# Patient Record
Sex: Male | Born: 1945
Health system: Southern US, Community
[De-identification: ages and names within clinical notes are randomized; demographics above are authoritative.]

## PROBLEM LIST (undated history)

## (undated) DIAGNOSIS — N529 Male erectile dysfunction, unspecified: Secondary | ICD-10-CM

## (undated) DIAGNOSIS — R001 Bradycardia, unspecified: Secondary | ICD-10-CM

## (undated) DIAGNOSIS — R0600 Dyspnea, unspecified: Secondary | ICD-10-CM

## (undated) DIAGNOSIS — M7500 Adhesive capsulitis of unspecified shoulder: Secondary | ICD-10-CM

## (undated) DIAGNOSIS — Z923 Personal history of irradiation: Secondary | ICD-10-CM

## (undated) DIAGNOSIS — I420 Dilated cardiomyopathy: Secondary | ICD-10-CM

## (undated) DIAGNOSIS — I255 Ischemic cardiomyopathy: Secondary | ICD-10-CM

## (undated) DIAGNOSIS — I4510 Unspecified right bundle-branch block: Secondary | ICD-10-CM

## (undated) DIAGNOSIS — N400 Enlarged prostate without lower urinary tract symptoms: Secondary | ICD-10-CM

## (undated) DIAGNOSIS — I2510 Atherosclerotic heart disease of native coronary artery without angina pectoris: Secondary | ICD-10-CM

## (undated) DIAGNOSIS — C3490 Malignant neoplasm of unspecified part of unspecified bronchus or lung: Secondary | ICD-10-CM

## (undated) DIAGNOSIS — I499 Cardiac arrhythmia, unspecified: Secondary | ICD-10-CM

## (undated) DIAGNOSIS — E785 Hyperlipidemia, unspecified: Secondary | ICD-10-CM

## (undated) DIAGNOSIS — G4733 Obstructive sleep apnea (adult) (pediatric): Secondary | ICD-10-CM

## (undated) DIAGNOSIS — I10 Essential (primary) hypertension: Secondary | ICD-10-CM

## (undated) DIAGNOSIS — F40241 Acrophobia: Secondary | ICD-10-CM

## (undated) HISTORY — PX: COLONOSCOPY: SHX174

## (undated) HISTORY — DX: Hyperlipidemia, unspecified: E78.5

## (undated) HISTORY — DX: Adhesive capsulitis of unspecified shoulder: M75.00

## (undated) HISTORY — DX: Personal history of irradiation: Z92.3

## (undated) HISTORY — DX: Essential (primary) hypertension: I10

## (undated) HISTORY — DX: Atherosclerotic heart disease of native coronary artery without angina pectoris: I25.10

## (undated) HISTORY — DX: Unspecified right bundle-branch block: I45.10

## (undated) HISTORY — DX: Ischemic cardiomyopathy: I25.5

## (undated) HISTORY — DX: Male erectile dysfunction, unspecified: N52.9

## (undated) HISTORY — DX: Acrophobia: F40.241

## (undated) HISTORY — DX: Dilated cardiomyopathy: I42.0

## (undated) HISTORY — DX: Bradycardia, unspecified: R00.1

## (undated) HISTORY — DX: Cardiac arrhythmia, unspecified: I49.9

## (undated) HISTORY — DX: Benign prostatic hyperplasia without lower urinary tract symptoms: N40.0

## (undated) HISTORY — PX: APPENDECTOMY: SHX54

## (undated) HISTORY — PX: CORONARY ARTERY BYPASS GRAFT: SHX141

## (undated) HISTORY — DX: Obstructive sleep apnea (adult) (pediatric): G47.33

---

## 2006-04-19 ENCOUNTER — Inpatient Hospital Stay (HOSPITAL_COMMUNITY): Admission: AD | Admit: 2006-04-19 | Discharge: 2006-04-26 | Payer: Self-pay | Admitting: Cardiology

## 2006-04-19 IMAGING — CR DG CHEST 1V PORT
1 series · 1 of 1 positions shown · non-contrast
Comparison: none

CLINICAL DATA: Unstable angina. Hypertensive crisis.
 PORTABLE CHEST - 1 VIEW ([VF] hours):

[view not recorded]
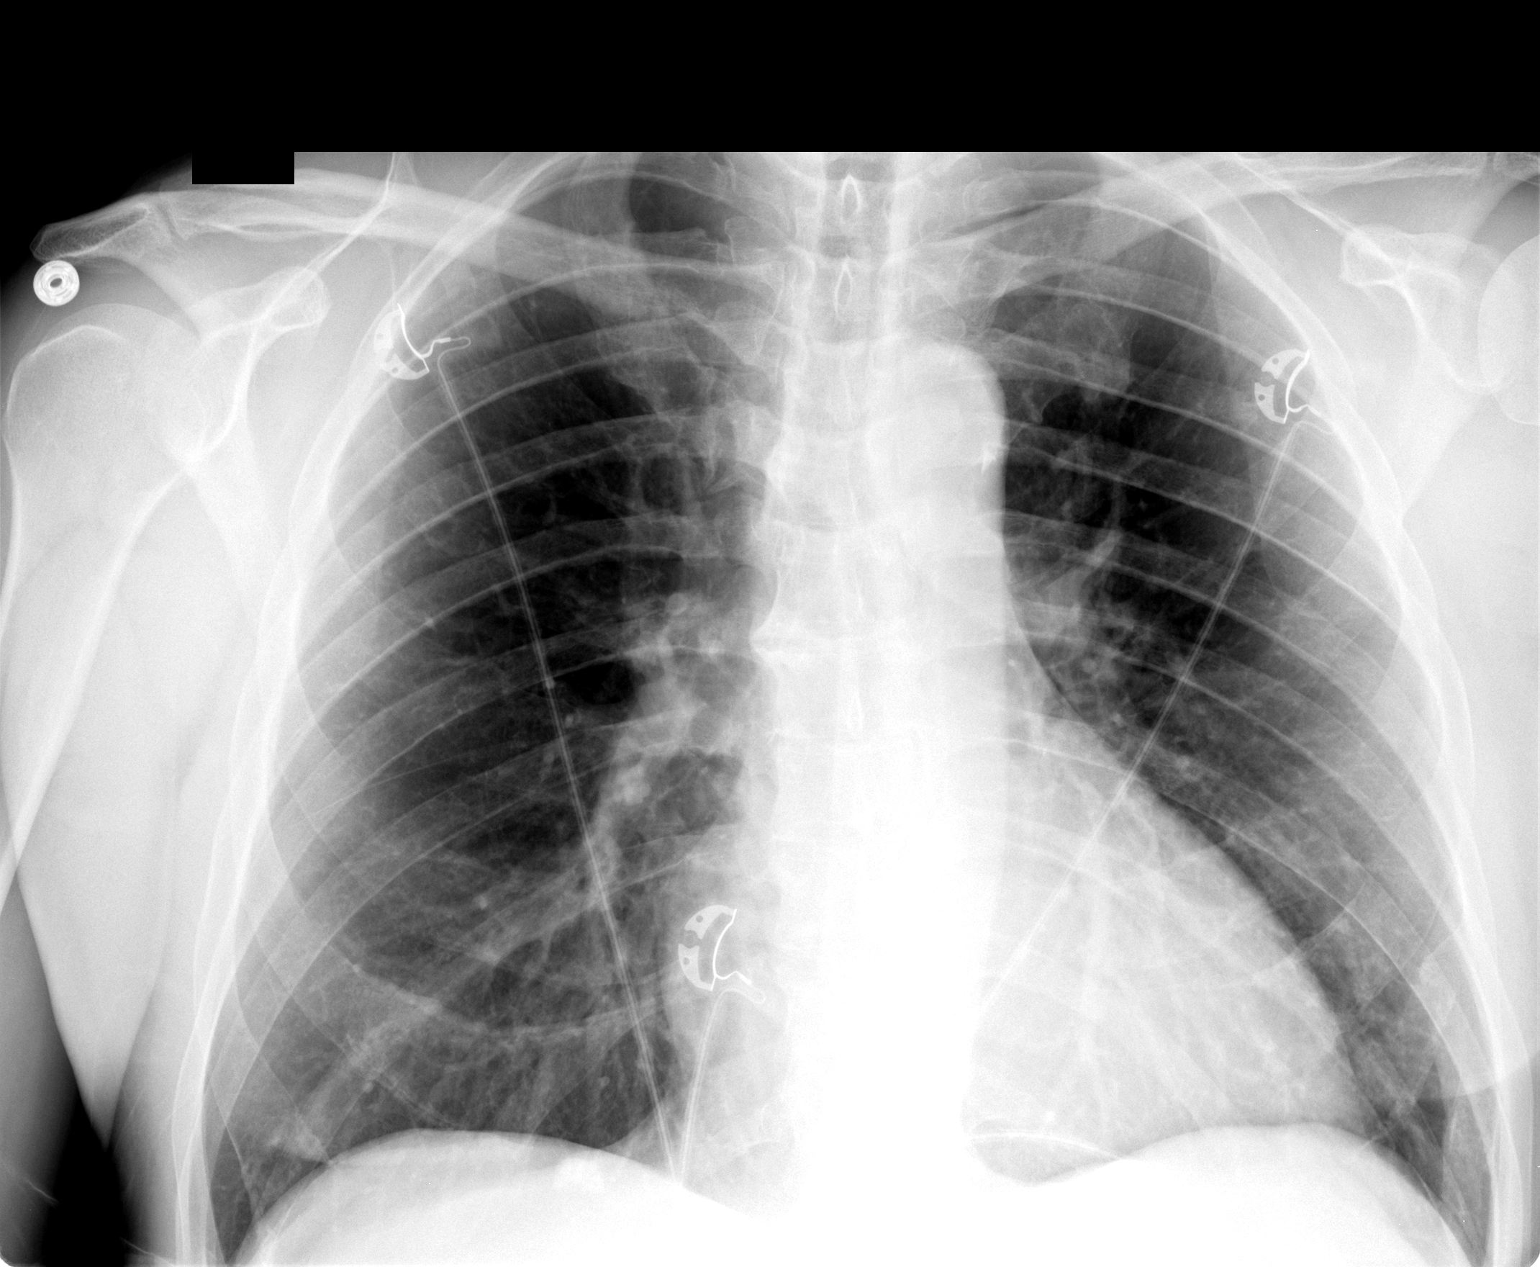

[1 of 1 positions shown; findings below may reference images not displayed]

FINDINGS: The heart is at the upper limits of normal in size.  The mediastinum is unremarkable. The lungs are clear. The vascularity is normal. No effusions. Artifact overlies the chest.
IMPRESSION: No active disease.

## 2006-04-21 IMAGING — CR DG CHEST 1V PORT
1 series · 1 of 1 positions shown · non-contrast
Comparison: [DATE].

CLINICAL DATA: Postop CABG.  
 PORTABLE CHEST - 1 VIEW ? [DATE] ([G1] HOURS):

[view not recorded]
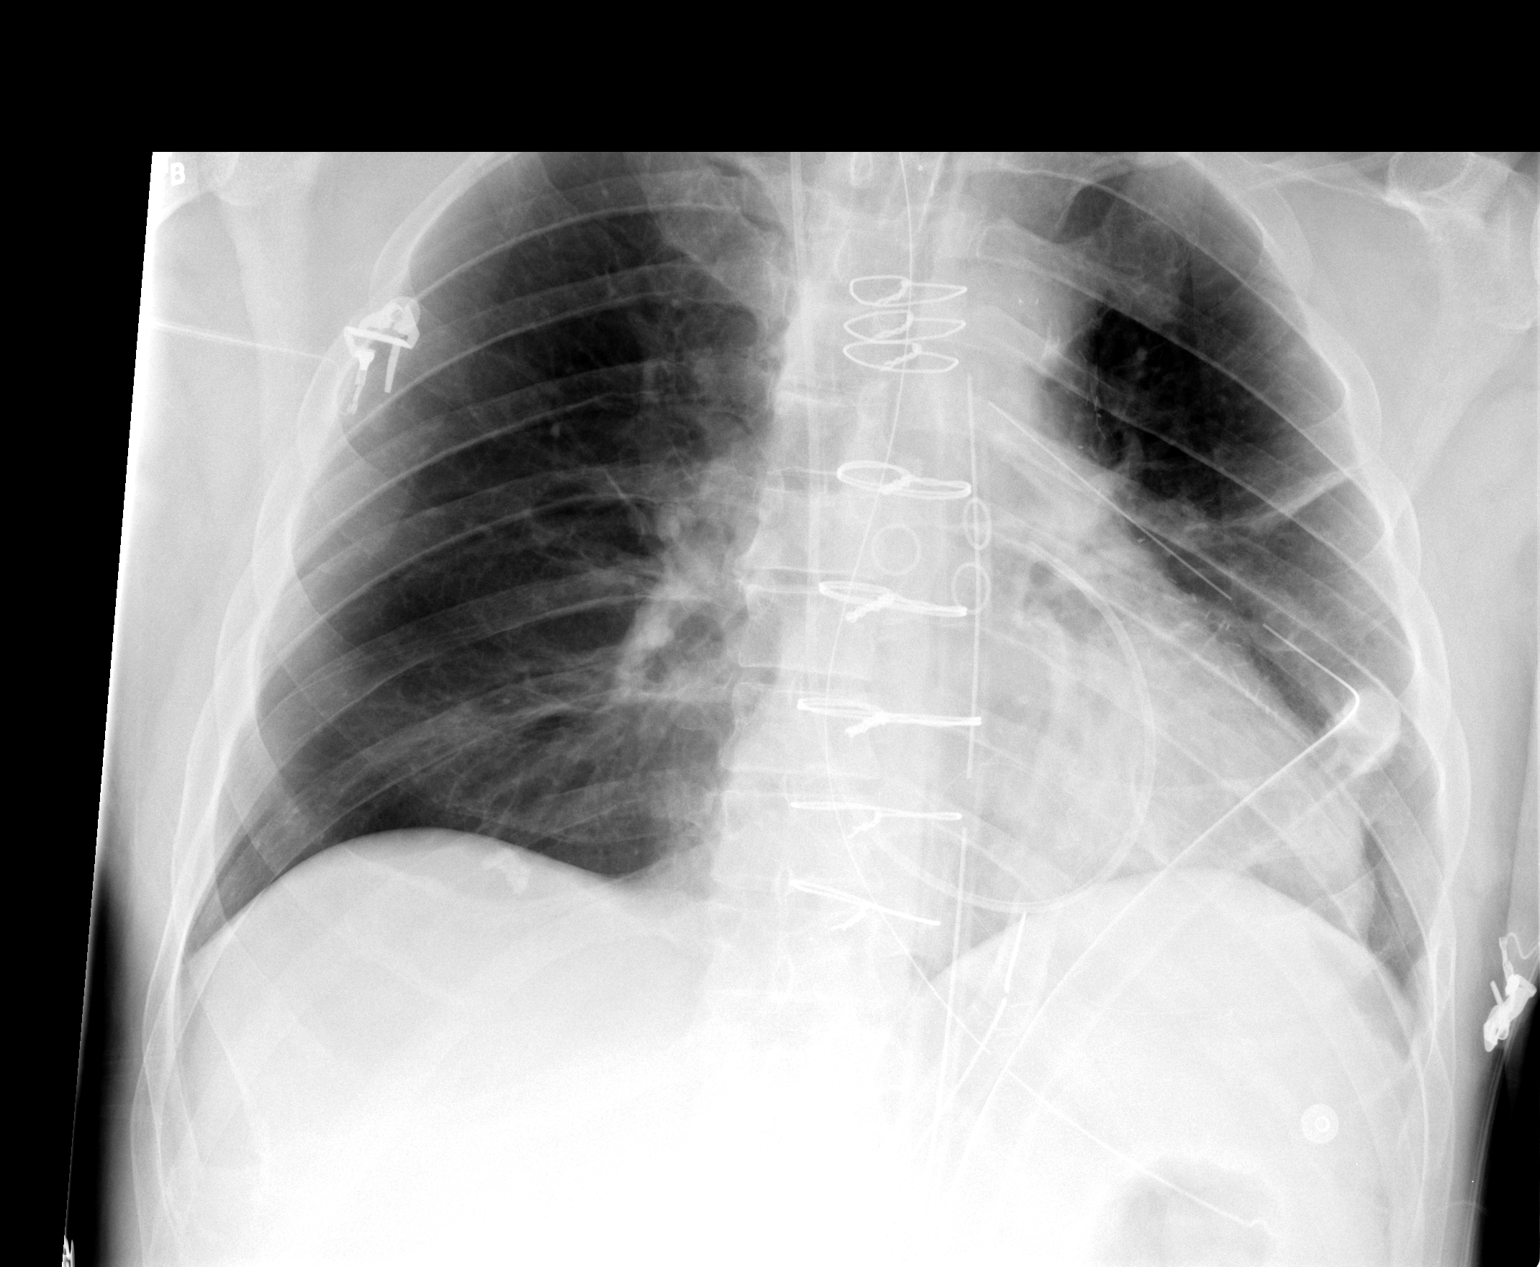

[1 of 1 positions shown; findings below may reference images not displayed]

FINDINGS: The patient has undergone interval median sternotomy and CABG.  Endotracheal tube tip is in the mid trachea.  There is a right IJ Swan-Ganz catheter with its tip in the main pulmonary artery.  Nasogastric tube and chest tubes are in place.  There is no pneumothorax.  Mild linear atelectasis is present in the left perihilar region.
IMPRESSION: No pneumothorax post CABG.  Mild left perihilar atelectasis.  Support system appears adequately positioned.

## 2006-04-23 IMAGING — CR DG CHEST 1V PORT
1 series · 1 of 1 positions shown · non-contrast
Comparison: [DATE], [DATE]. [DATE].

CLINICAL DATA: 60 year-old male, coronary artery disease status post bypass. Follow-up exam.
PORTABLE CHEST - 1 VIEW:

[view not recorded]
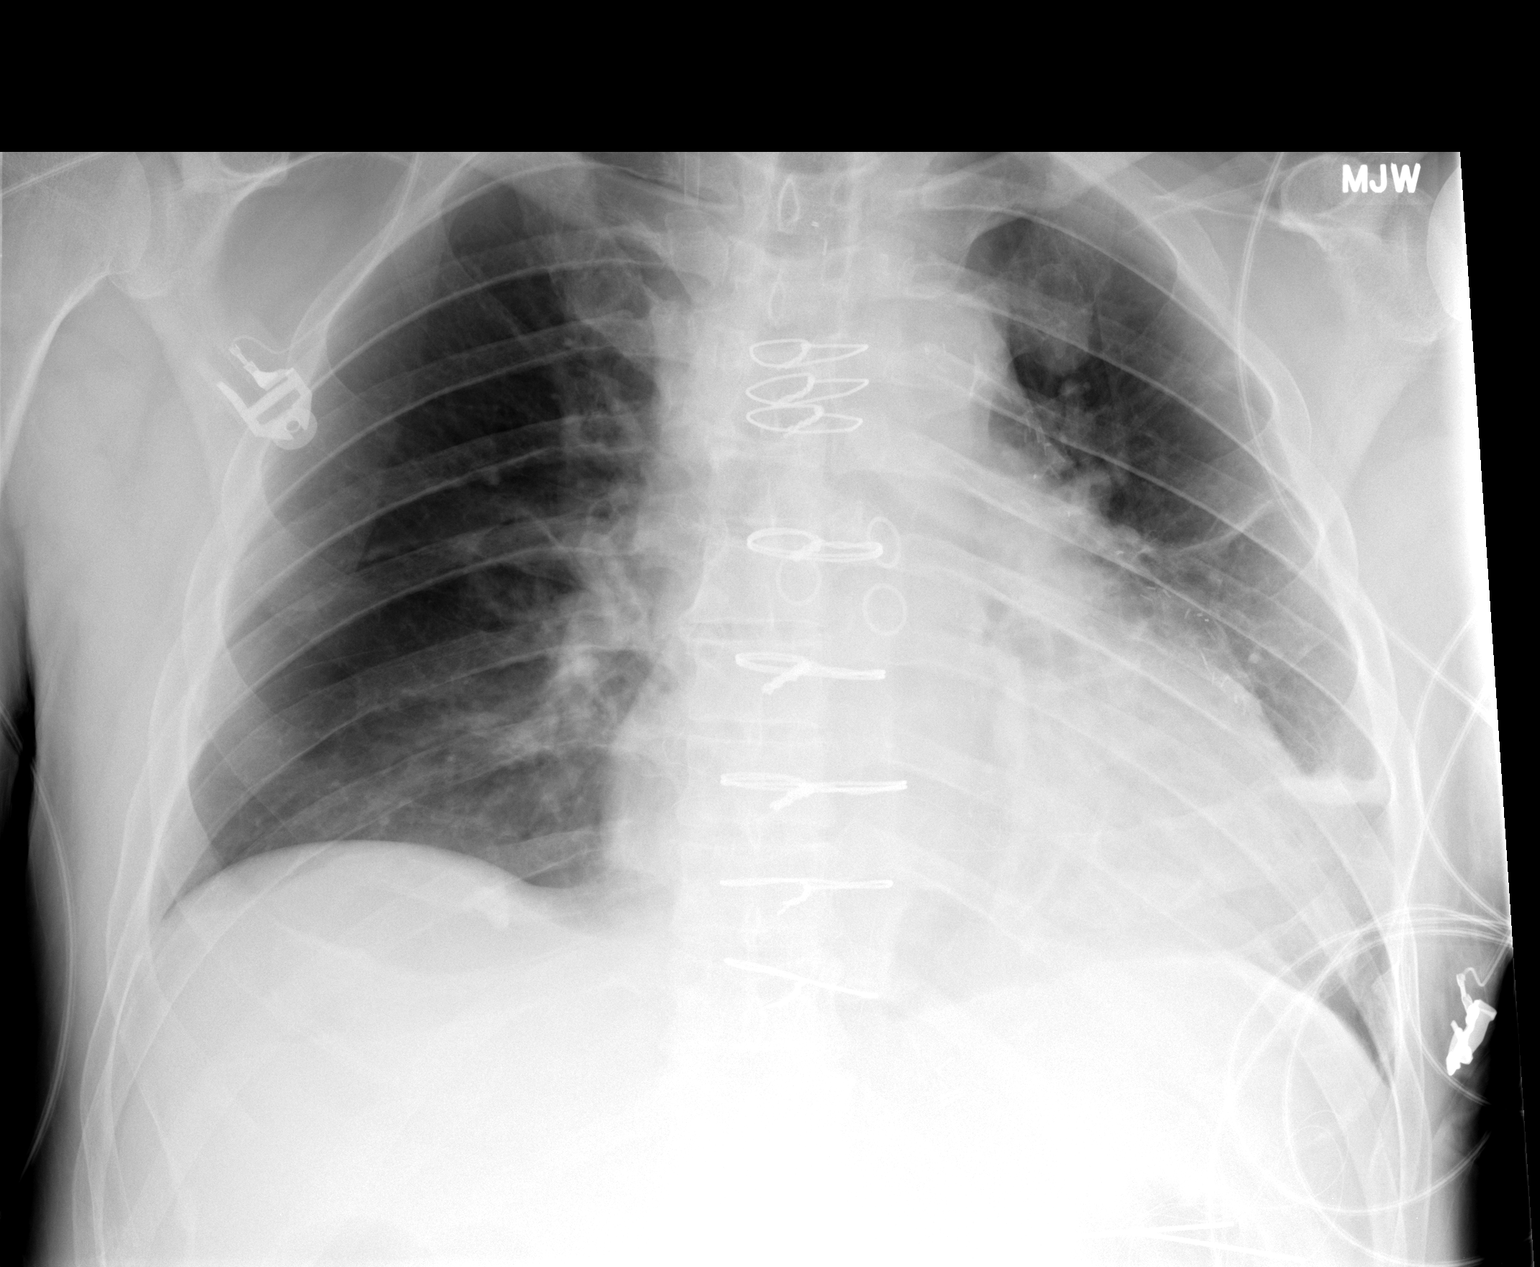

[1 of 1 positions shown; findings below may reference images not displayed]

FINDINGS: The Swan-Ganz catheter, mediastinal drain, and left chest tube have all been removed.  Right IJ vascular sheath persists.  The heart remains enlarged with vascular congestion and bibasilar atelectasis, worse on the left.  No pneumothorax.  Left fissural fluid is noted along the previous left chest tube tract.
IMPRESSION: 1.  Stable cardiomegaly.
2.  Bibasilar atelectasis persists.
3.  No pneumothorax.

## 2006-04-24 IMAGING — CR DG CHEST 1V PORT
1 series · 1 of 1 positions shown · non-contrast
Comparison: [DATE].

CLINICAL DATA: Status post CABG.
 PORTABLE CHEST ? 1 VIEW:

[view not recorded]
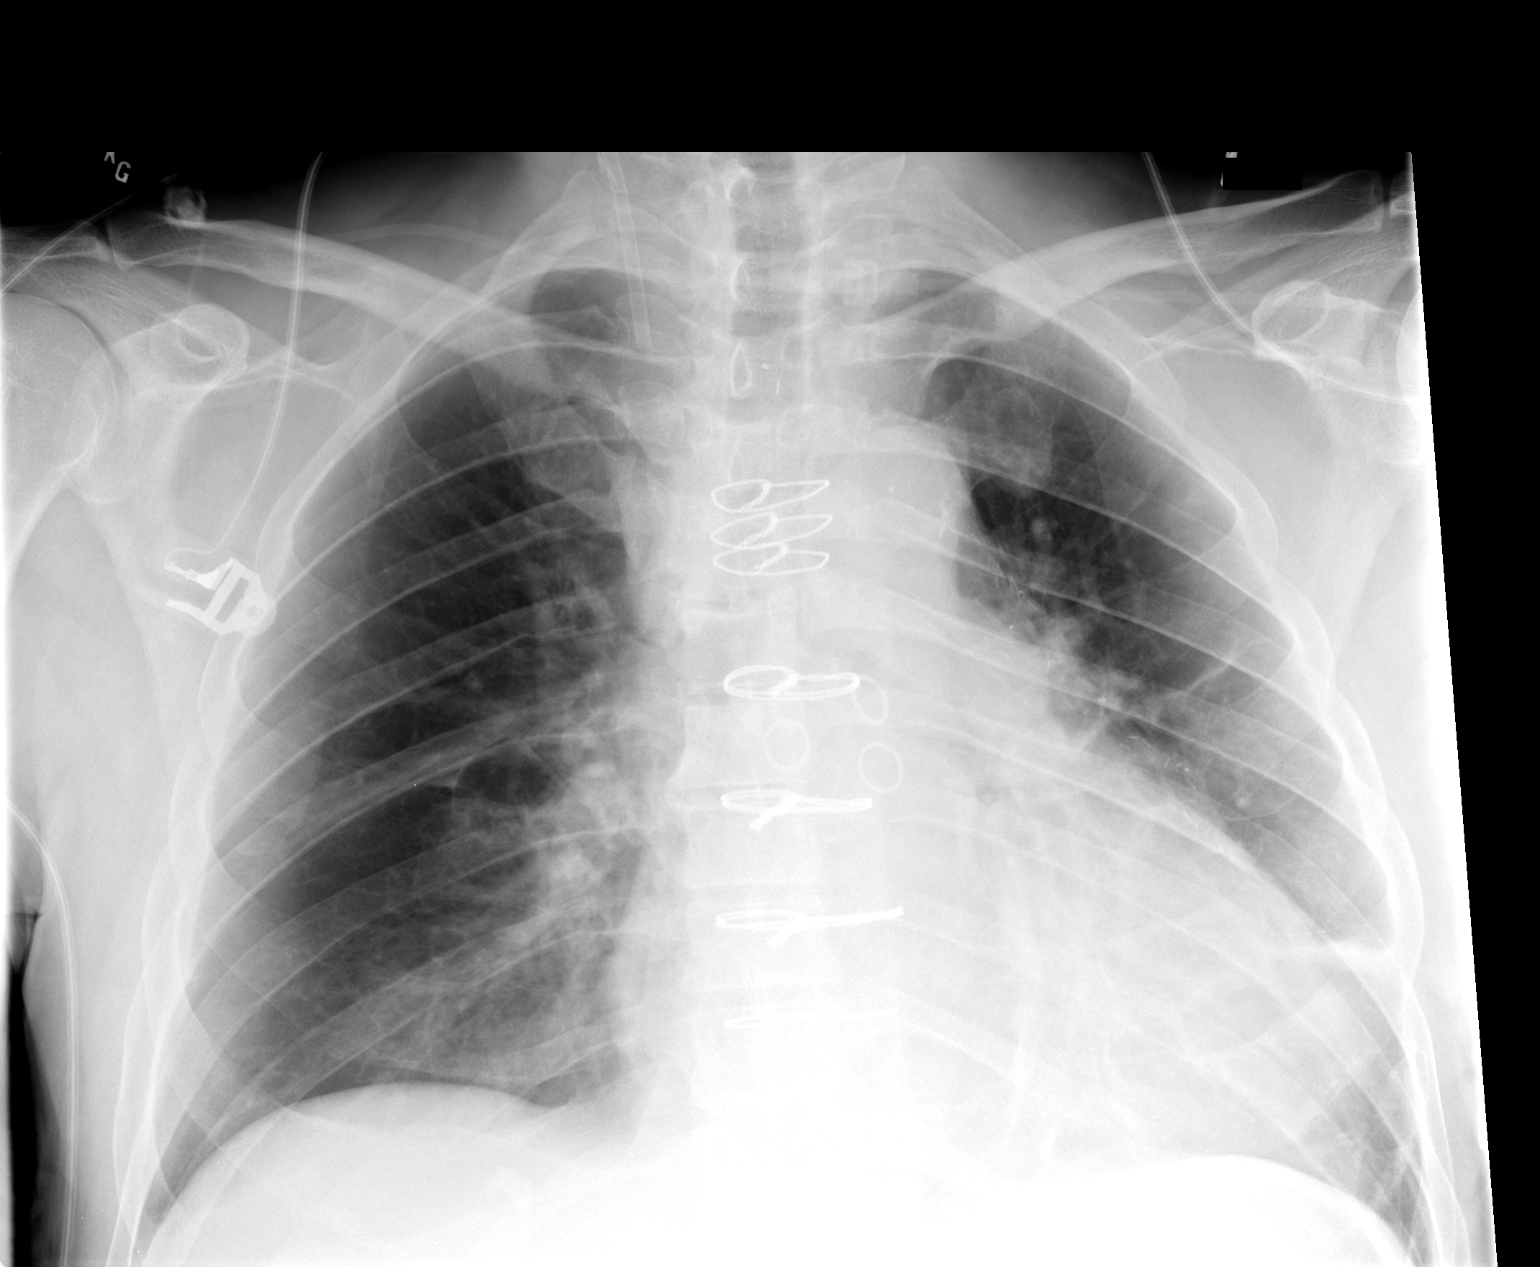

[1 of 1 positions shown; findings below may reference images not displayed]

FINDINGS: Right IJ sheath remains in place.  Cardiomegaly is stable with platelike left basilar atelectasis. Right lung is clear.
IMPRESSION: Left basilar atelectasis.

## 2006-04-25 IMAGING — CR DG CHEST 2V
2 series · 2 of 2 positions shown · non-contrast
Comparison: [DATE].

CLINICAL DATA: CABG, unstable angina.  
 PA AND LATERAL CHEST - 2 VIEW:

[w chest pa]
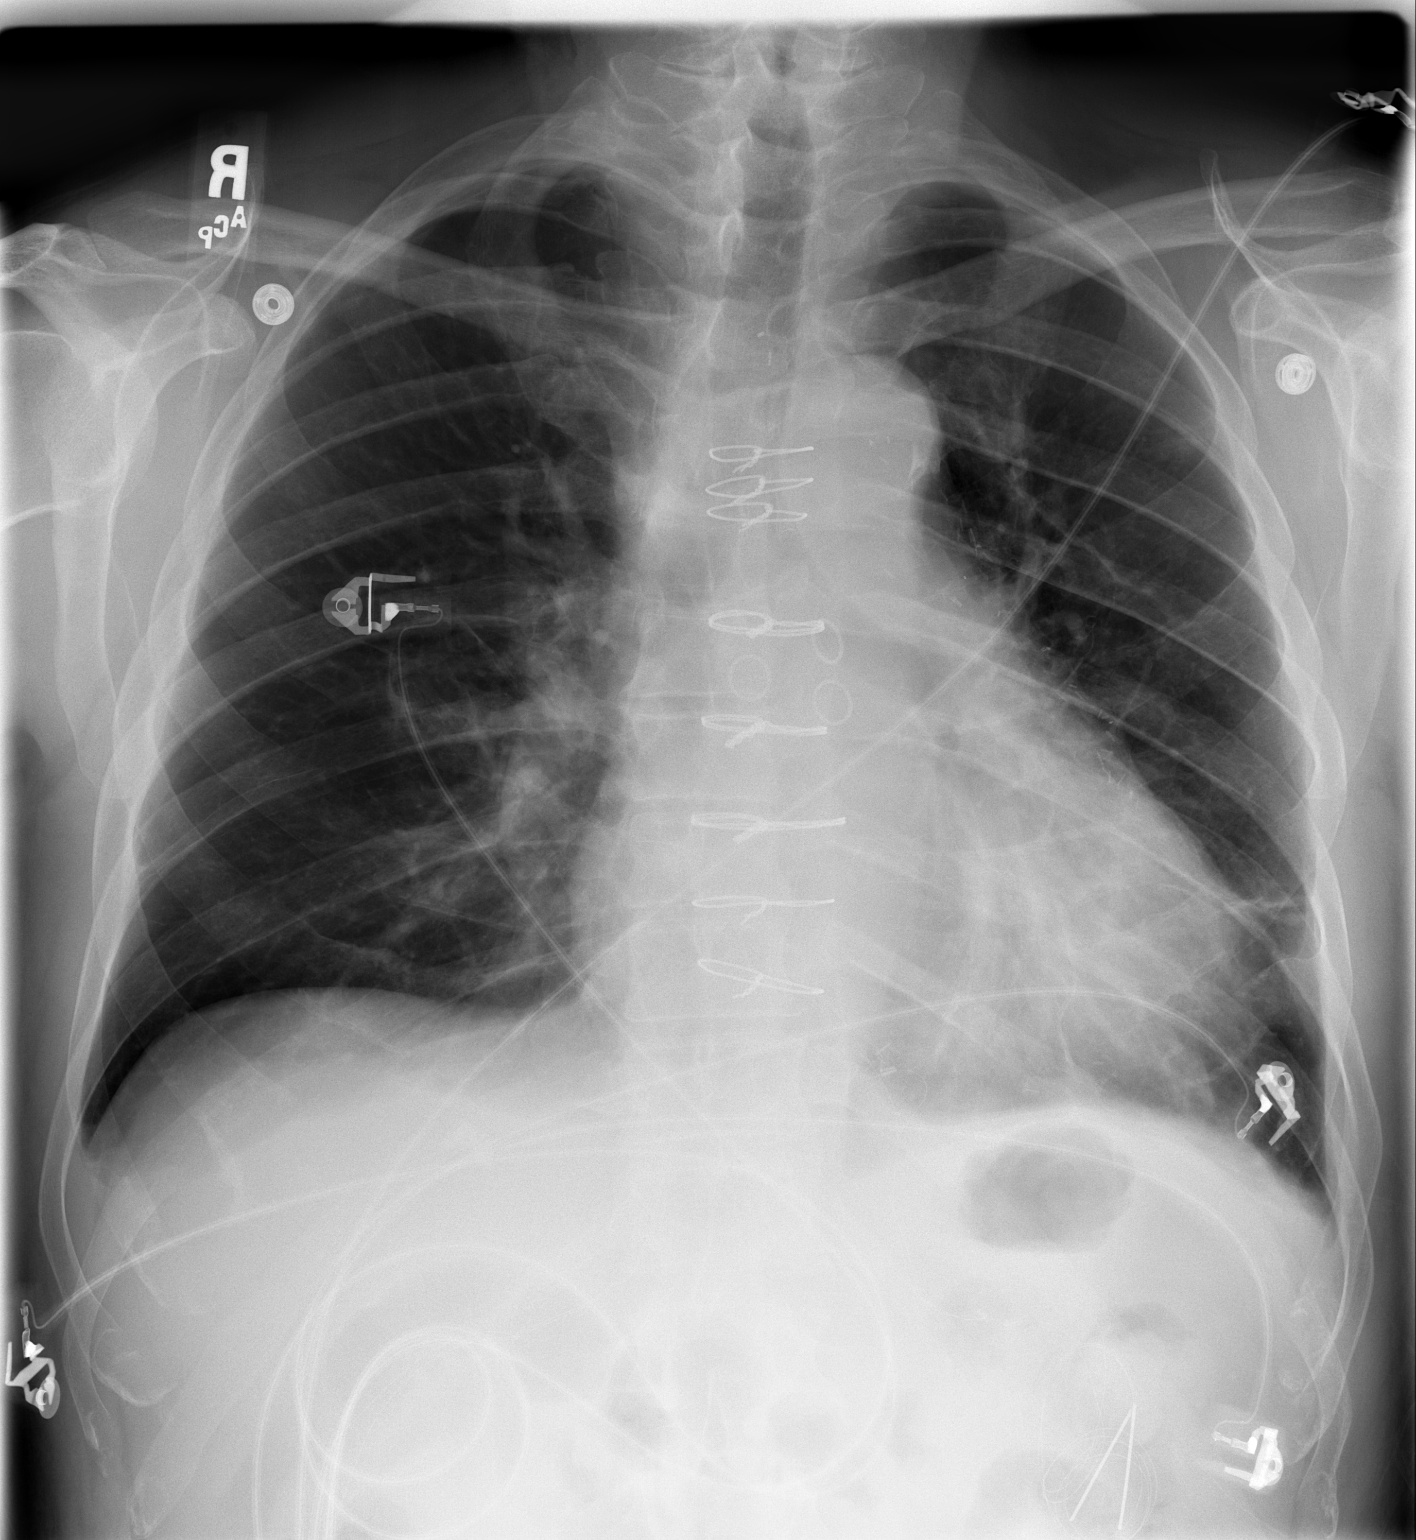

[w chest lat]
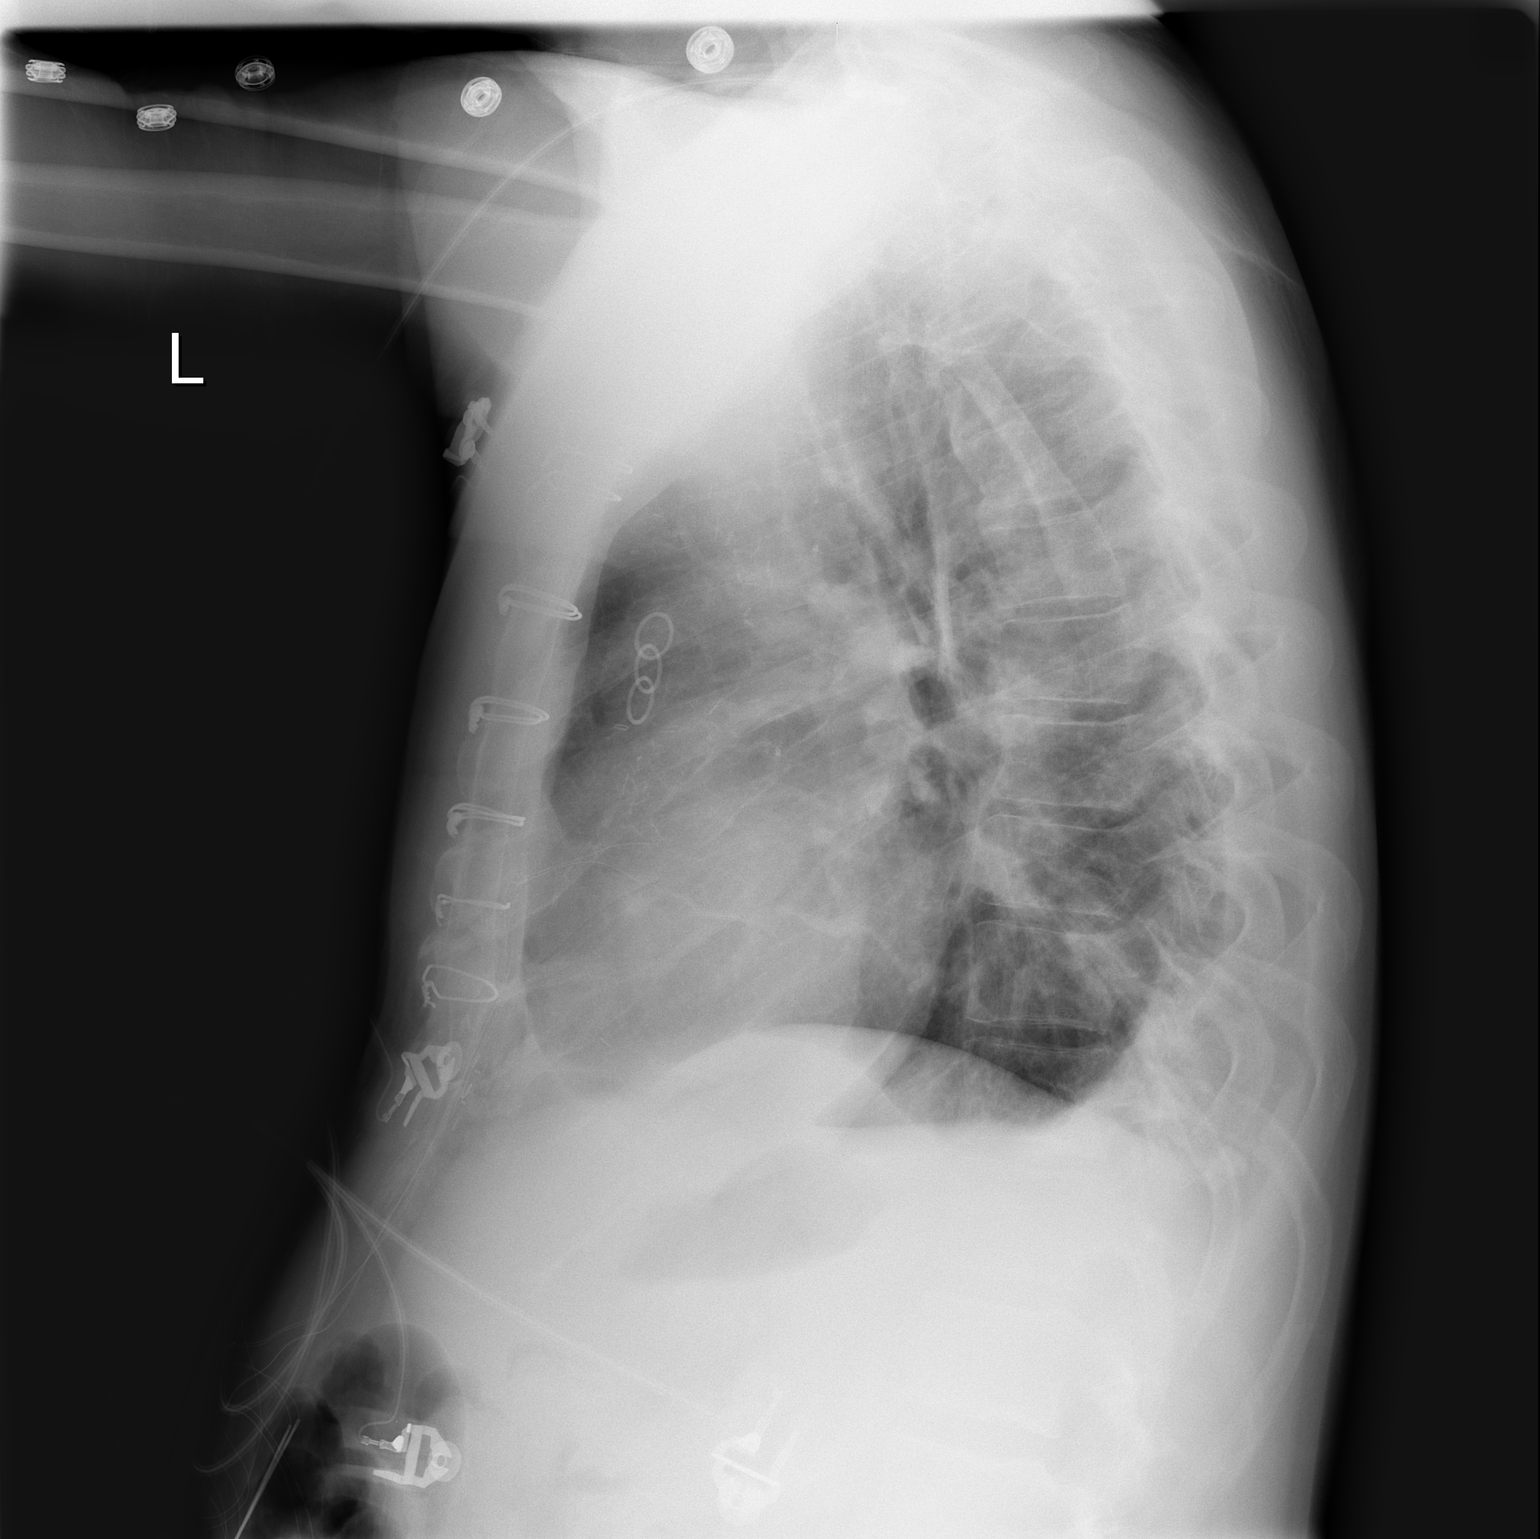

[2 of 2 positions shown; findings below may reference images not displayed]

FINDINGS: Right IJ sheath has been removed.  No pneumothorax.  Left basilar atelectasis has improved.
IMPRESSION: Improved left basilar atelectasis.

## 2006-05-13 ENCOUNTER — Encounter (HOSPITAL_COMMUNITY): Admission: RE | Admit: 2006-05-13 | Discharge: 2006-07-16 | Payer: Self-pay | Admitting: Cardiology

## 2012-11-28 DIAGNOSIS — Z79899 Other long term (current) drug therapy: Secondary | ICD-10-CM | POA: Diagnosis not present

## 2012-11-28 DIAGNOSIS — E7800 Pure hypercholesterolemia, unspecified: Secondary | ICD-10-CM | POA: Diagnosis not present

## 2013-03-22 DIAGNOSIS — H1045 Other chronic allergic conjunctivitis: Secondary | ICD-10-CM | POA: Diagnosis not present

## 2013-03-22 DIAGNOSIS — H01119 Allergic dermatitis of unspecified eye, unspecified eyelid: Secondary | ICD-10-CM | POA: Diagnosis not present

## 2013-04-04 DIAGNOSIS — Z1331 Encounter for screening for depression: Secondary | ICD-10-CM | POA: Diagnosis not present

## 2013-04-04 DIAGNOSIS — Z0000 Encounter for general adult medical examination without abnormal findings: Secondary | ICD-10-CM | POA: Diagnosis not present

## 2013-04-04 DIAGNOSIS — Z23 Encounter for immunization: Secondary | ICD-10-CM | POA: Diagnosis not present

## 2013-04-04 DIAGNOSIS — I10 Essential (primary) hypertension: Secondary | ICD-10-CM | POA: Diagnosis not present

## 2013-04-10 DIAGNOSIS — Z23 Encounter for immunization: Secondary | ICD-10-CM | POA: Diagnosis not present

## 2013-05-08 ENCOUNTER — Other Ambulatory Visit: Payer: Self-pay | Admitting: *Deleted

## 2013-05-08 DIAGNOSIS — Z79899 Other long term (current) drug therapy: Secondary | ICD-10-CM

## 2013-05-08 DIAGNOSIS — E785 Hyperlipidemia, unspecified: Secondary | ICD-10-CM

## 2013-05-19 ENCOUNTER — Other Ambulatory Visit: Payer: Self-pay | Admitting: Cardiology

## 2013-05-25 ENCOUNTER — Encounter: Payer: Self-pay | Admitting: Cardiology

## 2013-05-31 ENCOUNTER — Other Ambulatory Visit (INDEPENDENT_AMBULATORY_CARE_PROVIDER_SITE_OTHER): Payer: Medicare Other

## 2013-05-31 DIAGNOSIS — Z79899 Other long term (current) drug therapy: Secondary | ICD-10-CM | POA: Diagnosis not present

## 2013-05-31 DIAGNOSIS — E785 Hyperlipidemia, unspecified: Secondary | ICD-10-CM

## 2013-05-31 LAB — HEPATIC FUNCTION PANEL
ALT: 24 U/L (ref 0–53)
AST: 27 U/L (ref 0–37)
Alkaline Phosphatase: 51 U/L (ref 39–117)
Total Bilirubin: 0.9 mg/dL (ref 0.3–1.2)

## 2013-05-31 LAB — LIPID PANEL
HDL: 49.6 mg/dL (ref >39.00)
Total CHOL/HDL Ratio: 3
Triglycerides: 69 mg/dL (ref 0.0–149.0)

## 2013-06-01 ENCOUNTER — Encounter: Payer: Self-pay | Admitting: General Surgery

## 2013-06-01 ENCOUNTER — Telehealth: Payer: Self-pay | Admitting: General Surgery

## 2013-06-01 DIAGNOSIS — E785 Hyperlipidemia, unspecified: Secondary | ICD-10-CM

## 2013-06-01 DIAGNOSIS — Z79899 Other long term (current) drug therapy: Secondary | ICD-10-CM

## 2013-06-01 NOTE — Telephone Encounter (Signed)
Labs ordered and put on schedule for 06/01/14. Letter sent to pt to make aware.

## 2013-06-01 NOTE — Telephone Encounter (Signed)
Message copied by Nita Sells on Thu Jun 01, 2013  9:26 AM ------      Message from: Armanda Magic R      Created: Wed May 31, 2013  6:08 PM       Stable labs - continue current meds ------

## 2013-06-29 DIAGNOSIS — L821 Other seborrheic keratosis: Secondary | ICD-10-CM | POA: Diagnosis not present

## 2013-06-29 DIAGNOSIS — L259 Unspecified contact dermatitis, unspecified cause: Secondary | ICD-10-CM | POA: Diagnosis not present

## 2013-06-29 DIAGNOSIS — D235 Other benign neoplasm of skin of trunk: Secondary | ICD-10-CM | POA: Diagnosis not present

## 2013-07-05 DIAGNOSIS — Z1211 Encounter for screening for malignant neoplasm of colon: Secondary | ICD-10-CM | POA: Diagnosis not present

## 2013-08-18 ENCOUNTER — Encounter: Payer: Self-pay | Admitting: General Surgery

## 2013-08-18 DIAGNOSIS — I10 Essential (primary) hypertension: Secondary | ICD-10-CM | POA: Insufficient documentation

## 2013-08-18 DIAGNOSIS — I2510 Atherosclerotic heart disease of native coronary artery without angina pectoris: Secondary | ICD-10-CM

## 2013-08-18 DIAGNOSIS — E785 Hyperlipidemia, unspecified: Secondary | ICD-10-CM | POA: Insufficient documentation

## 2013-08-24 ENCOUNTER — Ambulatory Visit: Payer: Self-pay | Admitting: Cardiology

## 2013-08-29 ENCOUNTER — Encounter: Payer: Self-pay | Admitting: Cardiology

## 2013-08-29 ENCOUNTER — Ambulatory Visit (INDEPENDENT_AMBULATORY_CARE_PROVIDER_SITE_OTHER): Payer: Medicare Other | Admitting: Cardiology

## 2013-08-29 VITALS — BP 170/100 | HR 52 | Ht 71.0 in | Wt 205.0 lb

## 2013-08-29 DIAGNOSIS — I2510 Atherosclerotic heart disease of native coronary artery without angina pectoris: Secondary | ICD-10-CM

## 2013-08-29 DIAGNOSIS — I498 Other specified cardiac arrhythmias: Secondary | ICD-10-CM

## 2013-08-29 DIAGNOSIS — E785 Hyperlipidemia, unspecified: Secondary | ICD-10-CM

## 2013-08-29 DIAGNOSIS — I4510 Unspecified right bundle-branch block: Secondary | ICD-10-CM | POA: Insufficient documentation

## 2013-08-29 DIAGNOSIS — R001 Bradycardia, unspecified: Secondary | ICD-10-CM

## 2013-08-29 DIAGNOSIS — I10 Essential (primary) hypertension: Secondary | ICD-10-CM

## 2013-08-29 MED ORDER — AMLODIPINE BESYLATE 5 MG PO TABS: 5.0000 mg | ORAL_TABLET | Freq: Every day | ORAL | Status: DC

## 2013-08-29 NOTE — Patient Instructions (Signed)
Your physician has recommended you make the following change in your medication: 1. Start Amlodipine 5 MG Daily  Your physician has requested that you regularly monitor and record your blood pressure readings at home. Please use the same machine at the same time of day to check your readings and record them for one week and call in your results.  Your physician wants you to follow-up in: 6 Months with Dr Mallie Snooks will receive a reminder letter in the mail two months in advance. If you don't receive a letter, please call our office to schedule the follow-up appointment.

## 2013-08-29 NOTE — Progress Notes (Signed)
[facility], [address] [place], [place]  [address] Phone: [phone] Fax:  [phone]  Date:  08/29/2013   ID:  John Hinton, DOB July 10, 1946, MRN [id number]  PCP:  Irven Shelling, MD  Cardiologist:  Fransico Him, MD     History of Present Illness: John Hinton is a 68 y.o. male with a history of ASCAD s/p CABG, HTN and dyslipidemia who presents today for followup.  He is doing well.  He denies any chest pain, SOB, DOE, LE edema, dizziness, palpitations or syncope.  He checks his BP at home and runs around 140-160/47mmHg.  He exercises at the gym 3 times a week riding the bike and the elliptical.   Wt Readings from Last 3 Encounters:  08/29/13 205 lb (92.987 kg)     Past Medical History  Diagnosis Date  . Hypertension   . Dyslipidemia   . ED (erectile dysfunction)   . Shoulder, capsulitis, adhesive     Left Shoulder  . BPH (benign prostatic hypertrophy)   . Arrhythmia     Post-op  . Fear of heights   . Coronary artery disease     s/p CABG with LIMA to LAD, SVG seq to OM1 and OM2, SVG to PDA and SVG to diag  . RBBB     Current Outpatient Prescriptions  Medication Sig Dispense Refill  . ALPRAZolam (XANAX) 0.5 MG tablet Take 0.5 mg by mouth 2 (two) times daily as needed for anxiety.      Marland Kitchen aspirin 81 MG tablet Take 81 mg by mouth daily.      . cholecalciferol (VITAMIN D) 1000 UNITS tablet Take 1,000 Units by mouth daily.      Marland Kitchen ezetimibe (ZETIA) 10 MG tablet Take 10 mg by mouth daily.      . folic acid (FOLVITE) 379 MCG tablet Take 400 mcg by mouth daily.      . metoprolol tartrate (LOPRESSOR) 25 MG tablet Take 25 mg by mouth 2 (two) times daily.      . Multiple Vitamin (MULTIVITAMIN) tablet Take 1 tablet by mouth daily.      . nitroGLYCERIN (NITROSTAT) 0.4 MG SL tablet Place 0.4 mg under the tongue every 5 (five) minutes as needed for chest pain.      . ramipril (ALTACE) 10 MG capsule TAKE ONE CAPSULE BY MOUTH DAILY  90 capsule  0  . rosuvastatin  (CRESTOR) 40 MG tablet Take 40 mg by mouth daily.       No current facility-administered medications for this visit.    Allergies:   No Known Allergies  Social History:  The patient  reports that he quit smoking about 30 years ago. His smoking use included Cigarettes. He smoked 0.00 packs per day. He does not have any smokeless tobacco history on file. He reports that he drinks about 1.2 ounces of alcohol per week. He reports that he does not use illicit drugs.   Family History:  The patient's family history includes Cancer in his sister; Heart disease in his father; Heart failure in his father; Lung cancer in his father and mother.   ROS:  Please see the history of present illness.      All other systems reviewed and negative.   PHYSICAL EXAM: VS:  BP 170/100  Pulse 52  Ht 5\' 11"  (1.803 m)  Wt 205 lb (92.987 kg)  BMI 28.60 kg/m2 Well nourished, well developed, in no acute distress HEENT: normal Neck: no JVD Cardiac:  normal S1, S2; RRR;  no murmur Lungs:  clear to auscultation bilaterally, no wheezing, rhonchi or rales Abd: soft, nontender, no hepatomegaly Ext: no edema Skin: warm and dry Neuro:  CNs 2-12 intact, no focal abnormalities noted  EKG:  Sinus bradycardia at 52bpm with RBBB     ASSESSMENT AND PLAN:  1. ASCAD with no angina  - continue ASA 2. HTN - poorly controlled - it runs slightly higher at home but not as high as today  - continue metoprolol/Altace  - add amlodipine 5mg  daily  - I have asked him to check his BP daily for a week and call with the results 3. Dyslipidemia - at goal with LDL 62 on lipids checked 07/2013  - continue Crestor/zetia 4. Chronic RBBB 5. Asymptomatic bradycardia                           Followup with me in 1 year  Signed, Fransico Him, MD 08/29/2013 10:05 AM

## 2013-09-03 DIAGNOSIS — R509 Fever, unspecified: Secondary | ICD-10-CM | POA: Diagnosis not present

## 2013-09-03 DIAGNOSIS — A088 Other specified intestinal infections: Secondary | ICD-10-CM | POA: Diagnosis not present

## 2013-09-07 ENCOUNTER — Telehealth: Payer: Self-pay | Admitting: Cardiology

## 2013-09-07 MED ORDER — AMLODIPINE BESYLATE 5 MG PO TABS: 7.5000 mg | ORAL_TABLET | Freq: Every day | ORAL | Status: DC

## 2013-09-07 NOTE — Telephone Encounter (Signed)
To Dr Turner to advise 

## 2013-09-07 NOTE — Telephone Encounter (Signed)
BP still too high - increase Amlodipine to 7.5mg  daily and recheck BP daily for a week and call with the results

## 2013-09-07 NOTE — Telephone Encounter (Signed)
New Prob   Pt calling to report BP readings:   2/10 9 AM 150/75 7 PM No reading  2/11 9 AM 153/79 131/75  2/12 9 AM 164/83 7 PM 135/70  2/13 9 AM 155/88 7 PM 142/71  2/14 9 AM 169/84 7 PM 159/76  2/15 9 AM 141/86 7 PM 132/76  2/16 9 AM 128/71 7 PM 139/74  2/17 9 AM 143/79 7 PM 145/72  2/18 9 AM 140/77 7 PM 144/69

## 2013-09-07 NOTE — Telephone Encounter (Signed)
Pt is aware and new rx sent in. I also called pharmacy to make aware.

## 2013-09-26 ENCOUNTER — Telehealth: Payer: Self-pay | Admitting: Cardiology

## 2013-09-26 NOTE — Telephone Encounter (Signed)
New message   Patient calling in with blood pressure reading 2/22- 3/10 . In between was a 5 day vacation.

## 2013-09-26 NOTE — Telephone Encounter (Signed)
Lmtcb/ ask for triage.

## 2013-09-26 NOTE — Telephone Encounter (Signed)
Follow up        bp readings: 2/22 am 146/79 pm 132/66 2/23 am 125/70 pm 133/63 2/24 am 145/76 pm 128/66 2/25 am 138/70 pm 130/78 2/26 am 149/73 pm 126/66 2/27 am 148/77 pm 134/79  PT WAS ON VACATION FOR 5 DAYS  3/5 am none    pm 136/74 3/6 am 145/78 pm 152/82 3/7 am 133/74 pm 137/71 3/8 am 125/75 pm 139/78 3/9 am 139/78 pm 140/73 3/10 am 137/81 pm not done yet

## 2013-09-26 NOTE — Telephone Encounter (Signed)
Left message for pt after Dr Theodosia Blender review he is to continue current medications and to call back if further questions.

## 2013-09-26 NOTE — Telephone Encounter (Signed)
BP for the most part is ok - continue current meds

## 2013-12-05 ENCOUNTER — Encounter: Payer: Self-pay | Admitting: Cardiology

## 2013-12-06 ENCOUNTER — Other Ambulatory Visit: Payer: Self-pay | Admitting: General Surgery

## 2013-12-06 ENCOUNTER — Telehealth: Payer: Self-pay | Admitting: General Surgery

## 2013-12-06 DIAGNOSIS — Z79899 Other long term (current) drug therapy: Secondary | ICD-10-CM

## 2013-12-06 MED ORDER — RAMIPRIL 10 MG PO CAPS: ORAL_CAPSULE | ORAL | Status: DC

## 2013-12-06 MED ORDER — RAMIPRIL 5 MG PO CAPS: 5.0000 mg | ORAL_CAPSULE | Freq: Every day | ORAL | Status: DC

## 2013-12-06 NOTE — Telephone Encounter (Signed)
Per pt Email  I have been taking Amlodipine Besylate 7.5 MG. For the past month, my ankles have been swelling. Sometimes as much that I can't see my ankle bone. It "irritates" me in the evening and some nights. Is this normal?  To Dr [name] to advise

## 2013-12-06 NOTE — Telephone Encounter (Signed)
Pt is aware of med change and also lab

## 2013-12-06 NOTE — Telephone Encounter (Signed)
Please have him stop amlodipine and increase Altace to 15mg  daily.  Check BP daily for a week and call with results.  Please have patient call if his LE edema does not resolve off of amlodipine.  Needs BMET check in 1 week

## 2013-12-06 NOTE — Telephone Encounter (Signed)
Pt is aware. Labs ordered.

## 2013-12-06 NOTE — Telephone Encounter (Signed)
LVM for pt to return call

## 2013-12-14 ENCOUNTER — Other Ambulatory Visit (INDEPENDENT_AMBULATORY_CARE_PROVIDER_SITE_OTHER): Payer: Medicare Other

## 2013-12-14 ENCOUNTER — Telehealth: Payer: Self-pay | Admitting: Cardiology

## 2013-12-14 DIAGNOSIS — Z79899 Other long term (current) drug therapy: Secondary | ICD-10-CM

## 2013-12-14 LAB — BASIC METABOLIC PANEL
BUN: 17 mg/dL (ref 6–23)
CHLORIDE: 107 meq/L (ref 96–112)
CO2: 29 mEq/L (ref 19–32)
Calcium: 9.3 mg/dL (ref 8.4–10.5)
Creatinine, Ser: 1.1 mg/dL (ref 0.4–1.5)
GFR: 71.94 mL/min (ref >60.00)
Glucose, Bld: 93 mg/dL (ref 70–99)
POTASSIUM: 3.9 meq/L (ref 3.5–5.1)
Sodium: 142 mEq/L (ref 135–145)

## 2013-12-14 NOTE — Telephone Encounter (Signed)
Left message to call back  

## 2013-12-14 NOTE — Telephone Encounter (Signed)
Patients BP readings:  12/07/13- 145/79      125/69 12/08/13   137/78      130/68 12/09/13 - 135/75     128/66 12/10/13 - 135/70      117/64 12/11/13 - 138/76      130/76 12/12/13 -  152/83      132/77 12/13/13 -  150/83      125/74 Ankles are not swelling anymore Please call and advise

## 2013-12-14 NOTE — Telephone Encounter (Signed)
Patient is returning your call. Please call back.  °

## 2013-12-15 NOTE — Telephone Encounter (Signed)
Left message at patient request

## 2013-12-15 NOTE — Telephone Encounter (Signed)
Left message to call back  

## 2013-12-15 NOTE — Telephone Encounter (Signed)
Will forward to Dr [name] for review

## 2013-12-15 NOTE — Telephone Encounter (Signed)
BP is well controlled - continue current medical regimen

## 2014-01-30 ENCOUNTER — Telehealth: Payer: Self-pay | Admitting: General Surgery

## 2014-01-30 ENCOUNTER — Encounter: Payer: Self-pay | Admitting: Cardiology

## 2014-01-30 NOTE — Telephone Encounter (Signed)
Pt wants to know if they should use Tylenol or Ibuprofen.   To Dr [name] to advise.

## 2014-03-23 ENCOUNTER — Encounter: Payer: Self-pay | Admitting: Cardiology

## 2014-03-23 ENCOUNTER — Ambulatory Visit (INDEPENDENT_AMBULATORY_CARE_PROVIDER_SITE_OTHER): Payer: Medicare Other | Admitting: Cardiology

## 2014-03-23 VITALS — BP 140/92 | HR 60 | Ht 71.0 in | Wt 203.0 lb

## 2014-03-23 DIAGNOSIS — I10 Essential (primary) hypertension: Secondary | ICD-10-CM | POA: Diagnosis not present

## 2014-03-23 DIAGNOSIS — I2510 Atherosclerotic heart disease of native coronary artery without angina pectoris: Secondary | ICD-10-CM | POA: Diagnosis not present

## 2014-03-23 DIAGNOSIS — E785 Hyperlipidemia, unspecified: Secondary | ICD-10-CM | POA: Diagnosis not present

## 2014-03-23 DIAGNOSIS — I4510 Unspecified right bundle-branch block: Secondary | ICD-10-CM | POA: Diagnosis not present

## 2014-03-23 MED ORDER — ROSUVASTATIN CALCIUM 40 MG PO TABS: 40.0000 mg | ORAL_TABLET | Freq: Every day | ORAL | Status: DC

## 2014-03-23 MED ORDER — EZETIMIBE 10 MG PO TABS: 10.0000 mg | ORAL_TABLET | Freq: Every day | ORAL | Status: DC

## 2014-03-23 MED ORDER — RAMIPRIL 10 MG PO CAPS: 20.0000 mg | ORAL_CAPSULE | Freq: Every day | ORAL | Status: DC

## 2014-03-23 NOTE — Patient Instructions (Signed)
Your physician has recommended you make the following change in your medication: 1. Increase Ramipril to 20 MG daily (take two 10 MG tablets daily) Rx has been sent into your pharmacy for you.  Your physician recommends that you return for lab work on 03/30/14 for Fasting Blood Work  Your physician wants you to follow-up in: 1 Year with Dr Mallie Snooks will receive a reminder letter in the mail two months in advance. If you don't receive a letter, please call our office to schedule the follow-up appointment.

## 2014-03-23 NOTE — Progress Notes (Signed)
[facility], [place], [place]  [address] Phone: [phone] Fax:  [phone]  Date:  03/23/2014   ID:  John Hinton, DOB 06/13/1946, MRN [id number]  PCP:  Irven Shelling, MD  Cardiologist:  Fransico Him, MD     History of Present Illness: John Hinton is a 68 y.o. male with a history of ASCAD s/p CABG, HTN and dyslipidemia who presents today for followup. He is doing well. He denies any chest pain, SOB, DOE, LE edema, dizziness, palpitations or syncope.  He occasionally has a discomfort in his chest that is due to reflux and resolves with [place].   He checks his BP at home and runs around 135-145/85-58mmHg. He exercises at the gym 3 times a week riding the bike and the elliptical. He also walks outside.    Wt Readings from Last 3 Encounters:  03/23/14 203 lb (92.08 kg)  08/29/13 205 lb (92.987 kg)     Past Medical History  Diagnosis Date  . Hypertension   . Dyslipidemia   . ED (erectile dysfunction)   . Shoulder, capsulitis, adhesive     Left Shoulder  . BPH (benign prostatic hypertrophy)   . Arrhythmia     Post-op  . Fear of heights   . Coronary artery disease     s/p CABG with LIMA to LAD, SVG seq to OM1 and OM2, SVG to PDA and SVG to diag  . RBBB     Current Outpatient Prescriptions  Medication Sig Dispense Refill  . ALPRAZolam (XANAX) 0.5 MG tablet Take 0.5 mg by mouth 2 (two) times daily as needed for anxiety.      Marland Kitchen aspirin 81 MG tablet Take 81 mg by mouth daily.      . cholecalciferol (VITAMIN D) 1000 UNITS tablet Take 1,000 Units by mouth daily.      Marland Kitchen ezetimibe (ZETIA) 10 MG tablet Take 10 mg by mouth daily.      . folic acid (FOLVITE) 696 MCG tablet Take 400 mcg by mouth daily.      . metoprolol tartrate (LOPRESSOR) 25 MG tablet Take 25 mg by mouth 2 (two) times daily.      . Multiple Vitamin (MULTIVITAMIN) tablet Take 1 tablet by mouth daily.      . nitroGLYCERIN (NITROSTAT) 0.4 MG SL tablet Place 0.4 mg under the tongue every  5 (five) minutes as needed for chest pain.      . ramipril (ALTACE) 10 MG capsule Take one capsule daily with the 5 MG capsule to equal 15 MG daily.  90 capsule  0  . ramipril (ALTACE) 5 MG capsule Take 1 capsule (5 mg total) by mouth daily.  90 capsule  3  . rosuvastatin (CRESTOR) 40 MG tablet Take 40 mg by mouth daily.       No current facility-administered medications for this visit.    Allergies:   No Known Allergies  Social History:  The patient  reports that he quit smoking about 30 years ago. His smoking use included Cigarettes. He smoked 0.00 packs per day. He does not have any smokeless tobacco history on file. He reports that he drinks about 1.2 ounces of alcohol per week. He reports that he does not use illicit drugs.   Family History:  The patient's family history includes Cancer in his sister; Heart disease in his father; Heart failure in his father; Lung cancer in his father and mother.   ROS:  Please see the history of present illness.  All other systems reviewed and negative.   PHYSICAL EXAM: VS:  BP 140/92  Pulse 60  Ht 5\' 11"  (1.803 m)  Wt 203 lb (92.08 kg)  BMI 28.33 kg/m2 Well nourished, well developed, in no acute distress HEENT: normal Neck: no JVD Cardiac:  normal S1, S2; RRR; no murmur Lungs:  clear to auscultation bilaterally, no wheezing, rhonchi or rales Abd: soft, nontender, no hepatomegaly Ext: no edema Skin: warm and dry Neuro:  CNs 2-12 intact, no focal abnormalities noted  ASSESSMENT AND PLAN:  1. ASCAD s/p CABG with no angina - continue ASA        2.  HTN - borderline controlled  - continue metoprolol/Altace - increase Altace to 20mg  daily - check BMET in 1 week 3. Dyslipidemia - at goal with LDL 62  - continue Crestor/zetia  - recheck FLP and ALT 4. Chronic RBBB 5. Asymptomatic bradycardia   Followup with me in 1 year       Signed, Fransico Him, MD 03/23/2014 9:00 AM

## 2014-03-30 ENCOUNTER — Other Ambulatory Visit (INDEPENDENT_AMBULATORY_CARE_PROVIDER_SITE_OTHER): Payer: Medicare Other

## 2014-03-30 DIAGNOSIS — I10 Essential (primary) hypertension: Secondary | ICD-10-CM

## 2014-03-30 LAB — BASIC METABOLIC PANEL
BUN: 19 mg/dL (ref 6–23)
CO2: 30 meq/L (ref 19–32)
Calcium: 9.3 mg/dL (ref 8.4–10.5)
Chloride: 106 mEq/L (ref 96–112)
Creatinine, Ser: 1 mg/dL (ref 0.4–1.5)
GFR: 80.31 mL/min (ref >60.00)
GLUCOSE: 89 mg/dL (ref 70–99)
POTASSIUM: 4 meq/L (ref 3.5–5.1)
SODIUM: 142 meq/L (ref 135–145)

## 2014-03-31 DIAGNOSIS — Z23 Encounter for immunization: Secondary | ICD-10-CM | POA: Diagnosis not present

## 2014-04-05 DIAGNOSIS — I10 Essential (primary) hypertension: Secondary | ICD-10-CM | POA: Diagnosis not present

## 2014-04-05 DIAGNOSIS — E785 Hyperlipidemia, unspecified: Secondary | ICD-10-CM | POA: Diagnosis not present

## 2014-05-02 DIAGNOSIS — Z23 Encounter for immunization: Secondary | ICD-10-CM | POA: Diagnosis not present

## 2014-06-01 ENCOUNTER — Other Ambulatory Visit: Payer: Medicare Other

## 2014-07-02 DIAGNOSIS — L570 Actinic keratosis: Secondary | ICD-10-CM | POA: Diagnosis not present

## 2014-07-02 DIAGNOSIS — L821 Other seborrheic keratosis: Secondary | ICD-10-CM | POA: Diagnosis not present

## 2014-07-02 DIAGNOSIS — D225 Melanocytic nevi of trunk: Secondary | ICD-10-CM | POA: Diagnosis not present

## 2014-07-02 DIAGNOSIS — L814 Other melanin hyperpigmentation: Secondary | ICD-10-CM | POA: Diagnosis not present

## 2014-12-26 ENCOUNTER — Encounter: Payer: Self-pay | Admitting: Cardiology

## 2015-01-14 ENCOUNTER — Other Ambulatory Visit: Payer: Self-pay

## 2015-02-11 ENCOUNTER — Other Ambulatory Visit: Payer: Self-pay

## 2015-02-11 MED ORDER — EZETIMIBE 10 MG PO TABS: 10.0000 mg | ORAL_TABLET | Freq: Every day | ORAL | Status: DC

## 2015-02-11 MED ORDER — ROSUVASTATIN CALCIUM 40 MG PO TABS: 40.0000 mg | ORAL_TABLET | Freq: Every day | ORAL | Status: DC

## 2015-02-11 MED ORDER — EZETIMIBE 10 MG PO TABS
10.0000 mg | ORAL_TABLET | Freq: Every day | ORAL | Status: DC
Start: 2015-02-11 — End: 2015-02-11

## 2015-02-11 MED ORDER — EZETIMIBE 10 MG PO TABS
10.0000 mg | ORAL_TABLET | Freq: Every day | ORAL | Status: DC
Start: 2015-02-11 — End: 2015-03-06

## 2015-03-06 ENCOUNTER — Telehealth: Payer: Self-pay | Admitting: Cardiology

## 2015-03-06 MED ORDER — ROSUVASTATIN CALCIUM 40 MG PO TABS: 40.0000 mg | ORAL_TABLET | Freq: Every day | ORAL | Status: DC

## 2015-03-06 MED ORDER — EZETIMIBE 10 MG PO TABS: 10.0000 mg | ORAL_TABLET | Freq: Every day | ORAL | Status: DC

## 2015-03-06 NOTE — Telephone Encounter (Signed)
Spoke with rep from [facility] and she states that prescription has to be physically signed. Richardson Dopp, PA-C signed prescription. Faxed to pharmacy.

## 2015-03-06 NOTE — Telephone Encounter (Signed)
New Message  Rep for [facility] calling to follow up on Prescription for crestor and zedia, that was sent and needs to be signed by Dr [name]. Please call back and discuss.

## 2015-03-25 NOTE — Progress Notes (Signed)
Cardiology Office Note   Date:  03/26/2015   ID:  [name], DOB [birth year], MRN [id number]  PCP:  Irven Shelling, MD    Chief Complaint  Patient presents with  . CAD      History of Present Illness: John Hinton is a 69 y.o. male with a history of ASCAD s/p CABG, HTN and dyslipidemia who presents today for followup. He is doing well. He says that for about 1 month he has been having chest discomfort in his chest that is very similar to prior angina.  This occurs about 1/4 of a mile into his walk but resolves with rest.  This radiates across his chest and into his neck.  He also gets SOB with the discomfort.  He has noticed increased fatigue as well.  He denies any LE edema, dizziness, palpitations or syncope.He has had a lot of anxiety related to elevators, tunnels or bridges which has limited his travel and then gets palpitations with it.   He checks his BP at home and runs around 140-150/64mHg. He walks outside for exercise but [place].    Past Medical History  Diagnosis Date  . Hypertension   . Dyslipidemia   . ED (erectile dysfunction)   . Shoulder, capsulitis, adhesive     Left Shoulder  . BPH (benign prostatic hypertrophy)   . Arrhythmia     Post-op  . Fear of heights   . Coronary artery disease     s/p CABG with LIMA to LAD, SVG seq to OM1 and OM2, SVG to PDA and SVG to diag  . RBBB     Past Surgical History  Procedure Laterality Date  . Coronary artery bypass graft      w/LIMA to LAD, seq SVG to OM1 and OM2, SVG to PDA and SVG to Diagonal     Current Outpatient Prescriptions  Medication Sig Dispense Refill  . ALPRAZolam (XANAX) 0.5 MG tablet Take 0.5 mg by mouth 2 (two) times daily as needed for anxiety.    .Marland Kitchenaspirin 81 MG tablet Take 81 mg by mouth daily.    . cholecalciferol (VITAMIN D) 1000 UNITS tablet Take 1,000 Units by mouth daily.    .Marland Kitchenezetimibe (ZETIA) 10 MG tablet Take 1 tablet (10 mg  total) by mouth daily. 90 tablet 3  . folic acid (FOLVITE) 4741MCG tablet Take 400 mcg by mouth daily.    . metoprolol tartrate (LOPRESSOR) 25 MG tablet Take 25 mg by mouth 2 (two) times daily.    . Multiple Vitamin (MULTIVITAMIN) tablet Take 1 tablet by mouth daily.    . nitroGLYCERIN (NITROSTAT) 0.4 MG SL tablet Place 0.4 mg under the tongue every 5 (five) minutes as needed for chest pain.    . ramipril (ALTACE) 10 MG capsule Take 2 capsules (20 mg total) by mouth daily. 180 capsule 3  . rosuvastatin (CRESTOR) 40 MG tablet Take 1 tablet (40 mg total) by mouth daily. 90 tablet 3   No current facility-administered medications for this visit.    Allergies:   Review of patient's allergies indicates no known allergies.    Social History:  The patient  reports that he quit smoking about 31 years ago. His smoking use included Cigarettes. He does not have any smokeless tobacco history on file. He reports that he drinks about 1.2 oz of alcohol per week. He reports  that he does not use illicit drugs.   Family History:  The patient's family history includes Cancer in his sister; Heart disease in his father; Heart failure in his father; Lung cancer in his father and mother.    ROS:  Please see the history of present illness.   Otherwise, review of systems are positive for none.   All other systems are reviewed and negative.    PHYSICAL EXAM: VS:  BP 150/96 mmHg  Pulse 52  Ht '5\' 11"'$  (1.803 m)  Wt 206 lb 9.6 oz (93.713 kg)  BMI 28.83 kg/m2 , BMI Body mass index is 28.83 kg/(m^2). GEN: Well nourished, well developed, in no acute distress HEENT: normal Neck: no JVD, carotid bruits, or masses Cardiac: RRR; no murmurs, rubs, or gallops,no edema  Respiratory:  clear to auscultation bilaterally, normal work of breathing GI: soft, nontender, nondistended, + BS MS: no deformity or atrophy Skin: warm and dry, no rash Neuro:  Strength and sensation are intact Psych: euthymic mood, full  affect   EKG:  EKG is ordered today. The ekg ordered today demonstrates Sinus bradycardia with RBBB   Recent Labs: 03/30/2014: BUN 19; Creatinine, Ser 1.0; Potassium 4.0; Sodium 142    Lipid Panel    Component Value Date/Time   CHOL 125 05/31/2013 1008   TRIG 69.0 05/31/2013 1008   HDL 49.60 05/31/2013 1008   CHOLHDL 3 05/31/2013 1008   VLDL 13.8 05/31/2013 1008   LDLCALC 62 05/31/2013 1008      Wt Readings from Last 3 Encounters:  03/26/15 206 lb 9.6 oz (93.713 kg)  03/23/14 203 lb (92.08 kg)  08/29/13 205 lb (92.987 kg)     ASSESSMENT AND PLAN:  1. ASCAD s/p CABG with exertional angina.  I am concerned that he could have graft failure since he is 10 years out from CABG.  I will get a Lexiscan myoview to rule out ischemia. - continue ASA   2. HTN - borderline controlled  - continue metoprolol/Altace - add Cardura '2mg'$  qhs - he had LE edema with amlodipine - check BMET  3. Dyslipidemia - at goal with LDL 62 - continue Crestor/zetia  - recheck FLP and ALT 4. Chronic RBBB 5. Asymptomatic bradycardia   Current medicines are reviewed at length with the patient today.  The patient does not have concerns regarding medicines.  The following changes have been made:  Start Cardura '2mg'$  daily  Labs/ tests ordered today: See above Assessment and Plan No orders of the defined types were placed in this encounter.     Disposition:   FU with me in 1 year  Signed, Sueanne Margarita, MD  03/26/2015 8:46 AM    [facility] [place], [place], [place]  [address] Phone: [phone]; Fax: [phone]

## 2015-03-26 ENCOUNTER — Encounter: Payer: Self-pay | Admitting: Cardiology

## 2015-03-26 ENCOUNTER — Ambulatory Visit (INDEPENDENT_AMBULATORY_CARE_PROVIDER_SITE_OTHER): Payer: Medicare Other | Admitting: Cardiology

## 2015-03-26 ENCOUNTER — Telehealth (HOSPITAL_COMMUNITY): Payer: Self-pay

## 2015-03-26 VITALS — BP 150/96 | HR 52 | Ht 71.0 in | Wt 206.6 lb

## 2015-03-26 DIAGNOSIS — E785 Hyperlipidemia, unspecified: Secondary | ICD-10-CM | POA: Diagnosis not present

## 2015-03-26 DIAGNOSIS — I10 Essential (primary) hypertension: Secondary | ICD-10-CM

## 2015-03-26 DIAGNOSIS — I2510 Atherosclerotic heart disease of native coronary artery without angina pectoris: Secondary | ICD-10-CM | POA: Diagnosis not present

## 2015-03-26 DIAGNOSIS — R001 Bradycardia, unspecified: Secondary | ICD-10-CM

## 2015-03-26 DIAGNOSIS — I4510 Unspecified right bundle-branch block: Secondary | ICD-10-CM | POA: Diagnosis not present

## 2015-03-26 DIAGNOSIS — R079 Chest pain, unspecified: Secondary | ICD-10-CM

## 2015-03-26 DIAGNOSIS — I25110 Atherosclerotic heart disease of native coronary artery with unstable angina pectoris: Secondary | ICD-10-CM | POA: Diagnosis not present

## 2015-03-26 DIAGNOSIS — Z23 Encounter for immunization: Secondary | ICD-10-CM | POA: Diagnosis not present

## 2015-03-26 MED ORDER — DOXAZOSIN MESYLATE 2 MG PO TABS: 2.0000 mg | ORAL_TABLET | Freq: Every day | ORAL | Status: DC

## 2015-03-26 NOTE — Patient Instructions (Addendum)
Medication Instructions:  Your physician has recommended you make the following change in your medication: 1) START CARDURA 2 mg at night   Labwork: Your physician recommends that you return for lab work THE SAME DAY AS YOUR STRESS TEST: BMET, LFTs, Lipids  Testing/Procedures: Your physician has requested that you have a lexiscan myoview. For further information please visit [email or web address]. Please follow instruction sheet, as given.  Follow-Up: Your physician wants you to follow-up in: 1 year with Dr. [name]. You will receive a reminder letter in the mail two months in advance. If you don't receive a letter, please call our office to schedule the follow-up appointment.   Any Other Special Instructions Will Be Listed Below (If Applicable).

## 2015-03-26 NOTE — Telephone Encounter (Signed)
Patient given detailed instructions per Myocardial Perfusion Study Information Sheet for test on 03-28-2015 at 0715. Patient notified to arrive 15 minutes early and that it is imperative to arrive on time for appointment to keep from having the test rescheduled.  If you need to cancel or reschedule your appointment, please call the office within 24 hours of your appointment. Failure to do so may result in a cancellation of your appointment, and a $50 no show fee. Patient verbalized understanding. Oletta Lamas, Raif Chachere A

## 2015-03-28 ENCOUNTER — Other Ambulatory Visit (INDEPENDENT_AMBULATORY_CARE_PROVIDER_SITE_OTHER): Payer: Medicare Other | Admitting: *Deleted

## 2015-03-28 ENCOUNTER — Encounter: Payer: Self-pay | Admitting: Cardiology

## 2015-03-28 ENCOUNTER — Ambulatory Visit (HOSPITAL_COMMUNITY): Payer: Medicare Other | Attending: Cardiovascular Disease

## 2015-03-28 DIAGNOSIS — R002 Palpitations: Secondary | ICD-10-CM | POA: Diagnosis not present

## 2015-03-28 DIAGNOSIS — I25110 Atherosclerotic heart disease of native coronary artery with unstable angina pectoris: Secondary | ICD-10-CM

## 2015-03-28 DIAGNOSIS — R9439 Abnormal result of other cardiovascular function study: Secondary | ICD-10-CM | POA: Insufficient documentation

## 2015-03-28 DIAGNOSIS — I4510 Unspecified right bundle-branch block: Secondary | ICD-10-CM | POA: Insufficient documentation

## 2015-03-28 DIAGNOSIS — R0609 Other forms of dyspnea: Secondary | ICD-10-CM | POA: Diagnosis not present

## 2015-03-28 DIAGNOSIS — R5383 Other fatigue: Secondary | ICD-10-CM | POA: Diagnosis not present

## 2015-03-28 DIAGNOSIS — R079 Chest pain, unspecified: Secondary | ICD-10-CM | POA: Diagnosis not present

## 2015-03-28 DIAGNOSIS — I10 Essential (primary) hypertension: Secondary | ICD-10-CM

## 2015-03-28 LAB — BASIC METABOLIC PANEL
BUN: 24 mg/dL — ABNORMAL HIGH (ref 6–23)
CHLORIDE: 107 meq/L (ref 96–112)
CO2: 29 meq/L (ref 19–32)
CREATININE: 1.38 mg/dL (ref 0.40–1.50)
Calcium: 9.8 mg/dL (ref 8.4–10.5)
GFR: 54.58 mL/min — ABNORMAL LOW (ref >60.00)
Glucose, Bld: 100 mg/dL — ABNORMAL HIGH (ref 70–99)
POTASSIUM: 3.6 meq/L (ref 3.5–5.1)
Sodium: 142 mEq/L (ref 135–145)

## 2015-03-28 LAB — HEPATIC FUNCTION PANEL
ALK PHOS: 54 U/L (ref 39–117)
ALT: 19 U/L (ref 0–53)
AST: 20 U/L (ref 0–37)
Albumin: 4.8 g/dL (ref 3.5–5.2)
BILIRUBIN DIRECT: 0.1 mg/dL (ref 0.0–0.3)
BILIRUBIN TOTAL: 0.6 mg/dL (ref 0.2–1.2)
TOTAL PROTEIN: 7.7 g/dL (ref 6.0–8.3)

## 2015-03-28 LAB — MYOCARDIAL PERFUSION IMAGING
CHL CUP NUCLEAR SDS: 3
CHL CUP RESTING HR STRESS: 50 {beats}/min
CSEPPHR: 79 {beats}/min
LHR: 0.31
LV dias vol: 186 mL
LVSYSVOL: 100 mL
SRS: 7
SSS: 10
TID: 1

## 2015-03-28 LAB — LIPID PANEL
CHOL/HDL RATIO: 3
Cholesterol: 110 mg/dL (ref 0–200)
HDL: 38.6 mg/dL — ABNORMAL LOW (ref >39.00)
LDL CALC: 58 mg/dL (ref 0–99)
NONHDL: 71.68
TRIGLYCERIDES: 66 mg/dL (ref 0.0–149.0)
VLDL: 13.2 mg/dL (ref 0.0–40.0)

## 2015-03-28 IMAGING — NM NM MISC PROCEDURE
9 series · 54 of 54 positions shown · non-contrast
Comparison: none

[Series 1: stress-sum-em · 6.40mm/px · 6 of 64 frames shown]
[frame 6/64]
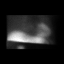
[frame 16/64]
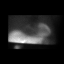
[frame 27/64]
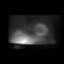
[frame 38/64]
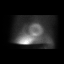
[frame 48/64]
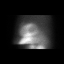
[frame 59/64]
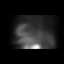

[Series 1: stress-gsp_(id)_sa · 6.4mm · 6.40mm/px · 6 of 512 frames shown]
[frame 43/512]
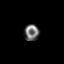
[frame 128/512]
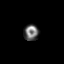
[frame 214/512]
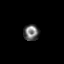
[frame 299/512]
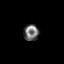
[frame 384/512]
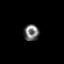
[frame 470/512]
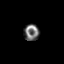

[Series 1: wbr_r-card_st rest · 6.4mm · 6.40mm/px · 6 of 24 frames shown]
[frame 3/24]
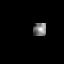
[frame 7/24]
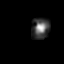
[frame 11/24]
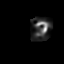
[frame 15/24]
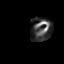
[frame 19/24]
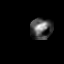
[frame 23/24]
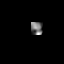

[Series 1: rest_(id)_sa · 6.4mm · 6.40mm/px · 6 of 64 frames shown]
[frame 6/64]
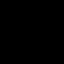
[frame 16/64]
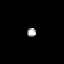
[frame 27/64]
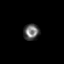
[frame 38/64]
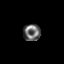
[frame 48/64]
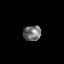
[frame 59/64]
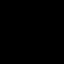

[Series 1: stress-gsp · 6.40mm/px · 6 of 511 frames shown]
[frame 43/511]
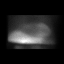
[frame 128/511]
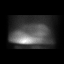
[frame 213/511]
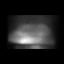
[frame 298/511]
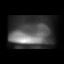
[frame 383/511]
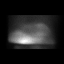
[frame 469/511]
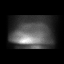

[Series 1: stress-sum-em_(id)_sa · 6.4mm · 6.40mm/px · 6 of 64 frames shown]
[frame 6/64]
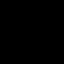
[frame 16/64]
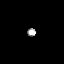
[frame 27/64]
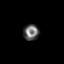
[frame 38/64]
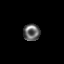
[frame 48/64]
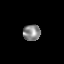
[frame 59/64]
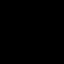

[Series 1: wbr_s-card_st stress-sum-em · 6.4mm · 6.40mm/px · 6 of 22 frames shown]
[frame 2/22]
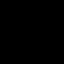
[frame 6/22]
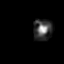
[frame 10/22]
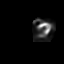
[frame 13/22]
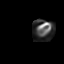
[frame 17/22]
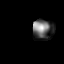
[frame 21/22]
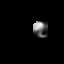

[Series 1: wbr_s-card_st stress-gsp · 6.4mm · 6.40mm/px · 6 of 177 frames shown]
[frame 15/177]
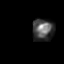
[frame 45/177]
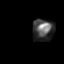
[frame 74/177]
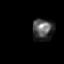
[frame 104/177]
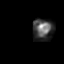
[frame 133/177]
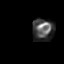
[frame 163/177]
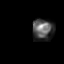

[Series 1: rest · 6.40mm/px · 6 of 64 frames shown]
[frame 6/64]
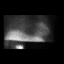
[frame 16/64]
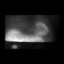
[frame 27/64]
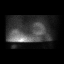
[frame 38/64]
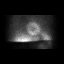
[frame 48/64]
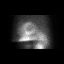
[frame 59/64]
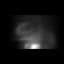

[54 of 54 positions shown; findings below may reference images not displayed]

Canned report from images found in remote index.

Refer to host system for actual result text.

## 2015-03-28 MED ORDER — TECHNETIUM TC 99M SESTAMIBI GENERIC - CARDIOLITE
32.6000 | Freq: Once | INTRAVENOUS | Status: AC | PRN
  Administered 2015-03-28: 33 via INTRAVENOUS

## 2015-03-28 MED ORDER — TECHNETIUM TC 99M SESTAMIBI GENERIC - CARDIOLITE
10.5000 | Freq: Once | INTRAVENOUS | Status: AC | PRN
  Administered 2015-03-28: 11 via INTRAVENOUS

## 2015-03-28 MED ORDER — REGADENOSON 0.4 MG/5ML IV SOLN
0.4000 mg | Freq: Once | INTRAVENOUS | Status: AC
  Administered 2015-03-28: 0.4 mg via INTRAVENOUS

## 2015-03-28 NOTE — Telephone Encounter (Signed)
Patient st the only day he took his doxazosin, he took it in the morning.  He went to play a round of golf later in the afternoon and got so dizzy he could not continue. He thought he was going to pass out. When he got home, he checked his BP. It was 107/68 - much lower than usual.  He did not take his doxazosin today. This AM, his BP was 148/85. To Dr. [name] for medication recommendations.

## 2015-03-29 ENCOUNTER — Other Ambulatory Visit: Payer: Self-pay | Admitting: Cardiology

## 2015-03-29 ENCOUNTER — Telehealth: Payer: Self-pay

## 2015-03-29 DIAGNOSIS — I209 Angina pectoris, unspecified: Secondary | ICD-10-CM

## 2015-03-29 NOTE — Telephone Encounter (Signed)
Informed patient of results and verbal understanding expressed.   Scheduled patient 04/05/15 for L heart cath with Dr. Burt Knack.  Reviewed instructions with patient and he has no further questions.

## 2015-03-29 NOTE — Telephone Encounter (Signed)
-----   Message from Sueanne Margarita, MD sent at 03/29/2015  2:05 PM EDT ----- Please let patient know that stress test is abnormal and I would like him set up for a left heart cath and possible PCI next week

## 2015-04-05 ENCOUNTER — Ambulatory Visit (HOSPITAL_COMMUNITY)
Admission: RE | Admit: 2015-04-05 | Discharge: 2015-04-06 | Disposition: A | Discharge status: Home / Self Care | Payer: Medicare Other | Source: Ambulatory Visit | Attending: Cardiovascular Disease | Admitting: Cardiovascular Disease

## 2015-04-05 ENCOUNTER — Encounter (HOSPITAL_COMMUNITY)
Admission: RE | Disposition: A | Discharge status: Home / Self Care | Payer: Self-pay | Source: Ambulatory Visit | Attending: Cardiovascular Disease

## 2015-04-05 ENCOUNTER — Encounter (HOSPITAL_COMMUNITY): Payer: Self-pay | Admitting: *Deleted

## 2015-04-05 ENCOUNTER — Other Ambulatory Visit: Payer: Self-pay

## 2015-04-05 DIAGNOSIS — I25119 Atherosclerotic heart disease of native coronary artery with unspecified angina pectoris: Secondary | ICD-10-CM | POA: Insufficient documentation

## 2015-04-05 DIAGNOSIS — I4510 Unspecified right bundle-branch block: Secondary | ICD-10-CM | POA: Diagnosis not present

## 2015-04-05 DIAGNOSIS — I25718 Atherosclerosis of autologous vein coronary artery bypass graft(s) with other forms of angina pectoris: Secondary | ICD-10-CM

## 2015-04-05 DIAGNOSIS — E785 Hyperlipidemia, unspecified: Secondary | ICD-10-CM | POA: Insufficient documentation

## 2015-04-05 DIAGNOSIS — Z955 Presence of coronary angioplasty implant and graft: Secondary | ICD-10-CM | POA: Diagnosis not present

## 2015-04-05 DIAGNOSIS — I10 Essential (primary) hypertension: Secondary | ICD-10-CM | POA: Diagnosis not present

## 2015-04-05 DIAGNOSIS — I209 Angina pectoris, unspecified: Secondary | ICD-10-CM | POA: Diagnosis present

## 2015-04-05 DIAGNOSIS — Z87891 Personal history of nicotine dependence: Secondary | ICD-10-CM | POA: Insufficient documentation

## 2015-04-05 DIAGNOSIS — N400 Enlarged prostate without lower urinary tract symptoms: Secondary | ICD-10-CM | POA: Insufficient documentation

## 2015-04-05 DIAGNOSIS — F419 Anxiety disorder, unspecified: Secondary | ICD-10-CM | POA: Diagnosis not present

## 2015-04-05 DIAGNOSIS — I25719 Atherosclerosis of autologous vein coronary artery bypass graft(s) with unspecified angina pectoris: Secondary | ICD-10-CM | POA: Insufficient documentation

## 2015-04-05 DIAGNOSIS — I2582 Chronic total occlusion of coronary artery: Secondary | ICD-10-CM | POA: Insufficient documentation

## 2015-04-05 DIAGNOSIS — Z7982 Long term (current) use of aspirin: Secondary | ICD-10-CM | POA: Diagnosis not present

## 2015-04-05 HISTORY — PX: CARDIAC CATHETERIZATION: SHX172

## 2015-04-05 LAB — CBC
HEMATOCRIT: 41.9 % (ref 39.0–52.0)
Hemoglobin: 14 g/dL (ref 13.0–17.0)
MCH: 32.7 pg (ref 26.0–34.0)
MCHC: 33.4 g/dL (ref 30.0–36.0)
MCV: 97.9 fL (ref 78.0–100.0)
Platelets: 147 10*3/uL — ABNORMAL LOW (ref 150–400)
RBC: 4.28 MIL/uL (ref 4.22–5.81)
RDW: 12.5 % (ref 11.5–15.5)
WBC: 4.7 10*3/uL (ref 4.0–10.5)

## 2015-04-05 LAB — POCT ACTIVATED CLOTTING TIME
Activated Clotting Time: 233 seconds
Activated Clotting Time: 276 seconds

## 2015-04-05 LAB — PROTIME-INR
INR: 1.07 (ref 0.00–1.49)
Prothrombin Time: 14.1 seconds (ref 11.6–15.2)

## 2015-04-05 SURGERY — LEFT HEART CATH AND CORONARY ANGIOGRAPHY
Anesthesia: LOCAL

## 2015-04-05 MED ORDER — DOXAZOSIN MESYLATE 2 MG PO TABS
2.0000 mg | ORAL_TABLET | Freq: Every day | ORAL | Status: DC
  Administered 2015-04-05: 21:00:00 2 mg via ORAL
  Filled 2015-04-05 (×2): qty 1

## 2015-04-05 MED ORDER — ASPIRIN 81 MG PO CHEW
81.0000 mg | CHEWABLE_TABLET | Freq: Every day | ORAL | Status: DC
  Filled 2015-04-05 (×2): qty 1

## 2015-04-05 MED ORDER — SODIUM CHLORIDE 0.9 % IJ SOLN: 3.0000 mL | Freq: Two times a day (BID) | INTRAMUSCULAR | Status: DC

## 2015-04-05 MED ORDER — SODIUM CHLORIDE 0.9 % IV SOLN
INTRAVENOUS | Status: AC
  Administered 2015-04-05: 14:00:00 via INTRAVENOUS

## 2015-04-05 MED ORDER — SODIUM CHLORIDE 0.9 % WEIGHT BASED INFUSION
3.0000 mL/kg/h | INTRAVENOUS | Status: DC
  Administered 2015-04-05: 3 mL/kg/h via INTRAVENOUS

## 2015-04-05 MED ORDER — MIDAZOLAM HCL 2 MG/2ML IJ SOLN
INTRAMUSCULAR | Status: AC
  Filled 2015-04-05: qty 4

## 2015-04-05 MED ORDER — SODIUM CHLORIDE 0.9 % IV SOLN: 250.0000 mL | INTRAVENOUS | Status: DC | PRN

## 2015-04-05 MED ORDER — ROSUVASTATIN CALCIUM 20 MG PO TABS
40.0000 mg | ORAL_TABLET | Freq: Every day | ORAL | Status: DC
Start: 2015-04-05 — End: 2015-04-06
  Administered 2015-04-05: 21:00:00 40 mg via ORAL
  Filled 2015-04-05 (×2): qty 2

## 2015-04-05 MED ORDER — EZETIMIBE 10 MG PO TABS
10.0000 mg | ORAL_TABLET | Freq: Every day | ORAL | Status: DC
  Administered 2015-04-05: 10 mg via ORAL
  Filled 2015-04-05 (×2): qty 1

## 2015-04-05 MED ORDER — HEPARIN SODIUM (PORCINE) 1000 UNIT/ML IJ SOLN
INTRAMUSCULAR | Status: DC | PRN
  Administered 2015-04-05: 5000 [IU] via INTRAVENOUS
  Administered 2015-04-05: 4000 [IU] via INTRAVENOUS

## 2015-04-05 MED ORDER — MORPHINE SULFATE (PF) 2 MG/ML IV SOLN: 2.0000 mg | INTRAVENOUS | Status: DC | PRN

## 2015-04-05 MED ORDER — HEPARIN (PORCINE) IN NACL 2-0.9 UNIT/ML-% IJ SOLN
INTRAMUSCULAR | Status: DC | PRN
  Administered 2015-04-05: 13:00:00

## 2015-04-05 MED ORDER — RAMIPRIL 10 MG PO CAPS
20.0000 mg | ORAL_CAPSULE | Freq: Every day | ORAL | Status: DC
  Administered 2015-04-06: 10:00:00 20 mg via ORAL
  Filled 2015-04-05 (×2): qty 2

## 2015-04-05 MED ORDER — SODIUM CHLORIDE 0.9 % WEIGHT BASED INFUSION: 1.0000 mL/kg/h | INTRAVENOUS | Status: DC

## 2015-04-05 MED ORDER — OXYCODONE-ACETAMINOPHEN 5-325 MG PO TABS: 1.0000 | ORAL_TABLET | ORAL | Status: DC | PRN

## 2015-04-05 MED ORDER — IOHEXOL 350 MG/ML SOLN
INTRAVENOUS | Status: DC | PRN
  Administered 2015-04-05: 105 mL via INTRA_ARTERIAL

## 2015-04-05 MED ORDER — MIDAZOLAM HCL 2 MG/2ML IJ SOLN
INTRAMUSCULAR | Status: DC | PRN
  Administered 2015-04-05 (×2): 2 mg via INTRAVENOUS

## 2015-04-05 MED ORDER — LIDOCAINE HCL (PF) 1 % IJ SOLN
INTRAMUSCULAR | Status: AC
  Filled 2015-04-05: qty 30

## 2015-04-05 MED ORDER — ASPIRIN 81 MG PO CHEW
81.0000 mg | CHEWABLE_TABLET | ORAL | Status: AC
  Administered 2015-04-05: 81 mg via ORAL

## 2015-04-05 MED ORDER — ONDANSETRON HCL 4 MG/2ML IJ SOLN: 4.0000 mg | Freq: Four times a day (QID) | INTRAMUSCULAR | Status: DC | PRN

## 2015-04-05 MED ORDER — FENTANYL CITRATE (PF) 100 MCG/2ML IJ SOLN
INTRAMUSCULAR | Status: DC | PRN
  Administered 2015-04-05 (×2): 25 ug via INTRAVENOUS

## 2015-04-05 MED ORDER — CLOPIDOGREL BISULFATE 75 MG PO TABS
ORAL_TABLET | ORAL | Status: DC | PRN
  Administered 2015-04-05: 600 mg via ORAL

## 2015-04-05 MED ORDER — ASPIRIN 81 MG PO CHEW
CHEWABLE_TABLET | ORAL | Status: AC
  Filled 2015-04-05: qty 1

## 2015-04-05 MED ORDER — HEPARIN (PORCINE) IN NACL 2-0.9 UNIT/ML-% IJ SOLN
INTRAMUSCULAR | Status: AC
  Filled 2015-04-05: qty 1000

## 2015-04-05 MED ORDER — ALPRAZOLAM 0.5 MG PO TABS: 0.5000 mg | ORAL_TABLET | Freq: Two times a day (BID) | ORAL | Status: DC | PRN

## 2015-04-05 MED ORDER — VERAPAMIL HCL 2.5 MG/ML IV SOLN
INTRAVENOUS | Status: AC
  Filled 2015-04-05: qty 2

## 2015-04-05 MED ORDER — SODIUM CHLORIDE 0.9 % IJ SOLN: 3.0000 mL | INTRAMUSCULAR | Status: DC | PRN

## 2015-04-05 MED ORDER — FENTANYL CITRATE (PF) 100 MCG/2ML IJ SOLN
INTRAMUSCULAR | Status: AC
  Filled 2015-04-05: qty 4

## 2015-04-05 MED ORDER — METOPROLOL TARTRATE 25 MG PO TABS
25.0000 mg | ORAL_TABLET | Freq: Two times a day (BID) | ORAL | Status: DC
  Administered 2015-04-05: 25 mg via ORAL
  Filled 2015-04-05 (×2): qty 1

## 2015-04-05 MED ORDER — CLOPIDOGREL BISULFATE 300 MG PO TABS
ORAL_TABLET | ORAL | Status: AC
  Filled 2015-04-05: qty 2

## 2015-04-05 MED ORDER — HEPARIN SODIUM (PORCINE) 1000 UNIT/ML IJ SOLN
INTRAMUSCULAR | Status: AC
  Filled 2015-04-05: qty 1

## 2015-04-05 MED ORDER — LIDOCAINE HCL (PF) 1 % IJ SOLN
INTRAMUSCULAR | Status: DC | PRN
  Administered 2015-04-05: 5 mL

## 2015-04-05 MED ORDER — SODIUM CHLORIDE 0.9 % IV SOLN
INTRAVENOUS | Status: DC | PRN
  Administered 2015-04-05: 92 mL/kg/h via INTRAVENOUS

## 2015-04-05 MED ORDER — VERAPAMIL HCL 2.5 MG/ML IV SOLN
INTRAVENOUS | Status: DC | PRN
  Administered 2015-04-05: 10 mL via INTRA_ARTERIAL

## 2015-04-05 MED ORDER — CLOPIDOGREL BISULFATE 75 MG PO TABS
75.0000 mg | ORAL_TABLET | Freq: Every day | ORAL | Status: DC
  Administered 2015-04-06: 09:00:00 75 mg via ORAL

## 2015-04-05 MED ORDER — NITROGLYCERIN 1 MG/10 ML FOR IR/CATH LAB
INTRA_ARTERIAL | Status: AC
  Filled 2015-04-05: qty 10

## 2015-04-05 MED ORDER — ANGIOPLASTY BOOK
Freq: Once | Status: AC
  Administered 2015-04-05: 21:00:00
  Filled 2015-04-05: qty 1

## 2015-04-05 MED ORDER — ACETAMINOPHEN 325 MG PO TABS: 650.0000 mg | ORAL_TABLET | ORAL | Status: DC | PRN

## 2015-04-05 SURGICAL SUPPLY — 18 items
BALLN EMERGE MR 2.5X15 (BALLOONS) ×2
BALLOON EMERGE MR 2.5X15 (BALLOONS) IMPLANT
CATH EXPO 5F MPA-1 (CATHETERS) ×1 IMPLANT
CATH INFINITI 5 FR AL2 (CATHETERS) ×1 IMPLANT
CATH INFINITI 5FR MULTPACK ANG (CATHETERS) ×1 IMPLANT
CATH VISTA GUIDE 6FR AL2 (CATHETERS) ×1 IMPLANT
DEVICE RAD COMP TR BAND LRG (VASCULAR PRODUCTS) ×2 IMPLANT
DEVICE SPIDERFX EMB PROT 4MM (WIRE) ×1 IMPLANT
GLIDESHEATH SLEND SS 6F .021 (SHEATH) ×2 IMPLANT
KIT ENCORE 26 ADVANTAGE (KITS) ×1 IMPLANT
KIT HEART LEFT (KITS) ×2 IMPLANT
PACK CARDIAC CATHETERIZATION (CUSTOM PROCEDURE TRAY) ×2 IMPLANT
STENT PROMUS PREM MR 3.5X16 (Permanent Stent) ×1 IMPLANT
SYR MEDRAD MARK V 150ML (SYRINGE) ×2 IMPLANT
TRANSDUCER W/STOPCOCK (MISCELLANEOUS) ×2 IMPLANT
TUBING CIL FLEX 10 FLL-RA (TUBING) ×2 IMPLANT
WIRE COUGAR XT STRL 190CM (WIRE) ×1 IMPLANT
WIRE SAFE-T 1.5MM-J .035X260CM (WIRE) ×2 IMPLANT

## 2015-04-05 NOTE — Progress Notes (Signed)
TR BAND REMOVAL  LOCATION:  left radial  DEFLATED PER PROTOCOL:  Yes.    TIME BAND OFF / DRESSING APPLIED:   1830    SITE UPON ARRIVAL:   Level 0  SITE AFTER BAND REMOVAL:  Level 0   CIRCULATION SENSATION AND MOVEMENT:  Within Normal Limits  Yes.    COMMENTS:  oozing

## 2015-04-05 NOTE — H&P (View-Only) (Signed)
Cardiology Office Note   Date:  03/26/2015   ID:  John Hinton, DOB Jun 24, 1946, MRN [id number]  PCP:  Irven Shelling, MD    Chief Complaint  Patient presents with  . CAD      History of Present Illness: John Hinton is a 69 y.o. male with a history of ASCAD s/p CABG, HTN and dyslipidemia who presents today for followup. He is doing well. He says that for about 1 month he has been having chest discomfort in his chest that is very similar to prior angina.  This occurs about 1/4 of a mile into his walk but resolves with rest.  This radiates across his chest and into his neck.  He also gets SOB with the discomfort.  He has noticed increased fatigue as well.  He denies any LE edema, dizziness, palpitations or syncope.He has had a lot of anxiety related to elevators, tunnels or bridges which has limited his travel and then gets palpitations with it.   He checks his BP at home and runs around 140-150/14mHg. He walks outside for exercise but [place]    Past Medical History  Diagnosis Date  . Hypertension   . Dyslipidemia   . ED (erectile dysfunction)   . Shoulder, capsulitis, adhesive     Left Shoulder  . BPH (benign prostatic hypertrophy)   . Arrhythmia     Post-op  . Fear of heights   . Coronary artery disease     s/p CABG with LIMA to LAD, SVG seq to OM1 and OM2, SVG to PDA and SVG to diag  . RBBB     Past Surgical History  Procedure Laterality Date  . Coronary artery bypass graft      w/LIMA to LAD, seq SVG to OM1 and OM2, SVG to PDA and SVG to Diagonal     Current Outpatient Prescriptions  Medication Sig Dispense Refill  . ALPRAZolam (XANAX) 0.5 MG tablet Take 0.5 mg by mouth 2 (two) times daily as needed for anxiety.    .Marland Kitchenaspirin 81 MG tablet Take 81 mg by mouth daily.    . cholecalciferol (VITAMIN D) 1000 UNITS tablet Take 1,000 Units by mouth daily.    .Marland Kitchenezetimibe (ZETIA) 10 MG tablet Take 1 tablet (10 mg  total) by mouth daily. 90 tablet 3  . folic acid (FOLVITE) 4709MCG tablet Take 400 mcg by mouth daily.    . metoprolol tartrate (LOPRESSOR) 25 MG tablet Take 25 mg by mouth 2 (two) times daily.    . Multiple Vitamin (MULTIVITAMIN) tablet Take 1 tablet by mouth daily.    . nitroGLYCERIN (NITROSTAT) 0.4 MG SL tablet Place 0.4 mg under the tongue every 5 (five) minutes as needed for chest pain.    . ramipril (ALTACE) 10 MG capsule Take 2 capsules (20 mg total) by mouth daily. 180 capsule 3  . rosuvastatin (CRESTOR) 40 MG tablet Take 1 tablet (40 mg total) by mouth daily. 90 tablet 3   No current facility-administered medications for this visit.    Allergies:   Review of patient's allergies indicates no known allergies.    Social History:  The patient  reports that he quit smoking about 31 years ago. His smoking use included Cigarettes. He does not have any smokeless tobacco history on file. He reports that he drinks about 1.2 oz of alcohol per week. He reports  that he does not use illicit drugs.   Family History:  The patient's family history includes Cancer in his sister; Heart disease in his father; Heart failure in his father; Lung cancer in his father and mother.    ROS:  Please see the history of present illness.   Otherwise, review of systems are positive for none.   All other systems are reviewed and negative.    PHYSICAL EXAM: VS:  BP 150/96 mmHg  Pulse 52  Ht '5\' 11"'$  (1.803 m)  Wt 206 lb 9.6 oz (93.713 kg)  BMI 28.83 kg/m2 , BMI Body mass index is 28.83 kg/(m^2). GEN: Well nourished, well developed, in no acute distress HEENT: normal Neck: no JVD, carotid bruits, or masses Cardiac: RRR; no murmurs, rubs, or gallops,no edema  Respiratory:  clear to auscultation bilaterally, normal work of breathing GI: soft, nontender, nondistended, + BS MS: no deformity or atrophy Skin: warm and dry, no rash Neuro:  Strength and sensation are intact Psych: euthymic mood, full  affect   EKG:  EKG is ordered today. The ekg ordered today demonstrates Sinus bradycardia with RBBB   Recent Labs: 03/30/2014: BUN 19; Creatinine, Ser 1.0; Potassium 4.0; Sodium 142    Lipid Panel    Component Value Date/Time   CHOL 125 05/31/2013 1008   TRIG 69.0 05/31/2013 1008   HDL 49.60 05/31/2013 1008   CHOLHDL 3 05/31/2013 1008   VLDL 13.8 05/31/2013 1008   LDLCALC 62 05/31/2013 1008      Wt Readings from Last 3 Encounters:  03/26/15 206 lb 9.6 oz (93.713 kg)  03/23/14 203 lb (92.08 kg)  08/29/13 205 lb (92.987 kg)     ASSESSMENT AND PLAN:  1. ASCAD s/p CABG with exertional angina.  I am concerned that he could have graft failure since he is 10 years out from CABG.  I will get a Lexiscan myoview to rule out ischemia. - continue ASA   2. HTN - borderline controlled  - continue metoprolol/Altace - add Cardura '2mg'$  qhs - he had LE edema with amlodipine - check BMET  3. Dyslipidemia - at goal with LDL 62 - continue Crestor/zetia  - recheck FLP and ALT 4. Chronic RBBB 5. Asymptomatic bradycardia   Current medicines are reviewed at length with the patient today.  The patient does not have concerns regarding medicines.  The following changes have been made:  Start Cardura '2mg'$  daily  Labs/ tests ordered today: See above Assessment and Plan No orders of the defined types were placed in this encounter.     Disposition:   FU with me in 1 year  Signed, Sueanne Margarita, MD  03/26/2015 8:46 AM    [facility] [address], [place], [place]  [address] Phone: [phone]; Fax: [phone]

## 2015-04-05 NOTE — Interval H&P Note (Signed)
Cath Lab Visit (complete for each Cath Lab visit)  Clinical Evaluation Leading to the Procedure:   ACS: No.  Non-ACS:    Anginal Classification: CCS II  Anti-ischemic medical therapy: Minimal Therapy (1 class of medications)  Non-Invasive Test Results: Intermediate-risk stress test findings: cardiac mortality 1-3%/year  Prior CABG: Previous CABG      History and Physical Interval Note:  04/05/2015 10:58 AM  John Hinton  has presented today for surgery, with the diagnosis of abnormal myoview  The various methods of treatment have been discussed with the patient and family. After consideration of risks, benefits and other options for treatment, the patient has consented to  Procedure(s): Left Heart Cath and Coronary Angiography (N/A) as a surgical intervention .  The patient's history has been reviewed, patient examined, no change in status, stable for surgery.  I have reviewed the patient's chart and labs.  Questions were answered to the patient's satisfaction.     Sherren Mocha

## 2015-04-06 ENCOUNTER — Encounter (HOSPITAL_COMMUNITY): Payer: Self-pay | Admitting: *Deleted

## 2015-04-06 ENCOUNTER — Other Ambulatory Visit: Payer: Self-pay | Admitting: Physician Assistant

## 2015-04-06 DIAGNOSIS — I25719 Atherosclerosis of autologous vein coronary artery bypass graft(s) with unspecified angina pectoris: Secondary | ICD-10-CM | POA: Diagnosis not present

## 2015-04-06 DIAGNOSIS — I25119 Atherosclerotic heart disease of native coronary artery with unspecified angina pectoris: Secondary | ICD-10-CM | POA: Diagnosis not present

## 2015-04-06 DIAGNOSIS — I209 Angina pectoris, unspecified: Secondary | ICD-10-CM | POA: Diagnosis not present

## 2015-04-06 DIAGNOSIS — Z955 Presence of coronary angioplasty implant and graft: Secondary | ICD-10-CM | POA: Diagnosis not present

## 2015-04-06 DIAGNOSIS — E785 Hyperlipidemia, unspecified: Secondary | ICD-10-CM | POA: Diagnosis not present

## 2015-04-06 DIAGNOSIS — I10 Essential (primary) hypertension: Secondary | ICD-10-CM | POA: Diagnosis not present

## 2015-04-06 DIAGNOSIS — I2582 Chronic total occlusion of coronary artery: Secondary | ICD-10-CM | POA: Diagnosis not present

## 2015-04-06 MED ORDER — AMLODIPINE BESYLATE 5 MG PO TABS: 5.0000 mg | ORAL_TABLET | Freq: Every day | ORAL | Status: DC

## 2015-04-06 MED ORDER — NITROGLYCERIN 0.4 MG SL SUBL: 0.4000 mg | SUBLINGUAL_TABLET | SUBLINGUAL | Status: DC | PRN

## 2015-04-06 MED ORDER — CLOPIDOGREL BISULFATE 75 MG PO TABS: 75.0000 mg | ORAL_TABLET | Freq: Every day | ORAL | Status: DC

## 2015-04-06 MED ORDER — SPIRONOLACTONE 25 MG PO TABS: 25.0000 mg | ORAL_TABLET | Freq: Every day | ORAL | Status: DC

## 2015-04-06 NOTE — Progress Notes (Signed)
CARDIOLOGY DISCHARGE SUMMARY   Patient ID: John Hinton MRN: [id number] DOB/AGE: 1946/04/27 69 y.o.  Admit date: 04/05/2015 Discharge date: 04/06/2015  PCP: Irven Shelling, MD Primary Cardiologist: Dr. [name]  Primary Discharge Diagnosis:  Exertional angina, abnormal stress test Secondary Discharge Diagnosis:    Ischemic chest pain   Sinus bradycardia  Procedures: Cardiac catheterization, coronary arteriogram, left ventriculogram, PTCA and drug-eluting stent, LIMA arteriogram, SVG angiogram  Hospital Course: John Hinton is a 69 y.o. male with a history of CABG, HTN and dyslipidemia. He was seen by Dr. [name] in the office and was having exertional chest pain. A Myoview was performed that was abnormal and he came to the hospital for catheterization on 04/05/2015.  Cardiac catheterization conclusions are below. He has severe native three-vessel disease. All grafts were patent but there was significant stenosis in the SVG-OM 1-OM 2. This was treated with a drug-eluting stent, reducing the stenosis to 0. He tolerated the procedure well.  On 04/06/2015, he was seen by cardiac rehabilitation and by Dr. Caryl Comes. He ambulated well with them and was educated on heart-healthy lifestyle modifications, stent restrictions and exercise guidelines. Dr. Caryl Comes reviewed the cath report and his medications. He was ambulating without chest pain or shortness of breath and no further inpatient workup is indicated. He is considered stable for discharge, to follow up as an outpatient.  BP 167/95 mmHg  Pulse 79  Temp(Src) 98 F (36.7 C) (Oral)  Resp 19  Ht '5\' 11"'$  (1.803 m)  Wt 209 lb 7 oz (95 kg)  BMI 29.22 kg/m2  SpO2 100% General: Well developed, well nourished, male in no acute distress Head: Eyes PERRLA, No xanthomas.   Normocephalic and atraumatic  Lungs: Clear bilaterally to auscultation. Heart: HRRR S1 S2, without MRG.  Pulses are 2+ & equal. No carotid bruit. No JVD. Abdomen:  Bowel sounds are present, abdomen soft and non-tender without masses or  hernias noted. Msk: Normal strength and tone for age. Extremities: No clubbing, cyanosis or edema. Left radial cath site with minimal ecchymosis, no hematoma   Skin:  No rashes or lesions noted. Neuro: Alert and oriented X 3. Psych:  Good affect, responds appropriately  Labs:   Lab Results  Component Value Date   WBC 4.7 04/05/2015   HGB 14.0 04/05/2015   HCT 41.9 04/05/2015   MCV 97.9 04/05/2015   PLT 147* 04/05/2015    Recent Labs  04/05/15 1000  INR 1.07   Cardiac Cath: 04/05/2015 1. Severe 3 vessel CAD with total occlusion of the RCA, LAD, and LCx, and severe left main stenosis 2. S/P CABG with patency of the SVG-diagonal (fills the LAD), SVG-PDA, and LIMA-LAD 3. Severe stenosis of the SVG-OM1 and OM2 treated successfully with PCI using distal embolic protection and a DES platform  Recommend DAPT with ASA and plavix at least 12 months  FOLLOW UP PLANS AND APPOINTMENTS Allergies  Allergen Reactions  . Amlodipine Swelling    Edema     Medication List    STOP taking these medications        metoprolol tartrate 25 MG tablet  Commonly known as:  LOPRESSOR      TAKE these medications        ALPRAZolam 0.5 MG tablet  Commonly known as:  XANAX  Take 0.5 mg by mouth 2 (two) times daily as needed for anxiety.     aspirin 81 MG tablet  Take 81 mg by mouth daily.     cholecalciferol 1000 UNITS tablet  Commonly known as:  VITAMIN D  Take 1,000 Units by mouth daily.     clopidogrel 75 MG tablet  Commonly known as:  PLAVIX  Take 1 tablet (75 mg total) by mouth daily with breakfast.     doxazosin 2 MG tablet  Commonly known as:  CARDURA  Take 1 tablet (2 mg total) by mouth daily.     ezetimibe 10 MG tablet  Commonly known as:  ZETIA  Take 1 tablet (10 mg total) by mouth daily.     folic acid 829 MCG tablet  Commonly known as:  FOLVITE  Take 400 mcg by mouth daily.     multivitamin  tablet  Take 1 tablet by mouth daily.     nitroGLYCERIN 0.4 MG SL tablet  Commonly known as:  NITROSTAT  Place 1 tablet (0.4 mg total) under the tongue every 5 (five) minutes as needed for chest pain.     ramipril 10 MG capsule  Commonly known as:  ALTACE  Take 2 capsules (20 mg total) by mouth daily.     rosuvastatin 40 MG tablet  Commonly known as:  CRESTOR  Take 1 tablet (40 mg total) by mouth daily.     spironolactone 25 MG tablet  Commonly known as:  ALDACTONE  Take 1 tablet (25 mg total) by mouth daily.        Discharge Instructions    Amb Referral to Cardiac Rehabilitation    Complete by:  As directed   Congestive Heart Failure: If diagnosis is Heart Failure, patient MUST meet each of the CMS criteria: 1. Left Ventricular Ejection Fraction </= 35% 2. NYHA class II-IV symptoms despite being on optimal heart failure therapy for at least 6 weeks. 3. Stable = have not had a recent (<6 weeks) or planned (<6 months) major cardiovascular hospitalization or procedure  Program Details: - Physician supervised classes - 1-3 classes per week over a 12-18 week period, generally for a total of 36 sessions  Physician Certification: I certify that the above Cardiac Rehabilitation treatment is medically necessary and is medically approved by me for treatment of this patient. The patient is willing and cooperative, able to ambulate and medically stable to participate in exercise rehabilitation. The participant's progress and Individualized Treatment Plan will be reviewed by the Medical Director, Cardiac Rehab staff and as indicated by the Referring/Ordering Physician.  Diagnosis:  PCI     Diet - low sodium heart healthy    Complete by:  As directed      Increase activity slowly    Complete by:  As directed           Follow-up Information    Follow up with Sueanne Margarita, MD.   Specialty:  Cardiology   Why:  The office will call   Contact information:   [address] [place] [address] [phone]       BRING ALL MEDICATIONS WITH YOU TO FOLLOW UP APPOINTMENTS  Time spent with patient to include physician time: 52 min Signed: Virl Axe, MD 04/06/2015, 10:07 AM Co-Sign MD Patient had asymptomatic bradycardia. Given recent revascularization angina, we could anticipate that the role of beta blockers in the context of chronic stable artery disease minimal as the revascularization itself should address his angina. Alternatively, nocturnal bradycardia isn't an issue anyway and his beta blockers could be continued. We will relay to Dr. [name] our decision to stop BB and begin aldactone, as he has not tolerated amlodipine  She can  then choose to pursue this course or reverse it

## 2015-04-06 NOTE — Progress Notes (Signed)
CARDIAC REHAB PHASE I   PRE:  Rate/Rhythm: 79 SR  BP:  Supine: 167/95  Sitting:   Standing:    SaO2:   MODE:  Ambulation: 475 ft   POST:  Rate/Rhythm: 102 ST  BP:  Supine:   Sitting: 191/104, 163/95   Standing:    SaO2:  4315-4008 Pt walked 475 ft with steady gait. Pt is afraid of heights and even seeing the windows bothered him. Kept him close to wall and away from windows AMAP. BP elevated after walk and pt stated because of this. Back to before walk after education when taken again. Walked 475 ft with steady gait. Education completed and pt voiced understanding. Stressed importance of plavix with stent. Referring to [place] Phase 2 but pt stated would probably not do as he had done program before and exercises at the gym and walks daily. Will consider.    Graylon Good, RN BSN  04/06/2015 8:48 AM

## 2015-04-06 NOTE — Discharge Instructions (Signed)

## 2015-04-08 ENCOUNTER — Telehealth: Payer: Self-pay | Admitting: Physician Assistant

## 2015-04-08 ENCOUNTER — Encounter (HOSPITAL_COMMUNITY): Payer: Self-pay | Admitting: Cardiovascular Disease

## 2015-04-08 NOTE — Telephone Encounter (Signed)
Patient contacted regarding discharge from [facility] on 9/17.  Patient understands to follow up with provider Melina Copa, PA on 10/3 at 8:30 am at [facility]. Patient understands discharge instructions? Yes Patient understands medications and regiment? Yes Patient understands to bring all medications to this visit? Yes  Patient denies complaints and is aware to call with questions or concerns prior to follow-up appointment

## 2015-04-08 NOTE — Telephone Encounter (Signed)
Left message for patient to call the office

## 2015-04-08 NOTE — Telephone Encounter (Signed)
New problem     TCM appt sched w/Danya Dunn 10.3.16 per after hr vm.

## 2015-04-09 DIAGNOSIS — I10 Essential (primary) hypertension: Secondary | ICD-10-CM | POA: Diagnosis not present

## 2015-04-09 DIAGNOSIS — E780 Pure hypercholesterolemia: Secondary | ICD-10-CM | POA: Diagnosis not present

## 2015-04-09 DIAGNOSIS — G250 Essential tremor: Secondary | ICD-10-CM | POA: Diagnosis not present

## 2015-04-09 DIAGNOSIS — Z0000 Encounter for general adult medical examination without abnormal findings: Secondary | ICD-10-CM | POA: Diagnosis not present

## 2015-04-09 DIAGNOSIS — Z125 Encounter for screening for malignant neoplasm of prostate: Secondary | ICD-10-CM | POA: Diagnosis not present

## 2015-04-09 DIAGNOSIS — I25119 Atherosclerotic heart disease of native coronary artery with unspecified angina pectoris: Secondary | ICD-10-CM | POA: Diagnosis not present

## 2015-04-09 DIAGNOSIS — F40241 Acrophobia: Secondary | ICD-10-CM | POA: Diagnosis not present

## 2015-04-09 DIAGNOSIS — R0683 Snoring: Secondary | ICD-10-CM | POA: Diagnosis not present

## 2015-04-09 DIAGNOSIS — Z1389 Encounter for screening for other disorder: Secondary | ICD-10-CM | POA: Diagnosis not present

## 2015-04-09 NOTE — Discharge Summary (Signed)
CARDIOLOGY DISCHARGE SUMMARY   Patient ID: John Hinton MRN: [id number] DOB/AGE: [birth year] 69 y.o.  Admit date: 04/05/2015 Discharge date: 04/06/2015  PCP: Irven Shelling, MD Primary Cardiologist: Dr. [name]  Primary Discharge Diagnosis: Exertional angina, abnormal stress test Secondary Discharge Diagnosis:   Ischemic chest pain  Sinus bradycardia  Procedures: Cardiac catheterization, coronary arteriogram, left ventriculogram, PTCA and drug-eluting stent, LIMA arteriogram, SVG angiogram  Hospital Course: John Hinton is a 69 y.o. male with a history of CABG, HTN and dyslipidemia. He was seen by Dr. [name] in the office and was having exertional chest pain. A Myoview was performed that was abnormal and he came to the hospital for catheterization on 04/05/2015.  Cardiac catheterization conclusions are below. He has severe native three-vessel disease. All grafts were patent but there was significant stenosis in the SVG-OM 1-OM 2. This was treated with a drug-eluting stent, reducing the stenosis to 0. He tolerated the procedure well.  On 04/06/2015, he was seen by cardiac rehabilitation and by Dr. Caryl Comes. He ambulated well with them and was educated on heart-healthy lifestyle modifications, stent restrictions and exercise guidelines. Dr. Caryl Comes reviewed the cath report and his medications. He was ambulating without chest pain or shortness of breath and no further inpatient workup is indicated. He is considered stable for discharge, to follow up as an outpatient.  BP 167/95 mmHg  Pulse 79  Temp(Src) 98 F (36.7 C) (Oral)  Resp 19  Ht '5\' 11"'$  (1.803 m)  Wt 209 lb 7 oz (95 kg)  BMI 29.22 kg/m2  SpO2 100% General: Well developed, well nourished, male in no acute distress Head: Eyes PERRLA, No xanthomas. Normocephalic and atraumatic Lungs: Clear bilaterally to auscultation. Heart: HRRR S1 S2, without MRG. Pulses are 2+ & equal. No carotid bruit. No  JVD. Abdomen: Bowel sounds are present, abdomen soft and non-tender without masses or hernias noted. Msk: Normal strength and tone for age. Extremities: No clubbing, cyanosis or edema. Left radial cath site with minimal ecchymosis, no hematoma  Skin: No rashes or lesions noted. Neuro: Alert and oriented X 3. Psych: Good affect, responds appropriately  Labs:   Recent Labs    Lab Results  Component Value Date   WBC 4.7 04/05/2015   HGB 14.0 04/05/2015   HCT 41.9 04/05/2015   MCV 97.9 04/05/2015   PLT 147* 04/05/2015      Recent Labs (last 2 labs)      Recent Labs  04/05/15 1000  INR 1.07     Cardiac Cath: 04/05/2015 1. Severe 3 vessel CAD with total occlusion of the RCA, LAD, and LCx, and severe left main stenosis 2. S/P CABG with patency of the SVG-diagonal (fills the LAD), SVG-PDA, and LIMA-LAD 3. Severe stenosis of the SVG-OM1 and OM2 treated successfully with PCI using distal embolic protection and a DES platform  Recommend DAPT with ASA and plavix at least 12 months  FOLLOW UP PLANS AND APPOINTMENTS Allergies  Allergen Reactions  . Amlodipine Swelling    Edema     Medication List    STOP taking these medications       metoprolol tartrate 25 MG tablet  Commonly known as: LOPRESSOR      TAKE these medications       ALPRAZolam 0.5 MG tablet  Commonly known as: XANAX  Take 0.5 mg by mouth 2 (two) times daily as needed for anxiety.     aspirin 81 MG tablet  Take 81 mg by mouth daily.  cholecalciferol 1000 UNITS tablet  Commonly known as: VITAMIN D  Take 1,000 Units by mouth daily.     clopidogrel 75 MG tablet  Commonly known as: PLAVIX  Take 1 tablet (75 mg total) by mouth daily with breakfast.     doxazosin 2 MG tablet  Commonly known as: CARDURA  Take 1 tablet (2 mg total) by mouth daily.     ezetimibe 10 MG tablet  Commonly known as: ZETIA  Take 1  tablet (10 mg total) by mouth daily.     folic acid 161 MCG tablet  Commonly known as: FOLVITE  Take 400 mcg by mouth daily.     multivitamin tablet  Take 1 tablet by mouth daily.     nitroGLYCERIN 0.4 MG SL tablet  Commonly known as: NITROSTAT  Place 1 tablet (0.4 mg total) under the tongue every 5 (five) minutes as needed for chest pain.     ramipril 10 MG capsule  Commonly known as: ALTACE  Take 2 capsules (20 mg total) by mouth daily.     rosuvastatin 40 MG tablet  Commonly known as: CRESTOR  Take 1 tablet (40 mg total) by mouth daily.     spironolactone 25 MG tablet  Commonly known as: ALDACTONE  Take 1 tablet (25 mg total) by mouth daily.        Discharge Instructions    Amb Referral to Cardiac Rehabilitation  Complete by: As directed   Congestive Heart Failure: If diagnosis is Heart Failure, patient MUST meet each of the CMS criteria: 1. Left Ventricular Ejection Fraction </= 35% 2. NYHA class II-IV symptoms despite being on optimal heart failure therapy for at least 6 weeks. 3. Stable = have not had a recent (<6 weeks) or planned (<6 months) major cardiovascular hospitalization or procedure  Program Details: - Physician supervised classes - 1-3 classes per week over a 12-18 week period, generally for a total of 36 sessions  Physician Certification: I certify that the above Cardiac Rehabilitation treatment is medically necessary and is medically approved by me for treatment of this patient. The patient is willing and cooperative, able to ambulate and medically stable to participate in exercise rehabilitation. The participant's progress and Individualized Treatment Plan will be reviewed by the Medical Director, Cardiac Rehab staff and as indicated by the Referring/Ordering Physician.  Diagnosis: PCI     Diet - low sodium heart healthy  Complete by: As directed      Increase activity slowly  Complete  by: As directed           Follow-up Information    Follow up with Sueanne Margarita, MD.   Specialty: Cardiology   Why: The office will call   Contact information:   [place] [address] [address] [phone]       BRING ALL MEDICATIONS WITH YOU TO FOLLOW UP APPOINTMENTS  Time spent with patient to include physician time: 52 min Signed: Virl Axe, MD 04/06/2015, 10:07 AM Co-Sign MD Patient had asymptomatic bradycardia. Given recent revascularization angina, we could anticipate that the role of beta blockers in the context of chronic stable artery disease minimal as the revascularization itself should address his angina. Alternatively, nocturnal bradycardia isn't an issue anyway and his beta blockers could be continued. We will relay to Dr. [name] our decision to stop BB and begin aldactone, as he has not tolerated amlodipine She can then choose to pursue this course or reverse it

## 2015-04-17 ENCOUNTER — Other Ambulatory Visit: Payer: Self-pay | Admitting: Cardiology

## 2015-04-21 ENCOUNTER — Encounter: Payer: Self-pay | Admitting: Physician Assistant

## 2015-04-21 NOTE — Progress Notes (Signed)
Cardiology Office Note Date:  04/22/2015  Patient ID:  John Hinton, John Hinton [birth year], MRN [id number] PCP:  Irven Shelling, MD  Cardiologist:  [name]   Chief Complaint: f/u cath  History of Present Illness: John Hinton is a 69 y.o. male with history of CAD (s/p prior CABG ~2007; s/p DES to SVG-OM1-OM2 03/2015), possible ischemic cardiomyopathy with EF 46% by nuc 03/2015, HTN, dyslipidemia, chronic RBBB, asymptomatic bradycardia who presents for post-hospital follow-up. Recently seen in office for chest pain concerning for angina. BP had been running higher lately so Cardura was added. Nuclear stress test was abnormal (inferior ischemia; EF 46%) thus cath was performed demonstrating severe native 3V disease, all grafts patent except significant stenosis in the SVG-OM 1-OM 2 s/p DES. DAPT for at least 12 months was recommended. Recent LDL 58, Cr 1.38.   John Hinton comes in for follow-up today doing great. He is walking 45 minutes per day and says he actually feels like he could increase his activity level even further. He declined phase II cardiac rehab because he feels like he can enact this program in his own time (had been through it before after CABG). No recurrent CP or SOB. He drinks 2-3 drinks per day chronically (beer + wine). No BRBPR, melena, hematemesis. The patient mentions that two people in the hospital told him he should get tested for sleep apnea. I do not see this explicitly mentioned but Dr. Olin Pia addendum to the discharge summary does mention nocturnal bradycardia, cessation of BB, and initiation of aldactone.   Past Medical History  Diagnosis Date  . Hypertension   . Dyslipidemia   . ED (erectile dysfunction)   . Shoulder, capsulitis, adhesive     Left Shoulder  . BPH (benign prostatic hypertrophy)   . Arrhythmia     Post-op  . Fear of heights   . Coronary artery disease     a. s/p CABG ~2007 with LIMA to LAD, SVG seq to OM1 and OM2, SVG to PDA and SVG to  diag. b. Abnormal nuc 03/2015 - s/p DES to SVG-OM1-OM2.  Marland Kitchen RBBB   . Ischemic cardiomyopathy     a. EF 46% by nuc 03/2015.  . Bradycardia     a. H/o asymptomatic bradycardia.    Past Surgical History  Procedure Laterality Date  . Coronary artery bypass graft      w/LIMA to LAD, seq SVG to OM1 and OM2, SVG to PDA and SVG to Diagonal  . Cardiac catheterization N/A 04/05/2015    Procedure: Left Heart Cath and Coronary Angiography;  Surgeon: Sherren Mocha, MD;  Location: [facility];  Service: Cardiovascular;  Laterality: N/A;    Current Outpatient Prescriptions  Medication Sig Dispense Refill  . ALPRAZolam (XANAX) 0.5 MG tablet Take 0.5 mg by mouth 2 (two) times daily as needed for anxiety.    Marland Kitchen aspirin 81 MG tablet Take 81 mg by mouth daily.    . cholecalciferol (VITAMIN D) 1000 UNITS tablet Take 1,000 Units by mouth daily.    . clopidogrel (PLAVIX) 75 MG tablet Take 1 tablet (75 mg total) by mouth daily with breakfast. 30 tablet 11  . doxazosin (CARDURA) 2 MG tablet Take 1 tablet (2 mg total) by mouth daily. 30 tablet 11  . ezetimibe (ZETIA) 10 MG tablet Take 1 tablet (10 mg total) by mouth daily. 90 tablet 3  . folic acid (FOLVITE) 102 MCG tablet Take 400 mcg by mouth daily.    . Multiple Vitamin (MULTIVITAMIN) tablet Take  1 tablet by mouth daily.    . nitroGLYCERIN (NITROSTAT) 0.4 MG SL tablet Place 1 tablet (0.4 mg total) under the tongue every 5 (five) minutes as needed for chest pain. 25 tablet 12  . ramipril (ALTACE) 10 MG capsule TAKE 2 CAPSULES BY MOUTH DAILY 180 capsule 3  . rosuvastatin (CRESTOR) 40 MG tablet Take 1 tablet (40 mg total) by mouth daily. 90 tablet 3  . spironolactone (ALDACTONE) 25 MG tablet Take 1 tablet (25 mg total) by mouth daily. 30 tablet 11   No current facility-administered medications for this visit.    Allergies:   Amlodipine   Social History:  The patient  reports that he quit smoking about 31 years ago. His smoking use included Cigarettes.  He does not have any smokeless tobacco history on file. He reports that he drinks about 1.2 oz of alcohol per week. He reports that he does not use illicit drugs.   Family History:  The patient's family history includes Cancer in his sister; Heart disease in his father; Heart failure in his father; Lung cancer in his father and mother.  ROS:  Please see the history of present illness.  All other systems are reviewed and otherwise negative.   PHYSICAL EXAM:  VS:  BP 124/72 mmHg  Pulse 63  Ht '5\' 11"'  (1.803 m)  Wt 202 lb 12.8 oz (91.989 kg)  BMI 28.30 kg/m2 BMI: Body mass index is 28.3 kg/(m^2). Well nourished, well developed WM in no acute distress HEENT: normocephalic, atraumatic Neck: no JVD, carotid bruits or masses Cardiac:  normal S1, S2; RRR; no murmurs, rubs, or gallops Lungs:  clear to auscultation bilaterally, no wheezing, rhonchi or rales Abd: soft, nontender, no hepatomegaly, + BS MS: no deformity or atrophy Ext: no edema, left radial cath site without hematoma or ecchymosis, good pulse. Skin: warm and dry, no rash Neuro:  moves all extremities spontaneously, no focal abnormalities noted, follows commands Psych: euthymic mood, full affect   EKG:  Done today shows NSR 63bpm, chronic RBBB  Recent Labs: 03/28/2015: ALT 19; BUN 24*; Creatinine, Ser 1.38; Potassium 3.6; Sodium 142 04/05/2015: Hemoglobin 14.0; Platelets 147*  03/28/2015: Cholesterol 110; HDL 38.60*; LDL Cholesterol 58; Total CHOL/HDL Ratio 3; Triglycerides 66.0; VLDL 13.2   CrCl cannot be calculated (Patient has no serum creatinine result on file.).   Wt Readings from Last 3 Encounters:  04/22/15 202 lb 12.8 oz (91.989 kg)  04/06/15 209 lb 7 oz (95 kg)  03/28/15 206 lb (93.441 kg)     Other studies reviewed: Additional studies/records reviewed today include: summarized above  ASSESSMENT AND PLAN:  1. CAD s/p CABG, PCI as above - doing well post-PCI. Continue DAPT. We discussed cutting down on alcohol  because this can increase risk of stomach bleeding while on blood thinners. He is no longer on BB due to asymptomatic bradycardia. Continue statin. 2. Essential HTN - controlled on present regimen. Recheck BMET today given recent initiation of spironolactone and borderline elevated Cr on pre-cath labs. 3. Dyslipidemia - controlled on present regimen. 4. Ischemic cardiomyopathy - EF 46% by recent nuc. No CHF symptoms. Will defer to primary cardiologist about timing of reassessment of EF. 5. H/o asymptomatic bradycardia - reported nocturnal bradycardia in the hospital recently. The patient reports that two separate people in the hospital told him he should be tested for sleep apnea. While not explicitly documented, a sleep study is reasonable given HR dips overnight in the hospital and history of HTN/CAD.  Disposition: F/u with  Dr. [name] in 4 months.  Current medicines are reviewed at length with the patient today.  The patient did not have any concerns regarding medicines. Note although the patient received a TOC phone call I did not charge a TOC visit because I did not feel he met criteria. He was not recently admitted with NSTEMI, STEMI, or CHF and did not have a complicated hospitalization.  Raechel Ache PA-C 04/22/2015 8:45 AM     [facility] [address] [place]  [address] [phone] (office)  [phone] (fax)

## 2015-04-22 ENCOUNTER — Encounter: Payer: Self-pay | Admitting: Physician Assistant

## 2015-04-22 ENCOUNTER — Ambulatory Visit (INDEPENDENT_AMBULATORY_CARE_PROVIDER_SITE_OTHER): Payer: Medicare Other | Admitting: Physician Assistant

## 2015-04-22 VITALS — BP 124/72 | HR 63 | Ht 71.0 in | Wt 202.8 lb

## 2015-04-22 DIAGNOSIS — E785 Hyperlipidemia, unspecified: Secondary | ICD-10-CM

## 2015-04-22 DIAGNOSIS — Z9861 Coronary angioplasty status: Secondary | ICD-10-CM

## 2015-04-22 DIAGNOSIS — R001 Bradycardia, unspecified: Secondary | ICD-10-CM

## 2015-04-22 DIAGNOSIS — I10 Essential (primary) hypertension: Secondary | ICD-10-CM | POA: Diagnosis not present

## 2015-04-22 DIAGNOSIS — I2510 Atherosclerotic heart disease of native coronary artery without angina pectoris: Secondary | ICD-10-CM | POA: Diagnosis not present

## 2015-04-22 DIAGNOSIS — I255 Ischemic cardiomyopathy: Secondary | ICD-10-CM

## 2015-04-22 LAB — BASIC METABOLIC PANEL
BUN: 18 mg/dL (ref 6–23)
CHLORIDE: 101 meq/L (ref 96–112)
CO2: 26 mEq/L (ref 19–32)
Calcium: 9.9 mg/dL (ref 8.4–10.5)
Creatinine, Ser: 1.02 mg/dL (ref 0.40–1.50)
GFR: 77.34 mL/min (ref >60.00)
GLUCOSE: 104 mg/dL — AB (ref 70–99)
POTASSIUM: 4.1 meq/L (ref 3.5–5.1)
SODIUM: 137 meq/L (ref 135–145)

## 2015-04-22 MED ORDER — DOXAZOSIN MESYLATE 2 MG PO TABS: 2.0000 mg | ORAL_TABLET | Freq: Every day | ORAL | Status: DC

## 2015-04-22 MED ORDER — SPIRONOLACTONE 25 MG PO TABS: 25.0000 mg | ORAL_TABLET | Freq: Every day | ORAL | Status: DC

## 2015-04-22 MED ORDER — CLOPIDOGREL BISULFATE 75 MG PO TABS: 75.0000 mg | ORAL_TABLET | Freq: Every day | ORAL | Status: DC

## 2015-04-22 NOTE — Patient Instructions (Addendum)
Medication Instructions:  Your physician recommends that you continue on your current medications as directed. Please refer to the Current Medication list given to you today.   Labwork: TODAY:  BMET  Testing/Procedures: Your physician has recommended that you have a sleep study. This test records several body functions during sleep, including: brain activity, eye movement, oxygen and carbon dioxide blood levels, heart rate and rhythm, breathing rate and rhythm, the flow of air through your mouth and nose, snoring, body muscle movements, and chest and belly movement.    Follow-Up: Your physician wants you to follow-up in: 4 MONTHS WITH DR. Mallie Snooks will receive a reminder letter in the mail two months in advance. If you don't receive a letter, please call our office to schedule the follow-up appointment.

## 2015-05-23 ENCOUNTER — Encounter: Payer: Self-pay | Admitting: Cardiology

## 2015-07-11 ENCOUNTER — Ambulatory Visit (HOSPITAL_BASED_OUTPATIENT_CLINIC_OR_DEPARTMENT_OTHER): Payer: Medicare Other | Attending: Physician Assistant

## 2015-07-11 VITALS — Ht 71.0 in | Wt 204.0 lb

## 2015-07-11 DIAGNOSIS — I10 Essential (primary) hypertension: Secondary | ICD-10-CM | POA: Diagnosis not present

## 2015-07-11 DIAGNOSIS — R5383 Other fatigue: Secondary | ICD-10-CM | POA: Diagnosis not present

## 2015-07-11 DIAGNOSIS — I493 Ventricular premature depolarization: Secondary | ICD-10-CM | POA: Insufficient documentation

## 2015-07-11 DIAGNOSIS — R001 Bradycardia, unspecified: Secondary | ICD-10-CM | POA: Insufficient documentation

## 2015-07-11 DIAGNOSIS — G4733 Obstructive sleep apnea (adult) (pediatric): Secondary | ICD-10-CM | POA: Diagnosis not present

## 2015-07-12 ENCOUNTER — Encounter (HOSPITAL_BASED_OUTPATIENT_CLINIC_OR_DEPARTMENT_OTHER): Payer: Self-pay

## 2015-07-12 ENCOUNTER — Telehealth: Payer: Self-pay | Admitting: Cardiology

## 2015-07-12 DIAGNOSIS — G4733 Obstructive sleep apnea (adult) (pediatric): Secondary | ICD-10-CM

## 2015-07-12 HISTORY — DX: Obstructive sleep apnea (adult) (pediatric): G47.33

## 2015-07-12 NOTE — Telephone Encounter (Signed)
Please let patient know that they have significant sleep apnea and had successful CPAP titration and will be set up with CPAP unit.  Please let DME know that order is in EPIC.  Please set patient up for OV in 10 weeks 

## 2015-07-12 NOTE — Sleep Study (Addendum)
 Patient Name: John Hinton, John Hinton MRN: [id number] Study Date: 07/11/2015 Gender: Male D.O.B: 09/24/1945 Age (years): 69 Referring Provider: Dayna Dunn Interpreting Physician:   MD, ABSM RPSGT: Dubili, Fred  Weight (lbs): 204 Height (inches): 71 BMI: 28 Neck Size: 17.00  CLINICAL INFORMATION Sleep Study Type: Split Night CPAP Indication for sleep study: Fatigue, Hypertension   SLEEP STUDY TECHNIQUE As per the AASM Manual for the Scoring of Sleep and Associated Events v2.3 (April 2016) with a hypopnea requiring 4% desaturations. The channels recorded and monitored were frontal, central and occipital EEG, electrooculogram (EOG), submentalis EMG (chin), nasal and oral airflow, thoracic and abdominal wall motion, anterior tibialis EMG, snore microphone, electrocardiogram, and pulse oximetry. Continuous positive airway pressure (CPAP) was initiated when the patient met split night criteria and was titrated according to treat sleep-disordered breathing.  MEDICATIONS Medications taken by the patient : Xanax, ASA, Vit D, Plavix, Cardura, Zetia, Folvite, Altace, Crestor, Aldactone Medications administered by patient during sleep study : No sleep medicine administered.  RESPIRATORY PARAMETERS Diagnostic Total AHI (/hr): 16.5  RDI (/hr):16.5   OA Index (/hr): 13.3  CA Index (/hr): 0.0 REM AHI (/hr): 67.7  NREM AHI (/hr):10.5  Supine AHI (/hr):17.0  Non-supine AHI (/hr): 0.00 Min O2 Sat (%):87.00  Mean O2 (%): 95.09  Time below 88% (min):0.4    Titration Optimal Pressure (cm):8 AHI at Optimal Pressure (/hr):0.0 Min O2 at Optimal Pressure (%):95.0 Supine % at Optimal (%):13 Sleep % at Optimal (%):97    SLEEP ARCHITECTURE The recording time for the entire night was 412.3 minutes. During a baseline period of 234.7 minutes, the patient slept for 185.3 minutes in REM and nonREM, yielding a reduced sleep efficiency of 78.9%. Sleep onset after lights out was 12.4 minutes with a REM  latency of prolonged at 191.5 minutes. The patient spent 6.75% of the night in stage N1 sleep, 82.73% in stage N2 sleep, 0.00% in stage N3 and 10.53% in REM. During the titration period of 171.8 minutes, the patient slept for 150.5 minutes in REM and nonREM, yielding a sleep efficiency of 87.6%. Sleep onset after CPAP initiation was 7.9 minutes with a REM latency of 89.0 minutes. The patient spent 9.30% of the night in stage N1 sleep, 71.76% in stage N2 sleep, 0.00% in stage N3 and 18.94% in REM.  CARDIAC DATA The 2 lead EKG demonstrated sinus rhythm. The mean heart rate was 53.89 beats per minute. Other EKG findings include: PVCs.  LEG MOVEMENT DATA The total Periodic Limb Movements of Sleep (PLMS) were 7. The PLMS index was 1.25 .  IMPRESSIONS - Moderate obstructive sleep apnea occurred during the diagnostic portion of the study(AHI = 16.5/hour). An optimal PAP pressure was selected for this patient ( 8 cm of water) - No significant central sleep apnea occurred during the diagnostic portion of the study (CAI = 0.0/hour). - The patient had minimal or no oxygen desaturation during the diagnostic portion of the study (Min O2 = 87.00%) - No snoring was audible during the diagnostic portion of the study. - EKG findings include PVCs. - Clinically significant periodic limb movements did not occur during sleep.  DIAGNOSIS - Obstructive Sleep Apnea (327.23 [G47.33 ICD-10])  RECOMMENDATIONS - Trial of CPAP therapy on 8 cm H2O with a Medium size Fisher&Paykel Full Face Mask Simplus mask and heated humidification. - Avoid alcohol, sedatives and other CNS depressants that may worsen sleep apnea and disrupt normal sleep architecture. - Sleep hygiene should be reviewed to assess factors that may improve sleep quality. -   Weight management and regular exercise should be initiated or continued. - Return to Sleep Center for re-evaluation after 10 weeks of therapy   , R Diplomate, American  Board of Sleep Medicine  ELECTRONICALLY SIGNED ON:  07/12/2015, 1:27 PM [facility] PH: [phone]   FX: [phone] ACCREDITED BY THE AMERICAN ACADEMY OF SLEEP MEDICINE  

## 2015-07-12 NOTE — Addendum Note (Signed)
Addended by: Fransico Him R on: 07/12/2015 01:39 PM   Modules accepted: Orders

## 2015-07-16 NOTE — Telephone Encounter (Signed)
Left message for patient to call back  

## 2015-07-16 NOTE — Telephone Encounter (Signed)
Patient has been informed of results and stated verbal understanding.   He requested that I wait until January 9th to let [facility] know to call him because he is out of town through January 5th. I have let him know to expect a call when he gets back into town.    Once he is set up with a Unit we will schedule 10 week follow-up.

## 2015-08-05 DIAGNOSIS — F411 Generalized anxiety disorder: Secondary | ICD-10-CM | POA: Diagnosis not present

## 2015-08-05 DIAGNOSIS — F4323 Adjustment disorder with mixed anxiety and depressed mood: Secondary | ICD-10-CM | POA: Diagnosis not present

## 2015-08-05 DIAGNOSIS — F40228 Other natural environment type phobia: Secondary | ICD-10-CM | POA: Diagnosis not present

## 2015-08-08 ENCOUNTER — Telehealth: Payer: Self-pay | Admitting: Cardiology

## 2015-08-08 NOTE — Telephone Encounter (Signed)
Messages have been sent to [facility].  I have left a message for patient to call me to discuss.

## 2015-08-08 NOTE — Telephone Encounter (Signed)
New Message  Pt calling to speak w/ RN- following up on sleep study- was told that he would be contacted about CPAP. Please call back and discuss.

## 2015-08-09 NOTE — Telephone Encounter (Signed)
Patient returned my call leaving a voicemail stating that he understood someone would be calling him from [facility].  He stated that he was appreciative of my call and that he would call me back if he had any other questions or concerns.

## 2015-08-12 DIAGNOSIS — L821 Other seborrheic keratosis: Secondary | ICD-10-CM | POA: Diagnosis not present

## 2015-08-12 DIAGNOSIS — L570 Actinic keratosis: Secondary | ICD-10-CM | POA: Diagnosis not present

## 2015-08-12 DIAGNOSIS — D1801 Hemangioma of skin and subcutaneous tissue: Secondary | ICD-10-CM | POA: Diagnosis not present

## 2015-08-14 DIAGNOSIS — F411 Generalized anxiety disorder: Secondary | ICD-10-CM | POA: Diagnosis not present

## 2015-08-14 DIAGNOSIS — F40228 Other natural environment type phobia: Secondary | ICD-10-CM | POA: Diagnosis not present

## 2015-08-14 DIAGNOSIS — F4323 Adjustment disorder with mixed anxiety and depressed mood: Secondary | ICD-10-CM | POA: Diagnosis not present

## 2015-08-21 DIAGNOSIS — F411 Generalized anxiety disorder: Secondary | ICD-10-CM | POA: Diagnosis not present

## 2015-08-21 DIAGNOSIS — F4323 Adjustment disorder with mixed anxiety and depressed mood: Secondary | ICD-10-CM | POA: Diagnosis not present

## 2015-08-21 DIAGNOSIS — F40228 Other natural environment type phobia: Secondary | ICD-10-CM | POA: Diagnosis not present

## 2015-08-28 DIAGNOSIS — F411 Generalized anxiety disorder: Secondary | ICD-10-CM | POA: Diagnosis not present

## 2015-08-28 DIAGNOSIS — F4323 Adjustment disorder with mixed anxiety and depressed mood: Secondary | ICD-10-CM | POA: Diagnosis not present

## 2015-08-28 DIAGNOSIS — F40228 Other natural environment type phobia: Secondary | ICD-10-CM | POA: Diagnosis not present

## 2015-09-04 ENCOUNTER — Ambulatory Visit: Payer: Medicare Other | Admitting: Cardiology

## 2015-09-16 NOTE — Progress Notes (Signed)
Cardiology Office Note   Date:  09/17/2015   ID:  Ammar Moffatt, DOB 11/12/1945, MRN [id number]  PCP:  Irven Shelling, MD    Chief Complaint  Patient presents with  . Coronary Artery Disease  . Hypertension  . Sleep Apnea      History of Present Illness: John Hinton is a 70 y.o. male with a history of ASCAD s/p CABG, HTN and dyslipidemia who presents today for followup. When I last saw him he was having CP.  Nuclear stress test showed peri-infarct ischemia in the inferolateral region and repeat cath showed Severe 3 vessel CAD with total occlusion of the RCA, LAD, and LCx, and severe left main stenosis.  There was patency of the SVG-diagonal (fills the LAD), SVG-PDA, and LIMA-LAD with severe stenosis of the SVG-OM1 and OM2 treated successfully with PCI using distal embolic protection and a DES platform.  He underwent a PSG recently for nocturnal bradycardia and was noted to have moderate OSA with an AHI of 16.5/hr and underwent CPAP titration to 8cm H2O.  He is doing well with his device.  He tolerates his full face mask and feels the pressure is adequate.  He has no mouth dryness or nasal congestion.  Since going on CPAP he feels more rested in the am and has no daytime sleepiness.   He presents today doing well.  He denies any chest pain or pressure.  He has chronic mild SOB or DOE with walking which has not changed.  He denies any palpitations, dizziness or syncope.    Past Medical History  Diagnosis Date  . Hypertension   . Dyslipidemia   . ED (erectile dysfunction)   . Shoulder, capsulitis, adhesive     Left Shoulder  . BPH (benign prostatic hypertrophy)   . Arrhythmia     Post-op  . Fear of heights   . Coronary artery disease     a. s/p CABG ~2007 with LIMA to LAD, SVG seq to OM1 and OM2, SVG to PDA and SVG to diag. b. Abnormal nuc 03/2015 - s/p DES to SVG-OM1-OM2.  Marland Kitchen RBBB   . Ischemic cardiomyopathy     a. EF 46% by nuc 03/2015.  . Bradycardia      a. H/o asymptomatic bradycardia.  Marland Kitchen OSA (obstructive sleep apnea) 07/12/2015    Moderate OSA with AHI 16.5/hr now on CPAP at 8cm H2O    Past Surgical History  Procedure Laterality Date  . Coronary artery bypass graft      w/LIMA to LAD, seq SVG to OM1 and OM2, SVG to PDA and SVG to Diagonal  . Cardiac catheterization N/A 04/05/2015    Procedure: Left Heart Cath and Coronary Angiography;  Surgeon: Sherren Mocha, MD;  Location: [facility];  Service: Cardiovascular;  Laterality: N/A;     Current Outpatient Prescriptions  Medication Sig Dispense Refill  . ALPRAZolam (XANAX) 0.5 MG tablet Take 0.5 mg by mouth 2 (two) times daily as needed for anxiety.    Marland Kitchen aspirin 81 MG tablet Take 81 mg by mouth daily.    . cholecalciferol (VITAMIN D) 1000 UNITS tablet Take 1,000 Units by mouth daily.    . clopidogrel (PLAVIX) 75 MG tablet Take 1 tablet (75 mg total) by mouth daily with breakfast. 90 tablet 3  . doxazosin (CARDURA) 2 MG tablet Take 1 tablet (2 mg total) by mouth daily. 90 tablet 3  .  ezetimibe (ZETIA) 10 MG tablet Take 1 tablet (10 mg total) by mouth daily. 90 tablet 3  . folic acid (FOLVITE) 027 MCG tablet Take 400 mcg by mouth daily.    . Multiple Vitamin (MULTIVITAMIN) tablet Take 1 tablet by mouth daily.    . nitroGLYCERIN (NITROSTAT) 0.4 MG SL tablet Place 1 tablet (0.4 mg total) under the tongue every 5 (five) minutes as needed for chest pain. 25 tablet 12  . ramipril (ALTACE) 10 MG capsule TAKE 2 CAPSULES BY MOUTH DAILY 180 capsule 3  . rosuvastatin (CRESTOR) 40 MG tablet Take 1 tablet (40 mg total) by mouth daily. 90 tablet 3  . spironolactone (ALDACTONE) 25 MG tablet Take 1 tablet (25 mg total) by mouth daily. 90 tablet 3   No current facility-administered medications for this visit.    Allergies:   Amlodipine    Social History:  The patient  reports that he quit smoking about 32 years ago. His smoking use included Cigarettes. He does not have any smokeless tobacco  history on file. He reports that he drinks about 1.2 oz of alcohol per week. He reports that he does not use illicit drugs.   Family History:  The patient's family history includes Cancer in his sister; Heart disease in his father; Heart failure in his father; Lung cancer in his father and mother.    ROS:  Please see the history of present illness.   Otherwise, review of systems are positive for none.   All other systems are reviewed and negative.    PHYSICAL EXAM: VS:  BP 122/72 mmHg  Pulse 60  Ht '5\' 11"'$  (1.803 m)  Wt 204 lb 6.4 oz (92.715 kg)  BMI 28.52 kg/m2 , BMI Body mass index is 28.52 kg/(m^2). GEN: Well nourished, well developed, in no acute distress HEENT: normal Neck: no JVD, carotid bruits, or masses Cardiac: RRR; no murmurs, rubs, or gallops,no edema  Respiratory:  clear to auscultation bilaterally, normal work of breathing GI: soft, nontender, nondistended, + BS MS: no deformity or atrophy Skin: warm and dry, no rash Neuro:  Strength and sensation are intact Psych: euthymic mood, full affect   EKG:  EKG is not ordered today.    Recent Labs: 03/28/2015: ALT 19 04/05/2015: Hemoglobin 14.0; Platelets 147* 04/22/2015: BUN 18; Creatinine, Ser 1.02; Potassium 4.1; Sodium 137    Lipid Panel    Component Value Date/Time   CHOL 110 03/28/2015 0752   TRIG 66.0 03/28/2015 0752   HDL 38.60* 03/28/2015 0752   CHOLHDL 3 03/28/2015 0752   VLDL 13.2 03/28/2015 0752   LDLCALC 58 03/28/2015 0752      Wt Readings from Last 3 Encounters:  09/17/15 204 lb 6.4 oz (92.715 kg)  07/11/15 204 lb (92.534 kg)  04/22/15 202 lb 12.8 oz (91.989 kg)    ASSESSMENT AND PLAN:  1. CAD s/p CABG, PCI as above - doing well post-PCI. Continue DAPT. He is no longer on BB due to asymptomatic bradycardia. Continue statin. 2. Essential HTN - controlled on present regimen.  3. Dyslipidemia - controlled on present regimen.  LDL at goal at 58.  Continue statin and Zetia. 4. Ischemic  cardiomyopathy - EF 46% by recent nuc. No CHF symptoms. Check echo to see if EF has improved after percutaneous revascularization.  Continue aldactone/ACE I.  No BB due to bradycardia.   5. H/o asymptomatic bradycardia - reported nocturnal bradycardia in the hospital.  6. Moderate OSA with AHI 16.5/hr now on CPAP at 8cm H2O and  tolerating well.  Patient has been using and benefiting from CPAP use and will continue to benefit from therapy. His d/l today showed an AHI of 2.1/hr on 8cm H2O with 100% compliance in using more than 4 hours nightly.     Current medicines are reviewed at length with the patient today.  The patient does not have concerns regarding medicines.  The following changes have been made:  no change  Labs/ tests ordered today: See above Assessment and Plan No orders of the defined types were placed in this encounter.     Disposition:   FU with me in 6 months  Signed, Sueanne Margarita, MD  09/17/2015 8:49 AM    [facility] [place], [place], [place]  [address] Phone: [phone]; Fax: [phone]

## 2015-09-17 ENCOUNTER — Encounter: Payer: Self-pay | Admitting: Cardiology

## 2015-09-17 ENCOUNTER — Ambulatory Visit (INDEPENDENT_AMBULATORY_CARE_PROVIDER_SITE_OTHER): Payer: Medicare Other | Admitting: Cardiology

## 2015-09-17 VITALS — BP 122/72 | HR 60 | Ht 71.0 in | Wt 204.4 lb

## 2015-09-17 DIAGNOSIS — I420 Dilated cardiomyopathy: Secondary | ICD-10-CM | POA: Insufficient documentation

## 2015-09-17 DIAGNOSIS — G4733 Obstructive sleep apnea (adult) (pediatric): Secondary | ICD-10-CM | POA: Diagnosis not present

## 2015-09-17 DIAGNOSIS — I2510 Atherosclerotic heart disease of native coronary artery without angina pectoris: Secondary | ICD-10-CM | POA: Diagnosis not present

## 2015-09-17 DIAGNOSIS — R001 Bradycardia, unspecified: Secondary | ICD-10-CM | POA: Diagnosis not present

## 2015-09-17 DIAGNOSIS — I10 Essential (primary) hypertension: Secondary | ICD-10-CM

## 2015-09-17 DIAGNOSIS — E785 Hyperlipidemia, unspecified: Secondary | ICD-10-CM

## 2015-09-17 HISTORY — DX: Dilated cardiomyopathy: I42.0

## 2015-09-17 MED ORDER — EZETIMIBE 10 MG PO TABS: 10.0000 mg | ORAL_TABLET | Freq: Every day | ORAL | Status: DC

## 2015-09-17 MED ORDER — ROSUVASTATIN CALCIUM 40 MG PO TABS: 40.0000 mg | ORAL_TABLET | Freq: Every day | ORAL | Status: DC

## 2015-09-17 NOTE — Patient Instructions (Signed)
Medication Instructions:  Your physician recommends that you continue on your current medications as directed. Please refer to the Current Medication list given to you today.   Labwork: None  Testing/Procedures: Your physician has requested that you have an echocardiogram. Echocardiography is a painless test that uses sound waves to create images of your heart. It provides your doctor with information about the size and shape of your heart and how well your heart's chambers and valves are working. This procedure takes approximately one hour. There are no restrictions for this procedure.  Follow-Up: Your physician wants you to follow-up in: 6 months with Dr. Turner. You will receive a reminder letter in the mail two months in advance. If you don't receive a letter, please call our office to schedule the follow-up appointment.   Any Other Special Instructions Will Be Listed Below (If Applicable).     If you need a refill on your cardiac medications before your next appointment, please call your pharmacy.   

## 2015-09-19 ENCOUNTER — Encounter: Payer: Self-pay | Admitting: Cardiology

## 2015-09-23 ENCOUNTER — Encounter: Payer: Self-pay | Admitting: Cardiology

## 2015-09-26 DIAGNOSIS — F4323 Adjustment disorder with mixed anxiety and depressed mood: Secondary | ICD-10-CM | POA: Diagnosis not present

## 2015-09-26 DIAGNOSIS — F411 Generalized anxiety disorder: Secondary | ICD-10-CM | POA: Diagnosis not present

## 2015-09-26 DIAGNOSIS — F40228 Other natural environment type phobia: Secondary | ICD-10-CM | POA: Diagnosis not present

## 2015-10-01 ENCOUNTER — Other Ambulatory Visit: Payer: Self-pay

## 2015-10-01 ENCOUNTER — Ambulatory Visit (HOSPITAL_COMMUNITY): Payer: Medicare Other | Attending: Internal Medicine

## 2015-10-01 DIAGNOSIS — I119 Hypertensive heart disease without heart failure: Secondary | ICD-10-CM | POA: Diagnosis not present

## 2015-10-01 DIAGNOSIS — Z87891 Personal history of nicotine dependence: Secondary | ICD-10-CM | POA: Insufficient documentation

## 2015-10-01 DIAGNOSIS — I420 Dilated cardiomyopathy: Secondary | ICD-10-CM | POA: Diagnosis not present

## 2015-10-01 DIAGNOSIS — I358 Other nonrheumatic aortic valve disorders: Secondary | ICD-10-CM | POA: Insufficient documentation

## 2015-10-01 DIAGNOSIS — E785 Hyperlipidemia, unspecified: Secondary | ICD-10-CM | POA: Diagnosis not present

## 2015-10-09 DIAGNOSIS — F40228 Other natural environment type phobia: Secondary | ICD-10-CM | POA: Diagnosis not present

## 2015-10-09 DIAGNOSIS — F4323 Adjustment disorder with mixed anxiety and depressed mood: Secondary | ICD-10-CM | POA: Diagnosis not present

## 2015-10-09 DIAGNOSIS — F411 Generalized anxiety disorder: Secondary | ICD-10-CM | POA: Diagnosis not present

## 2015-11-11 ENCOUNTER — Encounter: Payer: Self-pay | Admitting: Cardiology

## 2015-11-19 DIAGNOSIS — F40228 Other natural environment type phobia: Secondary | ICD-10-CM | POA: Diagnosis not present

## 2015-11-19 DIAGNOSIS — F411 Generalized anxiety disorder: Secondary | ICD-10-CM | POA: Diagnosis not present

## 2015-11-19 DIAGNOSIS — F4323 Adjustment disorder with mixed anxiety and depressed mood: Secondary | ICD-10-CM | POA: Diagnosis not present

## 2015-11-26 DIAGNOSIS — F411 Generalized anxiety disorder: Secondary | ICD-10-CM | POA: Diagnosis not present

## 2015-11-26 DIAGNOSIS — F40228 Other natural environment type phobia: Secondary | ICD-10-CM | POA: Diagnosis not present

## 2015-11-26 DIAGNOSIS — F4323 Adjustment disorder with mixed anxiety and depressed mood: Secondary | ICD-10-CM | POA: Diagnosis not present

## 2015-12-24 DIAGNOSIS — F411 Generalized anxiety disorder: Secondary | ICD-10-CM | POA: Diagnosis not present

## 2015-12-24 DIAGNOSIS — F4323 Adjustment disorder with mixed anxiety and depressed mood: Secondary | ICD-10-CM | POA: Diagnosis not present

## 2015-12-24 DIAGNOSIS — F40228 Other natural environment type phobia: Secondary | ICD-10-CM | POA: Diagnosis not present

## 2016-02-01 ENCOUNTER — Other Ambulatory Visit: Payer: Self-pay | Admitting: Cardiology

## 2016-02-03 NOTE — Telephone Encounter (Signed)
doxazosin (CARDURA) 2 MG tablet  Medication   Date: 04/22/2015  Department: [facility] [facility]  Ordering/Authorizing: Charlie Pitter, PA-C      Order Providers    Prescribing Provider Encounter Provider   Charlie Pitter, PA-C Charlie Pitter, PA-C    Supervision Information    Supervising Provider Type of Supervision   Jolaine Artist, MD Supervision Required    Medication Detail      Disp Refills Start End     doxazosin (CARDURA) 2 MG tablet 90 tablet 3 04/22/2015     Sig - Route: Take 1 tablet (2 mg total) by mouth daily. - Oral    E-Prescribing Status: Receipt confirmed by pharmacy (04/22/2015 9:02 AM EDT)     Pharmacy    [id number] - [place], [address] AT [address]

## 2016-02-05 ENCOUNTER — Encounter: Payer: Self-pay | Admitting: Cardiology

## 2016-03-19 ENCOUNTER — Ambulatory Visit: Payer: Medicare Other | Admitting: Cardiology

## 2016-03-24 ENCOUNTER — Encounter: Payer: Self-pay | Admitting: Cardiology

## 2016-03-26 ENCOUNTER — Encounter: Payer: Self-pay | Admitting: Cardiology

## 2016-03-26 ENCOUNTER — Ambulatory Visit (INDEPENDENT_AMBULATORY_CARE_PROVIDER_SITE_OTHER): Payer: Medicare Other | Admitting: Cardiology

## 2016-03-26 VITALS — BP 134/82 | HR 64 | Ht 71.0 in | Wt 209.1 lb

## 2016-03-26 DIAGNOSIS — Z23 Encounter for immunization: Secondary | ICD-10-CM | POA: Diagnosis not present

## 2016-03-26 DIAGNOSIS — G4733 Obstructive sleep apnea (adult) (pediatric): Secondary | ICD-10-CM | POA: Diagnosis not present

## 2016-03-26 DIAGNOSIS — I2510 Atherosclerotic heart disease of native coronary artery without angina pectoris: Secondary | ICD-10-CM

## 2016-03-26 DIAGNOSIS — I10 Essential (primary) hypertension: Secondary | ICD-10-CM | POA: Diagnosis not present

## 2016-03-26 DIAGNOSIS — E785 Hyperlipidemia, unspecified: Secondary | ICD-10-CM

## 2016-03-26 LAB — LIPID PANEL
CHOL/HDL RATIO: 2.2 ratio (ref <=5.0)
Cholesterol: 141 mg/dL (ref 125–200)
HDL: 63 mg/dL (ref >=40)
LDL CALC: 64 mg/dL (ref <130)
TRIGLYCERIDES: 70 mg/dL (ref <150)
VLDL: 14 mg/dL (ref <30)

## 2016-03-26 LAB — HEPATIC FUNCTION PANEL
ALK PHOS: 58 U/L (ref 40–115)
ALT: 19 U/L (ref 9–46)
AST: 20 U/L (ref 10–35)
Albumin: 4.9 g/dL (ref 3.6–5.1)
BILIRUBIN DIRECT: 0.1 mg/dL (ref <=0.2)
BILIRUBIN INDIRECT: 0.3 mg/dL (ref 0.2–1.2)
BILIRUBIN TOTAL: 0.4 mg/dL (ref 0.2–1.2)
TOTAL PROTEIN: 7.6 g/dL (ref 6.1–8.1)

## 2016-03-26 LAB — BASIC METABOLIC PANEL
BUN: 20 mg/dL (ref 7–25)
CALCIUM: 9.5 mg/dL (ref 8.6–10.3)
CO2: 24 mmol/L (ref 20–31)
CREATININE: 1.01 mg/dL (ref 0.70–1.25)
Chloride: 104 mmol/L (ref 98–110)
Glucose, Bld: 95 mg/dL (ref 65–99)
Potassium: 4.1 mmol/L (ref 3.5–5.3)
SODIUM: 140 mmol/L (ref 135–146)

## 2016-03-26 MED ORDER — ROSUVASTATIN CALCIUM 40 MG PO TABS: 40.0000 mg | ORAL_TABLET | Freq: Every day | ORAL | 3 refills | Status: DC

## 2016-03-26 MED ORDER — EZETIMIBE 10 MG PO TABS
10.0000 mg | ORAL_TABLET | Freq: Every day | ORAL | 3 refills | Status: DC
Start: 2016-03-26 — End: 2017-02-12

## 2016-03-26 NOTE — Progress Notes (Signed)
Cardiology Office Note    Date:  03/26/2016   ID:  [name], DOB [birth year], MRN [id number]  PCP:  Irven Shelling, MD  Cardiologist:  Fransico Him, MD   Chief Complaint  Patient presents with  . Coronary Artery Disease  . Hypertension  . Sleep Apnea  . Hyperlipidemia    History of Present Illness:  John Hinton is a 70 y.o. male with a history of ASCAD s/p CABG with repeat cath 03/2015 showing Severe 3 vessel CAD with total occlusion of the RCA, LAD, and LCx, and severe left main stenosis.  There was patency of the SVG-diagonal (fills the LAD), SVG-PDA, and LIMA-LAD with severe stenosis of the SVG-OM1 and OM2 treated successfully with PCI using distal embolic protection and a DES platform.  He also has HTN and dyslipidemia.   He also has OSA and underwent a PSG for nocturnal bradycardia and was noted to have moderate OSA with an AHI of 16.5/hr and is on CPAP at 8cm H2O.  He presents today for followup.   He is doing well with his device.  He tolerates his full face mask and feels the pressure is adequate.  He has no mouth dryness or nasal congestion.  Since going on CPAP he feels more rested in the am and has no daytime sleepiness.    He denies any chest pain or pressure.  He has chronic mild SOB or DOE with walking which has not changed.  He denies any palpitations, LE edema, dizziness except for standing too quickly or syncope.  He denies any claudication symptoms.    Past Medical History:  Diagnosis Date  . Arrhythmia    Post-op  . BPH (benign prostatic hypertrophy)   . Bradycardia    a. H/o asymptomatic bradycardia.  . Coronary artery disease    a. s/p CABG ~2007 with LIMA to LAD, SVG seq to OM1 and OM2, SVG to PDA and SVG to diag. b. Abnormal nuc 03/2015 - s/p DES to SVG-OM1-OM2.  Marland Kitchen DCM (dilated cardiomyopathy) ([facility]) 09/17/2015  . Dyslipidemia   . ED (erectile dysfunction)   . Fear of heights   . Hypertension   . Ischemic cardiomyopathy    a. EF 46% by nuc  03/2015.  Marland Kitchen OSA (obstructive sleep apnea) 07/12/2015   Moderate OSA with AHI 16.5/hr now on CPAP at 8cm H2O  . RBBB   . Shoulder, capsulitis, adhesive    Left Shoulder    Past Surgical History:  Procedure Laterality Date  . CARDIAC CATHETERIZATION N/A 04/05/2015   Procedure: Left Heart Cath and Coronary Angiography;  Surgeon: Sherren Mocha, MD;  Location: [facility];  Service: Cardiovascular;  Laterality: N/A;  . CORONARY ARTERY BYPASS GRAFT     w/LIMA to LAD, seq SVG to OM1 and OM2, SVG to PDA and SVG to Diagonal    Current Medications: Outpatient Medications Prior to Visit  Medication Sig Dispense Refill  . ALPRAZolam (XANAX) 0.5 MG tablet Take 0.5 mg by mouth 2 (two) times daily as needed for anxiety.    Marland Kitchen aspirin 81 MG tablet Take 81 mg by mouth daily.    . cholecalciferol (VITAMIN D) 1000 UNITS tablet Take 1,000 Units by mouth daily.    . clopidogrel (PLAVIX) 75 MG tablet Take 1 tablet (75 mg total) by mouth daily with breakfast. 90 tablet 3  . doxazosin (CARDURA) 2 MG tablet Take 1 tablet (2 mg total) by mouth daily. 90 tablet 3  . ezetimibe (ZETIA) 10 MG tablet Take  1 tablet (10 mg total) by mouth daily. 90 tablet 3  . folic acid (FOLVITE) 182 MCG tablet Take 400 mcg by mouth daily.    . Multiple Vitamin (MULTIVITAMIN) tablet Take 1 tablet by mouth daily.    . nitroGLYCERIN (NITROSTAT) 0.4 MG SL tablet Place 1 tablet (0.4 mg total) under the tongue every 5 (five) minutes as needed for chest pain. 25 tablet 12  . ramipril (ALTACE) 10 MG capsule TAKE 2 CAPSULES BY MOUTH DAILY 180 capsule 3  . rosuvastatin (CRESTOR) 40 MG tablet Take 1 tablet (40 mg total) by mouth daily. 90 tablet 3  . spironolactone (ALDACTONE) 25 MG tablet Take 1 tablet (25 mg total) by mouth daily. 90 tablet 3   No facility-administered medications prior to visit.      Allergies:   Amlodipine   Social History   Social History  . Marital status: Married    Spouse name: N/A  . Number of  children: N/A  . Years of education: N/A   Social History Main Topics  . Smoking status: Former Smoker    Types: Cigarettes    Quit date: 07/21/1983  . Smokeless tobacco: None  . Alcohol use 1.2 oz/week    2 Cans of beer per week     Comment: daily  . Drug use: No  . Sexual activity: Not Asked   Other Topics Concern  . None   Social History Narrative  . None     Family History:  The patient's family history includes Cancer in his sister; Heart disease in his father; Heart failure in his father; Lung cancer in his father and mother.   ROS:   Please see the history of present illness.    ROS All other systems reviewed and are negative.   PHYSICAL EXAM:   VS:  BP 134/82   Pulse 64   Ht '5\' 11"'$  (1.803 m)   Wt 209 lb 1.9 oz (94.9 kg)   SpO2 97%   BMI 29.17 kg/m    GEN: Well nourished, well developed, in no acute distress  HEENT: normal  Neck: no JVD, carotid bruits, or masses Cardiac: RRR; no murmurs, rubs, or gallops,no edema.  Intact distal pulses bilaterally.  Respiratory:  clear to auscultation bilaterally, normal work of breathing GI: soft, nontender, nondistended, + BS MS: no deformity or atrophy  Skin: warm and dry, no rash Neuro:  Alert and Oriented x 3, Strength and sensation are intact Psych: euthymic mood, full affect  Wt Readings from Last 3 Encounters:  03/26/16 209 lb 1.9 oz (94.9 kg)  09/17/15 204 lb 6.4 oz (92.7 kg)  07/11/15 204 lb (92.5 kg)      Studies/Labs Reviewed:   EKG:  EKG is not ordered today.    Recent Labs: 03/28/2015: ALT 19 04/05/2015: Hemoglobin 14.0; Platelets 147 04/22/2015: BUN 18; Creatinine, Ser 1.02; Potassium 4.1; Sodium 137   Lipid Panel    Component Value Date/Time   CHOL 110 03/28/2015 0752   TRIG 66.0 03/28/2015 0752   HDL 38.60 (L) 03/28/2015 0752   CHOLHDL 3 03/28/2015 0752   VLDL 13.2 03/28/2015 0752   LDLCALC 58 03/28/2015 0752    Additional studies/ records that were reviewed today include:   none    ASSESSMENT:    1. Coronary artery disease involving native coronary artery of native heart without angina pectoris   2. Essential hypertension, benign   3. OSA (obstructive sleep apnea)   4. Hyperlipemia      PLAN:  In order of problems listed above:  1. ASCAD s/p remote CABG and PCI of SVG to OM1/OM2.  He has not had any further angina. Continue ASA/Plavix/statin. 2. HTN - BP controlled on current meds.  Continue Cardura/ACE I and aldactone.  Check BMET. OSA - the patient is tolerating PAP therapy well without any problems. The PAP download was reviewed today and showed an AHI of 1/hr on 8 cm H2O with 100% compliance in using more than 4 hours nightly.  The patient has been using and benefiting from CPAP use and will continue to benefit from therapy.  Hyperlipidemia with LDL goal < 70.  Continue statin/Zetia. Check FLp and ALT.      Medication Adjustments/Labs and Tests Ordered: Current medicines are reviewed at length with the patient today.  Concerns regarding medicines are outlined above.  Medication changes, Labs and Tests ordered today are listed in the Patient Instructions below.  There are no Patient Instructions on file for this visit.   Signed, Fransico Him, MD  03/26/2016 9:52 AM    [facility] [place], [place], [place]  [address] Phone: [phone]; Fax: [phone]

## 2016-03-26 NOTE — Patient Instructions (Signed)

## 2016-04-16 ENCOUNTER — Other Ambulatory Visit: Payer: Self-pay | Admitting: Physician Assistant

## 2016-04-17 DIAGNOSIS — N401 Enlarged prostate with lower urinary tract symptoms: Secondary | ICD-10-CM | POA: Diagnosis not present

## 2016-04-17 DIAGNOSIS — F40241 Acrophobia: Secondary | ICD-10-CM | POA: Diagnosis not present

## 2016-04-17 DIAGNOSIS — I2510 Atherosclerotic heart disease of native coronary artery without angina pectoris: Secondary | ICD-10-CM | POA: Diagnosis not present

## 2016-04-17 DIAGNOSIS — E7800 Pure hypercholesterolemia, unspecified: Secondary | ICD-10-CM | POA: Diagnosis not present

## 2016-04-17 DIAGNOSIS — I10 Essential (primary) hypertension: Secondary | ICD-10-CM | POA: Diagnosis not present

## 2016-04-17 DIAGNOSIS — Z0000 Encounter for general adult medical examination without abnormal findings: Secondary | ICD-10-CM | POA: Diagnosis not present

## 2016-04-17 DIAGNOSIS — G4733 Obstructive sleep apnea (adult) (pediatric): Secondary | ICD-10-CM | POA: Diagnosis not present

## 2016-04-17 DIAGNOSIS — I255 Ischemic cardiomyopathy: Secondary | ICD-10-CM | POA: Diagnosis not present

## 2016-04-17 DIAGNOSIS — Z1389 Encounter for screening for other disorder: Secondary | ICD-10-CM | POA: Diagnosis not present

## 2016-04-19 ENCOUNTER — Other Ambulatory Visit: Payer: Self-pay | Admitting: Physician Assistant

## 2016-04-20 ENCOUNTER — Other Ambulatory Visit: Payer: Self-pay | Admitting: Cardiology

## 2016-04-20 MED ORDER — RAMIPRIL 10 MG PO CAPS: 20.0000 mg | ORAL_CAPSULE | Freq: Every day | ORAL | 3 refills | Status: DC

## 2016-04-20 NOTE — Telephone Encounter (Signed)
Pt's Rx was sent to pt's pharmacy as requested. Confirmation received.  °

## 2016-04-20 NOTE — Telephone Encounter (Signed)
New message       *STAT* If patient is at the pharmacy, call can be transferred to refill team.   1. Which medications need to be refilled? (please list name of each medication and dose if known) ramipril '10mg'$  2. Which pharmacy/location (including street and city if local pharmacy) is medication to be sent to? [facility] 3. Do they need a 30 day or 90 day supply? 90 day

## 2016-05-06 DIAGNOSIS — F4323 Adjustment disorder with mixed anxiety and depressed mood: Secondary | ICD-10-CM | POA: Diagnosis not present

## 2016-05-06 DIAGNOSIS — F40228 Other natural environment type phobia: Secondary | ICD-10-CM | POA: Diagnosis not present

## 2016-05-06 DIAGNOSIS — F411 Generalized anxiety disorder: Secondary | ICD-10-CM | POA: Diagnosis not present

## 2016-06-03 ENCOUNTER — Other Ambulatory Visit: Payer: Self-pay | Admitting: Physician Assistant

## 2016-07-27 DIAGNOSIS — L821 Other seborrheic keratosis: Secondary | ICD-10-CM | POA: Diagnosis not present

## 2016-07-27 DIAGNOSIS — L814 Other melanin hyperpigmentation: Secondary | ICD-10-CM | POA: Diagnosis not present

## 2016-07-27 DIAGNOSIS — D225 Melanocytic nevi of trunk: Secondary | ICD-10-CM | POA: Diagnosis not present

## 2016-08-30 ENCOUNTER — Other Ambulatory Visit: Payer: Self-pay | Admitting: Cardiology

## 2016-11-12 DIAGNOSIS — F40228 Other natural environment type phobia: Secondary | ICD-10-CM | POA: Diagnosis not present

## 2016-11-12 DIAGNOSIS — F4323 Adjustment disorder with mixed anxiety and depressed mood: Secondary | ICD-10-CM | POA: Diagnosis not present

## 2016-11-12 DIAGNOSIS — F411 Generalized anxiety disorder: Secondary | ICD-10-CM | POA: Diagnosis not present

## 2017-01-23 ENCOUNTER — Encounter: Payer: Self-pay | Admitting: Cardiology

## 2017-01-26 ENCOUNTER — Telehealth: Payer: Self-pay | Admitting: Cardiology

## 2017-01-26 NOTE — Telephone Encounter (Signed)
New message    Pt is returning Katy's call

## 2017-01-26 NOTE — Progress Notes (Signed)
Cardiology Office Note    Date:  01/27/2017   ID:  John Hinton, DOB 02-10-1946, MRN [id number]  PCP:  [name], MD  Cardiologist:  Dr. [name]  Chief Complaint: shortness of breath and chest discomfort  History of Present Illness:   John Hinton is a 71 y.o. male CAD s/p CABG & DES to SVG -OM1/OM2, OSA on CPAP, HTN, HLD presented for SOB and chest pain.   Last cath 03/2015 showing Severe 3 vessel CAD with total occlusion of the RCA, LAD, and LCx, and severe left main stenosis. There was patency of the SVG-diagonal (fills the LAD), SVG-PDA, and LIMA-LAD with severe stenosis of the SVG-OM1 and OM2 treated successfully with PCI using distal embolic protection and a DES platform.   Last echo 09/2015 showed LVEF of 55-60% (improved ), grade 1 DD,  aortic valve sclerosis, mild LAE.  Here was doing well on cardiac stand point when last seen by Dr. [name] 03/26/16.  Added to my schedule for SOB and chest pain x 5 months. Patient has a progressive worsening of exertional chest tightness with shortness of breath. Relieved with rest. This symptoms is similar to prior angina but last intensity (not radiating to neck). Over the course of 5 months his symptoms is getting more intense and taking longer time to resolve with rest. He has not tried any nitroglycerin. He used to move yeard but now he is able to do so. If he tries, he need to stop frequently. Compliant with medication. Blood pressure has been normal at home in 130s over 80s range. Today 160/80. He denies orthopnea, PND, syncope, palpitation, lower extremity edema or melena.   Past Medical History:  Diagnosis Date  . Arrhythmia    Post-op  . BPH (benign prostatic hypertrophy)   . Bradycardia    a. H/o asymptomatic bradycardia.  . Coronary artery disease    a. s/p CABG ~2007 with LIMA to LAD, SVG seq to OM1 and OM2, SVG to PDA and SVG to diag. b. Abnormal nuc 03/2015 - s/p DES to SVG-OM1-OM2.  Marland Kitchen DCM (dilated cardiomyopathy)  ([place]) 09/17/2015  . Dyslipidemia   . ED (erectile dysfunction)   . Fear of heights   . Hypertension   . Ischemic cardiomyopathy    a. EF 46% by nuc 03/2015.  Marland Kitchen OSA (obstructive sleep apnea) 07/12/2015   Moderate OSA with AHI 16.5/hr now on CPAP at 8cm H2O  . RBBB   . Shoulder, capsulitis, adhesive    Left Shoulder    Past Surgical History:  Procedure Laterality Date  . CARDIAC CATHETERIZATION N/A 04/05/2015   Procedure: Left Heart Cath and Coronary Angiography;  Surgeon: Sherren Mocha, MD;  Location: [facility];  Service: Cardiovascular;  Laterality: N/A;  . CORONARY ARTERY BYPASS GRAFT     w/LIMA to LAD, seq SVG to OM1 and OM2, SVG to PDA and SVG to Diagonal    Current Medications: Prior to Admission medications   Medication Sig Start Date End Date Taking? Authorizing Provider  ALPRAZolam Duanne Moron) 0.5 MG tablet Take 0.5 mg by mouth 2 (two) times daily as needed for anxiety.    [provider]  aspirin 81 MG tablet Take 81 mg by mouth daily.    [provider]  cholecalciferol (VITAMIN D) 1000 UNITS tablet Take 1,000 Units by mouth daily.    [provider]  clopidogrel (PLAVIX) 75 MG tablet TAKE 1 TABLET(75 MG) BY MOUTH DAILY WITH BREAKFAST 04/20/16   Charlie Pitter, PA-C  doxazosin (CARDURA) 2 MG tablet TAKE 1 TABLET(2 MG) BY MOUTH DAILY 08/31/16   Sueanne Margarita, MD  ezetimibe (ZETIA) 10 MG tablet Take 1 tablet (10 mg total) by mouth daily. 03/26/16   Sueanne Margarita, MD  folic acid (FOLVITE) 397 MCG tablet Take 400 mcg by mouth daily.    [provider]  Multiple Vitamin (MULTIVITAMIN) tablet Take 1 tablet by mouth daily.    [provider]  nitroGLYCERIN (NITROSTAT) 0.4 MG SL tablet Place 1 tablet (0.4 mg total) under the tongue every 5 (five) minutes as needed for chest pain. 04/06/15   Barrett, Evelene Croon, PA-C  ramipril (ALTACE) 10 MG capsule Take 2 capsules (20 mg total) by mouth daily. 04/20/16   Sueanne Margarita, MD  rosuvastatin  (CRESTOR) 40 MG tablet Take 1 tablet (40 mg total) by mouth daily. 03/26/16   Sueanne Margarita, MD  sertraline (ZOLOFT) 50 MG tablet Take 50 mg by mouth daily.    [provider]  spironolactone (ALDACTONE) 25 MG tablet TAKE 1 TABLET(25 MG) BY MOUTH DAILY 04/16/16   Charlie Pitter, PA-C    Allergies:   Amlodipine   Social History   Social History  . Marital status: Married    Spouse name: N/A  . Number of children: N/A  . Years of education: N/A   Social History Main Topics  . Smoking status: Former Smoker    Types: Cigarettes    Quit date: 07/21/1983  . Smokeless tobacco: Never Used  . Alcohol use 1.2 oz/week    2 Cans of beer per week     Comment: daily  . Drug use: No  . Sexual activity: Not Asked   Other Topics Concern  . None   Social History Narrative  . None     Family History:  The patient's family history includes Cancer in his sister; Heart disease in his father; Heart failure in his father; Lung cancer in his father and mother.   ROS:   Please see the history of present illness.    ROS All other systems reviewed and are negative.   PHYSICAL EXAM:   VS:  BP (!) 160/80   Pulse (!) 55   Ht 5\' 11"  (1.803 m)   Wt 215 lb 1.9 oz (97.6 kg)   SpO2 97%   BMI 30.00 kg/m    GEN: Well nourished, well developed, in no acute distress  HEENT: normal  Neck: no JVD, carotid bruits, or masses Cardiac: RRR; no murmurs, rubs, or gallops,no edema  Respiratory:  clear to auscultation bilaterally, normal work of breathing GI: soft, nontender, nondistended, + BS MS: no deformity or atrophy  Skin: warm and dry, no rash Neuro:  Alert and Oriented x 3, Strength and sensation are intact Psych: euthymic mood, full affect  Wt Readings from Last 3 Encounters:  01/27/17 215 lb 1.9 oz (97.6 kg)  03/26/16 209 lb 1.9 oz (94.9 kg)  09/17/15 204 lb 6.4 oz (92.7 kg)      Studies/Labs Reviewed:   EKG:  EKG is ordered today.  The ekg ordered today demonstrates SR at rate of 62  bpm. No acute changes.   Recent Labs: 03/26/2016: ALT 19; BUN 20; Creat 1.01; Potassium 4.1; Sodium 140   Lipid Panel    Component Value Date/Time   CHOL 141 03/26/2016 1009   TRIG 70 03/26/2016 1009   HDL 63 03/26/2016 1009   CHOLHDL 2.2 03/26/2016 1009   VLDL 14 03/26/2016 1009  [place] 64 03/26/2016 1009    Additional studies/ records that were reviewed today include:   As above    ASSESSMENT & PLAN:    1. Anginal pain with CAD s/p CABG & DES to SVG -OM1/OM2 - His symptoms progressively worsen over the past 5 months. He did not required any nitroglycerin however his intensity and duration to resolve symptoms has worsened. Unable to add Coreg due to bradycardia and amlodipine due to allergy. Will start Imdur 30 mg. Proceed with cardiac catheterization for definite evaluation of coronary anatomy.  The patient understands that risks include but are not limited to stroke (1 in 1000), death (1 in 1), kidney failure [usually temporary] (1 in 500), bleeding (1 in 200), allergic reaction [possibly serious] (1 in 200), and agrees to proceed.   2. OSA on CPAP  3. HTN - Elevated today. Add Imdur as above. Continue Ramipril 20mg  qd.   4. HLD - 03/26/2016: Cholesterol 141; HDL 63; LDL Cholesterol 64; Triglycerides 70; VLDL 14  - Continue statin   Medication Adjustments/Labs and Tests Ordered: Current medicines are reviewed at length with the patient today.  Concerns regarding medicines are outlined above.  Medication changes, Labs and Tests ordered today are listed in the Patient Instructions below. Patient Instructions  Your physician recommends that you continue on your current medications as directed. Please refer to the Current Medication list given to you today.   Your physician recommends that you schedule a follow-up appointment in:     Jarrett Soho, [place]  01/27/2017 8:18 AM    [address], [place], [place]   [id number] Phone: [phone]; Fax: [phone]

## 2017-01-26 NOTE — Telephone Encounter (Signed)
Patient reports shortness of breath on exertion and noticeable "chest constriction" for about 5 months. He states he is usually fine doing his daily activities, but if he exerts himself for a longer period of time or more quickly than usually he becomes short of breath. He reports he can no longer walk more than half a mile without SOB or slight chest pressure. He states the symptoms usually improve with rest.  He states sometimes at night he has to take [place] because he thinks he has heart burn as well. He is currently asymptomatic.  Scheduled patient for evaluation with B. Bhagat, PA tomorrow. Patient understands to call if symptoms worsen prior to appointment.

## 2017-01-26 NOTE — Telephone Encounter (Signed)
MyChart message received:   Hi Dr. [name],  For the past few months I am finding it more difficult to breath. My Normal walks are being cut short as I am becoming tired and my chest is slowly constricting, and I feel pressure. After a brief rest, I am fine for a while. Quick exertion triggers shortness of breath. My question is, should I be concerned or is this the effects of getting old.    John Hinton  Jan 22, 1946    Returned patient's call. Left message to call back.

## 2017-01-26 NOTE — Telephone Encounter (Signed)
Left message to call back to discuss symptoms and to schedule follow-up.

## 2017-01-27 ENCOUNTER — Ambulatory Visit (INDEPENDENT_AMBULATORY_CARE_PROVIDER_SITE_OTHER): Payer: Medicare Other | Admitting: Physician Assistant

## 2017-01-27 ENCOUNTER — Encounter: Payer: Self-pay | Admitting: *Deleted

## 2017-01-27 ENCOUNTER — Other Ambulatory Visit: Payer: Self-pay | Admitting: Physician Assistant

## 2017-01-27 ENCOUNTER — Encounter: Payer: Self-pay | Admitting: Physician Assistant

## 2017-01-27 VITALS — BP 160/80 | HR 55 | Ht 71.0 in | Wt 215.1 lb

## 2017-01-27 DIAGNOSIS — I209 Angina pectoris, unspecified: Secondary | ICD-10-CM

## 2017-01-27 DIAGNOSIS — Z01812 Encounter for preprocedural laboratory examination: Secondary | ICD-10-CM | POA: Diagnosis not present

## 2017-01-27 DIAGNOSIS — I2510 Atherosclerotic heart disease of native coronary artery without angina pectoris: Secondary | ICD-10-CM | POA: Diagnosis not present

## 2017-01-27 DIAGNOSIS — I10 Essential (primary) hypertension: Secondary | ICD-10-CM

## 2017-01-27 DIAGNOSIS — G4733 Obstructive sleep apnea (adult) (pediatric): Secondary | ICD-10-CM | POA: Diagnosis not present

## 2017-01-27 DIAGNOSIS — E785 Hyperlipidemia, unspecified: Secondary | ICD-10-CM

## 2017-01-27 DIAGNOSIS — I25708 Atherosclerosis of coronary artery bypass graft(s), unspecified, with other forms of angina pectoris: Secondary | ICD-10-CM | POA: Diagnosis not present

## 2017-01-27 DIAGNOSIS — Z5181 Encounter for therapeutic drug level monitoring: Secondary | ICD-10-CM | POA: Diagnosis not present

## 2017-01-27 LAB — CBC WITH DIFFERENTIAL/PLATELET
Basophils Absolute: 0 x10E3/uL (ref 0.0–0.2)
Basos: 0 %
EOS (ABSOLUTE): 0.1 x10E3/uL (ref 0.0–0.4)
Eos: 1 %
Hematocrit: 39.2 % (ref 37.5–51.0)
Hemoglobin: 13.7 g/dL (ref 13.0–17.7)
Immature Grans (Abs): 0 x10E3/uL (ref 0.0–0.1)
Immature Granulocytes: 0 %
Lymphocytes Absolute: 1 x10E3/uL (ref 0.7–3.1)
Lymphs: 18 %
MCH: 34.2 pg — ABNORMAL HIGH (ref 26.6–33.0)
MCHC: 34.9 g/dL (ref 31.5–35.7)
MCV: 98 fL — ABNORMAL HIGH (ref 79–97)
Monocytes Absolute: 0.5 x10E3/uL (ref 0.1–0.9)
Monocytes: 10 %
Neutrophils Absolute: 3.8 x10E3/uL (ref 1.4–7.0)
Neutrophils: 71 %
Platelets: 165 x10E3/uL (ref 150–379)
RBC: 4.01 x10E6/uL — ABNORMAL LOW (ref 4.14–5.80)
RDW: 12.9 % (ref 12.3–15.4)
WBC: 5.4 x10E3/uL (ref 3.4–10.8)

## 2017-01-27 LAB — PROTIME-INR
INR: 1 (ref 0.8–1.2)
Prothrombin Time: 10.3 s (ref 9.1–12.0)

## 2017-01-27 LAB — BASIC METABOLIC PANEL WITH GFR
BUN/Creatinine Ratio: 20 (ref 10–24)
BUN: 18 mg/dL (ref 8–27)
CO2: 24 mmol/L (ref 20–29)
Calcium: 9.4 mg/dL (ref 8.6–10.2)
Chloride: 100 mmol/L (ref 96–106)
Creatinine, Ser: 0.92 mg/dL (ref 0.76–1.27)
GFR calc Af Amer: 98 mL/min/1.73
GFR calc non Af Amer: 85 mL/min/1.73
Glucose: 102 mg/dL — ABNORMAL HIGH (ref 65–99)
Potassium: 4.3 mmol/L (ref 3.5–5.2)
Sodium: 140 mmol/L (ref 134–144)

## 2017-01-27 MED ORDER — ISOSORBIDE MONONITRATE ER 30 MG PO TB24: 30.0000 mg | ORAL_TABLET | Freq: Every day | ORAL | 3 refills | Status: DC

## 2017-01-27 MED ORDER — NITROGLYCERIN 0.4 MG SL SUBL: 0.4000 mg | SUBLINGUAL_TABLET | SUBLINGUAL | 3 refills | Status: DC | PRN

## 2017-01-27 NOTE — Patient Instructions (Addendum)
Your physician has recommended you make the following change in your medication:  START ISOSORBIDE  [address] [place]  Your physician recommends that you return for lab work in:  [place] AND INR   Your physician recommends that you schedule a follow-up appointment in: 7- [facility] has requested that you have a cardiac catheterization. Cardiac catheterization is used to diagnose and/or treat various heart conditions. Doctors may recommend this procedure for a number of different reasons. The most common reason is to evaluate chest pain. Chest pain can be a symptom of coronary artery disease (CAD), and cardiac catheterization can show whether plaque is narrowing or blocking your heart's arteries. This procedure is also used to evaluate the valves, as well as measure the blood flow and oxygen levels in different parts of your heart. For further information please visit [email or web address]. Please follow instruction sheet, as given. 01-29-17 WITH DR  Martinique

## 2017-01-28 ENCOUNTER — Telehealth: Payer: Self-pay

## 2017-01-28 NOTE — Telephone Encounter (Signed)
Detailed message left per DPR.  Patient contacted via VM pre-catheterization at [facility] scheduled for:  01/29/2017 @ 1030 Verified arrival time and place:  Left message requesting Pt arrive @ 0800 for cath Confirmed AM meds to be taken pre-cath with sip of water:  Notified Pt to take Plavix and ASA prior to arrival.  Notified Pt to hold spironalactone Confirmed patient has responsible person to drive home post procedure and observe patient for 24 hours:  Notified Addl concerns:  Left this nurse name and # if any further questions.

## 2017-01-29 ENCOUNTER — Ambulatory Visit (HOSPITAL_COMMUNITY)
Admission: RE | Admit: 2017-01-29 | Discharge: 2017-01-29 | Disposition: A | Discharge status: Home / Self Care | Payer: Medicare Other | Source: Ambulatory Visit | Attending: Cardiology | Admitting: Cardiology

## 2017-01-29 ENCOUNTER — Encounter (HOSPITAL_COMMUNITY)
Admission: RE | Disposition: A | Discharge status: Home / Self Care | Payer: Self-pay | Source: Ambulatory Visit | Attending: Cardiology

## 2017-01-29 ENCOUNTER — Encounter (HOSPITAL_COMMUNITY): Payer: Self-pay | Admitting: Cardiology

## 2017-01-29 DIAGNOSIS — T82855A Stenosis of coronary artery stent, initial encounter: Secondary | ICD-10-CM | POA: Insufficient documentation

## 2017-01-29 DIAGNOSIS — I2582 Chronic total occlusion of coronary artery: Secondary | ICD-10-CM | POA: Insufficient documentation

## 2017-01-29 DIAGNOSIS — E785 Hyperlipidemia, unspecified: Secondary | ICD-10-CM | POA: Insufficient documentation

## 2017-01-29 DIAGNOSIS — Y831 Surgical operation with implant of artificial internal device as the cause of abnormal reaction of the patient, or of later complication, without mention of misadventure at the time of the procedure: Secondary | ICD-10-CM | POA: Diagnosis not present

## 2017-01-29 DIAGNOSIS — I209 Angina pectoris, unspecified: Secondary | ICD-10-CM | POA: Diagnosis present

## 2017-01-29 DIAGNOSIS — I25110 Atherosclerotic heart disease of native coronary artery with unstable angina pectoris: Secondary | ICD-10-CM | POA: Insufficient documentation

## 2017-01-29 DIAGNOSIS — I10 Essential (primary) hypertension: Secondary | ICD-10-CM | POA: Insufficient documentation

## 2017-01-29 DIAGNOSIS — I25719 Atherosclerosis of autologous vein coronary artery bypass graft(s) with unspecified angina pectoris: Secondary | ICD-10-CM | POA: Diagnosis not present

## 2017-01-29 DIAGNOSIS — G4733 Obstructive sleep apnea (adult) (pediatric): Secondary | ICD-10-CM | POA: Insufficient documentation

## 2017-01-29 DIAGNOSIS — I4510 Unspecified right bundle-branch block: Secondary | ICD-10-CM | POA: Diagnosis present

## 2017-01-29 DIAGNOSIS — I200 Unstable angina: Secondary | ICD-10-CM | POA: Diagnosis present

## 2017-01-29 DIAGNOSIS — I2584 Coronary atherosclerosis due to calcified coronary lesion: Secondary | ICD-10-CM | POA: Diagnosis not present

## 2017-01-29 DIAGNOSIS — Z9861 Coronary angioplasty status: Secondary | ICD-10-CM

## 2017-01-29 DIAGNOSIS — I2510 Atherosclerotic heart disease of native coronary artery without angina pectoris: Secondary | ICD-10-CM | POA: Diagnosis present

## 2017-01-29 HISTORY — PX: LEFT HEART CATH AND CORS/GRAFTS ANGIOGRAPHY: CATH118250

## 2017-01-29 HISTORY — PX: CORONARY BALLOON ANGIOPLASTY: CATH118233

## 2017-01-29 LAB — POCT ACTIVATED CLOTTING TIME: Activated Clotting Time: 285 seconds

## 2017-01-29 SURGERY — LEFT HEART CATH AND CORS/GRAFTS ANGIOGRAPHY
Anesthesia: LOCAL

## 2017-01-29 MED ORDER — CLOPIDOGREL BISULFATE 75 MG PO TABS: 75.0000 mg | ORAL_TABLET | Freq: Every day | ORAL | Status: DC

## 2017-01-29 MED ORDER — SERTRALINE HCL 50 MG PO TABS: 50.0000 mg | ORAL_TABLET | Freq: Every day | ORAL | Status: DC

## 2017-01-29 MED ORDER — HEPARIN (PORCINE) IN NACL 2-0.9 UNIT/ML-% IJ SOLN
INTRAMUSCULAR | Status: AC
  Filled 2017-01-29: qty 1000

## 2017-01-29 MED ORDER — IOPAMIDOL (ISOVUE-370) INJECTION 76%
INTRAVENOUS | Status: DC | PRN
  Administered 2017-01-29: 140 mL via INTRA_ARTERIAL

## 2017-01-29 MED ORDER — VERAPAMIL HCL 2.5 MG/ML IV SOLN
INTRAVENOUS | Status: DC | PRN
  Administered 2017-01-29: 10 mL via INTRA_ARTERIAL

## 2017-01-29 MED ORDER — RAMIPRIL 10 MG PO CAPS: 20.0000 mg | ORAL_CAPSULE | Freq: Every day | ORAL | Status: DC

## 2017-01-29 MED ORDER — ANGIOPLASTY BOOK
Freq: Once | Status: DC
  Filled 2017-01-29: qty 1

## 2017-01-29 MED ORDER — SPIRONOLACTONE 25 MG PO TABS: 25.0000 mg | ORAL_TABLET | Freq: Every day | ORAL | Status: DC

## 2017-01-29 MED ORDER — ONDANSETRON HCL 4 MG/2ML IJ SOLN: 4.0000 mg | Freq: Four times a day (QID) | INTRAMUSCULAR | Status: DC | PRN

## 2017-01-29 MED ORDER — ISOSORBIDE MONONITRATE ER 30 MG PO TB24: 30.0000 mg | ORAL_TABLET | Freq: Every day | ORAL | Status: DC

## 2017-01-29 MED ORDER — SODIUM CHLORIDE 0.9 % IV SOLN: 250.0000 mL | INTRAVENOUS | Status: DC | PRN

## 2017-01-29 MED ORDER — LIDOCAINE HCL (PF) 1 % IJ SOLN
INTRAMUSCULAR | Status: DC | PRN
  Administered 2017-01-29: 2 mL via SUBCUTANEOUS

## 2017-01-29 MED ORDER — SODIUM CHLORIDE 0.9% FLUSH: 3.0000 mL | Freq: Two times a day (BID) | INTRAVENOUS | Status: DC

## 2017-01-29 MED ORDER — MIDAZOLAM HCL 2 MG/2ML IJ SOLN
INTRAMUSCULAR | Status: AC
  Filled 2017-01-29: qty 2

## 2017-01-29 MED ORDER — MIDAZOLAM HCL 2 MG/2ML IJ SOLN
INTRAMUSCULAR | Status: DC | PRN
  Administered 2017-01-29 (×2): 1 mg via INTRAVENOUS

## 2017-01-29 MED ORDER — FENTANYL CITRATE (PF) 100 MCG/2ML IJ SOLN
INTRAMUSCULAR | Status: DC | PRN
  Administered 2017-01-29 (×2): 25 ug via INTRAVENOUS

## 2017-01-29 MED ORDER — HEPARIN SODIUM (PORCINE) 1000 UNIT/ML IJ SOLN
INTRAMUSCULAR | Status: DC | PRN
  Administered 2017-01-29: 4000 [IU] via INTRAVENOUS
  Administered 2017-01-29: 5000 [IU] via INTRAVENOUS

## 2017-01-29 MED ORDER — SODIUM CHLORIDE 0.9 % WEIGHT BASED INFUSION
3.0000 mL/kg/h | INTRAVENOUS | Status: DC
  Administered 2017-01-29: 3 mL/kg/h via INTRAVENOUS

## 2017-01-29 MED ORDER — ACETAMINOPHEN 325 MG PO TABS: 650.0000 mg | ORAL_TABLET | ORAL | Status: DC | PRN

## 2017-01-29 MED ORDER — EZETIMIBE 10 MG PO TABS: 10.0000 mg | ORAL_TABLET | Freq: Every day | ORAL | Status: DC

## 2017-01-29 MED ORDER — ONE-DAILY MULTI VITAMINS PO TABS: 1.0000 | ORAL_TABLET | Freq: Every day | ORAL | Status: DC

## 2017-01-29 MED ORDER — FENTANYL CITRATE (PF) 100 MCG/2ML IJ SOLN
INTRAMUSCULAR | Status: AC
  Filled 2017-01-29: qty 2

## 2017-01-29 MED ORDER — VITAMIN D3 25 MCG (1000 UNIT) PO TABS: 1000.0000 [IU] | ORAL_TABLET | Freq: Every day | ORAL | Status: DC

## 2017-01-29 MED ORDER — LIDOCAINE HCL 1 % IJ SOLN
INTRAMUSCULAR | Status: AC
  Filled 2017-01-29: qty 20

## 2017-01-29 MED ORDER — SODIUM CHLORIDE 0.9% FLUSH: 3.0000 mL | INTRAVENOUS | Status: DC | PRN

## 2017-01-29 MED ORDER — SODIUM CHLORIDE 0.9 % WEIGHT BASED INFUSION: 1.0000 mL/kg/h | INTRAVENOUS | Status: DC

## 2017-01-29 MED ORDER — ASPIRIN 81 MG PO TABS: 81.0000 mg | ORAL_TABLET | Freq: Every day | ORAL | Status: DC

## 2017-01-29 MED ORDER — NITROGLYCERIN 0.4 MG SL SUBL: 0.4000 mg | SUBLINGUAL_TABLET | SUBLINGUAL | Status: DC | PRN

## 2017-01-29 MED ORDER — IOPAMIDOL (ISOVUE-370) INJECTION 76%
INTRAVENOUS | Status: AC
  Filled 2017-01-29: qty 125

## 2017-01-29 MED ORDER — ASPIRIN 81 MG PO CHEW: 81.0000 mg | CHEWABLE_TABLET | ORAL | Status: DC

## 2017-01-29 MED ORDER — HEPARIN (PORCINE) IN NACL 2-0.9 UNIT/ML-% IJ SOLN
INTRAMUSCULAR | Status: AC | PRN
Start: 2017-01-29 — End: 2017-01-29
  Administered 2017-01-29: 1000 mL

## 2017-01-29 MED ORDER — ROSUVASTATIN CALCIUM 40 MG PO TABS: 40.0000 mg | ORAL_TABLET | Freq: Every day | ORAL | Status: DC

## 2017-01-29 MED ORDER — IOPAMIDOL (ISOVUE-370) INJECTION 76%
INTRAVENOUS | Status: AC
  Filled 2017-01-29: qty 50

## 2017-01-29 MED ORDER — ALPRAZOLAM 0.5 MG PO TABS: 0.5000 mg | ORAL_TABLET | Freq: Two times a day (BID) | ORAL | Status: DC | PRN

## 2017-01-29 MED ORDER — DOXAZOSIN MESYLATE 2 MG PO TABS: 2.0000 mg | ORAL_TABLET | Freq: Every day | ORAL | Status: DC

## 2017-01-29 MED ORDER — HEPARIN SODIUM (PORCINE) 1000 UNIT/ML IJ SOLN
INTRAMUSCULAR | Status: AC
  Filled 2017-01-29: qty 1

## 2017-01-29 MED ORDER — VERAPAMIL HCL 2.5 MG/ML IV SOLN
INTRAVENOUS | Status: AC
  Filled 2017-01-29: qty 2

## 2017-01-29 MED ORDER — FOLIC ACID 400 MCG PO TABS: 400.0000 ug | ORAL_TABLET | Freq: Every day | ORAL | Status: DC

## 2017-01-29 SURGICAL SUPPLY — 24 items
BALLN SAPPHIRE 2.0X12 (BALLOONS) ×2
BALLN WOLVERINE 3.00X10 (BALLOONS)
BALLN WOLVERINE 3.50X10 (BALLOONS) ×2
BALLOON SAPPHIRE 2.0X12 (BALLOONS) IMPLANT
BALLOON WOLVERINE 3.00X10 (BALLOONS) IMPLANT
BALLOON WOLVERINE 3.50X10 (BALLOONS) IMPLANT
CATH INFINITI 5FR AL1 (CATHETERS) ×1 IMPLANT
CATH INFINITI 5FR MPB2 (CATHETERS) ×1 IMPLANT
CATH INFINITI 5FR MULTPACK ANG (CATHETERS) ×1 IMPLANT
CATH LAUNCHER 6FR AL1 (CATHETERS) IMPLANT
CATHETER LAUNCHER 6FR AL1 (CATHETERS) ×2
COVER PRB 48X5XTLSCP FOLD TPE (BAG) IMPLANT
COVER PROBE 5X48 (BAG) ×2
DEVICE RAD COMP TR BAND LRG (VASCULAR PRODUCTS) ×1 IMPLANT
GLIDESHEATH SLEND SS 6F .021 (SHEATH) ×1 IMPLANT
GUIDEWIRE INQWIRE 1.5J.035X260 (WIRE) IMPLANT
INQWIRE 1.5J .035X260CM (WIRE) ×2
KIT ENCORE 26 ADVANTAGE (KITS) ×1 IMPLANT
KIT HEART LEFT (KITS) ×2 IMPLANT
PACK CARDIAC CATHETERIZATION (CUSTOM PROCEDURE TRAY) ×2 IMPLANT
SYR MEDRAD MARK V 150ML (SYRINGE) ×2 IMPLANT
TRANSDUCER W/STOPCOCK (MISCELLANEOUS) ×2 IMPLANT
TUBING CIL FLEX 10 FLL-RA (TUBING) ×2 IMPLANT
WIRE ASAHI PROWATER 180CM (WIRE) ×1 IMPLANT

## 2017-01-29 NOTE — Discharge Summary (Signed)
Discharge Summary    Patient ID: John Hinton,  MRN: [id number], DOB/AGE: Nov 29, 1945 72 y.o.  Admit date: 01/29/2017 Discharge date: 01/29/2017  Primary Care Provider: [name] Primary Cardiologist: [name]  Discharge Diagnoses    Principal Problem:   Angina pectoris [facility]) Active Problems:   Essential hypertension, benign   Hyperlipemia   CAD (coronary artery disease), native coronary artery   RBBB   Allergies Allergies  Allergen Reactions  . Amlodipine Swelling    Edema    Diagnostic Studies/Procedures    LHC: 01/29/17  Conclusion     Prox RCA lesion, 90 %stenosed.  Mid RCA lesion, 100 %stenosed.  LIMA.  LM lesion, 80 %stenosed.  Prox Cx lesion, 100 %stenosed.  Prox LAD lesion, 100 %stenosed.  The left ventricular systolic function is normal.  LV end diastolic pressure is normal.  The left ventricular ejection fraction is 55-65% by visual estimate.  Dist Graft lesion, 90 %stenosed.  Post intervention, there is a 10% residual stenosis.   1. Severe 3 vessel occlusive CAD 2. Patent LIMA to the LAD 3. Patent SVG to the first diagonal 4. Patent SVG to PDA 5. SVG to OM with severe focal instent restenosis  6. Normal LV function 7. Normal LVEDP 8. Successful Cutting balloon angioplasty of the SVG to OM for in stent restenosis.   Plan: Continue DAPT with ASA and Plavix. Patient is a candidate for same day discharge.    _____________   History of Present Illness     71 y.o. male CAD s/p CABG & DES to SVG -OM1/OM2, OSA on CPAP, HTN, HLD presented for SOB and chest pain.   Last cath 03/2015 showingSevere 3 vessel CAD with total occlusion of the RCA, LAD, and LCx, and severe left main stenosis. There was patency of the SVG-diagonal (fills the LAD), SVG-PDA, and LIMA-LAD with severe stenosis of the SVG-OM1 and OM2 treated successfully with PCI using distal embolic protection and a DES platform.   Last echo 09/2015 showed LVEF of 55-60%  (improved ), grade 1 DD, aortic valve sclerosis, mild LAE.  Here was doing well on cardiac stand point when last seen by Dr. [name] 03/26/16.  He was seen in the office by Robbie Lis on 01/27/17 for SOB and chest pain x 5 months. Patient reported progressive worsening of exertional chest tightness with shortness of breath. Relieved with rest. This symptoms were similar to prior angina but last intensity (not radiating to neck). Over the course of 5 months his symptoms were getting more intense and taking longer time to resolve with rest. He had not tried any nitroglycerin. He used to mow the yard but now he is able to do so. If he tries, he need to stop frequently. Compliant with medication. Blood pressure has been normal at home in 130s over 80s range. He denies orthopnea, PND, syncope, palpitation, lower extremity edema or melena. Given his progressive symptoms he was referred for outpatient cath. He was started on Imdur 30mg  daily after this office visit.   Hospital Course     Underwent LHC with Dr. Martinique noted above with cutting balloon angioplasty of the SVG to OM for in stent restenosis. Normal LVEF and LVEDP. Plan to continue DAPT with ASA/Plavix. No post cath complications noted. Seen by cardiac rehab prior to discharge. Radial cath site stable. Post cath restrictions reviewed.   Follow up in the office has been arranged. Medications are listed below.  _____________  Discharge Vitals Blood pressure 132/77, pulse (!) 52,  temperature 97.6 F (36.4 C), resp. rate 16, height 5\' 11"  (1.803 m), weight 215 lb (97.5 kg), SpO2 97 %.  Filed Weights   01/29/17 0823  Weight: 215 lb (97.5 kg)    Labs & Radiologic Studies    CBC  Recent Labs  01/27/17 0834  WBC 5.4  NEUTROABS 3.8  HGB 13.7  HCT 39.2  MCV 98*  PLT 947   Basic Metabolic Panel  Recent Labs  01/27/17 0834  NA 140  K 4.3  CL 100  CO2 24  GLUCOSE 102*  BUN 18  CREATININE 0.92  CALCIUM 9.4   Liver Function  Tests No results for input(s): AST, ALT, ALKPHOS, BILITOT, PROT, ALBUMIN in the last 72 hours. No results for input(s): LIPASE, AMYLASE in the last 72 hours. Cardiac Enzymes No results for input(s): CKTOTAL, CKMB, CKMBINDEX, TROPONINI in the last 72 hours. BNP Invalid input(s): POCBNP D-Dimer No results for input(s): DDIMER in the last 72 hours. Hemoglobin A1C No results for input(s): HGBA1C in the last 72 hours. Fasting Lipid Panel No results for input(s): CHOL, HDL, LDLCALC, TRIG, CHOLHDL, LDLDIRECT in the last 72 hours. Thyroid Function Tests No results for input(s): TSH, T4TOTAL, T3FREE, THYROIDAB in the last 72 hours.  Invalid input(s): FREET3 _____________  No results found. Disposition   Pt is being discharged home today in good condition.  Follow-up Plans & Appointments    Follow-up Information    Imogene Burn, PA-C Follow up on 02/08/2017.   Specialty:  Cardiology Why:  at 11:15am for your follow up appt.  Contact information: [address] [place] [id number] [phone]          Discharge Instructions    Amb Referral to Cardiac Rehabilitation    Complete by:  As directed    Diagnosis:  PTCA Comment - cutting balloon   Diet - low sodium heart healthy    Complete by:  As directed    Increase activity slowly    Complete by:  As directed       Discharge Medications   Current Discharge Medication List    CONTINUE these medications which have NOT CHANGED   Details  aspirin 81 MG tablet Take 81 mg by mouth at bedtime.     cholecalciferol (VITAMIN D) 1000 UNITS tablet Take 1,000 Units by mouth at bedtime.     clopidogrel (PLAVIX) 75 MG tablet TAKE 1 TABLET(75 MG) BY MOUTH DAILY WITH BREAKFAST Qty: 90 tablet, Refills: 3    doxazosin (CARDURA) 2 MG tablet TAKE 1 TABLET(2 MG) BY MOUTH DAILY Qty: 90 tablet, Refills: 1    ezetimibe (ZETIA) 10 MG tablet Take 1 tablet (10 mg total) by mouth daily. Qty: 90 tablet, Refills: 3     folic acid (FOLVITE) 650 MCG tablet Take 400 mcg by mouth daily.    isosorbide mononitrate (IMDUR) 30 MG 24 hr tablet Take 1 tablet (30 mg total) by mouth daily. Qty: 90 tablet, Refills: 3    Multiple Vitamin (MULTIVITAMIN) tablet Take 1 tablet by mouth daily.    nitroGLYCERIN (NITROSTAT) 0.4 MG SL tablet Place 1 tablet (0.4 mg total) under the tongue every 5 (five) minutes as needed for chest pain. Qty: 25 tablet, Refills: 3    ramipril (ALTACE) 10 MG capsule Take 2 capsules (20 mg total) by mouth daily. Qty: 180 capsule, Refills: 3    rosuvastatin (CRESTOR) 40 MG tablet Take 1 tablet (40 mg total) by mouth daily. Qty: 90 tablet, Refills: 3  sertraline (ZOLOFT) 50 MG tablet Take 50 mg by mouth daily.    spironolactone (ALDACTONE) 25 MG tablet TAKE 1 TABLET(25 MG) BY MOUTH DAILY Qty: 90 tablet, Refills: 3    ALPRAZolam (XANAX) 0.5 MG tablet Take 0.5 mg by mouth 2 (two) times daily as needed for anxiety.           Outstanding Labs/Studies   None   Duration of Discharge Encounter   Greater than 30 minutes including physician time.  Signed, Lyda Jester, PA-C 01/29/2017, 5:44 PM

## 2017-01-29 NOTE — Interval H&P Note (Signed)
History and Physical Interval Note:  01/29/2017 10:26 AM  [name]  has presented today for surgery, with the diagnosis of angina  The various methods of treatment have been discussed with the patient and family. After consideration of risks, benefits and other options for treatment, the patient has consented to  Procedure(s): Left Heart Cath and Cors/Grafts Angiography (N/A) as a surgical intervention .  The patient's history has been reviewed, patient examined, no change in status, stable for surgery.  I have reviewed the patient's chart and labs.  Questions were answered to the patient's satisfaction.   Cath Lab Visit (complete for each Cath Lab visit)  Clinical Evaluation Leading to the Procedure:   ACS: No.  Non-ACS:    Anginal Classification: CCS III  Anti-ischemic medical therapy: Maximal Therapy (2 or more classes of medications)  Non-Invasive Test Results: No non-invasive testing performed  Prior CABG: Previous CABG        Taelor Waymire Martinique MD,FACC 01/29/2017 10:26 AM

## 2017-01-29 NOTE — Progress Notes (Signed)
[id number] Discussed NTG use, ex ed, plavix, heart healthy food choices, restrictions and CRP 2. Pt voiced understanding. I saw pt in 2016. Discussed CRP 2 which pt has attended before. Pt agreed to referral to [facility] program and will consider. Graylon Good RN BSN 01/29/2017 2:18 PM

## 2017-01-29 NOTE — Discharge Instructions (Signed)

## 2017-01-29 NOTE — H&P (View-Only) (Signed)
Cardiology Office Note    Date:  01/27/2017   ID:  John Hinton, DOB [birth year], MRN [id number]  PCP:  [name], MD  Cardiologist:  Dr. [name]  Chief Complaint: shortness of breath and chest discomfort  History of Present Illness:   John Hinton is a 71 y.o. male CAD s/p CABG & DES to SVG -OM1/OM2, OSA on CPAP, HTN, HLD presented for SOB and chest pain.   Last cath 03/2015 showing Severe 3 vessel CAD with total occlusion of the RCA, LAD, and LCx, and severe left main stenosis. There was patency of the SVG-diagonal (fills the LAD), SVG-PDA, and LIMA-LAD with severe stenosis of the SVG-OM1 and OM2 treated successfully with PCI using distal embolic protection and a DES platform.   Last echo 09/2015 showed LVEF of 55-60% (improved ), grade 1 DD,  aortic valve sclerosis, mild LAE.  Here was doing well on cardiac stand point when last seen by Dr. [name] 03/26/16.  Added to my schedule for SOB and chest pain x 5 months. Patient has a progressive worsening of exertional chest tightness with shortness of breath. Relieved with rest. This symptoms is similar to prior angina but last intensity (not radiating to neck). Over the course of 5 months his symptoms is getting more intense and taking longer time to resolve with rest. He has not tried any nitroglycerin. He used to move yeard but now he is able to do so. If he tries, he need to stop frequently. Compliant with medication. Blood pressure has been normal at home in 130s over 80s range. Today 160/80. He denies orthopnea, PND, syncope, palpitation, lower extremity edema or melena.   Past Medical History:  Diagnosis Date  . Arrhythmia    Post-op  . BPH (benign prostatic hypertrophy)   . Bradycardia    a. H/o asymptomatic bradycardia.  . Coronary artery disease    a. s/p CABG ~2007 with LIMA to LAD, SVG seq to OM1 and OM2, SVG to PDA and SVG to diag. b. Abnormal nuc 03/2015 - s/p DES to SVG-OM1-OM2.  Marland Kitchen DCM (dilated cardiomyopathy)  ([place]) 09/17/2015  . Dyslipidemia   . ED (erectile dysfunction)   . Fear of heights   . Hypertension   . Ischemic cardiomyopathy    a. EF 46% by nuc 03/2015.  Marland Kitchen OSA (obstructive sleep apnea) 07/12/2015   Moderate OSA with AHI 16.5/hr now on CPAP at 8cm H2O  . RBBB   . Shoulder, capsulitis, adhesive    Left Shoulder    Past Surgical History:  Procedure Laterality Date  . CARDIAC CATHETERIZATION N/A 04/05/2015   Procedure: Left Heart Cath and Coronary Angiography;  Surgeon: Sherren Mocha, MD;  Location: [facility];  Service: Cardiovascular;  Laterality: N/A;  . CORONARY ARTERY BYPASS GRAFT     w/LIMA to LAD, seq SVG to OM1 and OM2, SVG to PDA and SVG to Diagonal    Current Medications: Prior to Admission medications   Medication Sig Start Date End Date Taking? Authorizing Provider  ALPRAZolam Duanne Moron) 0.5 MG tablet Take 0.5 mg by mouth 2 (two) times daily as needed for anxiety.    [provider]  aspirin 81 MG tablet Take 81 mg by mouth daily.    [provider]  cholecalciferol (VITAMIN D) 1000 UNITS tablet Take 1,000 Units by mouth daily.    [provider]  clopidogrel (PLAVIX) 75 MG tablet TAKE 1 TABLET(75 MG) BY MOUTH DAILY WITH BREAKFAST 04/20/16   Charlie Pitter, PA-C  doxazosin (CARDURA) 2 MG tablet TAKE 1 TABLET(2 MG) BY MOUTH DAILY 08/31/16   Sueanne Margarita, MD  ezetimibe (ZETIA) 10 MG tablet Take 1 tablet (10 mg total) by mouth daily. 03/26/16   Sueanne Margarita, MD  folic acid (FOLVITE) 240 MCG tablet Take 400 mcg by mouth daily.    [provider]  Multiple Vitamin (MULTIVITAMIN) tablet Take 1 tablet by mouth daily.    [provider]  nitroGLYCERIN (NITROSTAT) 0.4 MG SL tablet Place 1 tablet (0.4 mg total) under the tongue every 5 (five) minutes as needed for chest pain. 04/06/15   Barrett, Evelene Croon, PA-C  ramipril (ALTACE) 10 MG capsule Take 2 capsules (20 mg total) by mouth daily. 04/20/16   Sueanne Margarita, MD  rosuvastatin  (CRESTOR) 40 MG tablet Take 1 tablet (40 mg total) by mouth daily. 03/26/16   Sueanne Margarita, MD  sertraline (ZOLOFT) 50 MG tablet Take 50 mg by mouth daily.    [provider]  spironolactone (ALDACTONE) 25 MG tablet TAKE 1 TABLET(25 MG) BY MOUTH DAILY 04/16/16   Charlie Pitter, PA-C    Allergies:   Amlodipine   Social History   Social History  . Marital status: Married    Spouse name: N/A  . Number of children: N/A  . Years of education: N/A   Social History Main Topics  . Smoking status: Former Smoker    Types: Cigarettes    Quit date: 07/21/1983  . Smokeless tobacco: Never Used  . Alcohol use 1.2 oz/week    2 Cans of beer per week     Comment: daily  . Drug use: No  . Sexual activity: Not Asked   Other Topics Concern  . None   Social History Narrative  . None     Family History:  The patient's family history includes Cancer in his sister; Heart disease in his father; Heart failure in his father; Lung cancer in his father and mother.   ROS:   Please see the history of present illness.    ROS All other systems reviewed and are negative.   PHYSICAL EXAM:   VS:  BP (!) 160/80   Pulse (!) 55   Ht 5\' 11"  (1.803 m)   Wt 215 lb 1.9 oz (97.6 kg)   SpO2 97%   BMI 30.00 kg/m    GEN: Well nourished, well developed, in no acute distress  HEENT: normal  Neck: no JVD, carotid bruits, or masses Cardiac: RRR; no murmurs, rubs, or gallops,no edema  Respiratory:  clear to auscultation bilaterally, normal work of breathing GI: soft, nontender, nondistended, + BS MS: no deformity or atrophy  Skin: warm and dry, no rash Neuro:  Alert and Oriented x 3, Strength and sensation are intact Psych: euthymic mood, full affect  Wt Readings from Last 3 Encounters:  01/27/17 215 lb 1.9 oz (97.6 kg)  03/26/16 209 lb 1.9 oz (94.9 kg)  09/17/15 204 lb 6.4 oz (92.7 kg)      Studies/Labs Reviewed:   EKG:  EKG is ordered today.  The ekg ordered today demonstrates SR at rate of 62  bpm. No acute changes.   Recent Labs: 03/26/2016: ALT 19; BUN 20; Creat 1.01; Potassium 4.1; Sodium 140   Lipid Panel    Component Value Date/Time   CHOL 141 03/26/2016 1009   TRIG 70 03/26/2016 1009   HDL 63 03/26/2016 1009   CHOLHDL 2.2 03/26/2016 1009   VLDL 14 03/26/2016 1009  [place] 64 03/26/2016 1009    Additional studies/ records that were reviewed today include:   As above    ASSESSMENT & PLAN:    1. Anginal pain with CAD s/p CABG & DES to SVG -OM1/OM2 - His symptoms progressively worsen over the past 5 months. He did not required any nitroglycerin however his intensity and duration to resolve symptoms has worsened. Unable to add Coreg due to bradycardia and amlodipine due to allergy. Will start Imdur 30 mg. Proceed with cardiac catheterization for definite evaluation of coronary anatomy.  The patient understands that risks include but are not limited to stroke (1 in 1000), death (1 in 28), kidney failure [usually temporary] (1 in 500), bleeding (1 in 200), allergic reaction [possibly serious] (1 in 200), and agrees to proceed.   2. OSA on CPAP  3. HTN - Elevated today. Add Imdur as above. Continue Ramipril 20mg  qd.   4. HLD - 03/26/2016: Cholesterol 141; HDL 63; LDL Cholesterol 64; Triglycerides 70; VLDL 14  - Continue statin   Medication Adjustments/Labs and Tests Ordered: Current medicines are reviewed at length with the patient today.  Concerns regarding medicines are outlined above.  Medication changes, Labs and Tests ordered today are listed in the Patient Instructions below. Patient Instructions  Your physician recommends that you continue on your current medications as directed. Please refer to the Current Medication list given to you today.   Your physician recommends that you schedule a follow-up appointment in:     Jarrett Soho, [place]  01/27/2017 8:18 AM    [address], [place], [place]   [address] Phone: [phone]; Fax: [phone]

## 2017-02-01 ENCOUNTER — Encounter (HOSPITAL_COMMUNITY): Payer: Self-pay | Admitting: Cardiology

## 2017-02-01 ENCOUNTER — Telehealth: Payer: Self-pay

## 2017-02-01 NOTE — Telephone Encounter (Signed)
Patient contacted regarding discharge from [facility] on 01/29/2017.  Patient understands to follow up with provider Ermalinda Barrios on 02/08/2017 at 40 at [facility]. Patient understands discharge instructions? yes Patient understands medications and regiment? yes Patient understands to bring all medications to this visit?  No changes made to Pt medications  Outreach made to Pt.  Pt states he is doing well.  Pt very pleased he was able to discharge same day as stent placement.  Pt states wrist feels good, some slight bruising remains.  Pt asked about having one glass of wine with dinner.   Notified Pt that one glass of wine should be fine, but not to drink more than that.  Pt indicates understanding.  Pt denies any further educational needs.

## 2017-02-01 NOTE — Telephone Encounter (Signed)
Call made to Pt.  Call went to VM.  Left message requesting call back to this nurse.  Name and # left for call back.

## 2017-02-01 NOTE — Telephone Encounter (Signed)
-----   Message from Cheryln Manly, NP sent at 01/29/2017  2:24 PM EDT ----- Regarding: TOC f/u  Needs TOC f/u call  Thx Ria Comment

## 2017-02-02 ENCOUNTER — Telehealth (HOSPITAL_COMMUNITY): Payer: Self-pay

## 2017-02-02 NOTE — Telephone Encounter (Signed)
Patient insurances are active and benefits verified. Patient has Medicare A/B and AARP medicare supplement.  °Medicare A/B - no co-payment, deductible $183.00/$183.00 has been met, 20% co-insurance, no out of pocket, no pre-authorization and no limit on visit. Passport/reference #[id number]. °AARP medicare supplement - no co-payment, no deductible, no out of pocket, no co-insurance, no pre-authorization and no limit on visit. Follow medicare guidelines. Passport/reference #[id number]. ° °

## 2017-02-08 ENCOUNTER — Ambulatory Visit: Payer: Medicare Other | Admitting: Physician Assistant

## 2017-02-08 ENCOUNTER — Ambulatory Visit
Admission: RE | Admit: 2017-02-08 | Discharge: 2017-02-08 | Disposition: A | Discharge status: Home / Self Care | Payer: Medicare Other | Source: Ambulatory Visit | Attending: Internal Medicine | Admitting: Internal Medicine

## 2017-02-08 ENCOUNTER — Other Ambulatory Visit: Payer: Self-pay | Admitting: Internal Medicine

## 2017-02-08 DIAGNOSIS — R0789 Other chest pain: Secondary | ICD-10-CM | POA: Diagnosis not present

## 2017-02-08 DIAGNOSIS — R0781 Pleurodynia: Secondary | ICD-10-CM | POA: Diagnosis not present

## 2017-02-08 IMAGING — DX DG RIBS W/ CHEST 3+V*L*
3 series · 3 of 3 positions shown · non-contrast
Comparison: No prior rib imaging. Chest x-rays [DATE] dating
back to [DATE].

CLINICAL DATA: Four-month history of left anterior mid to lower rib
pain. No known injuries. Prior CABG.

EXAM:
LEFT RIBS AND CHEST - 3+ VIEW

[dg ribs unilateral w/chest left (1 of 3)]
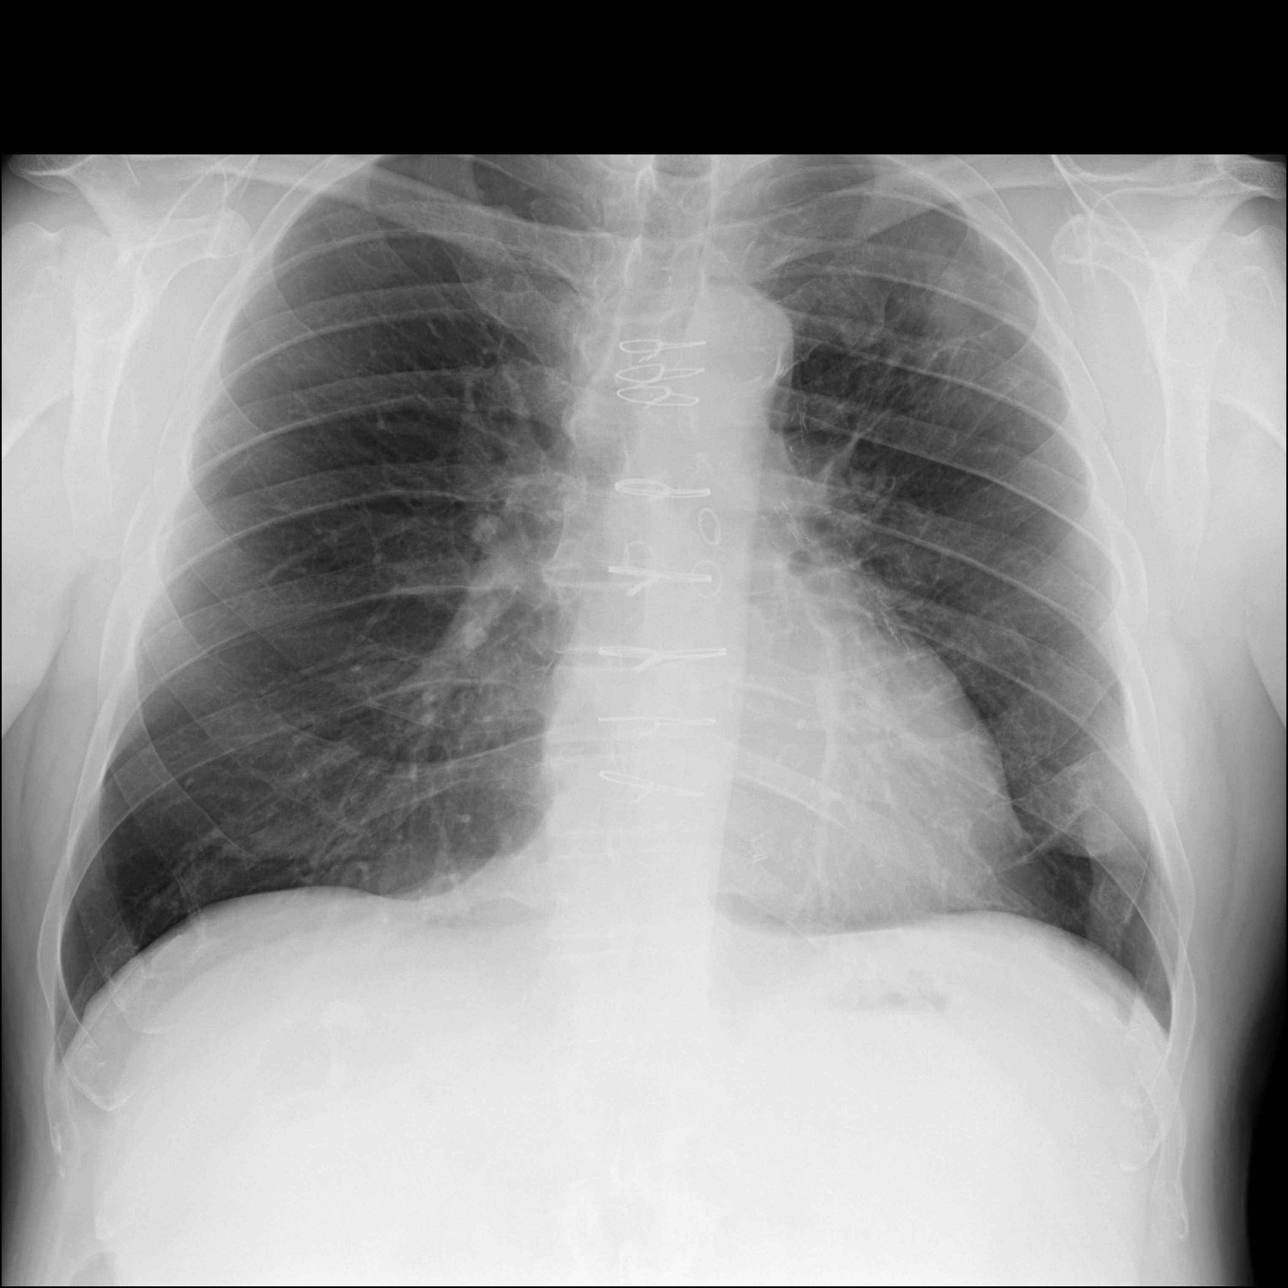

[dg ribs unilateral w/chest left (2 of 3)]
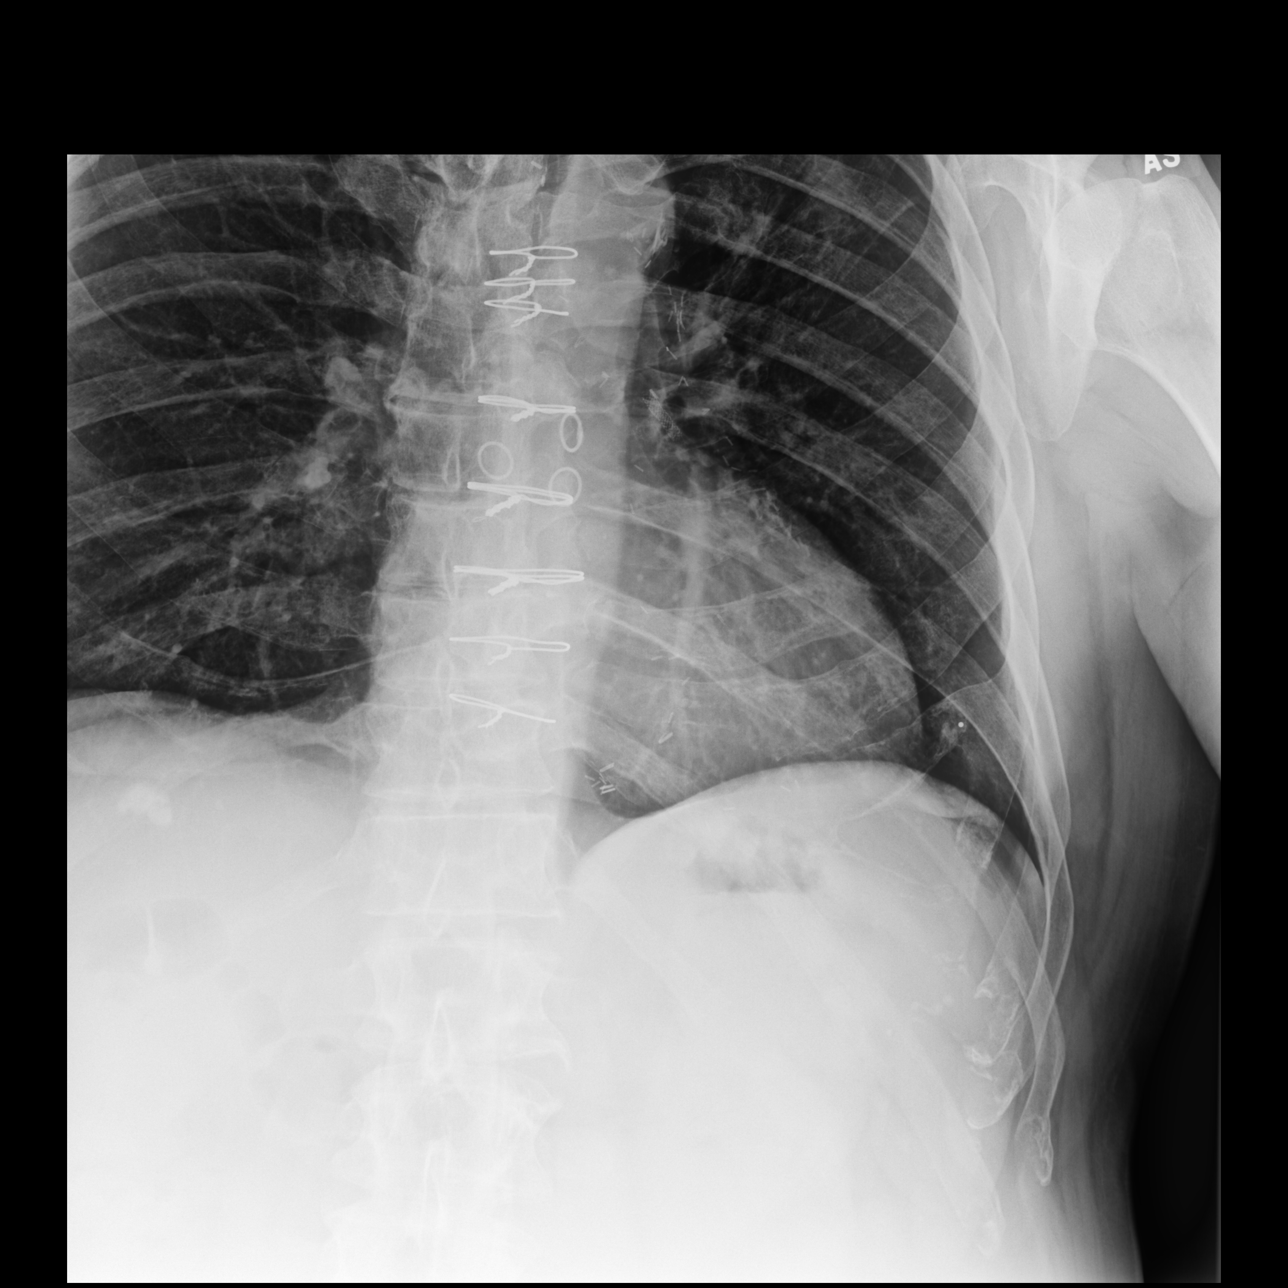

[dg ribs unilateral w/chest left (3 of 3)]
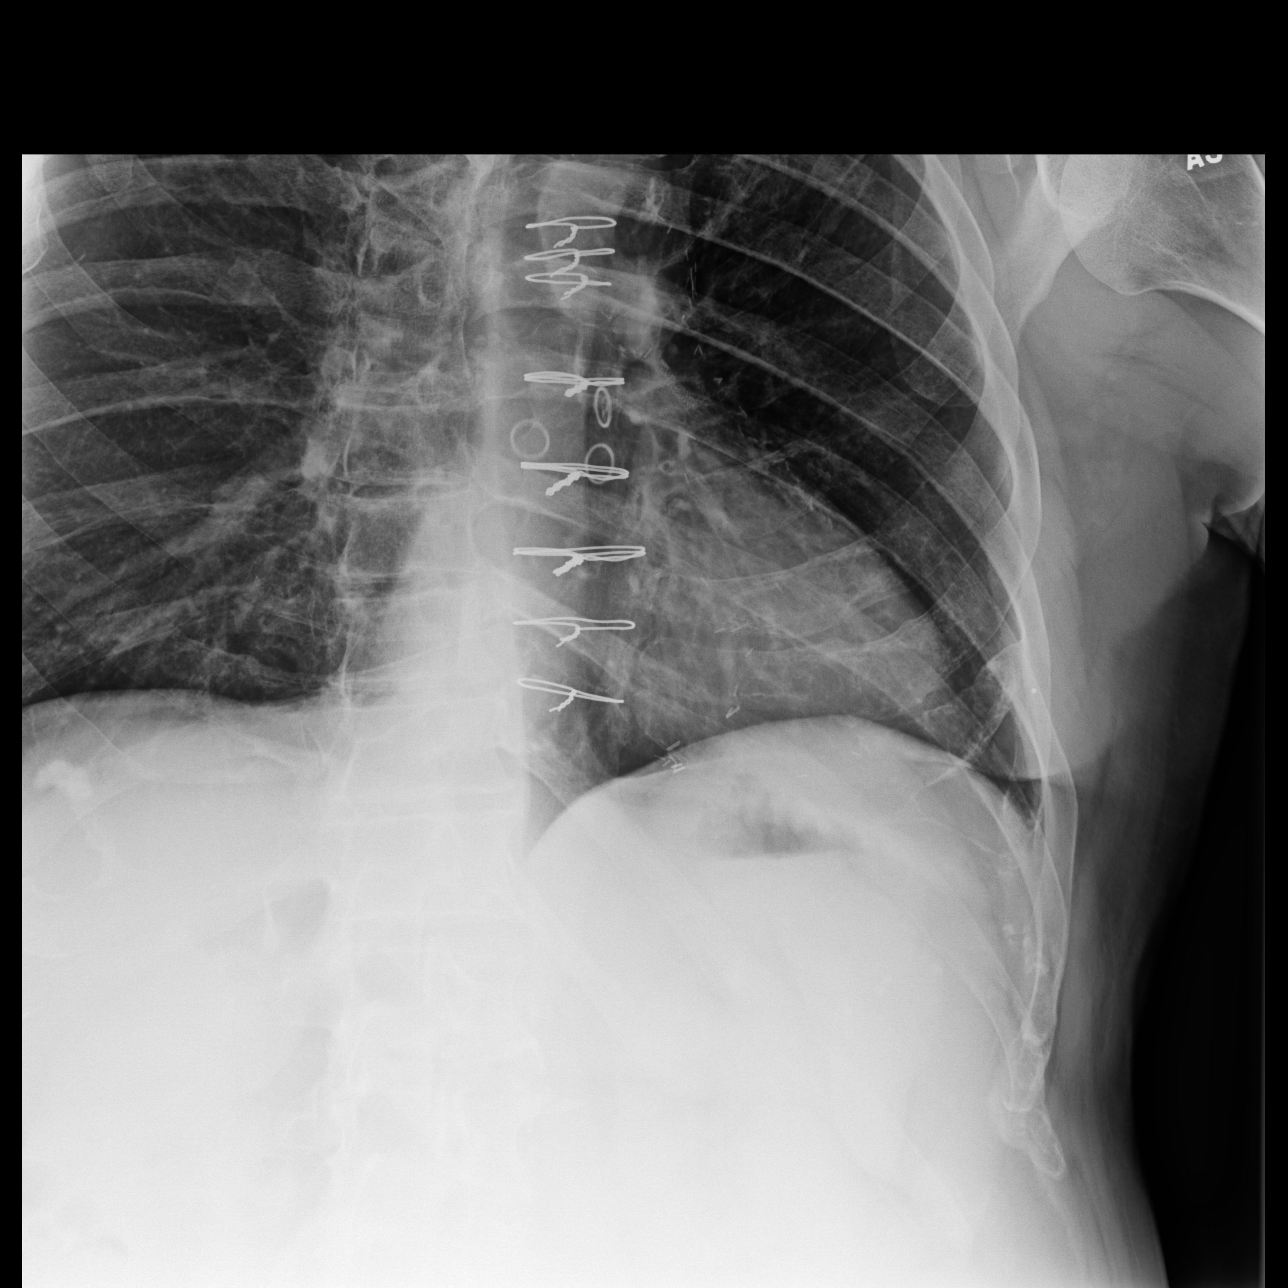

[3 of 3 positions shown; findings below may reference images not displayed]

FINDINGS: The site of maximum pain and tenderness was marked with a metallic
BB. Corresponding to the area of pain is the calcified costal
cartilage associated with a bifid left anterior seventh rib. There
is no evidence of acute, subacute or healed fractures involving the
left ribs. Prominent costal cartilage calcification is present.

Sternotomy for CABG. Cardiac silhouette normal in size. Thoracic
aorta mildly atherosclerotic. Hilar and mediastinal contours
otherwise unremarkable. Densely calcified granuloma deep in the left
lower lobe as noted on the [DATE] lateral image. Lungs otherwise
clear. Bronchovascular markings normal. Pulmonary vascularity
normal. No visible pleural effusions. No pneumothorax.
IMPRESSION: 1. No evidence of acute, subacute or healed left rib fractures.
2. Calcified costal cartilage associated with a bifid left anterior
seventh rib corresponds to the area of pain and tenderness.
Costochondritis is therefore suspected.
3.  No acute cardiopulmonary disease.
4. Thoracic aortic atherosclerosis.
Aortic Atherosclerosis ([5Z]-170.0)

## 2017-02-12 ENCOUNTER — Encounter: Payer: Self-pay | Admitting: Cardiology

## 2017-02-12 ENCOUNTER — Ambulatory Visit (INDEPENDENT_AMBULATORY_CARE_PROVIDER_SITE_OTHER): Payer: Medicare Other | Admitting: Cardiology

## 2017-02-12 VITALS — BP 142/86 | HR 64 | Ht 71.0 in | Wt 209.4 lb

## 2017-02-12 DIAGNOSIS — I2510 Atherosclerotic heart disease of native coronary artery without angina pectoris: Secondary | ICD-10-CM | POA: Diagnosis not present

## 2017-02-12 DIAGNOSIS — Z9861 Coronary angioplasty status: Secondary | ICD-10-CM

## 2017-02-12 MED ORDER — ROSUVASTATIN CALCIUM 40 MG PO TABS: 40.0000 mg | ORAL_TABLET | Freq: Every day | ORAL | 3 refills | Status: DC

## 2017-02-12 MED ORDER — EZETIMIBE 10 MG PO TABS: 10.0000 mg | ORAL_TABLET | Freq: Every day | ORAL | 3 refills | Status: DC

## 2017-02-12 NOTE — Patient Instructions (Addendum)
Medication Instructions:  Your physician recommends that you continue on your current medications as directed. Please refer to the Current Medication list given to you today.   Labwork: None ordered   Testing/Procedures: None ordered    Follow-Up: Your physician wants you to keep your follow-up appointment on 9/24 at 11 am with Dr. [name].

## 2017-02-12 NOTE — Progress Notes (Signed)
02/12/2017 John Hinton   Dec 12, 1945  [id number]  Primary Physician [name], MD Primary Cardiologist: Dr. [name]   Reason for Visit/CC: [facility] f/u for CAD s/p PCI   HPI:  John Hinton is a 70 y.o. male who is being seen today for post hospital f/u after recent admission for unstable angina, requiring PCI.   He is followed by Dr. [name] and has a h/o CABG and underwent stenting of his SVG to OM1 previously. Also a h/o treated HLD and HLD. He is on statin therapy with Crestor + Zetia. Recent lipid panel showed LDL at goal at 64 mg/dL. He is not on a BB 2/2 h/o bradycardia. He is intolerant to Imdur 2/2 HAs.   He recently was seen in the office on 7/11/8 and complained of recurrent exertional CP and dyspnea and was admitted to [facility] for unstable angina. LHC was performed by Dr. Martinique and he was found to have severe focal instent restenosis within the previously placed stent in the SVG-OM vessel. This was successfully treated with balloon angioplasty. His other grafts were patent including LIMA to LAD, SVG to diag and SVG to PDA. EF was normal.   He was continued on DAPT with ASA + Plavix, + Crestor and Zetia. No BB given borderline bradycardia.   He presents to clinic for post hospital f/u. He is here with his wife. He has done well since discharge. No recurrent CP or dyspnea. Fully compliant with meds. No abnormal bleeding. Right radial cath site is stable.     Current Meds  Medication Sig  . ALPRAZolam (XANAX) 0.5 MG tablet Take 0.5 mg by mouth 2 (two) times daily as needed for anxiety.  Marland Kitchen aspirin 81 MG tablet Take 81 mg by mouth at bedtime.   . cholecalciferol (VITAMIN D) 1000 UNITS tablet Take 1,000 Units by mouth at bedtime.   . clopidogrel (PLAVIX) 75 MG tablet TAKE 1 TABLET(75 MG) BY MOUTH DAILY WITH BREAKFAST  . doxazosin (CARDURA) 2 MG tablet TAKE 1 TABLET(2 MG) BY MOUTH DAILY (Patient taking differently: TAKE 1 TABLET(2 MG) BY MOUTH AT BEDTIME)  . ezetimibe  (ZETIA) 10 MG tablet Take 1 tablet (10 mg total) by mouth at bedtime.  . folic acid (FOLVITE) 789 MCG tablet Take 400 mcg by mouth daily.  . Multiple Vitamin (MULTIVITAMIN) tablet Take 1 tablet by mouth daily.  . nitroGLYCERIN (NITROSTAT) 0.4 MG SL tablet Place 1 tablet (0.4 mg total) under the tongue every 5 (five) minutes as needed for chest pain.  . ramipril (ALTACE) 10 MG capsule Take 2 capsules (20 mg total) by mouth daily.  . rosuvastatin (CRESTOR) 40 MG tablet Take 1 tablet (40 mg total) by mouth at bedtime.  . sertraline (ZOLOFT) 50 MG tablet Take 50 mg by mouth daily.  Marland Kitchen spironolactone (ALDACTONE) 25 MG tablet TAKE 1 TABLET(25 MG) BY MOUTH DAILY  . [DISCONTINUED] ezetimibe (ZETIA) 10 MG tablet Take 1 tablet (10 mg total) by mouth daily. (Patient taking differently: Take 10 mg by mouth at bedtime. )  . [DISCONTINUED] ezetimibe (ZETIA) 10 MG tablet Take 1 tablet (10 mg total) by mouth at bedtime.  . [DISCONTINUED] ezetimibe (ZETIA) 10 MG tablet Take 1 tablet (10 mg total) by mouth at bedtime.  . [DISCONTINUED] isosorbide mononitrate (IMDUR) 30 MG 24 hr tablet Take 1 tablet (30 mg total) by mouth daily.  . [DISCONTINUED] rosuvastatin (CRESTOR) 40 MG tablet Take 1 tablet (40 mg total) by mouth daily. (Patient taking differently: Take 40 mg by mouth  at bedtime. )  . [DISCONTINUED] rosuvastatin (CRESTOR) 40 MG tablet Take 1 tablet (40 mg total) by mouth at bedtime.  . [DISCONTINUED] rosuvastatin (CRESTOR) 40 MG tablet Take 1 tablet (40 mg total) by mouth at bedtime.   Allergies  Allergen Reactions  . Amlodipine Swelling    Edema   Past Medical History:  Diagnosis Date  . Arrhythmia    Post-op  . BPH (benign prostatic hypertrophy)   . Bradycardia    a. H/o asymptomatic bradycardia.  . Coronary artery disease    a. s/p CABG ~2007 with LIMA to LAD, SVG seq to OM1 and OM2, SVG to PDA and SVG to diag. b. Abnormal nuc 03/2015 - s/p DES to SVG-OM1-OM2. 01/29/17 POBA with cutting balloon to  SVG-->OM  . DCM (dilated cardiomyopathy) ([place]) 09/17/2015  . Dyslipidemia   . ED (erectile dysfunction)   . Fear of heights   . Hypertension   . Ischemic cardiomyopathy    a. EF 46% by nuc 03/2015.  Marland Kitchen OSA (obstructive sleep apnea) 07/12/2015   Moderate OSA with AHI 16.5/hr now on CPAP at 8cm H2O  . RBBB   . Shoulder, capsulitis, adhesive    Left Shoulder   Family History  Problem Relation Age of Onset  . Lung cancer Mother   . Lung cancer Father   . Heart disease Father   . Heart failure Father   . Cancer Sister    Past Surgical History:  Procedure Laterality Date  . CARDIAC CATHETERIZATION N/A 04/05/2015   Procedure: Left Heart Cath and Coronary Angiography;  Surgeon: Sherren Mocha, MD;  Location: [facility];  Service: Cardiovascular;  Laterality: N/A;  . CORONARY ARTERY BYPASS GRAFT     w/LIMA to LAD, seq SVG to OM1 and OM2, SVG to PDA and SVG to Diagonal  . CORONARY BALLOON ANGIOPLASTY N/A 01/29/2017   Procedure: Coronary Balloon Angioplasty;  Surgeon: Martinique, Peter M, MD;  Location: [facility];  Service: Cardiovascular;  Laterality: N/A;  . LEFT HEART CATH AND CORS/GRAFTS ANGIOGRAPHY N/A 01/29/2017   Procedure: Left Heart Cath and Cors/Grafts Angiography;  Surgeon: Martinique, Peter M, MD;  Location: [facility];  Service: Cardiovascular;  Laterality: N/A;   Social History   Social History  . Marital status: Married    Spouse name: N/A  . Number of children: N/A  . Years of education: N/A   Occupational History  . Not on file.   Social History Main Topics  . Smoking status: Former Smoker    Types: Cigarettes    Quit date: 07/21/1983  . Smokeless tobacco: Never Used  . Alcohol use 1.2 oz/week    2 Cans of beer per week     Comment: daily  . Drug use: No  . Sexual activity: Not on file   Other Topics Concern  . Not on file   Social History Narrative  . No narrative on file     Review of Systems: General: negative for chills, fever, night  sweats or weight changes.  Cardiovascular: negative for chest pain, dyspnea on exertion, edema, orthopnea, palpitations, paroxysmal nocturnal dyspnea or shortness of breath Dermatological: negative for rash Respiratory: negative for cough or wheezing Urologic: negative for hematuria Abdominal: negative for nausea, vomiting, diarrhea, bright red blood per rectum, melena, or hematemesis Neurologic: negative for visual changes, syncope, or dizziness All other systems reviewed and are otherwise negative except as noted above.   Physical Exam:  Blood pressure (!) 142/86, pulse 64, height 5'  11" (1.803 m), weight 209 lb 6.4 oz (95 kg).  General appearance: alert, cooperative and no distress Neck: no carotid bruit and no JVD Lungs: clear to auscultation bilaterally Heart: regular rate and rhythm, S1, S2 normal, no murmur, click, rub or gallop Extremities: extremities normal, atraumatic, no cyanosis or edema Pulses: 2+ and symmetric Skin: Skin color, texture, turgor normal. No rashes or lesions Neurologic: Grossly normal  EKG not performed -- personally reviewed   ASSESSMENT AND PLAN:   1. CAD: h/o CABG with recent LHC 01/29/17 showing patent LIMA-LAD, SVG- PDA and SVG to diag. SVG to OM was occluded by ISR of previously placed stent, successfully treated with balloon angioplasty. LVEF normal. He is stable w/o recurrent CP. No dyspnea. Cath site is stable. Continue DAPT with ASA + Pavix, + zetia. No BB given h/o bradycardia. Resting HR currently low 60s. He has PRN SL NTG.    2. HTN: controlled on current regimen.   3. HLD: last lipid panel at goal at 64 mg/dL. Continue Crestor and zetia. Repeat FLP when he has his yearly f/u with Dr. [name] in Sep.   4. OSA: compliant with CPAP.    Follow-Up: keep f/u with Dr. [name] in September.   John Hinton, MHS [facility] 02/12/2017 11:17 AM

## 2017-02-18 ENCOUNTER — Telehealth (HOSPITAL_COMMUNITY): Payer: Self-pay

## 2017-02-18 NOTE — Telephone Encounter (Signed)
I called and left message on patient voicemail to contact office about scheduling for cardiac rehab. I left office contact information on patient voicemail to return call.

## 2017-02-23 ENCOUNTER — Other Ambulatory Visit: Payer: Self-pay | Admitting: Cardiology

## 2017-02-24 ENCOUNTER — Encounter (HOSPITAL_COMMUNITY): Payer: Self-pay

## 2017-02-24 ENCOUNTER — Telehealth (HOSPITAL_COMMUNITY): Payer: Self-pay

## 2017-02-24 NOTE — Telephone Encounter (Signed)
I called and left message on patient voicemail to contact office about scheduling for cardiac rehab. I left office contact information on patient voicemail to return call.

## 2017-03-22 ENCOUNTER — Other Ambulatory Visit: Payer: Self-pay | Admitting: Cardiology

## 2017-03-23 ENCOUNTER — Encounter: Payer: Self-pay | Admitting: *Deleted

## 2017-03-25 DIAGNOSIS — Z23 Encounter for immunization: Secondary | ICD-10-CM | POA: Diagnosis not present

## 2017-03-28 ENCOUNTER — Other Ambulatory Visit: Payer: Self-pay | Admitting: Cardiology

## 2017-03-31 DIAGNOSIS — H16223 Keratoconjunctivitis sicca, not specified as Sjogren's, bilateral: Secondary | ICD-10-CM | POA: Diagnosis not present

## 2017-04-05 ENCOUNTER — Other Ambulatory Visit: Payer: Self-pay | Admitting: Physician Assistant

## 2017-04-12 ENCOUNTER — Ambulatory Visit: Payer: Medicare Other | Admitting: Cardiology

## 2017-04-13 ENCOUNTER — Ambulatory Visit: Payer: Medicare Other | Admitting: Physician Assistant

## 2017-04-19 ENCOUNTER — Encounter: Payer: Self-pay | Admitting: Cardiology

## 2017-04-20 ENCOUNTER — Encounter: Payer: Self-pay | Admitting: Cardiology

## 2017-04-20 ENCOUNTER — Ambulatory Visit (INDEPENDENT_AMBULATORY_CARE_PROVIDER_SITE_OTHER): Payer: Medicare Other | Admitting: Cardiology

## 2017-04-20 VITALS — BP 134/82 | HR 65 | Ht 71.0 in | Wt 210.4 lb

## 2017-04-20 DIAGNOSIS — Z1389 Encounter for screening for other disorder: Secondary | ICD-10-CM | POA: Diagnosis not present

## 2017-04-20 DIAGNOSIS — E7800 Pure hypercholesterolemia, unspecified: Secondary | ICD-10-CM

## 2017-04-20 DIAGNOSIS — Z9861 Coronary angioplasty status: Secondary | ICD-10-CM

## 2017-04-20 DIAGNOSIS — I255 Ischemic cardiomyopathy: Secondary | ICD-10-CM | POA: Diagnosis not present

## 2017-04-20 DIAGNOSIS — I2510 Atherosclerotic heart disease of native coronary artery without angina pectoris: Secondary | ICD-10-CM

## 2017-04-20 DIAGNOSIS — I420 Dilated cardiomyopathy: Secondary | ICD-10-CM | POA: Diagnosis not present

## 2017-04-20 DIAGNOSIS — F40241 Acrophobia: Secondary | ICD-10-CM | POA: Diagnosis not present

## 2017-04-20 DIAGNOSIS — I10 Essential (primary) hypertension: Secondary | ICD-10-CM

## 2017-04-20 DIAGNOSIS — G4733 Obstructive sleep apnea (adult) (pediatric): Secondary | ICD-10-CM | POA: Diagnosis not present

## 2017-04-20 DIAGNOSIS — Z0000 Encounter for general adult medical examination without abnormal findings: Secondary | ICD-10-CM | POA: Diagnosis not present

## 2017-04-20 DIAGNOSIS — Z125 Encounter for screening for malignant neoplasm of prostate: Secondary | ICD-10-CM | POA: Diagnosis not present

## 2017-04-20 NOTE — Patient Instructions (Signed)

## 2017-04-20 NOTE — Progress Notes (Signed)
Cardiology Office Note:    Date:  04/20/2017   ID:  John Hinton, DOB Nov 29, 1945, MRN [id number]  PCP:  [name], MD  Cardiologist:  Fransico Him, MD   Referring MD: [name], MD   Chief Complaint  Patient presents with  . Coronary Artery Disease  . Hypertension  . Sleep Apnea  . Hyperlipidemia    History of Present Illness:    John Hinton is a 71 y.o. male with a hx of h/o CABG and underwent stenting of his SVG to OM1 previously. Also a h/o treated HTN and HLD. He is on statin therapy with Crestor + Zetia.  He was seen in the office on 7/11/8 and complained of recurrent exertional CP and dyspnea and was admitted to [facility] for unstable angina. LHC was performed by Dr. Martinique and he was found to have severe focal instent restenosis within the previously placed stent in the SVG-OM vessel. This was successfully treated with balloon angioplasty. His other grafts were patent including LIMA to LAD, SVG to diag and SVG to PDA. EF was normal. He also has moderate OSA with an AHI of 16.5/hr and is on CPAP at 8cm H2O.    He is here today for followup and is doing well.  He denies any chest pain or pressure, PND, orthopnea, LE edema, dizziness (except for bending over), palpitations or syncope. He has chronic DOE when going up stairs but this is stable.  He is compliant with his meds and is tolerating meds with no SE.  He is doing well with his CPAP device and thinks that he has gotten used to it.  He tolerates the full face mask and feels the pressure is adequate.  Since going on CPAP he feels rested in the am and has no significant daytime sleepiness.  He denies any significant nasal dryness or nasal congestion but has some mild mouth dryness.  He does not think that he snores.      Past Medical History:  Diagnosis Date  . Arrhythmia    Post-op  . BPH (benign prostatic hypertrophy)   . Bradycardia    a. H/o asymptomatic bradycardia.  . Coronary artery disease    a. s/p CABG  ~2007 with LIMA to LAD, SVG seq to OM1 and OM2, SVG to PDA and SVG to diag. b. Abnormal nuc 03/2015 - s/p DES to SVG-OM1-OM2. 01/29/17 POBA with cutting balloon to SVG-->OM  . DCM (dilated cardiomyopathy) ([place]) 09/17/2015  . Dyslipidemia   . ED (erectile dysfunction)   . Fear of heights   . Hypertension   . Ischemic cardiomyopathy    a. EF 46% by nuc 03/2015.  Marland Kitchen OSA (obstructive sleep apnea) 07/12/2015   Moderate OSA with AHI 16.5/hr now on CPAP at 8cm H2O  . RBBB   . Shoulder, capsulitis, adhesive    Left Shoulder    Past Surgical History:  Procedure Laterality Date  . CARDIAC CATHETERIZATION N/A 04/05/2015   Procedure: Left Heart Cath and Coronary Angiography;  Surgeon: Sherren Mocha, MD;  Location: [place] [facility];  Service: Cardiovascular;  Laterality: N/A;  . CORONARY ARTERY BYPASS GRAFT     w/LIMA to LAD, seq SVG to OM1 and OM2, SVG to PDA and SVG to Diagonal  . CORONARY BALLOON ANGIOPLASTY N/A 01/29/2017   Procedure: Coronary Balloon Angioplasty;  Surgeon: Martinique, Peter M, MD;  Location: [place] [facility];  Service: Cardiovascular;  Laterality: N/A;  . LEFT HEART CATH AND CORS/GRAFTS ANGIOGRAPHY N/A 01/29/2017   Procedure:  Left Heart Cath and Cors/Grafts Angiography;  Surgeon: Martinique, Peter M, MD;  Location: [place] [facility];  Service: Cardiovascular;  Laterality: N/A;    Current Medications: No outpatient prescriptions have been marked as taking for the 04/20/17 encounter (Office Visit) with Sueanne Margarita, MD.     Allergies:   Amlodipine   Social History   Social History  . Marital status: Married    Spouse name: N/A  . Number of children: N/A  . Years of education: N/A   Social History Main Topics  . Smoking status: Former Smoker    Types: Cigarettes    Quit date: 07/21/1983  . Smokeless tobacco: Never Used  . Alcohol use 1.2 oz/week    2 Cans of beer per week     Comment: daily  . Drug use: No  . Sexual activity: Not Asked   Other Topics Concern  . None     Social History Narrative  . None     Family History: The patient's family history includes Cancer in his sister; Heart disease in his father; Heart failure in his father; Lung cancer in his father and mother.  ROS:   Please see the history of present illness.    ROS  All other systems reviewed and negative.   EKGs/Labs/Other Studies Reviewed:    The following studies were reviewed today: none  EKG:  EKG is not  ordered today.   Recent Labs: 01/27/2017: BUN 18; Creatinine, Ser 0.92; Hemoglobin 13.7; Platelets 165; Potassium 4.3; Sodium 140   Recent Lipid Panel    Component Value Date/Time   CHOL 141 03/26/2016 1009   TRIG 70 03/26/2016 1009   HDL 63 03/26/2016 1009   CHOLHDL 2.2 03/26/2016 1009   VLDL 14 03/26/2016 1009   LDLCALC 64 03/26/2016 1009    Physical Exam:    VS:  BP 134/82   Pulse 65   Ht 5\' 11"  (1.803 m)   Wt 210 lb 6.4 oz (95.4 kg)   SpO2 97%   BMI 29.34 kg/m     Wt Readings from Last 3 Encounters:  04/20/17 210 lb 6.4 oz (95.4 kg)  02/12/17 209 lb 6.4 oz (95 kg)  01/29/17 215 lb (97.5 kg)     GEN:  Well nourished, well developed in no acute distress HEENT: Normal NECK: No JVD; No carotid bruits LYMPHATICS: No lymphadenopathy CARDIAC: RRR, no murmurs, rubs, gallops RESPIRATORY:  Clear to auscultation without rales, wheezing or rhonchi  ABDOMEN: Soft, non-tender, non-distended MUSCULOSKELETAL:  No edema; No deformity  SKIN: Warm and dry NEUROLOGIC:  Alert and oriented x 3 PSYCHIATRIC:  Normal affect   ASSESSMENT:    1. Coronary artery disease involving native coronary artery of native heart without angina pectoris   2. Essential hypertension, benign   3. DCM (dilated cardiomyopathy) ([place])   4. Pure hypercholesterolemia   5. OSA (obstructive sleep apnea)    PLAN:    In order of problems listed above:  1.  ASCAD - h/o CABG s/p PCI of the SVG to OM1 and recent cath showing severe focal instent restenosis within the previously placed  stent in the SVG-OM vessel. This was successfully treated with balloon angioplasty. His other grafts were patent including LIMA to LAD, SVG to diag and SVG to PDA. EF was normal. He denies any recent anginal symptoms.  He will continue on DAPT with ASA 81mg  daily, Plavix 75mg  daily and statin.    2.  HTN - BP is well controlled on current  meds.  He will continue on Altace 10mg  daily, doxazosin 2mg  daily, aldactone 25mg  daily.  His last BMET in July showed a normal creatinine at 0.92.  3.  DCM - LVF normalized with EF 55-60% by echo 09/2015.    4.  Hyperlipidemia with LDL goal < 70.  He will continue on crestor 40mg  daily and zetia 10mg  daily.  I will get an FLP and ALT.    5.  OSA - the patient is tolerating PAP therapy well without any problems. The PAP download was reviewed today and showed an AHI of 2.7/hr on 8 cm H2O with 87% compliance in using more than 4 hours nightly.  The patient has been using and benefiting from CPAP use and will continue to benefit from therapy.       Medication Adjustments/Labs and Tests Ordered: Current medicines are reviewed at length with the patient today.  Concerns regarding medicines are outlined above.  No orders of the defined types were placed in this encounter.  No orders of the defined types were placed in this encounter.   Signed, Fransico Him, MD  04/20/2017 1:58 PM    [facility]

## 2017-05-19 DIAGNOSIS — F4323 Adjustment disorder with mixed anxiety and depressed mood: Secondary | ICD-10-CM | POA: Diagnosis not present

## 2017-05-19 DIAGNOSIS — F40228 Other natural environment type phobia: Secondary | ICD-10-CM | POA: Diagnosis not present

## 2017-05-19 DIAGNOSIS — F411 Generalized anxiety disorder: Secondary | ICD-10-CM | POA: Diagnosis not present

## 2017-06-24 ENCOUNTER — Other Ambulatory Visit: Payer: Self-pay | Admitting: Cardiology

## 2017-07-26 DIAGNOSIS — D1801 Hemangioma of skin and subcutaneous tissue: Secondary | ICD-10-CM | POA: Diagnosis not present

## 2017-07-26 DIAGNOSIS — L821 Other seborrheic keratosis: Secondary | ICD-10-CM | POA: Diagnosis not present

## 2017-07-26 DIAGNOSIS — D225 Melanocytic nevi of trunk: Secondary | ICD-10-CM | POA: Diagnosis not present

## 2017-07-26 DIAGNOSIS — L814 Other melanin hyperpigmentation: Secondary | ICD-10-CM | POA: Diagnosis not present

## 2017-08-27 DIAGNOSIS — M7501 Adhesive capsulitis of right shoulder: Secondary | ICD-10-CM | POA: Diagnosis not present

## 2017-08-30 DIAGNOSIS — M7501 Adhesive capsulitis of right shoulder: Secondary | ICD-10-CM | POA: Diagnosis not present

## 2017-08-30 DIAGNOSIS — M25511 Pain in right shoulder: Secondary | ICD-10-CM | POA: Diagnosis not present

## 2017-09-01 DIAGNOSIS — M25511 Pain in right shoulder: Secondary | ICD-10-CM | POA: Diagnosis not present

## 2017-09-01 DIAGNOSIS — M7501 Adhesive capsulitis of right shoulder: Secondary | ICD-10-CM | POA: Diagnosis not present

## 2017-09-06 DIAGNOSIS — M7501 Adhesive capsulitis of right shoulder: Secondary | ICD-10-CM | POA: Diagnosis not present

## 2017-09-06 DIAGNOSIS — M25511 Pain in right shoulder: Secondary | ICD-10-CM | POA: Diagnosis not present

## 2017-09-08 DIAGNOSIS — M7501 Adhesive capsulitis of right shoulder: Secondary | ICD-10-CM | POA: Diagnosis not present

## 2017-09-08 DIAGNOSIS — M25511 Pain in right shoulder: Secondary | ICD-10-CM | POA: Diagnosis not present

## 2017-09-13 DIAGNOSIS — M7501 Adhesive capsulitis of right shoulder: Secondary | ICD-10-CM | POA: Diagnosis not present

## 2017-09-13 DIAGNOSIS — M25511 Pain in right shoulder: Secondary | ICD-10-CM | POA: Diagnosis not present

## 2017-09-15 DIAGNOSIS — M25511 Pain in right shoulder: Secondary | ICD-10-CM | POA: Diagnosis not present

## 2017-09-15 DIAGNOSIS — M7501 Adhesive capsulitis of right shoulder: Secondary | ICD-10-CM | POA: Diagnosis not present

## 2017-09-27 DIAGNOSIS — M25511 Pain in right shoulder: Secondary | ICD-10-CM | POA: Diagnosis not present

## 2017-09-27 DIAGNOSIS — M7501 Adhesive capsulitis of right shoulder: Secondary | ICD-10-CM | POA: Diagnosis not present

## 2017-09-29 DIAGNOSIS — M25511 Pain in right shoulder: Secondary | ICD-10-CM | POA: Diagnosis not present

## 2017-09-29 DIAGNOSIS — M7501 Adhesive capsulitis of right shoulder: Secondary | ICD-10-CM | POA: Diagnosis not present

## 2017-10-04 DIAGNOSIS — M7501 Adhesive capsulitis of right shoulder: Secondary | ICD-10-CM | POA: Diagnosis not present

## 2017-10-04 DIAGNOSIS — M25551 Pain in right hip: Secondary | ICD-10-CM | POA: Diagnosis not present

## 2017-10-06 DIAGNOSIS — M25511 Pain in right shoulder: Secondary | ICD-10-CM | POA: Diagnosis not present

## 2017-10-06 DIAGNOSIS — M7501 Adhesive capsulitis of right shoulder: Secondary | ICD-10-CM | POA: Diagnosis not present

## 2017-10-08 DIAGNOSIS — M7501 Adhesive capsulitis of right shoulder: Secondary | ICD-10-CM | POA: Diagnosis not present

## 2017-10-22 ENCOUNTER — Ambulatory Visit: Payer: Medicare Other | Admitting: Cardiology

## 2017-11-03 DIAGNOSIS — F411 Generalized anxiety disorder: Secondary | ICD-10-CM | POA: Diagnosis not present

## 2017-11-03 DIAGNOSIS — F4323 Adjustment disorder with mixed anxiety and depressed mood: Secondary | ICD-10-CM | POA: Diagnosis not present

## 2017-11-03 DIAGNOSIS — F40228 Other natural environment type phobia: Secondary | ICD-10-CM | POA: Diagnosis not present

## 2017-12-07 ENCOUNTER — Ambulatory Visit (INDEPENDENT_AMBULATORY_CARE_PROVIDER_SITE_OTHER): Payer: Medicare Other | Admitting: Cardiology

## 2017-12-07 ENCOUNTER — Encounter: Payer: Self-pay | Admitting: Cardiology

## 2017-12-07 VITALS — BP 104/58 | HR 54 | Ht 71.0 in | Wt 210.4 lb

## 2017-12-07 DIAGNOSIS — E7800 Pure hypercholesterolemia, unspecified: Secondary | ICD-10-CM | POA: Diagnosis not present

## 2017-12-07 DIAGNOSIS — I10 Essential (primary) hypertension: Secondary | ICD-10-CM | POA: Diagnosis not present

## 2017-12-07 DIAGNOSIS — I2510 Atherosclerotic heart disease of native coronary artery without angina pectoris: Secondary | ICD-10-CM | POA: Diagnosis not present

## 2017-12-07 DIAGNOSIS — G4733 Obstructive sleep apnea (adult) (pediatric): Secondary | ICD-10-CM | POA: Diagnosis not present

## 2017-12-07 LAB — BASIC METABOLIC PANEL
BUN / CREAT RATIO: 19 (ref 10–24)
BUN: 19 mg/dL (ref 8–27)
CO2: 21 mmol/L (ref 20–29)
CREATININE: 1 mg/dL (ref 0.76–1.27)
Calcium: 9.8 mg/dL (ref 8.6–10.2)
Chloride: 103 mmol/L (ref 96–106)
GFR calc Af Amer: 88 mL/min/{1.73_m2} (ref >59)
GFR calc non Af Amer: 76 mL/min/{1.73_m2} (ref >59)
GLUCOSE: 91 mg/dL (ref 65–99)
Potassium: 4.3 mmol/L (ref 3.5–5.2)
SODIUM: 140 mmol/L (ref 134–144)

## 2017-12-07 LAB — HEPATIC FUNCTION PANEL
ALT: 23 IU/L (ref 0–44)
AST: 23 IU/L (ref 0–40)
Albumin: 5 g/dL — ABNORMAL HIGH (ref 3.6–4.8)
Alkaline Phosphatase: 58 IU/L (ref 39–117)
BILIRUBIN, DIRECT: 0.13 mg/dL (ref 0.00–0.40)
Bilirubin Total: 0.4 mg/dL (ref 0.0–1.2)
TOTAL PROTEIN: 7.2 g/dL (ref 6.0–8.5)

## 2017-12-07 LAB — LIPID PANEL
CHOL/HDL RATIO: 3.2 ratio (ref 0.0–5.0)
CHOLESTEROL TOTAL: 121 mg/dL (ref 100–199)
HDL: 38 mg/dL — ABNORMAL LOW (ref >39)
LDL CALC: 63 mg/dL (ref 0–99)
Triglycerides: 98 mg/dL (ref 0–149)
VLDL Cholesterol Cal: 20 mg/dL (ref 5–40)

## 2017-12-07 NOTE — Patient Instructions (Signed)
Medication Instructions:  Your physician recommends that you continue on your current medications as directed. Please refer to the Current Medication list given to you today.  Labwork: Today for kidney function, liver function, and fasting lipids  Testing/Procedures: None ordered   Follow-Up: Your physician wants you to follow-up in: 1 year with Dr. [name]. You will receive a reminder letter in the mail two months in advance. If you don't receive a letter, please call our office to schedule the follow-up appointment.  Any Other Special Instructions Will Be Listed Below (If Applicable).  Thank you for choosing [facility], RN  [phone]   If you need a refill on your cardiac medications before your next appointment, please call your pharmacy.

## 2017-12-07 NOTE — Progress Notes (Signed)
Cardiology Office Note:    Date:  12/07/2017   ID:  John Hinton, DOB 1945/09/10, MRN [id number]  PCP:  [name], MD  Cardiologist:  No primary care provider on file.    Referring MD: [name], MD   Chief Complaint  Patient presents with  . Coronary Artery Disease  . Hypertension  . Hyperlipidemia  . Sleep Apnea    History of Present Illness:    John Hinton is a 72 y.o. male with a hx of ASCAD s/p CABG and then PCI of SVG to OM1.  Repeat heart cath for unstable angina on 01/27/2017 showed severe focal in-stent restenosis in the SVG to OM stent status post balloon angioplasty.  All of the grafts were patent.  He has a history of hyperlipidemia and hypertension as well as moderate OSA with a PSG showing an AHI of 16.5/hr and is on CPAP at 8cm H2O.    He is here today for followup and is doing well.  He denies any chest pain or pressure, SOB, DOE, PND, orthopnea, LE edema, dizziness, palpitations or syncope. He is compliant with his meds and is tolerating meds with no SE.  He is doing well with his CPAP device and thinks that he has gotten used to it.  He tolerates the mask and feels the pressure is adequate.  Since going on CPAP he feels rested in the am and has no significant daytime sleepiness.  He denies any significant mouth or nasal dryness or nasal congestion.  He does not think that he snores.       Past Medical History:  Diagnosis Date  . Arrhythmia    Post-op  . BPH (benign prostatic hypertrophy)   . Bradycardia    a. H/o asymptomatic bradycardia.  . Coronary artery disease    a. s/p CABG ~2007 with LIMA to LAD, SVG seq to OM1 and OM2, SVG to PDA and SVG to diag. b. Abnormal nuc 03/2015 - s/p DES to SVG-OM1-OM2. 01/29/17 POBA with cutting balloon to SVG-->OM  . DCM (dilated cardiomyopathy) ([place]) 09/17/2015  . Dyslipidemia   . ED (erectile dysfunction)   . Fear of heights   . Hypertension   . Ischemic cardiomyopathy    a. EF 46% by nuc 03/2015.  Marland Kitchen OSA  (obstructive sleep apnea) 07/12/2015   Moderate OSA with AHI 16.5/hr now on CPAP at 8cm H2O  . RBBB   . Shoulder, capsulitis, adhesive    Left Shoulder    Past Surgical History:  Procedure Laterality Date  . CARDIAC CATHETERIZATION N/A 04/05/2015   Procedure: Left Heart Cath and Coronary Angiography;  Surgeon: Sherren Mocha, MD;  Location: [place] [facility];  Service: Cardiovascular;  Laterality: N/A;  . CORONARY ARTERY BYPASS GRAFT     w/LIMA to LAD, seq SVG to OM1 and OM2, SVG to PDA and SVG to Diagonal  . CORONARY BALLOON ANGIOPLASTY N/A 01/29/2017   Procedure: Coronary Balloon Angioplasty;  Surgeon: Martinique, Peter M, MD;  Location: [place] [facility];  Service: Cardiovascular;  Laterality: N/A;  . LEFT HEART CATH AND CORS/GRAFTS ANGIOGRAPHY N/A 01/29/2017   Procedure: Left Heart Cath and Cors/Grafts Angiography;  Surgeon: Martinique, Peter M, MD;  Location: [place] [facility];  Service: Cardiovascular;  Laterality: N/A;    Current Medications: Current Meds  Medication Sig  . ALPRAZolam (XANAX) 0.5 MG tablet Take 0.5 mg by mouth 2 (two) times daily as needed for anxiety.  Marland Kitchen aspirin 81 MG tablet Take 81 mg by mouth at  bedtime.   . cholecalciferol (VITAMIN D) 1000 UNITS tablet Take 1,000 Units by mouth at bedtime.   . clopidogrel (PLAVIX) 75 MG tablet TAKE 1 TABLET(75 MG) BY MOUTH DAILY WITH BREAKFAST  . doxazosin (CARDURA) 2 MG tablet TAKE 1 TABLET(2 MG) BY MOUTH DAILY  . ezetimibe (ZETIA) 10 MG tablet Take 1 tablet (10 mg total) by mouth at bedtime.  . folic acid (FOLVITE) 270 MCG tablet Take 400 mcg by mouth daily.  . Multiple Vitamin (MULTIVITAMIN) tablet Take 1 tablet by mouth daily.  . nitroGLYCERIN (NITROSTAT) 0.4 MG SL tablet Place 1 tablet (0.4 mg total) under the tongue every 5 (five) minutes as needed for chest pain.  . ramipril (ALTACE) 10 MG capsule TAKE 2 CAPSULES(20 MG) BY MOUTH DAILY  . rosuvastatin (CRESTOR) 40 MG tablet Take 1 tablet (40 mg total) by mouth at bedtime.  .  sertraline (ZOLOFT) 50 MG tablet Take 50 mg by mouth daily.  Marland Kitchen spironolactone (ALDACTONE) 25 MG tablet TAKE 1 TABLET(25 MG) BY MOUTH DAILY     Allergies:   Amlodipine   Social History   Socioeconomic History  . Marital status: Married    Spouse name: Not on file  . Number of children: Not on file  . Years of education: Not on file  . Highest education level: Not on file  Occupational History  . Not on file  Social Needs  . Financial resource strain: Not on file  . Food insecurity:    Worry: Not on file    Inability: Not on file  . Transportation needs:    Medical: Not on file    Non-medical: Not on file  Tobacco Use  . Smoking status: Former Smoker    Types: Cigarettes    Last attempt to quit: 07/21/1983    Years since quitting: 34.4  . Smokeless tobacco: Never Used  Substance and Sexual Activity  . Alcohol use: Yes    Alcohol/week: 1.2 oz    Types: 2 Cans of beer per week    Comment: daily  . Drug use: No  . Sexual activity: Not on file  Lifestyle  . Physical activity:    Days per week: Not on file    Minutes per session: Not on file  . Stress: Not on file  Relationships  . Social connections:    Talks on phone: Not on file    Gets together: Not on file    Attends religious service: Not on file    Active member of club or organization: Not on file    Attends meetings of clubs or organizations: Not on file    Relationship status: Not on file  Other Topics Concern  . Not on file  Social History Narrative  . Not on file     Family History: The patient's family history includes Cancer in his sister; Heart disease in his father; Heart failure in his father; Lung cancer in his father and mother.  ROS:   Please see the history of present illness.    ROS  All other systems reviewed and negative.   EKGs/Labs/Other Studies Reviewed:    The following studies were reviewed today: PAP download  EKG:  EKG is  ordered today.   Recent Labs: 01/27/2017: BUN 18;  Creatinine, Ser 0.92; Hemoglobin 13.7; Platelets 165; Potassium 4.3; Sodium 140   Recent Lipid Panel    Component Value Date/Time   CHOL 141 03/26/2016 1009   TRIG 70 03/26/2016 1009   HDL 63 03/26/2016 1009  CHOLHDL 2.2 03/26/2016 1009   VLDL 14 03/26/2016 1009   LDLCALC 64 03/26/2016 1009    Physical Exam:    VS:  BP (!) 104/58   Pulse (!) 54   Ht 5\' 11"  (1.803 m)   Wt 210 lb 6.4 oz (95.4 kg)   SpO2 97%   BMI 29.34 kg/m     Wt Readings from Last 3 Encounters:  12/07/17 210 lb 6.4 oz (95.4 kg)  04/20/17 210 lb 6.4 oz (95.4 kg)  02/12/17 209 lb 6.4 oz (95 kg)     GEN:  Well nourished, well developed in no acute distress HEENT: Normal NECK: No JVD; No carotid bruits LYMPHATICS: No lymphadenopathy CARDIAC: RRR, no murmurs, rubs, gallops RESPIRATORY:  Clear to auscultation without rales, wheezing or rhonchi  ABDOMEN: Soft, non-tender, non-distended MUSCULOSKELETAL:  No edema; No deformity  SKIN: Warm and dry NEUROLOGIC:  Alert and oriented x 3 PSYCHIATRIC:  Normal affect   ASSESSMENT:    1. Coronary artery disease involving native coronary artery of native heart without angina pectoris   2. Essential hypertension, benign   3. OSA (obstructive sleep apnea)   4. Pure hypercholesterolemia    PLAN:    In order of problems listed above:  1.  ASCAD -  s/p CABG and then PCI of SVG to OM1.  Repeat heart cath for unstable angina on 01/27/2017 showed severe focal in-stent restenosis in the SVG to OM stent status post balloon angioplasty.  All of the grafts were patent.  He denies any anginal symptoms.  He will continue on aspirin 81 mg daily, Plavix 75 mg daily and statin.  2.  Hypertension - BP is well controlled on exam today.  He will continue on Altace 10 mg daily, doxazosin 2 mg daily and spironolactone 25 mg daily.  I will check a bmet today.  3.  OSA - the patient is tolerating PAP therapy well without any problems. The PAP download was reviewed today and showed an  AHI of 2.7 /hr on 8 cm H2O with 97%% compliance in using more than 4 hours nightly.  The patient has been using and benefiting from PAP use and will continue to benefit from therapy.   4.  Hyperlipidemia with LDL goal less than 70.  His LDL was 62 on 04/20/2017.  He will continue on Crestor 40 mg daily and Zetia 10 mg daily.  Medication Adjustments/Labs and Tests Ordered: Current medicines are reviewed at length with the patient today.  Concerns regarding medicines are outlined above.  No orders of the defined types were placed in this encounter.  No orders of the defined types were placed in this encounter.   Signed, Fransico Him, MD  12/07/2017 9:29 AM    [place]

## 2017-12-26 ENCOUNTER — Other Ambulatory Visit: Payer: Self-pay | Admitting: Physician Assistant

## 2018-01-14 ENCOUNTER — Encounter: Payer: Self-pay | Admitting: Cardiology

## 2018-01-21 ENCOUNTER — Telehealth: Payer: Self-pay | Admitting: *Deleted

## 2018-01-21 DIAGNOSIS — G4733 Obstructive sleep apnea (adult) (pediatric): Secondary | ICD-10-CM

## 2018-01-21 NOTE — Telephone Encounter (Signed)
Travel Cpap ordered with the settings ... travel CPAP at 8cm H2O with heated humidity

## 2018-01-24 NOTE — Telephone Encounter (Signed)
Patient called to request his cpap Rx be mailed to him.Rx mailed to address listed in epic.

## 2018-02-02 ENCOUNTER — Other Ambulatory Visit: Payer: Self-pay | Admitting: Cardiology

## 2018-03-01 ENCOUNTER — Other Ambulatory Visit: Payer: Self-pay

## 2018-03-01 MED ORDER — EZETIMIBE 10 MG PO TABS: 10.0000 mg | ORAL_TABLET | Freq: Every day | ORAL | 3 refills | Status: DC

## 2018-03-01 MED ORDER — ROSUVASTATIN CALCIUM 40 MG PO TABS: 40.0000 mg | ORAL_TABLET | Freq: Every day | ORAL | 3 refills | Status: DC

## 2018-03-01 NOTE — Progress Notes (Signed)
Prescriptions mailed per request.

## 2018-03-27 DIAGNOSIS — Z23 Encounter for immunization: Secondary | ICD-10-CM | POA: Diagnosis not present

## 2018-04-27 DIAGNOSIS — E7800 Pure hypercholesterolemia, unspecified: Secondary | ICD-10-CM | POA: Diagnosis not present

## 2018-04-27 DIAGNOSIS — I10 Essential (primary) hypertension: Secondary | ICD-10-CM | POA: Diagnosis not present

## 2018-04-27 DIAGNOSIS — I2510 Atherosclerotic heart disease of native coronary artery without angina pectoris: Secondary | ICD-10-CM | POA: Diagnosis not present

## 2018-04-27 DIAGNOSIS — Z0000 Encounter for general adult medical examination without abnormal findings: Secondary | ICD-10-CM | POA: Diagnosis not present

## 2018-04-27 DIAGNOSIS — G4733 Obstructive sleep apnea (adult) (pediatric): Secondary | ICD-10-CM | POA: Diagnosis not present

## 2018-04-27 DIAGNOSIS — Z1159 Encounter for screening for other viral diseases: Secondary | ICD-10-CM | POA: Diagnosis not present

## 2018-04-27 DIAGNOSIS — Z1389 Encounter for screening for other disorder: Secondary | ICD-10-CM | POA: Diagnosis not present

## 2018-04-27 DIAGNOSIS — N401 Enlarged prostate with lower urinary tract symptoms: Secondary | ICD-10-CM | POA: Diagnosis not present

## 2018-06-03 DIAGNOSIS — H16223 Keratoconjunctivitis sicca, not specified as Sjogren's, bilateral: Secondary | ICD-10-CM | POA: Diagnosis not present

## 2018-06-09 DIAGNOSIS — F411 Generalized anxiety disorder: Secondary | ICD-10-CM | POA: Diagnosis not present

## 2018-06-09 DIAGNOSIS — F40228 Other natural environment type phobia: Secondary | ICD-10-CM | POA: Diagnosis not present

## 2018-06-09 DIAGNOSIS — F4323 Adjustment disorder with mixed anxiety and depressed mood: Secondary | ICD-10-CM | POA: Diagnosis not present

## 2018-06-20 ENCOUNTER — Other Ambulatory Visit: Payer: Self-pay | Admitting: Cardiology

## 2018-07-26 DIAGNOSIS — D1801 Hemangioma of skin and subcutaneous tissue: Secondary | ICD-10-CM | POA: Diagnosis not present

## 2018-07-26 DIAGNOSIS — L578 Other skin changes due to chronic exposure to nonionizing radiation: Secondary | ICD-10-CM | POA: Diagnosis not present

## 2018-07-26 DIAGNOSIS — L570 Actinic keratosis: Secondary | ICD-10-CM | POA: Diagnosis not present

## 2018-07-26 DIAGNOSIS — D229 Melanocytic nevi, unspecified: Secondary | ICD-10-CM | POA: Diagnosis not present

## 2018-07-26 DIAGNOSIS — L821 Other seborrheic keratosis: Secondary | ICD-10-CM | POA: Diagnosis not present

## 2018-07-26 DIAGNOSIS — L819 Disorder of pigmentation, unspecified: Secondary | ICD-10-CM | POA: Diagnosis not present

## 2018-08-17 DIAGNOSIS — L570 Actinic keratosis: Secondary | ICD-10-CM | POA: Diagnosis not present

## 2018-09-29 ENCOUNTER — Other Ambulatory Visit: Payer: Self-pay | Admitting: *Deleted

## 2018-09-29 MED ORDER — ROSUVASTATIN CALCIUM 40 MG PO TABS: 40.0000 mg | ORAL_TABLET | Freq: Every day | ORAL | 3 refills | Status: DC

## 2018-09-29 MED ORDER — EZETIMIBE 10 MG PO TABS: 10.0000 mg | ORAL_TABLET | Freq: Every day | ORAL | 3 refills | Status: DC

## 2018-10-04 DIAGNOSIS — L578 Other skin changes due to chronic exposure to nonionizing radiation: Secondary | ICD-10-CM | POA: Diagnosis not present

## 2018-11-23 DIAGNOSIS — F411 Generalized anxiety disorder: Secondary | ICD-10-CM | POA: Diagnosis not present

## 2018-11-23 DIAGNOSIS — F40228 Other natural environment type phobia: Secondary | ICD-10-CM | POA: Diagnosis not present

## 2018-11-23 DIAGNOSIS — F4323 Adjustment disorder with mixed anxiety and depressed mood: Secondary | ICD-10-CM | POA: Diagnosis not present

## 2018-12-29 NOTE — Progress Notes (Signed)
Virtual Visit via Video Note   This visit type was conducted due to national recommendations for restrictions regarding the COVID-19 Pandemic (e.g. social distancing) in an effort to limit this patient's exposure and mitigate transmission in our community.  Due to his co-morbid illnesses, this patient is at least at moderate risk for complications without adequate follow up.  This format is felt to be most appropriate for this patient at this time.  All issues noted in this document were discussed and addressed.  A limited physical exam was performed with this format.  Please refer to the patient's chart for his consent to telehealth for [facility].  Evaluation Performed:  Follow-up visit  This visit type was conducted due to national recommendations for restrictions regarding the COVID-19 Pandemic (e.g. social distancing).  This format is felt to be most appropriate for this patient at this time.  All issues noted in this document were discussed and addressed.  No physical exam was performed (except for noted visual exam findings with Video Visits).  Please refer to the patient's chart (MyChart message for video visits and phone note for telephone visits) for the patient's consent to telehealth for [facility].  Date:  12/30/2018   ID:  John Hinton, DOB 05/23/1946, MRN [id number]  Patient Location:  Home  Provider location:   [place]  PCP:  [name], MD  Cardiologist:  Fransico Him, MD Electrophysiologist:  None   Chief Complaint:  CAD, HTN, lipids and OSA  History of Present Illness:    John Hinton is a 73 y.o. male who presents via audio/video conferencing for a telehealth visit today.    John Hinton is a 73 y.o. male with a hx of ASCAD s/p CABG and then PCI of SVG to OM1.  Repeat heart cath for unstable angina on 01/27/2017 showed severe focal in-stent restenosis in the SVG to OM stent status post balloon angioplasty.  All of the grafts were patent.  He  has a history of hyperlipidemia and hypertension as well as moderate OSA with a PSG showing an AHI of 16.5/hr and is on CPAP at 8cm H2O.  He is here today for followup and is doing well.  He denies any chest pain or pressure, SOB, DOE, PND, orthopnea, LE edema, dizziness (except when playing golf in the heat and stands up too fast), palpitations or syncope. He is compliant with his meds and is tolerating meds with no SE.  He is doing well with his CPAP device and thinks that he has gotten used to it.  He tolerates the mask and feels the pressure is adequate.  Since going on CPAP he feels rested in the am and has no significant daytime sleepiness.  He denies any significant mouth or nasal dryness or nasal congestion.  He does not think that he snores.    The patient does not have symptoms concerning for COVID-19 infection (fever, chills, cough, or new shortness of breath).    Prior CV studies:   The following studies were reviewed today:  PAP compliance download  Past Medical History:  Diagnosis Date  . Arrhythmia    Post-op  . BPH (benign prostatic hypertrophy)   . Bradycardia    a. H/o asymptomatic bradycardia.  . Coronary artery disease    a. s/p CABG ~2007 with LIMA to LAD, SVG seq to OM1 and OM2, SVG to PDA and SVG to diag. b. Abnormal nuc 03/2015 - s/p DES to SVG-OM1-OM2. 01/29/17 POBA with cutting balloon to SVG-->OM  .  DCM (dilated cardiomyopathy) ([place]) 09/17/2015  . Dyslipidemia   . ED (erectile dysfunction)   . Fear of heights   . Hypertension   . Ischemic cardiomyopathy    a. EF 46% by nuc 03/2015.  Marland Kitchen OSA (obstructive sleep apnea) 07/12/2015   Moderate OSA with AHI 16.5/hr now on CPAP at 8cm H2O  . RBBB   . Shoulder, capsulitis, adhesive    Left Shoulder   Past Surgical History:  Procedure Laterality Date  . CARDIAC CATHETERIZATION N/A 04/05/2015   Procedure: Left Heart Cath and Coronary Angiography;  Surgeon: Sherren Mocha, MD;  Location: [place] [facility];  Service:  Cardiovascular;  Laterality: N/A;  . CORONARY ARTERY BYPASS GRAFT     w/LIMA to LAD, seq SVG to OM1 and OM2, SVG to PDA and SVG to Diagonal  . CORONARY BALLOON ANGIOPLASTY N/A 01/29/2017   Procedure: Coronary Balloon Angioplasty;  Surgeon: Martinique, Peter M, MD;  Location: [place] [facility];  Service: Cardiovascular;  Laterality: N/A;  . LEFT HEART CATH AND CORS/GRAFTS ANGIOGRAPHY N/A 01/29/2017   Procedure: Left Heart Cath and Cors/Grafts Angiography;  Surgeon: Martinique, Peter M, MD;  Location: [place] [facility];  Service: Cardiovascular;  Laterality: N/A;     Current Meds  Medication Sig  . ALPRAZolam (XANAX) 0.5 MG tablet Take 0.5 mg by mouth 2 (two) times daily as needed for anxiety.  Marland Kitchen aspirin 81 MG tablet Take 81 mg by mouth at bedtime.   . cholecalciferol (VITAMIN D) 1000 UNITS tablet Take 1,000 Units by mouth at bedtime.   . clopidogrel (PLAVIX) 75 MG tablet TAKE 1 TABLET(75 MG) BY MOUTH DAILY WITH BREAKFAST  . doxazosin (CARDURA) 2 MG tablet TAKE 1 TABLET(2 MG) BY MOUTH DAILY  . ezetimibe (ZETIA) 10 MG tablet Take 1 tablet (10 mg total) by mouth at bedtime.  . folic acid (FOLVITE) 782 MCG tablet Take 400 mcg by mouth daily.  . Multiple Vitamin (MULTIVITAMIN) tablet Take 1 tablet by mouth daily.  . nitroGLYCERIN (NITROSTAT) 0.4 MG SL tablet Place 1 tablet (0.4 mg total) under the tongue every 5 (five) minutes as needed for chest pain.  . ramipril (ALTACE) 10 MG capsule TAKE 2 CAPSULES(20 MG) BY MOUTH DAILY  . rosuvastatin (CRESTOR) 40 MG tablet Take 1 tablet (40 mg total) by mouth at bedtime.  . sertraline (ZOLOFT) 50 MG tablet Take 50 mg by mouth daily.  Marland Kitchen spironolactone (ALDACTONE) 25 MG tablet TAKE 1 TABLET(25 MG) BY MOUTH DAILY     Allergies:   Amlodipine   Social History   Tobacco Use  . Smoking status: Former Smoker    Types: Cigarettes    Quit date: 07/21/1983    Years since quitting: 35.4  . Smokeless tobacco: Never Used  Substance Use Topics  . Alcohol use: Yes     Alcohol/week: 2.0 standard drinks    Types: 2 Cans of beer per week    Comment: daily  . Drug use: No     Family Hx: The patient's family history includes Cancer in his sister; Heart disease in his father; Heart failure in his father; Lung cancer in his father and mother.  ROS:   Please see the history of present illness.     All other systems reviewed and are negative.   Labs/Other Tests and Data Reviewed:    Recent Labs: No results found for requested labs within last 8760 hours.   Recent Lipid Panel Lab Results  Component Value Date/Time   CHOL 121 12/07/2017 09:46  AM   TRIG 98 12/07/2017 09:46 AM   HDL 38 (L) 12/07/2017 09:46 AM   CHOLHDL 3.2 12/07/2017 09:46 AM   CHOLHDL 2.2 03/26/2016 10:09 AM   LDLCALC 63 12/07/2017 09:46 AM    Wt Readings from Last 3 Encounters:  12/30/18 211 lb (95.7 kg)  12/07/17 210 lb 6.4 oz (95.4 kg)  04/20/17 210 lb 6.4 oz (95.4 kg)     Objective:    Vital Signs:  BP (!) 145/82   Pulse 75   Ht 5\' 11"  (1.803 m)   Wt 211 lb (95.7 kg)   BMI 29.43 kg/m    CONSTITUTIONAL:  Well nourished, well developed male in no acute distress.  EYES: anicteric MOUTH: oral mucosa is pink RESPIRATORY: Normal respiratory effort, symmetric expansion CARDIOVASCULAR: No peripheral edema SKIN: No rash, lesions or ulcers MUSCULOSKELETAL: no digital cyanosis NEURO: Cranial Nerves II-XII grossly intact, moves all extremities PSYCH: Intact judgement and insight.  A&O x 3, Mood/affect appropriate   ASSESSMENT & PLAN:    1.  OSA - the patient is tolerating PAP therapy well without any problems. The PAP download was reviewed today and showed an AHI of 1.2/hr on 8 cm H2O with 90% compliance in using more than 4 hours nightly.  The patient has been using and benefiting from PAP use and will continue to benefit from therapy.   2.  Hypertension - BP is borderline controlled on exam today.  I have asked him to check daily for a week and call with the results. He  will continue on Doxazosin 2mg  daily, ramipril 10mg  daily, spiro 25mg  daily.  I will check a BMET.     3.  ASCAD - s/p CABG and then PCI of SVG to OM1.  Repeat heart cath for unstable angina on 01/27/2017 showed severe focal in-stent restenosis in the SVG to OM stent status post balloon angioplasty.  All other grafts were patent.  He has not had any anginal sx since I saw him last.  He will continue on ASA 81mg  daily, Plavix 75mg  daily and statin therapy.   4.  Hyperlipidemia - his LDL goal is < 70.  He will continue on Crestor 40mg  daily and Zetia 10mg  daily.  His last LDL was 63 a year ago.  I will repeat an FLP and ALT.   COVID-19 Education: The signs and symptoms of COVID-19 were discussed with the patient and how to seek care for testing (follow up with PCP or arrange E-visit).  The importance of social distancing was discussed today.  Patient Risk:   After full review of this patient's clinical status, I feel that they are at least moderate risk at this time.  Time:   Today, I have spent 20 minutes on telehealth medicine discussing medical problems including CAD, HTN, OSA, lipids.  We also reviewed the symptoms of COVID 19 and the ways to protect against contracting the virus with telehealth technology.  I spent an additional 5 minutes reviewing patient's chart including PAP compliance data and labs.  Medication Adjustments/Labs and Tests Ordered: Current medicines are reviewed at length with the patient today.  Concerns regarding medicines are outlined above.  Tests Ordered: No orders of the defined types were placed in this encounter.  Medication Changes: No orders of the defined types were placed in this encounter.   Disposition:  Follow up in 1 year(s)  Signed, Fransico Him, MD  12/30/2018 10:05 AM    [facility]

## 2018-12-30 ENCOUNTER — Encounter: Payer: Self-pay | Admitting: Cardiology

## 2018-12-30 ENCOUNTER — Other Ambulatory Visit: Payer: Self-pay

## 2018-12-30 ENCOUNTER — Telehealth (INDEPENDENT_AMBULATORY_CARE_PROVIDER_SITE_OTHER): Payer: Medicare Other | Admitting: Cardiology

## 2018-12-30 VITALS — BP 145/82 | HR 75 | Ht 71.0 in | Wt 211.0 lb

## 2018-12-30 DIAGNOSIS — G4733 Obstructive sleep apnea (adult) (pediatric): Secondary | ICD-10-CM

## 2018-12-30 DIAGNOSIS — E7800 Pure hypercholesterolemia, unspecified: Secondary | ICD-10-CM

## 2018-12-30 DIAGNOSIS — I10 Essential (primary) hypertension: Secondary | ICD-10-CM | POA: Diagnosis not present

## 2018-12-30 DIAGNOSIS — I2510 Atherosclerotic heart disease of native coronary artery without angina pectoris: Secondary | ICD-10-CM | POA: Diagnosis not present

## 2018-12-30 NOTE — Patient Instructions (Signed)
Medication Instructions:  Your physician recommends that you continue on your current medications as directed. Please refer to the Current Medication list given to you today.  If you need a refill on your cardiac medications before your next appointment, please call your pharmacy.   Lab work: Fasting Labs: 01/19/19  If you have labs (blood work) drawn today and your tests are completely normal, you will receive your results only by: Marland Kitchen MyChart Message (if you have MyChart) OR . A paper copy in the mail If you have any lab test that is abnormal or we need to change your treatment, we will call you to review the results.  Testing/Procedures: None  Follow-Up: At [facility], you and your health needs are our priority.  As part of our continuing mission to provide you with exceptional heart care, we have created designated Provider Care Teams.  These Care Teams include your primary Cardiologist (physician) and Advanced Practice Providers (APPs -  Physician Assistants and Nurse Practitioners) who all work together to provide you with the care you need, when you need it. You will need a follow up appointment in 1 years.  Please call our office 2 months in advance to schedule this appointment.  You may see Dr. [name] or one of the following Advanced Practice Providers on your designated Care Team:   [place], PA-C Melina Copa, PA-C . Ermalinda Barrios, PA-C

## 2019-01-16 ENCOUNTER — Other Ambulatory Visit: Payer: Self-pay | Admitting: Physician Assistant

## 2019-01-18 ENCOUNTER — Other Ambulatory Visit: Payer: Self-pay | Admitting: Physician Assistant

## 2019-01-19 ENCOUNTER — Other Ambulatory Visit: Payer: Medicare Other | Admitting: *Deleted

## 2019-01-19 ENCOUNTER — Other Ambulatory Visit: Payer: Self-pay

## 2019-01-19 DIAGNOSIS — E7800 Pure hypercholesterolemia, unspecified: Secondary | ICD-10-CM

## 2019-01-19 DIAGNOSIS — I2510 Atherosclerotic heart disease of native coronary artery without angina pectoris: Secondary | ICD-10-CM | POA: Diagnosis not present

## 2019-01-19 LAB — LIPID PANEL
Chol/HDL Ratio: 3.2 ratio (ref 0.0–5.0)
Cholesterol, Total: 120 mg/dL (ref 100–199)
HDL: 38 mg/dL — ABNORMAL LOW (ref >39)
LDL Calculated: 54 mg/dL (ref 0–99)
Triglycerides: 139 mg/dL (ref 0–149)
VLDL Cholesterol Cal: 28 mg/dL (ref 5–40)

## 2019-01-19 LAB — HEPATIC FUNCTION PANEL
ALT: 24 IU/L (ref 0–44)
AST: 27 IU/L (ref 0–40)
Albumin: 4.9 g/dL — ABNORMAL HIGH (ref 3.7–4.7)
Alkaline Phosphatase: 57 IU/L (ref 39–117)
Bilirubin Total: 0.4 mg/dL (ref 0.0–1.2)
Bilirubin, Direct: 0.16 mg/dL (ref 0.00–0.40)
Total Protein: 6.9 g/dL (ref 6.0–8.5)

## 2019-01-19 LAB — BASIC METABOLIC PANEL
BUN/Creatinine Ratio: 19 (ref 10–24)
BUN: 22 mg/dL (ref 8–27)
CO2: 22 mmol/L (ref 20–29)
Calcium: 8.9 mg/dL (ref 8.6–10.2)
Chloride: 103 mmol/L (ref 96–106)
Creatinine, Ser: 1.16 mg/dL (ref 0.76–1.27)
GFR calc Af Amer: 73 mL/min/{1.73_m2} (ref >59)
GFR calc non Af Amer: 63 mL/min/{1.73_m2} (ref >59)
Glucose: 110 mg/dL — ABNORMAL HIGH (ref 65–99)
Potassium: 4.4 mmol/L (ref 3.5–5.2)
Sodium: 140 mmol/L (ref 134–144)

## 2019-03-14 ENCOUNTER — Other Ambulatory Visit: Payer: Self-pay | Admitting: Cardiology

## 2019-03-27 DIAGNOSIS — N401 Enlarged prostate with lower urinary tract symptoms: Secondary | ICD-10-CM | POA: Diagnosis not present

## 2019-03-27 DIAGNOSIS — I2510 Atherosclerotic heart disease of native coronary artery without angina pectoris: Secondary | ICD-10-CM | POA: Diagnosis not present

## 2019-03-27 DIAGNOSIS — E7800 Pure hypercholesterolemia, unspecified: Secondary | ICD-10-CM | POA: Diagnosis not present

## 2019-03-27 DIAGNOSIS — I10 Essential (primary) hypertension: Secondary | ICD-10-CM | POA: Diagnosis not present

## 2019-04-06 DIAGNOSIS — D1801 Hemangioma of skin and subcutaneous tissue: Secondary | ICD-10-CM | POA: Diagnosis not present

## 2019-04-06 DIAGNOSIS — L570 Actinic keratosis: Secondary | ICD-10-CM | POA: Diagnosis not present

## 2019-04-06 DIAGNOSIS — L821 Other seborrheic keratosis: Secondary | ICD-10-CM | POA: Diagnosis not present

## 2019-04-06 DIAGNOSIS — I8393 Asymptomatic varicose veins of bilateral lower extremities: Secondary | ICD-10-CM | POA: Diagnosis not present

## 2019-04-06 DIAGNOSIS — D229 Melanocytic nevi, unspecified: Secondary | ICD-10-CM | POA: Diagnosis not present

## 2019-04-06 DIAGNOSIS — L814 Other melanin hyperpigmentation: Secondary | ICD-10-CM | POA: Diagnosis not present

## 2019-04-06 DIAGNOSIS — L819 Disorder of pigmentation, unspecified: Secondary | ICD-10-CM | POA: Diagnosis not present

## 2019-04-14 DIAGNOSIS — Z23 Encounter for immunization: Secondary | ICD-10-CM | POA: Diagnosis not present

## 2019-05-08 DIAGNOSIS — I2510 Atherosclerotic heart disease of native coronary artery without angina pectoris: Secondary | ICD-10-CM | POA: Diagnosis not present

## 2019-05-08 DIAGNOSIS — G4733 Obstructive sleep apnea (adult) (pediatric): Secondary | ICD-10-CM | POA: Diagnosis not present

## 2019-05-08 DIAGNOSIS — R739 Hyperglycemia, unspecified: Secondary | ICD-10-CM | POA: Diagnosis not present

## 2019-05-08 DIAGNOSIS — Z1389 Encounter for screening for other disorder: Secondary | ICD-10-CM | POA: Diagnosis not present

## 2019-05-08 DIAGNOSIS — N401 Enlarged prostate with lower urinary tract symptoms: Secondary | ICD-10-CM | POA: Diagnosis not present

## 2019-05-08 DIAGNOSIS — R413 Other amnesia: Secondary | ICD-10-CM | POA: Diagnosis not present

## 2019-05-08 DIAGNOSIS — I10 Essential (primary) hypertension: Secondary | ICD-10-CM | POA: Diagnosis not present

## 2019-05-08 DIAGNOSIS — E7800 Pure hypercholesterolemia, unspecified: Secondary | ICD-10-CM | POA: Diagnosis not present

## 2019-05-08 DIAGNOSIS — Z0000 Encounter for general adult medical examination without abnormal findings: Secondary | ICD-10-CM | POA: Diagnosis not present

## 2019-05-08 DIAGNOSIS — Z1159 Encounter for screening for other viral diseases: Secondary | ICD-10-CM | POA: Diagnosis not present

## 2019-05-25 DIAGNOSIS — F40228 Other natural environment type phobia: Secondary | ICD-10-CM | POA: Diagnosis not present

## 2019-05-25 DIAGNOSIS — F411 Generalized anxiety disorder: Secondary | ICD-10-CM | POA: Diagnosis not present

## 2019-05-25 DIAGNOSIS — F4323 Adjustment disorder with mixed anxiety and depressed mood: Secondary | ICD-10-CM | POA: Diagnosis not present

## 2019-06-28 ENCOUNTER — Other Ambulatory Visit: Payer: Self-pay | Admitting: Cardiology

## 2019-09-08 DIAGNOSIS — N401 Enlarged prostate with lower urinary tract symptoms: Secondary | ICD-10-CM | POA: Diagnosis not present

## 2019-09-08 DIAGNOSIS — I10 Essential (primary) hypertension: Secondary | ICD-10-CM | POA: Diagnosis not present

## 2019-09-08 DIAGNOSIS — I2510 Atherosclerotic heart disease of native coronary artery without angina pectoris: Secondary | ICD-10-CM | POA: Diagnosis not present

## 2019-09-08 DIAGNOSIS — E7800 Pure hypercholesterolemia, unspecified: Secondary | ICD-10-CM | POA: Diagnosis not present

## 2019-09-30 ENCOUNTER — Ambulatory Visit: Payer: Medicare Other | Attending: Internal Medicine

## 2019-09-30 DIAGNOSIS — Z23 Encounter for immunization: Secondary | ICD-10-CM

## 2019-09-30 NOTE — Progress Notes (Signed)
   Covid-19 Vaccination Clinic  Name:  John Hinton    MRN: [id number] DOB: [birth year]  09/30/2019  [name] was observed post Covid-19 immunization for 15 minutes without incident. He was provided with Vaccine Information Sheet and instruction to access the V-Safe system.   [name] was instructed to call 911 with any severe reactions post vaccine: Marland Kitchen Difficulty breathing  . Swelling of face and throat  . A fast heartbeat  . A bad rash all over body  . Dizziness and weakness   Immunizations Administered    Name Date Dose VIS Date Route   Pfizer COVID-19 Vaccine 09/30/2019  1:16 PM 0.3 mL 06/30/2019 Intramuscular   Manufacturer: [place]   Lot: [id number]   [place]: [id number]

## 2019-10-04 DIAGNOSIS — L570 Actinic keratosis: Secondary | ICD-10-CM | POA: Diagnosis not present

## 2019-10-04 DIAGNOSIS — L821 Other seborrheic keratosis: Secondary | ICD-10-CM | POA: Diagnosis not present

## 2019-10-04 DIAGNOSIS — L814 Other melanin hyperpigmentation: Secondary | ICD-10-CM | POA: Diagnosis not present

## 2019-10-22 ENCOUNTER — Other Ambulatory Visit: Payer: Self-pay | Admitting: Cardiology

## 2019-10-24 ENCOUNTER — Ambulatory Visit: Payer: Medicare Other | Attending: Internal Medicine

## 2019-10-24 DIAGNOSIS — Z23 Encounter for immunization: Secondary | ICD-10-CM

## 2019-10-24 NOTE — Progress Notes (Signed)
   Covid-19 Vaccination Clinic  Name:  Sanjay Broadfoot    MRN: [id number] DOB: 1945/12/13  10/24/2019  Mr. Napierala was observed post Covid-19 immunization for 15 minutes without incident. He was provided with Vaccine Information Sheet and instruction to access the V-Safe system.   Mr. Downs was instructed to call 911 with any severe reactions post vaccine: Marland Kitchen Difficulty breathing  . Swelling of face and throat  . A fast heartbeat  . A bad rash all over body  . Dizziness and weakness   Immunizations Administered    Name Date Dose VIS Date Route   Pfizer COVID-19 Vaccine 10/24/2019 12:40 PM 0.3 mL 06/30/2019 Intramuscular   Manufacturer: [place]   Lot: [id number]   [place]: [id number]

## 2019-11-22 DIAGNOSIS — F411 Generalized anxiety disorder: Secondary | ICD-10-CM | POA: Diagnosis not present

## 2019-11-22 DIAGNOSIS — F4323 Adjustment disorder with mixed anxiety and depressed mood: Secondary | ICD-10-CM | POA: Diagnosis not present

## 2019-11-22 DIAGNOSIS — F40228 Other natural environment type phobia: Secondary | ICD-10-CM | POA: Diagnosis not present

## 2019-11-28 ENCOUNTER — Other Ambulatory Visit: Payer: Self-pay | Admitting: Cardiology

## 2019-12-26 ENCOUNTER — Other Ambulatory Visit: Payer: Self-pay | Admitting: Cardiology

## 2020-01-31 NOTE — Progress Notes (Addendum)
Office Visit  Note   Date:  02/05/2020   ID:  John Hinton, DOB November 01, 1945, MRN [id number]   PCP:  [name], MD  Cardiologist:  Fransico Him, MD Electrophysiologist:  None   Chief Complaint:  CAD, HTN, lipids and OSA  History of Present Illness:    John Hinton is a 74 y.o. male with a hx of ASCAD s/p CABG and then PCI of SVG to OM1.  Repeat heart cath for unstable angina on 01/27/2017 showed severe focal in-stent restenosis in the SVG to OM stent status post balloon angioplasty.  All of the grafts were patent.  He has a history of hyperlipidemia and hypertension as well as moderate OSA with a PSG showing an AHI of 16.5/hr and is on CPAP at 8cm H2O.  He is here today for followup and is doing well.  He tells me that over the past year he has had some DOE/SOB that has progressed over the year.  He notices it when he is playing golf out in the heat as well as when doing other activities.  He smoked about 30-38yrs ago. He denies any chest pain or pressure, PND, orthopnea, LE edema, dizziness (except when bending over and standing up too fast), palpitations or syncope. He is compliant with his meds and is tolerating meds with no SE.    He is doing well with his CPAP device and thinks that he has gotten used to it.  He tolerates the mask and feels the pressure is adequate.  Since going on CPAP he feels rested in the am and has no significant daytime sleepiness.  He denies any significant mouth or nasal dryness or nasal congestion.  He does not think that he snores.    Prior CV studies:   The following studies were reviewed today:  PAP compliance download  Past Medical History:  Diagnosis Date   Arrhythmia    Post-op   BPH (benign prostatic hypertrophy)    Bradycardia    a. H/o asymptomatic bradycardia.   Coronary artery disease    a. s/p CABG ~2007 with LIMA to LAD, SVG seq to OM1 and OM2, SVG to PDA and SVG to diag. b. Abnormal nuc 03/2015 - s/p DES to SVG-OM1-OM2.  01/29/17 POBA with cutting balloon to SVG-->OM   DCM (dilated cardiomyopathy) ([place]) 09/17/2015   Dyslipidemia    ED (erectile dysfunction)    Fear of heights    Hypertension    Ischemic cardiomyopathy    a. EF 46% by nuc 03/2015.   OSA (obstructive sleep apnea) 07/12/2015   Moderate OSA with AHI 16.5/hr now on CPAP at 8cm H2O   RBBB    Shoulder, capsulitis, adhesive    Left Shoulder   Past Surgical History:  Procedure Laterality Date   CARDIAC CATHETERIZATION N/A 04/05/2015   Procedure: Left Heart Cath and Coronary Angiography;  Surgeon: Sherren Mocha, MD;  Location: [place] [facility];  Service: Cardiovascular;  Laterality: N/A;   CORONARY ARTERY BYPASS GRAFT     w/LIMA to LAD, seq SVG to OM1 and OM2, SVG to PDA and SVG to Diagonal   CORONARY BALLOON ANGIOPLASTY N/A 01/29/2017   Procedure: Coronary Balloon Angioplasty;  Surgeon: Martinique, Peter M, MD;  Location: [place] [facility];  Service: Cardiovascular;  Laterality: N/A;   LEFT HEART CATH AND CORS/GRAFTS ANGIOGRAPHY N/A 01/29/2017   Procedure: Left Heart Cath and Cors/Grafts Angiography;  Surgeon: Martinique, Peter M, MD;  Location: [place] [facility];  Service: Cardiovascular;  Laterality:  N/A;     Current Meds  Medication Sig   ALPRAZolam (XANAX) 0.5 MG tablet Take 0.5 mg by mouth 2 (two) times daily as needed for anxiety.   aspirin 81 MG tablet Take 81 mg by mouth at bedtime.    cholecalciferol (VITAMIN D) 1000 UNITS tablet Take 1,000 Units by mouth at bedtime.    clopidogrel (PLAVIX) 75 MG tablet TAKE ONE TABLET BY MOUTH EVERY MORNING WITH BREAKFAST   Cyanocobalamin (B-12 PO) Take 1 tablet by mouth daily.   doxazosin (CARDURA) 2 MG tablet Take 1 tablet (2 mg total) by mouth daily. Please make yearly appt with Dr. [name] for June for future refills. 1st attempt   ezetimibe (ZETIA) 10 MG tablet TAKE ONE TABLET BY MOUTH EVERY NIGHT AT BEDTIME   folic acid (FOLVITE) 350 MCG tablet Take 400 mcg by mouth daily.    Multiple Vitamin (MULTIVITAMIN) tablet Take 1 tablet by mouth daily.   nitroGLYCERIN (NITROSTAT) 0.4 MG SL tablet Place 1 tablet (0.4 mg total) under the tongue every 5 (five) minutes as needed for chest pain.   ramipril (ALTACE) 10 MG capsule TAKE TWO CAPSULES BY MOUTH DAILY   rosuvastatin (CRESTOR) 40 MG tablet TAKE ONE TABLET BY MOUTH EVERY NIGHT AT BEDTIME   sertraline (ZOLOFT) 50 MG tablet Take 50 mg by mouth daily.   spironolactone (ALDACTONE) 25 MG tablet TAKE ONE TABLET BY MOUTH DAILY     Allergies:   Amlodipine   Social History   Tobacco Use   Smoking status: Former Smoker    Types: Cigarettes    Quit date: 07/21/1983    Years since quitting: 36.5   Smokeless tobacco: Never Used  Substance Use Topics   Alcohol use: Yes    Alcohol/week: 2.0 standard drinks    Types: 2 Cans of beer per week    Comment: daily   Drug use: No     Family Hx: The patient's family history includes Cancer in his sister; Heart disease in his father; Heart failure in his father; Lung cancer in his father and mother.  ROS:   Please see the history of present illness.     All other systems reviewed and are negative.   Labs/Other Tests and Data Reviewed:    Recent Labs: No results found for requested labs within last 8760 hours.   Recent Lipid Panel Lab Results  Component Value Date/Time   CHOL 120 01/19/2019 09:57 AM   TRIG 139 01/19/2019 09:57 AM   HDL 38 (L) 01/19/2019 09:57 AM   CHOLHDL 3.2 01/19/2019 09:57 AM   CHOLHDL 2.2 03/26/2016 10:09 AM   LDLCALC 54 01/19/2019 09:57 AM    Wt Readings from Last 3 Encounters:  02/05/20 205 lb (93 kg)  12/30/18 211 lb (95.7 kg)  12/07/17 210 lb 6.4 oz (95.4 kg)     EKG today showed sinus brady with PACs and RBBB  Objective:    Vital Signs:  BP 120/82    Pulse (!) 59    Ht 5\' 11"  (1.803 m)    Wt 205 lb (93 kg)    BMI 28.59 kg/m    GEN: Well nourished, well developed in no acute distress HEENT: Normal NECK: No JVD; No carotid  bruits LYMPHATICS: No lymphadenopathy CARDIAC:RRR, no murmurs, rubs, gallops RESPIRATORY:  Clear to auscultation without rales, wheezing or rhonchi  ABDOMEN: Soft, non-tender, non-distended MUSCULOSKELETAL:  No edema; No deformity  SKIN: Warm and dry NEUROLOGIC:  Alert and oriented x 3 PSYCHIATRIC:  Normal affect  ASSESSMENT & PLAN:    1. OSA - The patient is tolerating PAP therapy well without any problems. The PAP download was reviewed today and showed an AHI of 2.5/hr on 8 cm H2O with 90% compliance in using more than 4 hours nightly.  The patient has been using and benefiting from PAP use and will continue to benefit from therapy.   2.  Hypertension -BP controlled on exam -continue on Doxazosin 2mg  daily, ramipril 20mg  daily, spiro 25mg  daily.   -check BMET  3.  ASCAD  - s/p CABG and then PCI of SVG to OM1.   -Repeat heart cath for unstable angina on 01/27/2017 showed severe focal in-stent restenosis in the SVG to OM stent status post balloon angioplasty.  All other grafts were patent.   -he denies any anginal sx -continue ASA 81mg  daily, Plavix 75mg  daily and statin  4.  Hyperlipidemia  - his LDL goal is < 70.   -LDL 54 a year ago -repeat FLP and ALT -continue Crestor 40mg  daily and Zetia 10mg  daily  5.  DOE -seems to have occurred over the past year and has progressed recently.  He notices this mainly when playing golf in the heat and other exertional activities.  He went skiing this winter and would have to stop for breaks to breathe. -he has a remote hx of tobacco use -I will get a 2D echo to assess LVF -check Lexiscan myoview to rule out ischemia -check PFTs with DLCO   Medication Adjustments/Labs and Tests Ordered: Current medicines are reviewed at length with the patient today.  Concerns regarding medicines are outlined above.  Tests Ordered: Orders Placed This Encounter  Procedures   EKG 12-Lead   Medication Changes: No orders of the defined types were  placed in this encounter.   Disposition:  Follow up in 1 year(s)  Signed, Fransico Him, MD  02/05/2020 1:54 PM    [facility]

## 2020-02-05 ENCOUNTER — Encounter: Payer: Self-pay | Admitting: Cardiology

## 2020-02-05 ENCOUNTER — Other Ambulatory Visit: Payer: Self-pay

## 2020-02-05 ENCOUNTER — Ambulatory Visit (INDEPENDENT_AMBULATORY_CARE_PROVIDER_SITE_OTHER): Payer: Medicare Other | Admitting: Cardiology

## 2020-02-05 VITALS — BP 120/82 | HR 59 | Ht 71.0 in | Wt 205.0 lb

## 2020-02-05 DIAGNOSIS — I2510 Atherosclerotic heart disease of native coronary artery without angina pectoris: Secondary | ICD-10-CM | POA: Diagnosis not present

## 2020-02-05 DIAGNOSIS — E7800 Pure hypercholesterolemia, unspecified: Secondary | ICD-10-CM | POA: Diagnosis not present

## 2020-02-05 DIAGNOSIS — R0609 Other forms of dyspnea: Secondary | ICD-10-CM

## 2020-02-05 DIAGNOSIS — I10 Essential (primary) hypertension: Secondary | ICD-10-CM

## 2020-02-05 DIAGNOSIS — R0600 Dyspnea, unspecified: Secondary | ICD-10-CM

## 2020-02-05 DIAGNOSIS — G4733 Obstructive sleep apnea (adult) (pediatric): Secondary | ICD-10-CM

## 2020-02-05 NOTE — Patient Instructions (Signed)
Medication Instructions:  Your physician recommends that you continue on your current medications as directed. Please refer to the Current Medication list given to you today.  *If you need a refill on your cardiac medications before your next appointment, please call your pharmacy*  Labs: TODAY: CMET, FLP  Testing/Procedures: Your physician has requested that you have an echocardiogram. Echocardiography is a painless test that uses sound waves to create images of your heart. It provides your doctor with information about the size and shape of your heart and how well your heart's chambers and valves are working. This procedure takes approximately one hour. There are no restrictions for this procedure.  Your physician has requested that you have a lexiscan myoview. For further information please visit [email or web address]. Please follow instruction sheet, as given.  Your physician has recommended that you have a pulmonary function test. Pulmonary Function Tests are a group of tests that measure how well air moves in and out of your lungs.  Follow-Up: At [facility], you and your health needs are our priority.  As part of our continuing mission to provide you with exceptional heart care, we have created designated Provider Care Teams.  These Care Teams include your primary Cardiologist (physician) and Advanced Practice Providers (APPs -  Physician Assistants and Nurse Practitioners) who all work together to provide you with the care you need, when you need it.  We recommend signing up for the patient portal called "MyChart".  Sign up information is provided on this After Visit Summary.  MyChart is used to connect with   Your next appointment:   1 year(s)  The format for your next appointment:   In Person  Provider:   You may see Fransico Him, MD or one of the following Advanced Practice Providers on your designated Care Team:    Melina Copa, PA-C  Ermalinda Barrios, PA-C

## 2020-02-05 NOTE — Addendum Note (Signed)
Addended by: Antonieta Iba on: 02/05/2020 02:04 PM   Modules accepted: Orders

## 2020-02-06 LAB — COMPREHENSIVE METABOLIC PANEL
ALT: 22 IU/L (ref 0–44)
AST: 24 IU/L (ref 0–40)
Albumin/Globulin Ratio: 2.3 — ABNORMAL HIGH (ref 1.2–2.2)
Albumin: 5.1 g/dL — ABNORMAL HIGH (ref 3.7–4.7)
Alkaline Phosphatase: 57 IU/L (ref 48–121)
BUN/Creatinine Ratio: 21 (ref 10–24)
BUN: 21 mg/dL (ref 8–27)
Bilirubin Total: 0.4 mg/dL (ref 0.0–1.2)
CO2: 25 mmol/L (ref 20–29)
Calcium: 9.5 mg/dL (ref 8.6–10.2)
Chloride: 103 mmol/L (ref 96–106)
Creatinine, Ser: 1 mg/dL (ref 0.76–1.27)
GFR calc Af Amer: 87 mL/min/{1.73_m2} (ref >59)
GFR calc non Af Amer: 75 mL/min/{1.73_m2} (ref >59)
Globulin, Total: 2.2 g/dL (ref 1.5–4.5)
Glucose: 95 mg/dL (ref 65–99)
Potassium: 4.1 mmol/L (ref 3.5–5.2)
Sodium: 141 mmol/L (ref 134–144)
Total Protein: 7.3 g/dL (ref 6.0–8.5)

## 2020-02-06 LAB — LIPID PANEL
Chol/HDL Ratio: 3.1 ratio (ref 0.0–5.0)
Cholesterol, Total: 122 mg/dL (ref 100–199)
HDL: 40 mg/dL (ref >39)
LDL Chol Calc (NIH): 67 mg/dL (ref 0–99)
Triglycerides: 75 mg/dL (ref 0–149)
VLDL Cholesterol Cal: 15 mg/dL (ref 5–40)

## 2020-02-23 ENCOUNTER — Telehealth (HOSPITAL_COMMUNITY): Payer: Self-pay | Admitting: *Deleted

## 2020-02-23 ENCOUNTER — Telehealth (HOSPITAL_COMMUNITY): Payer: Self-pay

## 2020-02-23 NOTE — Telephone Encounter (Signed)
Patient given detailed instructions per Myocardial Perfusion Study Information Sheet for the test on 02/28/20 at 1015. Patient notified to arrive 15 minutes early and that it is imperative to arrive on time for appointment to keep from having the test rescheduled.  If you need to cancel or reschedule your appointment, please call the office within 24 hours of your appointment. . Patient verbalized understanding. TMY

## 2020-02-23 NOTE — Telephone Encounter (Signed)
Left message on voicemail in reference to upcoming appointment scheduled for 02/28/20. Phone number given for a call back so details instructions can be given. John Hinton

## 2020-02-28 ENCOUNTER — Ambulatory Visit (HOSPITAL_COMMUNITY): Payer: Medicare Other | Attending: Cardiovascular Disease

## 2020-02-28 ENCOUNTER — Other Ambulatory Visit: Payer: Self-pay

## 2020-02-28 ENCOUNTER — Ambulatory Visit (HOSPITAL_BASED_OUTPATIENT_CLINIC_OR_DEPARTMENT_OTHER): Payer: Medicare Other

## 2020-02-28 DIAGNOSIS — R0609 Other forms of dyspnea: Secondary | ICD-10-CM

## 2020-02-28 DIAGNOSIS — R0600 Dyspnea, unspecified: Secondary | ICD-10-CM | POA: Insufficient documentation

## 2020-02-28 LAB — MYOCARDIAL PERFUSION IMAGING
LV dias vol: 154 mL (ref 62–150)
LV sys vol: 84 mL
Peak HR: 75 {beats}/min
Rest HR: 56 {beats}/min
SDS: 3
SRS: 2
SSS: 5
TID: 1.06

## 2020-02-28 LAB — ECHOCARDIOGRAM COMPLETE
Area-P 1/2: 1.92 cm2
Height: 71 in
S' Lateral: 4.2 cm
Weight: 3280 oz

## 2020-02-28 MED ORDER — TECHNETIUM TC 99M TETROFOSMIN IV KIT
32.1000 | PACK | Freq: Once | INTRAVENOUS | Status: AC | PRN
  Administered 2020-02-28: 32.1 via INTRAVENOUS
  Filled 2020-02-28: qty 33

## 2020-02-28 MED ORDER — REGADENOSON 0.4 MG/5ML IV SOLN
0.4000 mg | Freq: Once | INTRAVENOUS | Status: AC
  Administered 2020-02-28: 0.4 mg via INTRAVENOUS

## 2020-02-28 MED ORDER — TECHNETIUM TC 99M TETROFOSMIN IV KIT
9.5000 | PACK | Freq: Once | INTRAVENOUS | Status: AC | PRN
  Administered 2020-02-28: 9.5 via INTRAVENOUS
  Filled 2020-02-28: qty 10

## 2020-02-29 ENCOUNTER — Encounter (HOSPITAL_COMMUNITY): Payer: Medicare Other

## 2020-02-29 ENCOUNTER — Telehealth: Payer: Self-pay | Admitting: Cardiology

## 2020-02-29 ENCOUNTER — Other Ambulatory Visit (HOSPITAL_COMMUNITY): Payer: Medicare Other

## 2020-02-29 ENCOUNTER — Ambulatory Visit (INDEPENDENT_AMBULATORY_CARE_PROVIDER_SITE_OTHER): Payer: Medicare Other | Admitting: Internal Medicine

## 2020-02-29 ENCOUNTER — Telehealth: Payer: Self-pay

## 2020-02-29 DIAGNOSIS — R0609 Other forms of dyspnea: Secondary | ICD-10-CM

## 2020-02-29 DIAGNOSIS — I420 Dilated cardiomyopathy: Secondary | ICD-10-CM

## 2020-02-29 DIAGNOSIS — R0600 Dyspnea, unspecified: Secondary | ICD-10-CM | POA: Diagnosis not present

## 2020-02-29 DIAGNOSIS — I77810 Thoracic aortic ectasia: Secondary | ICD-10-CM

## 2020-02-29 LAB — PULMONARY FUNCTION TEST
DL/VA % pred: 113 %
DL/VA: 4.5 ml/min/mmHg/L
DLCO cor % pred: 106 %
DLCO cor: 29 ml/min/mmHg
DLCO unc % pred: 106 %
DLCO unc: 29 ml/min/mmHg
FEF 25-75 Post: 2.64 L/sec
FEF 25-75 Pre: 2.37 L/sec
FEF2575-%Change-Post: 11 %
FEF2575-%Pred-Post: 103 %
FEF2575-%Pred-Pre: 92 %
FEV1-%Change-Post: 3 %
FEV1-%Pred-Post: 98 %
FEV1-%Pred-Pre: 94 %
FEV1-Post: 3.39 L
FEV1-Pre: 3.26 L
FEV1FVC-%Change-Post: 3 %
FEV1FVC-%Pred-Pre: 99 %
FEV6-%Change-Post: 1 %
FEV6-%Pred-Post: 101 %
FEV6-%Pred-Pre: 100 %
FEV6-Post: 4.52 L
FEV6-Pre: 4.46 L
FEV6FVC-%Change-Post: 0 %
FEV6FVC-%Pred-Post: 105 %
FEV6FVC-%Pred-Pre: 104 %
FVC-%Change-Post: 0 %
FVC-%Pred-Post: 96 %
FVC-%Pred-Pre: 95 %
FVC-Post: 4.54 L
FVC-Pre: 4.5 L
Post FEV1/FVC ratio: 75 %
Post FEV6/FVC ratio: 100 %
Pre FEV1/FVC ratio: 73 %
Pre FEV6/FVC Ratio: 99 %
RV % pred: 102 %
RV: 2.65 L
TLC % pred: 95 %
TLC: 7.1 L

## 2020-02-29 NOTE — Telephone Encounter (Signed)
-----   Message from Sueanne Margarita, MD sent at 02/28/2020  6:11 PM EDT ----- No ischemia - LVF mildly reduced on stress test but is normal on echo.  Please refer to Pulmonary for DOE

## 2020-02-29 NOTE — Telephone Encounter (Signed)
    Pt is returning call from [place] to get echo result

## 2020-02-29 NOTE — Progress Notes (Signed)
Full PFT performed today. °

## 2020-02-29 NOTE — Telephone Encounter (Signed)
The patient has been notified of the result and verbalized understanding.  All questions (if any) were answered. Antonieta Iba, RN 02/29/2020 4:08 PM  Referral placed for pulmonary

## 2020-02-29 NOTE — Telephone Encounter (Signed)
Sueanne Margarita, MD  [name], MD; Antonieta Iba, RN "Echo showed normal LVF with mildly thickened heart muscle and increased stiffness of heart muscle, mildly leaky MV, mildly thickened AV and mildly dilated ascending aorta at 14mm - repeat limited echo in 1 year for dilated aorta"    The patient has been notified of the result and verbalized understanding.  All questions (if any) were answered. Antonieta Iba, RN 02/29/2020 4:06 PM

## 2020-03-06 DIAGNOSIS — H16223 Keratoconjunctivitis sicca, not specified as Sjogren's, bilateral: Secondary | ICD-10-CM | POA: Diagnosis not present

## 2020-03-13 ENCOUNTER — Other Ambulatory Visit: Payer: Self-pay | Admitting: Cardiology

## 2020-03-25 ENCOUNTER — Other Ambulatory Visit: Payer: Self-pay | Admitting: Cardiology

## 2020-04-08 DIAGNOSIS — L819 Disorder of pigmentation, unspecified: Secondary | ICD-10-CM | POA: Diagnosis not present

## 2020-04-08 DIAGNOSIS — D229 Melanocytic nevi, unspecified: Secondary | ICD-10-CM | POA: Diagnosis not present

## 2020-04-08 DIAGNOSIS — L570 Actinic keratosis: Secondary | ICD-10-CM | POA: Diagnosis not present

## 2020-04-08 DIAGNOSIS — L814 Other melanin hyperpigmentation: Secondary | ICD-10-CM | POA: Diagnosis not present

## 2020-04-08 DIAGNOSIS — L821 Other seborrheic keratosis: Secondary | ICD-10-CM | POA: Diagnosis not present

## 2020-04-08 DIAGNOSIS — D1801 Hemangioma of skin and subcutaneous tissue: Secondary | ICD-10-CM | POA: Diagnosis not present

## 2020-04-16 ENCOUNTER — Encounter: Payer: Self-pay | Admitting: Pulmonary Disease

## 2020-04-16 ENCOUNTER — Ambulatory Visit (INDEPENDENT_AMBULATORY_CARE_PROVIDER_SITE_OTHER): Payer: Medicare Other | Admitting: Pulmonary Disease

## 2020-04-16 ENCOUNTER — Other Ambulatory Visit: Payer: Self-pay

## 2020-04-16 ENCOUNTER — Ambulatory Visit (INDEPENDENT_AMBULATORY_CARE_PROVIDER_SITE_OTHER): Payer: Medicare Other

## 2020-04-16 VITALS — BP 112/70 | HR 60 | Temp 98.3°F | Ht 71.0 in | Wt 210.0 lb

## 2020-04-16 DIAGNOSIS — R0600 Dyspnea, unspecified: Secondary | ICD-10-CM

## 2020-04-16 DIAGNOSIS — J90 Pleural effusion, not elsewhere classified: Secondary | ICD-10-CM | POA: Diagnosis not present

## 2020-04-16 DIAGNOSIS — R0609 Other forms of dyspnea: Secondary | ICD-10-CM

## 2020-04-16 DIAGNOSIS — R0602 Shortness of breath: Secondary | ICD-10-CM | POA: Diagnosis not present

## 2020-04-16 IMAGING — DX DG CHEST 2V
2 series · 2 of 2 positions shown · non-contrast
Comparison: [DATE]

CLINICAL DATA: Shortness of breath

EXAM:
CHEST - 2 VIEW

[chest pa]
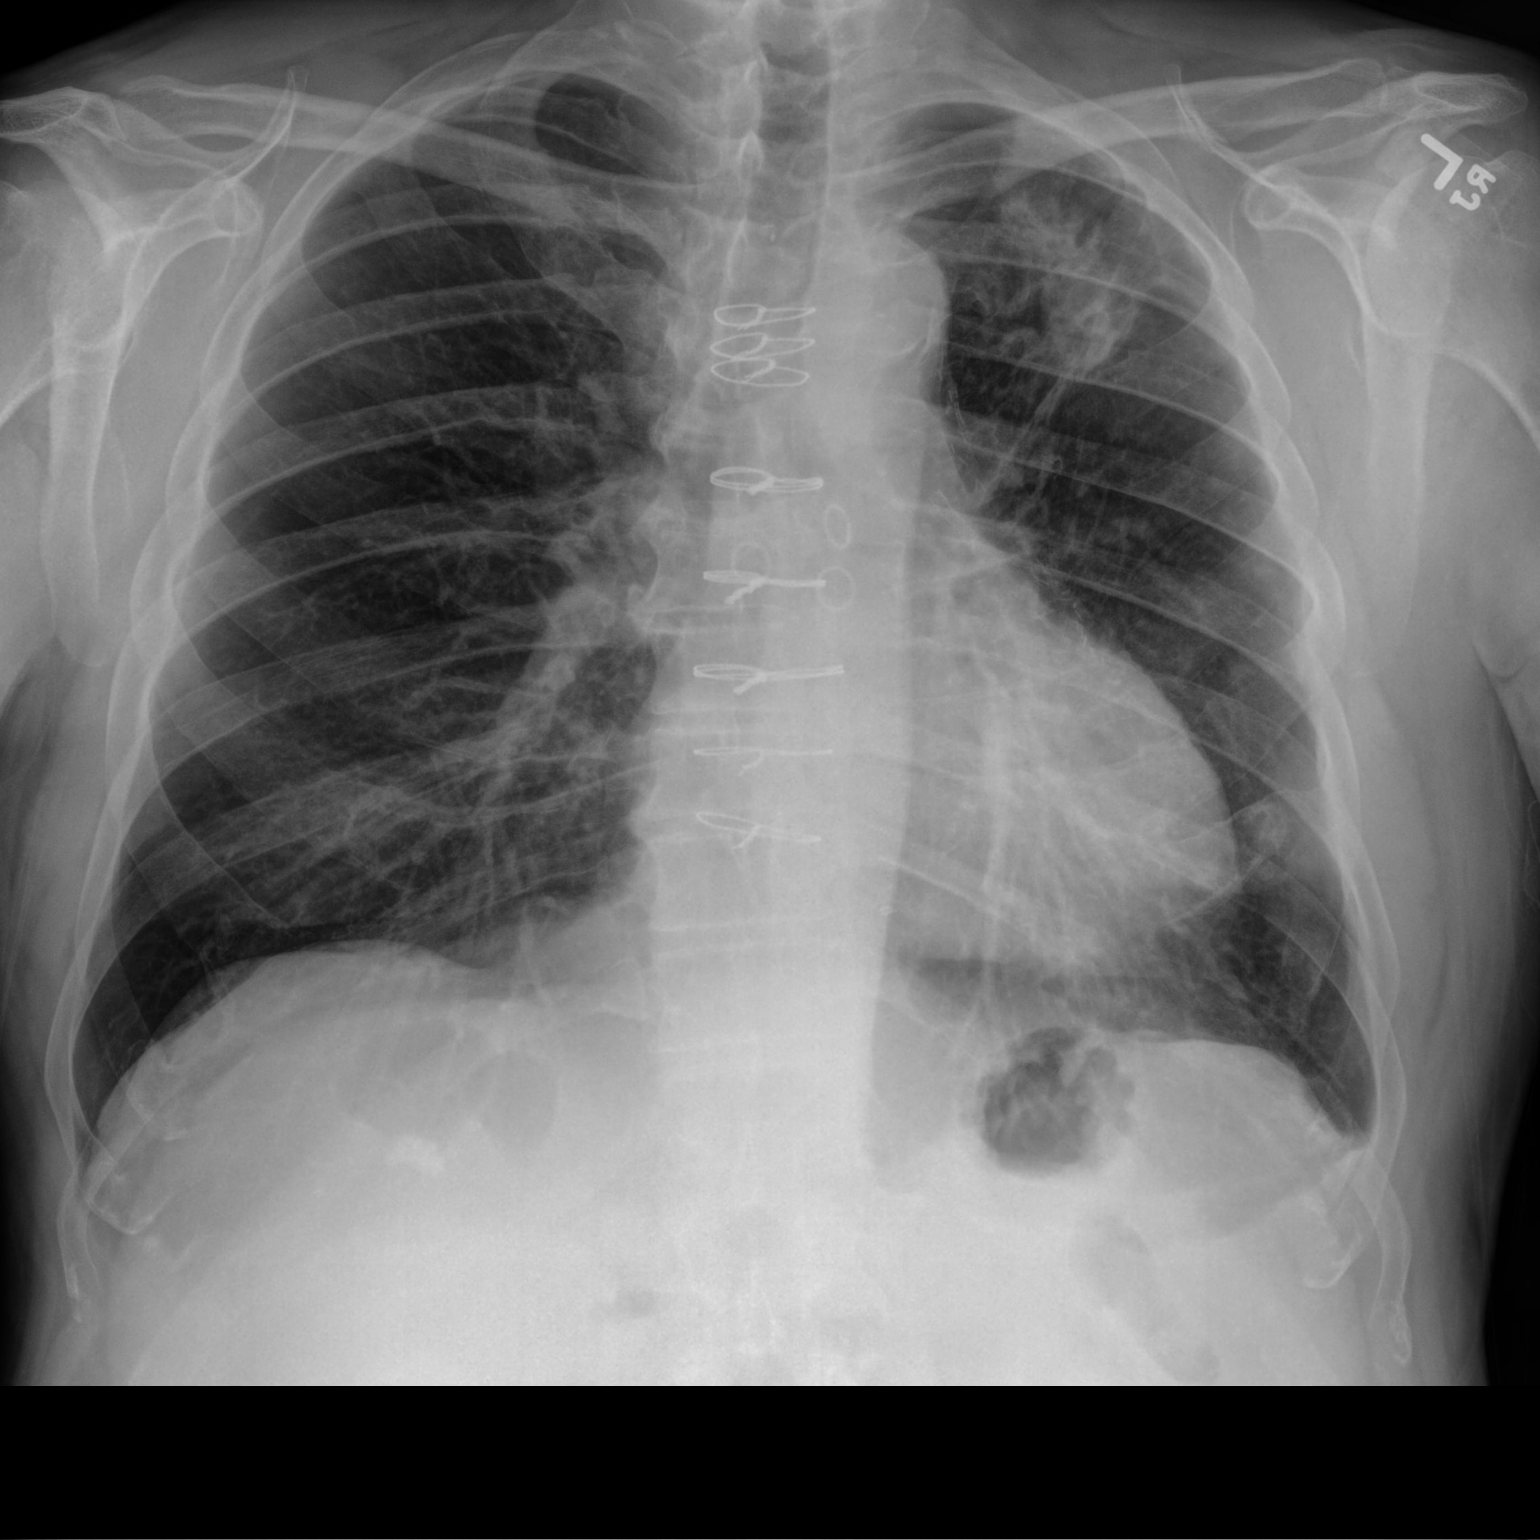

[chest lat]
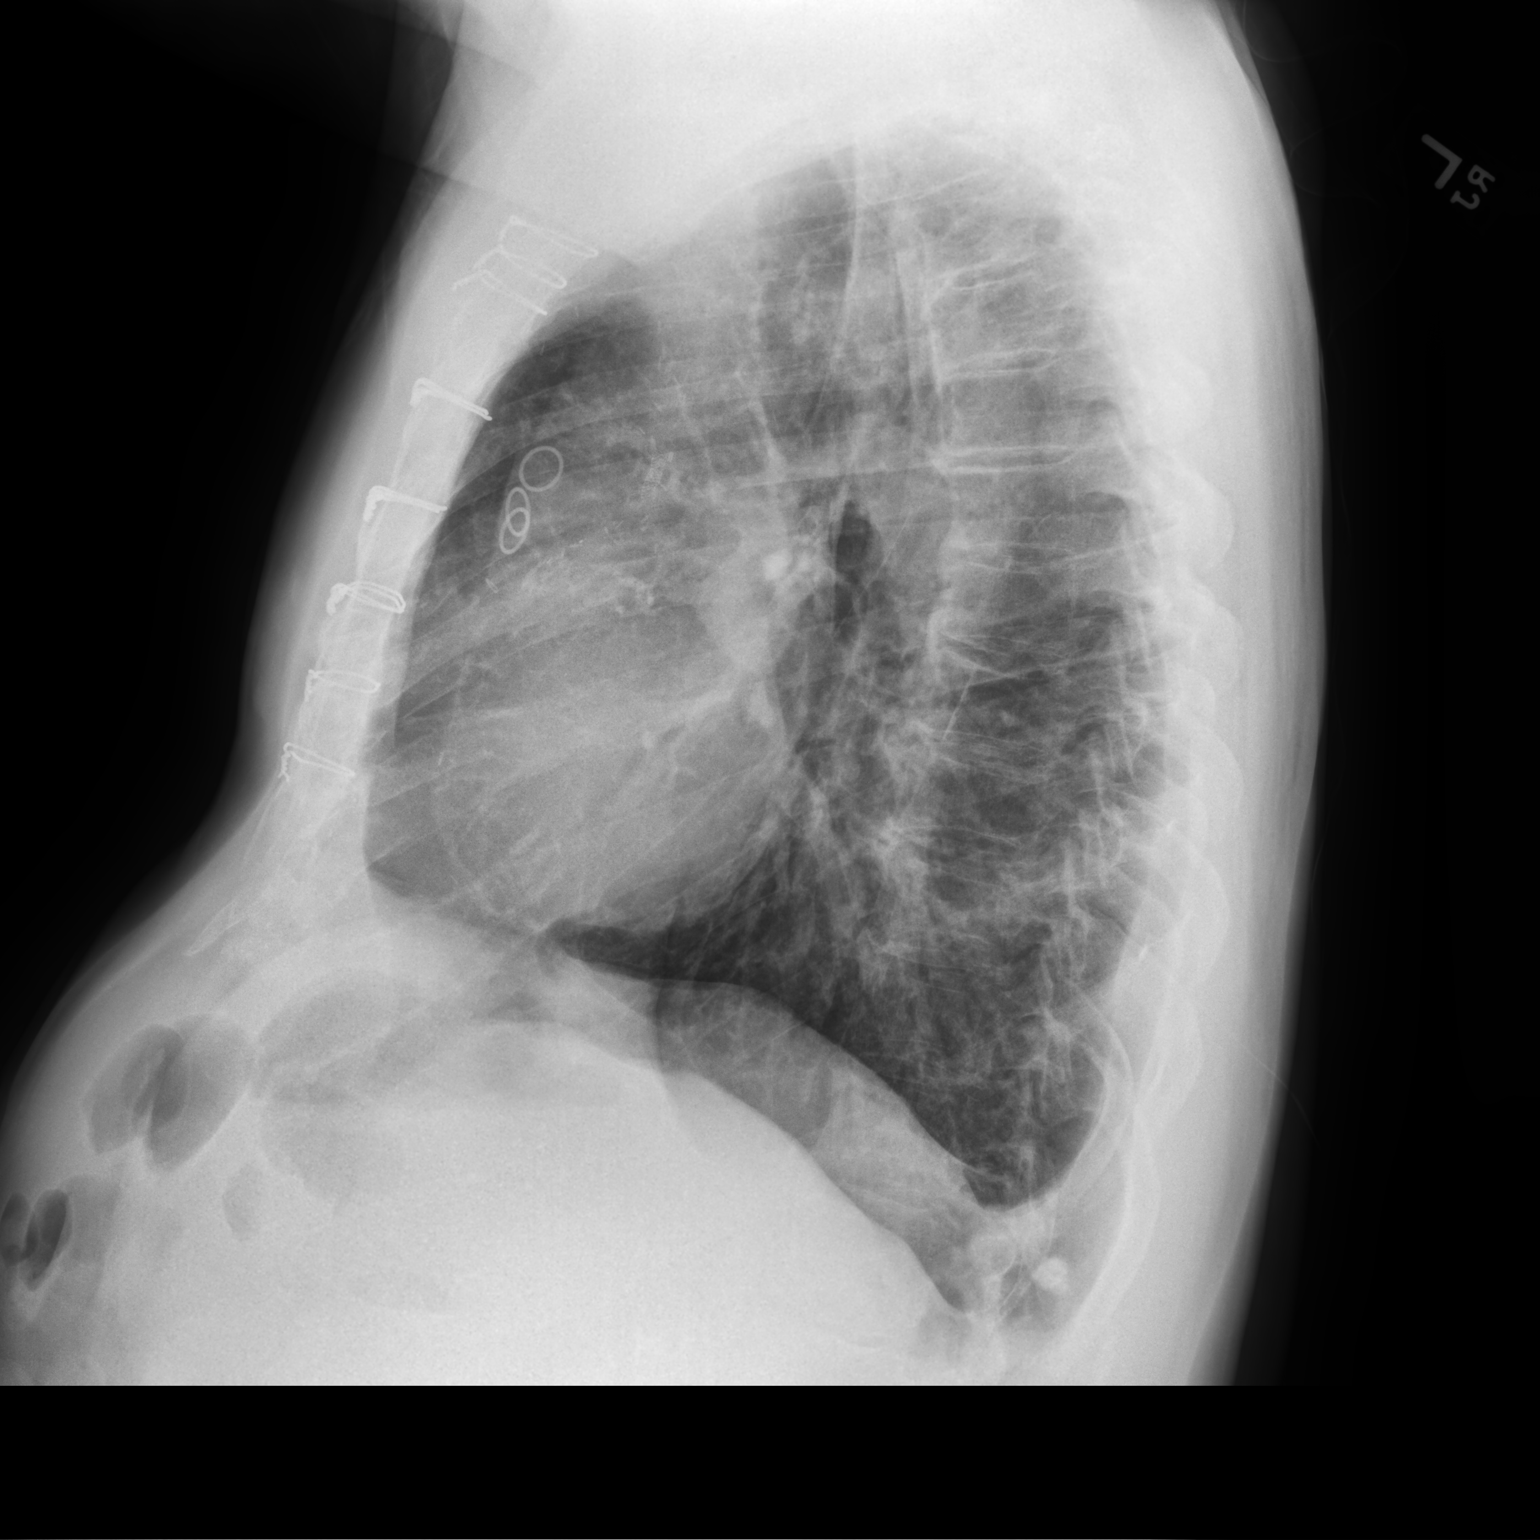

[2 of 2 positions shown; findings below may reference images not displayed]

FINDINGS: Post sternotomy changes. Small bilateral pleural effusions. 4.8 cm
irregular left upper lobe opacities suspicious for lung mass. Normal
heart size. Aortic atherosclerosis. No pneumothorax. Left anterior
rib anomaly again noted. Possible calcified lung nodule at the
posterior costophrenic sulcus.
IMPRESSION: 1. Small bilateral effusions.
2. Suspected 4.8 cm left upper lobe lung mass. Recommend chest CT
for further evaluation.

These results will be called to the ordering clinician or
representative by the Radiologist Assistant, and communication
documented in the PACS or [REDACTED].

## 2020-04-16 NOTE — Patient Instructions (Signed)
We will schedule you for a Cardiopulmonary Exercise test to evaluate your exertional dyspnea We will give you a call with the results Follow up as needed

## 2020-04-16 NOTE — Progress Notes (Signed)
Synopsis: Referred by Fransico Him, MD for exertional shortness of breath  Subjective:   PATIENT ID: John Hinton GENDER: male DOB: 08-23-1945, MRN: [id number]   HPI  Chief Complaint  Patient presents with  . Consult    SOB with activity   John Hinton is a 74 year old male, former smoker with coronary artery disease and obstructive sleep apnea on CPAP who is referred to pulmonary clinic for exertional shortness of breath.  He reports noticing increasing dyspnea with activities over the past 1 year. He notices more dyspnea when playing golf or when he was on a ski trip this past winter. He does have occasional chest tightness with the exertional shortness of breath. He denies cough or wheezing. He denies sinus issues or reflux issues. He has visited with his cardiology team for the shortness of breath and ECHO showed EF 55-60%, mild LVH, and grade I diastolic dysfunction. RV systolic function and size is normal with normal estimated PA pressure. The NM cardiac stress test indicated mild reduction in EF at 46% and no new areas of ischemia. He was then ordered for pulmonary function tests which are unremarkable with normal spirometry, lung volumes and diffusion capacity.   He is compliant with his CPAP per the cardiology visit, his AHI is 2.5/hr on 8cmH2O with 90% compliance.   He is a former smoker, quit in the 1980s. 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 He does not have exposure to birds or exotic birds.     Past Medical History:  Diagnosis Date  . Arrhythmia    Post-op  . BPH (benign prostatic hypertrophy)   . Bradycardia    a. H/o asymptomatic bradycardia.  . Coronary artery disease    a. s/p CABG ~2007 with LIMA to LAD, SVG seq to OM1 and OM2, SVG to PDA and SVG to diag. b. Abnormal nuc 03/2015 - s/p DES to SVG-OM1-OM2. 01/29/17 POBA with cutting balloon to SVG-->OM  . DCM (dilated cardiomyopathy) ([place])  09/17/2015  . Dyslipidemia   . ED (erectile dysfunction)   . Fear of heights   . Hypertension   . Ischemic cardiomyopathy    a. EF 46% by nuc 03/2015.  Marland Kitchen OSA (obstructive sleep apnea) 07/12/2015   Moderate OSA with AHI 16.5/hr now on CPAP at 8cm H2O  . RBBB   . Shoulder, capsulitis, adhesive    Left Shoulder     Family History  Problem Relation Age of Onset  . Lung cancer Mother   . Lung cancer Father   . Heart disease Father   . Heart failure Father   . Cancer Sister      Social History   Socioeconomic History  . Marital status: Married    Spouse name: Not on file  . Number of children: Not on file  . Years of education: Not on file  . Highest education level: Not on file  Occupational History  . Not on file  Tobacco Use  . Smoking status: Former Smoker    Types: Cigarettes    Quit date: 07/21/1983    Years since quitting: 36.7  . Smokeless tobacco: Never Used  Substance and Sexual Activity  . Alcohol use: Yes    Alcohol/week: 2.0 standard drinks    Types: 2 Cans of beer per week    Comment: daily  . Drug use: No  . Sexual activity: Not on file  Other Topics Concern  .  Not on file  Social History Narrative  . Not on file   Social Determinants of Health   Financial Resource Strain:   . Difficulty of Paying Living Expenses: Not on file  Food Insecurity:   . Worried About Charity fundraiser in the Last Year: Not on file  . Ran Out of Food in the Last Year: Not on file  Transportation Needs:   . Lack of Transportation (Medical): Not on file  . Lack of Transportation (Non-Medical): Not on file  Physical Activity:   . Days of Exercise per Week: Not on file  . Minutes of Exercise per Session: Not on file  Stress:   . Feeling of Stress : Not on file  Social Connections:   . Frequency of Communication with Friends and Family: Not on file  . Frequency of Social Gatherings with Friends and Family: Not on file  . Attends Religious Services: Not on file  .  Active Member of Clubs or Organizations: Not on file  . Attends Archivist Meetings: Not on file  . Marital Status: Not on file  Intimate Partner Violence:   . Fear of Current or Ex-Partner: Not on file  . Emotionally Abused: Not on file  . Physically Abused: Not on file  . Sexually Abused: Not on file     Allergies  Allergen Reactions  . Amlodipine Swelling    Edema     Outpatient Medications Prior to Visit  Medication Sig Dispense Refill  . ALPRAZolam (XANAX) 0.5 MG tablet Take 0.5 mg by mouth 2 (two) times daily as needed for anxiety.    Marland Kitchen aspirin 81 MG tablet Take 81 mg by mouth at bedtime.     . cholecalciferol (VITAMIN D) 1000 UNITS tablet Take 1,000 Units by mouth at bedtime.     . clopidogrel (PLAVIX) 75 MG tablet TAKE ONE TABLET BY MOUTH EVERY MORNING WITH BREAKFAST 90 tablet 0  . Cyanocobalamin (B-12 PO) Take 1 tablet by mouth daily.    Marland Kitchen doxazosin (CARDURA) 2 MG tablet Take 1 tablet (2 mg total) by mouth daily. 90 tablet 2  . ezetimibe (ZETIA) 10 MG tablet TAKE ONE TABLET BY MOUTH EVERY NIGHT AT BEDTIME 90 tablet 0  . folic acid (FOLVITE) 361 MCG tablet Take 400 mcg by mouth daily.    . Multiple Vitamin (MULTIVITAMIN) tablet Take 1 tablet by mouth daily.    . nitroGLYCERIN (NITROSTAT) 0.4 MG SL tablet Place 1 tablet (0.4 mg total) under the tongue every 5 (five) minutes as needed for chest pain. 25 tablet 3  . ramipril (ALTACE) 10 MG capsule TAKE TWO CAPSULES BY MOUTH DAILY 180 capsule 0  . rosuvastatin (CRESTOR) 40 MG tablet TAKE ONE TABLET BY MOUTH EVERY NIGHT AT BEDTIME 90 tablet 0  . sertraline (ZOLOFT) 50 MG tablet Take 50 mg by mouth daily.    Marland Kitchen spironolactone (ALDACTONE) 25 MG tablet TAKE ONE TABLET BY MOUTH DAILY 90 tablet 0   No facility-administered medications prior to visit.    Review of Systems  Constitutional: Negative for chills, diaphoresis, fever, malaise/fatigue and weight loss.  HENT: Negative for congestion, ear pain, nosebleeds, sinus  pain and sore throat.   Eyes: Negative for blurred vision.  Respiratory: Positive for shortness of breath. Negative for cough, hemoptysis, sputum production and wheezing.   Cardiovascular: Negative for chest pain, palpitations, orthopnea, claudication, leg swelling and PND.  Gastrointestinal: Negative for abdominal pain, blood in stool, constipation, diarrhea, heartburn, melena, nausea and vomiting.  Genitourinary:  Negative for dysuria and hematuria.  Musculoskeletal: Negative for joint pain and myalgias.  Skin: Negative for itching and rash.  Neurological: Negative for dizziness, weakness and headaches.  Endo/Heme/Allergies: Does not bruise/bleed easily.  Psychiatric/Behavioral: Negative.    Objective:   Vitals:   04/16/20 1411  BP: 112/70  Pulse: 60  Temp: 98.3 F (36.8 C)  TempSrc: Oral  SpO2: 99%  Weight: 210 lb (95.3 kg)  Height: 5\' 11"  (1.803 m)   Physical Exam Constitutional:      General: He is not in acute distress.    Appearance: Normal appearance. He is normal weight. He is not ill-appearing.  HENT:     Head: Normocephalic and atraumatic.     Nose: Nose normal. No congestion.     Mouth/Throat:     Mouth: Mucous membranes are moist.     Pharynx: Oropharynx is clear. No oropharyngeal exudate or posterior oropharyngeal erythema.  Eyes:     General: No scleral icterus.    Extraocular Movements: Extraocular movements intact.     Conjunctiva/sclera: Conjunctivae normal.     Pupils: Pupils are equal, round, and reactive to light.  Cardiovascular:     Rate and Rhythm: Normal rate.     Pulses: Normal pulses.     Heart sounds: Normal heart sounds. No murmur heard.   Pulmonary:     Effort: Pulmonary effort is normal. No respiratory distress.     Breath sounds: Normal breath sounds. No wheezing, rhonchi or rales.  Abdominal:     General: Bowel sounds are normal.     Palpations: Abdomen is soft.  Musculoskeletal:     Cervical back: Neck supple.     Right lower leg:  Edema (trace) present.     Left lower leg: Edema (trace) present.  Lymphadenopathy:     Cervical: No cervical adenopathy.  Skin:    General: Skin is warm and dry.     Capillary Refill: Capillary refill takes less than 2 seconds.     Findings: No lesion or rash.  Neurological:     General: No focal deficit present.     Mental Status: He is alert and oriented to person, place, and time. Mental status is at baseline.     Gait: Gait normal.  Psychiatric:        Mood and Affect: Mood normal.        Behavior: Behavior normal.        Thought Content: Thought content normal.        Judgment: Judgment normal.    Ambulatory Oximetry: Walked 250 on room air, Oxygen saturations were 98%, 96% and 97% throughout the walk test.  CBC    Component Value Date/Time   WBC 5.4 01/27/2017 0834   WBC 4.7 04/05/2015 1000   RBC 4.01 (L) 01/27/2017 0834   RBC 4.28 04/05/2015 1000   HGB 13.7 01/27/2017 0834   HCT 39.2 01/27/2017 0834   PLT 165 01/27/2017 0834   MCV 98 (H) 01/27/2017 0834   MCH 34.2 (H) 01/27/2017 0834   MCH 32.7 04/05/2015 1000   MCHC 34.9 01/27/2017 0834   MCHC 33.4 04/05/2015 1000   RDW 12.9 01/27/2017 0834   LYMPHSABS 1.0 01/27/2017 0834   EOSABS 0.1 01/27/2017 0834   BASOSABS 0.0 01/27/2017 0834   BMP Latest Ref Rng & Units 02/05/2020 01/19/2019 12/07/2017  Glucose 65 - 99 mg/dL 95 110(H) 91  BUN 8 - 27 mg/dL 21 22 19   Creatinine 0.76 - 1.27 mg/dL 1.00 1.16 1.00  BUN/Creat  Ratio 10 - 24 21 19 19   Sodium 134 - 144 mmol/L 141 140 140  Potassium 3.5 - 5.2 mmol/L 4.1 4.4 4.3  Chloride 96 - 106 mmol/L 103 103 103  CO2 20 - 29 mmol/L 25 22 21   Calcium 8.6 - 10.2 mg/dL 9.5 8.9 9.8   PFT: FEV1/FVC 75, Post FEV1 98%, FVC 96%, No significant bronchodilator response. TLC 95%, DLCO 106%  Labs: Reviewed as above  Echo: 02/28/20 1. Left ventricular ejection fraction, by estimation, is 55 to 60%. The  left ventricle has normal function. The left ventricle has no regional  wall  motion abnormalities. There is mild left ventricular hypertrophy.  Left ventricular diastolic parameters  are consistent with Grade I diastolic dysfunction (impaired relaxation).  The average left ventricular global longitudinal strain is -17.3 %. The  global longitudinal strain is normal.  2. Right ventricular systolic function is normal. The right ventricular  size is normal. There is normal pulmonary artery systolic pressure.  3. The mitral valve is normal in structure. Mild mitral valve  regurgitation. No evidence of mitral stenosis.  4. The aortic valve is tricuspid. Aortic valve regurgitation is not  visualized. Mild aortic valve sclerosis is present, with no evidence of  aortic valve stenosis.  5. Aortic dilatation noted. There is mild dilatation of the ascending  aorta measuring 38 mm.  6. The inferior vena cava is normal in size with greater than 50%  respiratory variability, suggesting right atrial pressure of 3 mmHg.  Heart Catheterization: 01/29/2017 1. Severe 3 vessel occlusive CAD 2. Patent LIMA to the LAD 3. Patent SVG to the first diagonal 4. Patent SVG to PDA 5. SVG to OM with severe focal instent restenosis  6. Normal LV function 7. Normal LVEDP 8. Successful Cutting balloon angioplasty of the SVG to OM for in stent restenosis.   Stress Test 02/28/2020  Nuclear stress EF: 46%.  There was no ST segment deviation noted during stress.  Defect 1: There is a small defect of severe severity present in the basal inferolateral and basal anterolateral location.  Findings consistent with prior myocardial infarction in the left circumfex territory.  This is an intermediate risk study due to reduced systolic function and prior infarct. There is no ischemia.  The left ventricular ejection fraction is mildly decreased (45-54%).  Assessment & Plan:   Exertional dyspnea - Plan: Cardiopulmonary exercise test, DG Chest 2 View  Discussion: John Hinton is a 74 year  old male, former smoker with coronary artery disease, Heart Failure reduced EF and obstructive sleep apnea on CPAP who is referred to pulmonary clinic for exertional shortness of breath.  The etiology of his shortness of breath is unknown at this time. He does not have abnormalities on pulmonary function testing. He does have a mild reduction in EF on NM stress test as well as trace lower extremity edema on exam today. There is concern for underlying congestive heart failure contributing to his exertional shortness of breath. There could be a component of deconditioning. Given he has had echo, NM stress test and PFTs, we discussed pursuing a Cardiopulmonary exercise test to fully evaluate his dyspnea. He agreed to moving forward with this study. We will also update his chest imaging as he has not had a chest radiograph in years.    We will call the patient with the results of the testing and schedule follow up if needed.  Freda Jackson, MD [facility] Office: [phone]    Current Outpatient  Medications:  .  ALPRAZolam (XANAX) 0.5 MG tablet, Take 0.5 mg by mouth 2 (two) times daily as needed for anxiety., Disp: , Rfl:  .  aspirin 81 MG tablet, Take 81 mg by mouth at bedtime. , Disp: , Rfl:  .  cholecalciferol (VITAMIN D) 1000 UNITS tablet, Take 1,000 Units by mouth at bedtime. , Disp: , Rfl:  .  clopidogrel (PLAVIX) 75 MG tablet, TAKE ONE TABLET BY MOUTH EVERY MORNING WITH BREAKFAST, Disp: 90 tablet, Rfl: 0 .  Cyanocobalamin (B-12 PO), Take 1 tablet by mouth daily., Disp: , Rfl:  .  doxazosin (CARDURA) 2 MG tablet, Take 1 tablet (2 mg total) by mouth daily., Disp: 90 tablet, Rfl: 2 .  ezetimibe (ZETIA) 10 MG tablet, TAKE ONE TABLET BY MOUTH EVERY NIGHT AT BEDTIME, Disp: 90 tablet, Rfl: 0 .  folic acid (FOLVITE) 473 MCG tablet, Take 400 mcg by mouth daily., Disp: , Rfl:  .  Multiple Vitamin (MULTIVITAMIN) tablet, Take 1 tablet by mouth daily., Disp: , Rfl:  .   nitroGLYCERIN (NITROSTAT) 0.4 MG SL tablet, Place 1 tablet (0.4 mg total) under the tongue every 5 (five) minutes as needed for chest pain., Disp: 25 tablet, Rfl: 3 .  ramipril (ALTACE) 10 MG capsule, TAKE TWO CAPSULES BY MOUTH DAILY, Disp: 180 capsule, Rfl: 0 .  rosuvastatin (CRESTOR) 40 MG tablet, TAKE ONE TABLET BY MOUTH EVERY NIGHT AT BEDTIME, Disp: 90 tablet, Rfl: 0 .  sertraline (ZOLOFT) 50 MG tablet, Take 50 mg by mouth daily., Disp: , Rfl:  .  spironolactone (ALDACTONE) 25 MG tablet, TAKE ONE TABLET BY MOUTH DAILY, Disp: 90 tablet, Rfl: 0

## 2020-04-16 NOTE — H&P (View-Only) (Signed)
Synopsis: Referred by Fransico Him, MD for exertional shortness of breath  Subjective:   PATIENT ID: John Hinton GENDER: male DOB: 1945/10/16, MRN: [id number]   HPI  Chief Complaint  Patient presents with  . Consult    SOB with activity   John Hinton is a 74 year old male, former smoker with coronary artery disease and obstructive sleep apnea on CPAP who is referred to pulmonary clinic for exertional shortness of breath.  He reports noticing increasing dyspnea with activities over the past 1 year. He notices more dyspnea when playing golf or when he was on a ski trip this past winter. He does have occasional chest tightness with the exertional shortness of breath. He denies cough or wheezing. He denies sinus issues or reflux issues. He has visited with his cardiology team for the shortness of breath and ECHO showed EF 55-60%, mild LVH, and grade I diastolic dysfunction. RV systolic function and size is normal with normal estimated PA pressure. The NM cardiac stress test indicated mild reduction in EF at 46% and no new areas of ischemia. He was then ordered for pulmonary function tests which are unremarkable with normal spirometry, lung volumes and diffusion capacity.   He is compliant with his CPAP per the cardiology visit, his AHI is 2.5/hr on 8cmH2O with 90% compliance.   He is a former smoker, quit in the 1980s. 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 He does not have exposure to birds or exotic birds.     Past Medical History:  Diagnosis Date  . Arrhythmia    Post-op  . BPH (benign prostatic hypertrophy)   . Bradycardia    a. H/o asymptomatic bradycardia.  . Coronary artery disease    a. s/p CABG ~2007 with LIMA to LAD, SVG seq to OM1 and OM2, SVG to PDA and SVG to diag. b. Abnormal nuc 03/2015 - s/p DES to SVG-OM1-OM2. 01/29/17 POBA with cutting balloon to SVG-->OM  . DCM (dilated cardiomyopathy) ([place])  09/17/2015  . Dyslipidemia   . ED (erectile dysfunction)   . Fear of heights   . Hypertension   . Ischemic cardiomyopathy    a. EF 46% by nuc 03/2015.  Marland Kitchen OSA (obstructive sleep apnea) 07/12/2015   Moderate OSA with AHI 16.5/hr now on CPAP at 8cm H2O  . RBBB   . Shoulder, capsulitis, adhesive    Left Shoulder     Family History  Problem Relation Age of Onset  . Lung cancer Mother   . Lung cancer Father   . Heart disease Father   . Heart failure Father   . Cancer Sister      Social History   Socioeconomic History  . Marital status: Married    Spouse name: Not on file  . Number of children: Not on file  . Years of education: Not on file  . Highest education level: Not on file  Occupational History  . Not on file  Tobacco Use  . Smoking status: Former Smoker    Types: Cigarettes    Quit date: 07/21/1983    Years since quitting: 36.7  . Smokeless tobacco: Never Used  Substance and Sexual Activity  . Alcohol use: Yes    Alcohol/week: 2.0 standard drinks    Types: 2 Cans of beer per week    Comment: daily  . Drug use: No  . Sexual activity: Not on file  Other Topics Concern  .  Not on file  Social History Narrative  . Not on file   Social Determinants of Health   Financial Resource Strain:   . Difficulty of Paying Living Expenses: Not on file  Food Insecurity:   . Worried About Charity fundraiser in the Last Year: Not on file  . Ran Out of Food in the Last Year: Not on file  Transportation Needs:   . Lack of Transportation (Medical): Not on file  . Lack of Transportation (Non-Medical): Not on file  Physical Activity:   . Days of Exercise per Week: Not on file  . Minutes of Exercise per Session: Not on file  Stress:   . Feeling of Stress : Not on file  Social Connections:   . Frequency of Communication with Friends and Family: Not on file  . Frequency of Social Gatherings with Friends and Family: Not on file  . Attends Religious Services: Not on file  .  Active Member of Clubs or Organizations: Not on file  . Attends Archivist Meetings: Not on file  . Marital Status: Not on file  Intimate Partner Violence:   . Fear of Current or Ex-Partner: Not on file  . Emotionally Abused: Not on file  . Physically Abused: Not on file  . Sexually Abused: Not on file     Allergies  Allergen Reactions  . Amlodipine Swelling    Edema     Outpatient Medications Prior to Visit  Medication Sig Dispense Refill  . ALPRAZolam (XANAX) 0.5 MG tablet Take 0.5 mg by mouth 2 (two) times daily as needed for anxiety.    Marland Kitchen aspirin 81 MG tablet Take 81 mg by mouth at bedtime.     . cholecalciferol (VITAMIN D) 1000 UNITS tablet Take 1,000 Units by mouth at bedtime.     . clopidogrel (PLAVIX) 75 MG tablet TAKE ONE TABLET BY MOUTH EVERY MORNING WITH BREAKFAST 90 tablet 0  . Cyanocobalamin (B-12 PO) Take 1 tablet by mouth daily.    Marland Kitchen doxazosin (CARDURA) 2 MG tablet Take 1 tablet (2 mg total) by mouth daily. 90 tablet 2  . ezetimibe (ZETIA) 10 MG tablet TAKE ONE TABLET BY MOUTH EVERY NIGHT AT BEDTIME 90 tablet 0  . folic acid (FOLVITE) 416 MCG tablet Take 400 mcg by mouth daily.    . Multiple Vitamin (MULTIVITAMIN) tablet Take 1 tablet by mouth daily.    . nitroGLYCERIN (NITROSTAT) 0.4 MG SL tablet Place 1 tablet (0.4 mg total) under the tongue every 5 (five) minutes as needed for chest pain. 25 tablet 3  . ramipril (ALTACE) 10 MG capsule TAKE TWO CAPSULES BY MOUTH DAILY 180 capsule 0  . rosuvastatin (CRESTOR) 40 MG tablet TAKE ONE TABLET BY MOUTH EVERY NIGHT AT BEDTIME 90 tablet 0  . sertraline (ZOLOFT) 50 MG tablet Take 50 mg by mouth daily.    Marland Kitchen spironolactone (ALDACTONE) 25 MG tablet TAKE ONE TABLET BY MOUTH DAILY 90 tablet 0   No facility-administered medications prior to visit.    Review of Systems  Constitutional: Negative for chills, diaphoresis, fever, malaise/fatigue and weight loss.  HENT: Negative for congestion, ear pain, nosebleeds, sinus  pain and sore throat.   Eyes: Negative for blurred vision.  Respiratory: Positive for shortness of breath. Negative for cough, hemoptysis, sputum production and wheezing.   Cardiovascular: Negative for chest pain, palpitations, orthopnea, claudication, leg swelling and PND.  Gastrointestinal: Negative for abdominal pain, blood in stool, constipation, diarrhea, heartburn, melena, nausea and vomiting.  Genitourinary:  Negative for dysuria and hematuria.  Musculoskeletal: Negative for joint pain and myalgias.  Skin: Negative for itching and rash.  Neurological: Negative for dizziness, weakness and headaches.  Endo/Heme/Allergies: Does not bruise/bleed easily.  Psychiatric/Behavioral: Negative.    Objective:   Vitals:   04/16/20 1411  BP: 112/70  Pulse: 60  Temp: 98.3 F (36.8 C)  TempSrc: Oral  SpO2: 99%  Weight: 210 lb (95.3 kg)  Height: 5\' 11"  (1.803 m)   Physical Exam Constitutional:      General: He is not in acute distress.    Appearance: Normal appearance. He is normal weight. He is not ill-appearing.  HENT:     Head: Normocephalic and atraumatic.     Nose: Nose normal. No congestion.     Mouth/Throat:     Mouth: Mucous membranes are moist.     Pharynx: Oropharynx is clear. No oropharyngeal exudate or posterior oropharyngeal erythema.  Eyes:     General: No scleral icterus.    Extraocular Movements: Extraocular movements intact.     Conjunctiva/sclera: Conjunctivae normal.     Pupils: Pupils are equal, round, and reactive to light.  Cardiovascular:     Rate and Rhythm: Normal rate.     Pulses: Normal pulses.     Heart sounds: Normal heart sounds. No murmur heard.   Pulmonary:     Effort: Pulmonary effort is normal. No respiratory distress.     Breath sounds: Normal breath sounds. No wheezing, rhonchi or rales.  Abdominal:     General: Bowel sounds are normal.     Palpations: Abdomen is soft.  Musculoskeletal:     Cervical back: Neck supple.     Right lower leg:  Edema (trace) present.     Left lower leg: Edema (trace) present.  Lymphadenopathy:     Cervical: No cervical adenopathy.  Skin:    General: Skin is warm and dry.     Capillary Refill: Capillary refill takes less than 2 seconds.     Findings: No lesion or rash.  Neurological:     General: No focal deficit present.     Mental Status: He is alert and oriented to person, place, and time. Mental status is at baseline.     Gait: Gait normal.  Psychiatric:        Mood and Affect: Mood normal.        Behavior: Behavior normal.        Thought Content: Thought content normal.        Judgment: Judgment normal.    Ambulatory Oximetry: Walked 250 on room air, Oxygen saturations were 98%, 96% and 97% throughout the walk test.  CBC    Component Value Date/Time   WBC 5.4 01/27/2017 0834   WBC 4.7 04/05/2015 1000   RBC 4.01 (L) 01/27/2017 0834   RBC 4.28 04/05/2015 1000   HGB 13.7 01/27/2017 0834   HCT 39.2 01/27/2017 0834   PLT 165 01/27/2017 0834   MCV 98 (H) 01/27/2017 0834   MCH 34.2 (H) 01/27/2017 0834   MCH 32.7 04/05/2015 1000   MCHC 34.9 01/27/2017 0834   MCHC 33.4 04/05/2015 1000   RDW 12.9 01/27/2017 0834   LYMPHSABS 1.0 01/27/2017 0834   EOSABS 0.1 01/27/2017 0834   BASOSABS 0.0 01/27/2017 0834   BMP Latest Ref Rng & Units 02/05/2020 01/19/2019 12/07/2017  Glucose 65 - 99 mg/dL 95 110(H) 91  BUN 8 - 27 mg/dL 21 22 19   Creatinine 0.76 - 1.27 mg/dL 1.00 1.16 1.00  BUN/Creat  Ratio 10 - 24 21 19 19   Sodium 134 - 144 mmol/L 141 140 140  Potassium 3.5 - 5.2 mmol/L 4.1 4.4 4.3  Chloride 96 - 106 mmol/L 103 103 103  CO2 20 - 29 mmol/L 25 22 21   Calcium 8.6 - 10.2 mg/dL 9.5 8.9 9.8   PFT: FEV1/FVC 75, Post FEV1 98%, FVC 96%, No significant bronchodilator response. TLC 95%, DLCO 106%  Labs: Reviewed as above  Echo: 02/28/20 1. Left ventricular ejection fraction, by estimation, is 55 to 60%. The  left ventricle has normal function. The left ventricle has no regional  wall  motion abnormalities. There is mild left ventricular hypertrophy.  Left ventricular diastolic parameters  are consistent with Grade I diastolic dysfunction (impaired relaxation).  The average left ventricular global longitudinal strain is -17.3 %. The  global longitudinal strain is normal.  2. Right ventricular systolic function is normal. The right ventricular  size is normal. There is normal pulmonary artery systolic pressure.  3. The mitral valve is normal in structure. Mild mitral valve  regurgitation. No evidence of mitral stenosis.  4. The aortic valve is tricuspid. Aortic valve regurgitation is not  visualized. Mild aortic valve sclerosis is present, with no evidence of  aortic valve stenosis.  5. Aortic dilatation noted. There is mild dilatation of the ascending  aorta measuring 38 mm.  6. The inferior vena cava is normal in size with greater than 50%  respiratory variability, suggesting right atrial pressure of 3 mmHg.  Heart Catheterization: 01/29/2017 1. Severe 3 vessel occlusive CAD 2. Patent LIMA to the LAD 3. Patent SVG to the first diagonal 4. Patent SVG to PDA 5. SVG to OM with severe focal instent restenosis  6. Normal LV function 7. Normal LVEDP 8. Successful Cutting balloon angioplasty of the SVG to OM for in stent restenosis.   Stress Test 02/28/2020  Nuclear stress EF: 46%.  There was no ST segment deviation noted during stress.  Defect 1: There is a small defect of severe severity present in the basal inferolateral and basal anterolateral location.  Findings consistent with prior myocardial infarction in the left circumfex territory.  This is an intermediate risk study due to reduced systolic function and prior infarct. There is no ischemia.  The left ventricular ejection fraction is mildly decreased (45-54%).  Assessment & Plan:   Exertional dyspnea - Plan: Cardiopulmonary exercise test, DG Chest 2 View  Discussion: John Hinton is a 74 year  old male, former smoker with coronary artery disease, Heart Failure reduced EF and obstructive sleep apnea on CPAP who is referred to pulmonary clinic for exertional shortness of breath.  The etiology of his shortness of breath is unknown at this time. He does not have abnormalities on pulmonary function testing. He does have a mild reduction in EF on NM stress test as well as trace lower extremity edema on exam today. There is concern for underlying congestive heart failure contributing to his exertional shortness of breath. There could be a component of deconditioning. Given he has had echo, NM stress test and PFTs, we discussed pursuing a Cardiopulmonary exercise test to fully evaluate his dyspnea. He agreed to moving forward with this study. We will also update his chest imaging as he has not had a chest radiograph in years.    We will call the patient with the results of the testing and schedule follow up if needed.  Freda Jackson, MD [facility] Office: [phone]    Current Outpatient  Medications:  .  ALPRAZolam (XANAX) 0.5 MG tablet, Take 0.5 mg by mouth 2 (two) times daily as needed for anxiety., Disp: , Rfl:  .  aspirin 81 MG tablet, Take 81 mg by mouth at bedtime. , Disp: , Rfl:  .  cholecalciferol (VITAMIN D) 1000 UNITS tablet, Take 1,000 Units by mouth at bedtime. , Disp: , Rfl:  .  clopidogrel (PLAVIX) 75 MG tablet, TAKE ONE TABLET BY MOUTH EVERY MORNING WITH BREAKFAST, Disp: 90 tablet, Rfl: 0 .  Cyanocobalamin (B-12 PO), Take 1 tablet by mouth daily., Disp: , Rfl:  .  doxazosin (CARDURA) 2 MG tablet, Take 1 tablet (2 mg total) by mouth daily., Disp: 90 tablet, Rfl: 2 .  ezetimibe (ZETIA) 10 MG tablet, TAKE ONE TABLET BY MOUTH EVERY NIGHT AT BEDTIME, Disp: 90 tablet, Rfl: 0 .  folic acid (FOLVITE) 010 MCG tablet, Take 400 mcg by mouth daily., Disp: , Rfl:  .  Multiple Vitamin (MULTIVITAMIN) tablet, Take 1 tablet by mouth daily., Disp: , Rfl:  .   nitroGLYCERIN (NITROSTAT) 0.4 MG SL tablet, Place 1 tablet (0.4 mg total) under the tongue every 5 (five) minutes as needed for chest pain., Disp: 25 tablet, Rfl: 3 .  ramipril (ALTACE) 10 MG capsule, TAKE TWO CAPSULES BY MOUTH DAILY, Disp: 180 capsule, Rfl: 0 .  rosuvastatin (CRESTOR) 40 MG tablet, TAKE ONE TABLET BY MOUTH EVERY NIGHT AT BEDTIME, Disp: 90 tablet, Rfl: 0 .  sertraline (ZOLOFT) 50 MG tablet, Take 50 mg by mouth daily., Disp: , Rfl:  .  spironolactone (ALDACTONE) 25 MG tablet, TAKE ONE TABLET BY MOUTH DAILY, Disp: 90 tablet, Rfl: 0

## 2020-04-18 ENCOUNTER — Telehealth: Payer: Self-pay

## 2020-04-18 DIAGNOSIS — R9389 Abnormal findings on diagnostic imaging of other specified body structures: Secondary | ICD-10-CM

## 2020-04-18 NOTE — Telephone Encounter (Signed)
DG Chest 2 View (Accession [id number]) (Order [id number]) Imaging Date: 04/16/2020 Department: [facility] Released By: Varney Biles, RT Authorizing: Freddi Starr, MD  Exam Status  Status  Final [99]  PACS Intelerad Image Link  Show images for DG Chest 2 View Study Result  Narrative & Impression  CLINICAL DATA:  Shortness of breath  EXAM: CHEST - 2 VIEW  COMPARISON:  02/08/2017  FINDINGS: Post sternotomy changes. Small bilateral pleural effusions. 4.8 cm irregular left upper lobe opacities suspicious for lung mass. Normal heart size. Aortic atherosclerosis. No pneumothorax. Left anterior rib anomaly again noted. Possible calcified lung nodule at the posterior costophrenic sulcus.  IMPRESSION: 1. Small bilateral effusions. 2. Suspected 4.8 cm left upper lobe lung mass. Recommend chest CT for further evaluation.  These results will be called to the ordering clinician or representative by the Radiologist Assistant, and communication documented in the PACS or [employer].   Electronically Signed   By: Donavan Foil M.D.   On: 04/17/2020 22:12

## 2020-04-18 NOTE — Telephone Encounter (Signed)
Patient was seen for pulmonary consult on September 28 with Dr. Erin Fulling,  Chest x-ray, report shows a 4.8 cm left upper lobe mass. Tried to call patient to discuss x-ray results as Dr. Erin Fulling is not available. There was no answer. I am glad to discuss these results when he calls back.  He will need a CT chest to further evaluate this area He will need a CT chest with contrast.  Will need a bmet lab to check kidney function.

## 2020-04-18 NOTE — Telephone Encounter (Signed)
I called patient's cell phone/home number with no answer. I called his work phone number and message left for him to give [place] a call. 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

## 2020-04-20 DIAGNOSIS — Z23 Encounter for immunization: Secondary | ICD-10-CM | POA: Diagnosis not present

## 2020-04-22 NOTE — Telephone Encounter (Signed)
Looks like each of you have tried to call this patient. Would you like for [place] to call him and see if we have any luck or send a letter to please call office? How would you like to proceed?

## 2020-04-22 NOTE — Telephone Encounter (Signed)
I spoke with the patient this morning and Lauren will be ordering the CT chest with contrast for him. We are all set. Thank you

## 2020-04-22 NOTE — Telephone Encounter (Signed)
Please order a CT chest scan with contrast to be done ASAP. Patient will need BMP to check renal function.   Thanks, Wille Glaser

## 2020-04-22 NOTE — Telephone Encounter (Signed)
Patient sent email asking about the Chest xray results. The results were released to patient and in the impression states recommended CT. Patient would like to know if there is anything he needs to be worried about or needs to do.  Dr. Erin Fulling please advise on Chest xray results on 04/11/20

## 2020-04-23 ENCOUNTER — Other Ambulatory Visit (INDEPENDENT_AMBULATORY_CARE_PROVIDER_SITE_OTHER): Payer: Medicare Other

## 2020-04-23 DIAGNOSIS — R9389 Abnormal findings on diagnostic imaging of other specified body structures: Secondary | ICD-10-CM | POA: Diagnosis not present

## 2020-04-23 LAB — BASIC METABOLIC PANEL
BUN: 21 mg/dL (ref 6–23)
CO2: 26 mEq/L (ref 19–32)
Calcium: 9.6 mg/dL (ref 8.4–10.5)
Chloride: 104 mEq/L (ref 96–112)
Creatinine, Ser: 1.07 mg/dL (ref 0.40–1.50)
GFR: 68.8 mL/min (ref >60.00)
Glucose, Bld: 100 mg/dL — ABNORMAL HIGH (ref 70–99)
Potassium: 3.9 mEq/L (ref 3.5–5.1)
Sodium: 137 mEq/L (ref 135–145)

## 2020-04-26 ENCOUNTER — Ambulatory Visit (HOSPITAL_COMMUNITY)
Admission: RE | Admit: 2020-04-26 | Discharge: 2020-04-26 | Disposition: A | Discharge status: Home / Self Care | Payer: Medicare Other | Source: Ambulatory Visit | Attending: Pulmonary Disease | Admitting: Pulmonary Disease

## 2020-04-26 ENCOUNTER — Other Ambulatory Visit: Payer: Self-pay

## 2020-04-26 ENCOUNTER — Encounter (HOSPITAL_COMMUNITY): Payer: Self-pay

## 2020-04-26 DIAGNOSIS — J8410 Pulmonary fibrosis, unspecified: Secondary | ICD-10-CM | POA: Diagnosis not present

## 2020-04-26 DIAGNOSIS — R9389 Abnormal findings on diagnostic imaging of other specified body structures: Secondary | ICD-10-CM | POA: Diagnosis not present

## 2020-04-26 DIAGNOSIS — I2510 Atherosclerotic heart disease of native coronary artery without angina pectoris: Secondary | ICD-10-CM | POA: Diagnosis not present

## 2020-04-26 DIAGNOSIS — I712 Thoracic aortic aneurysm, without rupture: Secondary | ICD-10-CM | POA: Diagnosis not present

## 2020-04-26 DIAGNOSIS — J479 Bronchiectasis, uncomplicated: Secondary | ICD-10-CM | POA: Diagnosis not present

## 2020-04-26 IMAGING — CT CT CHEST W/ CM
2 of 5 series · 15 of 36 positions shown, 18 images · IV contrast (omnipaque)
Comparison: None.

CLINICAL DATA: Lung nodule

EXAM:
CT CHEST WITH CONTRAST
TECHNIQUE: Multidetector CT imaging of the chest was performed during
intravenous contrast administration.
CONTRAST:  75mL OMNIPAQUE IOHEXOL 300 MG/ML  SOLN

[Series 4: super d · axial · 0.84mm/px · z∈[-372,-41]mm · 12 of 393 slices shown, 15 images]
[im 31/393  mediastinal]
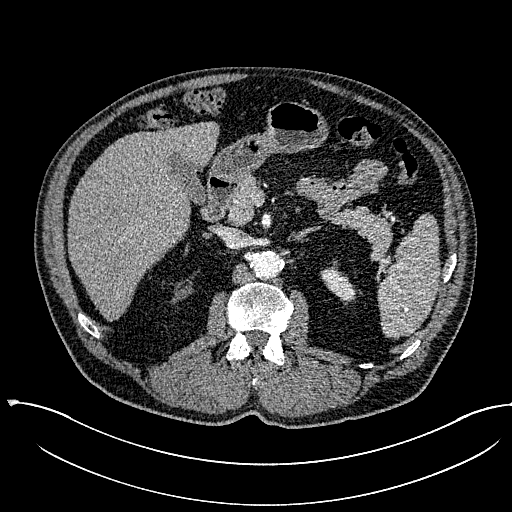
[im 31/393  lung]
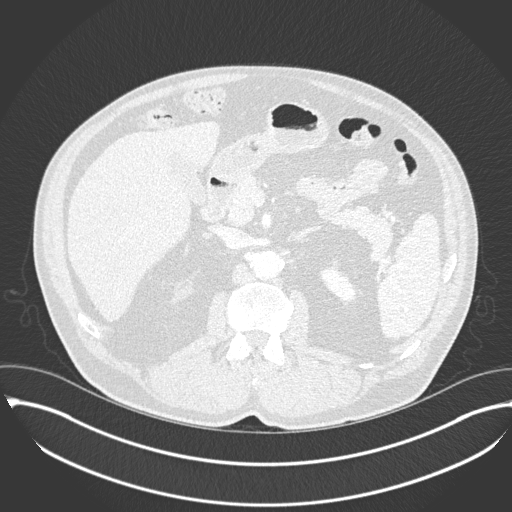
[im 61/393  lung]
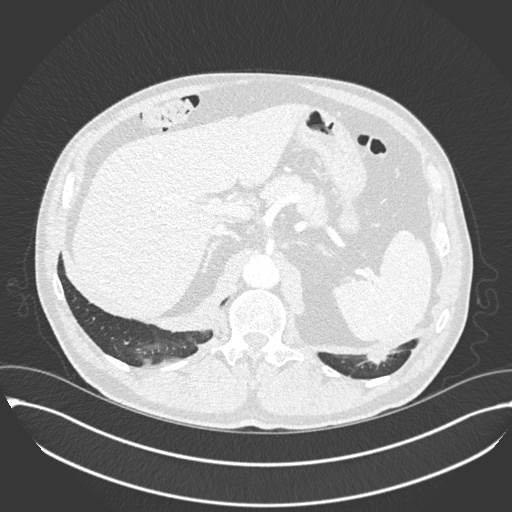
[im 91/393  lung]
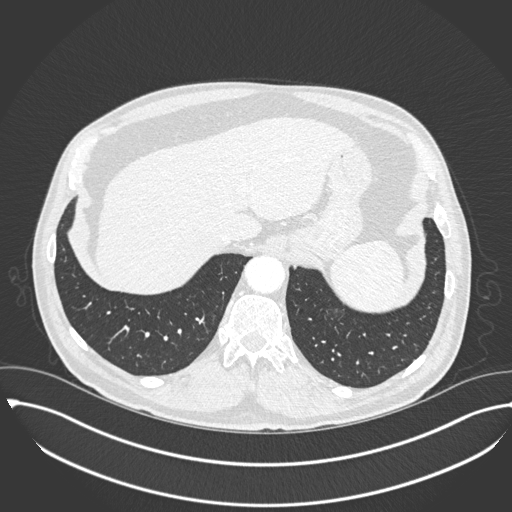
[im 121/393  lung]
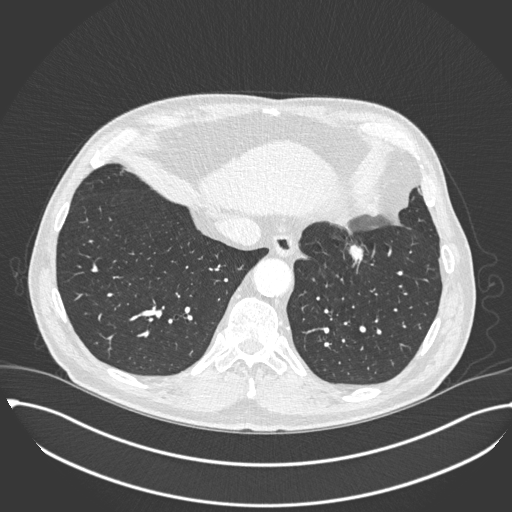
[im 151/393  mediastinal]
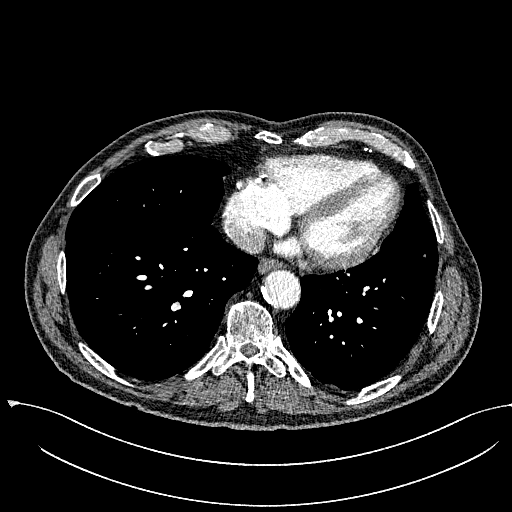
[im 151/393  lung]
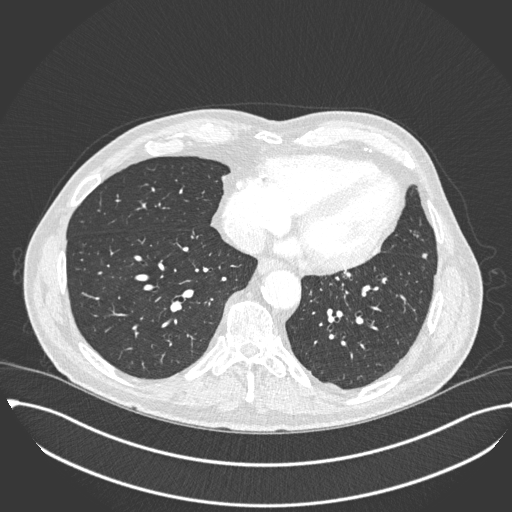
[im 181/393  lung]
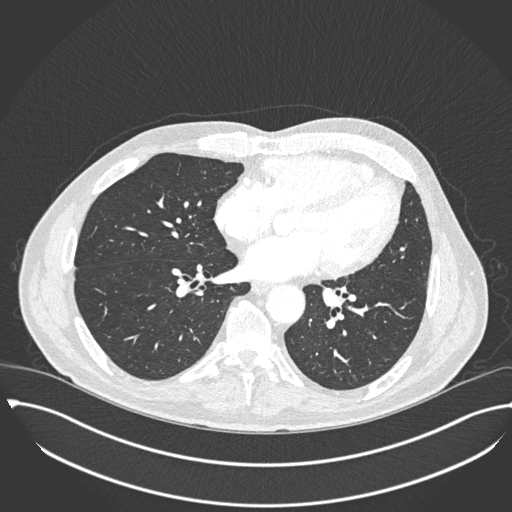
[im 212/393  lung]
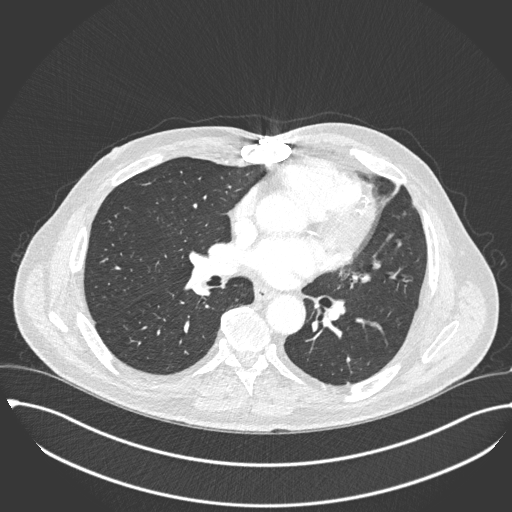
[im 242/393  lung]
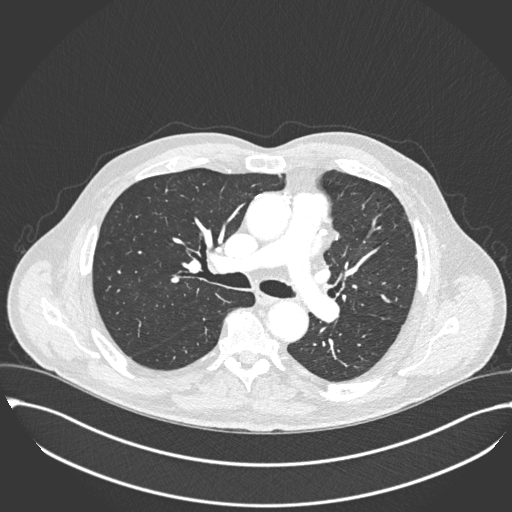
[im 272/393  mediastinal]
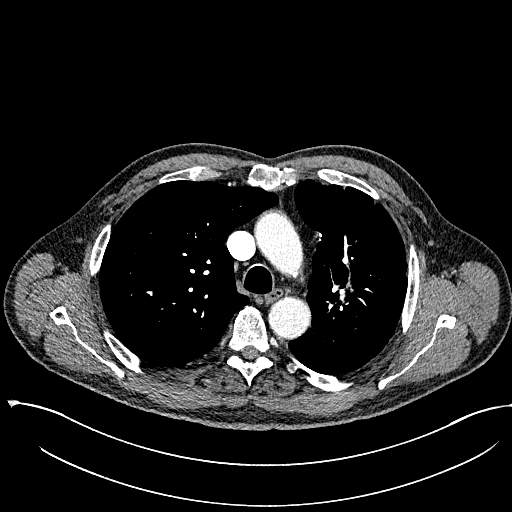
[im 272/393  lung]
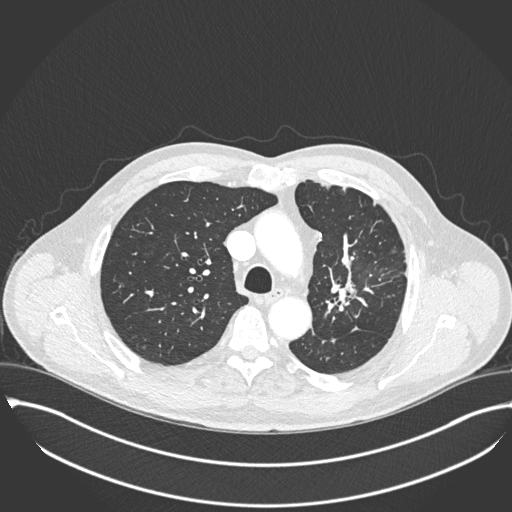
[im 302/393  lung]
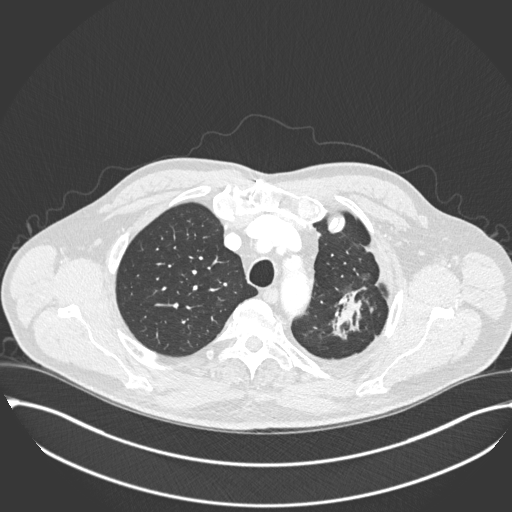
[im 332/393  lung]
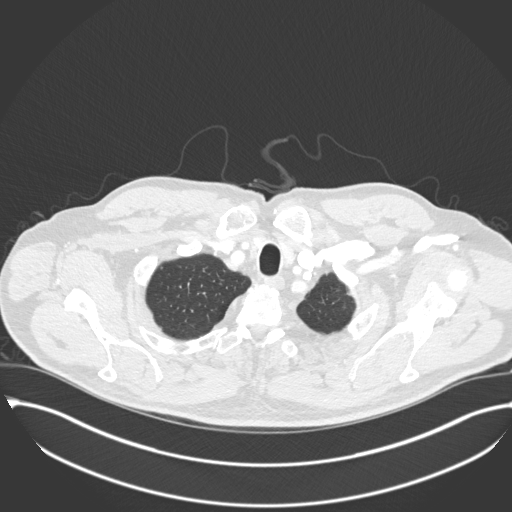
[im 362/393  lung]
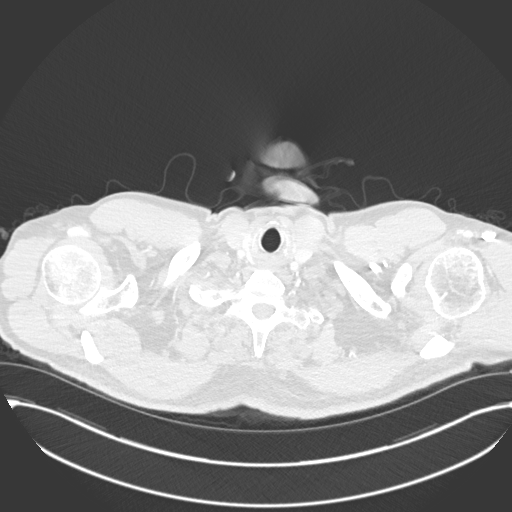

[Series 5: coronal · coronal · 0.79mm/px · 3 of 161 slices shown]
[im 33/161  lung]
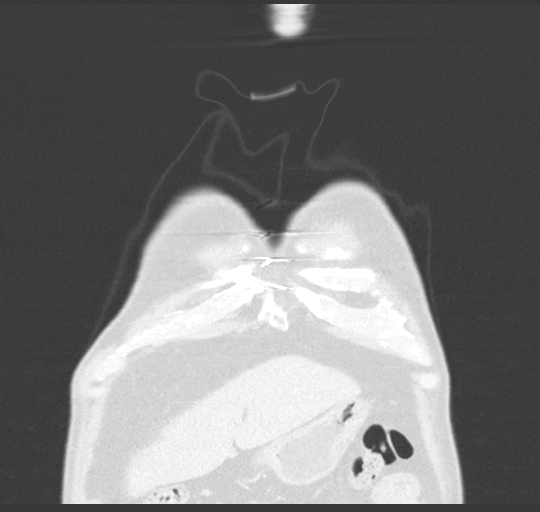
[im 65/161  lung]
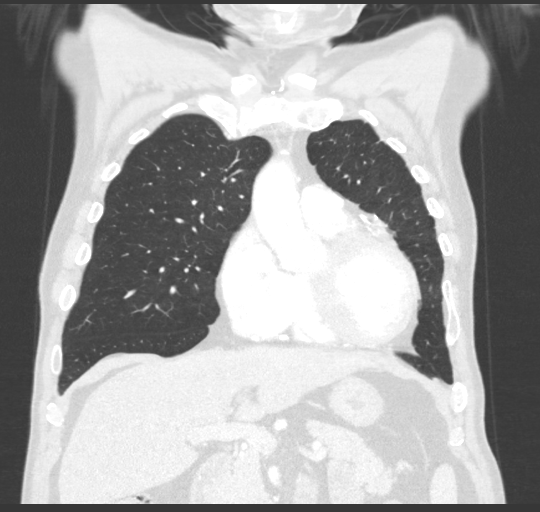
[im 97/161  lung]
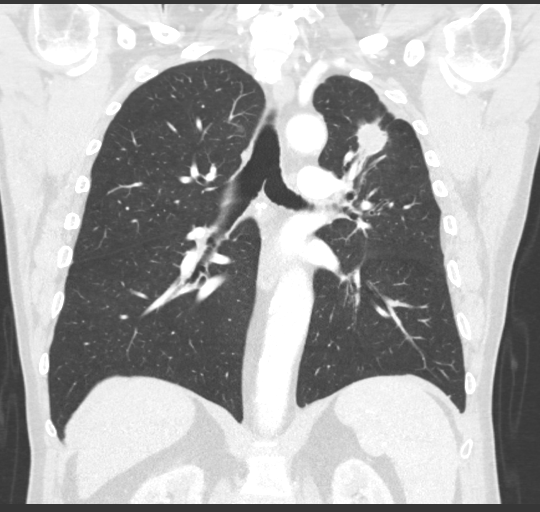

[15 of 36 positions shown; findings below may reference images not displayed]

FINDINGS: Cardiovascular: Coronary artery bypass grafting has been performed.
Extensive calcification is seen within the native coronary arteries.
Global cardiac size is within normal limits. No pericardial
effusion. The central pulmonary arteries are of normal caliber. The
thoracic aorta is dilated in its descending segment measuring 3.5 cm
at the level of the carina. Moderate atherosclerotic calcification
is seen within this segment. Arch vasculature demonstrates normal
anatomic configuration and is widely patent proximally.

Mediastinum/Nodes: No enlarged mediastinal, hilar, or axillary lymph
nodes. Thyroid gland, trachea, and esophagus demonstrate no
significant findings.

Lungs/Pleura: The opacity seen on recent chest radiograph
corresponds to a a 2.4 x 2.5 x 3.1 cm rounded pulmonary mass seen
within the left upper lobe in an area of architectural distortion
with areas of focal traction bronchiolectasis and cystic change.
This may represent tumefactive scarring related to prior infection,
however, an underlying neoplasm, such as a scar carcinoma, could
appear similarly. Several perifissural nodules are seen within the
left lung base. A densely calcified granuloma is seen at the right
lung base. A 1.1 x 1.2 x 2.1 cm nodule is seen at the left lung base
on coronal image # 82/5. No pneumothorax or pleural effusion.
Central airways are widely patent.

Upper Abdomen: Unremarkable

Musculoskeletal: No acute bone abnormality.
IMPRESSION: Opacity noted on prior chest radiograph corresponds to a a 3.1 cm
pulmonary mass within the left upper lobe. An underlying neoplasm is
not excluded and PET CT examination is recommended for further
characterization.

Indeterminate 2.1 cm nodule within the left lung base. This can be
simultaneously assessed with PET CT examination.

3.5 cm descending thoracic aortic aneurysm. Recommend annual imaging
followup by CTA or MRA. This recommendation follows [IT]
ACCF/AHA/AATS/ACR/ASA/SCA/TEJI/TEJI/TEJI/TEJI Guidelines for the
Diagnosis and Management of Patients with Thoracic Aortic Disease.
Circulation.[IT]; 121: [id number]. Aortic aneurysm NOS ([IT]-[IT])

Aortic aneurysm NOS ([IT]-[IT]).

## 2020-04-26 MED ORDER — IOHEXOL 300 MG/ML  SOLN
75.0000 mL | Freq: Once | INTRAMUSCULAR | Status: AC | PRN
  Administered 2020-04-26: 75 mL via INTRAVENOUS

## 2020-04-29 ENCOUNTER — Other Ambulatory Visit (HOSPITAL_COMMUNITY): Payer: Self-pay | Admitting: *Deleted

## 2020-04-30 ENCOUNTER — Telehealth: Payer: Self-pay | Admitting: Pulmonary Disease

## 2020-04-30 DIAGNOSIS — R918 Other nonspecific abnormal finding of lung field: Secondary | ICD-10-CM

## 2020-04-30 NOTE — Telephone Encounter (Signed)
I have left a voicemail for patient to call back for his CT Chest results which shows a 2.4 x 2.5 x 3.1 cm rounded pulmonary mass of the left upper lobe. I also left a message at his work place.   Please schedule him for a PET scan within the next week.   Once I am able to talk with him about the results of the CT scan, we will schedule him for a navigational bronchoscopy with Dr. Valeta Harms.  Thanks, Wille Glaser

## 2020-05-01 NOTE — Telephone Encounter (Signed)
Called pt back and there was no answer- LMTCB

## 2020-05-02 ENCOUNTER — Telehealth: Payer: Self-pay | Admitting: Pulmonary Disease

## 2020-05-02 DIAGNOSIS — R918 Other nonspecific abnormal finding of lung field: Secondary | ICD-10-CM

## 2020-05-02 DIAGNOSIS — Z5181 Encounter for therapeutic drug level monitoring: Secondary | ICD-10-CM

## 2020-05-02 NOTE — Telephone Encounter (Signed)
Spoke with the pt and notified of CT results and need for PET scan  Pt verbalized understanding and agreed to proceed with PET  I have placed order  Pt states does not need a call back from Dr Erin Fulling at this time  Will forward to him to make him aware I was able to make contact with the pt

## 2020-05-02 NOTE — Telephone Encounter (Signed)
PCCM:  Referral from Dr. Erin Fulling for bronchoscopy.   Patient is on plavix and ASA with h/o CAD. No recent stents.   I have asked him to hold his plavix.  I also said I would reach to Dr. [name] from cardiology to let her know.   Planned bronchoscopy next Thursday 10/21  [name], DO [facility] 05/02/2020 6:59 PM

## 2020-05-02 NOTE — Telephone Encounter (Signed)
Pt returning a phone call. Pt can be reached at [phone].

## 2020-05-03 NOTE — Telephone Encounter (Signed)
Labs ordered.

## 2020-05-03 NOTE — Telephone Encounter (Signed)
Pt has been scheduled for ENB/EBUS on 10/21 at 12:30 at [facility], covid test 10/18 at 12:30.  Spoke to [place] at [place] she is going to have disk made.  Gave appt info to pt.  Lauren - labs will need to be ordered.

## 2020-05-03 NOTE — Addendum Note (Signed)
Addended by: Amado Coe on: 05/03/2020 01:56 PM   Modules accepted: Orders

## 2020-05-04 NOTE — Telephone Encounter (Signed)
OK to hold Plavix and ASA for procedure

## 2020-05-06 ENCOUNTER — Other Ambulatory Visit (HOSPITAL_COMMUNITY)
Admission: RE | Admit: 2020-05-06 | Discharge: 2020-05-06 | Disposition: A | Discharge status: Home / Self Care | Payer: Medicare Other | Source: Ambulatory Visit | Attending: Pulmonary Disease | Admitting: Pulmonary Disease

## 2020-05-06 DIAGNOSIS — Z01812 Encounter for preprocedural laboratory examination: Secondary | ICD-10-CM | POA: Diagnosis not present

## 2020-05-06 DIAGNOSIS — Z20822 Contact with and (suspected) exposure to covid-19: Secondary | ICD-10-CM | POA: Diagnosis not present

## 2020-05-06 LAB — SARS CORONAVIRUS 2 (TAT 6-24 HRS): SARS Coronavirus 2: NEGATIVE

## 2020-05-06 NOTE — Telephone Encounter (Signed)
Thank you. Also, please cancel the CPET study that was originally scheduled for the patient. He does not need to undergo this test until we complete the workup for the lung mass.

## 2020-05-07 ENCOUNTER — Other Ambulatory Visit: Payer: Self-pay

## 2020-05-07 ENCOUNTER — Encounter (HOSPITAL_COMMUNITY): Payer: Self-pay | Admitting: Pulmonary Disease

## 2020-05-07 NOTE — Progress Notes (Signed)
Patient denies shortness of breath, fever, cough or chest pain.   PCP - Dr  [name] Cardiologist - Dr Fransico Him Pulmonology - Dr Freda Jackson  Chest x-ray -04/16/20 (2V) EKG - 02/05/20 Stress Test - 02/28/20 ECHO - 02/28/20 Cardiac Cath - 01/29/17  Sleep Study -  Yes CPAP -  Uses cpap nightly  Anesthesia review: Yes  STOP now taking any Aspirin (unless otherwise instructed by your surgeon), Aleve, Naproxen, Ibuprofen, Motrin, Advil, Goody's, BC's, all herbal medications, fish oil, and all vitamins.   Coronavirus Screening Covid test on 05/06/20 was negative.  Patient verbalized understanding of instructions that were given via phone.

## 2020-05-07 NOTE — Telephone Encounter (Signed)
Thanks

## 2020-05-08 NOTE — Progress Notes (Signed)
Anesthesia Chart Review: SAME DAY WORK-UP   Case: [id number] Date/Time: 05/09/20 0900   Procedures:      VIDEO BRONCHOSCOPY WITH ENDOBRONCHIAL NAVIGATION (N/A )     VIDEO BRONCHOSCOPY WITH ENDOBRONCHIAL ULTRASOUND (N/A )   Anesthesia type: General   Pre-op diagnosis: LUNG MASS   Location: [facility] / [place] [facility]   Surgeons: [name], DO      DISCUSSION: Patient is a 74 year old male scheduled for the above procedure. He was referred to pulmonology for exertional dyspnea after cardiac testing did not reveal a definitive cause. CXR showed small bilateral effusion and 4.8 cm LUL lung mass--3.1 cm on CT with indeterminate 2.1 cm LLL nodule and incidental 3.5 cm descending thoracic aortic aneurysm. The above procedure recommended in hopes to obtain tissue diagnosis.   History includes former smoker (quit 07/21/83), HTN, dyslipidemia, CAD (s/p CABG ~ 2007: LIMA-LAD, SVG-OM1-OM2, SVG-PDA, SVG-DIAG; DES to sequential OM graft 04/05/15; s/p cutting balloon angioplasty SVG-OM 01/29/17), ischemic cardiomyopathy, right BBB, bradycardia, dyspnea, OSA (CPAP).  Dr. Valeta Harms reached out to cardiology to confirm okay to hold Plavix and ASA for procedure. Cardiologist Dr. [name] wrote, "OK to hold Plavix and ASA for procedure." He had a non-ischemic stress test 02/2020.   Preoperative COVID-19 test negative on 05/06/2020.  He is a same-day work-up, so labs and anesthesia team evaluation on the day of surgery.   VS: Ht 5\' 11"  (1.803 m)   Wt 95.3 kg   BMI 29.29 kg/m  BP Readings from Last 3 Encounters:  04/16/20 112/70  02/05/20 120/82  12/30/18 (!) 145/82   Pulse Readings from Last 3 Encounters:  04/16/20 60  02/05/20 (!) 59  12/30/18 75    PROVIDERS: [name], MD is PCP Fransico Him, MD is cardiologist Freda Jackson, MD is pulmonologist   LABS: For day of surgery. Currently, last results include: Lab Results  Component Value Date   GLUCOSE 100 (H) 04/23/2020   CHOL 122  02/05/2020   TRIG 75 02/05/2020   HDL 40 02/05/2020   LDLCALC 67 02/05/2020   ALT 22 02/05/2020   AST 24 02/05/2020   NA 137 04/23/2020   K 3.9 04/23/2020   CL 104 04/23/2020   CREATININE 1.07 04/23/2020   BUN 21 04/23/2020   CO2 26 04/23/2020     PFTs 02/29/20: FEV1/FVC 75, Post FEV1 98%, FVC 96%, No significant bronchodilator response. TLC 95%, DLCO 106%   IMAGES: CT Chest 04/26/20: IMPRESSION: - Opacity noted on prior chest radiograph corresponds to a a 3.1 cm pulmonary mass within the left upper lobe. An underlying neoplasm is not excluded and PET CT examination is recommended for further characterization. - Indeterminate 2.1 cm nodule within the left lung base. This can be simultaneously assessed with PET CT examination. - 3.5 cm descending thoracic aortic aneurysm. Recommend annual imaging followup by CTA or MRA. This recommendation follows 2010 ACCF/AHA/AATS/ACR/ASA/SCA/SCAI/SIR/STS/SVM Guidelines for the Diagnosis and Management of Patients with Thoracic Aortic Disease. Circulation.2010; 121: A213-Y865. Aortic aneurysm NOS (ICD10-I71.9) Aortic aneurysm NOS (ICD10-I71.9).   EKG: 02/05/20 ([facility]): Sinus bradycardia 59 bpm.  Occasional PACs.  Right bundle branch block.   CV: Echo 02/28/20: IMPRESSIONS  1. Left ventricular ejection fraction, by estimation, is 55 to 60%. The  left ventricle has normal function. The left ventricle has no regional  wall motion abnormalities. There is mild left ventricular hypertrophy.  Left ventricular diastolic parameters  are consistent with Grade I diastolic dysfunction (impaired relaxation).  The average left ventricular global longitudinal strain  is -17.3 %. The  global longitudinal strain is normal.  2. Right ventricular systolic function is normal. The right ventricular  size is normal. There is normal pulmonary artery systolic pressure.  3. The mitral valve is normal in structure. Mild mitral valve  regurgitation.  No evidence of mitral stenosis.  4. The aortic valve is tricuspid. Aortic valve regurgitation is not  visualized. Mild aortic valve sclerosis is present, with no evidence of  aortic valve stenosis.  5. Aortic dilatation noted. There is mild dilatation of the ascending  aorta measuring 38 mm.  6. The inferior vena cava is normal in size with greater than 50%  respiratory variability, suggesting right atrial pressure of 3 mmHg.   Nuclear stress test 02/28/20:  Nuclear stress EF: 46%.  There was no ST segment deviation noted during stress.  Defect 1: There is a small defect of severe severity present in the basal inferolateral and basal anterolateral location.  Findings consistent with prior myocardial infarction in the left circumfex territory.  This is an intermediate risk study due to reduced systolic function and prior infarct. There is no ischemia.  The left ventricular ejection fraction is mildly decreased (45-54%). [EF 55-60% by 02/28/20 echocardiogram]   Cardiac cath 01/29/17:  Prox RCA lesion, 90 %stenosed.  Mid RCA lesion, 100 %stenosed.  LIMA.  LM lesion, 80 %stenosed.  Prox Cx lesion, 100 %stenosed.  Prox LAD lesion, 100 %stenosed.  The left ventricular systolic function is normal.  LV end diastolic pressure is normal.  The left ventricular ejection fraction is 55-65% by visual estimate.  Dist Graft lesion, 90 %stenosed.  Post intervention, there is a 10% residual stenosis. 1. Severe 3 vessel occlusive CAD 2. Patent LIMA to the LAD 3. Patent SVG to the first diagonal 4. Patent SVG to PDA 5. SVG to OM with severe focal instent restenosis  6. Normal LV function 7. Normal LVEDP 8. Successful Cutting balloon angioplasty of the SVG to OM for in stent restenosis.     Past Medical History:  Diagnosis Date  . Arrhythmia    Post-op  . BPH (benign prostatic hypertrophy)   . Bradycardia    a. H/o asymptomatic bradycardia.  . Coronary artery disease     a. s/p CABG ~2007 with LIMA to LAD, SVG seq to OM1 and OM2, SVG to PDA and SVG to diag. b. Abnormal nuc 03/2015 - s/p DES to SVG-OM1-OM2. 01/29/17 POBA with cutting balloon to SVG-->OM  . DCM (dilated cardiomyopathy) ([place]) 09/17/2015  . Dyslipidemia   . Dyspnea    r/t lung mass  . ED (erectile dysfunction)   . Fear of heights   . Hypertension   . Ischemic cardiomyopathy    a. EF 46% by nuc 03/2015.  Marland Kitchen OSA (obstructive sleep apnea) 07/12/2015   Moderate OSA with AHI 16.5/hr now on CPAP at 8cm H2O, uses cpap nightly     . RBBB   . Shoulder, capsulitis, adhesive    Left Shoulder    Past Surgical History:  Procedure Laterality Date  . CARDIAC CATHETERIZATION N/A 04/05/2015   Procedure: Left Heart Cath and Coronary Angiography;  Surgeon: Sherren Mocha, MD;  Location: [place] [facility];  Service: Cardiovascular;  Laterality: N/A;  . COLONOSCOPY    . CORONARY ARTERY BYPASS GRAFT     w/LIMA to LAD, seq SVG to OM1 and OM2, SVG to PDA and SVG to Diagonal  . CORONARY BALLOON ANGIOPLASTY N/A 01/29/2017   Procedure: Coronary Balloon Angioplasty;  Surgeon: Martinique, Peter M,  MD;  Location: [place] [facility];  Service: Cardiovascular;  Laterality: N/A;  . LEFT HEART CATH AND CORS/GRAFTS ANGIOGRAPHY N/A 01/29/2017   Procedure: Left Heart Cath and Cors/Grafts Angiography;  Surgeon: Martinique, Peter M, MD;  Location: [place] [facility];  Service: Cardiovascular;  Laterality: N/A;    MEDICATIONS: No current facility-administered medications for this encounter.   Marland Kitchen aspirin 81 MG tablet  . cholecalciferol (VITAMIN D) 1000 UNITS tablet  . clopidogrel (PLAVIX) 75 MG tablet  . Cyanocobalamin (B-12 PO)  . doxazosin (CARDURA) 2 MG tablet  . ezetimibe (ZETIA) 10 MG tablet  . folic acid (FOLVITE) 168 MCG tablet  . Multiple Vitamin (MULTIVITAMIN) tablet  . nitroGLYCERIN (NITROSTAT) 0.4 MG SL tablet  . ramipril (ALTACE) 10 MG capsule  . rosuvastatin (CRESTOR) 40 MG tablet  . sertraline (ZOLOFT) 50 MG tablet  .  spironolactone (ALDACTONE) 25 MG tablet    Myra Gianotti, PA-C Surgical Short Stay/Anesthesiology [facility] Phone [phone] [facility] Phone [phone] 05/08/2020 12:57 PM

## 2020-05-08 NOTE — Anesthesia Preprocedure Evaluation (Addendum)
Anesthesia Evaluation  Patient identified by MRN, date of birth, ID band Patient awake    Reviewed: Allergy & Precautions, NPO status , Patient's Chart, lab work & pertinent test results  Airway Mallampati: II  TM Distance: >3 FB Neck ROM: Full    Dental  (+) Teeth Intact, Dental Advisory Given   Pulmonary former smoker,    breath sounds clear to auscultation       Cardiovascular hypertension,  Rhythm:Regular Rate:Normal     Neuro/Psych    GI/Hepatic   Endo/Other    Renal/GU      Musculoskeletal   Abdominal   Peds  Hematology   Anesthesia Other Findings   Reproductive/Obstetrics                            Anesthesia Physical Anesthesia Plan  ASA: III  Anesthesia Plan: General   Post-op Pain Management:    Induction: Intravenous  PONV Risk Score and Plan: Ondansetron and Dexamethasone  Airway Management Planned: Oral ETT  Additional Equipment:   Intra-op Plan:   Post-operative Plan: Extubation in OR  Informed Consent: I have reviewed the patients History and Physical, chart, labs and discussed the procedure including the risks, benefits and alternatives for the proposed anesthesia with the patient or authorized representative who has indicated his/her understanding and acceptance.     Dental advisory given  Plan Discussed with: Anesthesiologist and CRNA  Anesthesia Plan Comments: (PAT note written 05/08/2020 by Myra Gianotti, PA-C. )       Anesthesia Quick Evaluation

## 2020-05-09 ENCOUNTER — Ambulatory Visit (HOSPITAL_COMMUNITY): Payer: Medicare Other

## 2020-05-09 ENCOUNTER — Encounter (HOSPITAL_COMMUNITY)
Admission: RE | Disposition: A | Discharge status: Home / Self Care | Payer: Self-pay | Source: Home / Self Care | Attending: Pulmonary Disease

## 2020-05-09 ENCOUNTER — Ambulatory Visit (HOSPITAL_COMMUNITY)
Admission: RE | Admit: 2020-05-09 | Discharge: 2020-05-09 | Disposition: A | Discharge status: Home / Self Care | Payer: Medicare Other | Attending: Pulmonary Disease | Admitting: Pulmonary Disease

## 2020-05-09 ENCOUNTER — Other Ambulatory Visit: Payer: Self-pay

## 2020-05-09 ENCOUNTER — Ambulatory Visit (HOSPITAL_COMMUNITY): Payer: Medicare Other | Admitting: Vascular Surgery

## 2020-05-09 ENCOUNTER — Encounter (HOSPITAL_COMMUNITY): Payer: Self-pay | Admitting: Pulmonary Disease

## 2020-05-09 DIAGNOSIS — I712 Thoracic aortic aneurysm, without rupture: Secondary | ICD-10-CM | POA: Insufficient documentation

## 2020-05-09 DIAGNOSIS — E785 Hyperlipidemia, unspecified: Secondary | ICD-10-CM | POA: Diagnosis not present

## 2020-05-09 DIAGNOSIS — G4733 Obstructive sleep apnea (adult) (pediatric): Secondary | ICD-10-CM | POA: Diagnosis not present

## 2020-05-09 DIAGNOSIS — I2510 Atherosclerotic heart disease of native coronary artery without angina pectoris: Secondary | ICD-10-CM | POA: Diagnosis not present

## 2020-05-09 DIAGNOSIS — Z87891 Personal history of nicotine dependence: Secondary | ICD-10-CM | POA: Diagnosis not present

## 2020-05-09 DIAGNOSIS — I10 Essential (primary) hypertension: Secondary | ICD-10-CM | POA: Insufficient documentation

## 2020-05-09 DIAGNOSIS — R918 Other nonspecific abnormal finding of lung field: Secondary | ICD-10-CM

## 2020-05-09 DIAGNOSIS — C3412 Malignant neoplasm of upper lobe, left bronchus or lung: Secondary | ICD-10-CM | POA: Insufficient documentation

## 2020-05-09 DIAGNOSIS — Z951 Presence of aortocoronary bypass graft: Secondary | ICD-10-CM | POA: Insufficient documentation

## 2020-05-09 DIAGNOSIS — Z9889 Other specified postprocedural states: Secondary | ICD-10-CM

## 2020-05-09 DIAGNOSIS — R911 Solitary pulmonary nodule: Secondary | ICD-10-CM | POA: Diagnosis present

## 2020-05-09 DIAGNOSIS — I517 Cardiomegaly: Secondary | ICD-10-CM | POA: Diagnosis not present

## 2020-05-09 HISTORY — PX: VIDEO BRONCHOSCOPY WITH ENDOBRONCHIAL NAVIGATION: SHX6175

## 2020-05-09 HISTORY — PX: BRONCHIAL WASHINGS: SHX5105

## 2020-05-09 HISTORY — DX: Dyspnea, unspecified: R06.00

## 2020-05-09 HISTORY — PX: BRONCHIAL NEEDLE ASPIRATION BIOPSY: SHX5106

## 2020-05-09 HISTORY — PX: VIDEO BRONCHOSCOPY WITH ENDOBRONCHIAL ULTRASOUND: SHX6177

## 2020-05-09 HISTORY — PX: FIDUCIAL MARKER PLACEMENT: SHX6858

## 2020-05-09 HISTORY — PX: BRONCHIAL BIOPSY: SHX5109

## 2020-05-09 HISTORY — PX: BRONCHIAL BRUSHINGS: SHX5108

## 2020-05-09 LAB — COMPREHENSIVE METABOLIC PANEL
ALT: 24 U/L (ref 0–44)
AST: 26 U/L (ref 15–41)
Albumin: 4.4 g/dL (ref 3.5–5.0)
Alkaline Phosphatase: 58 U/L (ref 38–126)
Anion gap: 11 (ref 5–15)
BUN: 21 mg/dL (ref 8–23)
CO2: 24 mmol/L (ref 22–32)
Calcium: 9.3 mg/dL (ref 8.9–10.3)
Chloride: 105 mmol/L (ref 98–111)
Creatinine, Ser: 0.96 mg/dL (ref 0.61–1.24)
GFR, Estimated: >60 mL/min (ref >60)
Glucose, Bld: 108 mg/dL — ABNORMAL HIGH (ref 70–99)
Potassium: 3.9 mmol/L (ref 3.5–5.1)
Sodium: 140 mmol/L (ref 135–145)
Total Bilirubin: 0.7 mg/dL (ref 0.3–1.2)
Total Protein: 7.3 g/dL (ref 6.5–8.1)

## 2020-05-09 LAB — CBC
HCT: 41.3 % (ref 39.0–52.0)
Hemoglobin: 13.7 g/dL (ref 13.0–17.0)
MCH: 33.3 pg (ref 26.0–34.0)
MCHC: 33.2 g/dL (ref 30.0–36.0)
MCV: 100.2 fL — ABNORMAL HIGH (ref 80.0–100.0)
Platelets: 152 10*3/uL (ref 150–400)
RBC: 4.12 MIL/uL — ABNORMAL LOW (ref 4.22–5.81)
RDW: 12.8 % (ref 11.5–15.5)
WBC: 5.4 10*3/uL (ref 4.0–10.5)
nRBC: 0 % (ref 0.0–0.2)

## 2020-05-09 LAB — PROTIME-INR
INR: 1 (ref 0.8–1.2)
Prothrombin Time: 12.7 seconds (ref 11.4–15.2)

## 2020-05-09 LAB — APTT: aPTT: 30 seconds (ref 24–36)

## 2020-05-09 IMAGING — DX DG CHEST 1V PORT
1 series · 1 of 1 positions shown · non-contrast
Comparison: [DATE] chest radiograph; chest CT [DATE]

CLINICAL DATA: Status post bronchoscopy and biopsy

EXAM:
PORTABLE CHEST 1 VIEW

[chest ap]
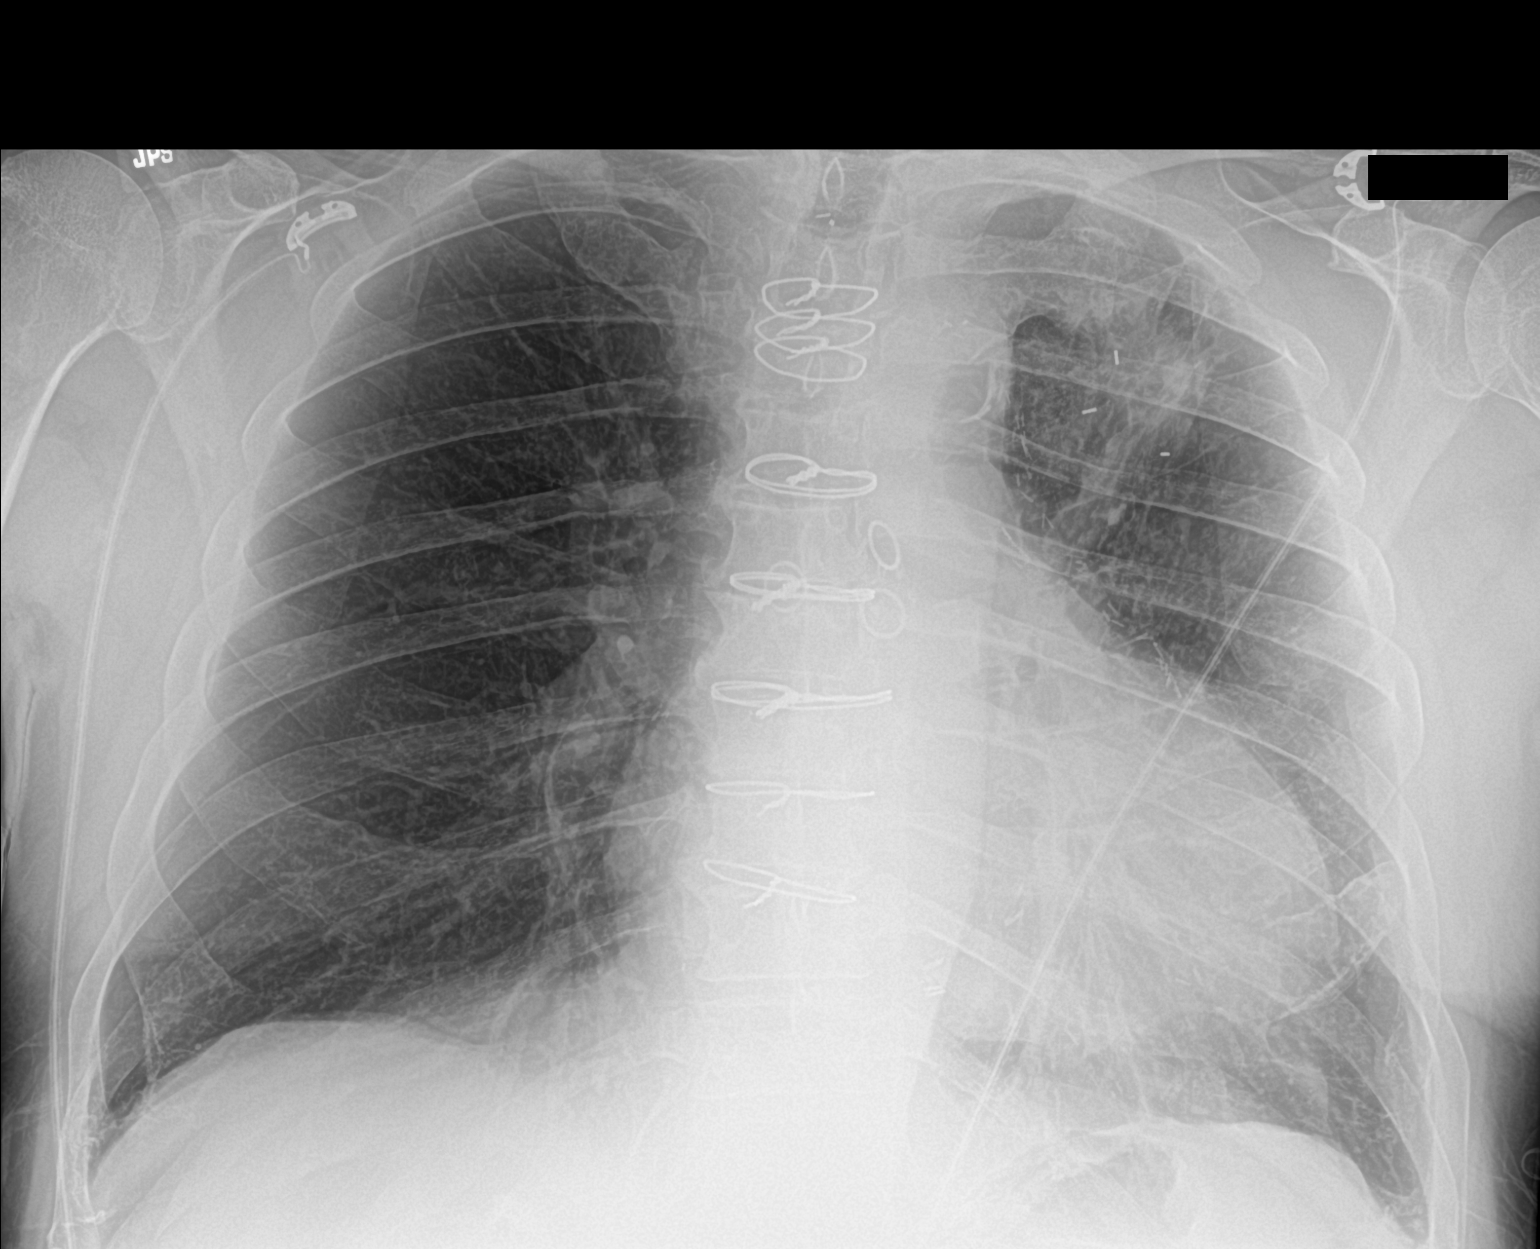

[1 of 1 positions shown; findings below may reference images not displayed]

FINDINGS: No pneumothorax. Ill-defined airspace opacity in the left upper lobe
at the site of known mass is similar in appearance. There are clips
in this area currently. Elsewhere the lungs are clear. The heart is
mildly enlarged with pulmonary vascularity normal. No adenopathy.
There is aortic atherosclerosis. Patient is status post coronary
artery bypass grafting.
IMPRESSION: No pneumothorax. Airspace opacity in the left upper lobe at the site
of mass with surrounding pneumonitis appears essentially stable.
Surgical clips now present in this area.

No new opacity evident. Note that a nodular lesions seen in the left
base on recent CT is not appreciable on this portable radiographic
examination. Stable cardiomegaly. Status post coronary artery bypass
grafting.

Aortic Atherosclerosis ([EE]-[EE]).

## 2020-05-09 SURGERY — VIDEO BRONCHOSCOPY WITH ENDOBRONCHIAL NAVIGATION
Anesthesia: General

## 2020-05-09 MED ORDER — ROCURONIUM BROMIDE 10 MG/ML (PF) SYRINGE
PREFILLED_SYRINGE | INTRAVENOUS | Status: DC | PRN
  Administered 2020-05-09: 60 mg via INTRAVENOUS

## 2020-05-09 MED ORDER — SUGAMMADEX SODIUM 200 MG/2ML IV SOLN
INTRAVENOUS | Status: DC | PRN
  Administered 2020-05-09: 400 mg via INTRAVENOUS

## 2020-05-09 MED ORDER — CHLORHEXIDINE GLUCONATE 0.12 % MT SOLN
15.0000 mL | Freq: Once | OROMUCOSAL | Status: AC
  Filled 2020-05-09: qty 15

## 2020-05-09 MED ORDER — CHLORHEXIDINE GLUCONATE 0.12 % MT SOLN
OROMUCOSAL | Status: AC
  Administered 2020-05-09: 15 mL via OROMUCOSAL
  Filled 2020-05-09: qty 15

## 2020-05-09 MED ORDER — LIDOCAINE 2% (20 MG/ML) 5 ML SYRINGE
INTRAMUSCULAR | Status: DC | PRN
  Administered 2020-05-09: 50 mg via INTRAVENOUS

## 2020-05-09 MED ORDER — ONDANSETRON HCL 4 MG/2ML IJ SOLN
INTRAMUSCULAR | Status: DC | PRN
  Administered 2020-05-09: 4 mg via INTRAVENOUS

## 2020-05-09 MED ORDER — DEXMEDETOMIDINE (PRECEDEX) IN NS 20 MCG/5ML (4 MCG/ML) IV SYRINGE
PREFILLED_SYRINGE | INTRAVENOUS | Status: DC | PRN
  Administered 2020-05-09: 12 ug via INTRAVENOUS

## 2020-05-09 MED ORDER — PROPOFOL 10 MG/ML IV BOLUS
INTRAVENOUS | Status: DC | PRN
  Administered 2020-05-09: 170 mg via INTRAVENOUS

## 2020-05-09 MED ORDER — EPHEDRINE SULFATE-NACL 50-0.9 MG/10ML-% IV SOSY
PREFILLED_SYRINGE | INTRAVENOUS | Status: DC | PRN
  Administered 2020-05-09 (×2): 10 mg via INTRAVENOUS
  Administered 2020-05-09 (×2): 5 mg via INTRAVENOUS

## 2020-05-09 MED ORDER — FENTANYL CITRATE (PF) 250 MCG/5ML IJ SOLN
INTRAMUSCULAR | Status: DC | PRN
  Administered 2020-05-09: 100 ug via INTRAVENOUS

## 2020-05-09 MED ORDER — LACTATED RINGERS IV SOLN: INTRAVENOUS | Status: DC

## 2020-05-09 MED ORDER — GLYCOPYRROLATE PF 0.2 MG/ML IJ SOSY
PREFILLED_SYRINGE | INTRAMUSCULAR | Status: DC | PRN
  Administered 2020-05-09: .1 mg via INTRAVENOUS
  Administered 2020-05-09: .2 mg via INTRAVENOUS

## 2020-05-09 SURGICAL SUPPLY — 55 items
ADAPTER BRONCH F/PENTAX (ADAPTER) ×4 IMPLANT
ADAPTER VALVE BIOPSY EBUS (MISCELLANEOUS) IMPLANT
ADPR BSCP EDG PNTX (ADAPTER) ×2
ADPTR VALVE BIOPSY EBUS (MISCELLANEOUS)
BRUSH CYTOL CELLEBRITY 1.5X140 (MISCELLANEOUS) ×4 IMPLANT
BRUSH SUPERTRAX BIOPSY (INSTRUMENTS) IMPLANT
BRUSH SUPERTRAX NDL-TIP CYTO (INSTRUMENTS) ×4 IMPLANT
CANISTER SUCT 3000ML PPV (MISCELLANEOUS) ×4 IMPLANT
CHANNEL WORK EXTEND EDGE 180 (KITS) IMPLANT
CHANNEL WORK EXTEND EDGE 45 (KITS) IMPLANT
CHANNEL WORK EXTEND EDGE 90 (KITS) IMPLANT
CONT SPEC 4OZ CLIKSEAL STRL BL (MISCELLANEOUS) ×4 IMPLANT
COVER BACK TABLE 60X90IN (DRAPES) ×4 IMPLANT
COVER DOME SNAP 22 D (MISCELLANEOUS) ×4 IMPLANT
FILTER STRAW FLUID ASPIR (MISCELLANEOUS) IMPLANT
FORCEPS BIOP RJ4 1.8 (CUTTING FORCEPS) IMPLANT
FORCEPS BIOP SUPERTRX PREMAR (INSTRUMENTS) ×4 IMPLANT
GAUZE SPONGE 4X4 12PLY STRL (GAUZE/BANDAGES/DRESSINGS) ×4 IMPLANT
GLOVE BIO SURGEON STRL SZ7.5 (GLOVE) ×4 IMPLANT
GLOVE SURG SS PI 7.5 STRL IVOR (GLOVE) ×8 IMPLANT
GOWN STRL REUS W/ TWL LRG LVL3 (GOWN DISPOSABLE) ×4 IMPLANT
GOWN STRL REUS W/TWL LRG LVL3 (GOWN DISPOSABLE) ×8
KIT CLEAN ENDO COMPLIANCE (KITS) ×8 IMPLANT
KIT LOCATABLE GUIDE (CANNULA) IMPLANT
KIT MARKER FIDUCIAL DELIVERY (KITS) IMPLANT
KIT PROCEDURE EDGE 180 (KITS) IMPLANT
KIT PROCEDURE EDGE 45 (KITS) IMPLANT
KIT PROCEDURE EDGE 90 (KITS) IMPLANT
KIT TURNOVER KIT B (KITS) ×4 IMPLANT
MARKER SKIN DUAL TIP RULER LAB (MISCELLANEOUS) ×4 IMPLANT
NDL EBUS SONO TIP PENTAX (NEEDLE) ×2 IMPLANT
NDL SUPERTRX PREMARK BIOPSY (NEEDLE) ×2 IMPLANT
NEEDLE EBUS SONO TIP PENTAX (NEEDLE) ×4 IMPLANT
NEEDLE SUPERTRX PREMARK BIOPSY (NEEDLE) ×4 IMPLANT
NS IRRIG 1000ML POUR BTL (IV SOLUTION) ×4 IMPLANT
OIL SILICONE PENTAX (PARTS (SERVICE/REPAIRS)) ×4 IMPLANT
PAD ARMBOARD 7.5X6 YLW CONV (MISCELLANEOUS) ×8 IMPLANT
PATCHES PATIENT (LABEL) ×12 IMPLANT
SOL ANTI FOG 6CC (MISCELLANEOUS) ×2 IMPLANT
SOLUTION ANTI FOG 6CC (MISCELLANEOUS) ×2
SYR 20CC LL (SYRINGE) ×8 IMPLANT
SYR 20ML ECCENTRIC (SYRINGE) ×8 IMPLANT
SYR 50ML SLIP (SYRINGE) ×4 IMPLANT
SYR 5ML LUER SLIP (SYRINGE) ×4 IMPLANT
SuperLock Fiducial Marker ×6 IMPLANT
TOWEL OR 17X24 6PK STRL BLUE (TOWEL DISPOSABLE) ×4 IMPLANT
TRAP SPECIMEN MUCOUS 40CC (MISCELLANEOUS) IMPLANT
TUBE CONNECTING 20'X1/4 (TUBING) ×2
TUBE CONNECTING 20X1/4 (TUBING) ×6 IMPLANT
UNDERPAD 30X30 (UNDERPADS AND DIAPERS) ×4 IMPLANT
VALVE BIOPSY  SINGLE USE (MISCELLANEOUS) ×4
VALVE BIOPSY SINGLE USE (MISCELLANEOUS) ×2 IMPLANT
VALVE DISPOSABLE (MISCELLANEOUS) ×4 IMPLANT
VALVE SUCTION BRONCHIO DISP (MISCELLANEOUS) ×4 IMPLANT
WATER STERILE IRR 1000ML POUR (IV SOLUTION) ×4 IMPLANT

## 2020-05-09 NOTE — Anesthesia Procedure Notes (Signed)
Procedure Name: Intubation Date/Time: 05/09/2020 9:55 AM Performed by: Valda Favia, CRNA Pre-anesthesia Checklist: Patient identified, Emergency Drugs available, Suction available and Patient being monitored Patient Re-evaluated:Patient Re-evaluated prior to induction Oxygen Delivery Method: Circle System Utilized Preoxygenation: Pre-oxygenation with 100% oxygen Induction Type: IV induction Ventilation: Mask ventilation without difficulty Laryngoscope Size: Mac and 4 Grade View: Grade I Tube type: Oral Tube size: 8.5 mm Number of attempts: 1 Airway Equipment and Method: Stylet and Oral airway Placement Confirmation: ETT inserted through vocal cords under direct vision,  positive ETCO2 and breath sounds checked- equal and bilateral Secured at: 22 cm Tube secured with: Tape Dental Injury: Teeth and Oropharynx as per pre-operative assessment

## 2020-05-09 NOTE — Transfer of Care (Signed)
Immediate Anesthesia Transfer of Care Note  Patient: John Hinton  Procedure(s) Performed: VIDEO BRONCHOSCOPY WITH ENDOBRONCHIAL NAVIGATION (N/A ) VIDEO BRONCHOSCOPY WITH ENDOBRONCHIAL ULTRASOUND (N/A ) BRONCHIAL BRUSHINGS BRONCHIAL BIOPSIES BRONCHIAL NEEDLE ASPIRATION BIOPSIES FIDUCIAL MARKER PLACEMENT BRONCHIAL WASHINGS  Patient Location: PACU  Anesthesia Type:General  Level of Consciousness: awake, alert  and oriented  Airway & Oxygen Therapy: Patient Spontanous Breathing and Patient connected to nasal cannula oxygen  Post-op Assessment: Report given to RN and Post -op Vital signs reviewed and stable  Post vital signs: Reviewed and stable  Last Vitals:  Vitals Value Taken Time  BP 138/81 05/09/20 1110  Temp    Pulse 70 05/09/20 1114  Resp 14 05/09/20 1114  SpO2 95 % 05/09/20 1114  Vitals shown include unvalidated device data.  Last Pain:  Vitals:   05/09/20 0714  TempSrc:   PainSc: 0-No pain      Patients Stated Pain Goal: 2 (72/09/47 0962)  Complications: No complications documented.

## 2020-05-09 NOTE — Interval H&P Note (Signed)
History and Physical Interval Note:  05/09/2020 9:37 AM  John Hinton  has presented today for surgery, with the diagnosis of LUNG MASS.  The various methods of treatment have been discussed with the patient and family. After consideration of risks, benefits and other options for treatment, the patient has consented to  Procedure(s): VIDEO BRONCHOSCOPY WITH ENDOBRONCHIAL NAVIGATION (N/A) VIDEO BRONCHOSCOPY WITH ENDOBRONCHIAL ULTRASOUND (N/A) as a surgical intervention.  The patient's history has been reviewed, patient examined, no change in status, stable for surgery.  I have reviewed the patient's chart and labs.  Questions were answered to the patient's satisfaction.    Discussed risk, benefits, alternatives. Patient is agreeable to proceed. We discussed risks of bleeding. Patient has been off plavix for 7 days.    [place]

## 2020-05-09 NOTE — Anesthesia Postprocedure Evaluation (Signed)
Anesthesia Post Note  Patient: John Hinton  Procedure(s) Performed: VIDEO BRONCHOSCOPY WITH ENDOBRONCHIAL NAVIGATION (N/A ) VIDEO BRONCHOSCOPY WITH ENDOBRONCHIAL ULTRASOUND (N/A ) BRONCHIAL BRUSHINGS BRONCHIAL BIOPSIES BRONCHIAL NEEDLE ASPIRATION BIOPSIES FIDUCIAL MARKER PLACEMENT BRONCHIAL WASHINGS     Patient location during evaluation: Endoscopy Anesthesia Type: General Level of consciousness: awake and alert Pain management: pain level controlled Vital Signs Assessment: post-procedure vital signs reviewed and stable Respiratory status: spontaneous breathing, nonlabored ventilation, respiratory function stable and patient connected to nasal cannula oxygen Cardiovascular status: blood pressure returned to baseline and stable Postop Assessment: no apparent nausea or vomiting Anesthetic complications: no   No complications documented.  Last Vitals:  Vitals:   05/09/20 1115 05/09/20 1130  BP: 117/77 122/78  Pulse: 69 65  Resp: 15 12  Temp:  36.7 C  SpO2: 96% 95%    Last Pain:  Vitals:   05/09/20 1130  TempSrc:   PainSc: 0-No pain                 Erico Stan COKER

## 2020-05-09 NOTE — Op Note (Signed)
Video Bronchoscopy with Electromagnetic Navigation Procedure Note Video Bronchoscopy with Endobronchial Ultrasound Procedure Note  Date of Operation: 05/09/2020  Pre-op Diagnosis: Left upper lobe lung nodule  Post-op Diagnosis: Left upper lobe lung nodule  Surgeon: [name], DO  Assistants: none   Anesthesia: General endotracheal anesthesia  Operation: Flexible video fiberoptic bronchoscopy with electromagnetic navigation and biopsies.  Estimated Blood Loss: Minimal, <5TD  Complications: none   Indications and History: John Hinton is a 74 y.o. male with left upper lobe lung nodule.  The risks, benefits, complications, treatment options and expected outcomes were discussed with the patient.  The possibilities of pneumothorax, pneumonia, reaction to medication, pulmonary aspiration, perforation of a viscus, bleeding, failure to diagnose a condition and creating a complication requiring transfusion or operation were discussed with the patient who freely signed the consent.    Description of Procedure: The patient was seen in the Preoperative Area, was examined and was deemed appropriate to proceed.  The patient was taken to [place] [facility], identified as John Hinton and the procedure verified as Flexible Video Fiberoptic Bronchoscopy.  A Time Out was held and the above information confirmed.   Prior to the date of the procedure a high-resolution CT scan of the chest was performed. Utilizing Fuig a virtual tracheobronchial tree was generated to allow the creation of distinct navigation pathways to the patient's parenchymal abnormalities. After being taken to the operating room general anesthesia was initiated and the patient  was orally intubated. The video fiberoptic bronchoscope was introduced via the endotracheal tube and a general inspection was performed which showed normal right and left lung anatomy, no evidence of endobronchial lesion, scattered  distal bronchial pitting.   Target #1 left upper lobe lung nodule: The extendable working channel and locator guide were introduced into the bronchoscope. The distinct navigation pathways prepared prior to this procedure were then utilized to navigate to within 0.8cm of patient's lesion(s) identified on CT scan.  A full fluoroscopic sweep was obtained for local registration, inspiratory breath-hold APL 20, from RAO 25 degrees to LAO 25 degrees. The extendable working channel was secured into place and the locator guide was withdrawn. Under fluoroscopic guidance transbronchial needle brushings, transbronchial Wang needle biopsies, and transbronchial forceps biopsies were performed to be sent for cytology and pathology.  Following tissue sampling the fiducial delivery catheter was advanced into the airway, 3 triangulated positions around the lesion in separate axial planes at approximately 2 cm away from lesion center.  3 separate fiducial markers were placed under fluoroscopic guidance with advancement of introductory wire through a fiducial delivery catheter.  A bronchioalveolar lavage was performed in the left upper lobe posterior segment and sent for cytology.  Endobronchial ultrasound evaluation: The standard scope was then withdrawn and the endobronchial ultrasound was used to identify and characterize the peritracheal, hilar and bronchial lymph nodes. Inspection showed no evidence of left hilar adenopathy, station 7 was evaluated which appeared to be more of connective tissue with no clear nodal structure, nothing identified in the right hilum. The patient tolerated the procedure well without apparent complications. The bronchoscope was withdrawn.   At the end of the procedure a general airway inspection was performed and there was no evidence of active bleeding.  The therapeutic bronchoscope was inserted into the patient's airway and therapeutic aspiration of the bilateral mainstem's and left upper  lobe segment was completed with removal of any remaining debris and blood clots.  The bilateral mainstem's were clear at the termination of the  procedure.  There was no evidence of active bleeding and the scope was brought to just above the main carina.  The bronchoscope was removed.  The patient tolerated the procedure well. There was no significant blood loss and there were no obvious complications. A post-procedural chest x-ray is pending.  Samples: 1. Transbronchial needle brushings from left upper lobe 2. Transbronchial Wang needle biopsies from left upper lobe 3. Transbronchial forceps biopsies from left upper lobe  4. Bronchoalveolar lavage from left upper lobe  Plans:  The patient will be discharged from the PACU to home when recovered from anesthesia and after chest x-ray is reviewed. We will review the cytology, pathology results with the patient when they become available. Outpatient followup will be with [name], DO or Madie Reno, MD.   [name], DO [facility] 05/09/2020 11:05 AM

## 2020-05-10 LAB — CYTOLOGY - NON PAP

## 2020-05-10 NOTE — Telephone Encounter (Signed)
I called Central Scheduling & canceled PET.  Nothing further needed.

## 2020-05-10 NOTE — Telephone Encounter (Signed)
Called and spoke with patient and advised him that we were instructed to cancel the CPET scan until the work up for the lung mass was complete.  Patient verbalized understanding.  PCCs: Can you please cancel the PET scan that has been scheduled for this patient?  He states it has already been scheduled.  Thank you.

## 2020-05-13 ENCOUNTER — Telehealth: Payer: Self-pay | Admitting: Pulmonary Disease

## 2020-05-13 DIAGNOSIS — Z23 Encounter for immunization: Secondary | ICD-10-CM | POA: Diagnosis not present

## 2020-05-13 DIAGNOSIS — R918 Other nonspecific abnormal finding of lung field: Secondary | ICD-10-CM

## 2020-05-13 NOTE — Telephone Encounter (Signed)
PCCM:   PI called and discussed pathology results with patient. Please re-schedule NM PET scan (ASAP) for the same day that he had if possible it was inadvertently cancelled. He is seeing surgery this Friday for evaluation.   Thanks  [name], DO [facility] 05/13/2020 11:53 AM

## 2020-05-13 NOTE — Telephone Encounter (Signed)
Yes, he is ok to get his booster vaccine.

## 2020-05-13 NOTE — Telephone Encounter (Signed)
I have re-ordered the PET scan and asked that it be done 05/14/20 when it was originally set up for.

## 2020-05-13 NOTE — Addendum Note (Signed)
Addended by: Rosana Berger on: 05/13/2020 12:22 PM   Modules accepted: Orders

## 2020-05-13 NOTE — Telephone Encounter (Signed)
Dr Erin Fulling, please advise on pt email:  Hi, is it ok to get my COVID booster now ?  Had Biopsy yesterday 10/21.

## 2020-05-14 ENCOUNTER — Ambulatory Visit (HOSPITAL_COMMUNITY)
Admission: RE | Admit: 2020-05-14 | Discharge: 2020-05-14 | Disposition: A | Discharge status: Home / Self Care | Payer: Medicare Other | Source: Ambulatory Visit | Attending: Pulmonary Disease | Admitting: Pulmonary Disease

## 2020-05-14 ENCOUNTER — Other Ambulatory Visit: Payer: Self-pay

## 2020-05-14 ENCOUNTER — Encounter (HOSPITAL_COMMUNITY): Payer: Medicare Other

## 2020-05-14 DIAGNOSIS — R918 Other nonspecific abnormal finding of lung field: Secondary | ICD-10-CM | POA: Diagnosis not present

## 2020-05-14 DIAGNOSIS — I2510 Atherosclerotic heart disease of native coronary artery without angina pectoris: Secondary | ICD-10-CM | POA: Insufficient documentation

## 2020-05-14 DIAGNOSIS — I708 Atherosclerosis of other arteries: Secondary | ICD-10-CM | POA: Diagnosis not present

## 2020-05-14 DIAGNOSIS — I6523 Occlusion and stenosis of bilateral carotid arteries: Secondary | ICD-10-CM | POA: Diagnosis not present

## 2020-05-14 DIAGNOSIS — D7389 Other diseases of spleen: Secondary | ICD-10-CM | POA: Diagnosis not present

## 2020-05-14 LAB — GLUCOSE, CAPILLARY: Glucose-Capillary: 105 mg/dL — ABNORMAL HIGH (ref 70–99)

## 2020-05-14 IMAGING — PT NM PET TUM IMG INITIAL (PI) SKULL BASE T - THIGH
8 series · 21 of 25 positions shown · non-contrast
Comparison: Chest CT [DATE]

CLINICAL DATA: Initial treatment strategy for left upper lobe lung
mass.

EXAM:
NUCLEAR MEDICINE PET SKULL BASE TO THIGH
TECHNIQUE: 10.5 mCi F-18 FDG was injected intravenously. Full-ring PET imaging
was performed from the skull base to thigh after the radiotracer. CT
data was obtained and used for attenuation correction and anatomic
localization.
Fasting blood glucose: 105 mg/dl

[Series 3: pet sk_thigh ac · axial · 5.0mm · 4.07mm/px · z∈[-1159,-215]mm · 4 of 237 slices shown]
[im 1/237]
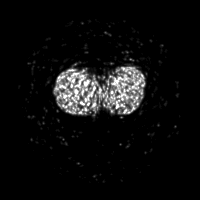
[im 60/237]
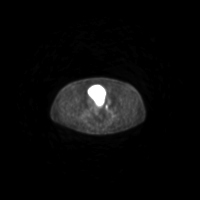
[im 119/237]
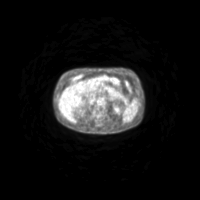
[im 237/237]
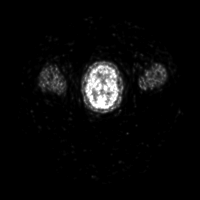

[Series 4: ct sk_thigh 5.0 bf37 · axial · 5.0mm · 0.98mm/px · z∈[-1159,-451]mm · 4 of 237 slices shown]
[im 1/237]
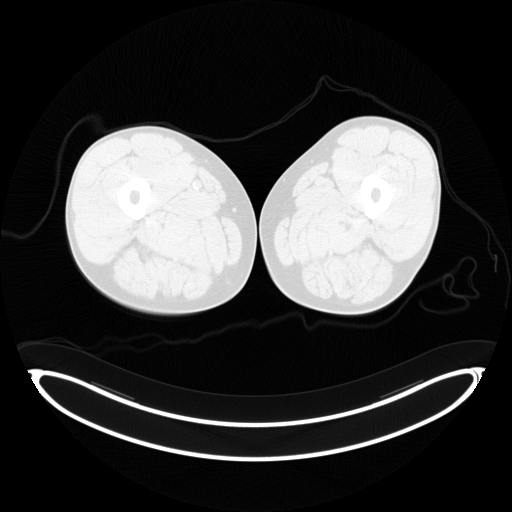
[im 60/237]
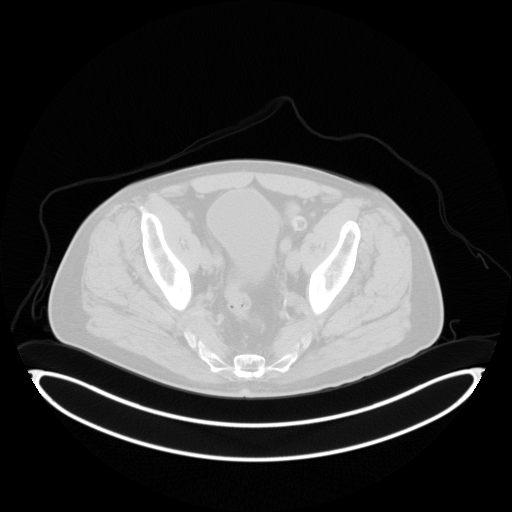
[im 119/237]
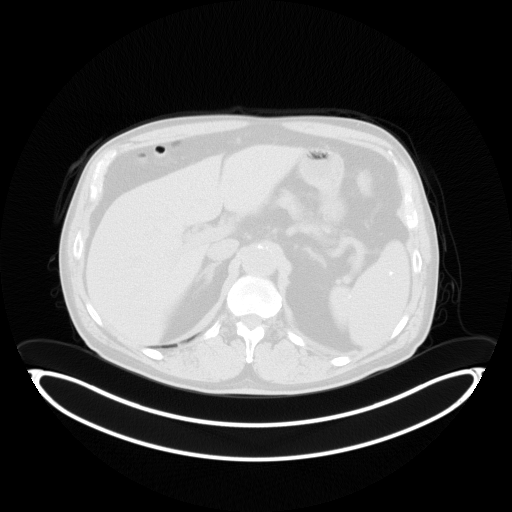
[im 178/237]
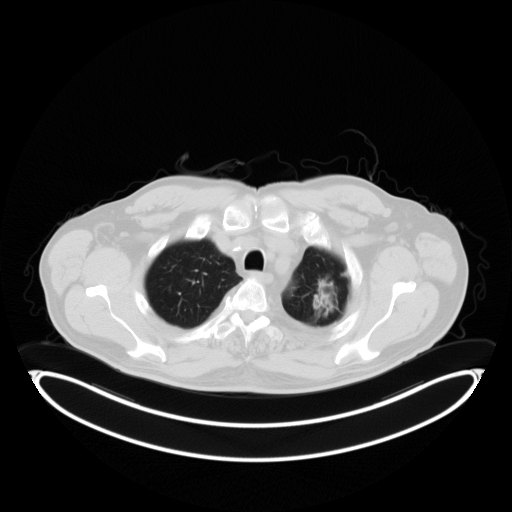

[Series 5: pet sk_thigh nac · axial · 5.0mm · 4.07mm/px · z∈[-1159,-215]mm · 5 of 237 slices shown]
[im 1/237]
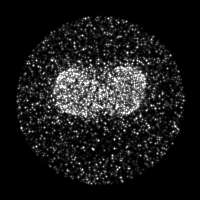
[im 60/237]
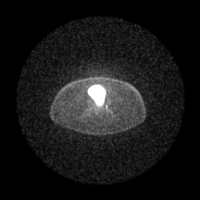
[im 119/237]
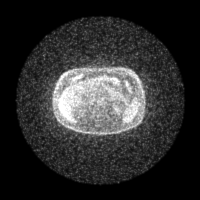
[im 178/237]
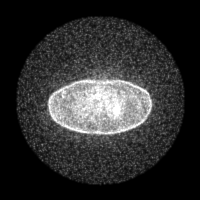
[im 237/237]
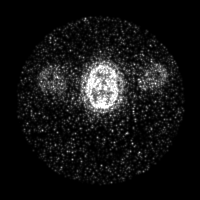

[Series 8: ct sk_thigh 5.0 br59 lung_bone · axial · 5.0mm · 0.73mm/px · 1 of 75 slices shown]
[im 75/75]
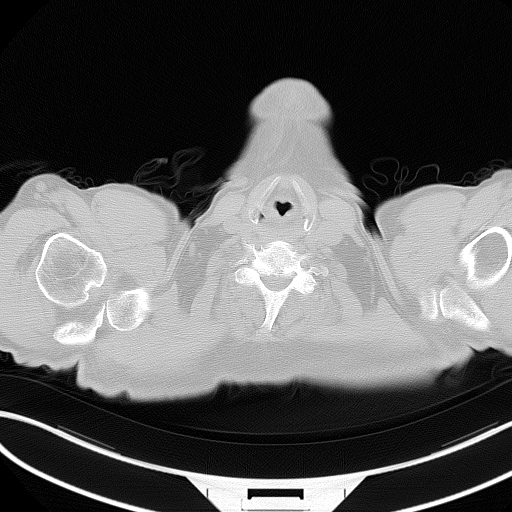

[Series 603: fused cor · 1 of 38 slices shown]
[im 1/38]
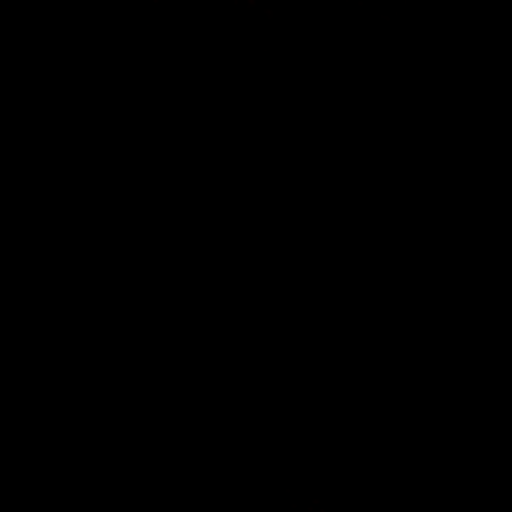

[Series 604: <mip collection> · coronal · 1.96mm/px · 1 of 32 slices shown]
[im 1/32]
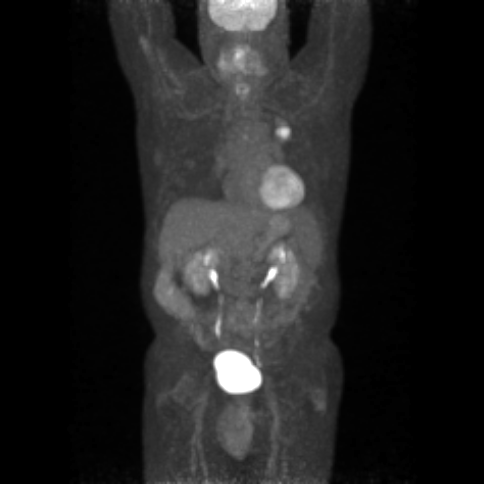

[Series 605: range-ct sk_thigh 5.0 bf37-tra-<alpha range> · 4 of 226 slices shown]
[im 1/226]
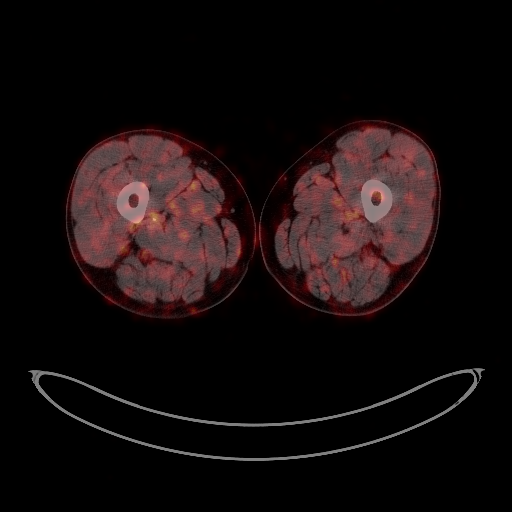
[im 57/226]
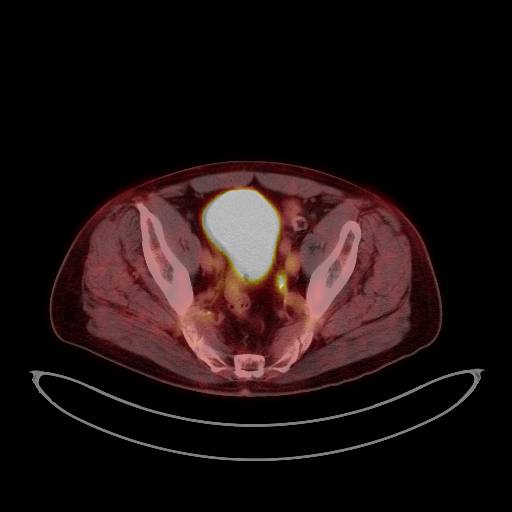
[im 169/226]
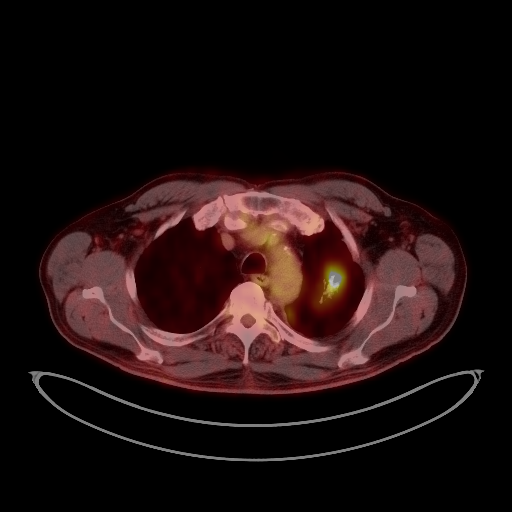
[im 226/226]
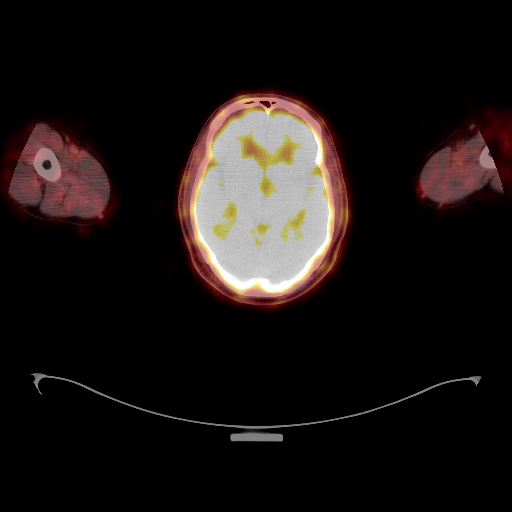

[Series 1095: results mm oncology reading · 1.0mm · 0.50mm/px · 1 of 2 slices shown]
[im 1/2]
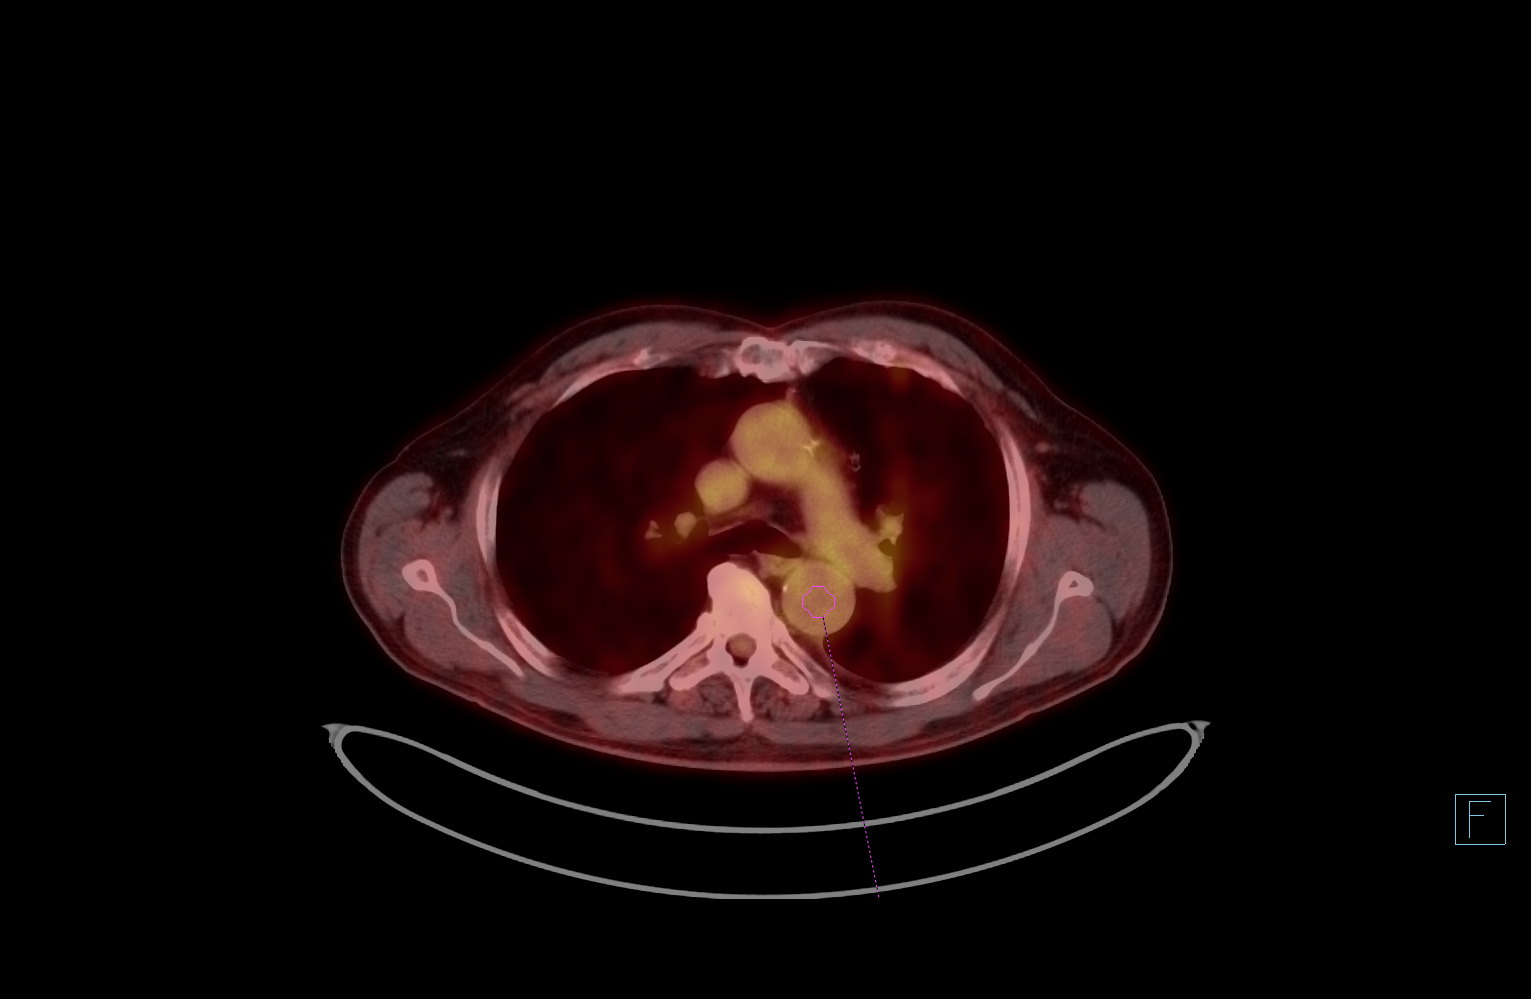

[21 of 25 positions shown; findings below may reference images not displayed]

FINDINGS: Mediastinal blood pool activity: SUV max

Liver activity: SUV max NA

NECK: No hypermetabolic lymph nodes in the neck.

Incidental CT findings: Bilateral carotid artery calcifications
noted.

CHEST: 2.8 cm left upper lobe lung mass is hypermetabolic and
consistent with primary lung neoplasm. SUV max is 15.40.

No enlarged or hypermetabolic mediastinal or hilar lymph nodes to
suggest metastatic disease. Scattered calcified mediastinal and
hilar lymph nodes are noted and likely due to remote granulomatous
disease.

No chest wall mass, supraclavicular or axillary adenopathy.

No other worrisome pulmonary lesions are identified. Calcified
granulomas are noted at the right lung base.

Incidental CT findings: Fiducials are noted around the left upper
lobe lung mass.

Advanced aortic and coronary artery calcifications and evidence of
prior coronary artery bypass surgery.

ABDOMEN/PELVIS: No abnormal hypermetabolic activity within the
liver, pancreas, adrenal glands, or spleen. No hypermetabolic lymph
nodes in the abdomen or pelvis.

Incidental CT findings: Advanced atherosclerotic calcifications
involving abdominal aorta iliac arteries. No aneurysm.

Scattered calcified granulomas in the liver and spleen.

Hazy/misty appearance of the small bowel mesentery likely due to
benign mesenteritis or panniculitis.

Small periumbilical abdominal wall hernia containing fat.

SKELETON: No focal hypermetabolic activity to suggest skeletal
metastasis.

Incidental CT findings: No significant findings.
IMPRESSION: 1. Hypermetabolic 2.8 cm left upper lobe lung mass consistent with
primary lung neoplasm.
2. No mediastinal or hilar lymphadenopathy or evidence of metastatic
disease elsewhere.
3. Remote granulomatous disease.
4. Advanced atherosclerotic calcifications involving the thoracic
and abdominal aorta and branch vessels including three-vessel
coronary artery calcifications.

## 2020-05-14 MED ORDER — FLUDEOXYGLUCOSE F - 18 (FDG) INJECTION
10.5000 | Freq: Once | INTRAVENOUS | Status: AC | PRN
  Administered 2020-05-14: 10.5 via INTRAVENOUS

## 2020-05-16 DIAGNOSIS — Z0000 Encounter for general adult medical examination without abnormal findings: Secondary | ICD-10-CM | POA: Diagnosis not present

## 2020-05-16 DIAGNOSIS — I700 Atherosclerosis of aorta: Secondary | ICD-10-CM | POA: Diagnosis not present

## 2020-05-16 DIAGNOSIS — I2510 Atherosclerotic heart disease of native coronary artery without angina pectoris: Secondary | ICD-10-CM | POA: Diagnosis not present

## 2020-05-16 DIAGNOSIS — C3412 Malignant neoplasm of upper lobe, left bronchus or lung: Secondary | ICD-10-CM | POA: Diagnosis not present

## 2020-05-16 DIAGNOSIS — N401 Enlarged prostate with lower urinary tract symptoms: Secondary | ICD-10-CM | POA: Diagnosis not present

## 2020-05-16 DIAGNOSIS — G4733 Obstructive sleep apnea (adult) (pediatric): Secondary | ICD-10-CM | POA: Diagnosis not present

## 2020-05-16 DIAGNOSIS — E7800 Pure hypercholesterolemia, unspecified: Secondary | ICD-10-CM | POA: Diagnosis not present

## 2020-05-16 DIAGNOSIS — I10 Essential (primary) hypertension: Secondary | ICD-10-CM | POA: Diagnosis not present

## 2020-05-17 ENCOUNTER — Encounter: Payer: Self-pay | Admitting: Thoracic Surgery (Cardiothoracic Vascular Surgery)

## 2020-05-17 ENCOUNTER — Other Ambulatory Visit: Payer: Self-pay

## 2020-05-17 ENCOUNTER — Other Ambulatory Visit: Payer: Self-pay | Admitting: *Deleted

## 2020-05-17 ENCOUNTER — Institutional Professional Consult (permissible substitution) (INDEPENDENT_AMBULATORY_CARE_PROVIDER_SITE_OTHER): Payer: Medicare Other | Admitting: Thoracic Surgery (Cardiothoracic Vascular Surgery)

## 2020-05-17 ENCOUNTER — Encounter: Payer: Self-pay | Admitting: Radiation Oncology

## 2020-05-17 VITALS — BP 136/83 | HR 65 | Resp 18 | Ht 71.0 in | Wt 207.0 lb

## 2020-05-17 DIAGNOSIS — R918 Other nonspecific abnormal finding of lung field: Secondary | ICD-10-CM

## 2020-05-17 DIAGNOSIS — C3492 Malignant neoplasm of unspecified part of left bronchus or lung: Secondary | ICD-10-CM

## 2020-05-17 NOTE — Telephone Encounter (Signed)
Dr. Erin Fulling, please see pt's mychart messages and advise. Pt wants to know if he still needs to have the stress test which is scheduled for 11/1.

## 2020-05-17 NOTE — Progress Notes (Signed)
[address]       [place] [address]             [phone]                    Odis Blinder [facility] Medical Record #[id number] Date of Birth: 1946-01-18  Referring: [name], DO Primary Care: [name], MD Primary Cardiologist: Fransico Him, MD  Chief Complaint:    Chief Complaint  Patient presents with  . Lung Lesion    LUL lung nodule, CT chest 10/8, Bronch 10/21, PET 10/26    History of Present Illness:    Keller Bounds 74 y.o. male referred for surgical evaluation of a left upper lobe nodule that was PET avid.  He also underwent a navigational bronchoscopy with Dr. Valeta Harms and the biopsy did show non-small cell lung cancer.  He denies any respiratory symptoms.  His weight has been stable, and he denies any neurologic symptoms.  He does have a history of coronary artery disease and underwent CABG by Dr. Prescott Gum 14 years ago.  The nodule was found incidentally on cross-sectional imaging when he had some mild respiratory symptoms.       Zubrod Score: At the time of surgery this patient's most appropriate activity status/level should be described as: [x]     0    Normal activity, no symptoms []     1    Restricted in physical strenuous activity but ambulatory, able to do out light work []     2    Ambulatory and capable of self care, unable to do work activities, up and about               >50 % of waking hours                              []     3    Only limited self care, in bed greater than 50% of waking hours []     4    Completely disabled, no self care, confined to bed or chair []     5    Moribund   Past Medical History:  Diagnosis Date  . Arrhythmia    Post-op  . BPH (benign prostatic hypertrophy)   . Bradycardia    a. H/o asymptomatic bradycardia.  . Coronary artery disease    a. s/p CABG ~2007 with LIMA to LAD, SVG seq to OM1 and OM2, SVG to PDA and SVG to diag. b. Abnormal nuc 03/2015 - s/p DES to SVG-OM1-OM2. 01/29/17  POBA with cutting balloon to SVG-->OM  . DCM (dilated cardiomyopathy) ([facility]) 09/17/2015  . Dyslipidemia   . Dyspnea    r/t lung mass  . ED (erectile dysfunction)   . Fear of heights   . Hypertension   . Ischemic cardiomyopathy    a. EF 46% by nuc 03/2015.  Marland Kitchen OSA (obstructive sleep apnea) 07/12/2015   Moderate OSA with AHI 16.5/hr now on CPAP at 8cm H2O, uses cpap nightly     . RBBB   . Shoulder, capsulitis, adhesive    Left Shoulder    Past Surgical History:  Procedure Laterality Date  . BRONCHIAL BIOPSY  05/09/2020   Procedure: BRONCHIAL BIOPSIES;  Surgeon: [name], DO;  Location: [facility];  Service: Pulmonary;;  . BRONCHIAL BRUSHINGS  05/09/2020   Procedure: BRONCHIAL BRUSHINGS;  Surgeon: [name], DO;  Location:  [facility];  Service: Pulmonary;;  . BRONCHIAL NEEDLE ASPIRATION BIOPSY  05/09/2020   Procedure: BRONCHIAL NEEDLE ASPIRATION BIOPSIES;  Surgeon: [name], DO;  Location: [facility];  Service: Pulmonary;;  . BRONCHIAL WASHINGS  05/09/2020   Procedure: BRONCHIAL WASHINGS;  Surgeon: [name], DO;  Location: [facility];  Service: Pulmonary;;  . CARDIAC CATHETERIZATION N/A 04/05/2015   Procedure: Left Heart Cath and Coronary Angiography;  Surgeon: Sherren Mocha, MD;  Location: [facility];  Service: Cardiovascular;  Laterality: N/A;  . COLONOSCOPY    . CORONARY ARTERY BYPASS GRAFT     w/LIMA to LAD, seq SVG to OM1 and OM2, SVG to PDA and SVG to Diagonal  . CORONARY BALLOON ANGIOPLASTY N/A 01/29/2017   Procedure: Coronary Balloon Angioplasty;  Surgeon: Martinique, Peter M, MD;  Location: [facility];  Service: Cardiovascular;  Laterality: N/A;  . FIDUCIAL MARKER PLACEMENT  05/09/2020   Procedure: FIDUCIAL MARKER PLACEMENT;  Surgeon: [name], DO;  Location: [facility];  Service: Pulmonary;;  . LEFT HEART CATH AND CORS/GRAFTS ANGIOGRAPHY N/A 01/29/2017   Procedure: Left Heart Cath and Cors/Grafts Angiography;  Surgeon:  Martinique, Peter M, MD;  Location: [facility];  Service: Cardiovascular;  Laterality: N/A;  . VIDEO BRONCHOSCOPY WITH ENDOBRONCHIAL NAVIGATION N/A 05/09/2020   Procedure: VIDEO BRONCHOSCOPY WITH ENDOBRONCHIAL NAVIGATION;  Surgeon: [name], DO;  Location: [facility];  Service: Pulmonary;  Laterality: N/A;  . VIDEO BRONCHOSCOPY WITH ENDOBRONCHIAL ULTRASOUND N/A 05/09/2020   Procedure: VIDEO BRONCHOSCOPY WITH ENDOBRONCHIAL ULTRASOUND;  Surgeon: [name], DO;  Location: [facility];  Service: Pulmonary;  Laterality: N/A;    Family History  Problem Relation Age of Onset  . Lung cancer Mother   . Lung cancer Father   . Heart disease Father   . Heart failure Father   . Cancer Sister      Social History   Tobacco Use  Smoking Status Former Smoker  . Types: Cigarettes  . Quit date: 07/21/1983  . Years since quitting: 36.8  Smokeless Tobacco Never Used    Social History   Substance and Sexual Activity  Alcohol Use Yes  . Alcohol/week: 20.0 - 28.0 standard drinks  . Types: 20 - 28 Standard drinks or equivalent per week   Comment: daily - 20-28 /week     Allergies  Allergen Reactions  . Amlodipine Swelling    Edema    Current Outpatient Medications  Medication Sig Dispense Refill  . aspirin 81 MG tablet Take 81 mg by mouth at bedtime.     . cholecalciferol (VITAMIN D) 1000 UNITS tablet Take 1,000 Units by mouth at bedtime.     . clopidogrel (PLAVIX) 75 MG tablet TAKE ONE TABLET BY MOUTH EVERY MORNING WITH BREAKFAST (Patient taking differently: Take 75 mg by mouth daily. ) 90 tablet 0  . Cyanocobalamin (B-12 PO) Take 1,000 mcg by mouth daily.     Marland Kitchen doxazosin (CARDURA) 2 MG tablet Take 1 tablet (2 mg total) by mouth daily. (Patient taking differently: Take 2 mg by mouth at bedtime. ) 90 tablet 2  . ezetimibe (ZETIA) 10 MG tablet TAKE ONE TABLET BY MOUTH EVERY NIGHT AT BEDTIME (Patient taking differently: Take 10 mg by mouth daily. ) 90 tablet 0  . folic acid  (FOLVITE) 638 MCG tablet Take 400 mcg by mouth daily.    . Multiple Vitamin (MULTIVITAMIN) tablet Take 1 tablet by mouth daily.    . nitroGLYCERIN (NITROSTAT) 0.4 MG SL tablet  Place 1 tablet (0.4 mg total) under the tongue every 5 (five) minutes as needed for chest pain. 25 tablet 3  . ramipril (ALTACE) 10 MG capsule TAKE TWO CAPSULES BY MOUTH DAILY (Patient taking differently: Take 20 mg by mouth daily. ) 180 capsule 0  . rosuvastatin (CRESTOR) 40 MG tablet TAKE ONE TABLET BY MOUTH EVERY NIGHT AT BEDTIME (Patient taking differently: 40 mg daily. ) 90 tablet 0  . sertraline (ZOLOFT) 50 MG tablet Take 50 mg by mouth in the morning and at bedtime.     Marland Kitchen spironolactone (ALDACTONE) 25 MG tablet TAKE ONE TABLET BY MOUTH DAILY (Patient taking differently: Take 25 mg by mouth daily. ) 90 tablet 0   No current facility-administered medications for this visit.    Review of Systems  Constitutional: Negative for fever, malaise/fatigue and weight loss.  Musculoskeletal: Positive for back pain and myalgias.  Neurological: Negative.      PHYSICAL EXAMINATION: BP 136/83 (BP Location: Left Arm, Patient Position: Sitting)   Pulse 65   Resp 18   Ht 5\' 11"  (1.803 m)   Wt 207 lb (93.9 kg)   SpO2 99% Comment: RA with  mask on  BMI 28.87 kg/m  Physical Exam Constitutional:      General: He is not in acute distress.    Appearance: Normal appearance. He is normal weight. He is not ill-appearing.  Eyes:     Extraocular Movements: Extraocular movements intact.     Conjunctiva/sclera: Conjunctivae normal.  Cardiovascular:     Rate and Rhythm: Normal rate.  Pulmonary:     Effort: No respiratory distress.  Abdominal:     Palpations: Abdomen is soft.  Musculoskeletal:        General: Normal range of motion.     Cervical back: Normal range of motion.  Skin:    General: Skin is warm and dry.  Neurological:     General: No focal deficit present.     Mental Status: He is alert.     Diagnostic  Studies & Laboratory data:     Recent Radiology Findings:   CT Chest W Contrast  Result Date: 04/26/2020 CLINICAL DATA:  Lung nodule EXAM: CT CHEST WITH CONTRAST TECHNIQUE: Multidetector CT imaging of the chest was performed during intravenous contrast administration. CONTRAST:  2mL OMNIPAQUE IOHEXOL 300 MG/ML  SOLN COMPARISON:  None. FINDINGS: Cardiovascular: Coronary artery bypass grafting has been performed. Extensive calcification is seen within the native coronary arteries. Global cardiac size is within normal limits. No pericardial effusion. The central pulmonary arteries are of normal caliber. The thoracic aorta is dilated in its descending segment measuring 3.5 cm at the level of the carina. Moderate atherosclerotic calcification is seen within this segment. Arch vasculature demonstrates normal anatomic configuration and is widely patent proximally. Mediastinum/Nodes: No enlarged mediastinal, hilar, or axillary lymph nodes. Thyroid gland, trachea, and esophagus demonstrate no significant findings. Lungs/Pleura: The opacity seen on recent chest radiograph corresponds to a a 2.4 x 2.5 x 3.1 cm rounded pulmonary mass seen within the left upper lobe in an area of architectural distortion with areas of focal traction bronchiolectasis and cystic change. This may represent tumefactive scarring related to prior infection, however, an underlying neoplasm, such as a scar carcinoma, could appear similarly. Several perifissural nodules are seen within the left lung base. A densely calcified granuloma is seen at the right lung base. A 1.1 x 1.2 x 2.1 cm nodule is seen at the left lung base on coronal image # 82/5. No pneumothorax  or pleural effusion. Central airways are widely patent. Upper Abdomen: Unremarkable Musculoskeletal: No acute bone abnormality. IMPRESSION: Opacity noted on prior chest radiograph corresponds to a a 3.1 cm pulmonary mass within the left upper lobe. An underlying neoplasm is not excluded  and PET CT examination is recommended for further characterization. Indeterminate 2.1 cm nodule within the left lung base. This can be simultaneously assessed with PET CT examination. 3.5 cm descending thoracic aortic aneurysm. Recommend annual imaging followup by CTA or MRA. This recommendation follows 2010 ACCF/AHA/AATS/ACR/ASA/SCA/SCAI/SIR/STS/SVM Guidelines for the Diagnosis and Management of Patients with Thoracic Aortic Disease. Circulation.2010; 121: M086-P619. Aortic aneurysm NOS (ICD10-I71.9) Aortic aneurysm NOS (ICD10-I71.9). Electronically Signed   By: Fidela Salisbury MD   On: 04/26/2020 23:59   NM PET Image Initial (PI) Skull Base To Thigh  Result Date: 05/14/2020 CLINICAL DATA:  Initial treatment strategy for left upper lobe lung mass. EXAM: NUCLEAR MEDICINE PET SKULL BASE TO THIGH TECHNIQUE: 10.5 mCi F-18 FDG was injected intravenously. Full-ring PET imaging was performed from the skull base to thigh after the radiotracer. CT data was obtained and used for attenuation correction and anatomic localization. Fasting blood glucose: 105 mg/dl COMPARISON:  Chest CT 04/26/2020 FINDINGS: Mediastinal blood pool activity: SUV max 2.32 Liver activity: SUV max NA NECK: No hypermetabolic lymph nodes in the neck. Incidental CT findings: Bilateral carotid artery calcifications noted. CHEST: 2.8 cm left upper lobe lung mass is hypermetabolic and consistent with primary lung neoplasm. SUV max is 15.40. No enlarged or hypermetabolic mediastinal or hilar lymph nodes to suggest metastatic disease. Scattered calcified mediastinal and hilar lymph nodes are noted and likely due to remote granulomatous disease. No chest wall mass, supraclavicular or axillary adenopathy. No other worrisome pulmonary lesions are identified. Calcified granulomas are noted at the right lung base. Incidental CT findings: Fiducials are noted around the left upper lobe lung mass. Advanced aortic and coronary artery calcifications and evidence of  prior coronary artery bypass surgery. ABDOMEN/PELVIS: No abnormal hypermetabolic activity within the liver, pancreas, adrenal glands, or spleen. No hypermetabolic lymph nodes in the abdomen or pelvis. Incidental CT findings: Advanced atherosclerotic calcifications involving abdominal aorta iliac arteries. No aneurysm. Scattered calcified granulomas in the liver and spleen. Hazy/misty appearance of the small bowel mesentery likely due to benign mesenteritis or panniculitis. Small periumbilical abdominal wall hernia containing fat. SKELETON: No focal hypermetabolic activity to suggest skeletal metastasis. Incidental CT findings: No significant findings. IMPRESSION: 1. Hypermetabolic 2.8 cm left upper lobe lung mass consistent with primary lung neoplasm. 2. No mediastinal or hilar lymphadenopathy or evidence of metastatic disease elsewhere. 3. Remote granulomatous disease. 4. Advanced atherosclerotic calcifications involving the thoracic and abdominal aorta and branch vessels including three-vessel coronary artery calcifications. Electronically Signed   By: Marijo Sanes M.D.   On: 05/14/2020 10:41   DG CHEST PORT 1 VIEW  Result Date: 05/09/2020 CLINICAL DATA:  Status post bronchoscopy and biopsy EXAM: PORTABLE CHEST 1 VIEW COMPARISON:  April 16, 2020 chest radiograph; chest CT April 26, 2020 FINDINGS: No pneumothorax. Ill-defined airspace opacity in the left upper lobe at the site of known mass is similar in appearance. There are clips in this area currently. Elsewhere the lungs are clear. The heart is mildly enlarged with pulmonary vascularity normal. No adenopathy. There is aortic atherosclerosis. Patient is status post coronary artery bypass grafting. IMPRESSION: No pneumothorax. Airspace opacity in the left upper lobe at the site of mass with surrounding pneumonitis appears essentially stable. Surgical clips now present in this area. No new opacity  evident. Note that a nodular lesions seen in the left  base on recent CT is not appreciable on this portable radiographic examination. Stable cardiomegaly. Status post coronary artery bypass grafting. Aortic Atherosclerosis (ICD10-I70.0). Electronically Signed   By: Lowella Grip III M.D.   On: 05/09/2020 11:27   DG C-ARM BRONCHOSCOPY  Result Date: 05/09/2020 C-ARM BRONCHOSCOPY: Fluoroscopy was utilized by the requesting physician.  No radiographic interpretation.       I have independently reviewed the above radiology studies  and reviewed the findings with the patient.   Recent Lab Findings: Lab Results  Component Value Date   WBC 5.4 05/09/2020   HGB 13.7 05/09/2020   HCT 41.3 05/09/2020   PLT 152 05/09/2020   GLUCOSE 108 (H) 05/09/2020   CHOL 122 02/05/2020   TRIG 75 02/05/2020   HDL 40 02/05/2020   LDLCALC 67 02/05/2020   ALT 24 05/09/2020   AST 26 05/09/2020   NA 140 05/09/2020   K 3.9 05/09/2020   CL 105 05/09/2020   CREATININE 0.96 05/09/2020   BUN 21 05/09/2020   CO2 24 05/09/2020   INR 1.0 05/09/2020     PFTs: - FVC: 95% - FEV1: 94% -DLCO: 106%  Problem List: 2.8 cm left upper lobe biopsy-proven non-small cell cancer with an SUV of 15 History of coronary artery bypass grafting with the use of the left internal thoracic artery.  Assessment / Plan:   74 year old male with a biopsy-proven 2.8 cm left upper lobe non-small cell cancer.  On review of his imaging he does not appear to have any avid lymph nodes in the hilum and mediastinum.  We discussed several options including SBRT and surgical resection.  The patient would like to proceed with the surgical resection via a left robotic assisted thoracoscopy and left upper lobectomy.  I explained to have that given the fact that he is already had a left internal thoracic artery harvesting there may be some scar tissue and adhesions in the left chest.  I reassured him that the priority will be to safely mobilize the lung off of the chest wall without injuring this  vessel.  He is also on Plavix and this will need to be held.  He is agreeable to proceed with surgery and is tentatively scheduled for early November.     I  spent 40 minutes with  the patient face to face and greater then 50% of the time was spent in counseling and coordination of care.    Lajuana Matte 05/17/2020 5:24 PM

## 2020-05-20 ENCOUNTER — Other Ambulatory Visit (HOSPITAL_COMMUNITY)
Admission: RE | Admit: 2020-05-20 | Discharge: 2020-05-20 | Disposition: A | Discharge status: Home / Self Care | Payer: Medicare Other | Source: Ambulatory Visit | Attending: Thoracic Surgery (Cardiothoracic Vascular Surgery) | Admitting: Thoracic Surgery (Cardiothoracic Vascular Surgery)

## 2020-05-20 ENCOUNTER — Telehealth: Payer: Self-pay | Admitting: *Deleted

## 2020-05-20 ENCOUNTER — Encounter (HOSPITAL_COMMUNITY): Payer: Medicare Other

## 2020-05-20 DIAGNOSIS — Z20822 Contact with and (suspected) exposure to covid-19: Secondary | ICD-10-CM | POA: Insufficient documentation

## 2020-05-20 DIAGNOSIS — Z01818 Encounter for other preprocedural examination: Secondary | ICD-10-CM | POA: Insufficient documentation

## 2020-05-20 LAB — SARS CORONAVIRUS 2 (TAT 6-24 HRS): SARS Coronavirus 2: NEGATIVE

## 2020-05-20 NOTE — Progress Notes (Signed)
Your procedure is scheduled on Wednesday, May 22, 2020.  Report to [facility] Main Entrance "A" at 6:30 A.M., and check in at the Admitting office.  Call this number if you have problems the morning of surgery:  [phone]  Call [phone] if you have any questions prior to your surgery date Monday-Friday 8am-4pm    Remember:  Do not eat or drink after midnight the night before your surgery     Take these medicines the morning of surgery with A SIP OF WATER:  sertraline (ZOLOFT)  nitroGLYCERIN (NITROSTAT) - if needed  Follow your surgeon's instructions on when to stop Aspirin and Plavix.  If no instructions were given by your surgeon then you will need to call the office to get those instructions.   As of today, STOP taking any Aleve, Naproxen, Ibuprofen, Motrin, Advil, Goody's, BC's, all herbal medications, fish oil, and all vitamins.                      Do not wear jewelry.            Do not wear lotions, powders, colognes, or deodorant.            Do not shave 48 hours prior to surgery.  Men may shave face and neck.            Do not bring valuables to the hospital.            [facility] is not responsible for any belongings or valuables.  Do NOT Smoke (Tobacco/Vaping) or drink Alcohol 24 hours prior to your procedure If you use a CPAP at night, you may bring all equipment for your overnight stay.   Contacts, glasses, dentures or bridgework may not be worn into surgery.      For patients admitted to the hospital, discharge time will be determined by your treatment team.   Patients discharged the day of surgery will not be allowed to drive home, and someone needs to stay with them for 24 hours.    Special instructions:   [facility]- Preparing For Surgery  Before surgery, you can play an important role. Because skin is not sterile, your skin needs to be as free of germs as possible. You can reduce the number of germs on your skin by washing with CHG  (chlorahexidine gluconate) Soap before surgery.  CHG is an antiseptic cleaner which kills germs and bonds with the skin to continue killing germs even after washing.    Oral Hygiene is also important to reduce your risk of infection.  Remember - BRUSH YOUR TEETH THE MORNING OF SURGERY WITH YOUR REGULAR TOOTHPASTE  Please do not use if you have an allergy to CHG or antibacterial soaps. If your skin becomes reddened/irritated stop using the CHG.  Do not shave (including legs and underarms) for at least 48 hours prior to first CHG shower. It is OK to shave your face.  Please follow these instructions carefully.   1. Shower the NIGHT BEFORE SURGERY and the MORNING OF SURGERY with CHG Soap.   2. If you chose to wash your hair, wash your hair first as usual with your normal shampoo.  3. After you shampoo, rinse your hair and body thoroughly to remove the shampoo.  4. Use CHG as you would any other liquid soap. You can apply CHG directly to the skin and wash gently with a scrungie or a clean washcloth.   5. Apply the CHG Soap to  your body ONLY FROM THE NECK DOWN.  Do not use on open wounds or open sores. Avoid contact with your eyes, ears, mouth and genitals (private parts). Wash Face and genitals (private parts)  with your normal soap.   6. Wash thoroughly, paying special attention to the area where your surgery will be performed.  7. Thoroughly rinse your body with warm water from the neck down.  8. DO NOT shower/wash with your normal soap after using and rinsing off the CHG Soap.  9. Pat yourself dry with a CLEAN TOWEL.  10. Wear CLEAN PAJAMAS to bed the night before surgery  11. Place CLEAN SHEETS on your bed the night of your first shower and DO NOT SLEEP WITH PETS.   Day of Surgery: Wear Clean/Comfortable clothing the morning of surgery Do not apply any deodorants/lotions.   Remember to brush your teeth WITH YOUR REGULAR TOOTHPASTE.   Please read over the following fact sheets  that you were given.

## 2020-05-20 NOTE — Telephone Encounter (Signed)
Patient has decided to have surgery and has requested consult with  Dr. Sondra Come to be canceled.

## 2020-05-21 ENCOUNTER — Other Ambulatory Visit: Payer: Self-pay

## 2020-05-21 ENCOUNTER — Ambulatory Visit (HOSPITAL_COMMUNITY)
Admission: RE | Admit: 2020-05-21 | Discharge: 2020-05-21 | Disposition: A | Discharge status: Home / Self Care | Payer: Medicare Other | Source: Ambulatory Visit | Attending: Thoracic Surgery (Cardiothoracic Vascular Surgery) | Admitting: Thoracic Surgery (Cardiothoracic Vascular Surgery)

## 2020-05-21 ENCOUNTER — Encounter (HOSPITAL_COMMUNITY)
Admission: RE | Admit: 2020-05-21 | Discharge: 2020-05-21 | Disposition: A | Discharge status: Home / Self Care | Payer: Medicare Other | Source: Ambulatory Visit | Attending: Thoracic Surgery (Cardiothoracic Vascular Surgery) | Admitting: Thoracic Surgery (Cardiothoracic Vascular Surgery)

## 2020-05-21 ENCOUNTER — Encounter (HOSPITAL_COMMUNITY): Payer: Self-pay

## 2020-05-21 DIAGNOSIS — C3492 Malignant neoplasm of unspecified part of left bronchus or lung: Secondary | ICD-10-CM

## 2020-05-21 DIAGNOSIS — Z01818 Encounter for other preprocedural examination: Secondary | ICD-10-CM | POA: Diagnosis not present

## 2020-05-21 HISTORY — DX: Malignant neoplasm of unspecified part of unspecified bronchus or lung: C34.90

## 2020-05-21 LAB — TYPE AND SCREEN
ABO/RH(D): O POS
Antibody Screen: NEGATIVE

## 2020-05-21 LAB — URINALYSIS, ROUTINE W REFLEX MICROSCOPIC
Bilirubin Urine: NEGATIVE
Glucose, UA: NEGATIVE mg/dL
Hgb urine dipstick: NEGATIVE
Ketones, ur: NEGATIVE mg/dL
Leukocytes,Ua: NEGATIVE
Nitrite: NEGATIVE
Protein, ur: NEGATIVE mg/dL
Specific Gravity, Urine: 1.012 (ref 1.005–1.030)
pH: 6 (ref 5.0–8.0)

## 2020-05-21 LAB — CBC
HCT: 41.3 % (ref 39.0–52.0)
Hemoglobin: 13.6 g/dL (ref 13.0–17.0)
MCH: 33.6 pg (ref 26.0–34.0)
MCHC: 32.9 g/dL (ref 30.0–36.0)
MCV: 102 fL — ABNORMAL HIGH (ref 80.0–100.0)
Platelets: 148 10*3/uL — ABNORMAL LOW (ref 150–400)
RBC: 4.05 MIL/uL — ABNORMAL LOW (ref 4.22–5.81)
RDW: 12.9 % (ref 11.5–15.5)
WBC: 5.8 10*3/uL (ref 4.0–10.5)
nRBC: 0 % (ref 0.0–0.2)

## 2020-05-21 LAB — BLOOD GAS, ARTERIAL
Acid-Base Excess: 0.1 mmol/L (ref 0.0–2.0)
Bicarbonate: 23.6 mmol/L (ref 20.0–28.0)
Drawn by: 58793
FIO2: 0.21
O2 Saturation: 97.1 %
Patient temperature: 37
pCO2 arterial: 34.3 mmHg (ref 32.0–48.0)
pH, Arterial: 7.452 — ABNORMAL HIGH (ref 7.350–7.450)
pO2, Arterial: 86.5 mmHg (ref 83.0–108.0)

## 2020-05-21 LAB — COMPREHENSIVE METABOLIC PANEL
ALT: 31 U/L (ref 0–44)
AST: 26 U/L (ref 15–41)
Albumin: 4.6 g/dL (ref 3.5–5.0)
Alkaline Phosphatase: 55 U/L (ref 38–126)
Anion gap: 10 (ref 5–15)
BUN: 21 mg/dL (ref 8–23)
CO2: 23 mmol/L (ref 22–32)
Calcium: 9.4 mg/dL (ref 8.9–10.3)
Chloride: 106 mmol/L (ref 98–111)
Creatinine, Ser: 0.86 mg/dL (ref 0.61–1.24)
GFR, Estimated: >60 mL/min (ref >60)
Glucose, Bld: 104 mg/dL — ABNORMAL HIGH (ref 70–99)
Potassium: 4 mmol/L (ref 3.5–5.1)
Sodium: 139 mmol/L (ref 135–145)
Total Bilirubin: 0.5 mg/dL (ref 0.3–1.2)
Total Protein: 7.3 g/dL (ref 6.5–8.1)

## 2020-05-21 LAB — SURGICAL PCR SCREEN
MRSA, PCR: NEGATIVE
Staphylococcus aureus: NEGATIVE

## 2020-05-21 LAB — PROTIME-INR
INR: 1 (ref 0.8–1.2)
Prothrombin Time: 13 seconds (ref 11.4–15.2)

## 2020-05-21 LAB — APTT: aPTT: 29 seconds (ref 24–36)

## 2020-05-21 IMAGING — CR DG CHEST 2V
2 series · 2 of 2 positions shown · non-contrast
Comparison: Subsequent intraoperative portable chest dictated
separately today. Chest radiographs [DATE] and earlier.

CLINICAL DATA: 74-year-old male preoperative study for left lung
surgery.

EXAM:
CHEST - 2 VIEW

[w chest pa]
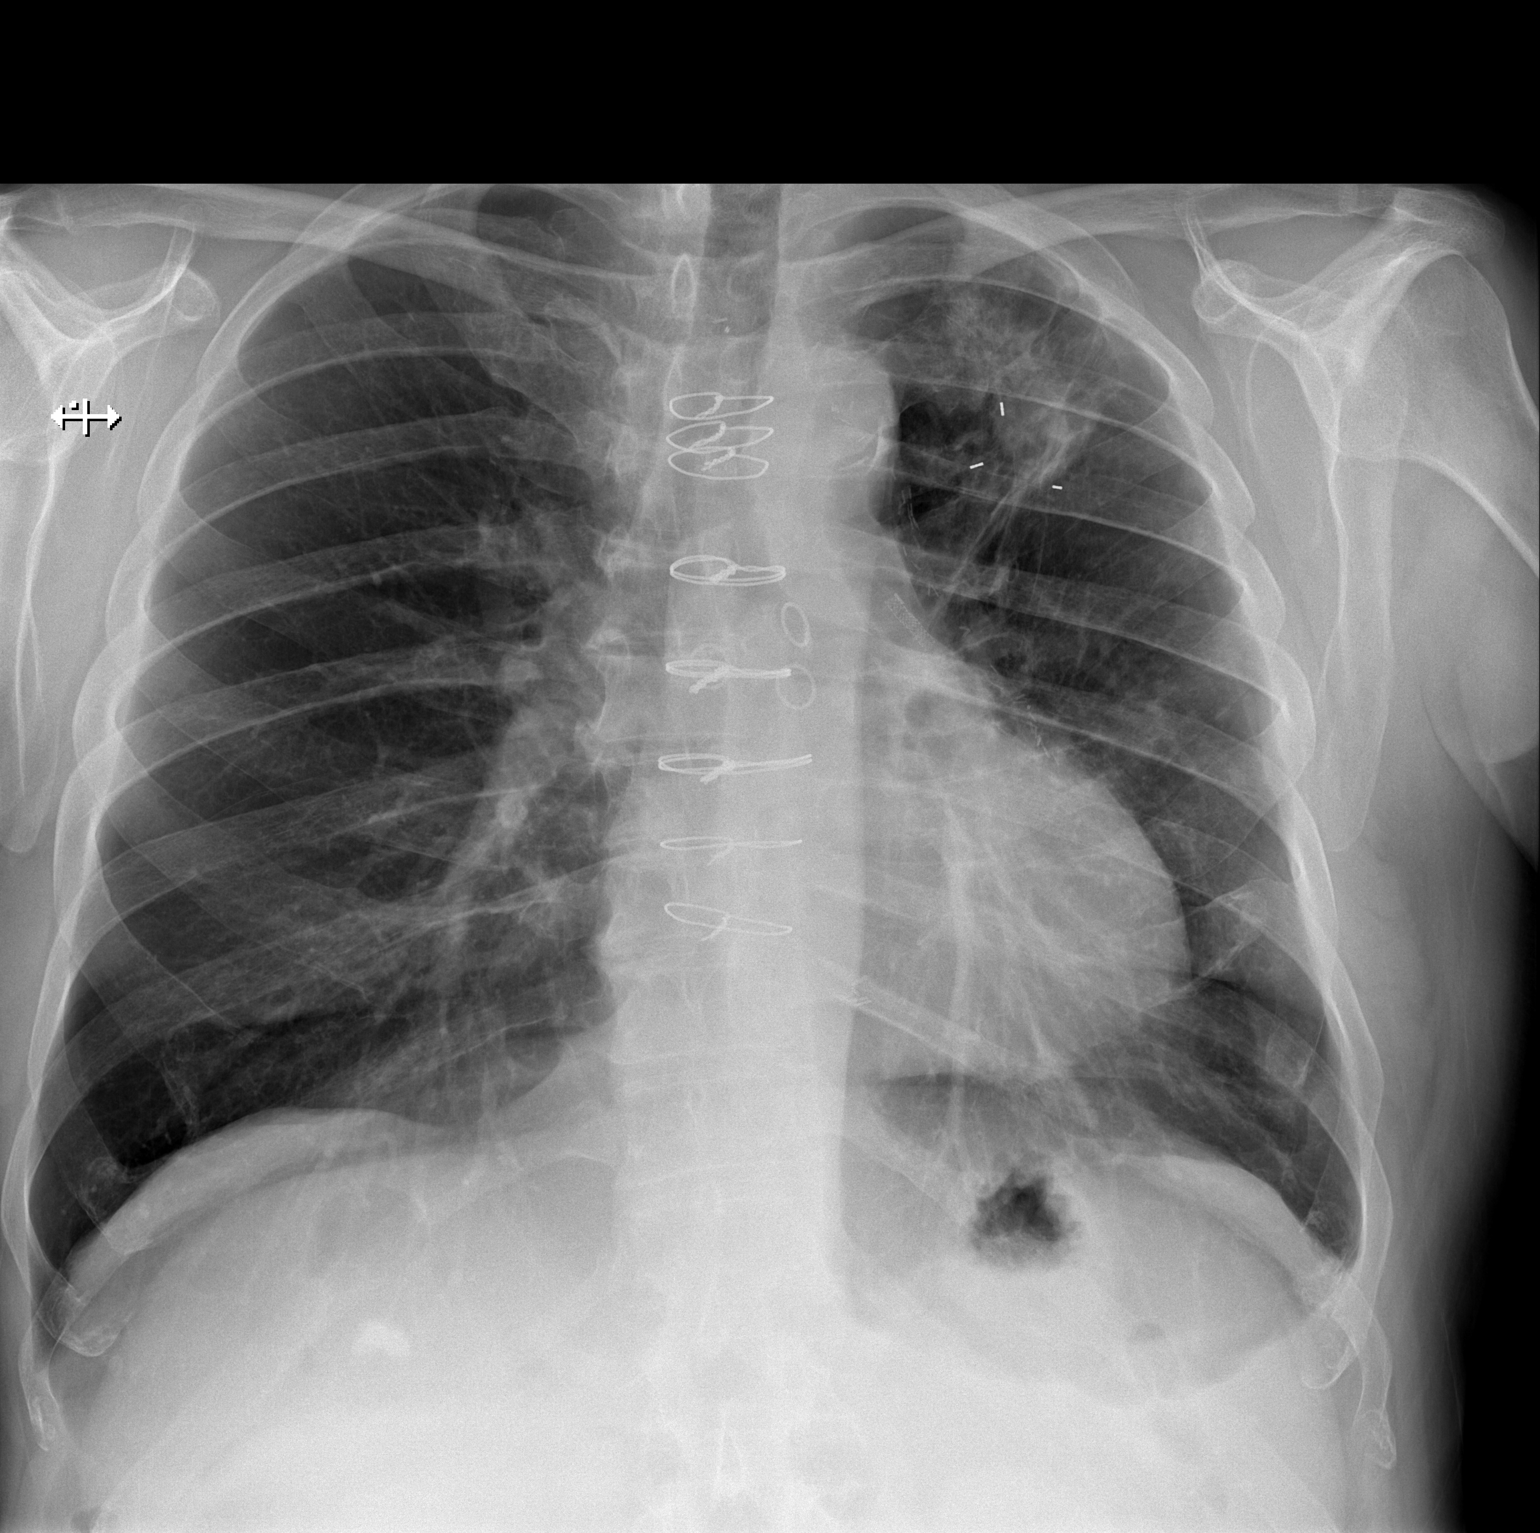

[w chest lat]
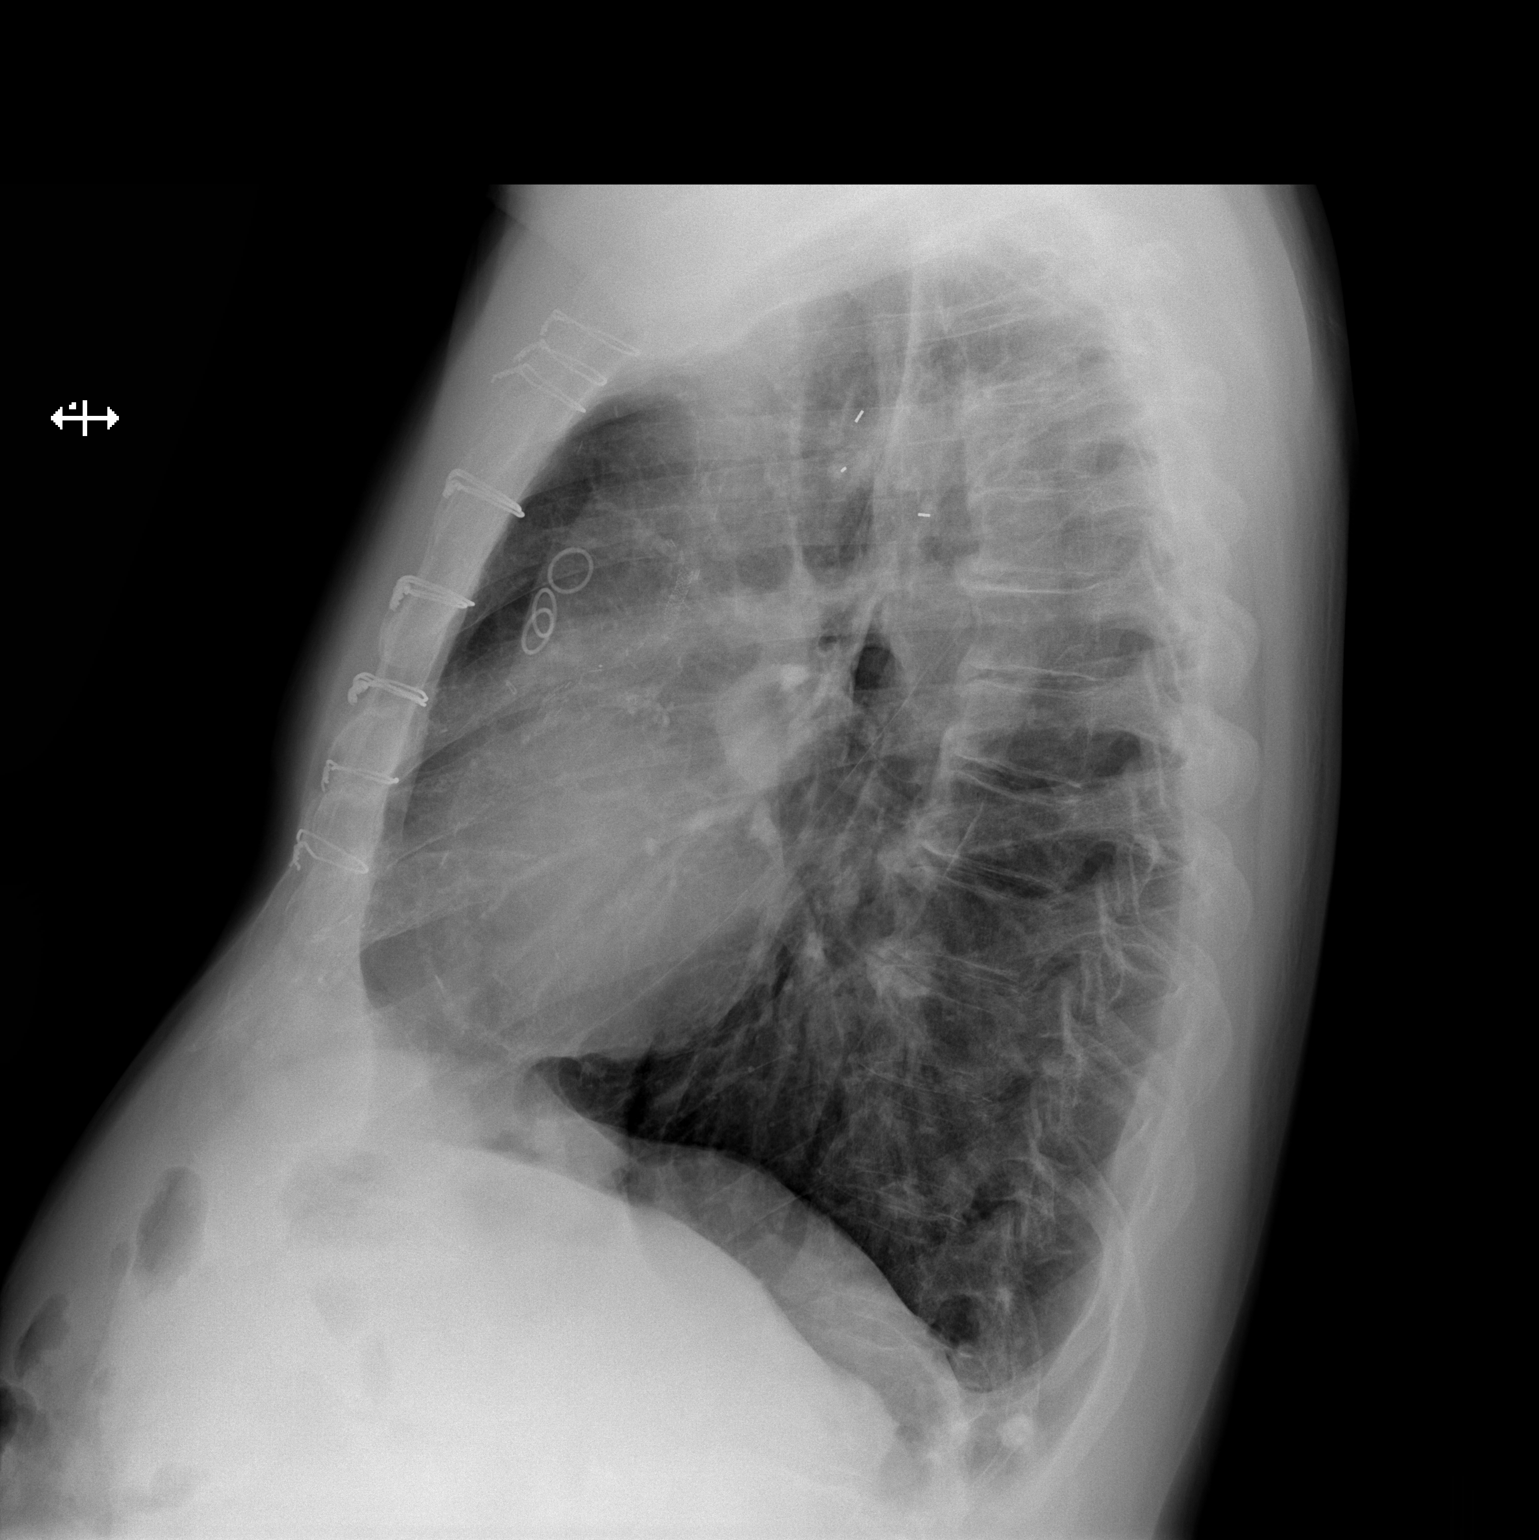

[2 of 2 positions shown; findings below may reference images not displayed]

FINDINGS: Stable irregular masslike opacity in the left upper lung surrounded
by several surgical clips. Mediastinal contours remain within normal
limits. Prior CABG. Larger lung volumes. No pneumothorax, pulmonary
edema, pleural effusion or new pulmonary opacity.

No acute osseous abnormality identified. Negative visible bowel gas
pattern.
IMPRESSION: 1. Known left upper lung mass, with several adjacent small surgical
clips.
2. No new cardiopulmonary abnormality.  Prior CABG.

## 2020-05-21 NOTE — Anesthesia Preprocedure Evaluation (Addendum)
Anesthesia Evaluation  Patient identified by MRN, date of birth, ID band Patient awake    Reviewed: Allergy & Precautions, NPO status , Patient's Chart, lab work & pertinent test results  History of Anesthesia Complications Negative for: history of anesthetic complications  Airway Mallampati: II  TM Distance: >3 FB Neck ROM: Full    Dental  (+) Dental Advisory Given   Pulmonary shortness of breath, sleep apnea and Continuous Positive Airway Pressure Ventilation , former smoker,  R lung mass 05/20/2020 SARS coronavirus neg   breath sounds clear to auscultation       Cardiovascular hypertension, Pt. on medications (-) angina+ CAD, + Cardiac Stents, + CABG and +CHF (dilated cardiomyopathy)   Rhythm:Regular Rate:Normal  02/2020 Stress: EF 46%, fixed defect basal inferolat, basal anterolat myocardium (Cx),  no reversible ischemia  02/2020 ECHO: EF 55-60%, Grade 1 DD, mild MR, mild TR   Neuro/Psych negative neurological ROS     GI/Hepatic negative GI ROS, (+)     substance abuse  alcohol use,   Endo/Other  negative endocrine ROS  Renal/GU negative Renal ROS     Musculoskeletal   Abdominal   Peds  Hematology plavix   Anesthesia Other Findings   Reproductive/Obstetrics                            Anesthesia Physical Anesthesia Plan  ASA: III  Anesthesia Plan: General   Post-op Pain Management:    Induction: Intravenous  PONV Risk Score and Plan: 2 and Ondansetron and Dexamethasone  Airway Management Planned: Double Lumen EBT  Additional Equipment: Arterial line  Intra-op Plan:   Post-operative Plan: Extubation in OR  Informed Consent: I have reviewed the patients History and Physical, chart, labs and discussed the procedure including the risks, benefits and alternatives for the proposed anesthesia with the patient or authorized representative who has indicated his/her understanding  and acceptance.     Dental advisory given  Plan Discussed with: CRNA and Surgeon  Anesthesia Plan Comments:        Anesthesia Quick Evaluation

## 2020-05-21 NOTE — Progress Notes (Signed)
PCP - Dr. [name] Cardiologist - Dr. Fransico Him  PPM/ICD - denies  Chest x-ray - 05/21/2020 EKG - 05/21/2020 Stress Test - 02/28/2020 ECHO - 02/28/2020 Cardiac Cath - 2016  Sleep Study - YES, per patient, wears CPAP every night  DM: denies  Blood Thinner Instructions: per patient last dose 05/16/2020 Aspirin Instructions: per patient, to continue taking as normal   ERAS Protcol - No  COVID TEST- Tested on 05/20/2020, negative. Patient verbalized understanding of self-quarantine instructions.  Anesthesia review: YES, cardiac history  Patient denies shortness of breath, fever, cough and chest pain at PAT appointment  All instructions explained to the patient, with a verbal understanding of the material. Patient agrees to go over the instructions while at home for a better understanding. Patient also instructed to self quarantine after being tested for COVID-19. The opportunity to ask questions was provided.

## 2020-05-22 ENCOUNTER — Inpatient Hospital Stay (HOSPITAL_COMMUNITY): Payer: Medicare Other | Admitting: Physician Assistant

## 2020-05-22 ENCOUNTER — Inpatient Hospital Stay (HOSPITAL_COMMUNITY): Payer: Medicare Other

## 2020-05-22 ENCOUNTER — Ambulatory Visit: Payer: Medicare Other | Admitting: Radiation Oncology

## 2020-05-22 ENCOUNTER — Inpatient Hospital Stay (HOSPITAL_COMMUNITY)
Admission: RE | Admit: 2020-05-22 | Discharge: 2020-05-24 | DRG: 164 | Disposition: A | Discharge status: Home / Self Care | Payer: Medicare Other | Attending: Thoracic Surgery (Cardiothoracic Vascular Surgery) | Admitting: Thoracic Surgery (Cardiothoracic Vascular Surgery)

## 2020-05-22 ENCOUNTER — Encounter (HOSPITAL_COMMUNITY)
Admission: RE | Disposition: A | Discharge status: Home / Self Care | Payer: Self-pay | Source: Home / Self Care | Attending: Thoracic Surgery (Cardiothoracic Vascular Surgery)

## 2020-05-22 ENCOUNTER — Ambulatory Visit: Payer: Medicare Other

## 2020-05-22 ENCOUNTER — Encounter (HOSPITAL_COMMUNITY): Payer: Self-pay | Admitting: Thoracic Surgery (Cardiothoracic Vascular Surgery)

## 2020-05-22 DIAGNOSIS — Y658 Other specified misadventures during surgical and medical care: Secondary | ICD-10-CM

## 2020-05-22 DIAGNOSIS — Z79899 Other long term (current) drug therapy: Secondary | ICD-10-CM

## 2020-05-22 DIAGNOSIS — Z955 Presence of coronary angioplasty implant and graft: Secondary | ICD-10-CM | POA: Diagnosis not present

## 2020-05-22 DIAGNOSIS — J939 Pneumothorax, unspecified: Secondary | ICD-10-CM

## 2020-05-22 DIAGNOSIS — G4733 Obstructive sleep apnea (adult) (pediatric): Secondary | ICD-10-CM | POA: Diagnosis present

## 2020-05-22 DIAGNOSIS — Z951 Presence of aortocoronary bypass graft: Secondary | ICD-10-CM

## 2020-05-22 DIAGNOSIS — I10 Essential (primary) hypertension: Secondary | ICD-10-CM | POA: Diagnosis present

## 2020-05-22 DIAGNOSIS — I420 Dilated cardiomyopathy: Secondary | ICD-10-CM | POA: Diagnosis present

## 2020-05-22 DIAGNOSIS — Z87891 Personal history of nicotine dependence: Secondary | ICD-10-CM

## 2020-05-22 DIAGNOSIS — N400 Enlarged prostate without lower urinary tract symptoms: Secondary | ICD-10-CM | POA: Diagnosis not present

## 2020-05-22 DIAGNOSIS — C771 Secondary and unspecified malignant neoplasm of intrathoracic lymph nodes: Secondary | ICD-10-CM | POA: Diagnosis not present

## 2020-05-22 DIAGNOSIS — Z20822 Contact with and (suspected) exposure to covid-19: Secondary | ICD-10-CM | POA: Diagnosis not present

## 2020-05-22 DIAGNOSIS — I4510 Unspecified right bundle-branch block: Secondary | ICD-10-CM | POA: Diagnosis present

## 2020-05-22 DIAGNOSIS — Z7982 Long term (current) use of aspirin: Secondary | ICD-10-CM

## 2020-05-22 DIAGNOSIS — D62 Acute posthemorrhagic anemia: Secondary | ICD-10-CM | POA: Diagnosis not present

## 2020-05-22 DIAGNOSIS — C3492 Malignant neoplasm of unspecified part of left bronchus or lung: Secondary | ICD-10-CM

## 2020-05-22 DIAGNOSIS — C3412 Malignant neoplasm of upper lobe, left bronchus or lung: Secondary | ICD-10-CM | POA: Diagnosis not present

## 2020-05-22 DIAGNOSIS — Z902 Acquired absence of lung [part of]: Secondary | ICD-10-CM

## 2020-05-22 DIAGNOSIS — Z7902 Long term (current) use of antithrombotics/antiplatelets: Secondary | ICD-10-CM | POA: Diagnosis not present

## 2020-05-22 DIAGNOSIS — Z8249 Family history of ischemic heart disease and other diseases of the circulatory system: Secondary | ICD-10-CM

## 2020-05-22 DIAGNOSIS — I517 Cardiomegaly: Secondary | ICD-10-CM | POA: Diagnosis not present

## 2020-05-22 DIAGNOSIS — I2510 Atherosclerotic heart disease of native coronary artery without angina pectoris: Secondary | ICD-10-CM | POA: Diagnosis not present

## 2020-05-22 DIAGNOSIS — J95812 Postprocedural air leak: Secondary | ICD-10-CM | POA: Diagnosis not present

## 2020-05-22 DIAGNOSIS — E785 Hyperlipidemia, unspecified: Secondary | ICD-10-CM | POA: Diagnosis present

## 2020-05-22 DIAGNOSIS — C3490 Malignant neoplasm of unspecified part of unspecified bronchus or lung: Secondary | ICD-10-CM | POA: Diagnosis not present

## 2020-05-22 DIAGNOSIS — Z801 Family history of malignant neoplasm of trachea, bronchus and lung: Secondary | ICD-10-CM

## 2020-05-22 DIAGNOSIS — J9811 Atelectasis: Secondary | ICD-10-CM | POA: Diagnosis not present

## 2020-05-22 DIAGNOSIS — J90 Pleural effusion, not elsewhere classified: Secondary | ICD-10-CM | POA: Diagnosis not present

## 2020-05-22 DIAGNOSIS — J439 Emphysema, unspecified: Secondary | ICD-10-CM | POA: Diagnosis not present

## 2020-05-22 DIAGNOSIS — Z4682 Encounter for fitting and adjustment of non-vascular catheter: Secondary | ICD-10-CM | POA: Diagnosis not present

## 2020-05-22 HISTORY — PX: NODE DISSECTION: SHX5269

## 2020-05-22 HISTORY — PX: INTERCOSTAL NERVE BLOCK: SHX5021

## 2020-05-22 LAB — POCT I-STAT 7, (LYTES, BLD GAS, ICA,H+H)
Acid-base deficit: 5 mmol/L — ABNORMAL HIGH (ref 0.0–2.0)
Bicarbonate: 22.9 mmol/L (ref 20.0–28.0)
Calcium, Ion: 1.12 mmol/L — ABNORMAL LOW (ref 1.15–1.40)
HCT: 33 % — ABNORMAL LOW (ref 39.0–52.0)
Hemoglobin: 11.2 g/dL — ABNORMAL LOW (ref 13.0–17.0)
O2 Saturation: 98 %
Patient temperature: 35.6
Potassium: 4.1 mmol/L (ref 3.5–5.1)
Sodium: 141 mmol/L (ref 135–145)
TCO2: 24 mmol/L (ref 22–32)
pCO2 arterial: 49.7 mmHg — ABNORMAL HIGH (ref 32.0–48.0)
pH, Arterial: 7.264 — ABNORMAL LOW (ref 7.350–7.450)
pO2, Arterial: 121 mmHg — ABNORMAL HIGH (ref 83.0–108.0)

## 2020-05-22 IMAGING — CR DG CHEST 1V PORT
1 series · 1 of 1 positions shown · non-contrast
Comparison: Chest radiographs [DATE] and earlier.

CLINICAL DATA: 74-year-old male undergoing robotic assisted chest
surgery. Query retained foreign body from broken equipment.

EXAM:
PORTABLE CHEST 1 VIEW

[AP]
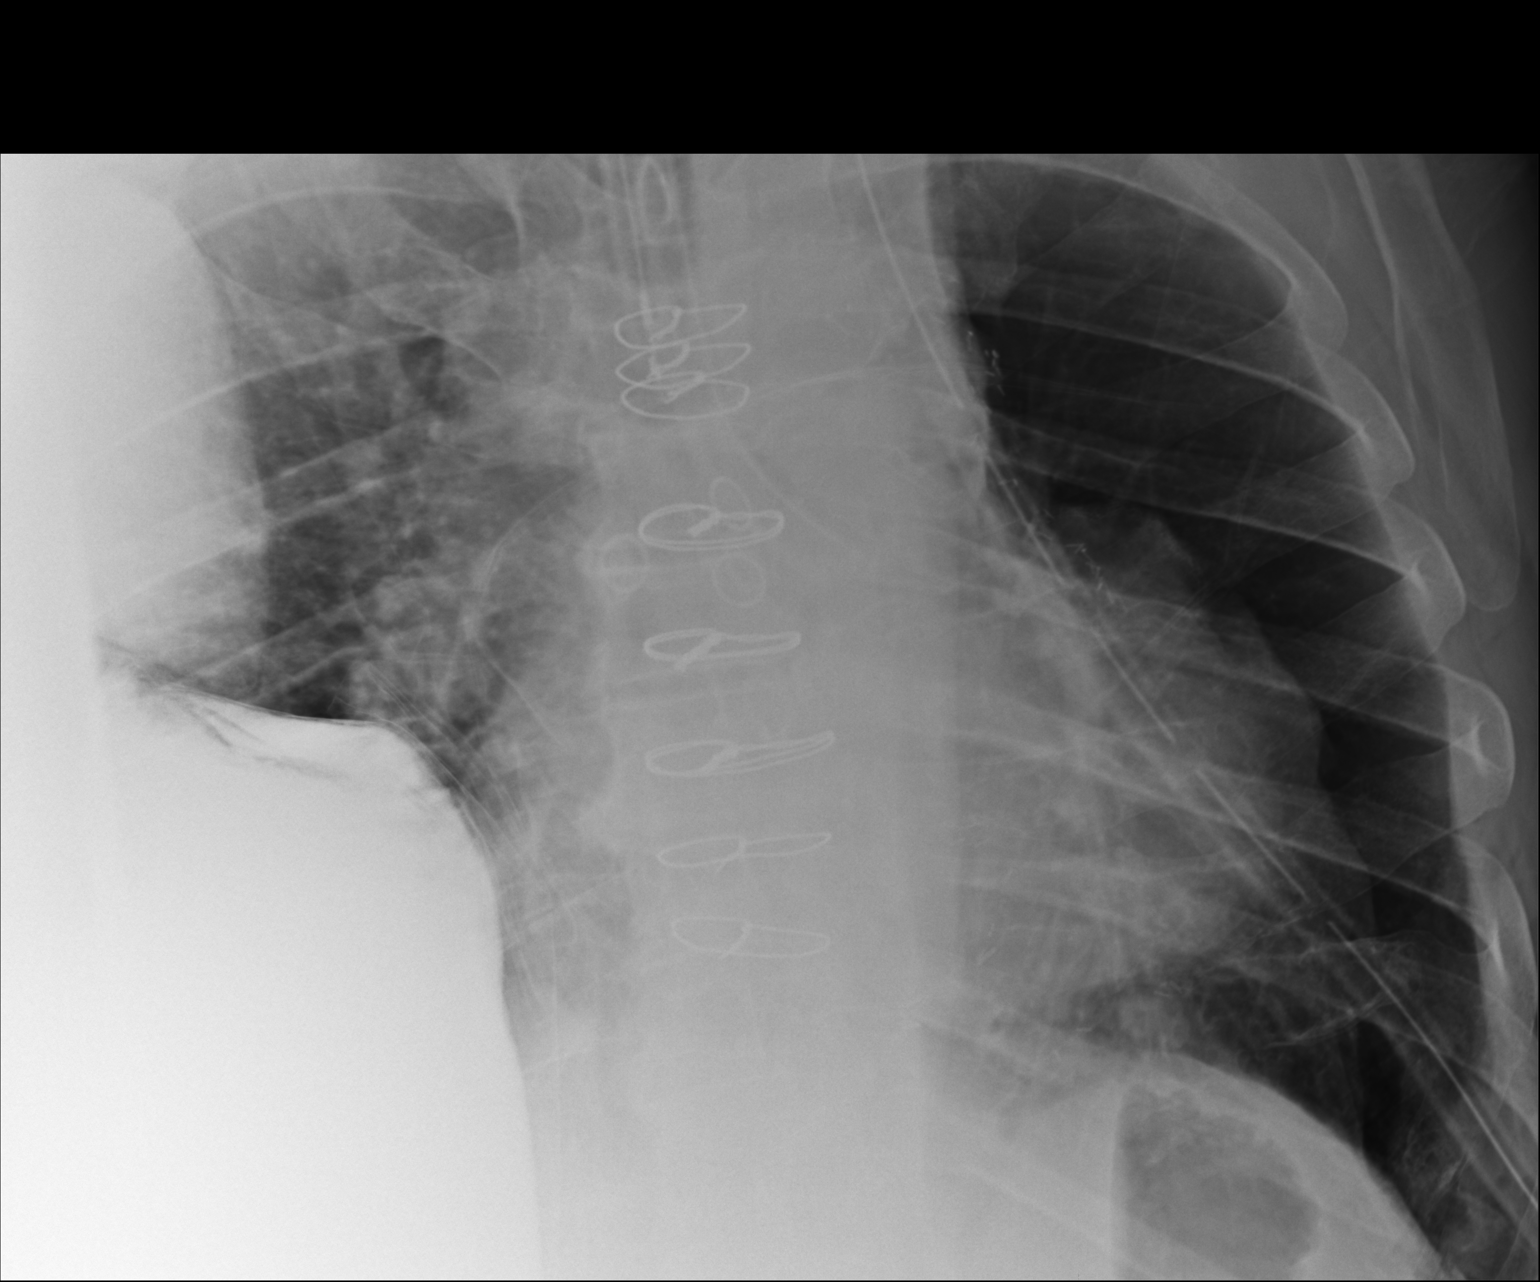

[1 of 1 positions shown; findings below may reference images not displayed]

FINDINGS: Portable cross-table lateral view of the chest at [EA] hours. A left
chest tube is in place. Mediastinal contours appear grossly stable.
No pneumothorax is evident. Small surgical clips are noted along the
left hilum. No retained metallic fragment or other foreign body
identified. Pre-existing CABG.

Left mainstem bronchus intubation.
IMPRESSION: Left chest tube in place and surgical clips along the left hilum. No
pneumothorax or retained metallic foreign body identified.

Study discussed by telephone with OR personnel LOCKLEAR on [DATE] at
[DATE], who advises the patient has now been extubated.

## 2020-05-22 SURGERY — LOBECTOMY, LUNG, ROBOT-ASSISTED, USING VATS
Anesthesia: General | Site: Chest

## 2020-05-22 MED ORDER — ROCURONIUM BROMIDE 10 MG/ML (PF) SYRINGE
PREFILLED_SYRINGE | INTRAVENOUS | Status: AC
  Filled 2020-05-22: qty 10

## 2020-05-22 MED ORDER — KETOROLAC TROMETHAMINE 30 MG/ML IJ SOLN
15.0000 mg | Freq: Four times a day (QID) | INTRAMUSCULAR | Status: DC
  Administered 2020-05-22 – 2020-05-23 (×3): 15 mg via INTRAVENOUS
  Filled 2020-05-22 (×3): qty 1

## 2020-05-22 MED ORDER — SPIRONOLACTONE 25 MG PO TABS
25.0000 mg | ORAL_TABLET | Freq: Every day | ORAL | Status: DC
  Administered 2020-05-22 – 2020-05-24 (×3): 25 mg via ORAL
  Filled 2020-05-22 (×3): qty 1

## 2020-05-22 MED ORDER — SODIUM CHLORIDE 0.9 % IV SOLN: INTRAVENOUS | Status: DC | PRN

## 2020-05-22 MED ORDER — LUNG SURGERY BOOK
Freq: Once | Status: AC
  Filled 2020-05-22: qty 1

## 2020-05-22 MED ORDER — EPHEDRINE SULFATE 50 MG/ML IJ SOLN
INTRAMUSCULAR | Status: DC | PRN
  Administered 2020-05-22: 10 mg via INTRAVENOUS
  Administered 2020-05-22: 5 mg via INTRAVENOUS

## 2020-05-22 MED ORDER — ONDANSETRON HCL 4 MG/2ML IJ SOLN
INTRAMUSCULAR | Status: DC | PRN
  Administered 2020-05-22: 4 mg via INTRAVENOUS

## 2020-05-22 MED ORDER — ACETAMINOPHEN 500 MG PO TABS
1000.0000 mg | ORAL_TABLET | Freq: Four times a day (QID) | ORAL | Status: DC
  Administered 2020-05-22 – 2020-05-24 (×7): 1000 mg via ORAL
  Filled 2020-05-22 (×7): qty 2

## 2020-05-22 MED ORDER — TRAMADOL HCL 50 MG PO TABS
50.0000 mg | ORAL_TABLET | Freq: Four times a day (QID) | ORAL | Status: DC | PRN
  Administered 2020-05-23: 50 mg via ORAL
  Administered 2020-05-23: 100 mg via ORAL
  Filled 2020-05-22: qty 1
  Filled 2020-05-22: qty 2

## 2020-05-22 MED ORDER — BUPIVACAINE LIPOSOME 1.3 % IJ SUSP
20.0000 mL | INTRAMUSCULAR | Status: DC
  Filled 2020-05-22: qty 20

## 2020-05-22 MED ORDER — SODIUM CHLORIDE FLUSH 0.9 % IV SOLN
INTRAVENOUS | Status: DC | PRN
  Administered 2020-05-22: 100 mL

## 2020-05-22 MED ORDER — HEMOSTATIC AGENTS (NO CHARGE) OPTIME
TOPICAL | Status: DC | PRN
  Administered 2020-05-22: 1 via TOPICAL

## 2020-05-22 MED ORDER — FENTANYL CITRATE (PF) 250 MCG/5ML IJ SOLN
INTRAMUSCULAR | Status: AC
  Filled 2020-05-22: qty 5

## 2020-05-22 MED ORDER — OXYCODONE HCL 5 MG PO TABS: 5.0000 mg | ORAL_TABLET | Freq: Once | ORAL | Status: DC | PRN

## 2020-05-22 MED ORDER — PROPOFOL 10 MG/ML IV BOLUS
INTRAVENOUS | Status: DC | PRN
  Administered 2020-05-22: 150 mg via INTRAVENOUS
  Administered 2020-05-22: 50 mg via INTRAVENOUS

## 2020-05-22 MED ORDER — CHLORHEXIDINE GLUCONATE 0.12 % MT SOLN: 15.0000 mL | Freq: Once | OROMUCOSAL | Status: AC

## 2020-05-22 MED ORDER — ACETAMINOPHEN 500 MG PO TABS
1000.0000 mg | ORAL_TABLET | Freq: Once | ORAL | Status: AC
  Administered 2020-05-22: 1000 mg via ORAL
  Filled 2020-05-22: qty 2

## 2020-05-22 MED ORDER — MIDAZOLAM HCL 5 MG/5ML IJ SOLN
INTRAMUSCULAR | Status: DC | PRN
  Administered 2020-05-22: 2 mg via INTRAVENOUS

## 2020-05-22 MED ORDER — PROMETHAZINE HCL 25 MG/ML IJ SOLN: 6.2500 mg | INTRAMUSCULAR | Status: DC | PRN

## 2020-05-22 MED ORDER — RAMIPRIL 10 MG PO CAPS
10.0000 mg | ORAL_CAPSULE | Freq: Every day | ORAL | Status: DC
  Administered 2020-05-22 – 2020-05-24 (×3): 10 mg via ORAL
  Filled 2020-05-22 (×3): qty 1

## 2020-05-22 MED ORDER — LIDOCAINE 2% (20 MG/ML) 5 ML SYRINGE
INTRAMUSCULAR | Status: DC | PRN
  Administered 2020-05-22: 40 mg via INTRAVENOUS

## 2020-05-22 MED ORDER — DOXAZOSIN MESYLATE 4 MG PO TABS
2.0000 mg | ORAL_TABLET | Freq: Every day | ORAL | Status: DC
  Administered 2020-05-22 – 2020-05-24 (×3): 2 mg via ORAL
  Filled 2020-05-22 (×3): qty 1

## 2020-05-22 MED ORDER — PHENYLEPHRINE HCL (PRESSORS) 10 MG/ML IV SOLN
INTRAVENOUS | Status: DC | PRN
  Administered 2020-05-22 (×2): 100 ug via INTRAVENOUS

## 2020-05-22 MED ORDER — KETOROLAC TROMETHAMINE 0.5 % OP SOLN
1.0000 [drp] | Freq: Four times a day (QID) | OPHTHALMIC | Status: DC
  Administered 2020-05-22: 1 [drp] via OPHTHALMIC
  Filled 2020-05-22: qty 5

## 2020-05-22 MED ORDER — CLOPIDOGREL BISULFATE 75 MG PO TABS
75.0000 mg | ORAL_TABLET | Freq: Every day | ORAL | Status: DC
  Administered 2020-05-23 – 2020-05-24 (×2): 75 mg via ORAL
  Filled 2020-05-22 (×2): qty 1

## 2020-05-22 MED ORDER — HYDROMORPHONE HCL 1 MG/ML IJ SOLN
INTRAMUSCULAR | Status: AC
  Administered 2020-05-22: 0.3 mg via INTRAVENOUS
  Filled 2020-05-22: qty 1

## 2020-05-22 MED ORDER — ONDANSETRON HCL 4 MG/2ML IJ SOLN
INTRAMUSCULAR | Status: AC
  Filled 2020-05-22: qty 2

## 2020-05-22 MED ORDER — SENNOSIDES-DOCUSATE SODIUM 8.6-50 MG PO TABS
1.0000 | ORAL_TABLET | Freq: Every day | ORAL | Status: DC
  Administered 2020-05-22: 1 via ORAL
  Filled 2020-05-22 (×2): qty 1

## 2020-05-22 MED ORDER — BUPIVACAINE HCL (PF) 0.5 % IJ SOLN
INTRAMUSCULAR | Status: AC
  Filled 2020-05-22: qty 30

## 2020-05-22 MED ORDER — ONDANSETRON HCL 4 MG/2ML IJ SOLN: 4.0000 mg | Freq: Four times a day (QID) | INTRAMUSCULAR | Status: DC | PRN

## 2020-05-22 MED ORDER — DEXAMETHASONE SODIUM PHOSPHATE 4 MG/ML IJ SOLN
INTRAMUSCULAR | Status: DC | PRN
  Administered 2020-05-22: 4 mg via INTRAVENOUS

## 2020-05-22 MED ORDER — CEFAZOLIN SODIUM-DEXTROSE 2-4 GM/100ML-% IV SOLN
2.0000 g | INTRAVENOUS | Status: AC
  Administered 2020-05-22: 2 g via INTRAVENOUS
  Filled 2020-05-22: qty 100

## 2020-05-22 MED ORDER — BISACODYL 5 MG PO TBEC
10.0000 mg | DELAYED_RELEASE_TABLET | Freq: Every day | ORAL | Status: DC
  Administered 2020-05-23: 10 mg via ORAL
  Filled 2020-05-22 (×2): qty 2

## 2020-05-22 MED ORDER — HYDROMORPHONE HCL 1 MG/ML IJ SOLN
0.2500 mg | INTRAMUSCULAR | Status: DC | PRN
  Administered 2020-05-22: 0.3 mg via INTRAVENOUS
  Administered 2020-05-22: 0.4 mg via INTRAVENOUS

## 2020-05-22 MED ORDER — MEPERIDINE HCL 25 MG/ML IJ SOLN: 6.2500 mg | INTRAMUSCULAR | Status: DC | PRN

## 2020-05-22 MED ORDER — LACTATED RINGERS IV SOLN: INTRAVENOUS | Status: DC

## 2020-05-22 MED ORDER — PHENYLEPHRINE HCL-NACL 10-0.9 MG/250ML-% IV SOLN: INTRAVENOUS | Status: DC | PRN

## 2020-05-22 MED ORDER — ACETAMINOPHEN 160 MG/5ML PO SOLN
1000.0000 mg | Freq: Four times a day (QID) | ORAL | Status: DC
  Administered 2020-05-24: 1000 mg via ORAL
  Filled 2020-05-22: qty 40.6

## 2020-05-22 MED ORDER — CHLORHEXIDINE GLUCONATE 0.12 % MT SOLN
OROMUCOSAL | Status: AC
  Administered 2020-05-22: 15 mL via OROMUCOSAL
  Filled 2020-05-22: qty 15

## 2020-05-22 MED ORDER — PROPOFOL 10 MG/ML IV BOLUS
INTRAVENOUS | Status: AC
  Filled 2020-05-22: qty 20

## 2020-05-22 MED ORDER — FENTANYL CITRATE (PF) 100 MCG/2ML IJ SOLN
INTRAMUSCULAR | Status: DC | PRN
Start: 2020-05-22 — End: 2020-05-22
  Administered 2020-05-22 (×2): 50 ug via INTRAVENOUS
  Administered 2020-05-22: 250 ug via INTRAVENOUS

## 2020-05-22 MED ORDER — ORAL CARE MOUTH RINSE: 15.0000 mL | Freq: Once | OROMUCOSAL | Status: AC

## 2020-05-22 MED ORDER — SERTRALINE HCL 25 MG PO TABS
50.0000 mg | ORAL_TABLET | Freq: Every day | ORAL | Status: DC
  Administered 2020-05-23 – 2020-05-24 (×2): 50 mg via ORAL
  Filled 2020-05-22 (×2): qty 2

## 2020-05-22 MED ORDER — ROSUVASTATIN CALCIUM 20 MG PO TABS
40.0000 mg | ORAL_TABLET | Freq: Every day | ORAL | Status: DC
  Administered 2020-05-23: 40 mg via ORAL
  Filled 2020-05-22: qty 2

## 2020-05-22 MED ORDER — ALBUMIN HUMAN 5 % IV SOLN: INTRAVENOUS | Status: DC | PRN

## 2020-05-22 MED ORDER — DEXAMETHASONE SODIUM PHOSPHATE 10 MG/ML IJ SOLN
INTRAMUSCULAR | Status: AC
  Filled 2020-05-22: qty 1

## 2020-05-22 MED ORDER — KETOROLAC TROMETHAMINE 30 MG/ML IJ SOLN
30.0000 mg | Freq: Four times a day (QID) | INTRAMUSCULAR | Status: DC
Start: 2020-05-22 — End: 2020-05-22

## 2020-05-22 MED ORDER — MIDAZOLAM HCL 2 MG/2ML IJ SOLN
INTRAMUSCULAR | Status: AC
  Filled 2020-05-22: qty 2

## 2020-05-22 MED ORDER — ROCURONIUM BROMIDE 100 MG/10ML IV SOLN
INTRAVENOUS | Status: DC | PRN
  Administered 2020-05-22: 20 mg via INTRAVENOUS
  Administered 2020-05-22: 80 mg via INTRAVENOUS
  Administered 2020-05-22: 30 mg via INTRAVENOUS
  Administered 2020-05-22: 20 mg via INTRAVENOUS
  Administered 2020-05-22: 10 mg via INTRAVENOUS
  Administered 2020-05-22: 30 mg via INTRAVENOUS
  Administered 2020-05-22: 20 mg via INTRAVENOUS

## 2020-05-22 MED ORDER — 0.9 % SODIUM CHLORIDE (POUR BTL) OPTIME
TOPICAL | Status: DC | PRN
  Administered 2020-05-22 (×2): 1000 mL

## 2020-05-22 MED ORDER — LIDOCAINE 2% (20 MG/ML) 5 ML SYRINGE
INTRAMUSCULAR | Status: AC
  Filled 2020-05-22: qty 5

## 2020-05-22 MED ORDER — MIDAZOLAM HCL 2 MG/2ML IJ SOLN: 0.5000 mg | Freq: Once | INTRAMUSCULAR | Status: DC | PRN

## 2020-05-22 MED ORDER — PHENYLEPHRINE HCL-NACL 10-0.9 MG/250ML-% IV SOLN
INTRAVENOUS | Status: DC | PRN
  Administered 2020-05-22: 50 ug/min via INTRAVENOUS

## 2020-05-22 MED ORDER — SUGAMMADEX SODIUM 200 MG/2ML IV SOLN
INTRAVENOUS | Status: DC | PRN
  Administered 2020-05-22: 200 mg via INTRAVENOUS

## 2020-05-22 MED ORDER — OXYCODONE HCL 5 MG/5ML PO SOLN: 5.0000 mg | Freq: Once | ORAL | Status: DC | PRN

## 2020-05-22 SURGICAL SUPPLY — 136 items
ADH SKN CLS APL DERMABOND .7 (GAUZE/BANDAGES/DRESSINGS) ×2
APL PRP STRL LF DISP 70% ISPRP (MISCELLANEOUS) ×2
BAG SPEC RTRVL C1550 15 (MISCELLANEOUS) ×2
BAG SPEC RTRVL LRG 6X4 10 (ENDOMECHANICALS)
BLADE CLIPPER SURG (BLADE) ×4 IMPLANT
BLADE SURG 11 STRL SS (BLADE) ×4 IMPLANT
BNDG COHESIVE 6X5 TAN STRL LF (GAUZE/BANDAGES/DRESSINGS) ×2 IMPLANT
CANISTER SUCT 3000ML PPV (MISCELLANEOUS) ×8 IMPLANT
CANNULA REDUC XI 12-8 STAPL (CANNULA) ×6
CANNULA REDUC XI 12-8MM STAPL (CANNULA) ×2
CANNULA REDUCER 12-8 DVNC XI (CANNULA) ×4 IMPLANT
CATH THORACIC 28FR (CATHETERS) ×2 IMPLANT
CATH THORACIC 28FR RT ANG (CATHETERS) IMPLANT
CATH THORACIC 36FR (CATHETERS) IMPLANT
CATH THORACIC 36FR RT ANG (CATHETERS) IMPLANT
CATH TROCAR 20FR (CATHETERS) IMPLANT
CHLORAPREP W/TINT 26 (MISCELLANEOUS) ×4 IMPLANT
CLIP VESOCCLUDE MED 6/CT (CLIP) IMPLANT
CNTNR URN SCR LID CUP LEK RST (MISCELLANEOUS) ×10 IMPLANT
CONN ST 1/4X3/8  BEN (MISCELLANEOUS)
CONN ST 1/4X3/8 BEN (MISCELLANEOUS) IMPLANT
CONN Y 3/8X3/8X3/8  BEN (MISCELLANEOUS)
CONN Y 3/8X3/8X3/8 BEN (MISCELLANEOUS) IMPLANT
CONT SPEC 4OZ STRL OR WHT (MISCELLANEOUS) ×44
COVER SURGICAL LIGHT HANDLE (MISCELLANEOUS) IMPLANT
DEFOGGER SCOPE WARMER CLEARIFY (MISCELLANEOUS) ×4 IMPLANT
DERMABOND ADVANCED (GAUZE/BANDAGES/DRESSINGS) ×2
DERMABOND ADVANCED .7 DNX12 (GAUZE/BANDAGES/DRESSINGS) ×2 IMPLANT
DISSECTOR BLUNT TIP ENDO 5MM (MISCELLANEOUS) IMPLANT
DRAIN CHANNEL 28F RND 3/8 FF (WOUND CARE) IMPLANT
DRAIN CHANNEL 32F RND 10.7 FF (WOUND CARE) IMPLANT
DRAPE ARM DVNC X/XI (DISPOSABLE) ×8 IMPLANT
DRAPE COLUMN DVNC XI (DISPOSABLE) ×2 IMPLANT
DRAPE CV SPLIT W-CLR ANES SCRN (DRAPES) ×4 IMPLANT
DRAPE DA VINCI XI ARM (DISPOSABLE) ×16
DRAPE DA VINCI XI COLUMN (DISPOSABLE) ×4
DRAPE ORTHO SPLIT 77X108 STRL (DRAPES) ×4
DRAPE SURG ORHT 6 SPLT 77X108 (DRAPES) ×2 IMPLANT
DRAPE WARM FLUID 44X44 (DRAPES) ×4 IMPLANT
ELECT BLADE 6.5 EXT (BLADE) IMPLANT
ELECT REM PT RETURN 9FT ADLT (ELECTROSURGICAL) ×4
ELECTRODE REM PT RTRN 9FT ADLT (ELECTROSURGICAL) ×2 IMPLANT
GAUZE KITTNER 4X10 (MISCELLANEOUS) ×2 IMPLANT
GAUZE KITTNER 4X5 RF (MISCELLANEOUS) ×6 IMPLANT
GAUZE KITTNER 4X8 (MISCELLANEOUS) ×2 IMPLANT
GAUZE SPONGE 4X4 12PLY STRL (GAUZE/BANDAGES/DRESSINGS) ×4 IMPLANT
GLOVE BIO SURGEON STRL SZ 6.5 (GLOVE) ×4 IMPLANT
GLOVE BIO SURGEON STRL SZ7.5 (GLOVE) ×8 IMPLANT
GLOVE BIO SURGEONS STRL SZ 6.5 (GLOVE) ×4
GLOVE BIOGEL PI IND STRL 6.5 (GLOVE) IMPLANT
GLOVE BIOGEL PI INDICATOR 6.5 (GLOVE) ×2
GLOVE SURG SS PI 7.5 STRL IVOR (GLOVE) ×4 IMPLANT
GLOVE SURG SS PI 8.0 STRL IVOR (GLOVE) ×2 IMPLANT
GOWN STRL REUS W/ TWL LRG LVL3 (GOWN DISPOSABLE) ×4 IMPLANT
GOWN STRL REUS W/ TWL XL LVL3 (GOWN DISPOSABLE) ×6 IMPLANT
GOWN STRL REUS W/TWL 2XL LVL3 (GOWN DISPOSABLE) ×4 IMPLANT
GOWN STRL REUS W/TWL LRG LVL3 (GOWN DISPOSABLE) ×28
GOWN STRL REUS W/TWL XL LVL3 (GOWN DISPOSABLE) ×12
HEMOSTAT SURGICEL 2X14 (HEMOSTASIS) ×8 IMPLANT
IRRIGATION STRYKERFLOW (MISCELLANEOUS) IMPLANT
IRRIGATOR STRYKERFLOW (MISCELLANEOUS)
IRRIGATOR SUCT 8 DISP DVNC XI (IRRIGATION / IRRIGATOR) IMPLANT
IRRIGATOR SUCTION 8MM XI DISP (IRRIGATION / IRRIGATOR) ×4
KIT BASIN OR (CUSTOM PROCEDURE TRAY) ×4 IMPLANT
KIT SUCTION CATH 14FR (SUCTIONS) IMPLANT
KIT TURNOVER KIT B (KITS) ×4 IMPLANT
LOOP VESSEL SUPERMAXI WHITE (MISCELLANEOUS) IMPLANT
NEEDLE 22X1 1/2 (OR ONLY) (NEEDLE) ×4 IMPLANT
NS IRRIG 1000ML POUR BTL (IV SOLUTION) ×10 IMPLANT
PACK CHEST (CUSTOM PROCEDURE TRAY) ×4 IMPLANT
PAD ARMBOARD 7.5X6 YLW CONV (MISCELLANEOUS) ×20 IMPLANT
PORT ACCESS TROCAR AIRSEAL 12 (TROCAR) ×2 IMPLANT
PORT ACCESS TROCAR AIRSEAL 5M (TROCAR) ×2
POUCH ENDO CATCH II 15MM (MISCELLANEOUS) IMPLANT
POUCH SPECIMEN RETRIEVAL 10MM (ENDOMECHANICALS) IMPLANT
RELOAD STAPLE 45 2.5 WHT DVNC (STAPLE) IMPLANT
RELOAD STAPLE 45 3.5 BLU DVNC (STAPLE) IMPLANT
RELOAD STAPLE 45 4.3 GRN DVNC (STAPLE) IMPLANT
RELOAD STAPLER 2.5X45 WHT DVNC (STAPLE) ×10 IMPLANT
RELOAD STAPLER 3.5X45 BLU DVNC (STAPLE) ×12 IMPLANT
RELOAD STAPLER 4.3X45 GRN DVNC (STAPLE) ×2 IMPLANT
RETRACTOR WOUND ALXS 19CM XSML (INSTRUMENTS) IMPLANT
RTRCTR WOUND ALEXIS 19CM XSML (INSTRUMENTS)
SCISSORS LAP 5X35 DISP (ENDOMECHANICALS) IMPLANT
SEAL CANN UNIV 5-8 DVNC XI (MISCELLANEOUS) ×4 IMPLANT
SEAL XI 5MM-8MM UNIVERSAL (MISCELLANEOUS) ×8
SEALANT PROGEL (MISCELLANEOUS) IMPLANT
SEALANT SURG COSEAL 4ML (VASCULAR PRODUCTS) IMPLANT
SEALANT SURG COSEAL 8ML (VASCULAR PRODUCTS) IMPLANT
SEALER LIGASURE MARYLAND 30 (ELECTROSURGICAL) IMPLANT
SET TRI-LUMEN FLTR TB AIRSEAL (TUBING) ×4 IMPLANT
SOLUTION ELECTROLUBE (MISCELLANEOUS) ×2 IMPLANT
SPONGE INTESTINAL PEANUT (DISPOSABLE) IMPLANT
SPONGE TONSIL TAPE 1 RFD (DISPOSABLE) IMPLANT
STAPLER 45 SUREFORM CVD (STAPLE) ×4
STAPLER 45 SUREFORM CVD DVNC (STAPLE) IMPLANT
STAPLER CANNULA SEAL DVNC XI (STAPLE) ×4 IMPLANT
STAPLER CANNULA SEAL XI (STAPLE) ×8
STAPLER RELOAD 2.5X45 WHITE (STAPLE) ×20
STAPLER RELOAD 2.5X45 WHT DVNC (STAPLE) ×10
STAPLER RELOAD 3.5X45 BLU DVNC (STAPLE) ×12
STAPLER RELOAD 3.5X45 BLUE (STAPLE) ×24
STAPLER RELOAD 4.3X45 GREEN (STAPLE) ×4
STAPLER RELOAD 4.3X45 GRN DVNC (STAPLE) ×2
STOPCOCK 4 WAY LG BORE MALE ST (IV SETS) ×4 IMPLANT
SUT MNCRL AB 3-0 PS2 18 (SUTURE) IMPLANT
SUT MON AB 2-0 CT1 36 (SUTURE) IMPLANT
SUT PDS AB 1 CTX 36 (SUTURE) IMPLANT
SUT PROLENE 4 0 RB 1 (SUTURE)
SUT PROLENE 4-0 RB1 .5 CRCL 36 (SUTURE) IMPLANT
SUT SILK  1 MH (SUTURE) ×4
SUT SILK 1 MH (SUTURE) ×2 IMPLANT
SUT SILK 1 TIES 10X30 (SUTURE) IMPLANT
SUT SILK 2 0 SH (SUTURE) ×2 IMPLANT
SUT SILK 2 0SH CR/8 30 (SUTURE) IMPLANT
SUT VIC AB 1 CTX 36 (SUTURE)
SUT VIC AB 1 CTX36XBRD ANBCTR (SUTURE) IMPLANT
SUT VIC AB 2-0 CT1 27 (SUTURE) ×4
SUT VIC AB 2-0 CT1 TAPERPNT 27 (SUTURE) ×2 IMPLANT
SUT VIC AB 3-0 SH 27 (SUTURE) ×8
SUT VIC AB 3-0 SH 27X BRD (SUTURE) ×4 IMPLANT
SUT VICRYL 0 TIES 12 18 (SUTURE) ×4 IMPLANT
SUT VICRYL 0 UR6 27IN ABS (SUTURE) ×8 IMPLANT
SUT VICRYL 2 TP 1 (SUTURE) IMPLANT
SYR 10ML LL (SYRINGE) ×4 IMPLANT
SYR 20ML LL LF (SYRINGE) ×4 IMPLANT
SYR 50ML LL SCALE MARK (SYRINGE) ×4 IMPLANT
SYR BULB IRRIG 60ML STRL (SYRINGE) ×2 IMPLANT
SYSTEM RETRIEVAL ANCHOR 15 (MISCELLANEOUS) ×2 IMPLANT
SYSTEM SAHARA CHEST DRAIN ATS (WOUND CARE) ×4 IMPLANT
TAPE CLOTH 4X10 WHT NS (GAUZE/BANDAGES/DRESSINGS) ×4 IMPLANT
TAPE CLOTH SURG 4X10 WHT LF (GAUZE/BANDAGES/DRESSINGS) ×2 IMPLANT
TIP APPLICATOR SPRAY EXTEND 16 (VASCULAR PRODUCTS) IMPLANT
TOWEL GREEN STERILE (TOWEL DISPOSABLE) ×4 IMPLANT
TRAY FOLEY MTR SLVR 16FR STAT (SET/KITS/TRAYS/PACK) ×4 IMPLANT
TUBING EXTENTION W/L.L. (IV SETS) ×4 IMPLANT

## 2020-05-22 NOTE — Anesthesia Procedure Notes (Addendum)
Arterial Line Insertion Start/End11/09/2019 7:30 AM, 05/22/2020 7:45 AM Performed by: Lavell Luster, CRNA, CRNA  Patient location: Pre-op. Preanesthetic checklist: patient identified, IV checked, risks and benefits discussed, surgical consent, monitors and equipment checked, pre-op evaluation and timeout performed Lidocaine 1% used for infiltration Right, radial was placed Catheter size: 20 G Hand hygiene performed , maximum sterile barriers used  and Seldinger technique used Allen's test indicative of satisfactory collateral circulation Attempts: 1 Procedure performed without using ultrasound guided technique. Following insertion, dressing applied and Biopatch. Post procedure assessment: normal  Patient tolerated the procedure well with no immediate complications.

## 2020-05-22 NOTE — Plan of Care (Signed)
Initiation of Care Plan Problem: Education: Goal: Knowledge of General Education information will improve Description: Including pain rating scale, medication(s)/side effects and non-pharmacologic comfort measures Outcome: Progressing   Problem: Health Behavior/Discharge Planning: Goal: Ability to manage health-related needs will improve Outcome: Progressing   Problem: Clinical Measurements: Goal: Ability to maintain clinical measurements within normal limits will improve Outcome: Progressing Goal: Will remain free from infection Outcome: Progressing Goal: Diagnostic test results will improve Outcome: Progressing Goal: Respiratory complications will improve Outcome: Progressing Goal: Cardiovascular complication will be avoided Outcome: Progressing   Problem: Activity: Goal: Risk for activity intolerance will decrease Outcome: Progressing   Problem: Nutrition: Goal: Adequate nutrition will be maintained Outcome: Progressing   Problem: Coping: Goal: Level of anxiety will decrease Outcome: Progressing   Problem: Elimination: Goal: Will not experience complications related to bowel motility Outcome: Progressing Goal: Will not experience complications related to urinary retention Outcome: Progressing   Problem: Pain Managment: Goal: General experience of comfort will improve Outcome: Progressing   Problem: Safety: Goal: Ability to remain free from injury will improve Outcome: Progressing   Problem: Skin Integrity: Goal: Risk for impaired skin integrity will decrease Outcome: Progressing   Problem: Education: Goal: Knowledge of disease or condition will improve Outcome: Progressing Goal: Knowledge of the prescribed therapeutic regimen will improve Outcome: Progressing   Problem: Activity: Goal: Risk for activity intolerance will decrease Outcome: Progressing   Problem: Cardiac: Goal: Will achieve and/or maintain hemodynamic stability Outcome: Progressing    Problem: Clinical Measurements: Goal: Postoperative complications will be avoided or minimized Outcome: Progressing   Problem: Respiratory: Goal: Respiratory status will improve Outcome: Progressing   Problem: Pain Management: Goal: Pain level will decrease Outcome: Progressing   Problem: Skin Integrity: Goal: Wound healing without signs and symptoms infection will improve Outcome: Progressing

## 2020-05-22 NOTE — Op Note (Signed)
[address]       [place] [address]             [phone]        05/22/2020  Patient:  John Hinton Pre-Op Dx: Left upper lobe NSCLC   Post-op Dx:  same Procedure:  - Robotic assisted Left video thoracoscopy - Left upper lobectomy - Mediastinal lymph node sampling - Intercostal nerve block  Surgeon and Role:      * Cherly Erno, Lucile Crater, MD - Primary    * M. Roddenberry, PA-C - assisting  Anesthesia  general EBL:  200 ml Blood Administration: none Specimen:  Left upper lobe.  Hilar lymph nodes.  Level 9 nodes  Drains: 31 F argyle chest tube in left chest Counts: correct   Indications:  74 year old male with a biopsy-proven 2.8 cm left upper lobe non-small cell cancer. On review of his imaging he does not appear to have any avid lymph nodes in the hilum and mediastinum. We discussed several options including SBRT and surgical resection. The patient would like to proceed with the surgical resection via a left robotic assisted thoracoscopy and left upper lobectomy. I explained to have that given the fact that he is already had a left internal thoracic artery harvesting there may be some scar tissue and adhesions in the left chest. I reassured him that the priority will be to safely mobilize the lung off of the chest wall without injuring this vessel. He is also on Plavix and this will need to be held.   Findings: The left upper lobe was densely adherent to the anterior chest wall, and mammary vessel from his previous CABG.  We worked in a posterior to anterior fashion first isolating the pulmonary artery branches.  The bronchus was then identified, and divided.  The lung was then mobilized off of the chest wall.  A wedge resection of the lung was required to separate it from the entry of the LIMA into the pericardium, and at the site of the phrenic nerve slightly more posteriorly.  Thus 2 small portions of the left upper lobe were left in the  cavity as they were densely adherent to the LIMA and phrenic nerve.  Both were far from the tumor.  I was then able to identify then identify the upper lobe vein.    Operative Technique: After the risks, benefits and alternatives were thoroughly discussed, the patient was brought to the operative theatre.  Anesthesia was induced, and the patient was then placed in a right lateral decubitus position and was prepped and draped in normal sterile fashion.  An appropriate surgical pause was performed, and pre-operative antibiotics were dosed accordingly.  We began by placing our 4 robotic ports in the the 7th intercostal space targeting the hilum of the lung.  A 50mm assistant port was placed in the 9th intercostal space in the anterior axillary line.  The robot was then docked and all instruments were passed under direct visualization.    The lung was then retracted superiorly, and the inferior pulmonary ligament was divided.  The hilum was mobilized posteriorly.  We identified the the lingular branch of the pulmonary artery within the fissure vein, and after careful isolation, it was divided with a vascular stapler.  We continued with our posterior exposure identified the segmental branches to the upper lobe and sequentially divided them.  Next we moved to the bronchus to the upper lobe.  After a test clamp and identified  good aeration to the lower lobe we divided it with the robotic stapler.  We then to the anterior chest wall where the lung was densely adherent.  Great care was taken to mobilize it off of left internal thoracic artery.  At its insertion through the pericardium a portion of the lung was wedged off.  We continued to mobilize the anterior hilum, and finally identified the pulmonary vein to the upper lobe.  After isolating it was divided with the robotic stapler as well.  The specimen was passed into an endocatch bag.  It was removed from the superior access site.    Lymph nodes were then sampled  at levels 9 in the hilum..  The chest was irrigated, and an air leak test was performed.  An intercostal nerve block was performed under direct visualization.  A 28 F chest with then placed, and we watch the remaining lobes re-expand.  The skin and soft tissue were closed with absorbable suture    The patient tolerated the procedure without any immediate complications, and was transferred to the PACU in stable condition.  John Hinton

## 2020-05-22 NOTE — Plan of Care (Signed)

## 2020-05-22 NOTE — H&P (Signed)
[address]       [place] [address]             [phone]       No events since his clinic appointment Vitals:   05/22/20 0648  BP: (!) 167/91  Pulse: 60  Resp: 18  Temp: (!) 97.3 F (36.3 C)  SpO2: 96%   Alert NAD Sinus EWOB  74 yo male with LUL NSCLC OR today for L RATS, left upper lobectomy.  [facility] Record #[id number] Date of Birth: [birth year]  Referring: [name], DO Primary Care: [name], MD Primary Cardiologist: Fransico Him, MD  Chief Complaint:        Chief Complaint  Patient presents with  . Lung Lesion    LUL lung nodule, CT chest 10/8, Bronch 10/21, PET 10/26    History of Present Illness:    John Hinton 74 y.o. male referred for surgical evaluation of a left upper lobe nodule that was PET avid.  He also underwent a navigational bronchoscopy with Dr. Valeta Harms and the biopsy did show non-small cell lung cancer.  He denies any respiratory symptoms.  His weight has been stable, and he denies any neurologic symptoms.  He does have a history of coronary artery disease and underwent CABG by Dr. Prescott Gum 14 years ago.  The nodule was found incidentally on cross-sectional imaging when he had some mild respiratory symptoms.       Zubrod Score: At the time of surgery this patient's most appropriate activity status/level should be described as: [x] ?    0    Normal activity, no symptoms [] ?    1    Restricted in physical strenuous activity but ambulatory, able to do out light work [] ?    2    Ambulatory and capable of self care, unable to do work activities, up and about               >50 % of waking hours                              [] ?    3    Only limited self care, in bed greater than 50% of waking hours [] ?    4    Completely disabled, no self care, confined to bed or chair [] ?    5    Moribund       Past Medical History:  Diagnosis Date  . Arrhythmia    Post-op  .  BPH (benign prostatic hypertrophy)   . Bradycardia    a. H/o asymptomatic bradycardia.  . Coronary artery disease    a. s/p CABG ~2007 with LIMA to LAD, SVG seq to OM1 and OM2, SVG to PDA and SVG to diag. b. Abnormal nuc 03/2015 - s/p DES to SVG-OM1-OM2. 01/29/17 POBA with cutting balloon to SVG-->OM  . DCM (dilated cardiomyopathy) ([facility]) 09/17/2015  . Dyslipidemia   . Dyspnea    r/t lung mass  . ED (erectile dysfunction)   . Fear of heights   . Hypertension   . Ischemic cardiomyopathy    a. EF 46% by nuc 03/2015.  Marland Kitchen OSA (obstructive sleep apnea) 07/12/2015   Moderate OSA with AHI 16.5/hr now on CPAP at 8cm H2O, uses cpap nightly     . RBBB   . Shoulder, capsulitis, adhesive    Left Shoulder  Past Surgical History:  Procedure Laterality Date  . BRONCHIAL BIOPSY  05/09/2020   Procedure: BRONCHIAL BIOPSIES;  Surgeon: [name], DO;  Location: [facility];  Service: Pulmonary;;  . BRONCHIAL BRUSHINGS  05/09/2020   Procedure: BRONCHIAL BRUSHINGS;  Surgeon: [name], DO;  Location: [facility];  Service: Pulmonary;;  . BRONCHIAL NEEDLE ASPIRATION BIOPSY  05/09/2020   Procedure: BRONCHIAL NEEDLE ASPIRATION BIOPSIES;  Surgeon: [name], DO;  Location: [facility];  Service: Pulmonary;;  . BRONCHIAL WASHINGS  05/09/2020   Procedure: BRONCHIAL WASHINGS;  Surgeon: [name], DO;  Location: [facility];  Service: Pulmonary;;  . CARDIAC CATHETERIZATION N/A 04/05/2015   Procedure: Left Heart Cath and Coronary Angiography;  Surgeon: Sherren Mocha, MD;  Location: [facility];  Service: Cardiovascular;  Laterality: N/A;  . COLONOSCOPY    . CORONARY ARTERY BYPASS GRAFT     w/LIMA to LAD, seq SVG to OM1 and OM2, SVG to PDA and SVG to Diagonal  . CORONARY BALLOON ANGIOPLASTY N/A 01/29/2017   Procedure: Coronary Balloon Angioplasty;  Surgeon: Martinique, Peter M, MD;  Location: [facility];  Service: Cardiovascular;   Laterality: N/A;  . FIDUCIAL MARKER PLACEMENT  05/09/2020   Procedure: FIDUCIAL MARKER PLACEMENT;  Surgeon: [name], DO;  Location: [facility];  Service: Pulmonary;;  . LEFT HEART CATH AND CORS/GRAFTS ANGIOGRAPHY N/A 01/29/2017   Procedure: Left Heart Cath and Cors/Grafts Angiography;  Surgeon: Martinique, Peter M, MD;  Location: [facility];  Service: Cardiovascular;  Laterality: N/A;  . VIDEO BRONCHOSCOPY WITH ENDOBRONCHIAL NAVIGATION N/A 05/09/2020   Procedure: VIDEO BRONCHOSCOPY WITH ENDOBRONCHIAL NAVIGATION;  Surgeon: [name], DO;  Location: [facility];  Service: Pulmonary;  Laterality: N/A;  . VIDEO BRONCHOSCOPY WITH ENDOBRONCHIAL ULTRASOUND N/A 05/09/2020   Procedure: VIDEO BRONCHOSCOPY WITH ENDOBRONCHIAL ULTRASOUND;  Surgeon: [name], DO;  Location: [facility];  Service: Pulmonary;  Laterality: N/A;         Family History  Problem Relation Age of Onset  . Lung cancer Mother   . Lung cancer Father   . Heart disease Father   . Heart failure Father   . Cancer Sister      Social History       Tobacco Use  Smoking Status Former Smoker  . Types: Cigarettes  . Quit date: 07/21/1983  . Years since quitting: 36.8  Smokeless Tobacco Never Used    Social History       Substance and Sexual Activity  Alcohol Use Yes  . Alcohol/week: 20.0 - 28.0 standard drinks  . Types: 20 - 28 Standard drinks or equivalent per week   Comment: daily - 20-28 /week          Allergies  Allergen Reactions  . Amlodipine Swelling    Edema    Current Outpatient Medications  Medication Sig Dispense Refill  . aspirin 81 MG tablet Take 81 mg by mouth at bedtime.     . cholecalciferol (VITAMIN D) 1000 UNITS tablet Take 1,000 Units by mouth at bedtime.     . clopidogrel (PLAVIX) 75 MG tablet TAKE ONE TABLET BY MOUTH EVERY MORNING WITH BREAKFAST (Patient taking differently: Take 75 mg by mouth daily. ) 90 tablet 0  . Cyanocobalamin  (B-12 PO) Take 1,000 mcg by mouth daily.     Marland Kitchen doxazosin (CARDURA) 2 MG tablet Take 1 tablet (2 mg total) by mouth daily. (Patient taking differently: Take 2 mg by mouth at bedtime. ) 90 tablet  2  . ezetimibe (ZETIA) 10 MG tablet TAKE ONE TABLET BY MOUTH EVERY NIGHT AT BEDTIME (Patient taking differently: Take 10 mg by mouth daily. ) 90 tablet 0  . folic acid (FOLVITE) 875 MCG tablet Take 400 mcg by mouth daily.    . Multiple Vitamin (MULTIVITAMIN) tablet Take 1 tablet by mouth daily.    . nitroGLYCERIN (NITROSTAT) 0.4 MG SL tablet Place 1 tablet (0.4 mg total) under the tongue every 5 (five) minutes as needed for chest pain. 25 tablet 3  . ramipril (ALTACE) 10 MG capsule TAKE TWO CAPSULES BY MOUTH DAILY (Patient taking differently: Take 20 mg by mouth daily. ) 180 capsule 0  . rosuvastatin (CRESTOR) 40 MG tablet TAKE ONE TABLET BY MOUTH EVERY NIGHT AT BEDTIME (Patient taking differently: 40 mg daily. ) 90 tablet 0  . sertraline (ZOLOFT) 50 MG tablet Take 50 mg by mouth in the morning and at bedtime.     Marland Kitchen spironolactone (ALDACTONE) 25 MG tablet TAKE ONE TABLET BY MOUTH DAILY (Patient taking differently: Take 25 mg by mouth daily. ) 90 tablet 0   No current facility-administered medications for this visit.    Review of Systems  Constitutional: Negative for fever, malaise/fatigue and weight loss.  Musculoskeletal: Positive for back pain and myalgias.  Neurological: Negative.      PHYSICAL EXAMINATION: BP 136/83 (BP Location: Left Arm, Patient Position: Sitting)   Pulse 65   Resp 18   Ht 5\' 11"  (1.803 m)   Wt 207 lb (93.9 kg)   SpO2 99% Comment: RA with  mask on  BMI 28.87 kg/m  Physical Exam Constitutional:      General: He is not in acute distress.    Appearance: Normal appearance. He is normal weight. He is not ill-appearing.  Eyes:     Extraocular Movements: Extraocular movements intact.     Conjunctiva/sclera: Conjunctivae normal.  Cardiovascular:     Rate and  Rhythm: Normal rate.  Pulmonary:     Effort: No respiratory distress.  Abdominal:     Palpations: Abdomen is soft.  Musculoskeletal:        General: Normal range of motion.     Cervical back: Normal range of motion.  Skin:    General: Skin is warm and dry.  Neurological:     General: No focal deficit present.     Mental Status: He is alert.     Diagnostic Studies & Laboratory data:     Recent Radiology Findings:    Imaging Results  CT Chest W Contrast  Result Date: 04/26/2020 CLINICAL DATA:  Lung nodule EXAM: CT CHEST WITH CONTRAST TECHNIQUE: Multidetector CT imaging of the chest was performed during intravenous contrast administration. CONTRAST:  34mL OMNIPAQUE IOHEXOL 300 MG/ML  SOLN COMPARISON:  None. FINDINGS: Cardiovascular: Coronary artery bypass grafting has been performed. Extensive calcification is seen within the native coronary arteries. Global cardiac size is within normal limits. No pericardial effusion. The central pulmonary arteries are of normal caliber. The thoracic aorta is dilated in its descending segment measuring 3.5 cm at the level of the carina. Moderate atherosclerotic calcification is seen within this segment. Arch vasculature demonstrates normal anatomic configuration and is widely patent proximally. Mediastinum/Nodes: No enlarged mediastinal, hilar, or axillary lymph nodes. Thyroid gland, trachea, and esophagus demonstrate no significant findings. Lungs/Pleura: The opacity seen on recent chest radiograph corresponds to a a 2.4 x 2.5 x 3.1 cm rounded pulmonary mass seen within the left upper lobe in an area of architectural distortion with  areas of focal traction bronchiolectasis and cystic change. This may represent tumefactive scarring related to prior infection, however, an underlying neoplasm, such as a scar carcinoma, could appear similarly. Several perifissural nodules are seen within the left lung base. A densely calcified granuloma is seen at the right lung  base. A 1.1 x 1.2 x 2.1 cm nodule is seen at the left lung base on coronal image # 82/5. No pneumothorax or pleural effusion. Central airways are widely patent. Upper Abdomen: Unremarkable Musculoskeletal: No acute bone abnormality. IMPRESSION: Opacity noted on prior chest radiograph corresponds to a a 3.1 cm pulmonary mass within the left upper lobe. An underlying neoplasm is not excluded and PET CT examination is recommended for further characterization. Indeterminate 2.1 cm nodule within the left lung base. This can be simultaneously assessed with PET CT examination. 3.5 cm descending thoracic aortic aneurysm. Recommend annual imaging followup by CTA or MRA. This recommendation follows 2010 ACCF/AHA/AATS/ACR/ASA/SCA/SCAI/SIR/STS/SVM Guidelines for the Diagnosis and Management of Patients with Thoracic Aortic Disease. Circulation.2010; 121: V400-Q676. Aortic aneurysm NOS (ICD10-I71.9) Aortic aneurysm NOS (ICD10-I71.9). Electronically Signed   By: Fidela Salisbury MD   On: 04/26/2020 23:59   NM PET Image Initial (PI) Skull Base To Thigh  Result Date: 05/14/2020 CLINICAL DATA:  Initial treatment strategy for left upper lobe lung mass. EXAM: NUCLEAR MEDICINE PET SKULL BASE TO THIGH TECHNIQUE: 10.5 mCi F-18 FDG was injected intravenously. Full-ring PET imaging was performed from the skull base to thigh after the radiotracer. CT data was obtained and used for attenuation correction and anatomic localization. Fasting blood glucose: 105 mg/dl COMPARISON:  Chest CT 04/26/2020 FINDINGS: Mediastinal blood pool activity: SUV max 2.32 Liver activity: SUV max NA NECK: No hypermetabolic lymph nodes in the neck. Incidental CT findings: Bilateral carotid artery calcifications noted. CHEST: 2.8 cm left upper lobe lung mass is hypermetabolic and consistent with primary lung neoplasm. SUV max is 15.40. No enlarged or hypermetabolic mediastinal or hilar lymph nodes to suggest metastatic disease. Scattered calcified mediastinal  and hilar lymph nodes are noted and likely due to remote granulomatous disease. No chest wall mass, supraclavicular or axillary adenopathy. No other worrisome pulmonary lesions are identified. Calcified granulomas are noted at the right lung base. Incidental CT findings: Fiducials are noted around the left upper lobe lung mass. Advanced aortic and coronary artery calcifications and evidence of prior coronary artery bypass surgery. ABDOMEN/PELVIS: No abnormal hypermetabolic activity within the liver, pancreas, adrenal glands, or spleen. No hypermetabolic lymph nodes in the abdomen or pelvis. Incidental CT findings: Advanced atherosclerotic calcifications involving abdominal aorta iliac arteries. No aneurysm. Scattered calcified granulomas in the liver and spleen. Hazy/misty appearance of the small bowel mesentery likely due to benign mesenteritis or panniculitis. Small periumbilical abdominal wall hernia containing fat. SKELETON: No focal hypermetabolic activity to suggest skeletal metastasis. Incidental CT findings: No significant findings. IMPRESSION: 1. Hypermetabolic 2.8 cm left upper lobe lung mass consistent with primary lung neoplasm. 2. No mediastinal or hilar lymphadenopathy or evidence of metastatic disease elsewhere. 3. Remote granulomatous disease. 4. Advanced atherosclerotic calcifications involving the thoracic and abdominal aorta and branch vessels including three-vessel coronary artery calcifications. Electronically Signed   By: Marijo Sanes M.D.   On: 05/14/2020 10:41   DG CHEST PORT 1 VIEW  Result Date: 05/09/2020 CLINICAL DATA:  Status post bronchoscopy and biopsy EXAM: PORTABLE CHEST 1 VIEW COMPARISON:  April 16, 2020 chest radiograph; chest CT April 26, 2020 FINDINGS: No pneumothorax. Ill-defined airspace opacity in the left upper lobe at the site of  known mass is similar in appearance. There are clips in this area currently. Elsewhere the lungs are clear. The heart is mildly  enlarged with pulmonary vascularity normal. No adenopathy. There is aortic atherosclerosis. Patient is status post coronary artery bypass grafting. IMPRESSION: No pneumothorax. Airspace opacity in the left upper lobe at the site of mass with surrounding pneumonitis appears essentially stable. Surgical clips now present in this area. No new opacity evident. Note that a nodular lesions seen in the left base on recent CT is not appreciable on this portable radiographic examination. Stable cardiomegaly. Status post coronary artery bypass grafting. Aortic Atherosclerosis (ICD10-I70.0). Electronically Signed   By: Lowella Grip III M.D.   On: 05/09/2020 11:27   DG C-ARM BRONCHOSCOPY  Result Date: 05/09/2020 C-ARM BRONCHOSCOPY: Fluoroscopy was utilized by the requesting physician.  No radiographic interpretation.        I have independently reviewed the above radiology studies  and reviewed the findings with the patient.   Recent Lab Findings: Recent Labs       Lab Results  Component Value Date   WBC 5.4 05/09/2020   HGB 13.7 05/09/2020   HCT 41.3 05/09/2020   PLT 152 05/09/2020   GLUCOSE 108 (H) 05/09/2020   CHOL 122 02/05/2020   TRIG 75 02/05/2020   HDL 40 02/05/2020   LDLCALC 67 02/05/2020   ALT 24 05/09/2020   AST 26 05/09/2020   NA 140 05/09/2020   K 3.9 05/09/2020   CL 105 05/09/2020   CREATININE 0.96 05/09/2020   BUN 21 05/09/2020   CO2 24 05/09/2020   INR 1.0 05/09/2020       PFTs: - FVC: 95% - FEV1: 94% -DLCO: 106%  Problem List: 2.8 cm left upper lobe biopsy-proven non-small cell cancer with an SUV of 15 History of coronary artery bypass grafting with the use of the left internal thoracic artery.  Assessment / Plan:   74 year old male with a biopsy-proven 2.8 cm left upper lobe non-small cell cancer.  On review of his imaging he does not appear to have any avid lymph nodes in the hilum and mediastinum.  We discussed several options  including SBRT and surgical resection.  The patient would like to proceed with the surgical resection via a left robotic assisted thoracoscopy and left upper lobectomy.  I explained to have that given the fact that he is already had a left internal thoracic artery harvesting there may be some scar tissue and adhesions in the left chest.  I reassured him that the priority will be to safely mobilize the lung off of the chest wall without injuring this vessel.  He is also on Plavix and this will need to be held.

## 2020-05-22 NOTE — Anesthesia Postprocedure Evaluation (Signed)
Anesthesia Post Note  Patient: John Hinton  Procedure(s) Performed: XI ROBOTIC ASSISTED THORASCOPY-LEFT UPPER LOBECTOMY (Left Chest) INTERCOSTAL NERVE BLOCK (Left Chest) NODE DISSECTION (N/A Chest)     Patient location during evaluation: PACU Anesthesia Type: General Level of consciousness: awake and alert, patient cooperative and oriented Pain management: pain level controlled Vital Signs Assessment: post-procedure vital signs reviewed and stable Respiratory status: spontaneous breathing, nonlabored ventilation, respiratory function stable and patient connected to nasal cannula oxygen Cardiovascular status: blood pressure returned to baseline and stable Postop Assessment: no apparent nausea or vomiting Anesthetic complications: no   No complications documented.  Last Vitals:  Vitals:   05/22/20 1430 05/22/20 1445  BP: 122/79 109/76  Pulse: 89 90  Resp: 18 16  Temp:    SpO2: 94% 94%    Last Pain:  Vitals:   05/22/20 1445  TempSrc:   PainSc: 0-No pain                 Kemontae Dunklee,E. Deionna Marcantonio

## 2020-05-22 NOTE — Anesthesia Postprocedure Evaluation (Signed)
Anesthesia Post Note  Patient: John Hinton  Procedure(s) Performed: XI ROBOTIC ASSISTED THORASCOPY-LEFT UPPER LOBECTOMY (Left Chest) INTERCOSTAL NERVE BLOCK (Left Chest) NODE DISSECTION (N/A Chest)     Patient location during evaluation: PACU Anesthesia Type: General Level of consciousness: awake and alert, patient cooperative and oriented Pain management: pain level controlled Vital Signs Assessment: post-procedure vital signs reviewed and stable Respiratory status: spontaneous breathing, nonlabored ventilation, respiratory function stable and patient connected to nasal cannula oxygen Cardiovascular status: blood pressure returned to baseline and stable Postop Assessment: no apparent nausea or vomiting Anesthetic complications: no   No complications documented.  Last Vitals:  Vitals:   05/22/20 1800 05/22/20 1856  BP: (!) 142/86 (!) 142/86  Pulse: 95   Resp: 18   Temp:    SpO2: 94%     Last Pain:  Vitals:   05/22/20 1732  TempSrc: Oral  PainSc: 7                  Swathi Dauphin,E. Weronika Birch

## 2020-05-22 NOTE — Brief Op Note (Signed)
05/22/2020  1:36 PM  PATIENT:  [name]  74 y.o. male  PRE-OPERATIVE DIAGNOSIS:  LUNG CANCER  POST-OPERATIVE DIAGNOSIS:  LUNG CANCER  PROCEDURE:  Procedure(s): XI ROBOTIC ASSISTED THORASCOPY-LEFT UPPER LOBECTOMY (Left) INTERCOSTAL NERVE BLOCK (Left) NODE DISSECTION (N/A)  SURGEON:  Surgeon(s) and Role:    * Lightfoot, Lucile Crater, MD - Primary  PHYSICIAN ASSISTANT: Kirkland Hun  ANESTHESIA:   general  EBL:  200 mL   BLOOD ADMINISTERED:none  DRAINS: 108fr left pleural tube   LOCAL MEDICATIONS USED:  Left intercostal Exparel  SPECIMEN:  Source of Specimen:  Left upper lung lobe, multi-level lymph nodes.  DISPOSITION OF SPECIMEN:  PATHOLOGY  COUNTS:  YES  DICTATION: .Dragon Dictation  PLAN OF CARE: Admit to inpatient   PATIENT DISPOSITION:  PACU - hemodynamically stable.   Delay start of Pharmacological VTE agent (>24hrs) due to surgical blood loss or risk of bleeding: no

## 2020-05-22 NOTE — Anesthesia Procedure Notes (Signed)
Procedure Name: Intubation Date/Time: 05/22/2020 8:43 AM Performed by: Terrence Dupont, RN Pre-anesthesia Checklist: Patient identified, Emergency Drugs available, Suction available and Patient being monitored Patient Re-evaluated:Patient Re-evaluated prior to induction Oxygen Delivery Method: Circle system utilized Preoxygenation: Pre-oxygenation with 100% oxygen Induction Type: IV induction Ventilation: Mask ventilation without difficulty Laryngoscope Size: Mac and 4 Grade View: Grade I Tube type: Oral Endobronchial tube: Left, EBT position confirmed by auscultation, Double lumen EBT and EBT position confirmed by fiberoptic bronchoscope and 39 Fr Number of attempts: 1 Airway Equipment and Method: Stylet Placement Confirmation: ETT inserted through vocal cords under direct vision,  positive ETCO2 and breath sounds checked- equal and bilateral Tube secured with: Tape Dental Injury: Teeth and Oropharynx as per pre-operative assessment

## 2020-05-22 NOTE — Transfer of Care (Signed)
Immediate Anesthesia Transfer of Care Note  Patient: John Hinton  Procedure(s) Performed: XI ROBOTIC ASSISTED THORASCOPY-LEFT UPPER LOBECTOMY (Left Chest) INTERCOSTAL NERVE BLOCK (Left Chest) NODE DISSECTION (N/A Chest)  Patient Location: PACU  Anesthesia Type:General  Level of Consciousness: awake, alert  and oriented  Airway & Oxygen Therapy: Patient connected to face mask oxygen  Post-op Assessment: Post -op Vital signs reviewed and stable  Post vital signs: stable  Last Vitals:  Vitals Value Taken Time  BP    Temp    Pulse    Resp    SpO2      Last Pain:  Vitals:   05/22/20 0648  TempSrc: Temporal         Complications: No complications documented.

## 2020-05-22 NOTE — OR Nursing (Signed)
Pt is awake,alert and oriented.Pt and/or family verbalized understanding of poc and discharge instructions. Reviewed admission and on going care with receiving RN. Pt is in NAD at this time and is ready to be transferred to floor. Will con't to monitor until pt is transferred. Belongings on bed with patient To floor with Tech and RN on monitor

## 2020-05-23 ENCOUNTER — Other Ambulatory Visit: Payer: Self-pay | Admitting: *Deleted

## 2020-05-23 ENCOUNTER — Inpatient Hospital Stay (HOSPITAL_COMMUNITY): Payer: Medicare Other

## 2020-05-23 ENCOUNTER — Encounter (HOSPITAL_COMMUNITY): Payer: Self-pay | Admitting: Thoracic Surgery (Cardiothoracic Vascular Surgery)

## 2020-05-23 LAB — BASIC METABOLIC PANEL
Anion gap: 10 (ref 5–15)
BUN: 16 mg/dL (ref 8–23)
CO2: 26 mmol/L (ref 22–32)
Calcium: 8.7 mg/dL — ABNORMAL LOW (ref 8.9–10.3)
Chloride: 101 mmol/L (ref 98–111)
Creatinine, Ser: 1.26 mg/dL — ABNORMAL HIGH (ref 0.61–1.24)
GFR, Estimated: >60 mL/min (ref >60)
Glucose, Bld: 163 mg/dL — ABNORMAL HIGH (ref 70–99)
Potassium: 4.2 mmol/L (ref 3.5–5.1)
Sodium: 137 mmol/L (ref 135–145)

## 2020-05-23 LAB — CBC
HCT: 34.3 % — ABNORMAL LOW (ref 39.0–52.0)
Hemoglobin: 11.4 g/dL — ABNORMAL LOW (ref 13.0–17.0)
MCH: 33.7 pg (ref 26.0–34.0)
MCHC: 33.2 g/dL (ref 30.0–36.0)
MCV: 101.5 fL — ABNORMAL HIGH (ref 80.0–100.0)
Platelets: 144 10*3/uL — ABNORMAL LOW (ref 150–400)
RBC: 3.38 MIL/uL — ABNORMAL LOW (ref 4.22–5.81)
RDW: 13 % (ref 11.5–15.5)
WBC: 8.6 10*3/uL (ref 4.0–10.5)
nRBC: 0 % (ref 0.0–0.2)

## 2020-05-23 IMAGING — DX DG CHEST 1V PORT
1 series · 1 of 1 positions shown · non-contrast
Comparison: Chest radiograph from one day prior.

CLINICAL DATA: Lobectomy, chest 2

EXAM:
PORTABLE CHEST 1 VIEW

[chest ap]
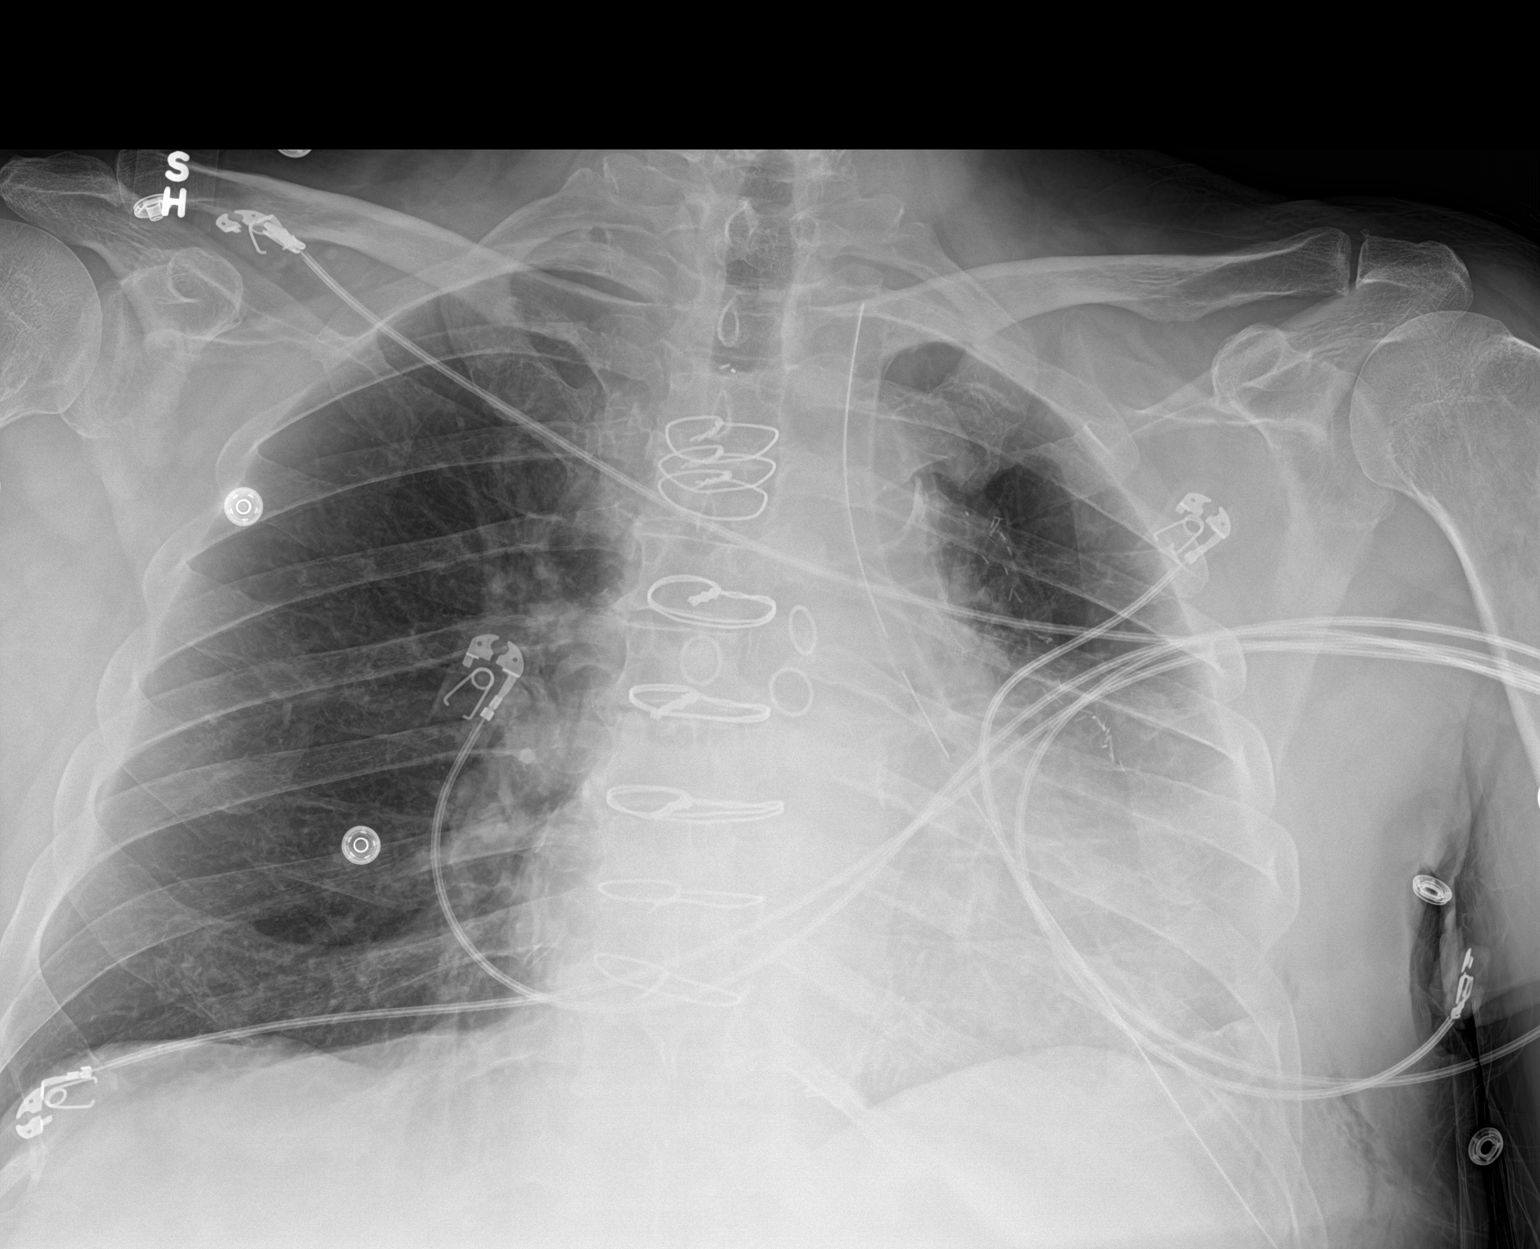

[1 of 1 positions shown; findings below may reference images not displayed]

FINDINGS: Left apical chest tube in place. Interval extubation. Intact
sternotomy wires. Stable cardiomediastinal silhouette with
top-normal heart size. No pneumothorax. No pleural effusion. Mild
subcutaneous emphysema in the lower left chest wall. Mild left
hemithorax volume loss. Mild streaky left lung base atelectasis.
Clear right lung.
IMPRESSION: 1. No pneumothorax. Left apical chest tube in place.
2. Mild left hemithorax volume loss with mild streaky left lung base
atelectasis.

## 2020-05-23 NOTE — Progress Notes (Addendum)
      [address]       [place] [address]             [phone]      1 Day Post-Op Procedure(s) (LRB): XI ROBOTIC ASSISTED THORASCOPY-LEFT UPPER LOBECTOMY (Left) INTERCOSTAL NERVE BLOCK (Left) NODE DISSECTION (N/A)   Subjective:  Patient sitting up in bed.  He states overall he is doing pretty good.  His pain is well controlled.  He denies N/V.  Objective: Vital signs in last 24 hours: Temp:  [96.6 F (35.9 C)-99.3 F (37.4 C)] 98.2 F (36.8 C) (11/04 0336) Pulse Rate:  [65-98] 65 (11/04 0336) Cardiac Rhythm: Normal sinus rhythm (11/04 0417) Resp:  [14-23] 15 (11/04 0336) BP: (109-148)/(65-89) 112/65 (11/04 0336) SpO2:  [92 %-100 %] 94 % (11/04 0336)  Intake/Output from previous day: 11/03 0701 - 11/04 0700 In: 2750 [I.V.:2000; IV Piggyback:750] Out: 1300 [Urine:650; Blood:200; Chest Tube:450]  General appearance: alert, cooperative and no distress Heart: regular rate and rhythm Lungs: diminished breath sounds diminshed on left Abdomen: soft, non-tender; bowel sounds normal; no masses,  no organomegaly Extremities: extremities normal, atraumatic, no cyanosis or edema Wound: clean and dry  Lab Results: Recent Labs    05/21/20 1400 05/21/20 1400 05/22/20 1254 05/23/20 0012  WBC 5.8  --   --  8.6  HGB 13.6   < > 11.2* 11.4*  HCT 41.3   < > 33.0* 34.3*  PLT 148*  --   --  144*   < > = values in this interval not displayed.   BMET:  Recent Labs    05/21/20 1400 05/21/20 1400 05/22/20 1254 05/23/20 0012  NA 139   < > 141 137  K 4.0   < > 4.1 4.2  CL 106  --   --  101  CO2 23  --   --  26  GLUCOSE 104*  --   --  163*  BUN 21  --   --  16  CREATININE 0.86  --   --  1.26*  CALCIUM 9.4  --   --  8.7*   < > = values in this interval not displayed.    PT/INR:  Recent Labs    05/21/20 1400  LABPROT 13.0  INR 1.0   ABG    Component Value Date/Time   PHART 7.264 (L) 05/22/2020 1254   HCO3 22.9 05/22/2020 1254   TCO2 24  05/22/2020 1254   ACIDBASEDEF 5.0 (H) 05/22/2020 1254   O2SAT 98.0 05/22/2020 1254   CBG (last 3)  No results for input(s): GLUCAP in the last 72 hours.  Assessment/Plan: S/P Procedure(s) (LRB): XI ROBOTIC ASSISTED THORASCOPY-LEFT UPPER LOBECTOMY (Left) INTERCOSTAL NERVE BLOCK (Left) NODE DISSECTION (N/A)  1. CV- H/O CABG, in NSR and BP stable- on Cardura, Altace, Aldactone, and Crestor 2. Pulm- CT on suction with 1+ air leak, CXR with atelectasis on left, leave chest tube to suction today 3. Renal- creatinine at 1.26, likely due to Toradol use, will stop repeat BMET in AM 4. Expected post operative blood loss anemia, minimal hgb at 11.4 5. lovenox for DVT prophylaxis 6. Dispo- patient stable, small 1+ air leak on suction.Marland Kitchen leave chest tube to suction today, repeat CXR in AM   LOS: 1 day    Ellwood Handler, PA-C 05/23/2020  Doing well. Small air leak.  Intermittent with a cough. We will transition to waterseal today.  Melinda Gwinner Bary Leriche

## 2020-05-23 NOTE — Discharge Summary (Signed)
Physician Discharge Summary  Patient ID: John Hinton MRN: [id number] DOB/AGE: 1945-09-20 74 y.o.  Admit date: 05/22/2020 Discharge date: 05/24/2020  Admission Diagnoses:  Non-small cell lung cancer Hypertension Coronary artery Disease Dilated cardiomyopathy History of right bundle branch block History of obstructive sleep apnea   Discharge Diagnoses:   Non-small cell lung cancer Hypertension Coronary artery Disease Dilated cardiomyopathy History of right bundle branch block History of obstructive sleep apnea  S/P left upper lobectomy of lung with lymph node sampling  Discharged Condition: stable  History of Present Illness:  John Hinton y.o.malereferred for surgical evaluation of a left upper lobe nodule that was PET avid. He also underwent a navigational bronchoscopy with Dr. Valeta Harms and the biopsy did show non-small cell lung cancer. He denies any respiratory symptoms. His weight has been stable, and he denies any neurologic symptoms. He does have a history of coronary artery disease and underwent CABG by Dr. Prescott Gum 14 years ago. The nodule was found incidentally on cross-sectional imaging when he had some mild respiratory symptoms.We discussed several options including SBRT and surgical resection. The patient would like to proceed with the surgical resection via a left robotic assisted thoracoscopy and left upper lobectomy. I explained to have that given the fact that he is already had a left internal thoracic artery harvesting there may be some scar tissue and adhesions in the left chest. I reassured him that the priority will be to safely mobilize the lung off of the chest wall without injuring this vessel. He is also on Plavix and this will need to be held.    Hospital Course:   John Hinton was admitted for elective surgery on 05/22/20 and taken to the OR where robotic-assisted left upper lobectomy was performed along with mediastinal lymph node  sampling and multi-level intercostal block.  Following the procedure, he was extubated and recovered in the PACU and was later transferred to [facility]. His respiratory status remained stable. He had a small air leak on the first post-op day and his chest tube was placed to water seal. He was started back on his multiple anti-hypertensive medications.  He was also started on daily subcutaneous enoxaparin for DVT prophylaxis.  He was mobilized on the first post-op day. Diet and activity were advanced as tolerated.  The chest tube was removed on 05/24/20 after a 4-hour clamp trial.  Final path showed pathologic Stage Classification (pTNM, AJCC 8th Edition): pT3, pN2, pM1a  Adenocarcinoma.  Consults: None   Significant Diagnostic Studies:   Imaging Results  CT Chest W Contrast  Result Date: 04/26/2020 CLINICAL DATA: Lung nodule EXAM: CT CHEST WITH CONTRAST TECHNIQUE: Multidetector CT imaging of the chest was performed during intravenous contrast administration. CONTRAST: 3mL OMNIPAQUE IOHEXOL 300 MG/ML SOLN COMPARISON: None. FINDINGS: Cardiovascular: Coronary artery bypass grafting has been performed. Extensive calcification is seen within the native coronary arteries. Global cardiac size is within normal limits. No pericardial effusion. The central pulmonary arteries are of normal caliber. The thoracic aorta is dilated in its descending segment measuring 3.5 cm at the level of the carina. Moderate atherosclerotic calcification is seen within this segment. Arch vasculature demonstrates normal anatomic configuration and is widely patent proximally. Mediastinum/Nodes: No enlarged mediastinal, hilar, or axillary lymph nodes. Thyroid gland, trachea, and esophagus demonstrate no significant findings. Lungs/Pleura: The opacity seen on recent chest radiograph corresponds to a a 2.4 x 2.5 x 3.1 cm rounded pulmonary mass seen within the left upper lobe in an area of architectural distortion with areas  of focal traction bronchiolectasis and cystic change. This may represent tumefactive scarring related to prior infection, however, an underlying neoplasm, such as a scar carcinoma, could appear similarly. Several perifissural nodules are seen within the left lung base. A densely calcified granuloma is seen at the right lung base. A 1.1 x 1.2 x 2.1 cm nodule is seen at the left lung base on coronal image # 82/5. No pneumothorax or pleural effusion. Central airways are widely patent. Upper Abdomen: Unremarkable Musculoskeletal: No acute bone abnormality. IMPRESSION: Opacity noted on prior chest radiograph corresponds to a a 3.1 cm pulmonary mass within the left upper lobe. An underlying neoplasm is not excluded and PET CT examination is recommended for further characterization. Indeterminate 2.1 cm nodule within the left lung base. This can be simultaneously assessed with PET CT examination. 3.5 cm descending thoracic aortic aneurysm. Recommend annual imaging followup by CTA or MRA. This recommendation follows 2010 ACCF/AHA/AATS/ACR/ASA/SCA/SCAI/SIR/STS/SVM Guidelines for the Diagnosis and Management of Patients with Thoracic Aortic Disease. Circulation.2010; 121: T465-K812. Aortic aneurysm NOS (ICD10-I71.9) Aortic aneurysm NOS (ICD10-I71.9). Electronically Signed By: Fidela Salisbury MD On: 04/26/2020 23:59   NM PET Image Initial (PI) Skull Base To Thigh  Result Date: 05/14/2020 CLINICAL DATA: Initial treatment strategy for left upper lobe lung mass. EXAM: NUCLEAR MEDICINE PET SKULL BASE TO THIGH TECHNIQUE: 10.5 mCi F-18 FDG was injected intravenously. Full-ring PET imaging was performed from the skull base to thigh after the radiotracer. CT data was obtained and used for attenuation correction and anatomic localization. Fasting blood glucose: 105 mg/dl COMPARISON: Chest CT 04/26/2020 FINDINGS: Mediastinal blood pool activity: SUV max 2.32 Liver activity: SUV max NA NECK: No hypermetabolic lymph nodes in  the neck. Incidental CT findings: Bilateral carotid artery calcifications noted. CHEST: 2.8 cm left upper lobe lung mass is hypermetabolic and consistent with primary lung neoplasm. SUV max is 15.40. No enlarged or hypermetabolic mediastinal or hilar lymph nodes to suggest metastatic disease. Scattered calcified mediastinal and hilar lymph nodes are noted and likely due to remote granulomatous disease. No chest wall mass, supraclavicular or axillary adenopathy. No other worrisome pulmonary lesions are identified. Calcified granulomas are noted at the right lung base. Incidental CT findings: Fiducials are noted around the left upper lobe lung mass. Advanced aortic and coronary artery calcifications and evidence of prior coronary artery bypass surgery. ABDOMEN/PELVIS: No abnormal hypermetabolic activity within the liver, pancreas, adrenal glands, or spleen. No hypermetabolic lymph nodes in the abdomen or pelvis. Incidental CT findings: Advanced atherosclerotic calcifications involving abdominal aorta iliac arteries. No aneurysm. Scattered calcified granulomas in the liver and spleen. Hazy/misty appearance of the small bowel mesentery likely due to benign mesenteritis or panniculitis. Small periumbilical abdominal wall hernia containing fat. SKELETON: No focal hypermetabolic activity to suggest skeletal metastasis. Incidental CT findings: No significant findings. IMPRESSION: 1. Hypermetabolic 2.8 cm left upper lobe lung mass consistent with primary lung neoplasm. 2. No mediastinal or hilar lymphadenopathy or evidence of metastatic disease elsewhere. 3. Remote granulomatous disease. 4. Advanced atherosclerotic calcifications involving the thoracic and abdominal aorta and branch vessels including three-vessel coronary artery calcifications. Electronically Signed By: Marijo Sanes M.D. On: 05/14/2020 10:41   DG CHEST PORT 1 VIEW  Result Date: 05/09/2020 CLINICAL DATA: Status post bronchoscopy and biopsy EXAM:  PORTABLE CHEST 1 VIEW COMPARISON: April 16, 2020 chest radiograph; chest CT April 26, 2020 FINDINGS: No pneumothorax. Ill-defined airspace opacity in the left upper lobe at the site of known mass is similar in appearance. There are clips in this area currently. Elsewhere the  lungs are clear. The heart is mildly enlarged with pulmonary vascularity normal. No adenopathy. There is aortic atherosclerosis. Patient is status post coronary artery bypass grafting. IMPRESSION: No pneumothorax. Airspace opacity in the left upper lobe at the site of mass with surrounding pneumonitis appears essentially stable. Surgical clips now present in this area. No new opacity evident. Note that a nodular lesions seen in the left base on recent CT is not appreciable on this portable radiographic examination. Stable cardiomegaly. Status post coronary artery bypass grafting. Aortic Atherosclerosis (ICD10-I70.0). Electronically Signed By: Lowella Grip III M.D. On: 05/09/2020      Treatments:   05/22/2020  Patient:  John Hinton Pre-Op Dx: Left upper lobe NSCLC   Post-op Dx:  same Procedure:  - Robotic assisted Left video thoracoscopy - Left upper lobectomy - Mediastinal lymph node sampling - Intercostal nerve block  Surgeon and Role:      * Lightfoot, Lucile Crater, MD - Primary    * M. Lakota Schweppe, PA-C - assisting  Anesthesia  general EBL:  200 ml Blood Administration: none Specimen:  Left upper lobe.  Hilar lymph nodes.  Level 9 nodes  Drains: 73 F argyle chest tube in left chest Counts: correct   Indications:  74 year old male with a biopsy-proven 2.8 cm left upper lobe non-small cell cancer. On review of his imaging he does not appear to have any avid lymph nodes in the hilum and mediastinum. We discussed several options including SBRT and surgical resection. The patient would like to proceed with the surgical resection via a left robotic assisted thoracoscopy and left upper  lobectomy. I explained to have that given the fact that he is already had a left internal thoracic artery harvesting there may be some scar tissue and adhesions in the left chest. I reassured him that the priority will be to safely mobilize the lung off of the chest wall without injuring this vessel. He is also on Plavix and this will need to be held.  Findings: The left upper lobe was densely adherent to the anterior chest wall, and mammary vessel from his previous CABG.  We worked in a posterior to anterior fashion first isolating the pulmonary artery branches.  The bronchus was then identified, and divided.  The lung was then mobilized off of the chest wall.  A wedge resection of the lung was required to separate it from the entry of the LIMA into the pericardium, and at the site of the phrenic nerve slightly more posteriorly.  Thus 2 small portions of the left upper lobe were left in the cavity as they were densely adherent to the LIMA and phrenic nerve.  Both were far from the tumor.  I was then able to identify then identify the upper lobe vein.       SURGICAL PATHOLOGY  CASE: [id number]  PATIENT: John Hinton  Surgical Pathology Report   Clinical History: lung cancer (cm)   FINAL MICROSCOPIC DIAGNOSIS:   A. LYMPH NODE, HILAR #1, EXCISION:  - One of one lymph nodes negative for carcinoma (0/1).   B. LYMPH NODE, HILAR #2, EXCISION:  - Benign fibroadipose tissue.  - No lymphoid tissue present.   C. LYMPH NODE, 9, EXCISION:  - Metastatic carcinoma in one of one lymph nodes (1/1).   D. LYMPH NODE, 9 #2, EXCISION:  - Metastatic carcinoma in one of one lymph nodes (1/1).   E. LYMPH NODE, HILAR #3, EXCISION:  - Metastatic carcinoma in one of one lymph nodes (1/1).   F.  LYMPH NODE, HILAR #4, EXCISION:  - Metastatic carcinoma in one of one lymph nodes (1/1).   G. LUNG, LEFT UPPER LOBE, LOBECTOMY:  - Adenocarcinoma, moderately differentiated, acinar predominant,   spanning 5.7 cm.  - Tumor invades pleura.  - Multiple pleural tumor deposits.  - Resection margins are negative.  - See oncology table.   H. LYMPH NODE, HILAR #5, EXCISION:  - Metastatic carcinoma in one of one lymph nodes (1/1).   I. LYMPH NODE, HILAR #6, EXCISION:  - One of one lymph nodes negative for carcinoma (0/1).  J. LYMPH NODE, HILAR #7, EXCISION:  - One of one lymph nodes negative for carcinoma (0/1).   K. LYMPH NODE, HILAR #8, EXCISION:  - Metastatic carcinoma in one of one lymph nodes (1/1).   ONCOLOGY TABLE:   LUNG: Resection   Procedure: Left upper lobe lobectomy with lymph node sampling  Specimen Laterality: Left  Tumor Site: Left upper lobe  Tumor Size: 5.7 cm  Tumor Focality: Unifocal, see comment  Histologic Type: Adenocarcinoma, acinar predominant  Visceral Pleura Invasion: Present  Lymphovascular Invasion: Present  Direct Invasion of Adjacent Structures: No adjacent structures present  Margins: Uninvolved by tumor  Treatment Effect: No know presurgical therapy  Regional Lymph Nodes:    Number of Lymph Nodes Involved: 6    Number of Lymph Nodes Examined: 9  Pathologic Stage Classification (pTNM, AJCC 8th Edition): pT3, pN2, pM1a  Ancillary Studies: Can be performed upon request  Representative Tumor Block: G1  Comment(s): There is a single intraparenchymal focus and multiple  discrete pleural deposits.   (v4.1.0.1)    GROSS DESCRIPTION:   Specimen A: Received fresh is a 0.4 x 0.4 x 0.1 cm pale yellow-white to  black soft tissue, in toto 1 block.   Specimen B: Received fresh is a 0.7 x 0.15 x 0.1 cm brown-black soft  tissue, in toto 1 block.   Specimen C: Received fresh is a 0.6 x 0.5 x 0.1 cm pale yellow-white to  black soft tissue, in toto 1 block.   Specimen D: Received fresh is a 0.7 x 0.6 x 0.1 cm yellow-black soft  tissue, in toto 1 block.   Specimen E: Received fresh is a 0.6 x 0.6 x 0.1 cm yellow-red to black  soft tissue,  in toto 1 block.   Specimen F: Received fresh is a 0.6 x 0.6 x 0.2 cm tan-yellow to black  soft tissue, sectioned and submitted 1 block.   Specimen G: Lung, left upper lobe, received fresh and placed in formalin  at 1423 hrs.  Specimen integrity: Disrupted adjacent to the hilum over a 6 x 3 cm  area.  Size, weight: 156 g, 18 x 8 x 4.4 cm  Pleura: Tan-pink to pink-purple, slightly anthracotic, indurated and  umbilicated over the anterior segment.  Lesion: Beneath the area of indurated, umbilicated pleura and extending  into the apical posterior segment is a 5.7 x 3.4 x 2.7 cm tan-white to  anthracotic firm ill-defined subpleural mass, which has an adjacent  embedded gold colored cylindrical device 0.3 x 0.1 cm, with attached  silver-colored wire at 1 end.  Margin(s): The mass is 4 cm from the bronchial margin of resection. No  discrete lesions or masses involving bronchial margin.  Hilar vessels: Unremarkable  Nonneoplastic parenchyma: Pink-red, spongy with several scattered areas  of firm well-defined subpleural nodularity which are up to 0.7 cm in  diameter and have gray-white cut surfaces.  Lymph nodes: None identified  Block Summary:  Blocks 1, 2 = mass with overlying umbilicated pleura  Block 3 = mass and adjacent parenchyma  Block 4 = tissue for molecular testing  Block 5 = bronchial margin  Block 6 = vascular margins  Block 7 = sections of firm subpleural nodularity   Specimen H: Received fresh is a 0.5 x 0.5 x 0.3 cm soft anthracotic  nodule, in toto 1 block.   Specimen I: Received fresh is a 0.6 x 0.6 x 0.2 cm yellow to anthracotic  soft tissue, in toto 1 block.   Specimen J: Received fresh is a 0.5 x 0.4 x 0.3 cm anthracotic soft  nodule, in toto 1 block.   Specimen K: Received fresh is a 0.3 x 0.25 x 0.1 cm yellow brown-black  soft tissue, in toto 1 block.   SW 05/23/2020     Final Diagnosis performed by Vicente Males, MD.  Electronically signed  05/24/2020   Technical component performed at [facility]. [facility], [place] [address] [place], [place] [address].  Professional component performed at [facility],  [place] [address], [place], [place] [address].  Immunohistochemistry Technical component (if applicable) was performed  at [facility]. [address], [place],  [place], [place] [address].  IMMUNOHISTOCHEMISTRY DISCLAIMER (if applicable):  Some of these immunohistochemical stains may have been developed and the  performance characteristics determine by [facility]. Some  may not have been cleared or approved by the U.S. Food and Drug  Administration. The FDA has determined that such clearance or approval  is not necessary. This test is used for clinical purposes. It should not  be regarded as investigational or for research. This laboratory is  certified under the [employer]  (CLIA-88) as qualified to perform high complexity clinical laboratory  testing. The controls stained appropriately.   Discharge Exam: Blood pressure 110/72, pulse 63, temperature 97.9 F (36.6 C), temperature source Oral, resp. rate 18, height 5\' 11"  (1.803 m), weight 93 kg, SpO2 96 %.  General appearance: alert, cooperative and no distress Heart: regular rate and rhythm Lungs: Breath sounds clear, moderate air leak with cough. CXR stable. No PTX. Abdomen: soft, non-tender; bowel sounds normal; no masses,  no organomegaly Extremities: extremities normal, atraumatic, no cyanosis or edema Wound: clean and dry  Disposition:   Discharged to home in stable condition.         Allergies as of 05/24/2020      Reactions   Amlodipine Swelling   Edema      Medication List    TAKE these medications   aspirin 81 MG tablet Take 81 mg by mouth at bedtime.   B-12 PO Take 1,000 mcg by mouth daily.   cholecalciferol 1000 units tablet Commonly known as:  VITAMIN D Take 1,000 Units by mouth at bedtime.   clopidogrel 75 MG tablet Commonly known as: PLAVIX TAKE ONE TABLET BY MOUTH EVERY MORNING WITH BREAKFAST What changed: See the new instructions.   doxazosin 2 MG tablet Commonly known as: CARDURA Take 1 tablet (2 mg total) by mouth daily. What changed: when to take this   ezetimibe 10 MG tablet Commonly known as: ZETIA TAKE ONE TABLET BY MOUTH EVERY NIGHT AT BEDTIME What changed: when to take this   folic acid 253 MCG tablet Commonly known as: FOLVITE Take 400 mcg by mouth daily.   multivitamin tablet Take 1 tablet by mouth daily.   nitroGLYCERIN 0.4 MG SL tablet Commonly known as: NITROSTAT  Place 1 tablet (0.4 mg total) under the tongue every 5 (five) minutes as needed for chest pain.   ramipril 10 MG capsule Commonly known as: ALTACE TAKE TWO CAPSULES BY MOUTH DAILY   rosuvastatin 40 MG tablet Commonly known as: CRESTOR TAKE ONE TABLET BY MOUTH EVERY NIGHT AT BEDTIME What changed:   how to take this  when to take this   sertraline 50 MG tablet Commonly known as: ZOLOFT Take 50 mg by mouth in the morning and at bedtime.   spironolactone 25 MG tablet Commonly known as: ALDACTONE TAKE ONE TABLET BY MOUTH DAILY   traMADol 50 MG tablet Commonly known as: ULTRAM Take 1 tablet (50 mg total) by mouth every 6 (six) hours as needed for up to 5 days (mild pain).       Follow-up Information    Lajuana Matte, MD. Go on 05/30/2020.   Specialty: Cardiothoracic Surgery Why: Your appointment is on Thursday, 05/30/20 at 2:15pm.  Contact information: [address] [place] [address] [phone]               Signed: Antony Odea, PA-C 05/24/2020, 1:45 PM

## 2020-05-23 NOTE — Plan of Care (Signed)
Problem: Education: Goal: Knowledge of General Education information will improve Description: Including pain rating scale, medication(s)/side effects and non-pharmacologic comfort measures 05/23/2020 0758 by Shanon Ace, RN Outcome: Progressing 05/22/2020 1819 by Shanon Ace, RN Outcome: Progressing   Problem: Health Behavior/Discharge Planning: Goal: Ability to manage health-related needs will improve 05/23/2020 0758 by Shanon Ace, RN Outcome: Progressing 05/22/2020 1819 by Shanon Ace, RN Outcome: Progressing   Problem: Clinical Measurements: Goal: Ability to maintain clinical measurements within normal limits will improve 05/23/2020 0758 by Shanon Ace, RN Outcome: Progressing 05/22/2020 1819 by Shanon Ace, RN Outcome: Progressing Goal: Will remain free from infection 05/23/2020 0758 by Shanon Ace, RN Outcome: Progressing 05/22/2020 1819 by Shanon Ace, RN Outcome: Progressing Goal: Diagnostic test results will improve 05/23/2020 0758 by Shanon Ace, RN Outcome: Progressing 05/22/2020 1819 by Shanon Ace, RN Outcome: Progressing Goal: Respiratory complications will improve 05/23/2020 0758 by Shanon Ace, RN Outcome: Progressing 05/22/2020 1819 by Shanon Ace, RN Outcome: Progressing Goal: Cardiovascular complication will be avoided 05/23/2020 0758 by Shanon Ace, RN Outcome: Progressing 05/22/2020 1819 by Shanon Ace, RN Outcome: Progressing   Problem: Activity: Goal: Risk for activity intolerance will decrease 05/23/2020 0758 by Shanon Ace, RN Outcome: Progressing 05/22/2020 1819 by Shanon Ace, RN Outcome: Progressing   Problem: Nutrition: Goal: Adequate nutrition will be maintained 05/23/2020 0758 by Shanon Ace, RN Outcome: Progressing 05/22/2020 1819 by Shanon Ace, RN Outcome: Progressing   Problem: Coping: Goal: Level of anxiety will decrease 05/23/2020 0758 by Shanon Ace, RN Outcome: Progressing 05/22/2020 1819 by Shanon Ace, RN Outcome: Progressing   Problem: Elimination: Goal: Will not experience complications related to bowel motility 05/23/2020 0758 by Shanon Ace, RN Outcome: Progressing 05/22/2020 1819 by Shanon Ace, RN Outcome: Progressing Goal: Will not experience complications related to urinary retention 05/23/2020 0758 by Shanon Ace, RN Outcome: Progressing 05/22/2020 1819 by Shanon Ace, RN Outcome: Progressing   Problem: Pain Managment: Goal: General experience of comfort will improve 05/23/2020 0758 by Shanon Ace, RN Outcome: Progressing 05/22/2020 1819 by Shanon Ace, RN Outcome: Progressing   Problem: Safety: Goal: Ability to remain free from injury will improve 05/23/2020 0758 by Shanon Ace, RN Outcome: Progressing 05/22/2020 1819 by Shanon Ace, RN Outcome: Progressing   Problem: Skin Integrity: Goal: Risk for impaired skin integrity will decrease 05/23/2020 0758 by Shanon Ace, RN Outcome: Progressing 05/22/2020 1819 by Shanon Ace, RN Outcome: Progressing   Problem: Education: Goal: Knowledge of disease or condition will improve 05/23/2020 0758 by Shanon Ace, RN Outcome: Progressing 05/22/2020 1819 by Shanon Ace, RN Outcome: Progressing Goal: Knowledge of the prescribed therapeutic regimen will improve 05/23/2020 0758 by Shanon Ace, RN Outcome: Progressing 05/22/2020 1819 by Shanon Ace, RN Outcome: Progressing   Problem: Activity: Goal: Risk for activity intolerance will decrease 05/23/2020 0758 by Shanon Ace, RN Outcome: Progressing 05/22/2020 1819 by Shanon Ace, RN Outcome: Progressing   Problem: Activity: Goal: Risk for activity intolerance will decrease 05/23/2020 0758 by Shanon Ace, RN Outcome: Progressing 05/22/2020 1819 by Shanon Ace, RN Outcome: Progressing   Problem: Cardiac: Goal: Will achieve and/or maintain hemodynamic stability 05/23/2020 0758 by Shanon Ace, RN Outcome: Progressing 05/22/2020 1819 by  Shanon Ace, RN Outcome: Progressing   Problem: Clinical Measurements: Goal: Postoperative complications will be avoided or minimized 05/23/2020 0758 by Shanon Ace, RN Outcome: Progressing 05/22/2020 1819 by Shanon Ace, RN Outcome: Progressing   Problem: Respiratory: Goal: Respiratory status will improve 05/23/2020 0758 by Shanon Ace, RN Outcome: Progressing 05/22/2020 1819 by Shanon Ace, RN Outcome: Progressing   Problem: Pain Management:  Goal: Pain level will decrease 05/23/2020 0758 by Shanon Ace, RN Outcome: Progressing 05/22/2020 1819 by Shanon Ace, RN Outcome: Progressing   Problem: Pain Management: Goal: Pain level will decrease 05/23/2020 0758 by Shanon Ace, RN Outcome: Progressing 05/22/2020 1819 by Shanon Ace, RN Outcome: Progressing   Problem: Skin Integrity: Goal: Wound healing without signs and symptoms infection will improve 05/23/2020 0758 by Shanon Ace, RN Outcome: Progressing 05/22/2020 1819 by Shanon Ace, RN Outcome: Progressing

## 2020-05-23 NOTE — Progress Notes (Signed)
The proposed treatment discussed at cancer conference 11/4 is for discussion purpose only and not a binding recommendation.  The patient was not physically examined nor present for their treatment options.  Therefore, final treatment plans cannot be decided.

## 2020-05-24 ENCOUNTER — Inpatient Hospital Stay (HOSPITAL_COMMUNITY): Payer: Medicare Other

## 2020-05-24 LAB — COMPREHENSIVE METABOLIC PANEL
ALT: 18 U/L (ref 0–44)
AST: 18 U/L (ref 15–41)
Albumin: 4.1 g/dL (ref 3.5–5.0)
Alkaline Phosphatase: 37 U/L — ABNORMAL LOW (ref 38–126)
Anion gap: 10 (ref 5–15)
BUN: 20 mg/dL (ref 8–23)
CO2: 25 mmol/L (ref 22–32)
Calcium: 9.1 mg/dL (ref 8.9–10.3)
Chloride: 106 mmol/L (ref 98–111)
Creatinine, Ser: 1.15 mg/dL (ref 0.61–1.24)
GFR, Estimated: >60 mL/min (ref >60)
Glucose, Bld: 107 mg/dL — ABNORMAL HIGH (ref 70–99)
Potassium: 3.8 mmol/L (ref 3.5–5.1)
Sodium: 141 mmol/L (ref 135–145)
Total Bilirubin: 0.5 mg/dL (ref 0.3–1.2)
Total Protein: 6.4 g/dL — ABNORMAL LOW (ref 6.5–8.1)

## 2020-05-24 LAB — CBC
HCT: 33.3 % — ABNORMAL LOW (ref 39.0–52.0)
Hemoglobin: 11.1 g/dL — ABNORMAL LOW (ref 13.0–17.0)
MCH: 34 pg (ref 26.0–34.0)
MCHC: 33.3 g/dL (ref 30.0–36.0)
MCV: 102.1 fL — ABNORMAL HIGH (ref 80.0–100.0)
Platelets: 134 10*3/uL — ABNORMAL LOW (ref 150–400)
RBC: 3.26 MIL/uL — ABNORMAL LOW (ref 4.22–5.81)
RDW: 13.3 % (ref 11.5–15.5)
WBC: 8.3 10*3/uL (ref 4.0–10.5)
nRBC: 0 % (ref 0.0–0.2)

## 2020-05-24 LAB — SURGICAL PATHOLOGY

## 2020-05-24 IMAGING — DX DG CHEST 1V PORT
1 series · 1 of 1 positions shown · non-contrast
Comparison: Chest radiograph [DATE].

CLINICAL DATA: Status post lobectomy of lung.

EXAM:
PORTABLE CHEST 1 VIEW

[chest]
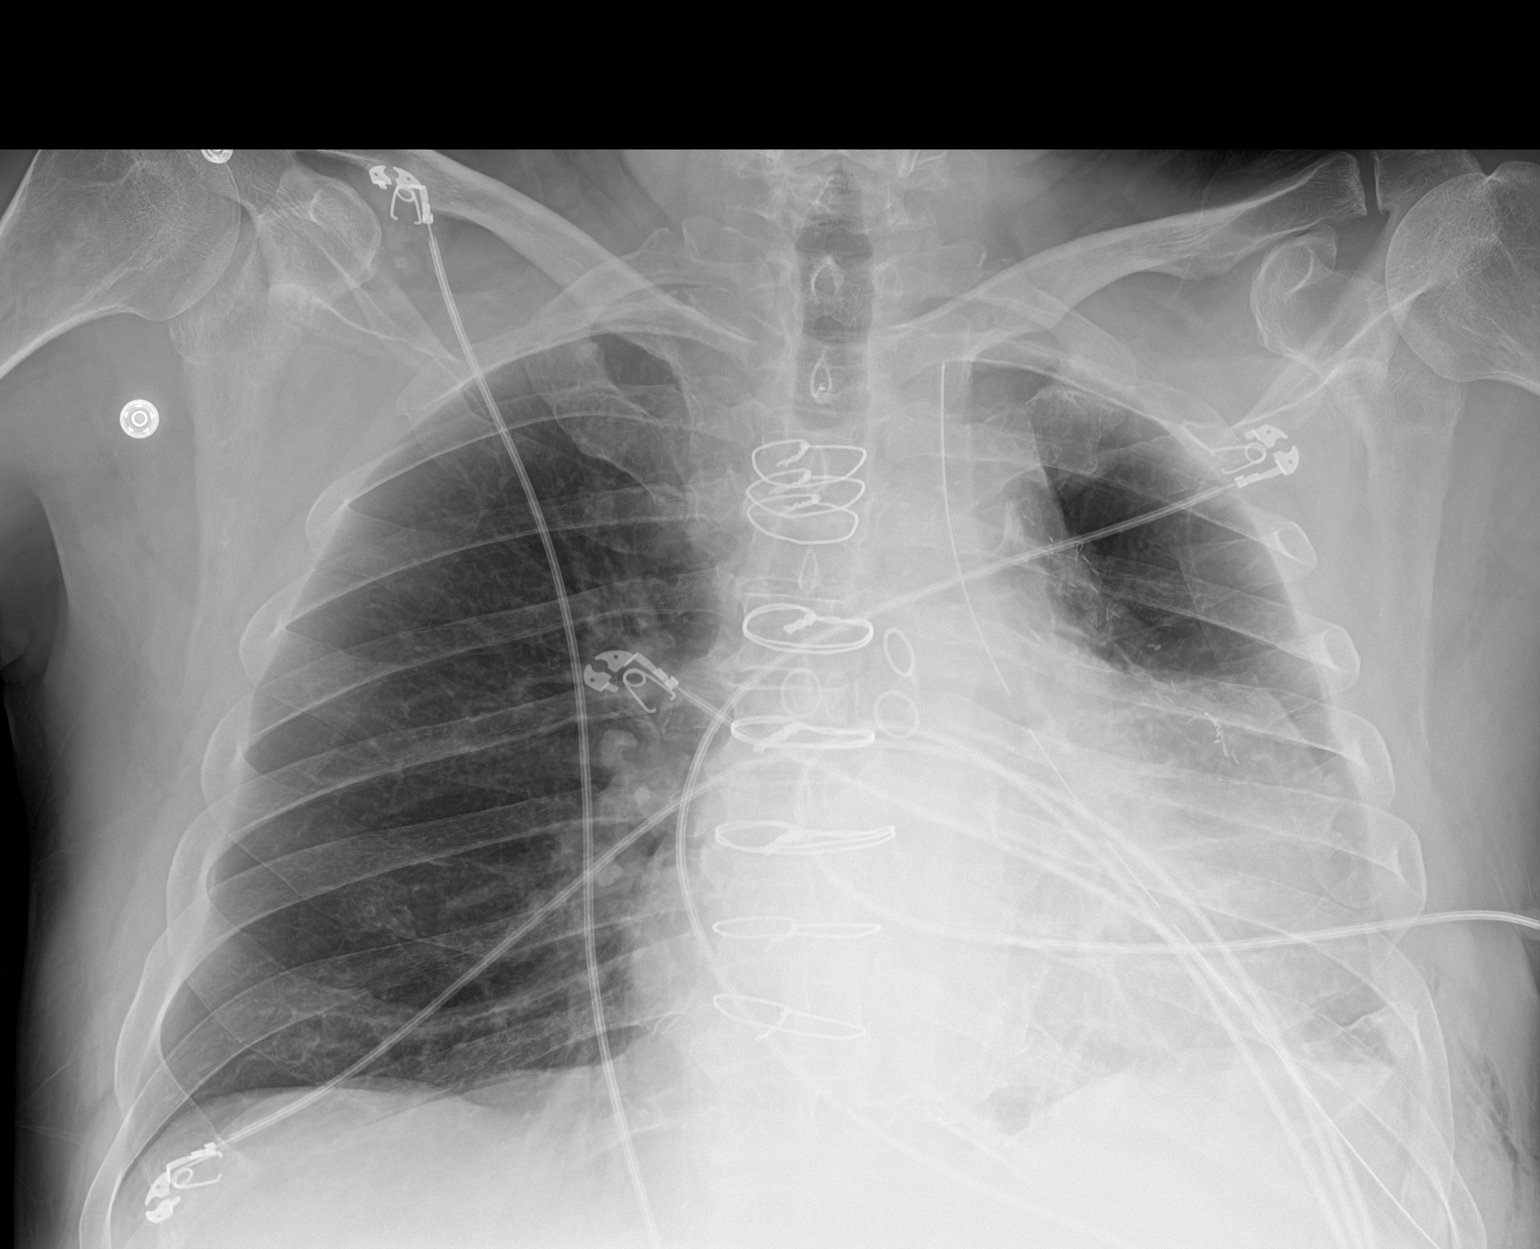

[1 of 1 positions shown; findings below may reference images not displayed]

FINDINGS: Prior median sternotomy/CABG. Unchanged cardiomegaly. Unchanged
position of a left chest tube terminating near the left lung apex.
No appreciable pneumothorax. No significant pleural effusion. As
before, there is mild left hemithorax volume loss with mild streaky
left lung base atelectasis. The right lung remains clear. No acute
bony abnormality. Persistent subtle subcutaneous emphysema along the
left chest wall.
IMPRESSION: No significant interval change as compared to the chest radiograph
of [DATE].

Left chest tube terminating near the left lung apex. No appreciable
pneumothorax.

Unchanged mild left hemithorax volume loss with mild streaky left
lung base atelectasis.

Unchanged cardiomegaly.

## 2020-05-24 IMAGING — DX DG CHEST 1V PORT
1 series · 2 of 2 positions shown · non-contrast
Comparison: Same day.

CLINICAL DATA: Pneumothorax.

EXAM:
PORTABLE CHEST 1 VIEW

[Series 1: chest · 0.14mm/px · 2 of 2 slices shown]
[im 1/2]
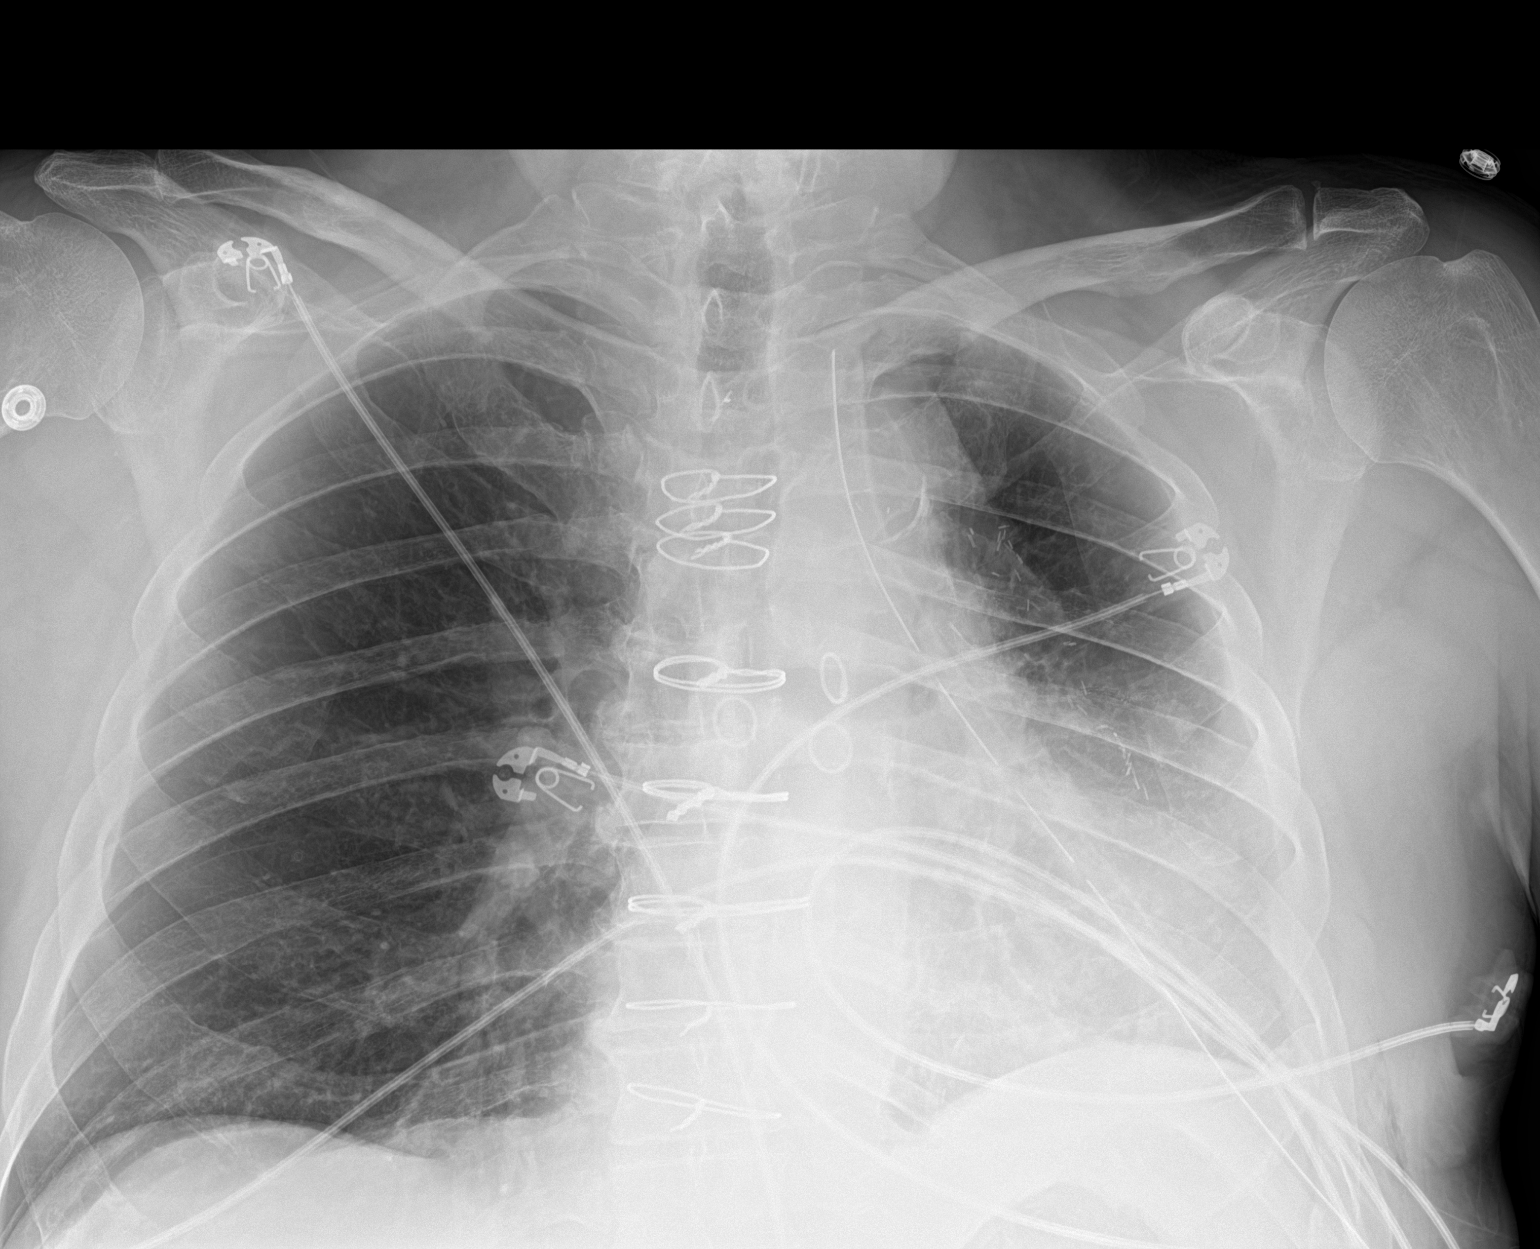
[im 2/2]
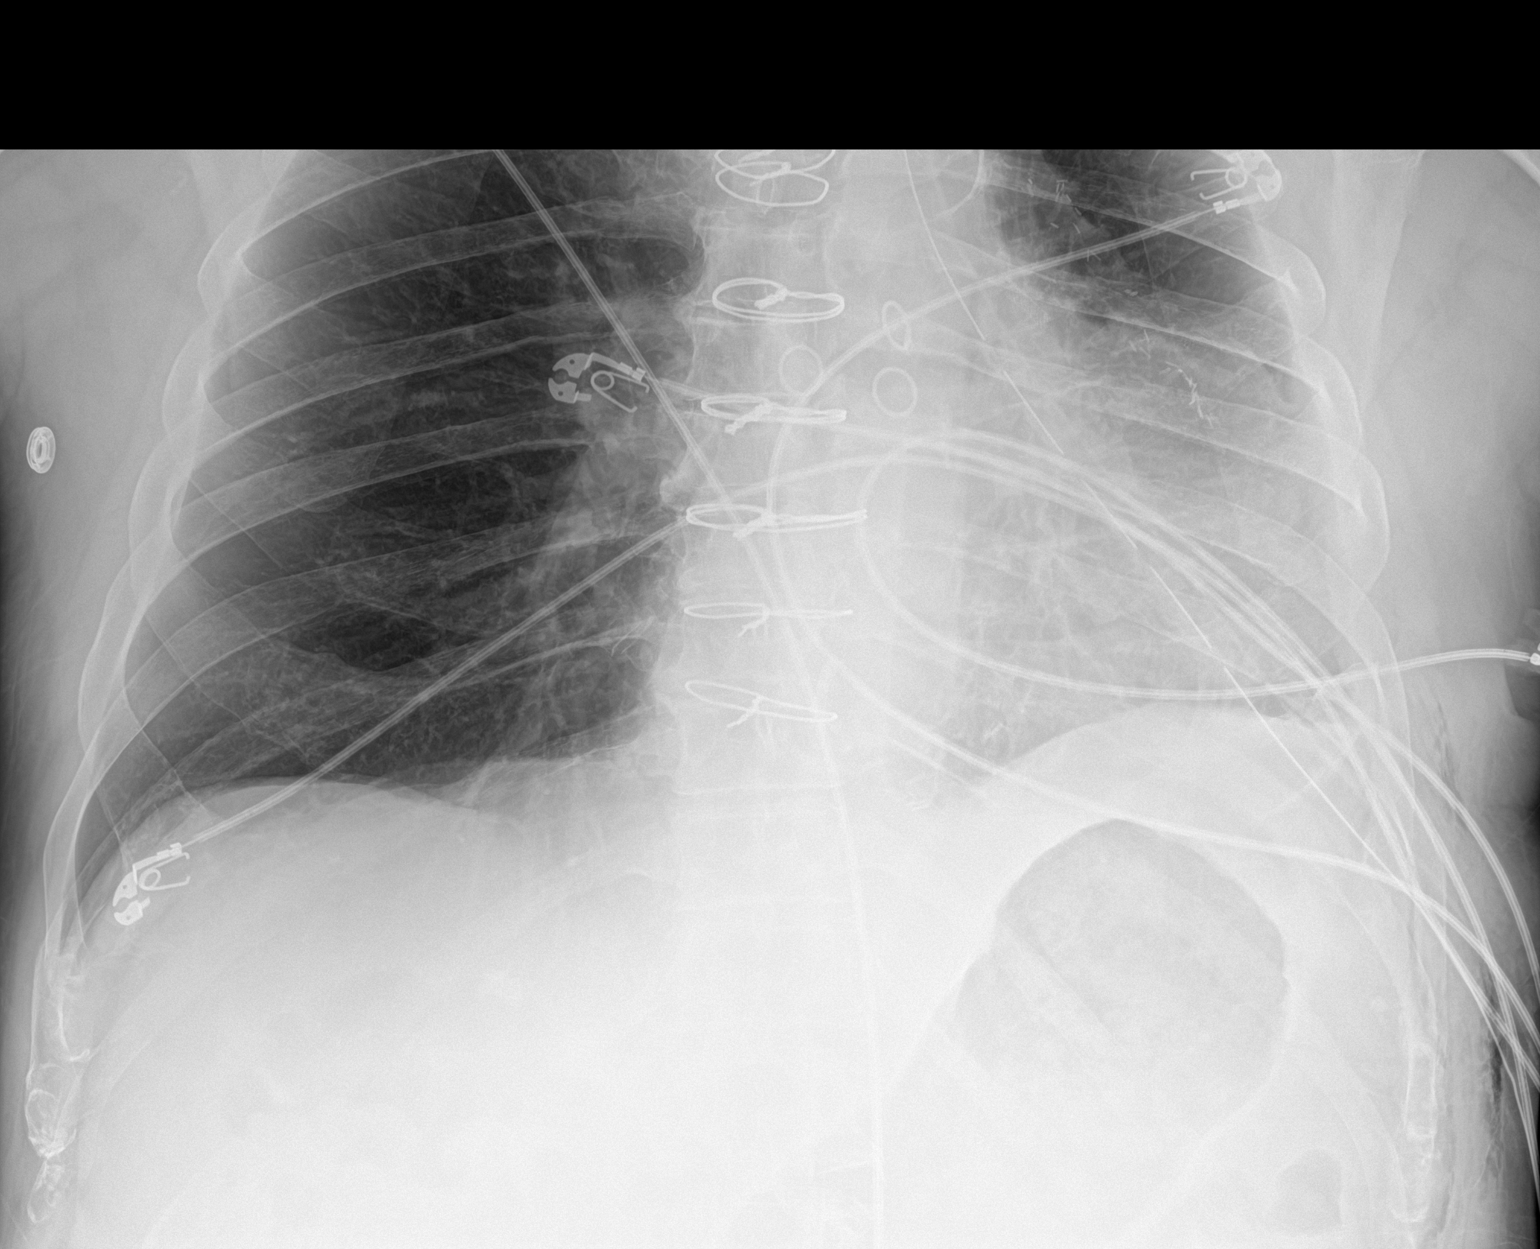

[2 of 2 positions shown; findings below may reference images not displayed]

FINDINGS: Stable cardiomegaly. Status post coronary bypass graft. Left-sided
chest tube is unchanged in position. No pneumothorax is noted. Right
lung is clear. Left basilar atelectasis is noted with probable small
pleural effusion. Bony thorax is unremarkable.
IMPRESSION: Stable left-sided chest tube without pneumothorax. Left basilar
atelectasis is noted with probable small pleural effusion.

## 2020-05-24 MED ORDER — TRAMADOL HCL 50 MG PO TABS: 50.0000 mg | ORAL_TABLET | Freq: Four times a day (QID) | ORAL | 0 refills | Status: AC | PRN

## 2020-05-24 NOTE — Discharge Instructions (Signed)
Video-Assisted Thoracic Surgery, Care After This sheet gives you information about how to care for yourself after your procedure. Your doctor may also give you more specific instructions. If you have problems or questions, contact your doctor. What can I expect after the procedure? After the procedure, it is common to have:  Some pain and soreness in your chest.  Pain when you breathe in (inhale) and cough.  Trouble pooping (constipation).  Tiredness (fatigue).  Trouble sleeping. Follow these instructions at home: Preventing lung infection (pneumonia)  Take deep breaths or do breathing exercises as told by your doctor.  Cough often. Coughing is important to clear thick spit (phlegm) and open your lungs.  You can make coughing hurt less if you try supporting (splinting) your chest. Try one of these when you cough: ? Hold a pillow against your chest. ? Place both hands flat on top of your cut.  Use an incentive spirometer as told by your doctor. This is a tool that measures how well you can fill your lungs with each breath.  Do lung therapy (pulmonary rehabilitation) as told. Medicines  Take over-the-counter or prescription medicines only as told by your doctor.  If you have pain, take pain-relieving medicine before your pain gets very bad. Doing this will help you breathe and cough more comfortably.  If you were prescribed an antibiotic medicine, take it as told by your doctor. Do not stop taking the antibiotic even if you start to feel better. Activity  Ask your doctor what activities are safe for you.  Avoid activities that use your chest muscles for 3-4 weeks or longer.  Do not lift anything that is heavier than 10 lb (4.5 kg), or the limit that your doctor tells you, until he or she says that it is safe. Cut (incision) care  Follow instructions from your doctor about how to take care of your cut(s) from surgery. Make sure you: ? Wash your hands with soap and water  before you change your bandage (dressing). If you cannot use soap and water, use hand sanitizer. ? Change your bandage as told by your doctor. ? Leave stitches (sutures), skin glue, or skin tape (adhesive) strips in place. They may need to stay in place for 2 weeks or longer. If tape strips get loose and curl up, you may trim the loose edges. Do not remove tape strips completely unless your doctor says it is okay.  Keep your bandage dry until it has been removed.  Every day, check the area around your cut(s) for signs of infection. Check for: ? Redness, swelling, or pain. ? Fluid or blood. ? Warmth. ? Pus or a bad smell. Bathing  Do not take baths, swim, or use a hot tub until your doctor approves. You may take showers.  After your bandage is removed, use soap and water to gently wash the your cut(s) from surgery. Do not use anything else to clean your cut(s) unless your doctor tells you to do this. Driving    Do not drive until your doctor approves.  Do not drive or use heavy machinery while taking prescription pain medicine. Eating and drinking  Eat a healthy diet as told by your doctor. A healthy diet includes: ? Lots of fresh fruits and vegetables. ? Whole grains. ? Low-fat (lean) proteins.  Limit foods that are high in fat and processed sugars. These include fried and sweet foods.  Drink enough fluid to keep your pee (urine) clear or light yellow. General instructions  To prevent or treat trouble pooping while you are taking prescription pain medicine, your doctor may recommend that you: ? Take over-the-counter or prescription medicines. ? Eat foods that have a lot of fiber. These include beans, fresh fruits and vegetables, and whole grains.  Do not use any products that contain nicotine or tobacco. These include cigarettes and e-cigarettes. If you need help quitting, ask your doctor.  Avoid being where people are smoking (avoid secondhand smoke).  Wear  compression stockings as told by your doctor. These stockings help you: ? Not get blood clots in your legs. ? Have less swelling in your legs.  If you have a chest tube, care for it as told by your doctor.  Do not travel by airplane during the 2 weeks after your chest tube is removed, or until your doctor says that this is safe.  Keep all follow-up visits as told by your doctor. This is important. Contact a doctor if:  You have redness, swelling, or pain around a cut from surgery.  You have fluid or blood coming from a cut from surgery.  Your cut(s) from surgery feel warm to the touch.  You have pus or a bad smell coming from a cut from surgery.  You have a fever or chills.  You feel sick to your stomach (nauseous).  You throw up (vomit).  You have pain that does not get better with medicine. Get help right away if:  You have chest pain.  Your heart is fluttering or beating fast.  You start to have a rash.  You have shortness of breath.  You have trouble breathing.  You are confused.  You have trouble talking or understanding.  You feel weak, light-headed, or dizzy.  You faint. Summary  To help prevent lung infection (pneumonia), take deep breaths or do breathing exercises as told by your doctor.  Cough often. This is important for clearing chest fluid (phlegm). You can make coughing hurt less if you hold a pillow to your chest or put your hands flat on the cut(s) when you cough (do splinting).  Do not drive until your doctor approves.  Every day, check your cut(s) for signs of infection. These signs can be redness, swelling, pain, fluid, blood, warmth, pus, or a bad smell.  Eat a healthy diet. This includes lots of fresh fruits and vegetables, whole grains, and low-fat (lean) proteins. This information is not intended to replace advice given to you by your health care provider. Make sure you discuss any questions you have with your health care  provider. Document Revised: 06/18/2017 Document Reviewed: 06/15/2016 Elsevier Patient Education  2020 [employer].

## 2020-05-24 NOTE — Progress Notes (Signed)
      [address]       [place] [address]             [phone]      2Day Post-Op Procedure(s) (LRB): XI ROBOTIC ASSISTED THORASCOPY-LEFT UPPER LOBECTOMY (Left) INTERCOSTAL NERVE BLOCK (Left) NODE DISSECTION (N/A)   Subjective:  Patient sitting up in bed.  No concerns, says he has no difficulty breathing, remains on RA. Pain controlled.   Objective: Vital signs in last 24 hours: Temp:  [97.5 F (36.4 C)-98.2 F (36.8 C)] 97.8 F (36.6 C) (11/05 0308) Pulse Rate:  [56-72] 63 (11/05 0308) Cardiac Rhythm: Normal sinus rhythm (11/05 0308) Resp:  [14-20] 18 (11/05 0308) BP: (94-110)/(54-72) 110/72 (11/05 0308) SpO2:  [94 %-98 %] 96 % (11/05 0308)  Intake/Output from previous day: 11/04 0701 - 11/05 0700 In: 480 [P.O.:480] Out: 300 [Chest Tube:300]  General appearance: alert, cooperative and no distress Heart: regular rate and rhythm Lungs: Breath sounds clear, moderate air leak with cough. CXR stable. No PTX. Abdomen: soft, non-tender; bowel sounds normal; no masses,  no organomegaly Extremities: extremities normal, atraumatic, no cyanosis or edema Wound: clean and dry  Lab Results: Recent Labs    05/23/20 0012 05/24/20 0012  WBC 8.6 8.3  HGB 11.4* 11.1*  HCT 34.3* 33.3*  PLT 144* 134*   BMET:  Recent Labs    05/23/20 0012 05/24/20 0012  NA 137 141  K 4.2 3.8  CL 101 106  CO2 26 25  GLUCOSE 163* 107*  BUN 16 20  CREATININE 1.26* 1.15  CALCIUM 8.7* 9.1    PT/INR:  Recent Labs    05/21/20 1400  LABPROT 13.0  INR 1.0   ABG    Component Value Date/Time   PHART 7.264 (L) 05/22/2020 1254   HCO3 22.9 05/22/2020 1254   TCO2 24 05/22/2020 1254   ACIDBASEDEF 5.0 (H) 05/22/2020 1254   O2SAT 98.0 05/22/2020 1254   CBG (last 3)  No results for input(s): GLUCAP in the last 72 hours.   EXAM: PORTABLE CHEST 1 VIEW  COMPARISON:  Chest radiograph 05/23/2020.  FINDINGS: Prior median sternotomy/CABG. Unchanged cardiomegaly.  Unchanged position of a left chest tube terminating near the left lung apex. No appreciable pneumothorax. No significant pleural effusion. As before, there is mild left hemithorax volume loss with mild streaky left lung base atelectasis. The right lung remains clear. No acute bony abnormality. Persistent subtle subcutaneous emphysema along the left chest wall.  IMPRESSION: No significant interval change as compared to the chest radiograph of 05/23/2020.  Left chest tube terminating near the left lung apex. No appreciable pneumothorax.  Unchanged mild left hemithorax volume loss with mild streaky left lung base atelectasis.  Unchanged cardiomegaly.   Electronically Signed   By: Kellie Simmering DO   On: 05/24/2020 07:55  Assessment/Plan: S/P Procedure(s) (LRB): XI ROBOTIC ASSISTED THORASCOPY-LEFT UPPER LOBECTOMY (Left) INTERCOSTAL NERVE BLOCK (Left) NODE DISSECTION (N/A)  1. CV- H/O CABG, in NSR and BP stable- on his usual Cardura, Altace, Aldactone, and Crestor 2. Pulm- CT on water seal with 1+ air leak, CXR stable, no PTX, leave chest tube on water seal today. CXR in AM 3. Renal- creatinine at 1.15, likely due to Toradol use, discontinued yesterday. 4. Expected post operative blood loss anemia, stable Hct.  5. Continue lovenox for DVT prophylaxis   LOS: 2 days    Antony Odea, PA-C 05/24/2020

## 2020-05-24 NOTE — Plan of Care (Signed)

## 2020-05-25 ENCOUNTER — Other Ambulatory Visit: Payer: Self-pay | Admitting: Cardiology

## 2020-05-30 ENCOUNTER — Encounter: Payer: Self-pay | Admitting: Thoracic Surgery (Cardiothoracic Vascular Surgery)

## 2020-05-30 ENCOUNTER — Other Ambulatory Visit: Payer: Self-pay

## 2020-05-30 ENCOUNTER — Ambulatory Visit (INDEPENDENT_AMBULATORY_CARE_PROVIDER_SITE_OTHER): Payer: Self-pay | Admitting: Thoracic Surgery (Cardiothoracic Vascular Surgery)

## 2020-05-30 ENCOUNTER — Ambulatory Visit: Payer: Medicare Other | Admitting: Thoracic Surgery (Cardiothoracic Vascular Surgery)

## 2020-05-30 VITALS — BP 119/71 | HR 73 | Resp 18 | Ht 71.0 in | Wt 202.0 lb

## 2020-05-30 DIAGNOSIS — Z902 Acquired absence of lung [part of]: Secondary | ICD-10-CM

## 2020-05-30 NOTE — Progress Notes (Signed)
[address]       [place] [address]             [phone]        John Hinton [facility] Medical Record #[id number] Date of Birth: [birth year]  Referring: [name], DO Primary Care: [name], MD Primary Cardiologist:Traci [name], MD  Reason for visit:   follow-up  History of Present Illness:     The patient comes in for his 1 week follow-up appointment.  He has noticed some bruising along his left chest wall.  He denies any respiratory symptoms.  Physical Exam: BP 119/71 (BP Location: Right Arm, Patient Position: Sitting)   Pulse 73   Resp 18   Ht 5\' 11"  (1.803 m)   Wt 202 lb (91.6 kg)   SpO2 97% Comment: RA with mask on  BMI 28.17 kg/m   Alert NAD Incision clean, stitch removed.  Bruising along the left chest wall extending down to his abdomen.   Abdomen soft, ND No peripheral edema   Diagnostic Studies & Laboratory data: Path:  FINAL MICROSCOPIC DIAGNOSIS:   A. LYMPH NODE, HILAR #1, EXCISION:  - One of one lymph nodes negative for carcinoma (0/1).   B. LYMPH NODE, HILAR #2, EXCISION:  - Benign fibroadipose tissue.  - No lymphoid tissue present.   C. LYMPH NODE, 9, EXCISION:  - Metastatic carcinoma in one of one lymph nodes (1/1).   D. LYMPH NODE, 9 #2, EXCISION:  - Metastatic carcinoma in one of one lymph nodes (1/1).   E. LYMPH NODE, HILAR #3, EXCISION:  - Metastatic carcinoma in one of one lymph nodes (1/1).   F. LYMPH NODE, HILAR #4, EXCISION:  - Metastatic carcinoma in one of one lymph nodes (1/1).   G. LUNG, LEFT UPPER LOBE, LOBECTOMY:  - Adenocarcinoma, moderately differentiated, acinar predominant,  spanning 5.7 cm.  - Tumor invades pleura.  - Multiple pleural tumor deposits.  - Resection margins are negative.  - See oncology table.   H. LYMPH NODE, HILAR #5, EXCISION:  - Metastatic carcinoma in one of one lymph nodes (1/1).   I. LYMPH NODE, HILAR #6, EXCISION:  - One of one lymph nodes negative  for carcinoma (0/1).  J. LYMPH NODE, HILAR #7, EXCISION:  - One of one lymph nodes negative for carcinoma (0/1).   K. LYMPH NODE, HILAR #8, EXCISION:  - Metastatic carcinoma in one of one lymph nodes (1/1).   ONCOLOGY TABLE:   LUNG: Resection   Procedure: Left upper lobe lobectomy with lymph node sampling  Specimen Laterality: Left  Tumor Site: Left upper lobe  Tumor Size: 5.7 cm  Tumor Focality: Unifocal, see comment  Histologic Type: Adenocarcinoma, acinar predominant  Visceral Pleura Invasion: Present  Lymphovascular Invasion: Present  Direct Invasion of Adjacent Structures: No adjacent structures present  Margins: Uninvolved by tumor  Treatment Effect: No know presurgical therapy  Regional Lymph Nodes:    Number of Lymph Nodes Involved: 6    Number of Lymph Nodes Examined: 9  Pathologic Stage Classification (pTNM, AJCC 8th Edition): pT3, pN2, pM1a     Assessment / Plan:   74 year old male status post a left upper lobectomy for a T3 N2 M0 adenocarcinoma of the left upper lobe.  On his pathology report he was noted to have an M1a and a lesion, and a question of this classification.  It is more likely that he has stage IIIb disease.  His case will need to be  discussed in our tumor board and that we will need to be more clarification of the M1a classification.  Additionally a portion of the lung can to be wedged off and left in situ due to its density attachment to the left internal thoracic artery, and the phrenic nerve.  These areas were far away from the original tumor and tumorlets.  I will arrange for him to meet with Dr. Julien Nordmann to discuss adjuvant therapy, and given the positive level 9 lymph node he may also require radiation therapy.  I will see him in follow-up in 1 month with a chest x-ray.   Lajuana Matte 05/30/2020 12:35 PM

## 2020-05-31 ENCOUNTER — Encounter: Payer: Self-pay | Admitting: *Deleted

## 2020-05-31 DIAGNOSIS — R918 Other nonspecific abnormal finding of lung field: Secondary | ICD-10-CM

## 2020-05-31 NOTE — Progress Notes (Signed)
I received notification from Dr. Kipp Brood that he referral on John Hinton to see Dr. Julien Nordmann.  I notified new patient to call and schedule on 06/17/20 with labs.

## 2020-06-04 ENCOUNTER — Telehealth: Payer: Self-pay | Admitting: Internal Medicine

## 2020-06-04 NOTE — Telephone Encounter (Signed)
Received a new pt referral from Dr. Kipp Brood for lung cancer. [name] has been cld and scheduled to see Dr. Julien Nordmann on 11/29 at 215pm w/labs at 145pm. Pt aware to arrive 15 minutes early.

## 2020-06-07 ENCOUNTER — Encounter: Payer: Self-pay | Admitting: *Deleted

## 2020-06-07 NOTE — Progress Notes (Signed)
Reached out to [name] to introduce myself as the office RN Navigator and explain our new patient process. Reviewed the reason for their referral and scheduled their new patient appointment along with labs. Provided address and directions to the office including call back phone number. Reviewed with patient any concerns they may have or any possible barriers to attending their appointment.   Informed patient about my role as a navigator and that I will meet with them prior to their New Patient appointment and more fully discuss what services I can provide. At this time patient has no further questions or needs.   Oncology Nurse Navigator Documentation  Oncology Nurse Navigator Flowsheets 06/07/2020  Abnormal Finding Date 04/16/2020  Confirmed Diagnosis Date 05/09/2020  Diagnosis Status Confirmed Diagnosis Complete  Planned Course of Treatment Surgery  Phase of Treatment Surgery  Surgery Actual Start Date: 05/22/2020  Navigator Follow Up Date: 06/19/2020  Navigator Follow Up Reason: New Patient Appointment  Navigator Location [facility]  Referral Date to RadOnc/MedOnc 06/07/2020  Navigator Encounter Type Introductory Phone Call  Patient Visit Type MedOnc  Treatment Phase Pre-Tx/Tx Discussion  Barriers/Navigation Needs Coordination of Care;Education  Education Other  Interventions Coordination of Care;Education;Psycho-Social Support  Acuity Level 2-Minimal Needs (1-2 Barriers Identified)  Coordination of Care Appts  Education Method Verbal  Time Spent with Patient 45

## 2020-06-10 ENCOUNTER — Other Ambulatory Visit: Payer: Medicare Other

## 2020-06-10 ENCOUNTER — Ambulatory Visit: Payer: Medicare Other | Admitting: Hematology & Oncology

## 2020-06-10 ENCOUNTER — Telehealth: Payer: Self-pay | Admitting: *Deleted

## 2020-06-10 NOTE — Telephone Encounter (Signed)
I called and spoke to update patient that his appt with Dr. Julien Nordmann is cancelled due to his appt with Dr. Marin Olp.

## 2020-06-11 ENCOUNTER — Encounter: Payer: Self-pay | Admitting: *Deleted

## 2020-06-11 NOTE — Progress Notes (Signed)
Per Dr Antonieta Pert request, oncotype MAP testing sent on: Specimen [phone]  John Hinton 05/22/20  Oncology Nurse Navigator Documentation  Oncology Nurse Navigator Flowsheets 06/11/2020  Abnormal Finding Date -  Confirmed Diagnosis Date -  Diagnosis Status -  Planned Course of Treatment -  Phase of Treatment -  Surgery Actual Start Date: -  Navigator Follow Up Date: 06/19/2020  Navigator Follow Up Reason: New Patient Appointment  Navigator Location [facility]  Referral Date to RadOnc/MedOnc -  Navigator Encounter Type Molecular Studies  Patient Visit Type MedOnc  Treatment Phase Pre-Tx/Tx Discussion  Barriers/Navigation Needs Coordination of Care;Education  Education -  Interventions Coordination of Care  Acuity Level 2-Minimal Needs (1-2 Barriers Identified)  Coordination of Care Other  Education Method -  Time Spent with Patient 45

## 2020-06-17 ENCOUNTER — Ambulatory Visit: Payer: Medicare Other | Admitting: Internal Medicine

## 2020-06-17 ENCOUNTER — Other Ambulatory Visit: Payer: Medicare Other

## 2020-06-19 ENCOUNTER — Other Ambulatory Visit: Payer: Self-pay | Admitting: Hematology & Oncology

## 2020-06-19 ENCOUNTER — Encounter: Payer: Self-pay | Admitting: Hematology & Oncology

## 2020-06-19 ENCOUNTER — Inpatient Hospital Stay: Payer: Medicare Other | Attending: Hematology & Oncology

## 2020-06-19 ENCOUNTER — Inpatient Hospital Stay (HOSPITAL_BASED_OUTPATIENT_CLINIC_OR_DEPARTMENT_OTHER): Payer: Medicare Other | Admitting: Hematology & Oncology

## 2020-06-19 ENCOUNTER — Other Ambulatory Visit: Payer: Self-pay

## 2020-06-19 ENCOUNTER — Encounter: Payer: Self-pay | Admitting: *Deleted

## 2020-06-19 VITALS — BP 119/72 | HR 71 | Temp 98.9°F | Resp 20 | Ht 71.0 in | Wt 201.0 lb

## 2020-06-19 DIAGNOSIS — Z902 Acquired absence of lung [part of]: Secondary | ICD-10-CM | POA: Insufficient documentation

## 2020-06-19 DIAGNOSIS — R918 Other nonspecific abnormal finding of lung field: Secondary | ICD-10-CM

## 2020-06-19 DIAGNOSIS — C3490 Malignant neoplasm of unspecified part of unspecified bronchus or lung: Secondary | ICD-10-CM

## 2020-06-19 DIAGNOSIS — C3492 Malignant neoplasm of unspecified part of left bronchus or lung: Secondary | ICD-10-CM

## 2020-06-19 DIAGNOSIS — Z79899 Other long term (current) drug therapy: Secondary | ICD-10-CM | POA: Insufficient documentation

## 2020-06-19 DIAGNOSIS — Z87891 Personal history of nicotine dependence: Secondary | ICD-10-CM

## 2020-06-19 DIAGNOSIS — Z801 Family history of malignant neoplasm of trachea, bronchus and lung: Secondary | ICD-10-CM

## 2020-06-19 DIAGNOSIS — C3412 Malignant neoplasm of upper lobe, left bronchus or lung: Secondary | ICD-10-CM

## 2020-06-19 DIAGNOSIS — Z7189 Other specified counseling: Secondary | ICD-10-CM

## 2020-06-19 DIAGNOSIS — Z5111 Encounter for antineoplastic chemotherapy: Secondary | ICD-10-CM | POA: Insufficient documentation

## 2020-06-19 HISTORY — DX: Other specified counseling: Z71.89

## 2020-06-19 HISTORY — DX: Malignant neoplasm of unspecified part of left bronchus or lung: C34.92

## 2020-06-19 LAB — CMP (CANCER CENTER ONLY)
ALT: 20 U/L (ref 0–44)
AST: 18 U/L (ref 15–41)
Albumin: 4.5 g/dL (ref 3.5–5.0)
Alkaline Phosphatase: 76 U/L (ref 38–126)
Anion gap: 9 (ref 5–15)
BUN: 18 mg/dL (ref 8–23)
CO2: 27 mmol/L (ref 22–32)
Calcium: 9.6 mg/dL (ref 8.9–10.3)
Chloride: 105 mmol/L (ref 98–111)
Creatinine: 0.92 mg/dL (ref 0.61–1.24)
GFR, Estimated: >60 mL/min (ref >60)
Glucose, Bld: 100 mg/dL — ABNORMAL HIGH (ref 70–99)
Potassium: 3.9 mmol/L (ref 3.5–5.1)
Sodium: 141 mmol/L (ref 135–145)
Total Bilirubin: 0.3 mg/dL (ref 0.3–1.2)
Total Protein: 7.4 g/dL (ref 6.5–8.1)

## 2020-06-19 LAB — CBC WITH DIFFERENTIAL (CANCER CENTER ONLY)
Abs Immature Granulocytes: 0.02 10*3/uL (ref 0.00–0.07)
Basophils Absolute: 0 10*3/uL (ref 0.0–0.1)
Basophils Relative: 0 %
Eosinophils Absolute: 0.1 10*3/uL (ref 0.0–0.5)
Eosinophils Relative: 1 %
HCT: 38.2 % — ABNORMAL LOW (ref 39.0–52.0)
Hemoglobin: 12.5 g/dL — ABNORMAL LOW (ref 13.0–17.0)
Immature Granulocytes: 0 %
Lymphocytes Relative: 14 %
Lymphs Abs: 0.9 10*3/uL (ref 0.7–4.0)
MCH: 32.6 pg (ref 26.0–34.0)
MCHC: 32.7 g/dL (ref 30.0–36.0)
MCV: 99.5 fL (ref 80.0–100.0)
Monocytes Absolute: 0.5 10*3/uL (ref 0.1–1.0)
Monocytes Relative: 8 %
Neutro Abs: 4.9 10*3/uL (ref 1.7–7.7)
Neutrophils Relative %: 77 %
Platelet Count: 166 10*3/uL (ref 150–400)
RBC: 3.84 MIL/uL — ABNORMAL LOW (ref 4.22–5.81)
RDW: 13.3 % (ref 11.5–15.5)
WBC Count: 6.4 10*3/uL (ref 4.0–10.5)
nRBC: 0 % (ref 0.0–0.2)

## 2020-06-19 NOTE — Progress Notes (Signed)
Referral MD  Reason for Referral: Stage IVA ([id number]) Adenocarcinoma of the LUL -- s/p resection on 05/22/2020 -- NO actionable mutation but KRAS (+)  Chief Complaint  Patient presents with  . New Patient (Initial Visit)    Lung Cancer.  : I had lung cancer taken out.  HPI: [name] is actually well-known to me.  He is a 74 year old white male.  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  He has been in great health.  He has been very active.  He has been playing golf.  He has some problems with some shortness of breath.  He had a little bit of a cough.  There is no hemoptysis.  Ultimately, he underwent a CT scan and was found to have a mass in the left upper lung.  This measured 2.4 x 2.5 x 3.1 cm.  There is no mediastinal or hilar adenopathy.  There is no pleural effusion.  He subsequently underwent a thoracoscopic procedure.  This was done by Dr. Kipp Brood.  This was done on May 22, 2020.  He had the tumor and lymph nodes resected.  The pathology report ([id number]) showed a adeno carcinoma.  It was moderately differentiated.  It measured 5.7 cm.  It invaded the pleura.  He had multiple pleural tumor deposits.  He had 6/9 lymph nodes that were positive.  All margins were negative.  As such, he had a stage IVA (T3N2M0) adenocarcinoma.  There was no actionable mutation.  It was positive for PD-L1.  It was also positive for KRAS (G12C)  This is truly an unusual adenocarcinoma in which this was all resected.  It is a little surprising that the PET scan did not pick up any tumor outside of the actual lung nodule.  His recovery has been great.  He has been very active.  He had a good Thanksgiving.  His appetite has been good.  He has not lost weight.  He has been a smoker.  Has not smoked for many years.  He has had no change in bowel or bladder habits.  There is history of lung cancer in the family.  He has had no leg  swelling.  He has had no rashes.  He has had no headache.  Overall, I was his performance status is ECOG 0.    Past Medical History:  Diagnosis Date  . Arrhythmia    Post-op  . BPH (benign prostatic hypertrophy)   . Bradycardia    a. H/o asymptomatic bradycardia.  . Coronary artery disease    a. s/p CABG ~2007 with LIMA to LAD, SVG seq to OM1 and OM2, SVG to PDA and SVG to diag. b. Abnormal nuc 03/2015 - s/p DES to SVG-OM1-OM2. 01/29/17 POBA with cutting balloon to SVG-->OM  . DCM (dilated cardiomyopathy) ([place]) 09/17/2015  . Dyslipidemia   . Dyspnea    r/t lung mass  . ED (erectile dysfunction)   . Fear of heights   . Goals of care, counseling/discussion 06/19/2020  . Hypertension   . Ischemic cardiomyopathy    a. EF 46% by nuc 03/2015.  . Lung cancer ([place])   . Non-small cell carcinoma of lung, stage 4, left ([place]) 06/19/2020  . OSA (obstructive sleep apnea) 07/12/2015   Moderate OSA with AHI 16.5/hr now on CPAP at 8cm H2O, uses cpap nightly     . RBBB   . Shoulder, capsulitis, adhesive    Left Shoulder  :  Past Surgical History:  Procedure Laterality Date  . APPENDECTOMY    . BRONCHIAL BIOPSY  05/09/2020   Procedure: BRONCHIAL BIOPSIES;  Surgeon: [name], DO;  Location: [place] [facility];  Service: Pulmonary;;  . BRONCHIAL BRUSHINGS  05/09/2020   Procedure: BRONCHIAL BRUSHINGS;  Surgeon: [name], DO;  Location: [place] [facility];  Service: Pulmonary;;  . BRONCHIAL NEEDLE ASPIRATION BIOPSY  05/09/2020   Procedure: BRONCHIAL NEEDLE ASPIRATION BIOPSIES;  Surgeon: [name], DO;  Location: [place] [facility];  Service: Pulmonary;;  . BRONCHIAL WASHINGS  05/09/2020   Procedure: BRONCHIAL WASHINGS;  Surgeon: [name], DO;  Location: [place];  Service: Pulmonary;;  . CARDIAC CATHETERIZATION N/A 04/05/2015   Procedure: Left Heart Cath and Coronary Angiography;  Surgeon: Sherren Mocha, MD;  Location: [place] [facility];  Service: Cardiovascular;  Laterality: N/A;   . COLONOSCOPY    . CORONARY ARTERY BYPASS GRAFT     w/LIMA to LAD, seq SVG to OM1 and OM2, SVG to PDA and SVG to Diagonal  . CORONARY BALLOON ANGIOPLASTY N/A 01/29/2017   Procedure: Coronary Balloon Angioplasty;  Surgeon: Martinique, Giuliano Preece M, MD;  Location: [place] [facility];  Service: Cardiovascular;  Laterality: N/A;  . FIDUCIAL MARKER PLACEMENT  05/09/2020   Procedure: FIDUCIAL MARKER PLACEMENT;  Surgeon: [name], DO;  Location: [place];  Service: Pulmonary;;  . INTERCOSTAL NERVE BLOCK Left 05/22/2020   Procedure: INTERCOSTAL NERVE BLOCK;  Surgeon: Lajuana Matte, MD;  Location: [place] [facility];  Service: Thoracic;  Laterality: Left;  . LEFT HEART CATH AND CORS/GRAFTS ANGIOGRAPHY N/A 01/29/2017   Procedure: Left Heart Cath and Cors/Grafts Angiography;  Surgeon: Martinique, Lisa Blakeman M, MD;  Location: [place] [facility];  Service: Cardiovascular;  Laterality: N/A;  . NODE DISSECTION N/A 05/22/2020   Procedure: NODE DISSECTION;  Surgeon: Lajuana Matte, MD;  Location: [place];  Service: Thoracic;  Laterality: N/A;  . VIDEO BRONCHOSCOPY WITH ENDOBRONCHIAL NAVIGATION N/A 05/09/2020   Procedure: VIDEO BRONCHOSCOPY WITH ENDOBRONCHIAL NAVIGATION;  Surgeon: [name], DO;  Location: [place];  Service: Pulmonary;  Laterality: N/A;  . VIDEO BRONCHOSCOPY WITH ENDOBRONCHIAL ULTRASOUND N/A 05/09/2020   Procedure: VIDEO BRONCHOSCOPY WITH ENDOBRONCHIAL ULTRASOUND;  Surgeon: [name], DO;  Location: [place];  Service: Pulmonary;  Laterality: N/A;  :   Current Outpatient Medications:  .  aspirin 81 MG tablet, Take 81 mg by mouth at bedtime. , Disp: , Rfl:  .  cholecalciferol (VITAMIN D) 1000 UNITS tablet, Take 1,000 Units by mouth at bedtime. , Disp: , Rfl:  .  clopidogrel (PLAVIX) 75 MG tablet, TAKE ONE TABLET BY MOUTH EVERY MORNING WITH BREAKFAST (Patient taking differently: Take 75 mg by mouth daily. ), Disp: 90 tablet, Rfl: 0 .  Cyanocobalamin (B-12 PO), Take 1,000 mcg by  mouth daily. , Disp: , Rfl:  .  doxazosin (CARDURA) 2 MG tablet, Take 1 tablet (2 mg total) by mouth daily. (Patient taking differently: Take 2 mg by mouth at bedtime. ), Disp: 90 tablet, Rfl: 2 .  ezetimibe (ZETIA) 10 MG tablet, TAKE ONE TABLET BY MOUTH EVERY NIGHT AT BEDTIME (Patient taking differently: Take 10 mg by mouth daily. ), Disp: 90 tablet, Rfl: 0 .  folic acid (FOLVITE) 824 MCG tablet, Take 400 mcg by mouth daily., Disp: , Rfl:  .  Multiple Vitamin (MULTIVITAMIN) tablet, Take 1 tablet by mouth daily., Disp: , Rfl:  .  ramipril (ALTACE) 10 MG capsule, TAKE TWO CAPSULES BY MOUTH DAILY (Patient taking differently: Take  10 mg by mouth daily. ), Disp: 180 capsule, Rfl: 0 .  rosuvastatin (CRESTOR) 40 MG tablet, TAKE ONE TABLET BY MOUTH EVERY NIGHT AT BEDTIME, Disp: 90 tablet, Rfl: 2 .  sertraline (ZOLOFT) 50 MG tablet, Take 50 mg by mouth in the morning and at bedtime. , Disp: , Rfl:  .  spironolactone (ALDACTONE) 25 MG tablet, TAKE ONE TABLET BY MOUTH DAILY (Patient taking differently: Take 25 mg by mouth daily. ), Disp: 90 tablet, Rfl: 0 .  nitroGLYCERIN (NITROSTAT) 0.4 MG SL tablet, Place 1 tablet (0.4 mg total) under the tongue every 5 (five) minutes as needed for chest pain. (Patient not taking: Reported on 06/19/2020), Disp: 25 tablet, Rfl: 3:  :  Allergies  Allergen Reactions  . Amlodipine Swelling    Edema  :  Family History  Problem Relation Age of Onset  . Lung cancer Mother   . Lung cancer Father   . Heart disease Father   . Heart failure Father   . Cancer Sister   :  Social History   Socioeconomic History  . Marital status: Married    Spouse name: Not on file  . Number of children: Not on file  . Years of education: Not on file  . Highest education level: Not on file  Occupational History  . Not on file  Tobacco Use  . Smoking status: Former Smoker    Types: Cigarettes    Quit date: 07/21/1983    Years since quitting: 36.9  . Smokeless tobacco: Former Engineer, maintenance (IT)  . Vaping Use: Never used  Substance and Sexual Activity  . Alcohol use: Yes    Alcohol/week: 20.0 - 28.0 standard drinks    Types: 20 - 28 Standard drinks or equivalent per week    Comment: daily - 20-28 /week  . Drug use: No  . Sexual activity: Not Currently  Other Topics Concern  . Not on file  Social History Narrative  . Not on file   Social Determinants of Health   Financial Resource Strain:   . Difficulty of Paying Living Expenses: Not on file  Food Insecurity:   . Worried About Charity fundraiser in the Last Year: Not on file  . Ran Out of Food in the Last Year: Not on file  Transportation Needs:   . Lack of Transportation (Medical): Not on file  . Lack of Transportation (Non-Medical): Not on file  Physical Activity:   . Days of Exercise per Week: Not on file  . Minutes of Exercise per Session: Not on file  Stress:   . Feeling of Stress : Not on file  Social Connections:   . Frequency of Communication with Friends and Family: Not on file  . Frequency of Social Gatherings with Friends and Family: Not on file  . Attends Religious Services: Not on file  . Active Member of Clubs or Organizations: Not on file  . Attends Archivist Meetings: Not on file  . Marital Status: Not on file  Intimate Partner Violence:   . Fear of Current or Ex-Partner: Not on file  . Emotionally Abused: Not on file  . Physically Abused: Not on file  . Sexually Abused: Not on file  : Review of Systems  Constitutional: Negative.   HENT: Negative.   Eyes: Negative.   Respiratory: Negative.   Cardiovascular: Negative.   Gastrointestinal: Negative.   Genitourinary: Negative.   Musculoskeletal: Negative.   Skin: Negative.   Neurological: Negative.  Endo/Heme/Allergies: Negative.   Psychiatric/Behavioral: Negative.       Exam:  This is a well-developed and well-nourished white male in no obvious distress.  Vital signs show temperature of 98.9.  Pulse 71.  Blood  pressure 119/72.  Weight is 201 pounds.  Head and neck exam shows no ocular or oral lesions.  He has no adenopathy in the neck.  His thyroid is nonpalpable.  Lungs are clear bilaterally.  He has good air movement in both lung fields.  No wheezes are noted.  No friction rub is noted.  Cardiac exam regular rate and rhythm with no murmurs, rubs or bruits.  Abdomen is soft.  He has good bowel sounds.  There is no fluid wave.  There is no palpable liver or spleen tip.  Back exam shows no tenderness over the spine, ribs or hips.  He has thoracoscopy scars over on the left side.  These are healing.  Extremities shows no clubbing, cyanosis or edema.  Has good range of motion of his joints.  Neurological exam is nonfocal.  Skin exam shows no rashes, ecchymoses or petechia.   '@IPVITALS' @   Recent Labs    06/19/20 1514  WBC 6.4  HGB 12.5*  HCT 38.2*  PLT 166   Recent Labs    06/19/20 1514  NA 141  K 3.9  CL 105  CO2 27  GLUCOSE 100*  BUN 18  CREATININE 0.92  CALCIUM 9.6    Blood smear review: None  Pathology: See above    Assessment and Plan: [name] is a very nice 74 year old white male.  He has a most unusual stage IV lung cancer.  This was all pretty much confined to the left upper lung.  He was stage IV by virtue of multiple pleural deposits.  I do think that he is a candidate for adjuvant chemotherapy.  I would also incorporate adjuvant immunotherapy.  He does need to have an MRI of the brain to complete our staging.  I think that he would be a good candidate for treatment with carboplatinum/pemetrexed/pembrolizumab.  I probably would use 6 cycles of both and then probably go with maintenance pembrolizumab or possibly Alimta with pembrolizumab.  Again I know this is quite unusual.  However, given the fact that his disease was resected, all he has now would be considered microscopic.  I have to believe that he does have microscopic disease still left behind.  I talked to he  and his wife for over an hour.  I went over the pathology report.  I went over my recommendations and why he is stage IV.  However, I told them that this is a unusual stage IV lung cancer that potentially could be cured with surgery and treatment with chemotherapy.  I only worry about the fact that his tumor does contain the K-RAS mutation.  This certainly could make the cancer a little more resilient to therapy.  Again, since he only has microscopic disease, I would like to believe that the chemotherapy will be able to overcome any inherent resistance caused by the K-RAS mutation.  We, thankfully, do not need a Port-A-Cath.  He has pretty good peripheral access.  We will get treatment started next week.  Again our plan for 6 cycles of chemoimmunotherapy.  After that, I will then plan for a follow-up PET scan and then maintenance immunotherapy.

## 2020-06-19 NOTE — Progress Notes (Signed)
Initial RN Navigator Patient Visit  Name: John Hinton Date of Referral : 06/07/20 Diagnosis: Adenocarcinoma of the lung  Met with patient prior to their visit with MD. [name] patient "Your Patient Navigator" handout which explains my role, areas in which I am able to help, and all the contact information for myself and the office. Also gave patient MD and Navigator business card. Reviewed with patient the general overview of expected course after initial diagnosis and time frame for all steps to be completed.  New patient packet given to patient which includes: orientation to office and staff; campus directory; education on My Chart and Advance Directives; and patient centered education on lung cancer.   Patient completed visit with Dr. Marin Olp  Will review MD appointment tomorrow once Dr Marin Olp completes his note and follow up on any needs the patient has.   Patient understands all follow up procedures and expectations. They have my number to reach out for any further clarification or additional needs.   Oncology Nurse Navigator Documentation  Oncology Nurse Navigator Flowsheets 06/19/2020  Abnormal Finding Date -  Confirmed Diagnosis Date -  Diagnosis Status -  Planned Course of Treatment -  Phase of Treatment -  Surgery Actual Start Date: -  Navigator Follow Up Date: 06/20/2020  Navigator Follow Up Reason: Appointment Review  Navigator Location [facility]  Referral Date to RadOnc/MedOnc -  Navigator Encounter Type Initial MedOnc  Patient Visit Type MedOnc  Treatment Phase Pre-Tx/Tx Discussion  Barriers/Navigation Needs Coordination of Care;Education  Education Newly Diagnosed Cancer Education  Interventions Coordination of Care;Education;Psycho-Social Support  Acuity Level 2-Minimal Needs (1-2 Barriers Identified)  Coordination of Care Other  Education Method Verbal;Written  Support Groups/Services Friends and Family  Time Spent with Patient 30

## 2020-06-19 NOTE — Progress Notes (Signed)
START ON PATHWAY REGIMEN - Non-Small Cell Lung     A cycle is every 21 days:     Pembrolizumab      Pemetrexed      Carboplatin   **Always confirm dose/schedule in your pharmacy ordering system**  Patient Characteristics: Stage IV Metastatic, Nonsquamous, Initial Chemotherapy/Immunotherapy, PS = 0, 1, ALK Rearrangement Negative and ROS1 Rearrangement Negative and NTRK Gene Fusion?Negative and RET Gene Fusion?Negative and EGFR Mutation Negative, PD-L1 Expression Positive  1-49% (TPS) / Negative / Not Tested / Awaiting Test Results and Immunotherapy Candidate Therapeutic Status: Stage IV Metastatic Histology: Nonsquamous Cell ROS1 Rearrangement Status: Negative Other Mutations/Biomarkers: No Other Actionable Mutations Chemotherapy/Immunotherapy LOT: Initial Chemotherapy/Immunotherapy Molecular Targeted Therapy: Not Appropriate KRAS G12C Mutation Status: G12C Positive MET Exon 14 Mutation Status: Negative RET Gene Fusion Status: Negative EGFR Mutation Status: Negative/Wild Type NTRK Gene Fusion Status: Negative PD-L1 Expression Status: PD-L1 Positive 1-49% (TPS) ALK Rearrangement Status: Negative BRAF V600E Mutation Status: Negative ECOG Performance Status: 0 Biomarker Assessment Status Confirmation: All Genomic Markers Negative, or Only MET+ or BRAF+ or KRAS G12C+ Immunotherapy Candidate Status: Candidate for Immunotherapy Intent of Therapy: Curative Intent, Discussed with Patient

## 2020-06-20 ENCOUNTER — Encounter (HOSPITAL_COMMUNITY): Payer: Self-pay | Admitting: Hematology & Oncology

## 2020-06-20 ENCOUNTER — Encounter: Payer: Self-pay | Admitting: *Deleted

## 2020-06-20 LAB — LACTATE DEHYDROGENASE: LDH: 124 U/L (ref 98–192)

## 2020-06-20 LAB — CEA (IN HOUSE-CHCC): CEA (CHCC-In House): 18.24 ng/mL — ABNORMAL HIGH (ref 0.00–5.00)

## 2020-06-20 NOTE — Progress Notes (Signed)
Reviewed new patient appointment. Patient will need the following  MRI, no PA required - scheduled 06/22/20 Chemo Education scheduled for 06/25/2020 Chemo Initiation scheduled for 06/27/2020 Follow up appointments scheduled for 07/18/20  Called patient and reviewed all appointments with him, including date, time and location. He knows to call the office if he has any questions or concerns.   Oncology Nurse Navigator Documentation  Oncology Nurse Navigator Flowsheets 06/20/2020  Abnormal Finding Date -  Confirmed Diagnosis Date -  Diagnosis Status -  Planned Course of Treatment Chemotherapy  Phase of Treatment Chemo  Surgery Actual Start Date: -  Navigator Follow Up Date: 06/24/2020  Navigator Follow Up Reason: Scan Review  Navigator Location [facility]  Referral Date to RadOnc/MedOnc -  Navigator Encounter Type Appt/Treatment Plan Review;Telephone  Telephone Outgoing Call;Appt Confirmation/Clarification  Patient Visit Type MedOnc  Treatment Phase Pre-Tx/Tx Discussion  Barriers/Navigation Needs Coordination of Care;Education  Education Other  Interventions Coordination of Care;Education;Psycho-Social Support  Acuity Level 2-Minimal Needs (1-2 Barriers Identified)  Coordination of Care Appts  Education Method Verbal;Teach-back  Support Groups/Services Friends and Family  Time Spent with Patient 65

## 2020-06-20 NOTE — Progress Notes (Signed)
Pharmacist Chemotherapy Monitoring - Initial Assessment    Anticipated start date: 06/27/20  Regimen:  . Are orders appropriate based on the patient's diagnosis, regimen, and cycle? Yes . Does the plan date match the patient's scheduled date? Yes . Is the sequencing of drugs appropriate? Yes . Are the premedications appropriate for the patient's regimen? Yes . Prior Authorization for treatment is: Approved o If applicable, is the correct biosimilar selected based on the patient's insurance? not applicable  Organ Function and Labs: Marland Kitchen Are dose adjustments needed based on the patient's renal function, hepatic function, or hematologic function? No . Are appropriate labs ordered prior to the start of patient's treatment? Yes . Other organ system assessment, if indicated: N/A . The following baseline labs, if indicated, have been ordered: pembrolizumab: baseline TSH +/- T4  Dose Assessment: . Are the drug doses appropriate? Yes . Are the following correct: o Drug concentrations Yes o IV fluid compatible with drug Yes o Administration routes Yes o Timing of therapy Yes . If applicable, does the patient have documented access for treatment and/or plans for port-a-cath placement? yes . If applicable, have lifetime cumulative doses been properly documented and assessed? not applicable Lifetime Dose Tracking  No doses have been documented on this patient for the following tracked chemicals: Doxorubicin, Epirubicin, Idarubicin, Daunorubicin, Mitoxantrone, Bleomycin, Oxaliplatin, Carboplatin, Liposomal Doxorubicin  o   Toxicity Monitoring/Prevention: . The patient has the following take home antiemetics prescribed: Ondansetron, Prochlorperazine, Dexamethasone and Lorazepam . The patient has the following take home medications prescribed: B12 for pemetrexed and folic acid for pemetrexed . Medication allergies and previous infusion related reactions, if applicable, have been reviewed and addressed.  Yes . The patient's current medication list has been assessed for drug-drug interactions with their chemotherapy regimen. no significant drug-drug interactions were identified on review.  Order Review: . Are the treatment plan orders signed? Yes . Is the patient scheduled to see a provider prior to their treatment? No  I verify that I have reviewed each item in the above checklist and answered each question accordingly.  Rhonda Linan, Jacqlyn Larsen 06/20/2020 4:34 PM

## 2020-06-22 ENCOUNTER — Ambulatory Visit (HOSPITAL_BASED_OUTPATIENT_CLINIC_OR_DEPARTMENT_OTHER)
Admission: RE | Admit: 2020-06-22 | Discharge: 2020-06-22 | Disposition: A | Discharge status: Home / Self Care | Payer: Medicare Other | Source: Ambulatory Visit | Attending: Hematology & Oncology | Admitting: Hematology & Oncology

## 2020-06-22 ENCOUNTER — Other Ambulatory Visit: Payer: Self-pay

## 2020-06-22 DIAGNOSIS — C3490 Malignant neoplasm of unspecified part of unspecified bronchus or lung: Secondary | ICD-10-CM | POA: Diagnosis not present

## 2020-06-22 DIAGNOSIS — J341 Cyst and mucocele of nose and nasal sinus: Secondary | ICD-10-CM | POA: Diagnosis not present

## 2020-06-22 DIAGNOSIS — G319 Degenerative disease of nervous system, unspecified: Secondary | ICD-10-CM | POA: Diagnosis not present

## 2020-06-22 IMAGING — MR MR HEAD WO/W CM
12 of 14 series · 28 of 48 positions shown · IV contrast (gadavist)
Comparison: No pertinent prior exams available for comparison.

CLINICAL DATA: Malignant neoplasm of unspecified part of
unspecified bronchus or lung. Non-small cell lung cancer, staging;
new diagnosis of lung cancer.

EXAM:
MRI HEAD WITHOUT AND WITH CONTRAST
TECHNIQUE: Multiplanar, multiecho pulse sequences of the brain and surrounding
structures were obtained without and with intravenous contrast.
CONTRAST:  9mL GADAVIST GADOBUTROL 1 MMOL/ML IV SOLN

[Series 2: T1 · sagittal · 5.0mm · 0.47mm/px · 2 of 23 slices shown]
[im 1/23]
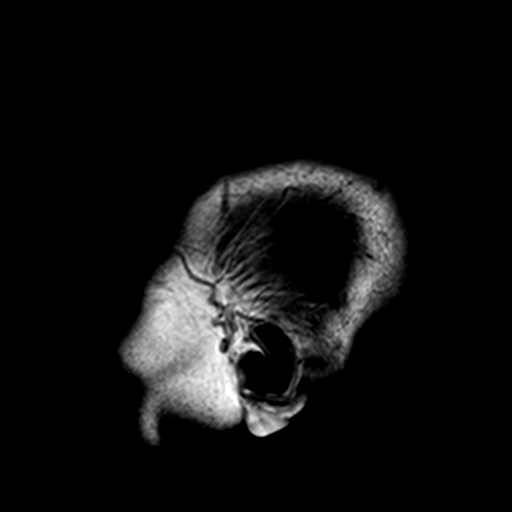
[im 23/23]
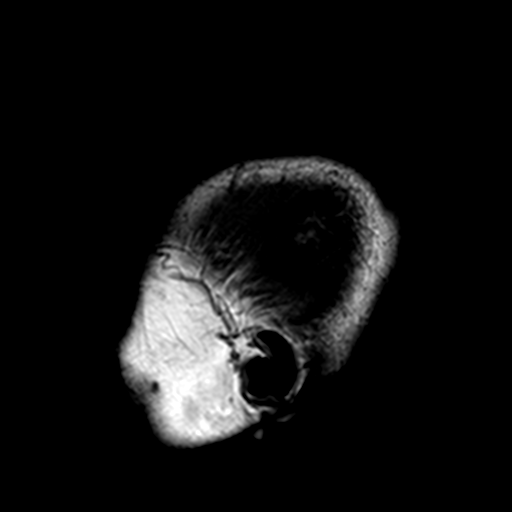

[Series 3: DWI · axial · 3.0mm · 2.19mm/px · z∈[-72,+87]mm · 6 of 105 slices shown (1 of 4)]
[im 1/105]
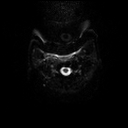
[im 21/105]
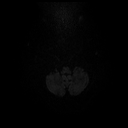
[im 42/105]
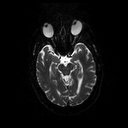
[im 63/105]
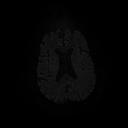
[im 84/105]
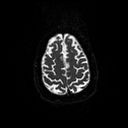
[im 105/105]
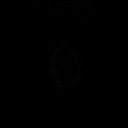

[Series 4: DWI · axial · 3.0mm · 2.19mm/px · z∈[-72,+87]mm · 3 of 54 slices shown (2 of 4)]
[im 1/54]
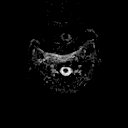
[im 27/54]
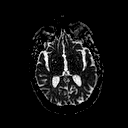
[im 54/54]
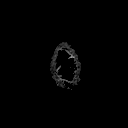

[Series 5: DWI · coronal · 5.0mm · 1.46mm/px · 5 of 80 slices shown (3 of 4)]
[im 1/80]
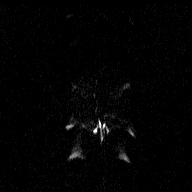
[im 20/80]
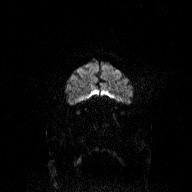
[im 40/80]
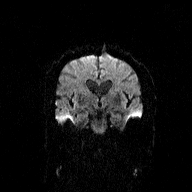
[im 60/80]
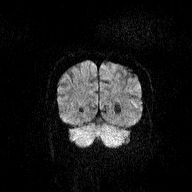
[im 80/80]
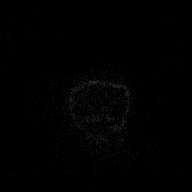

[Series 6: DWI · coronal · 5.0mm · 1.46mm/px · 2 of 40 slices shown (4 of 4)]
[im 1/40]
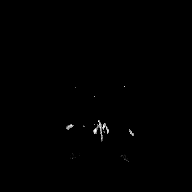
[im 40/40]
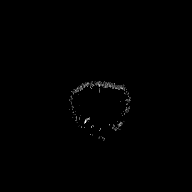

[Series 7: T2 · axial · 5.0mm · 0.60mm/px · 1 of 23 slices shown (1 of 2)]
[im 1/23]
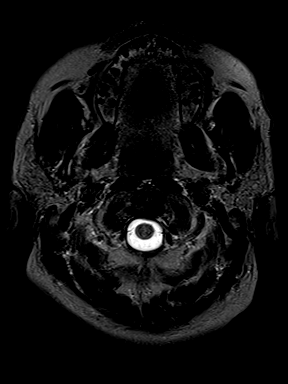

[Series 8: FLAIR · axial · 3.0mm · 0.90mm/px · z∈[-57,+98]mm · 2 of 40 slices shown]
[im 1/40]
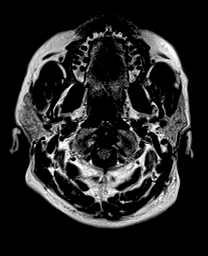
[im 40/40]
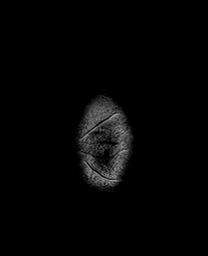

[Series 9: T2 · axial · 5.0mm · 0.45mm/px · 1 of 23 slices shown (2 of 2)]
[im 1/23]
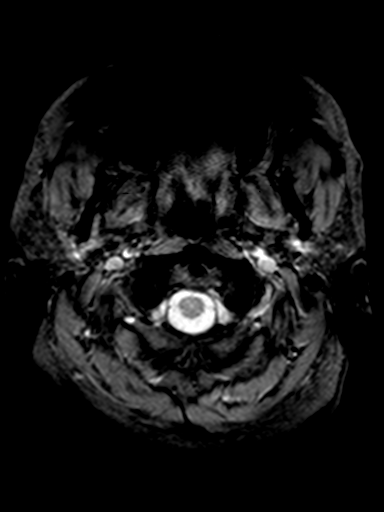

[Series 11: T2 post-contrast · coronal · 5.0mm · 0.45mm/px · 2 of 30 slices shown]
[im 1/30]
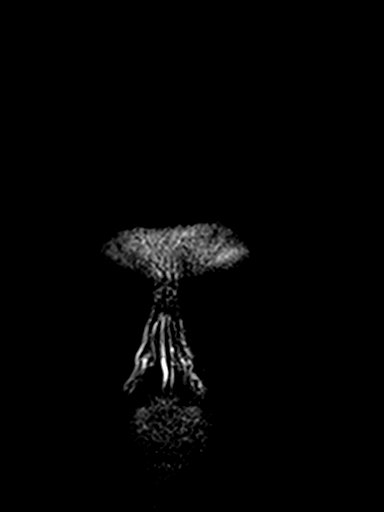
[im 30/30]
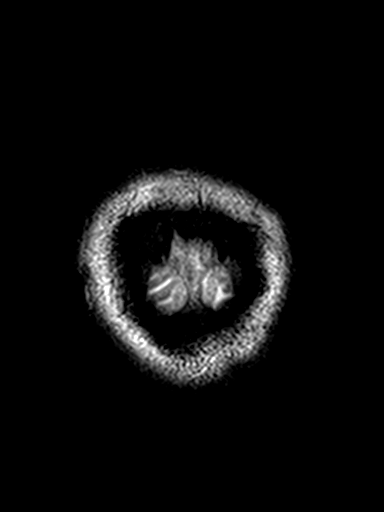

[Series 13: T1 post-contrast · coronal · 5.0mm · 0.72mm/px · 2 of 30 slices shown (1 of 2)]
[im 1/30]
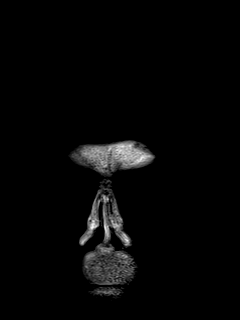
[im 30/30]
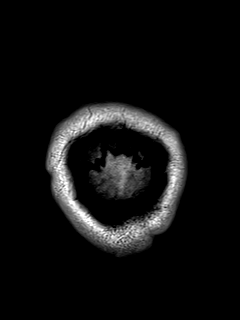

[Series 14: T1 post-contrast · sagittal · 5.0mm · 0.47mm/px · 1 of 23 slices shown (2 of 2)]
[im 1/23]
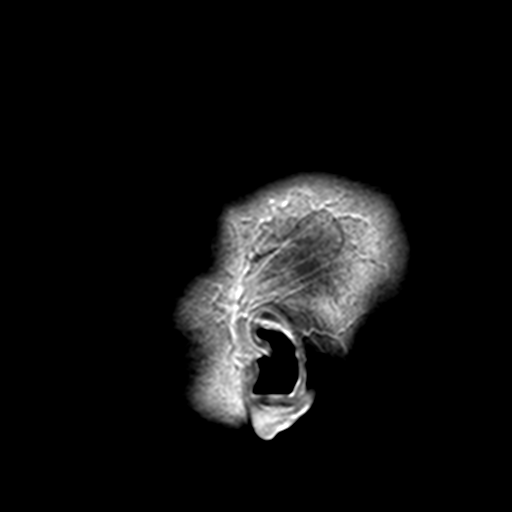

[Series 100: hx · axial · 10.0mm · 0.55mm/px · 1 of 13 slices shown]
[im 1/13]
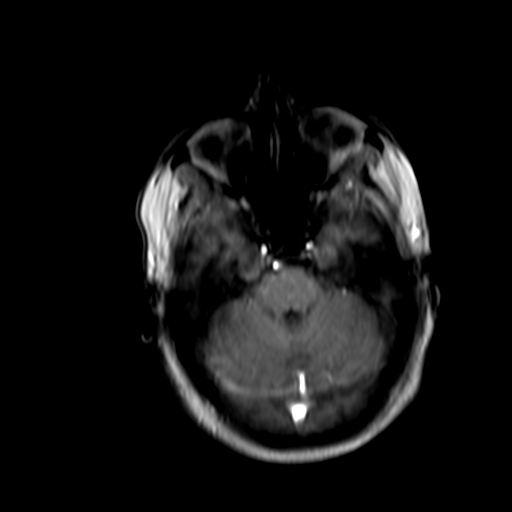

[28 of 48 positions shown; findings below may reference images not displayed]

FINDINGS: Brain:

Mild cerebral and cerebellar atrophy.

No abnormal intracranial enhancement is demonstrated to suggest
intracranial metastatic disease.

No significant white matter disease for age.

There is no acute infarct.

No evidence of intracranial mass.

No chronic intracranial blood products.

No extra-axial fluid collection.

No midline shift.

Vascular: Expected proximal arterial flow voids.

Skull and upper cervical spine: No focal marrow lesion.

Sinuses/Orbits: Visualized orbits show no acute finding. Secretions
versus small mucous retention cyst within a left ethmoid air cell.
Small bilateral maxillary sinus mucous retention cysts.
IMPRESSION: No evidence of intracranial metastatic disease.

Mild parenchymal atrophy.

Mild paranasal sinus disease as described.

## 2020-06-22 MED ORDER — GADOBUTROL 1 MMOL/ML IV SOLN
9.0000 mL | Freq: Once | INTRAVENOUS | Status: AC | PRN
  Administered 2020-06-22: 9 mL via INTRAVENOUS

## 2020-06-23 ENCOUNTER — Other Ambulatory Visit: Payer: Self-pay | Admitting: Cardiology

## 2020-06-24 ENCOUNTER — Other Ambulatory Visit: Payer: Self-pay | Admitting: *Deleted

## 2020-06-24 ENCOUNTER — Encounter: Payer: Self-pay | Admitting: *Deleted

## 2020-06-24 DIAGNOSIS — C3492 Malignant neoplasm of unspecified part of left bronchus or lung: Secondary | ICD-10-CM

## 2020-06-24 MED ORDER — ONDANSETRON HCL 8 MG PO TABS: 8.0000 mg | ORAL_TABLET | Freq: Two times a day (BID) | ORAL | 1 refills | Status: DC | PRN

## 2020-06-24 MED ORDER — FOLIC ACID 1 MG PO TABS: 1.0000 mg | ORAL_TABLET | Freq: Every day | ORAL | 3 refills | Status: DC

## 2020-06-24 MED ORDER — PROCHLORPERAZINE MALEATE 10 MG PO TABS: 10.0000 mg | ORAL_TABLET | Freq: Four times a day (QID) | ORAL | 1 refills | Status: DC | PRN

## 2020-06-24 MED ORDER — DEXAMETHASONE 4 MG PO TABS: ORAL_TABLET | ORAL | 1 refills | Status: DC

## 2020-06-24 NOTE — Progress Notes (Signed)
Called patient and relayed following message:  [name], MD  P Onc Nurse Hp Call - the brain looks great!!!   [facility]   Oncology Nurse Navigator Documentation  Oncology Nurse Navigator Flowsheets 06/24/2020  Abnormal Finding Date -  Confirmed Diagnosis Date -  Diagnosis Status -  Planned Course of Treatment -  Phase of Treatment -  Surgery Actual Start Date: -  Navigator Follow Up Date: 06/27/2020  Navigator Follow Up Reason: Chemotherapy  Navigator Location [facility]  Referral Date to RadOnc/MedOnc -  Navigator Encounter Type Diagnostic Results;Scan Review;Telephone  Telephone Outgoing Call  Patient Visit Type MedOnc  Treatment Phase Pre-Tx/Tx Discussion  Barriers/Navigation Needs Coordination of Care;Education  Education Other  Interventions Education;Psycho-Social Support  Acuity Level 2-Minimal Needs (1-2 Barriers Identified)  Coordination of Care -  Education Method Verbal  Support Groups/Services Friends and Family  Time Spent with Patient 30

## 2020-06-25 ENCOUNTER — Other Ambulatory Visit: Payer: Self-pay

## 2020-06-25 ENCOUNTER — Encounter: Payer: Self-pay | Admitting: *Deleted

## 2020-06-25 ENCOUNTER — Inpatient Hospital Stay: Payer: Medicare Other

## 2020-06-25 ENCOUNTER — Other Ambulatory Visit: Payer: Self-pay | Admitting: Thoracic Surgery (Cardiothoracic Vascular Surgery)

## 2020-06-25 DIAGNOSIS — C3492 Malignant neoplasm of unspecified part of left bronchus or lung: Secondary | ICD-10-CM

## 2020-06-25 NOTE — Progress Notes (Signed)
Patient in chemotherapy education class with  Wife.  Discussed side effects of Carboplatin, Keytruda and Alimta which include but are not limited to myelosuppression, decreased appetite, fatigue, fever, allergic or infusional reaction, mucositis, cardiac toxicity, cough, SOB, altered taste, nausea and vomiting, diarrhea, constipation, elevated LFTs myalgia and arthralgias, hair loss or thinning, rash, skin dryness, nail changes, peripheral neuropathy, discolored urine, delayed wound healing, mental changes (Chemo brain), increased risk of infections, weight loss.  Reviewed infusion room and office policy and procedure and phone numbers 24 hours x 7 days a week.    Reviewed when to call the office with any concerns or problems.  Scientist, clinical (histocompatibility and immunogenetics) given.  Discussed portacath insertion and EMLA cream administration.  Antiemetic protocol and chemotherapy schedule reviewed. Patient verbalized understanding of chemotherapy indications and possible side effects.  Teachback done

## 2020-06-27 ENCOUNTER — Inpatient Hospital Stay: Payer: Medicare Other

## 2020-06-27 ENCOUNTER — Other Ambulatory Visit: Payer: Self-pay

## 2020-06-27 ENCOUNTER — Encounter: Payer: Self-pay | Admitting: *Deleted

## 2020-06-27 VITALS — BP 131/66 | HR 66 | Temp 98.6°F | Resp 18

## 2020-06-27 DIAGNOSIS — C3492 Malignant neoplasm of unspecified part of left bronchus or lung: Secondary | ICD-10-CM

## 2020-06-27 DIAGNOSIS — Z87891 Personal history of nicotine dependence: Secondary | ICD-10-CM | POA: Diagnosis not present

## 2020-06-27 DIAGNOSIS — Z902 Acquired absence of lung [part of]: Secondary | ICD-10-CM | POA: Diagnosis not present

## 2020-06-27 DIAGNOSIS — C3412 Malignant neoplasm of upper lobe, left bronchus or lung: Secondary | ICD-10-CM | POA: Diagnosis not present

## 2020-06-27 DIAGNOSIS — Z5111 Encounter for antineoplastic chemotherapy: Secondary | ICD-10-CM | POA: Diagnosis not present

## 2020-06-27 DIAGNOSIS — Z801 Family history of malignant neoplasm of trachea, bronchus and lung: Secondary | ICD-10-CM | POA: Diagnosis not present

## 2020-06-27 DIAGNOSIS — Z79899 Other long term (current) drug therapy: Secondary | ICD-10-CM | POA: Diagnosis not present

## 2020-06-27 LAB — CBC WITH DIFFERENTIAL (CANCER CENTER ONLY)
Abs Immature Granulocytes: 0.02 10*3/uL (ref 0.00–0.07)
Basophils Absolute: 0 10*3/uL (ref 0.0–0.1)
Basophils Relative: 0 %
Eosinophils Absolute: 0 10*3/uL (ref 0.0–0.5)
Eosinophils Relative: 1 %
HCT: 36 % — ABNORMAL LOW (ref 39.0–52.0)
Hemoglobin: 11.8 g/dL — ABNORMAL LOW (ref 13.0–17.0)
Immature Granulocytes: 0 %
Lymphocytes Relative: 14 %
Lymphs Abs: 1 10*3/uL (ref 0.7–4.0)
MCH: 32.8 pg (ref 26.0–34.0)
MCHC: 32.8 g/dL (ref 30.0–36.0)
MCV: 100 fL (ref 80.0–100.0)
Monocytes Absolute: 0.6 10*3/uL (ref 0.1–1.0)
Monocytes Relative: 8 %
Neutro Abs: 5.5 10*3/uL (ref 1.7–7.7)
Neutrophils Relative %: 77 %
Platelet Count: 151 10*3/uL (ref 150–400)
RBC: 3.6 MIL/uL — ABNORMAL LOW (ref 4.22–5.81)
RDW: 13.7 % (ref 11.5–15.5)
WBC Count: 7.2 10*3/uL (ref 4.0–10.5)
nRBC: 0 % (ref 0.0–0.2)

## 2020-06-27 LAB — CMP (CANCER CENTER ONLY)
ALT: 17 U/L (ref 0–44)
AST: 17 U/L (ref 15–41)
Albumin: 4.6 g/dL (ref 3.5–5.0)
Alkaline Phosphatase: 78 U/L (ref 38–126)
Anion gap: 9 (ref 5–15)
BUN: 28 mg/dL — ABNORMAL HIGH (ref 8–23)
CO2: 26 mmol/L (ref 22–32)
Calcium: 9.8 mg/dL (ref 8.9–10.3)
Chloride: 105 mmol/L (ref 98–111)
Creatinine: 1.01 mg/dL (ref 0.61–1.24)
GFR, Estimated: >60 mL/min (ref >60)
Glucose, Bld: 96 mg/dL (ref 70–99)
Potassium: 3.6 mmol/L (ref 3.5–5.1)
Sodium: 140 mmol/L (ref 135–145)
Total Bilirubin: 0.2 mg/dL — ABNORMAL LOW (ref 0.3–1.2)
Total Protein: 7.1 g/dL (ref 6.5–8.1)

## 2020-06-27 MED ORDER — SODIUM CHLORIDE 0.9 % IV SOLN
551.5000 mg | Freq: Once | INTRAVENOUS | Status: AC
  Administered 2020-06-27: 550 mg via INTRAVENOUS
  Filled 2020-06-27: qty 55

## 2020-06-27 MED ORDER — CYANOCOBALAMIN 1000 MCG/ML IJ SOLN
INTRAMUSCULAR | Status: AC
  Filled 2020-06-27: qty 1

## 2020-06-27 MED ORDER — SODIUM CHLORIDE 0.9 % IV SOLN
10.0000 mg | Freq: Once | INTRAVENOUS | Status: AC
  Administered 2020-06-27: 10 mg via INTRAVENOUS
  Filled 2020-06-27: qty 10

## 2020-06-27 MED ORDER — CYANOCOBALAMIN 1000 MCG/ML IJ SOLN
1000.0000 ug | Freq: Once | INTRAMUSCULAR | Status: AC
  Administered 2020-06-27: 1000 ug via INTRAMUSCULAR

## 2020-06-27 MED ORDER — PALONOSETRON HCL INJECTION 0.25 MG/5ML
0.2500 mg | Freq: Once | INTRAVENOUS | Status: AC
  Administered 2020-06-27: 0.25 mg via INTRAVENOUS

## 2020-06-27 MED ORDER — SODIUM CHLORIDE 0.9 % IV SOLN
Freq: Once | INTRAVENOUS | Status: AC
  Filled 2020-06-27: qty 250

## 2020-06-27 MED ORDER — SODIUM CHLORIDE 0.9 % IV SOLN
500.0000 mg/m2 | Freq: Once | INTRAVENOUS | Status: AC
  Administered 2020-06-27: 1100 mg via INTRAVENOUS
  Filled 2020-06-27: qty 4

## 2020-06-27 MED ORDER — SODIUM CHLORIDE 0.9 % IV SOLN
200.0000 mg | Freq: Once | INTRAVENOUS | Status: AC
  Administered 2020-06-27: 200 mg via INTRAVENOUS
  Filled 2020-06-27: qty 8

## 2020-06-27 MED ORDER — SODIUM CHLORIDE 0.9 % IV SOLN
150.0000 mg | Freq: Once | INTRAVENOUS | Status: AC
  Administered 2020-06-27: 150 mg via INTRAVENOUS
  Filled 2020-06-27: qty 150

## 2020-06-27 MED ORDER — PALONOSETRON HCL INJECTION 0.25 MG/5ML
INTRAVENOUS | Status: AC
  Filled 2020-06-27: qty 5

## 2020-06-27 NOTE — Patient Instructions (Signed)
[place] Discharge Instructions for Patients Receiving Chemotherapy  Today you received the following chemotherapy agents Keytruda, Alimta, Carboplatin  To help prevent nausea and vomiting after your treatment, we encourage you to take your nausea medication as prescribed by MD. **DO NOT TAKE ZOFRAN FOR 3 DAYS AFTER CHEMOTHERAPY**   If you develop nausea and vomiting that is not controlled by your nausea medication, call the clinic.   BELOW ARE SYMPTOMS THAT SHOULD BE REPORTED IMMEDIATELY:  *FEVER GREATER THAN 100.5 F  *CHILLS WITH OR WITHOUT FEVER  NAUSEA AND VOMITING THAT IS NOT CONTROLLED WITH YOUR NAUSEA MEDICATION  *UNUSUAL SHORTNESS OF BREATH  *UNUSUAL BRUISING OR BLEEDING  TENDERNESS IN MOUTH AND THROAT WITH OR WITHOUT PRESENCE OF ULCERS  *URINARY PROBLEMS  *BOWEL PROBLEMS  UNUSUAL RASH Items with * indicate a potential emergency and should be followed up as soon as possible.  Feel free to call the clinic should you have any questions or concerns. The clinic phone number is [phone].  Please show the [place] at check-in to the Emergency Department and triage nurse.

## 2020-06-27 NOTE — Progress Notes (Signed)
Pt discharged in no apparent distress. Pt left ambulatory without assistance. Pt aware of discharge instructions and verbalized understanding and had no further questions.  

## 2020-06-27 NOTE — Progress Notes (Signed)
Oncology Nurse Navigator Documentation  Oncology Nurse Navigator Flowsheets 06/27/2020  Abnormal Finding Date -  Confirmed Diagnosis Date -  Diagnosis Status -  Planned Course of Treatment -  Phase of Treatment Chemo  Chemotherapy Actual Start Date: 06/27/2020  Surgery Actual Start Date: -  Navigator Follow Up Date: 06/28/2020  Navigator Follow Up Reason: Other:  Navigator Location [facility]  Referral Date to RadOnc/MedOnc -  Navigator Encounter Type Treatment  Telephone -  Treatment Initiated Date 06/27/2020  Patient Visit Type MedOnc  Treatment Phase First Chemo Tx  Barriers/Navigation Needs Coordination of Care;Education  Education Other  Interventions Psycho-Social Support;Education  Acuity Level 2-Minimal Needs (1-2 Barriers Identified)  Coordination of Care -  Education Method Verbal  Support Groups/Services Friends and Family  Time Spent with Patient 30

## 2020-06-28 ENCOUNTER — Encounter: Payer: Self-pay | Admitting: *Deleted

## 2020-06-28 ENCOUNTER — Ambulatory Visit
Admission: RE | Admit: 2020-06-28 | Discharge: 2020-06-28 | Disposition: A | Discharge status: Home / Self Care | Payer: Medicare Other | Source: Ambulatory Visit | Attending: Thoracic Surgery (Cardiothoracic Vascular Surgery) | Admitting: Thoracic Surgery (Cardiothoracic Vascular Surgery)

## 2020-06-28 ENCOUNTER — Ambulatory Visit (INDEPENDENT_AMBULATORY_CARE_PROVIDER_SITE_OTHER): Payer: Self-pay | Admitting: Thoracic Surgery (Cardiothoracic Vascular Surgery)

## 2020-06-28 ENCOUNTER — Encounter: Payer: Self-pay | Admitting: Thoracic Surgery (Cardiothoracic Vascular Surgery)

## 2020-06-28 VITALS — BP 126/76 | HR 65 | Temp 98.1°F | Resp 18 | Ht 71.0 in | Wt 206.0 lb

## 2020-06-28 DIAGNOSIS — R918 Other nonspecific abnormal finding of lung field: Secondary | ICD-10-CM | POA: Diagnosis not present

## 2020-06-28 DIAGNOSIS — C3492 Malignant neoplasm of unspecified part of left bronchus or lung: Secondary | ICD-10-CM

## 2020-06-28 DIAGNOSIS — C3490 Malignant neoplasm of unspecified part of unspecified bronchus or lung: Secondary | ICD-10-CM | POA: Diagnosis not present

## 2020-06-28 DIAGNOSIS — Z902 Acquired absence of lung [part of]: Secondary | ICD-10-CM

## 2020-06-28 DIAGNOSIS — Z9889 Other specified postprocedural states: Secondary | ICD-10-CM | POA: Diagnosis not present

## 2020-06-28 LAB — TSH: TSH: 1.161 u[IU]/mL (ref 0.320–4.118)

## 2020-06-28 LAB — T4: T4, Total: 5.4 ug/dL (ref 4.5–12.0)

## 2020-06-28 IMAGING — CR DG CHEST 2V
2 series · 2 of 2 positions shown · non-contrast
Comparison: [DATE] and prior

CLINICAL DATA: lung cancer

EXAM:
CHEST - 2 VIEW

[w chest pa]
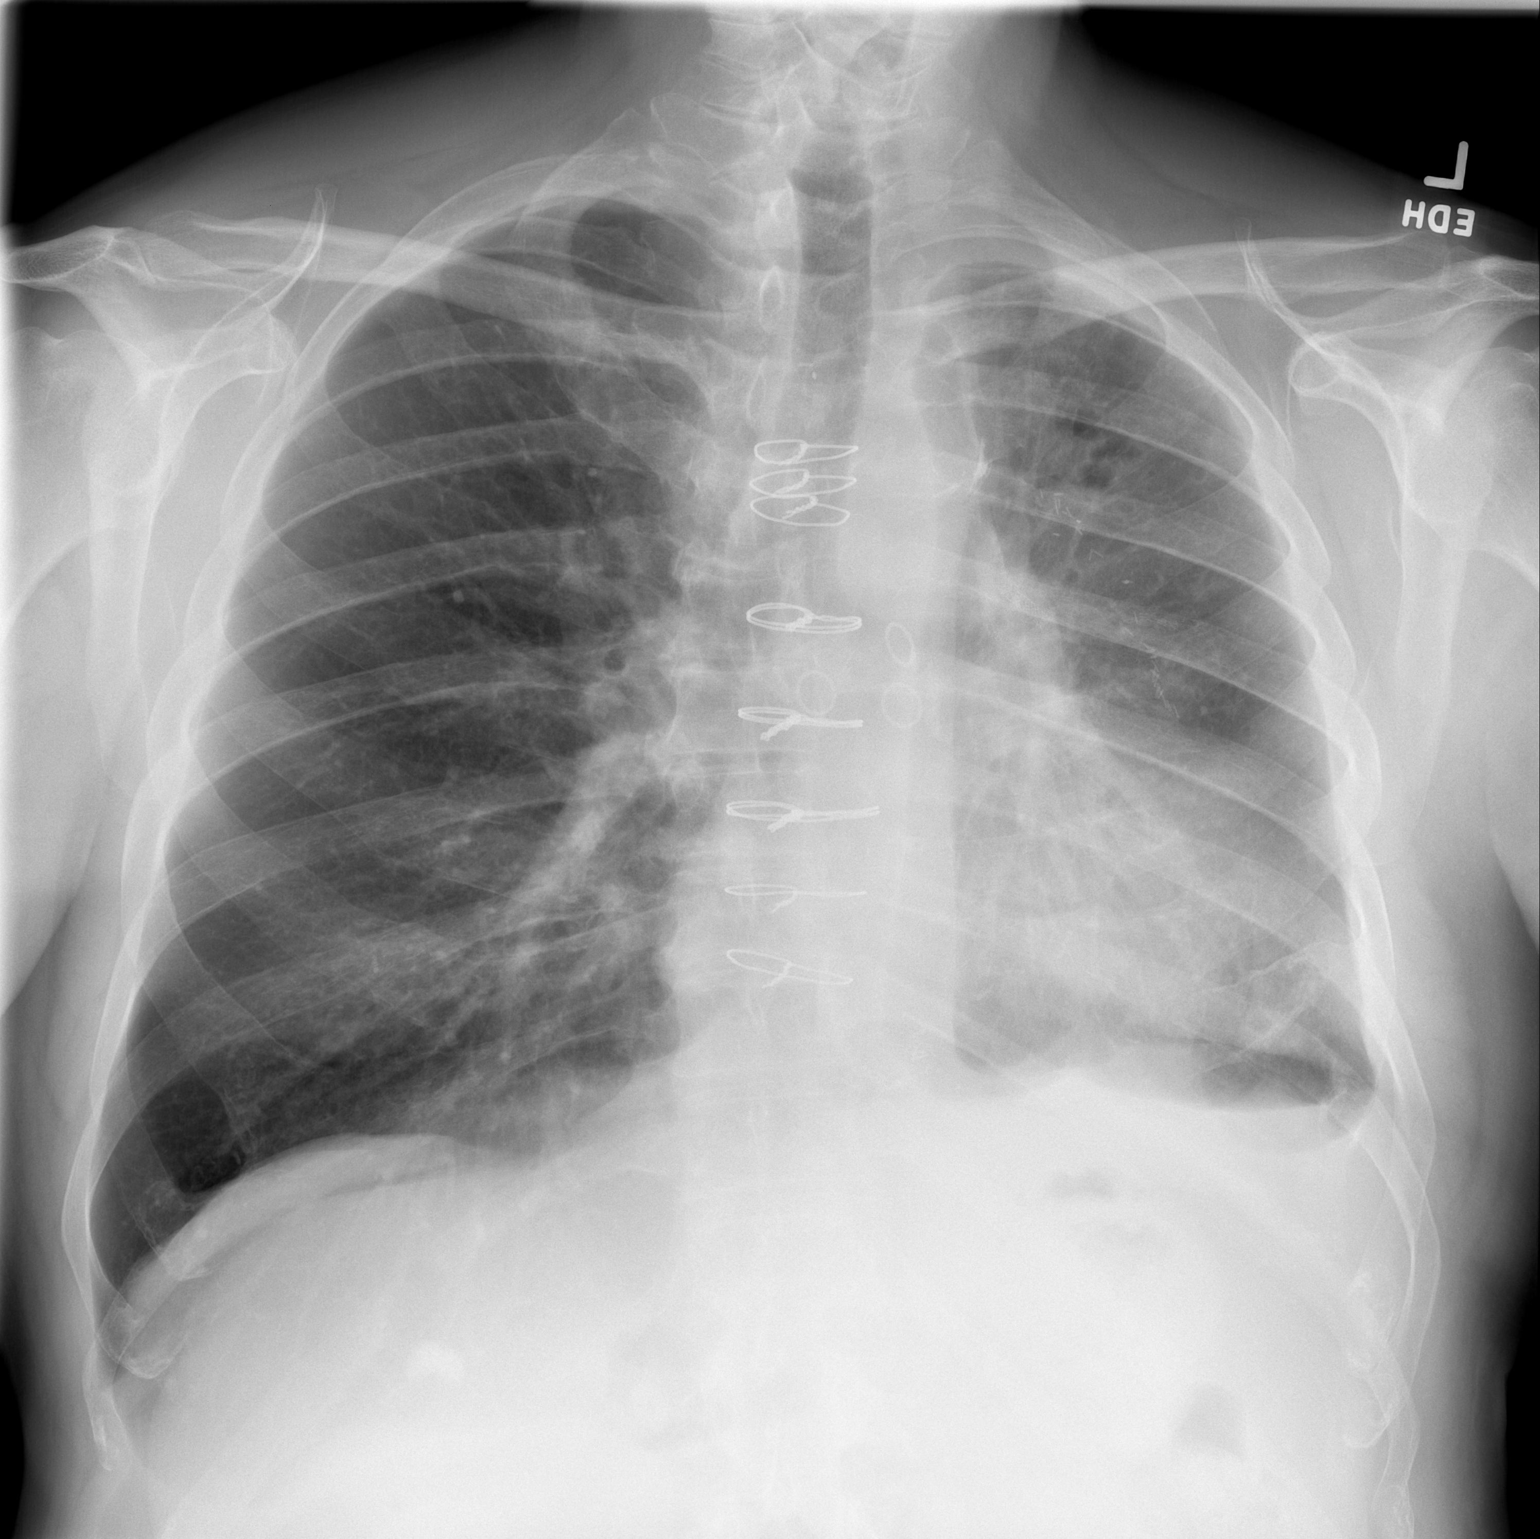

[w chest lat]
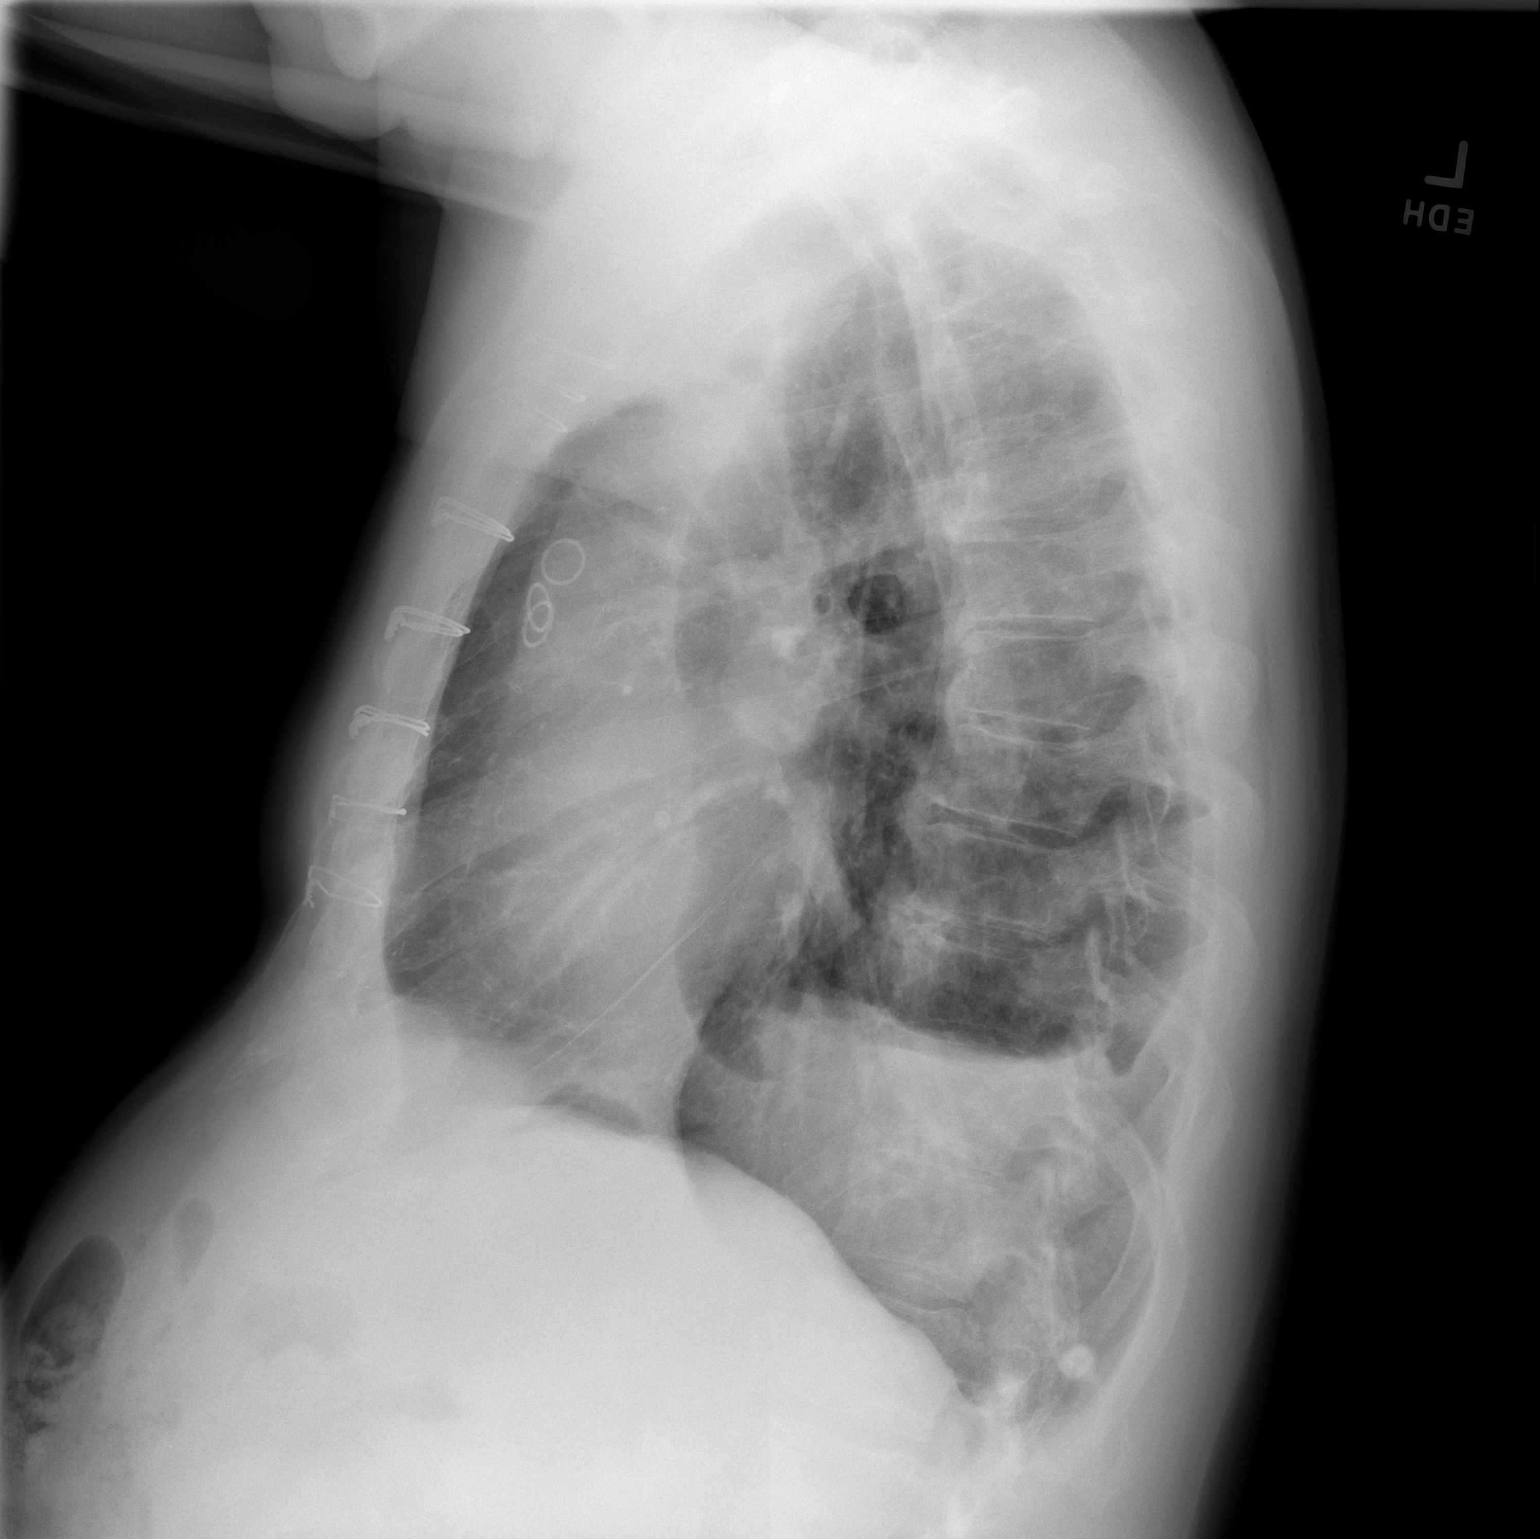

[2 of 2 positions shown; findings below may reference images not displayed]

FINDINGS: Interval removal of left chest tube. No pneumothorax. Postsurgical
appearance of the left lung with blunting of the left costophrenic
sulcus. Hazy left basilar opacities, less conspicuous than prior
exam. Patchy right basilar opacities. Stable cardiomediastinal
silhouette.
IMPRESSION: 1. Postsurgical appearance of the left lung with decreased hazy left
basilar opacities.
2. Left costophrenic sulcus blunting, scarring versus small
effusion.
3. Patchy right basilar opacities, likely atelectasis.

## 2020-06-28 NOTE — Progress Notes (Signed)
Spoke with patient after his first treatment yesterday. He states he slept well, ate a good breakfast and feels great today. He denies any symptoms. He is taking his decadron as prescribed.   Reviewed all his prn medication which he has at home if needed. Encouraged him to treat nausea earlier so that antiemetics would be more effective. Also reviewed side effects of the antiemetics if patient began to use them. He knows to call the office with any questions or concerns. Reviewed with him the availability of the on-call service outside normal office hours.   Oncology Nurse Navigator Documentation  Oncology Nurse Navigator Flowsheets 06/28/2020  Abnormal Finding Date -  Confirmed Diagnosis Date -  Diagnosis Status -  Planned Course of Treatment -  Phase of Treatment -  Chemotherapy Actual Start Date: -  Surgery Actual Start Date: -  Navigator Follow Up Date: 07/18/2020  Navigator Follow Up Reason: Follow-up Appointment;Chemotherapy  Navigator Location [facility]  Referral Date to RadOnc/MedOnc -  Navigator Encounter Type Telephone  Telephone Patient Update;Outgoing Call  Treatment Initiated Date -  Patient Visit Type MedOnc  Treatment Phase Active Tx  Barriers/Navigation Needs Coordination of Care;Education  Education Other  Interventions Education;Psycho-Social Support  Acuity Level 2-Minimal Needs (1-2 Barriers Identified)  Coordination of Care -  Education Method Verbal  Support Groups/Services Friends and Family  Time Spent with Patient 30

## 2020-06-28 NOTE — Progress Notes (Signed)
      [address]       [place] [address]             [phone]        Tameem Viscuso [facility] Medical Record #[id number] Date of Birth: November 06, 1945  Referring: [name], DO Primary Care: [name], MD Primary Cardiologist:Traci [name], MD  Reason for visit:   follow-up  History of Present Illness:     Overall Mr. Prindle is doing well.  He underwent his first cycle of chemotherapy yesterday, and currently has not had any symptoms.  He is doing well from a respiratory standpoint.  Physical Exam: BP 126/76 (BP Location: Left Arm, Patient Position: Sitting)   Pulse 65   Temp 98.1 F (36.7 C)   Resp 18   Ht 5\' 11"  (1.803 m)   Wt 206 lb (93.4 kg)   SpO2 97% Comment: RA  BMI 28.73 kg/m   Alert NAD Incision clean well healed.  Abdomen soft, ND no peripheral edema   Diagnostic Studies & Laboratory data: CXR: trace L effusion.  Lung is well expanded    Assessment / Plan:   74 yo male s/p L RATS, left upper lobectomy for [id number] Stage 4 adencarcinoma of the lung.  He is currently being treated with adjuvant therapy by Dr. Marin Olp.    He is cleared from a surgical standpoint to resume normal activity I will coordinate surveillance scans and visits with Dr. Marin Olp He is scheduled to return to see me in October of next year.    Lajuana Matte 06/28/2020 12:31 PM

## 2020-07-03 ENCOUNTER — Other Ambulatory Visit: Payer: Self-pay | Admitting: Hematology & Oncology

## 2020-07-18 ENCOUNTER — Inpatient Hospital Stay: Payer: Medicare Other

## 2020-07-18 ENCOUNTER — Inpatient Hospital Stay (HOSPITAL_BASED_OUTPATIENT_CLINIC_OR_DEPARTMENT_OTHER): Payer: Medicare Other | Admitting: Hematology & Oncology

## 2020-07-18 ENCOUNTER — Encounter: Payer: Self-pay | Admitting: *Deleted

## 2020-07-18 ENCOUNTER — Telehealth: Payer: Self-pay | Admitting: Hematology & Oncology

## 2020-07-18 ENCOUNTER — Encounter: Payer: Self-pay | Admitting: Hematology & Oncology

## 2020-07-18 ENCOUNTER — Other Ambulatory Visit: Payer: Self-pay

## 2020-07-18 VITALS — BP 126/71 | HR 80 | Temp 98.3°F | Resp 18 | Ht 71.0 in | Wt 201.8 lb

## 2020-07-18 DIAGNOSIS — C3492 Malignant neoplasm of unspecified part of left bronchus or lung: Secondary | ICD-10-CM

## 2020-07-18 DIAGNOSIS — I10 Essential (primary) hypertension: Secondary | ICD-10-CM | POA: Diagnosis not present

## 2020-07-18 DIAGNOSIS — I2510 Atherosclerotic heart disease of native coronary artery without angina pectoris: Secondary | ICD-10-CM | POA: Diagnosis not present

## 2020-07-18 DIAGNOSIS — N401 Enlarged prostate with lower urinary tract symptoms: Secondary | ICD-10-CM | POA: Diagnosis not present

## 2020-07-18 DIAGNOSIS — Z87891 Personal history of nicotine dependence: Secondary | ICD-10-CM | POA: Diagnosis not present

## 2020-07-18 DIAGNOSIS — Z902 Acquired absence of lung [part of]: Secondary | ICD-10-CM | POA: Diagnosis not present

## 2020-07-18 DIAGNOSIS — C3412 Malignant neoplasm of upper lobe, left bronchus or lung: Secondary | ICD-10-CM | POA: Diagnosis not present

## 2020-07-18 DIAGNOSIS — I25119 Atherosclerotic heart disease of native coronary artery with unspecified angina pectoris: Secondary | ICD-10-CM | POA: Diagnosis not present

## 2020-07-18 DIAGNOSIS — Z79899 Other long term (current) drug therapy: Secondary | ICD-10-CM | POA: Diagnosis not present

## 2020-07-18 DIAGNOSIS — E7800 Pure hypercholesterolemia, unspecified: Secondary | ICD-10-CM | POA: Diagnosis not present

## 2020-07-18 DIAGNOSIS — Z5111 Encounter for antineoplastic chemotherapy: Secondary | ICD-10-CM | POA: Diagnosis not present

## 2020-07-18 DIAGNOSIS — Z801 Family history of malignant neoplasm of trachea, bronchus and lung: Secondary | ICD-10-CM | POA: Diagnosis not present

## 2020-07-18 LAB — CMP (CANCER CENTER ONLY)
ALT: 20 U/L (ref 0–44)
AST: 17 U/L (ref 15–41)
Albumin: 4.6 g/dL (ref 3.5–5.0)
Alkaline Phosphatase: 79 U/L (ref 38–126)
Anion gap: 10 (ref 5–15)
BUN: 21 mg/dL (ref 8–23)
CO2: 27 mmol/L (ref 22–32)
Calcium: 10.2 mg/dL (ref 8.9–10.3)
Chloride: 102 mmol/L (ref 98–111)
Creatinine: 0.99 mg/dL (ref 0.61–1.24)
GFR, Estimated: >60 mL/min (ref >60)
Glucose, Bld: 112 mg/dL — ABNORMAL HIGH (ref 70–99)
Potassium: 3.6 mmol/L (ref 3.5–5.1)
Sodium: 139 mmol/L (ref 135–145)
Total Bilirubin: 0.3 mg/dL (ref 0.3–1.2)
Total Protein: 7.7 g/dL (ref 6.5–8.1)

## 2020-07-18 LAB — CBC WITH DIFFERENTIAL (CANCER CENTER ONLY)
Abs Immature Granulocytes: 0.04 10*3/uL (ref 0.00–0.07)
Basophils Absolute: 0 10*3/uL (ref 0.0–0.1)
Basophils Relative: 0 %
Eosinophils Absolute: 0 10*3/uL (ref 0.0–0.5)
Eosinophils Relative: 0 %
HCT: 37.4 % — ABNORMAL LOW (ref 39.0–52.0)
Hemoglobin: 12.4 g/dL — ABNORMAL LOW (ref 13.0–17.0)
Immature Granulocytes: 1 %
Lymphocytes Relative: 10 %
Lymphs Abs: 0.7 10*3/uL (ref 0.7–4.0)
MCH: 32.5 pg (ref 26.0–34.0)
MCHC: 33.2 g/dL (ref 30.0–36.0)
MCV: 98.2 fL (ref 80.0–100.0)
Monocytes Absolute: 0.6 10*3/uL (ref 0.1–1.0)
Monocytes Relative: 8 %
Neutro Abs: 6.4 10*3/uL (ref 1.7–7.7)
Neutrophils Relative %: 81 %
Platelet Count: 249 10*3/uL (ref 150–400)
RBC: 3.81 MIL/uL — ABNORMAL LOW (ref 4.22–5.81)
RDW: 13.6 % (ref 11.5–15.5)
WBC Count: 7.8 10*3/uL (ref 4.0–10.5)
nRBC: 0 % (ref 0.0–0.2)

## 2020-07-18 LAB — TSH: TSH: 1.274 u[IU]/mL (ref 0.320–4.118)

## 2020-07-18 MED ORDER — SODIUM CHLORIDE 0.9 % IV SOLN
500.0000 mg/m2 | Freq: Once | INTRAVENOUS | Status: AC
  Administered 2020-07-18: 11:00:00 1100 mg via INTRAVENOUS
  Filled 2020-07-18: qty 40

## 2020-07-18 MED ORDER — PALONOSETRON HCL INJECTION 0.25 MG/5ML
0.2500 mg | Freq: Once | INTRAVENOUS | Status: AC
  Administered 2020-07-18: 09:00:00 0.25 mg via INTRAVENOUS

## 2020-07-18 MED ORDER — SODIUM CHLORIDE 0.9 % IV SOLN
555.5000 mg | Freq: Once | INTRAVENOUS | Status: AC
  Administered 2020-07-18: 11:00:00 560 mg via INTRAVENOUS
  Filled 2020-07-18: qty 56

## 2020-07-18 MED ORDER — SODIUM CHLORIDE 0.9 % IV SOLN
200.0000 mg | Freq: Once | INTRAVENOUS | Status: AC
  Administered 2020-07-18: 10:00:00 200 mg via INTRAVENOUS
  Filled 2020-07-18: qty 8

## 2020-07-18 MED ORDER — PALONOSETRON HCL INJECTION 0.25 MG/5ML
INTRAVENOUS | Status: AC
  Filled 2020-07-18: qty 5

## 2020-07-18 MED ORDER — SODIUM CHLORIDE 0.9 % IV SOLN
Freq: Once | INTRAVENOUS | Status: AC
  Filled 2020-07-18: qty 250

## 2020-07-18 MED ORDER — SODIUM CHLORIDE 0.9 % IV SOLN
10.0000 mg | Freq: Once | INTRAVENOUS | Status: AC
  Administered 2020-07-18: 10:00:00 10 mg via INTRAVENOUS
  Filled 2020-07-18: qty 10

## 2020-07-18 MED ORDER — SODIUM CHLORIDE 0.9 % IV SOLN
150.0000 mg | Freq: Once | INTRAVENOUS | Status: AC
  Administered 2020-07-18: 10:00:00 150 mg via INTRAVENOUS
  Filled 2020-07-18: qty 150

## 2020-07-18 NOTE — Telephone Encounter (Signed)
Appointments scheduled calendar printed per 12/30 los

## 2020-07-18 NOTE — Progress Notes (Signed)
Hematology and Oncology Follow Up Visit  Rajat Staver [id number] 1946/03/01 74 y.o. 07/18/2020   Principle Diagnosis:   Stage IVA (T3N2N0) adenocarcinoma of the LUL -- resected -- (+) KRAS  Current Therapy:    S/P cycle #1 of Carbo/Alimta/Pembrolizumab     Interim History:  Mr. Varma is back for follow-up.  He has had his first cycle of chemotherapy and did well.  He had a little bit of constipation.  I told him to try some MiraLAX to see if this might help.  He really had very little in the way of nausea.  He has had no problems with fever.  He has had no problems with cough or shortness of breath.  His hair still looks quite good.  There is been no rashes.  Has had no leg swelling.  He has had no problems with headache.  He has had no bleeding.    Had a very nice Christmas.  He actually is going down to [place] with his wife to see a grandson play in a golf tournament.  I told him to make sure that he drinks a lot of water on the trip.  I told to make sure he gets out of the car every hour or so to walk around.  Currently, his performance status is ECOG 0.  Medications:  Current Outpatient Medications:  .  aspirin 81 MG tablet, Take 81 mg by mouth at bedtime. , Disp: , Rfl:  .  cholecalciferol (VITAMIN D) 1000 UNITS tablet, Take 1,000 Units by mouth at bedtime. , Disp: , Rfl:  .  clopidogrel (PLAVIX) 75 MG tablet, Take 1 tablet (75 mg total) by mouth daily., Disp: 90 tablet, Rfl: 3 .  Cyanocobalamin (B-12 PO), Take 1,000 mcg by mouth daily. , Disp: , Rfl:  .  dexamethasone (DECADRON) 4 MG tablet, Take 1 tab two times a day the day before Alimta chemo, then take 2 tabs once a day for 3 days starting the day after cisplatin., Disp: 30 tablet, Rfl: 1 .  doxazosin (CARDURA) 2 MG tablet, Take 1 tablet (2 mg total) by mouth daily. (Patient taking differently: Take 2 mg by mouth at bedtime.), Disp: 90 tablet, Rfl: 2 .  ezetimibe (ZETIA) 10 MG tablet, Take 1 tablet (10  mg total) by mouth daily., Disp: 90 tablet, Rfl: 3 .  folic acid (FOLVITE) 1 MG tablet, Take 1 tablet (1 mg total) by mouth daily. Start 5-7 days before Alimta chemotherapy. Continue until 21 days after Alimta completed., Disp: 100 tablet, Rfl: 3 .  folic acid (FOLVITE) 811 MCG tablet, Take 400 mcg by mouth daily., Disp: , Rfl:  .  Multiple Vitamin (MULTIVITAMIN) tablet, Take 1 tablet by mouth daily., Disp: , Rfl:  .  ondansetron (ZOFRAN) 8 MG tablet, Take 1 tablet (8 mg total) by mouth 2 (two) times daily as needed (Nausea or vomiting). Start if needed on the third day after cisplatin., Disp: 30 tablet, Rfl: 1 .  prochlorperazine (COMPAZINE) 10 MG tablet, Take 1 tablet (10 mg total) by mouth every 6 (six) hours as needed (Nausea or vomiting)., Disp: 30 tablet, Rfl: 1 .  ramipril (ALTACE) 10 MG capsule, TAKE TWO CAPSULES BY MOUTH DAILY, Disp: 180 capsule, Rfl: 3 .  rosuvastatin (CRESTOR) 40 MG tablet, TAKE ONE TABLET BY MOUTH EVERY NIGHT AT BEDTIME, Disp: 90 tablet, Rfl: 2 .  sertraline (ZOLOFT) 50 MG tablet, Take 50 mg by mouth in the morning and at bedtime. , Disp: , Rfl:  .  spironolactone (ALDACTONE) 25 MG tablet, TAKE ONE TABLET BY MOUTH DAILY, Disp: 90 tablet, Rfl: 3 .  nitroGLYCERIN (NITROSTAT) 0.4 MG SL tablet, Place 1 tablet (0.4 mg total) under the tongue every 5 (five) minutes as needed for chest pain. (Patient not taking: Reported on 07/18/2020), Disp: 25 tablet, Rfl: 3  Allergies:  Allergies  Allergen Reactions  . Amlodipine Swelling    Swelling of ankles    Past Medical History, Surgical history, Social history, and Family History were reviewed and updated.  Review of Systems: Review of Systems  Constitutional: Negative.   HENT:  Negative.   Eyes: Negative.   Respiratory: Negative.   Cardiovascular: Negative.   Gastrointestinal: Positive for constipation.  Endocrine: Negative.   Genitourinary: Negative.    Musculoskeletal: Negative.   Skin: Negative.   Neurological:  Negative.   Hematological: Negative.   Psychiatric/Behavioral: Negative.     Physical Exam:  height is '5\' 11"'  (1.803 m) and weight is 201 lb 12.8 oz (91.5 kg). His oral temperature is 98.3 F (36.8 C). His blood pressure is 126/71 and his pulse is 80. His respiration is 18 and oxygen saturation is 100%.   Wt Readings from Last 3 Encounters:  07/18/20 201 lb 12.8 oz (91.5 kg)  06/28/20 206 lb (93.4 kg)  06/19/20 201 lb (91.2 kg)    Physical Exam Vitals reviewed.  HENT:     Head: Normocephalic and atraumatic.     Mouth/Throat:     Mouth: Oropharynx is clear and moist.  Eyes:     Extraocular Movements: EOM normal.     Pupils: Pupils are equal, round, and reactive to light.  Cardiovascular:     Rate and Rhythm: Normal rate and regular rhythm.     Heart sounds: Normal heart sounds.  Pulmonary:     Effort: Pulmonary effort is normal.     Breath sounds: Normal breath sounds.  Abdominal:     General: Bowel sounds are normal.     Palpations: Abdomen is soft.  Musculoskeletal:        General: No tenderness, deformity or edema. Normal range of motion.     Cervical back: Normal range of motion.  Lymphadenopathy:     Cervical: No cervical adenopathy.  Skin:    General: Skin is warm and dry.     Findings: No erythema or rash.  Neurological:     Mental Status: He is alert and oriented to person, place, and time.  Psychiatric:        Mood and Affect: Mood and affect normal.        Behavior: Behavior normal.        Thought Content: Thought content normal.        Judgment: Judgment normal.      Lab Results  Component Value Date   WBC 7.8 07/18/2020   HGB 12.4 (L) 07/18/2020   HCT 37.4 (L) 07/18/2020   MCV 98.2 07/18/2020   PLT 249 07/18/2020     Chemistry      Component Value Date/Time   NA 139 07/18/2020 0756   NA 141 02/05/2020 1425   K 3.6 07/18/2020 0756   CL 102 07/18/2020 0756   CO2 27 07/18/2020 0756   BUN 21 07/18/2020 0756   BUN 21 02/05/2020 1425    CREATININE 0.99 07/18/2020 0756   CREATININE 1.01 03/26/2016 1009      Component Value Date/Time   CALCIUM 10.2 07/18/2020 0756   ALKPHOS 79 07/18/2020 0756   AST 17 07/18/2020 0756  ALT 20 07/18/2020 0756   BILITOT 0.3 07/18/2020 0756      Impression and Plan: Mr. Bukhari is a very nice 74 year old white male.  He certainly looks a lot younger.  He has an atypical stage IV lung cancer which is disease has been resected.  We will proceed with his second cycle of chemotherapy.  Again is done quite well.  He is very motivated.  He has a lot of support.  I am just very happy that he is done well.  Hopefully we can help with the constipation.  We will plan to get him back in 3 weeks for his third cycle of treatment.   [name], MD 12/30/20219:09 AM

## 2020-07-18 NOTE — Patient Instructions (Signed)
Pembrolizumab injection What is this medicine? PEMBROLIZUMAB (pem broe liz ue mab) is a monoclonal antibody. It is used to treat certain types of cancer. This medicine may be used for other purposes; ask your health care provider or pharmacist if you have questions. COMMON BRAND NAME(S): Keytruda What should I tell my health care provider before I take this medicine? They need to know if you have any of these conditions:  diabetes  immune system problems  inflammatory bowel disease  liver disease  lung or breathing disease  lupus  received or scheduled to receive an organ transplant or a stem-cell transplant that uses donor stem cells  an unusual or allergic reaction to pembrolizumab, other medicines, foods, dyes, or preservatives  pregnant or trying to get pregnant  breast-feeding How should I use this medicine? This medicine is for infusion into a vein. It is given by a health care professional in a hospital or clinic setting. A special MedGuide will be given to you before each treatment. Be sure to read this information carefully each time. Talk to your pediatrician regarding the use of this medicine in children. While this drug may be prescribed for children as young as 6 months for selected conditions, precautions do apply. Overdosage: If you think you have taken too much of this medicine contact a poison control center or emergency room at once. NOTE: This medicine is only for you. Do not share this medicine with others. What if I miss a dose? It is important not to miss your dose. Call your doctor or health care professional if you are unable to keep an appointment. What may interact with this medicine? Interactions have not been studied. Give your health care provider a list of all the medicines, herbs, non-prescription drugs, or dietary supplements you use. Also tell them if you smoke, drink alcohol, or use illegal drugs. Some items may interact with your medicine. This  list may not describe all possible interactions. Give your health care provider a list of all the medicines, herbs, non-prescription drugs, or dietary supplements you use. Also tell them if you smoke, drink alcohol, or use illegal drugs. Some items may interact with your medicine. What should I watch for while using this medicine? Your condition will be monitored carefully while you are receiving this medicine. You may need blood work done while you are taking this medicine. Do not become pregnant while taking this medicine or for 4 months after stopping it. Women should inform their doctor if they wish to become pregnant or think they might be pregnant. There is a potential for serious side effects to an unborn child. Talk to your health care professional or pharmacist for more information. Do not breast-feed an infant while taking this medicine or for 4 months after the last dose. What side effects may I notice from receiving this medicine? Side effects that you should report to your doctor or health care professional as soon as possible:  allergic reactions like skin rash, itching or hives, swelling of the face, lips, or tongue  bloody or black, tarry  breathing problems  changes in vision  chest pain  chills  confusion  constipation  cough  diarrhea  dizziness or feeling faint or lightheaded  fast or irregular heartbeat  fever  flushing  joint pain  low blood counts - this medicine may decrease the number of white blood cells, red blood cells and platelets. You may be at increased risk for infections and bleeding.  muscle pain  muscle   weakness  pain, tingling, numbness in the hands or feet  persistent headache  redness, blistering, peeling or loosening of the skin, including inside the mouth  signs and symptoms of high blood sugar such as dizziness; dry mouth; dry skin; fruity breath; nausea; stomach pain; increased hunger or thirst; increased urination  signs  and symptoms of kidney injury like trouble passing urine or change in the amount of urine  signs and symptoms of liver injury like dark urine, light-colored stools, loss of appetite, nausea, right upper belly pain, yellowing of the eyes or skin  sweating  swollen lymph nodes  weight loss Side effects that usually do not require medical attention (report to your doctor or health care professional if they continue or are bothersome):  decreased appetite  hair loss  muscle pain  tiredness This list may not describe all possible side effects. Call your doctor for medical advice about side effects. You may report side effects to FDA at 1-800-FDA-1088. Where should I keep my medicine? This drug is given in a hospital or clinic and will not be stored at home. NOTE: This sheet is a summary. It may not cover all possible information. If you have questions about this medicine, talk to your doctor, pharmacist, or health care provider.  2020 Elsevier/Gold Standard (2019-05-12 18:07:58)  

## 2020-07-18 NOTE — Progress Notes (Signed)
Spoke with patient after his MD appointment. He did pretty well with his first cycle. He did experience about 4 days of feeling "down" with decreased energy and motivation. He states he had no n/v, some constipation, but was able to eat and drink well. He is ready to start his next cycle.  Oncology Nurse Navigator Documentation  Oncology Nurse Navigator Flowsheets 07/18/2020  Abnormal Finding Date -  Confirmed Diagnosis Date -  Diagnosis Status -  Planned Course of Treatment -  Phase of Treatment -  Chemotherapy Actual Start Date: -  Surgery Actual Start Date: -  Navigator Follow Up Date: 08/09/2020  Navigator Follow Up Reason: Follow-up Appointment;Chemotherapy  Navigator Location [facility]  Referral Date to RadOnc/MedOnc -  Navigator Encounter Type Treatment  Telephone -  Treatment Initiated Date -  Patient Visit Type MedOnc  Treatment Phase Active Tx  Barriers/Navigation Needs Coordination of Care;Education  Education Pain/ Symptom Management  Interventions Psycho-Social Support;Education  Acuity Level 2-Minimal Needs (1-2 Barriers Identified)  Coordination of Care -  Education Method Verbal  Support Groups/Services Friends and Family  Time Spent with Patient 30

## 2020-07-18 NOTE — Progress Notes (Signed)
Pt discharged in no apparent distress. Pt left ambulatory without assistance. Pt aware of discharge instructions and verbalized understanding and had no further questions.  

## 2020-07-19 LAB — T4: T4, Total: 4.6 ug/dL (ref 4.5–12.0)

## 2020-07-29 ENCOUNTER — Telehealth: Payer: Self-pay | Admitting: *Deleted

## 2020-07-29 NOTE — Telephone Encounter (Signed)
Message received from patient's wife, Pamala Hurry stating that pt started with cold like symptoms and cough on 07/25/20, took an at home Covid-19 test today and tested positive.  Dr. Marin Olp notified.  Patient's information emailed to the [place] hotline.  Call placed back to patient's wife to inform her that pt.'s information has been emailed to the [place] hotline and that pt.'s appts will need to be moved out  21 days after positive test day of today, 07/29/20.  Message sent to scheduling.

## 2020-07-29 NOTE — Telephone Encounter (Signed)
Please advise on patient mychart message  Dr. Valeta Harms,  I have an appointment with you on January 17th,  but I was wondering if you have any time earlier that you could see me.  The reason is that I am coughing a lot.  So much so that I did not sleep very well last night.  It is in my upper respiratory area.   This has been going on since my surgery in November to remove my upper left lobe.  It stayed the same for a time but has recently gotten worse.   If this is not something I should be overly concerned about, I will just wait for the appointment on the 17th.

## 2020-07-30 ENCOUNTER — Encounter: Payer: Self-pay | Admitting: *Deleted

## 2020-07-30 ENCOUNTER — Telehealth: Payer: Self-pay

## 2020-07-30 NOTE — Telephone Encounter (Signed)
Wife states that patient tested positive with home test yesterday and has an appointment with you on 1/17.Would you be willing to switch this to a Televisit with him since it is a follow up after bronch?

## 2020-07-30 NOTE — Telephone Encounter (Signed)
I connected by phone with John Hinton on 07/30/2020 at 2:22 PM to discuss the potential use of a new treatment for mild to moderate COVID-19 viral infection in non-hospitalized patients.  This patient is a 75 y.o. male that meets the FDA criteria for Emergency Use Authorization of COVID monoclonal antibody sotrovimab.  Has a (+) direct SARS-CoV-2 viral test result  Has mild or moderate COVID-19   Is NOT hospitalized due to COVID-19  Is within 10 days of symptom onset  Has at least one of the high risk factor(s) for progression to severe COVID-19 and/or hospitalization as defined in EUA.  Specific high risk criteria : Older age (>/= 73 yo) and Other high risk medical condition per CDC:  Lung cancer   I have spoken and communicated the following to the patient or parent/caregiver regarding COVID monoclonal antibody treatment:  1. FDA has authorized the emergency use for the treatment of mild to moderate COVID-19 in adults and pediatric patients with positive results of direct SARS-CoV-2 viral testing who are 49 years of age and older weighing at least 40 kg, and who are at high risk for progressing to severe COVID-19 and/or hospitalization.  2. The significant known and potential risks and benefits of COVID monoclonal antibody, and the extent to which such potential risks and benefits are unknown.  3. Information on available alternative treatments and the risks and benefits of those alternatives, including clinical trials.  4. Patients treated with COVID monoclonal antibody should continue to self-isolate and use infection control measures (e.g., wear mask, isolate, social distance, avoid sharing personal items, clean and disinfect "high touch" surfaces, and frequent handwashing) according to CDC guidelines.   5. The patient or parent/caregiver has the option to accept or refuse COVID monoclonal antibody treatment.  After reviewing this information with the patient, the patient has  DECLINED offer to receive the infusion. Marcello Moores, RN 07/30/2020 2:22 PM  Pt. Reports his symptoms of cough and congestion started 07/23/20 - out of 7 day window for treatment.

## 2020-07-31 NOTE — Telephone Encounter (Signed)
Dr. Valeta Harms please advise if the patient's at home test is enough or does he need to go to get a PCR test.

## 2020-08-01 ENCOUNTER — Other Ambulatory Visit: Payer: Medicare Other

## 2020-08-01 DIAGNOSIS — Z20822 Contact with and (suspected) exposure to covid-19: Secondary | ICD-10-CM

## 2020-08-03 LAB — SARS-COV-2, NAA 2 DAY TAT

## 2020-08-03 LAB — NOVEL CORONAVIRUS, NAA: SARS-CoV-2, NAA: DETECTED — AB

## 2020-08-05 ENCOUNTER — Ambulatory Visit (INDEPENDENT_AMBULATORY_CARE_PROVIDER_SITE_OTHER): Payer: Medicare Other | Admitting: Pulmonary Disease

## 2020-08-05 ENCOUNTER — Other Ambulatory Visit: Payer: Self-pay

## 2020-08-05 DIAGNOSIS — U071 COVID-19: Secondary | ICD-10-CM | POA: Diagnosis not present

## 2020-08-05 DIAGNOSIS — C3492 Malignant neoplasm of unspecified part of left bronchus or lung: Secondary | ICD-10-CM

## 2020-08-05 NOTE — Progress Notes (Signed)
Virtual Visit via Telephone Note  I connected with John Hinton on 08/05/20 at  1:30 PM EST by telephone and verified that I am speaking with the correct person using two identifiers.  Location: Patient: Home Provider: Office   I discussed the limitations, risks, security and privacy concerns of performing an evaluation and management service by telephone and the availability of in person appointments. I also discussed with the patient that there may be a patient responsible charge related to this service. The patient expressed understanding and agreed to proceed.   History of Present Illness:  John Hinton is a 75 year old male, former smoker with coronary artery disease and obstructive sleep apnea on CPAP who is referred to pulmonary clinic for exertional shortness of breath.  He reports noticing increasing dyspnea with activities over the past 1 year. He notices more dyspnea when playing golf or when he was on a ski trip this past winter. He does have occasional chest tightness with the exertional shortness of breath. He denies cough or wheezing. He denies sinus issues or reflux issues. He has visited with his cardiology team for the shortness of breath and ECHO showed EF 55-60%, mild LVH, and grade I diastolic dysfunction. RV systolic function and size is normal with normal estimated PA pressure. The NM cardiac stress test indicated mild reduction in EF at 46% and no new areas of ischemia. He was then ordered for pulmonary function tests which are unremarkable with normal spirometry, lung volumes and diffusion capacity.   He is compliant with his CPAP per the cardiology visit, his AHI is 2.5/hr on 8cmH2O with 90% compliance.   He is a former smoker, quit in the 1980s. 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 He does not have exposure to birds or exotic birds.  OV 08/05/2020: Here today for follow-up after bronchoscopy  and referral to thoracic surgery.  Office visit converted to telephone visit due to inclement weather and patient's recent positive diagnosis for COVID-19.  Patient initially had pulmonary function tests which were completed that looked good after finding a left upper lobe mass on CT imaging.  Patient was referred to me for navigational bronchoscopy for tissue sampling.  Patient was diagnosed after the navigational bronchoscopy with adenocarcinoma of the left upper lobe and was referred to thoracic surgery.  Patient's pet imaging was negative with no additional sites of disease.  However following resection the path specimen ended up diagnosing the patient with a stage IVa adenocarcinoma of the left upper lobe due to pleural invasion with associated parenchymal tumor deposits for M1 a disease.  Therefore patient was diagnosed with stage IVa adenocarcinoma.  Patient has since established care with Dr. Marin Olp from medical oncology and has started his chemotherapy cycle.  During this process after 2 chemotherapy sessions he has developed COVID-19.  He was diagnosed with this last week after his wife was diagnosed.  He was sick for a few days and has isolated.  He has slowly recovering from this.  He still does feel short of breath with exertion.  His cough has improved.  At this point he has been released from surgery and is following with medical oncology.    Observations/Objective:  PFT Results Latest Ref Rng & Units 02/29/2020  FVC-Pre L 4.50  FVC-Predicted Pre % 95  FVC-Post L 4.54  FVC-Predicted Post % 96  Pre FEV1/FVC % % 73  Post FEV1/FCV % % 75  FEV1-Pre L 3.26  FEV1-Predicted Pre % 94  FEV1-Post L 3.39  DLCO uncorrected ml/min/mmHg 29.00  DLCO UNC% % 106  DLCO corrected ml/min/mmHg 29.00  DLCO COR %Predicted % 106  DLVA Predicted % 113  TLC L 7.10  TLC % Predicted % 95  RV % Predicted % 102   05/22/2020 surgical pathology results: Adenocarcinoma 5.7 cm, visceral pleural invasion,  multiple pleural tumor deposits.  Pathology results reviewed.  Results for BUCK, MCAFFEE" (MRN [id number]) as of 08/05/2020 10:14  Ref. Range 08/01/2020 11:17  NOVEL CORONAVIRUS, NAA Unknown POSITIVE    Assessment and Plan:  Stage IVa adenocarcinoma diagnosed following surgical resection which was initially presumed as a stage Ic.  However with pathology consistent with multiple pleural tumor deposits confirming diagnosis of stage IV disease.  Even though the patient did have a reassuring PET scan. Recent COVID-19 infection, immunized x2 Plan: Continue follow-up with medical oncology for chemotherapy  Supportive care at this time for COVID-19 Patient to contact our office if he has any worsening symptoms or change in clinical status. Follow-up with [place] in 3 months after he has completed his chemotherapy cycles.  Follow Up Instructions:  RTC to see me or APP in 3 months   I discussed the assessment and treatment plan with the patient. The patient was provided an opportunity to ask questions and all were answered. The patient agreed with the plan and demonstrated an understanding of the instructions.   The patient was advised to call back or seek an in-person evaluation if the symptoms worsen or if the condition fails to improve as anticipated.  I provided 22 minutes of non-face-to-face time during this encounter.   [name], DO

## 2020-08-09 ENCOUNTER — Other Ambulatory Visit: Payer: Medicare Other

## 2020-08-09 ENCOUNTER — Ambulatory Visit: Payer: Medicare Other | Admitting: Hematology & Oncology

## 2020-08-09 ENCOUNTER — Ambulatory Visit: Payer: Medicare Other

## 2020-08-22 ENCOUNTER — Ambulatory Visit (HOSPITAL_BASED_OUTPATIENT_CLINIC_OR_DEPARTMENT_OTHER)
Admission: RE | Admit: 2020-08-22 | Discharge: 2020-08-22 | Disposition: A | Discharge status: Home / Self Care | Payer: Medicare Other | Source: Ambulatory Visit | Attending: Hematology & Oncology | Admitting: Hematology & Oncology

## 2020-08-22 ENCOUNTER — Inpatient Hospital Stay (HOSPITAL_BASED_OUTPATIENT_CLINIC_OR_DEPARTMENT_OTHER): Payer: Medicare Other | Admitting: Hematology & Oncology

## 2020-08-22 ENCOUNTER — Encounter: Payer: Self-pay | Admitting: *Deleted

## 2020-08-22 ENCOUNTER — Other Ambulatory Visit: Payer: Self-pay

## 2020-08-22 ENCOUNTER — Telehealth: Payer: Self-pay | Admitting: *Deleted

## 2020-08-22 ENCOUNTER — Inpatient Hospital Stay: Payer: Medicare Other

## 2020-08-22 ENCOUNTER — Encounter: Payer: Self-pay | Admitting: Hematology & Oncology

## 2020-08-22 ENCOUNTER — Inpatient Hospital Stay: Payer: Medicare Other | Attending: Hematology & Oncology

## 2020-08-22 VITALS — BP 135/87 | HR 78 | Temp 98.5°F | Resp 20 | Wt 200.8 lb

## 2020-08-22 DIAGNOSIS — I82621 Acute embolism and thrombosis of deep veins of right upper extremity: Secondary | ICD-10-CM | POA: Diagnosis not present

## 2020-08-22 DIAGNOSIS — Z5111 Encounter for antineoplastic chemotherapy: Secondary | ICD-10-CM | POA: Diagnosis not present

## 2020-08-22 DIAGNOSIS — C3492 Malignant neoplasm of unspecified part of left bronchus or lung: Secondary | ICD-10-CM

## 2020-08-22 DIAGNOSIS — I8001 Phlebitis and thrombophlebitis of superficial vessels of right lower extremity: Secondary | ICD-10-CM | POA: Diagnosis not present

## 2020-08-22 DIAGNOSIS — Z79899 Other long term (current) drug therapy: Secondary | ICD-10-CM | POA: Diagnosis not present

## 2020-08-22 DIAGNOSIS — Z5112 Encounter for antineoplastic immunotherapy: Secondary | ICD-10-CM | POA: Diagnosis not present

## 2020-08-22 DIAGNOSIS — I82611 Acute embolism and thrombosis of superficial veins of right upper extremity: Secondary | ICD-10-CM | POA: Diagnosis not present

## 2020-08-22 DIAGNOSIS — C3412 Malignant neoplasm of upper lobe, left bronchus or lung: Secondary | ICD-10-CM | POA: Diagnosis not present

## 2020-08-22 DIAGNOSIS — I808 Phlebitis and thrombophlebitis of other sites: Secondary | ICD-10-CM | POA: Diagnosis not present

## 2020-08-22 LAB — CBC WITH DIFFERENTIAL (CANCER CENTER ONLY)
Abs Immature Granulocytes: 0.05 10*3/uL (ref 0.00–0.07)
Basophils Absolute: 0 10*3/uL (ref 0.0–0.1)
Basophils Relative: 0 %
Eosinophils Absolute: 0 10*3/uL (ref 0.0–0.5)
Eosinophils Relative: 0 %
HCT: 35.7 % — ABNORMAL LOW (ref 39.0–52.0)
Hemoglobin: 11.8 g/dL — ABNORMAL LOW (ref 13.0–17.0)
Immature Granulocytes: 1 %
Lymphocytes Relative: 7 %
Lymphs Abs: 0.8 10*3/uL (ref 0.7–4.0)
MCH: 31.3 pg (ref 26.0–34.0)
MCHC: 33.1 g/dL (ref 30.0–36.0)
MCV: 94.7 fL (ref 80.0–100.0)
Monocytes Absolute: 0.8 10*3/uL (ref 0.1–1.0)
Monocytes Relative: 7 %
Neutro Abs: 9.2 10*3/uL — ABNORMAL HIGH (ref 1.7–7.7)
Neutrophils Relative %: 85 %
Platelet Count: 207 10*3/uL (ref 150–400)
RBC: 3.77 MIL/uL — ABNORMAL LOW (ref 4.22–5.81)
RDW: 15 % (ref 11.5–15.5)
WBC Count: 10.9 10*3/uL — ABNORMAL HIGH (ref 4.0–10.5)
nRBC: 0 % (ref 0.0–0.2)

## 2020-08-22 LAB — CMP (CANCER CENTER ONLY)
ALT: 16 U/L (ref 0–44)
AST: 16 U/L (ref 15–41)
Albumin: 4.6 g/dL (ref 3.5–5.0)
Alkaline Phosphatase: 69 U/L (ref 38–126)
Anion gap: 10 (ref 5–15)
BUN: 22 mg/dL (ref 8–23)
CO2: 24 mmol/L (ref 22–32)
Calcium: 10 mg/dL (ref 8.9–10.3)
Chloride: 103 mmol/L (ref 98–111)
Creatinine: 0.92 mg/dL (ref 0.61–1.24)
GFR, Estimated: >60 mL/min (ref >60)
Glucose, Bld: 125 mg/dL — ABNORMAL HIGH (ref 70–99)
Potassium: 3.9 mmol/L (ref 3.5–5.1)
Sodium: 137 mmol/L (ref 135–145)
Total Bilirubin: 0.3 mg/dL (ref 0.3–1.2)
Total Protein: 7.5 g/dL (ref 6.5–8.1)

## 2020-08-22 LAB — TSH: TSH: 0.993 u[IU]/mL (ref 0.320–4.118)

## 2020-08-22 LAB — LACTATE DEHYDROGENASE: LDH: 143 U/L (ref 98–192)

## 2020-08-22 IMAGING — US US EXTREM  UP VENOUS*R*
1 series · 13 of 24 positions shown · non-contrast
Comparison: None.

CLINICAL DATA: Right arm thrombus



[Series 1: us extrem up venous*right* · 13 of 38 slices shown]
[im 1/38]
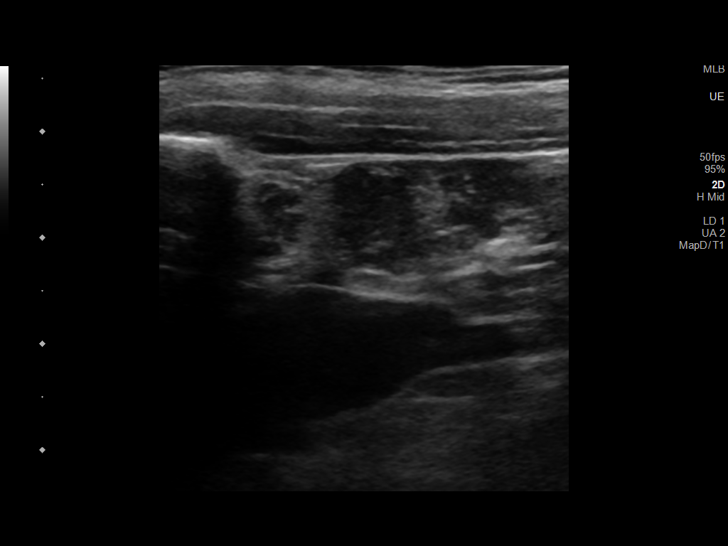
[im 4/38]
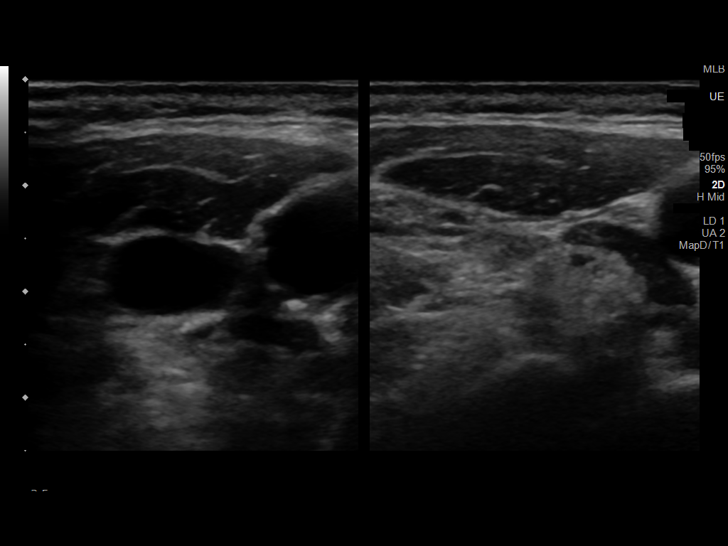
[im 7/38]
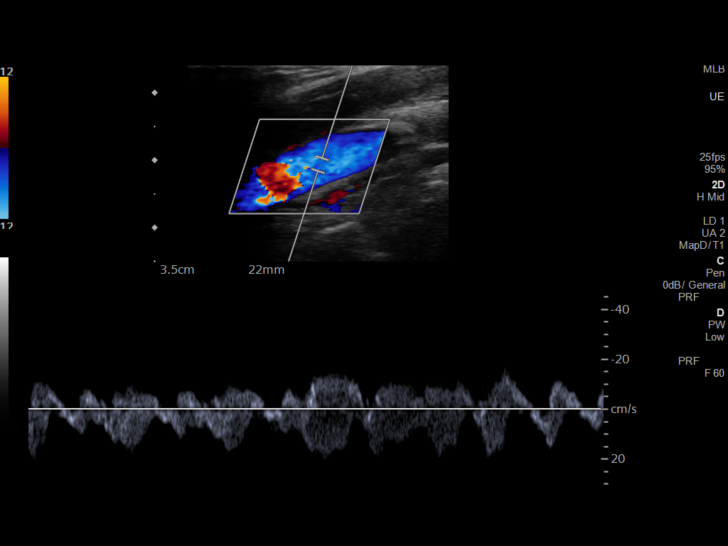
[im 10/38]
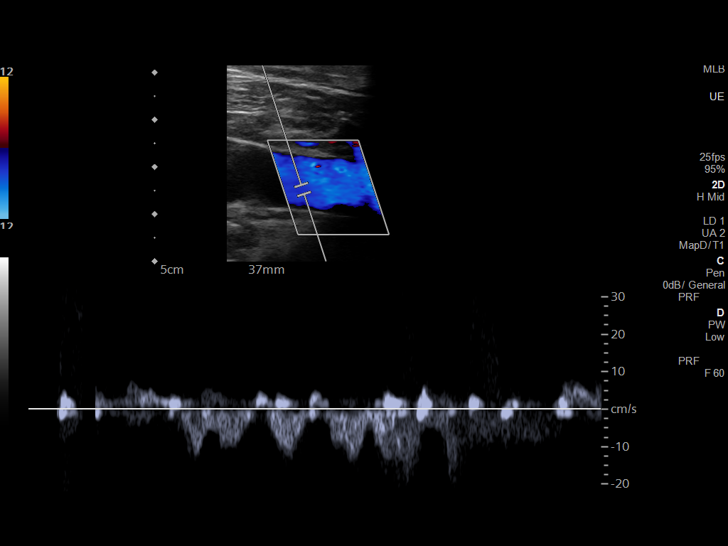
[im 13/38]
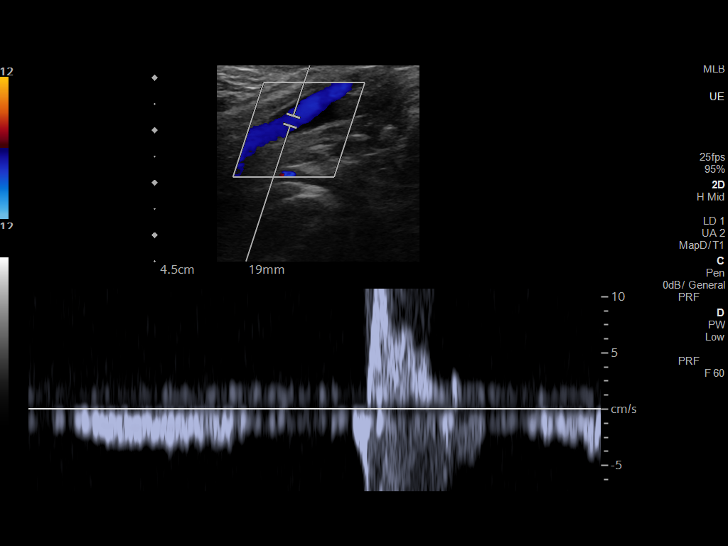
[im 17/38]
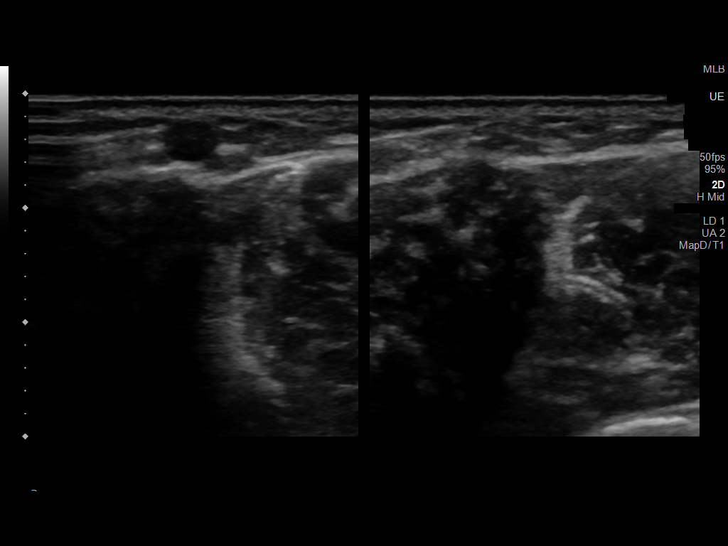
[im 20/38]
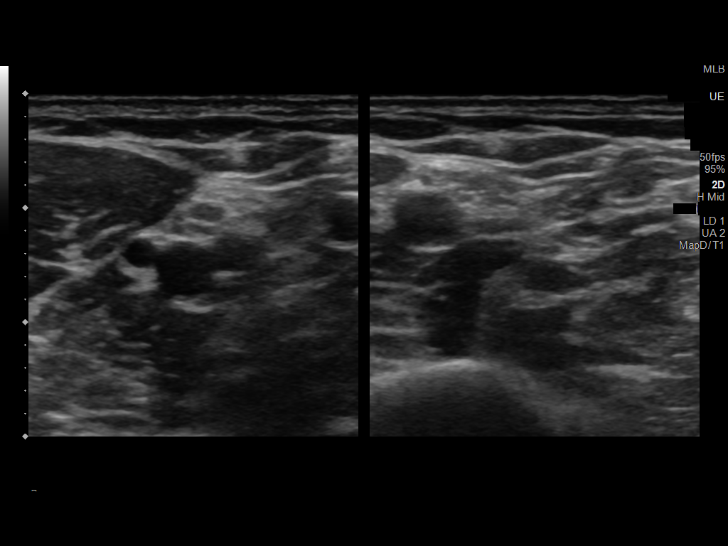
[im 21/38]
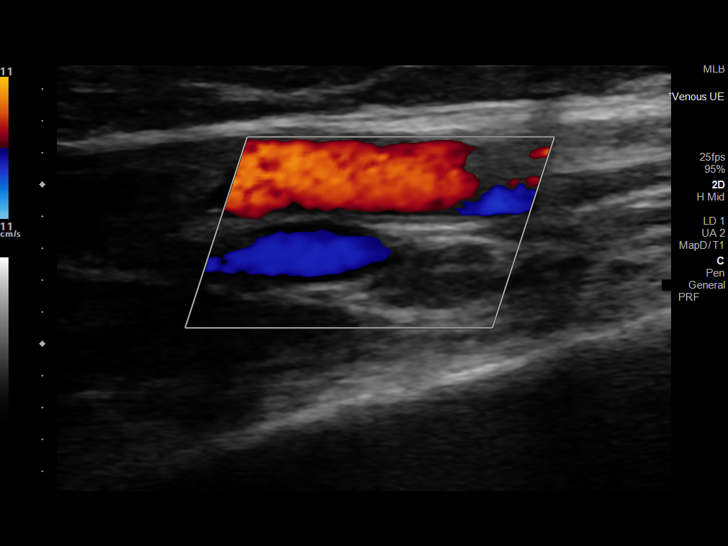
[im 25/38]
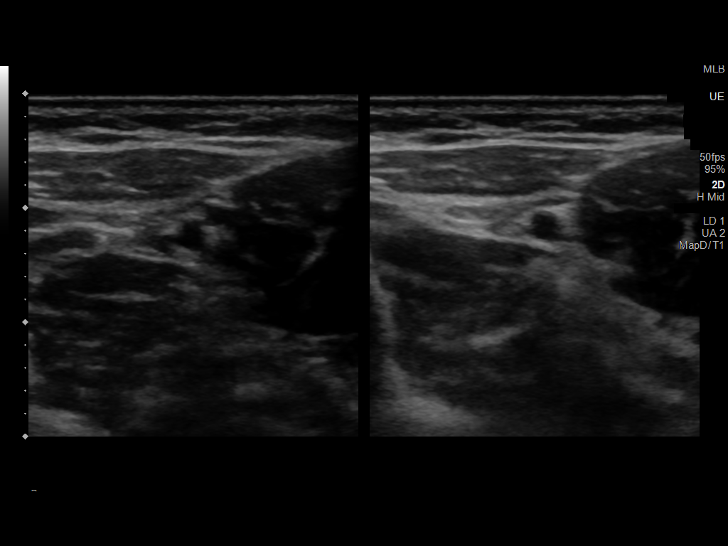
[im 28/38]
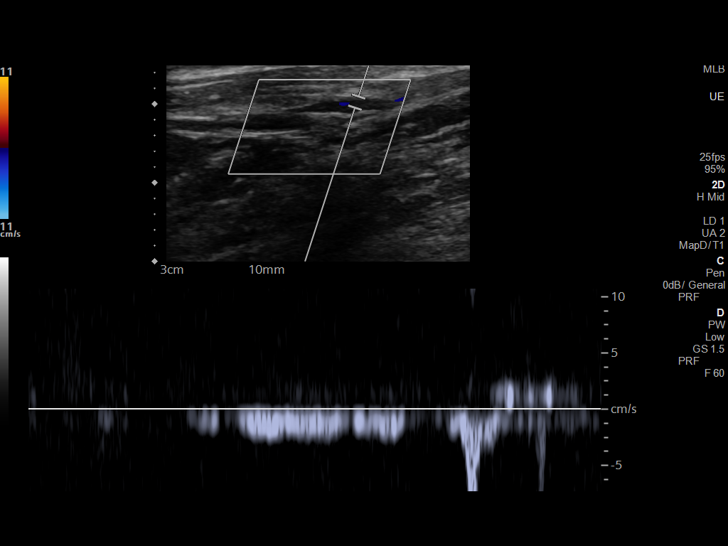
[im 31/38]
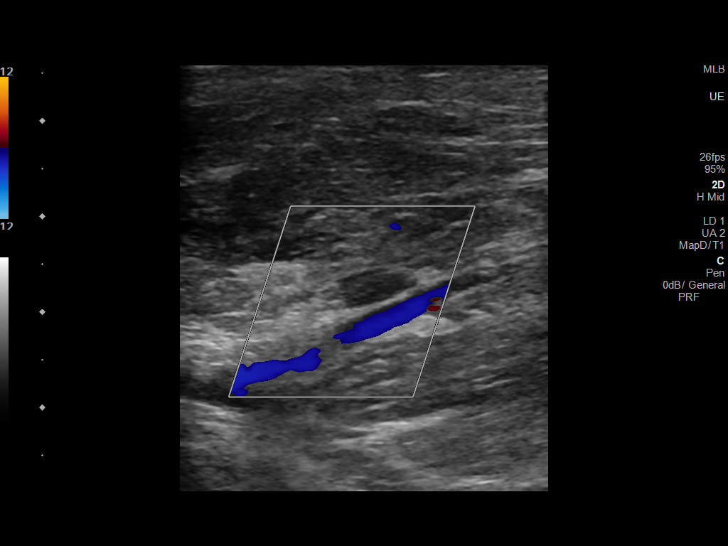
[im 34/38]
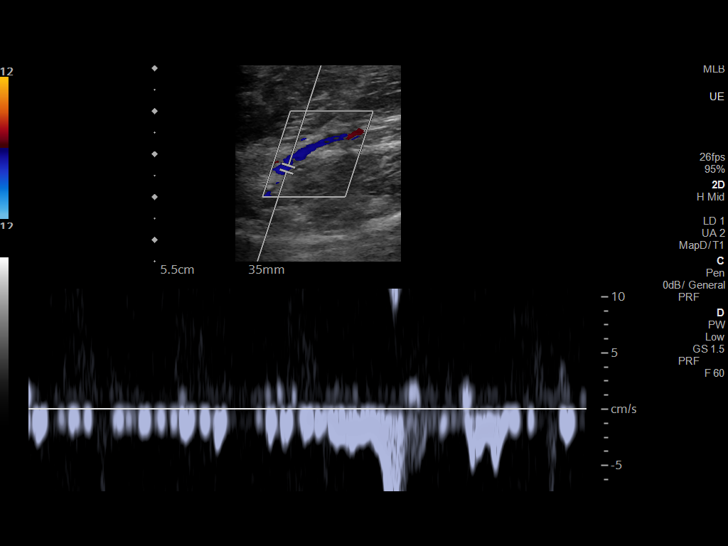
[im 38/38]
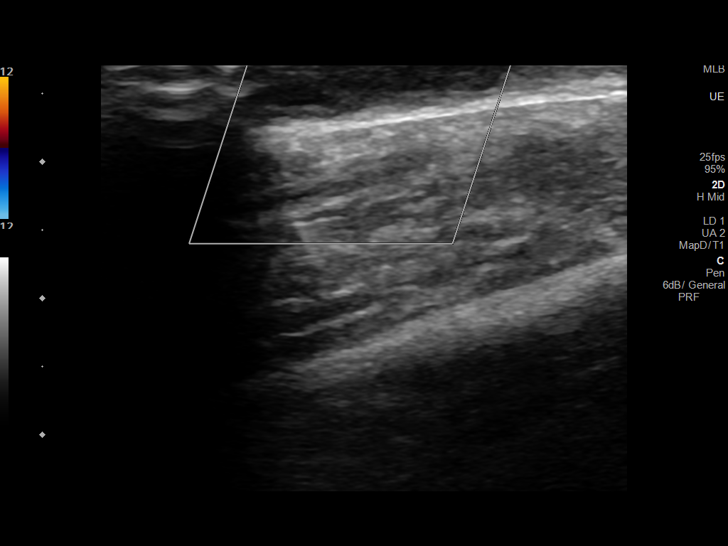

[13 of 24 positions shown; findings below may reference images not displayed]

FINDINGS: Contralateral Subclavian Vein: Respiratory phasicity is normal and
symmetric with the symptomatic side. No evidence of thrombus. Normal
compressibility.

Internal Jugular Vein: No evidence of thrombus. Normal
compressibility, respiratory phasicity and response to augmentation.

Subclavian Vein: No evidence of thrombus. Normal compressibility,
respiratory phasicity and response to augmentation.

Axillary Vein: No evidence of thrombus. Normal compressibility,
respiratory phasicity and response to augmentation.

Cephalic Vein: No evidence of thrombus. Normal compressibility,
respiratory phasicity and response to augmentation.

Basilic Vein: No evidence of thrombus. Normal compressibility,
respiratory phasicity and response to augmentation.

Brachial Veins: No evidence of thrombus. Normal compressibility,
respiratory phasicity and response to augmentation.

Radial Veins: No evidence of thrombus. Normal compressibility,
respiratory phasicity and response to augmentation.

Ulnar Veins: No evidence of thrombus. Normal compressibility,
respiratory phasicity and response to augmentation.

Venous Reflux:  None visualized.

Other Findings: Segmental thrombosis and noncompressibility of a
superficial vein in the anterior right forearm.
IMPRESSION: 1. Negative for right upper extremity DVT.
2. Segmental superficial thrombophlebitis, right forearm.

## 2020-08-22 MED ORDER — FOSAPREPITANT DIMEGLUMINE INJECTION 150 MG
150.0000 mg | Freq: Once | INTRAVENOUS | Status: AC
  Administered 2020-08-22: 150 mg via INTRAVENOUS
  Filled 2020-08-22: qty 150

## 2020-08-22 MED ORDER — SODIUM CHLORIDE 0.9 % IV SOLN
555.5000 mg | Freq: Once | INTRAVENOUS | Status: AC
  Administered 2020-08-22: 560 mg via INTRAVENOUS
  Filled 2020-08-22: qty 56

## 2020-08-22 MED ORDER — PALONOSETRON HCL INJECTION 0.25 MG/5ML
INTRAVENOUS | Status: AC
  Filled 2020-08-22: qty 5

## 2020-08-22 MED ORDER — SODIUM CHLORIDE 0.9 % IV SOLN
10.0000 mg | Freq: Once | INTRAVENOUS | Status: AC
  Administered 2020-08-22: 10 mg via INTRAVENOUS
  Filled 2020-08-22: qty 1

## 2020-08-22 MED ORDER — PALONOSETRON HCL INJECTION 0.25 MG/5ML
0.2500 mg | Freq: Once | INTRAVENOUS | Status: AC
  Administered 2020-08-22: 0.25 mg via INTRAVENOUS

## 2020-08-22 MED ORDER — CYANOCOBALAMIN 1000 MCG/ML IJ SOLN
INTRAMUSCULAR | Status: AC
  Filled 2020-08-22: qty 1

## 2020-08-22 MED ORDER — CYANOCOBALAMIN 1000 MCG/ML IJ SOLN
1000.0000 ug | Freq: Once | INTRAMUSCULAR | Status: AC
  Administered 2020-08-22: 1000 ug via INTRAMUSCULAR

## 2020-08-22 MED ORDER — PEMBROLIZUMAB CHEMO INJECTION 100 MG/4ML
200.0000 mg | Freq: Once | INTRAVENOUS | Status: AC
  Administered 2020-08-22: 200 mg via INTRAVENOUS
  Filled 2020-08-22: qty 8

## 2020-08-22 MED ORDER — SODIUM CHLORIDE 0.9 % IV SOLN
Freq: Once | INTRAVENOUS | Status: AC
  Filled 2020-08-22: qty 250

## 2020-08-22 MED ORDER — SODIUM CHLORIDE 0.9 % IV SOLN
500.0000 mg/m2 | Freq: Once | INTRAVENOUS | Status: AC
  Administered 2020-08-22: 1100 mg via INTRAVENOUS
  Filled 2020-08-22: qty 40

## 2020-08-22 NOTE — Addendum Note (Signed)
Addended by: Burney Gauze R on: 08/22/2020 10:08 AM   Modules accepted: Orders

## 2020-08-22 NOTE — Telephone Encounter (Signed)
Per los 08/22/20 - patient to receive schedule in tx room with AVS

## 2020-08-22 NOTE — Patient Instructions (Signed)
[facility] Discharge Instructions for Patients Receiving Chemotherapy  Today you received the following chemotherapy agents Keytruda/Alimta/Carboplatin  To help prevent nausea and vomiting after your treatment, we encourage you to take your nausea medication as prescribed.   If you develop nausea and vomiting that is not controlled by your nausea medication, call the clinic.   BELOW ARE SYMPTOMS THAT SHOULD BE REPORTED IMMEDIATELY:  *FEVER GREATER THAN 100.5 F  *CHILLS WITH OR WITHOUT FEVER  NAUSEA AND VOMITING THAT IS NOT CONTROLLED WITH YOUR NAUSEA MEDICATION  *UNUSUAL SHORTNESS OF BREATH  *UNUSUAL BRUISING OR BLEEDING  TENDERNESS IN MOUTH AND THROAT WITH OR WITHOUT PRESENCE OF ULCERS  *URINARY PROBLEMS  *BOWEL PROBLEMS  UNUSUAL RASH Items with * indicate a potential emergency and should be followed up as soon as possible.  Feel free to call the clinic should you have any questions or concerns. The clinic phone number is [phone].  Please show the [facility] at check-in to the Emergency Department and triage nurse.

## 2020-08-22 NOTE — Progress Notes (Signed)
Hematology and Oncology Follow Up Visit  John Hinton [id number] 1945/09/22 75 y.o. 08/22/2020   Principle Diagnosis:   Stage IVA (T3N2N0) adenocarcinoma of the LUL -- resected -- (+) KRAS  Current Therapy:    S/P cycle #2 of Carbo/Alimta/Pembrolizumab     Interim History:  [name] is back for follow-up.  Unfortunately, he had Covid.  He is not sure how he got this.  His family got this.  He had very mild symptoms.  He feels great right now.  He has had no problems with weakness.  There is no fatigue.  He has had no change in bowel or bladder habits.  There has been no nausea or vomiting.  He is last chemotherapy was probably a month or so ago.  We had to delay him because of the Covid.  He has had no pain.  He does have what might be a superficial thrombus in the distal right arm.  This is where he had an IV.  We may have to get a Doppler of this site.  He has had no rashes.  He has had no leg swelling.  Overall, his performance status is ECOG 0.    Medications:  Current Outpatient Medications:  .  aspirin 81 MG tablet, Take 81 mg by mouth at bedtime. , Disp: , Rfl:  .  cholecalciferol (VITAMIN D) 1000 UNITS tablet, Take 1,000 Units by mouth at bedtime. , Disp: , Rfl:  .  clopidogrel (PLAVIX) 75 MG tablet, Take 1 tablet (75 mg total) by mouth daily., Disp: 90 tablet, Rfl: 3 .  Cyanocobalamin (B-12 PO), Take 1,000 mcg by mouth daily. , Disp: , Rfl:  .  dexamethasone (DECADRON) 4 MG tablet, Take 1 tab two times a day the day before Alimta chemo, then take 2 tabs once a day for 3 days starting the day after cisplatin., Disp: 30 tablet, Rfl: 1 .  doxazosin (CARDURA) 2 MG tablet, Take 1 tablet (2 mg total) by mouth daily. (Patient taking differently: Take 2 mg by mouth at bedtime.), Disp: 90 tablet, Rfl: 2 .  ezetimibe (ZETIA) 10 MG tablet, Take 1 tablet (10 mg total) by mouth daily., Disp: 90 tablet, Rfl: 3 .  folic acid (FOLVITE) 1 MG tablet, Take 1 tablet (1 mg total) by  mouth daily. Start 5-7 days before Alimta chemotherapy. Continue until 21 days after Alimta completed., Disp: 100 tablet, Rfl: 3 .  folic acid (FOLVITE) 703 MCG tablet, Take 400 mcg by mouth daily., Disp: , Rfl:  .  Multiple Vitamin (MULTIVITAMIN) tablet, Take 1 tablet by mouth daily., Disp: , Rfl:  .  ramipril (ALTACE) 10 MG capsule, TAKE TWO CAPSULES BY MOUTH DAILY, Disp: 180 capsule, Rfl: 3 .  rosuvastatin (CRESTOR) 40 MG tablet, TAKE ONE TABLET BY MOUTH EVERY NIGHT AT BEDTIME, Disp: 90 tablet, Rfl: 2 .  sertraline (ZOLOFT) 50 MG tablet, Take 50 mg by mouth in the morning and at bedtime. , Disp: , Rfl:  .  spironolactone (ALDACTONE) 25 MG tablet, TAKE ONE TABLET BY MOUTH DAILY, Disp: 90 tablet, Rfl: 3 .  nitroGLYCERIN (NITROSTAT) 0.4 MG SL tablet, Place 1 tablet (0.4 mg total) under the tongue every 5 (five) minutes as needed for chest pain. (Patient not taking: No sig reported), Disp: 25 tablet, Rfl: 3 .  ondansetron (ZOFRAN) 8 MG tablet, Take 1 tablet (8 mg total) by mouth 2 (two) times daily as needed (Nausea or vomiting). Start if needed on the third day after cisplatin. (Patient not taking:  Reported on 08/22/2020), Disp: 30 tablet, Rfl: 1 .  prochlorperazine (COMPAZINE) 10 MG tablet, Take 1 tablet (10 mg total) by mouth every 6 (six) hours as needed (Nausea or vomiting). (Patient not taking: Reported on 08/22/2020), Disp: 30 tablet, Rfl: 1  Allergies:  Allergies  Allergen Reactions  . Amlodipine Swelling    Swelling of ankles    Past Medical History, Surgical history, Social history, and Family History were reviewed and updated.  Review of Systems: Review of Systems  Constitutional: Negative.   HENT:  Negative.   Eyes: Negative.   Respiratory: Negative.   Cardiovascular: Negative.   Gastrointestinal: Positive for constipation.  Endocrine: Negative.   Genitourinary: Negative.    Musculoskeletal: Negative.   Skin: Negative.   Neurological: Negative.   Hematological: Negative.    Psychiatric/Behavioral: Negative.     Physical Exam:  weight is 200 lb 12.8 oz (91.1 kg). His oral temperature is 98.5 F (36.9 C). His blood pressure is 135/87 and his pulse is 78. His respiration is 20 and oxygen saturation is 100%.   Wt Readings from Last 3 Encounters:  08/22/20 200 lb 12.8 oz (91.1 kg)  07/18/20 201 lb 12.8 oz (91.5 kg)  06/28/20 206 lb (93.4 kg)    Physical Exam Vitals reviewed.  HENT:     Head: Normocephalic and atraumatic.  Eyes:     Pupils: Pupils are equal, round, and reactive to light.  Cardiovascular:     Rate and Rhythm: Normal rate and regular rhythm.     Heart sounds: Normal heart sounds.  Pulmonary:     Effort: Pulmonary effort is normal.     Breath sounds: Normal breath sounds.  Abdominal:     General: Bowel sounds are normal.     Palpations: Abdomen is soft.  Musculoskeletal:        General: No tenderness or deformity. Normal range of motion.     Cervical back: Normal range of motion.  Lymphadenopathy:     Cervical: No cervical adenopathy.  Skin:    General: Skin is warm and dry.     Findings: No erythema or rash.  Neurological:     Mental Status: He is alert and oriented to person, place, and time.  Psychiatric:        Behavior: Behavior normal.        Thought Content: Thought content normal.        Judgment: Judgment normal.      Lab Results  Component Value Date   WBC 10.9 (H) 08/22/2020   HGB 11.8 (L) 08/22/2020   HCT 35.7 (L) 08/22/2020   MCV 94.7 08/22/2020   PLT 207 08/22/2020     Chemistry      Component Value Date/Time   NA 137 08/22/2020 0905   NA 141 02/05/2020 1425   K 3.9 08/22/2020 0905   CL 103 08/22/2020 0905   CO2 24 08/22/2020 0905   BUN 22 08/22/2020 0905   BUN 21 02/05/2020 1425   CREATININE 0.92 08/22/2020 0905   CREATININE 1.01 03/26/2016 1009      Component Value Date/Time   CALCIUM 10.0 08/22/2020 0905   ALKPHOS 69 08/22/2020 0905   AST 16 08/22/2020 0905   ALT 16 08/22/2020 0905    BILITOT 0.3 08/22/2020 0905      Impression and Plan: [name] is a very nice 75 year old white male.  He certainly looks a lot younger.  He has an atypical stage IV lung cancer which is disease has been resected.  We will proceed with his 3rd cycle of chemotherapy.  After this cycle, we will repeat her scans to see how everything looks.  I just do not believe that the delay in treatment is going to be a problem for him.  Again we really cannot treat him because of the Covid.  We will get him back on schedule.  We will have him come back in 3 weeks for his fourth cycle of treatment.    [name], MD 2/3/20229:49 AM

## 2020-08-22 NOTE — Progress Notes (Signed)
Patient here for cycle 3 after delay from covid infection. He is doing well with no complaints. Patient will need a PET scan prior to his next cycle.  PET scan scheduled. Appointment and PET instructions reviewed with patient. Radiology info sheet given to patient for education reinforcement.   Oncology Nurse Navigator Documentation  Oncology Nurse Navigator Flowsheets 08/22/2020  Abnormal Finding Date -  Confirmed Diagnosis Date -  Diagnosis Status -  Planned Course of Treatment -  Phase of Treatment -  Chemotherapy Actual Start Date: -  Surgery Actual Start Date: -  Navigator Follow Up Date: 09/02/2020  Navigator Follow Up Reason: Scan Review  Navigator Location [facility]  Referral Date to RadOnc/MedOnc -  Navigator Encounter Type Treatment  Telephone -  Treatment Initiated Date -  Patient Visit Type MedOnc  Treatment Phase Active Tx  Barriers/Navigation Needs Coordination of Care;Education  Education Other  Interventions Coordination of Care;Education;Psycho-Social Support  Acuity Level 2-Minimal Needs (1-2 Barriers Identified)  Coordination of Care Appts;Radiology  Education Method Verbal;Written  Support Groups/Services Friends and Family  Time Spent with Patient 64

## 2020-08-23 ENCOUNTER — Encounter: Payer: Self-pay | Admitting: *Deleted

## 2020-08-23 LAB — T4: T4, Total: 4.8 ug/dL (ref 4.5–12.0)

## 2020-08-23 NOTE — Progress Notes (Signed)
Received a call from the patient's wife. She had some questions about the patient's treatment plan and reason for PET. Reviewed his treatment plan with her, and explained that his PET is standard to determine response to treatment. She was grateful for the information.   Oncology Nurse Navigator Documentation  Oncology Nurse Navigator Flowsheets 08/23/2020  Abnormal Finding Date -  Confirmed Diagnosis Date -  Diagnosis Status -  Planned Course of Treatment -  Phase of Treatment -  Chemotherapy Actual Start Date: -  Surgery Actual Start Date: -  Navigator Follow Up Date: 09/02/2020  Navigator Follow Up Reason: Scan Review  Navigator Location [facility]  Referral Date to RadOnc/MedOnc -  Navigator Encounter Type Telephone  Telephone Patient Update;Incoming Call  Treatment Initiated Date -  Patient Visit Type MedOnc  Treatment Phase Active Tx  Barriers/Navigation Needs Coordination of Care;Education  Education Other  Interventions Education;Psycho-Social Support  Acuity Level 2-Minimal Needs (1-2 Barriers Identified)  Coordination of Care -  Education Method Verbal  Support Groups/Services Friends and Family  Time Spent with Patient 30

## 2020-08-27 ENCOUNTER — Encounter: Payer: Self-pay | Admitting: *Deleted

## 2020-08-27 NOTE — Progress Notes (Signed)
The patient's wife called to follow up from call last week. She states patient's mental state continues to decline. He has barely left his room in several days. Asked if she felt patient was suicidal and she stated 'no'. She is wanting Dr Marin Olp to call patient and see if he can assure him, that at this time, he is doing well with no obvious reason for concern at this time. I told her that I would give this update and request to Dr Marin Olp. She was appreciative.  Update and request shared with Dr Marin Olp. He will call at his earliest convenience.   Oncology Nurse Navigator Documentation  Oncology Nurse Navigator Flowsheets 08/27/2020  Abnormal Finding Date -  Confirmed Diagnosis Date -  Diagnosis Status -  Planned Course of Treatment -  Phase of Treatment -  Chemotherapy Actual Start Date: -  Surgery Actual Start Date: -  Navigator Follow Up Date: 09/02/2020  Navigator Follow Up Reason: Scan Review  Navigator Location [facility]  Referral Date to RadOnc/MedOnc -  Navigator Encounter Type Telephone  Telephone Patient Update  Treatment Initiated Date -  Patient Visit Type MedOnc  Treatment Phase Active Tx  Barriers/Navigation Needs Coordination of Care;Education  Education Other  Interventions Psycho-Social Support  Acuity Level 2-Minimal Needs (1-2 Barriers Identified)  Coordination of Care -  Education Method -  Support Groups/Services Friends and Family  Time Spent with Patient 30

## 2020-08-29 ENCOUNTER — Ambulatory Visit: Payer: Medicare Other

## 2020-08-29 ENCOUNTER — Ambulatory Visit: Payer: Medicare Other | Admitting: Hematology & Oncology

## 2020-08-29 ENCOUNTER — Other Ambulatory Visit: Payer: Medicare Other

## 2020-09-02 ENCOUNTER — Ambulatory Visit (HOSPITAL_COMMUNITY)
Admission: RE | Admit: 2020-09-02 | Discharge: 2020-09-02 | Disposition: A | Discharge status: Home / Self Care | Payer: Medicare Other | Source: Ambulatory Visit | Attending: Hematology & Oncology | Admitting: Hematology & Oncology

## 2020-09-02 ENCOUNTER — Other Ambulatory Visit: Payer: Self-pay

## 2020-09-02 ENCOUNTER — Encounter: Payer: Self-pay | Admitting: *Deleted

## 2020-09-02 DIAGNOSIS — C3412 Malignant neoplasm of upper lobe, left bronchus or lung: Secondary | ICD-10-CM | POA: Diagnosis not present

## 2020-09-02 DIAGNOSIS — C3492 Malignant neoplasm of unspecified part of left bronchus or lung: Secondary | ICD-10-CM

## 2020-09-02 DIAGNOSIS — C3490 Malignant neoplasm of unspecified part of unspecified bronchus or lung: Secondary | ICD-10-CM | POA: Diagnosis not present

## 2020-09-02 LAB — GLUCOSE, CAPILLARY: Glucose-Capillary: 104 mg/dL — ABNORMAL HIGH (ref 70–99)

## 2020-09-02 IMAGING — CT NM PET TUM IMG RESTAG (PS) SKULL BASE T - THIGH
7 series · 25 of 25 positions shown · non-contrast
Comparison: PET-CT [DATE]

CLINICAL DATA: Subsequent treatment strategy for non-small cell
lung cancer. Stage IV lung cancer. Biopsy [DATE]. Resection
[DATE]. Chemotherapy ongoing

EXAM:
NUCLEAR MEDICINE PET SKULL BASE TO THIGH
TECHNIQUE: 10.0 mCi F-18 FDG was injected intravenously. Full-ring PET imaging
was performed from the skull base to thigh after the radiotracer. CT
data was obtained and used for attenuation correction and anatomic
localization.
Fasting blood glucose: 104 mg/dl

[Series 3: pet sk_thigh ac · axial · 5.0mm · 4.07mm/px · z∈[-1014,-58]mm · 5 of 240 slices shown]
[im 1/240]
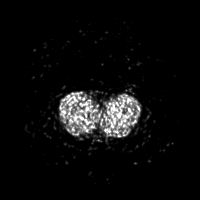
[im 60/240]
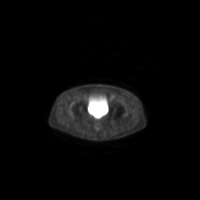
[im 120/240]
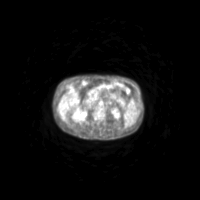
[im 180/240]
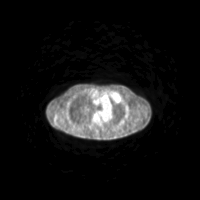
[im 240/240]
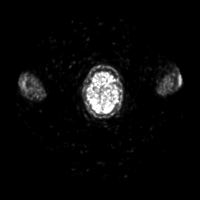

[Series 4: ct sk_thigh 5.0 bf37 · axial · 5.0mm · 0.98mm/px · z∈[-1014,-58]mm · 5 of 240 slices shown]
[im 1/240]
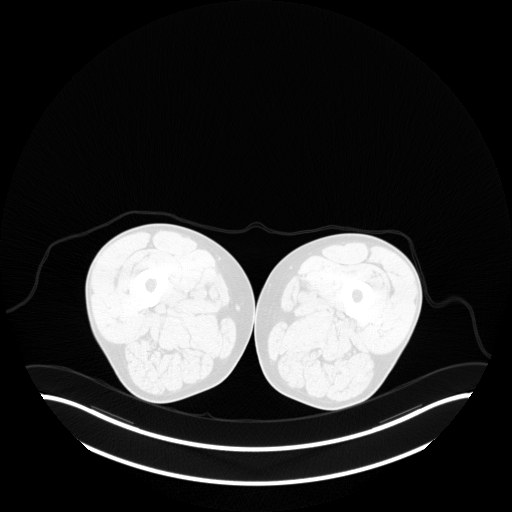
[im 60/240]
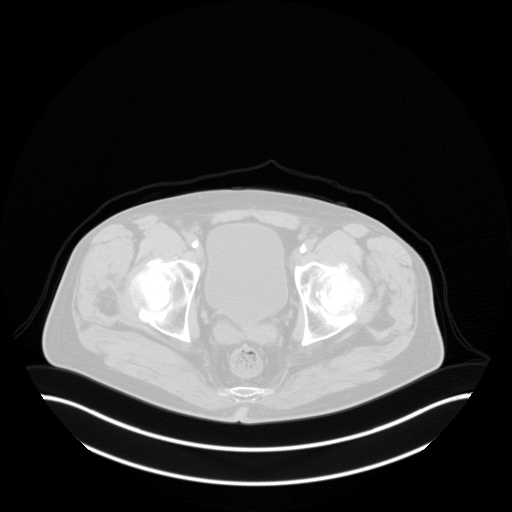
[im 120/240]
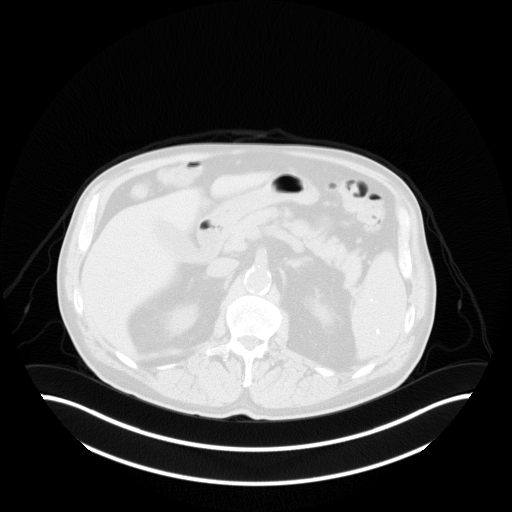
[im 180/240]
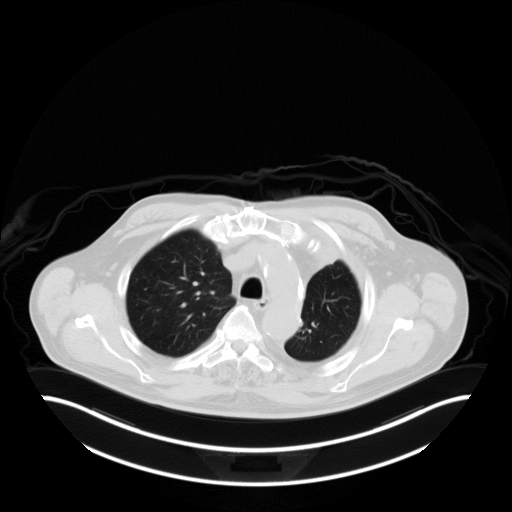
[im 240/240  brain]
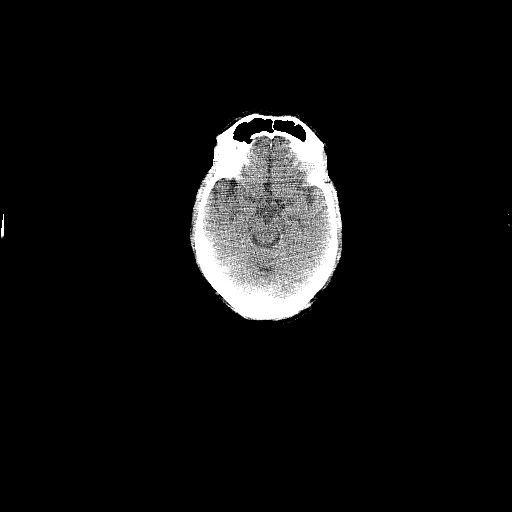

[Series 5: pet sk_thigh nac · axial · 5.0mm · 4.07mm/px · z∈[-1014,-58]mm · 6 of 240 slices shown]
[im 1/240]
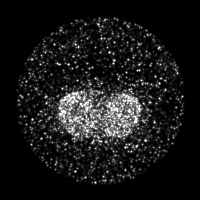
[im 48/240]
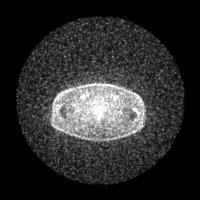
[im 96/240]
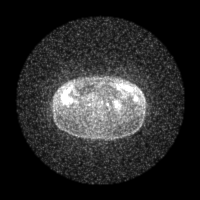
[im 144/240]
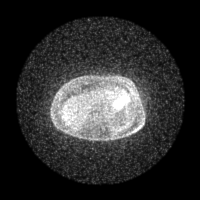
[im 192/240]
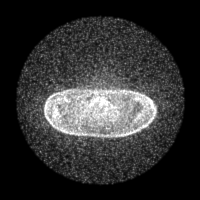
[im 240/240]
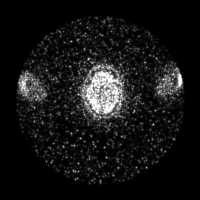

[Series 8: ct sk_thigh 5.0 (id) lung_bone · axial · 5.0mm · 0.70mm/px · z∈[-498,-218]mm · 2 of 71 slices shown]
[im 1/71]
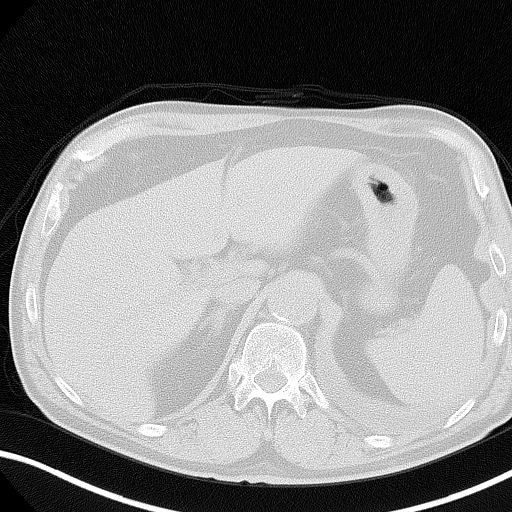
[im 71/71]
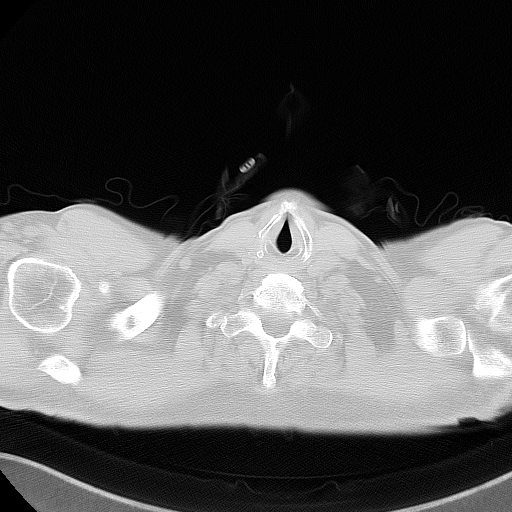

[Series 603: fused cor · 1 of 34 slices shown]
[im 1/34]
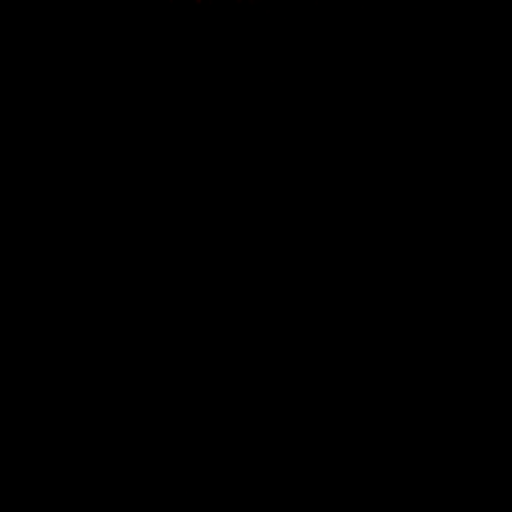

[Series 604: <mip collection> · coronal · 1.98mm/px · 1 of 32 slices shown]
[im 1/32]
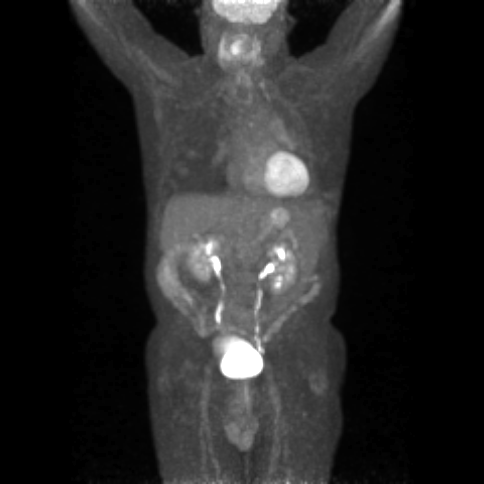

[Series 605: range-ct sk_thigh 5.0 bf37-tra-<alpha range> · 5 of 230 slices shown]
[im 1/230]
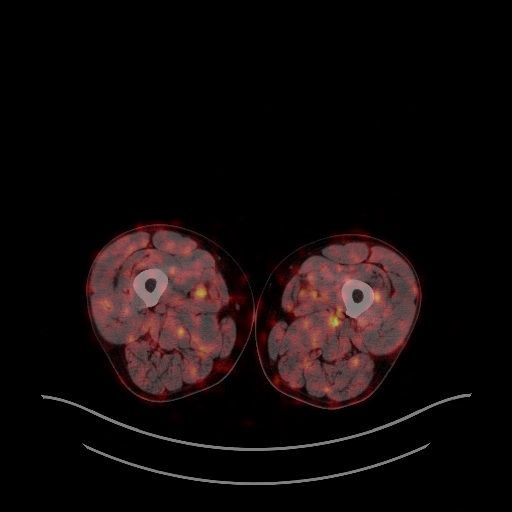
[im 58/230]
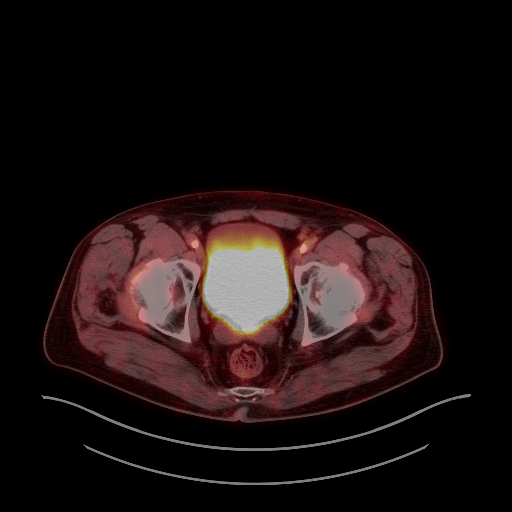
[im 115/230]
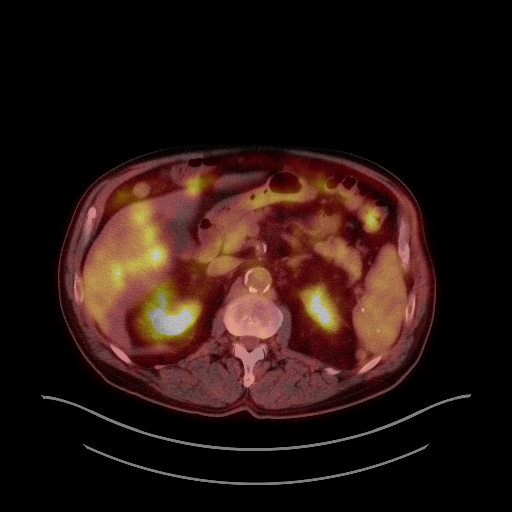
[im 172/230]
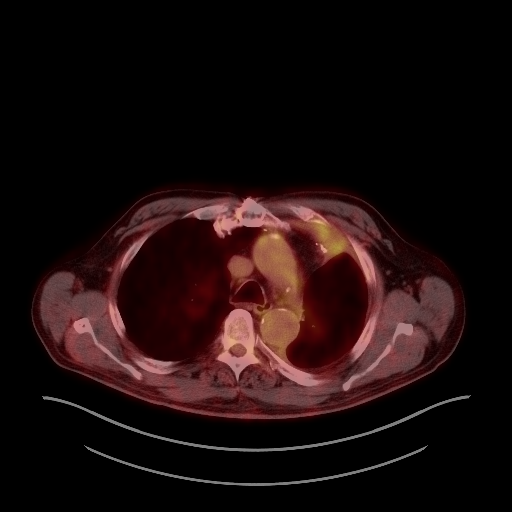
[im 230/230]
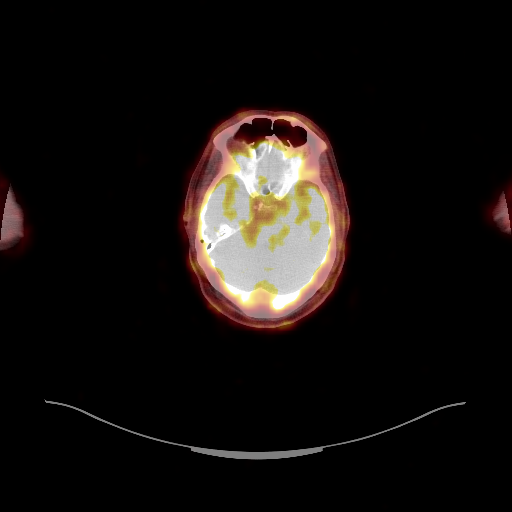

[25 of 25 positions shown; findings below may reference images not displayed]

FINDINGS: Mediastinal blood pool activity: SUV max

Liver activity: SUV max NA

NECK: New focal metabolic activity in the LEFT supraclavicular
region with SUV max equal 3.8 on image 43. Activity has a somewhat
linear pattern and appears to correspond to vascular structure. No
lymphadenopathy identified.

Incidental CT findings: none

CHEST: Post LEFT upper lobe wedge resection. No hypermetabolic in
nodularity within the LEFT lung or pleural space.

No hypermetabolic mediastinal lymph nodes. Small focus of metabolic
activity along the thoracic aortic arch is consistent with benign
atherosclerotic vascular inflammation.

No lesions in the RIGHT lung.

Incidental CT findings: none

ABDOMEN/PELVIS: No abnormal hypermetabolic activity within the
liver, pancreas, adrenal glands, or spleen. No hypermetabolic lymph
nodes in the abdomen or pelvis.

Incidental CT findings: none

SKELETON: No focal hypermetabolic activity to suggest skeletal
metastasis.

Incidental CT findings: none
IMPRESSION: 1. No evidence of local lung cancer recurrence following LEFT upper
lobe resection for stage IV (M1) metastatic disease.
2. No hypermetabolic mediastinal lymph nodes. No distant metastatic
disease.
3. New small focus of metabolic activity in the LEFT supraclavicular
region is favored benign vascular. Recommend attention on follow-up

## 2020-09-02 MED ORDER — FLUDEOXYGLUCOSE F - 18 (FDG) INJECTION
10.0000 | Freq: Once | INTRAVENOUS | Status: AC
  Administered 2020-09-02: 9.96 via INTRAVENOUS

## 2020-09-02 NOTE — Progress Notes (Signed)
Reviewed PET scan with Dr Marin Olp. Okay to call patient and review results with him. Patient given scan results.  Oncology Nurse Navigator Documentation  Oncology Nurse Navigator Flowsheets 09/02/2020  Abnormal Finding Date -  Confirmed Diagnosis Date -  Diagnosis Status -  Planned Course of Treatment -  Phase of Treatment -  Chemotherapy Actual Start Date: -  Surgery Actual Start Date: -  Navigator Follow Up Date: 09/13/2020  Navigator Follow Up Reason: Follow-up Appointment;Chemotherapy  Navigator Location [facility]  Referral Date to RadOnc/MedOnc -  Navigator Encounter Type Scan Review;Diagnostic Results;Telephone  Telephone Diagnostic Results;Outgoing Call  Treatment Initiated Date -  Patient Visit Type MedOnc  Treatment Phase Active Tx  Barriers/Navigation Needs Coordination of Care;Education  Education Other  Interventions Education;Psycho-Social Support  Acuity Level 2-Minimal Needs (1-2 Barriers Identified)  Coordination of Care -  Education Method Verbal  Support Groups/Services Friends and Family  Time Spent with Patient 30

## 2020-09-13 ENCOUNTER — Other Ambulatory Visit: Payer: Self-pay

## 2020-09-13 ENCOUNTER — Encounter: Payer: Self-pay | Admitting: *Deleted

## 2020-09-13 ENCOUNTER — Telehealth: Payer: Self-pay | Admitting: Hematology & Oncology

## 2020-09-13 ENCOUNTER — Inpatient Hospital Stay: Payer: Medicare Other

## 2020-09-13 ENCOUNTER — Encounter: Payer: Self-pay | Admitting: Hematology & Oncology

## 2020-09-13 ENCOUNTER — Inpatient Hospital Stay (HOSPITAL_BASED_OUTPATIENT_CLINIC_OR_DEPARTMENT_OTHER): Payer: Medicare Other | Admitting: Hematology & Oncology

## 2020-09-13 VITALS — BP 116/58 | HR 79 | Temp 97.8°F | Resp 18 | Wt 198.0 lb

## 2020-09-13 DIAGNOSIS — C3412 Malignant neoplasm of upper lobe, left bronchus or lung: Secondary | ICD-10-CM | POA: Diagnosis not present

## 2020-09-13 DIAGNOSIS — C3492 Malignant neoplasm of unspecified part of left bronchus or lung: Secondary | ICD-10-CM | POA: Diagnosis not present

## 2020-09-13 DIAGNOSIS — Z5111 Encounter for antineoplastic chemotherapy: Secondary | ICD-10-CM | POA: Diagnosis not present

## 2020-09-13 DIAGNOSIS — Z79899 Other long term (current) drug therapy: Secondary | ICD-10-CM | POA: Diagnosis not present

## 2020-09-13 DIAGNOSIS — Z5112 Encounter for antineoplastic immunotherapy: Secondary | ICD-10-CM | POA: Diagnosis not present

## 2020-09-13 LAB — CMP (CANCER CENTER ONLY)
ALT: 19 U/L (ref 0–44)
AST: 18 U/L (ref 15–41)
Albumin: 4.5 g/dL (ref 3.5–5.0)
Alkaline Phosphatase: 70 U/L (ref 38–126)
Anion gap: 9 (ref 5–15)
BUN: 20 mg/dL (ref 8–23)
CO2: 26 mmol/L (ref 22–32)
Calcium: 10.4 mg/dL — ABNORMAL HIGH (ref 8.9–10.3)
Chloride: 102 mmol/L (ref 98–111)
Creatinine: 0.86 mg/dL (ref 0.61–1.24)
GFR, Estimated: >60 mL/min (ref >60)
Glucose, Bld: 125 mg/dL — ABNORMAL HIGH (ref 70–99)
Potassium: 3.9 mmol/L (ref 3.5–5.1)
Sodium: 137 mmol/L (ref 135–145)
Total Bilirubin: 0.3 mg/dL (ref 0.3–1.2)
Total Protein: 7.5 g/dL (ref 6.5–8.1)

## 2020-09-13 LAB — CBC WITH DIFFERENTIAL (CANCER CENTER ONLY)
Abs Immature Granulocytes: 0.02 10*3/uL (ref 0.00–0.07)
Basophils Absolute: 0 10*3/uL (ref 0.0–0.1)
Basophils Relative: 0 %
Eosinophils Absolute: 0 10*3/uL (ref 0.0–0.5)
Eosinophils Relative: 0 %
HCT: 32.4 % — ABNORMAL LOW (ref 39.0–52.0)
Hemoglobin: 10.8 g/dL — ABNORMAL LOW (ref 13.0–17.0)
Immature Granulocytes: 0 %
Lymphocytes Relative: 12 %
Lymphs Abs: 0.8 10*3/uL (ref 0.7–4.0)
MCH: 31.1 pg (ref 26.0–34.0)
MCHC: 33.3 g/dL (ref 30.0–36.0)
MCV: 93.4 fL (ref 80.0–100.0)
Monocytes Absolute: 0.8 10*3/uL (ref 0.1–1.0)
Monocytes Relative: 12 %
Neutro Abs: 4.8 10*3/uL (ref 1.7–7.7)
Neutrophils Relative %: 76 %
Platelet Count: 300 10*3/uL (ref 150–400)
RBC: 3.47 MIL/uL — ABNORMAL LOW (ref 4.22–5.81)
RDW: 14.8 % (ref 11.5–15.5)
WBC Count: 6.4 10*3/uL (ref 4.0–10.5)
nRBC: 0 % (ref 0.0–0.2)

## 2020-09-13 LAB — LACTATE DEHYDROGENASE: LDH: 136 U/L (ref 98–192)

## 2020-09-13 LAB — TSH: TSH: 1.049 u[IU]/mL (ref 0.320–4.118)

## 2020-09-13 MED ORDER — PALONOSETRON HCL INJECTION 0.25 MG/5ML
INTRAVENOUS | Status: AC
  Filled 2020-09-13: qty 5

## 2020-09-13 MED ORDER — SODIUM CHLORIDE 0.9 % IV SOLN
150.0000 mg | Freq: Once | INTRAVENOUS | Status: AC
  Administered 2020-09-13: 150 mg via INTRAVENOUS
  Filled 2020-09-13: qty 150

## 2020-09-13 MED ORDER — SODIUM CHLORIDE 0.9 % IV SOLN
Freq: Once | INTRAVENOUS | Status: AC
  Filled 2020-09-13: qty 250

## 2020-09-13 MED ORDER — SODIUM CHLORIDE 0.9 % IV SOLN
555.5000 mg | Freq: Once | INTRAVENOUS | Status: AC
  Administered 2020-09-13: 560 mg via INTRAVENOUS
  Filled 2020-09-13: qty 56

## 2020-09-13 MED ORDER — DEXAMETHASONE 4 MG PO TABS: ORAL_TABLET | ORAL | 1 refills | Status: DC

## 2020-09-13 MED ORDER — PALONOSETRON HCL INJECTION 0.25 MG/5ML
0.2500 mg | Freq: Once | INTRAVENOUS | Status: AC
  Administered 2020-09-13: 0.25 mg via INTRAVENOUS

## 2020-09-13 MED ORDER — SODIUM CHLORIDE 0.9 % IV SOLN
200.0000 mg | Freq: Once | INTRAVENOUS | Status: AC
  Administered 2020-09-13: 200 mg via INTRAVENOUS
  Filled 2020-09-13: qty 8

## 2020-09-13 MED ORDER — SODIUM CHLORIDE 0.9 % IV SOLN
10.0000 mg | Freq: Once | INTRAVENOUS | Status: AC
  Administered 2020-09-13: 10 mg via INTRAVENOUS
  Filled 2020-09-13: qty 10

## 2020-09-13 MED ORDER — PEMETREXED DISODIUM CHEMO INJECTION 500 MG
500.0000 mg/m2 | Freq: Once | INTRAVENOUS | Status: AC
  Administered 2020-09-13: 1100 mg via INTRAVENOUS
  Filled 2020-09-13: qty 40

## 2020-09-13 NOTE — Patient Instructions (Signed)
[facility] Discharge Instructions for Patients Receiving Chemotherapy  Today you received the following chemotherapy agents Keytruda, Alimta, Carboplatin  To help prevent nausea and vomiting after your treatment, we encourage you to take your nausea medication as prescribed by MD. **DO NOT TAKE ZOFRAN FOR 3 DAYS AFTER CHEMOTHERAPY**   If you develop nausea and vomiting that is not controlled by your nausea medication, call the clinic.   BELOW ARE SYMPTOMS THAT SHOULD BE REPORTED IMMEDIATELY:  *FEVER GREATER THAN 100.5 F  *CHILLS WITH OR WITHOUT FEVER  NAUSEA AND VOMITING THAT IS NOT CONTROLLED WITH YOUR NAUSEA MEDICATION  *UNUSUAL SHORTNESS OF BREATH  *UNUSUAL BRUISING OR BLEEDING  TENDERNESS IN MOUTH AND THROAT WITH OR WITHOUT PRESENCE OF ULCERS  *URINARY PROBLEMS  *BOWEL PROBLEMS  UNUSUAL RASH Items with * indicate a potential emergency and should be followed up as soon as possible.  Feel free to call the clinic should you have any questions or concerns. The clinic phone number is [phone].  Please show the [facility] at check-in to the Emergency Department and triage nurse.

## 2020-09-13 NOTE — Progress Notes (Signed)
Oncology Nurse Navigator Documentation  Oncology Nurse Navigator Flowsheets 09/13/2020  Abnormal Finding Date -  Confirmed Diagnosis Date -  Diagnosis Status -  Planned Course of Treatment -  Phase of Treatment -  Chemotherapy Actual Start Date: -  Surgery Actual Start Date: -  Navigator Follow Up Date: 10/04/2020  Navigator Follow Up Reason: Follow-up Appointment;Chemotherapy  Navigator Location [facility]  Referral Date to RadOnc/MedOnc -  Navigator Encounter Type Treatment;Appt/Treatment Plan Review  Telephone -  Treatment Initiated Date -  Patient Visit Type MedOnc  Treatment Phase Active Tx  Barriers/Navigation Needs Coordination of Care;Education  Education -  Interventions Psycho-Social Support  Acuity Level 2-Minimal Needs (1-2 Barriers Identified)  Coordination of Care -  Education Method -  Support Groups/Services Friends and Family  Time Spent with Patient 30

## 2020-09-13 NOTE — Telephone Encounter (Signed)
Appointments scheduled patient will get updated schedule from My Chart per 2/25 los

## 2020-09-13 NOTE — Progress Notes (Signed)
Hematology and Oncology Follow Up Visit  John Hinton [id number] 11-02-1945 75 y.o. 09/13/2020   Principle Diagnosis:   Stage IVA (T3N2N0) adenocarcinoma of the LUL -- resected -- (+) KRAS  Current Therapy:    S/P cycle #3 of Carbo/Alimta/Pembrolizumab     Interim History:  John Hinton is back for follow-up.  He really looks fantastic.  He has done so nicely with treatment.  He has been very active.  We did go ahead and do a PET scan on him.  This is done on 214 07/27/2020.  The PET scan showed no evidence of metastatic or local recurrence of the lung cancer.  There was a focus of activity in the left supraclavicular region which was felt to be vascular.  He has had no problems with hair loss.  He has had no change in bowel or bladder habits.  He has had no leg swelling.  We also did do a Doppler of his right arm.  This only showed some thrombophlebitis.  He has had no bleeding.  He has had no headache.  His appetite has been good.  He has had a little bit of nausea but no vomiting.  Overall, his performance status right now is ECOG 0.    Medications:  Current Outpatient Medications:  .  aspirin 81 MG tablet, Take 81 mg by mouth at bedtime. , Disp: , Rfl:  .  cholecalciferol (VITAMIN D) 1000 UNITS tablet, Take 1,000 Units by mouth at bedtime. , Disp: , Rfl:  .  clopidogrel (PLAVIX) 75 MG tablet, Take 1 tablet (75 mg total) by mouth daily., Disp: 90 tablet, Rfl: 3 .  Cyanocobalamin (B-12 PO), Take 1,000 mcg by mouth daily. , Disp: , Rfl:  .  dexamethasone (DECADRON) 4 MG tablet, Take 1 tab two times a day the day before Alimta chemo, then take 2 tabs once a day for 3 days starting the day after cisplatin., Disp: 30 tablet, Rfl: 1 .  doxazosin (CARDURA) 2 MG tablet, Take 1 tablet (2 mg total) by mouth daily. (Patient taking differently: Take 2 mg by mouth at bedtime.), Disp: 90 tablet, Rfl: 2 .  ezetimibe (ZETIA) 10 MG tablet, Take 1 tablet (10 mg total) by mouth daily., Disp: 90  tablet, Rfl: 3 .  folic acid (FOLVITE) 1 MG tablet, Take 1 tablet (1 mg total) by mouth daily. Start 5-7 days before Alimta chemotherapy. Continue until 21 days after Alimta completed., Disp: 100 tablet, Rfl: 3 .  folic acid (FOLVITE) 803 MCG tablet, Take 400 mcg by mouth daily., Disp: , Rfl:  .  Multiple Vitamin (MULTIVITAMIN) tablet, Take 1 tablet by mouth daily., Disp: , Rfl:  .  nitroGLYCERIN (NITROSTAT) 0.4 MG SL tablet, Place 1 tablet (0.4 mg total) under the tongue every 5 (five) minutes as needed for chest pain. (Patient not taking: No sig reported), Disp: 25 tablet, Rfl: 3 .  ondansetron (ZOFRAN) 8 MG tablet, Take 1 tablet (8 mg total) by mouth 2 (two) times daily as needed (Nausea or vomiting). Start if needed on the third day after cisplatin. (Patient not taking: Reported on 08/22/2020), Disp: 30 tablet, Rfl: 1 .  prochlorperazine (COMPAZINE) 10 MG tablet, Take 1 tablet (10 mg total) by mouth every 6 (six) hours as needed (Nausea or vomiting). (Patient not taking: Reported on 08/22/2020), Disp: 30 tablet, Rfl: 1 .  ramipril (ALTACE) 10 MG capsule, TAKE TWO CAPSULES BY MOUTH DAILY, Disp: 180 capsule, Rfl: 3 .  rosuvastatin (CRESTOR) 40 MG tablet, TAKE ONE  TABLET BY MOUTH EVERY NIGHT AT BEDTIME, Disp: 90 tablet, Rfl: 2 .  sertraline (ZOLOFT) 50 MG tablet, Take 50 mg by mouth in the morning and at bedtime. , Disp: , Rfl:  .  spironolactone (ALDACTONE) 25 MG tablet, TAKE ONE TABLET BY MOUTH DAILY, Disp: 90 tablet, Rfl: 3  Allergies:  Allergies  Allergen Reactions  . Amlodipine Swelling    Swelling of ankles    Past Medical History, Surgical history, Social history, and Family History were reviewed and updated.  Review of Systems: Review of Systems  Constitutional: Negative.   HENT:  Negative.   Eyes: Negative.   Respiratory: Negative.   Cardiovascular: Negative.   Gastrointestinal: Positive for constipation.  Endocrine: Negative.   Genitourinary: Negative.    Musculoskeletal:  Negative.   Skin: Negative.   Neurological: Negative.   Hematological: Negative.   Psychiatric/Behavioral: Negative.     Physical Exam:  vitals were not taken for this visit.   Wt Readings from Last 3 Encounters:  08/22/20 200 lb 12.8 oz (91.1 kg)  07/18/20 201 lb 12.8 oz (91.5 kg)  06/28/20 206 lb (93.4 kg)    Physical Exam Vitals reviewed.  HENT:     Head: Normocephalic and atraumatic.  Eyes:     Pupils: Pupils are equal, round, and reactive to light.  Cardiovascular:     Rate and Rhythm: Normal rate and regular rhythm.     Heart sounds: Normal heart sounds.  Pulmonary:     Effort: Pulmonary effort is normal.     Breath sounds: Normal breath sounds.  Abdominal:     General: Bowel sounds are normal.     Palpations: Abdomen is soft.  Musculoskeletal:        General: No tenderness or deformity. Normal range of motion.     Cervical back: Normal range of motion.  Lymphadenopathy:     Cervical: No cervical adenopathy.  Skin:    General: Skin is warm and dry.     Findings: No erythema or rash.  Neurological:     Mental Status: He is alert and oriented to person, place, and time.  Psychiatric:        Behavior: Behavior normal.        Thought Content: Thought content normal.        Judgment: Judgment normal.      Lab Results  Component Value Date   WBC 6.4 09/13/2020   HGB 10.8 (L) 09/13/2020   HCT 32.4 (L) 09/13/2020   MCV 93.4 09/13/2020   PLT 300 09/13/2020     Chemistry      Component Value Date/Time   NA 137 08/22/2020 0905   NA 141 02/05/2020 1425   K 3.9 08/22/2020 0905   CL 103 08/22/2020 0905   CO2 24 08/22/2020 0905   BUN 22 08/22/2020 0905   BUN 21 02/05/2020 1425   CREATININE 0.92 08/22/2020 0905   CREATININE 1.01 03/26/2016 1009      Component Value Date/Time   CALCIUM 10.0 08/22/2020 0905   ALKPHOS 69 08/22/2020 0905   AST 16 08/22/2020 0905   ALT 16 08/22/2020 0905   BILITOT 0.3 08/22/2020 0905      Impression and Plan: [name] is a very nice 75 year old white male.  He certainly looks a lot younger.  He has an atypical stage IV lung cancer which is disease has been resected.  We will proceed with his 4th cycle of chemotherapy.    I would like to get him back  in 3 weeks for his fifth cycle of treatment.  I would like to do 6 cycles of chemotherapy and then plan for maintenance therapy with pembrolizumab.   [name], MD 2/25/20228:59 AM

## 2020-09-14 LAB — T4: T4, Total: 4.5 ug/dL (ref 4.5–12.0)

## 2020-09-19 ENCOUNTER — Ambulatory Visit: Payer: Medicare Other | Admitting: Hematology & Oncology

## 2020-09-19 ENCOUNTER — Other Ambulatory Visit: Payer: Medicare Other

## 2020-09-19 ENCOUNTER — Ambulatory Visit: Payer: Medicare Other

## 2020-10-04 ENCOUNTER — Inpatient Hospital Stay: Payer: Medicare Other | Attending: Hematology & Oncology

## 2020-10-04 ENCOUNTER — Telehealth: Payer: Self-pay | Admitting: Family

## 2020-10-04 ENCOUNTER — Encounter: Payer: Self-pay | Admitting: *Deleted

## 2020-10-04 ENCOUNTER — Other Ambulatory Visit: Payer: Self-pay

## 2020-10-04 ENCOUNTER — Inpatient Hospital Stay (HOSPITAL_BASED_OUTPATIENT_CLINIC_OR_DEPARTMENT_OTHER): Payer: Medicare Other | Admitting: Family

## 2020-10-04 ENCOUNTER — Inpatient Hospital Stay: Payer: Medicare Other

## 2020-10-04 VITALS — BP 138/65 | HR 80 | Temp 97.9°F | Resp 17 | Ht 71.0 in | Wt 204.0 lb

## 2020-10-04 DIAGNOSIS — E032 Hypothyroidism due to medicaments and other exogenous substances: Secondary | ICD-10-CM | POA: Diagnosis not present

## 2020-10-04 DIAGNOSIS — Z5112 Encounter for antineoplastic immunotherapy: Secondary | ICD-10-CM | POA: Diagnosis not present

## 2020-10-04 DIAGNOSIS — Z5111 Encounter for antineoplastic chemotherapy: Secondary | ICD-10-CM | POA: Insufficient documentation

## 2020-10-04 DIAGNOSIS — C3492 Malignant neoplasm of unspecified part of left bronchus or lung: Secondary | ICD-10-CM

## 2020-10-04 DIAGNOSIS — K5900 Constipation, unspecified: Secondary | ICD-10-CM | POA: Diagnosis not present

## 2020-10-04 DIAGNOSIS — C3412 Malignant neoplasm of upper lobe, left bronchus or lung: Secondary | ICD-10-CM | POA: Diagnosis not present

## 2020-10-04 DIAGNOSIS — R0602 Shortness of breath: Secondary | ICD-10-CM | POA: Insufficient documentation

## 2020-10-04 DIAGNOSIS — Z7982 Long term (current) use of aspirin: Secondary | ICD-10-CM | POA: Insufficient documentation

## 2020-10-04 DIAGNOSIS — Z79899 Other long term (current) drug therapy: Secondary | ICD-10-CM | POA: Insufficient documentation

## 2020-10-04 DIAGNOSIS — D509 Iron deficiency anemia, unspecified: Secondary | ICD-10-CM

## 2020-10-04 LAB — CMP (CANCER CENTER ONLY)
ALT: 16 U/L (ref 0–44)
AST: 15 U/L (ref 15–41)
Albumin: 4.4 g/dL (ref 3.5–5.0)
Alkaline Phosphatase: 72 U/L (ref 38–126)
Anion gap: 9 (ref 5–15)
BUN: 19 mg/dL (ref 8–23)
CO2: 26 mmol/L (ref 22–32)
Calcium: 9.5 mg/dL (ref 8.9–10.3)
Chloride: 106 mmol/L (ref 98–111)
Creatinine: 0.87 mg/dL (ref 0.61–1.24)
GFR, Estimated: >60 mL/min (ref >60)
Glucose, Bld: 106 mg/dL — ABNORMAL HIGH (ref 70–99)
Potassium: 3.7 mmol/L (ref 3.5–5.1)
Sodium: 141 mmol/L (ref 135–145)
Total Bilirubin: 0.3 mg/dL (ref 0.3–1.2)
Total Protein: 7.2 g/dL (ref 6.5–8.1)

## 2020-10-04 LAB — CBC WITH DIFFERENTIAL (CANCER CENTER ONLY)
Abs Immature Granulocytes: 0.02 10*3/uL (ref 0.00–0.07)
Basophils Absolute: 0 10*3/uL (ref 0.0–0.1)
Basophils Relative: 0 %
Eosinophils Absolute: 0 10*3/uL (ref 0.0–0.5)
Eosinophils Relative: 0 %
HCT: 30.8 % — ABNORMAL LOW (ref 39.0–52.0)
Hemoglobin: 10 g/dL — ABNORMAL LOW (ref 13.0–17.0)
Immature Granulocytes: 0 %
Lymphocytes Relative: 10 %
Lymphs Abs: 0.7 10*3/uL (ref 0.7–4.0)
MCH: 31.4 pg (ref 26.0–34.0)
MCHC: 32.5 g/dL (ref 30.0–36.0)
MCV: 96.9 fL (ref 80.0–100.0)
Monocytes Absolute: 0.7 10*3/uL (ref 0.1–1.0)
Monocytes Relative: 10 %
Neutro Abs: 5.6 10*3/uL (ref 1.7–7.7)
Neutrophils Relative %: 80 %
Platelet Count: 214 10*3/uL (ref 150–400)
RBC: 3.18 MIL/uL — ABNORMAL LOW (ref 4.22–5.81)
RDW: 16.3 % — ABNORMAL HIGH (ref 11.5–15.5)
WBC Count: 6.9 10*3/uL (ref 4.0–10.5)
nRBC: 0 % (ref 0.0–0.2)

## 2020-10-04 LAB — TSH: TSH: 1.179 u[IU]/mL (ref 0.320–4.118)

## 2020-10-04 MED ORDER — SODIUM CHLORIDE 0.9 % IV SOLN
10.0000 mg | Freq: Once | INTRAVENOUS | Status: AC
  Administered 2020-10-04: 10 mg via INTRAVENOUS
  Filled 2020-10-04: qty 10

## 2020-10-04 MED ORDER — PALONOSETRON HCL INJECTION 0.25 MG/5ML
INTRAVENOUS | Status: AC
  Filled 2020-10-04: qty 5

## 2020-10-04 MED ORDER — PALONOSETRON HCL INJECTION 0.25 MG/5ML
0.2500 mg | Freq: Once | INTRAVENOUS | Status: AC
  Administered 2020-10-04: 0.25 mg via INTRAVENOUS

## 2020-10-04 MED ORDER — SODIUM CHLORIDE 0.9 % IV SOLN
Freq: Once | INTRAVENOUS | Status: AC
  Filled 2020-10-04: qty 250

## 2020-10-04 MED ORDER — CARBOPLATIN CHEMO INJECTION 600 MG/60ML
555.5000 mg | Freq: Once | INTRAVENOUS | Status: AC
Start: 2020-10-04 — End: 2020-10-04
  Administered 2020-10-04: 560 mg via INTRAVENOUS
  Filled 2020-10-04: qty 56

## 2020-10-04 MED ORDER — SODIUM CHLORIDE 0.9 % IV SOLN
200.0000 mg | Freq: Once | INTRAVENOUS | Status: AC
  Administered 2020-10-04: 200 mg via INTRAVENOUS
  Filled 2020-10-04: qty 8

## 2020-10-04 MED ORDER — SODIUM CHLORIDE 0.9 % IV SOLN
150.0000 mg | Freq: Once | INTRAVENOUS | Status: AC
  Administered 2020-10-04: 150 mg via INTRAVENOUS
  Filled 2020-10-04: qty 150

## 2020-10-04 MED ORDER — SODIUM CHLORIDE 0.9 % IV SOLN
500.0000 mg/m2 | Freq: Once | INTRAVENOUS | Status: AC
  Administered 2020-10-04: 1100 mg via INTRAVENOUS
  Filled 2020-10-04: qty 40

## 2020-10-04 NOTE — Patient Instructions (Signed)
Pembrolizumab injection What is this medicine? PEMBROLIZUMAB (pem broe liz ue mab) is a monoclonal antibody. It is used to treat certain types of cancer. This medicine may be used for other purposes; ask your health care provider or pharmacist if you have questions. COMMON BRAND NAME(S): Keytruda What should I tell my health care provider before I take this medicine? They need to know if you have any of these conditions:  autoimmune diseases like Crohn's disease, ulcerative colitis, or lupus  have had or planning to have an allogeneic stem cell transplant (uses someone else's stem cells)  history of organ transplant  history of chest radiation  nervous system problems like myasthenia gravis or Guillain-Barre syndrome  an unusual or allergic reaction to pembrolizumab, other medicines, foods, dyes, or preservatives  pregnant or trying to get pregnant  breast-feeding How should I use this medicine? This medicine is for infusion into a vein. It is given by a health care professional in a hospital or clinic setting. A special MedGuide will be given to you before each treatment. Be sure to read this information carefully each time. Talk to your pediatrician regarding the use of this medicine in children. While this drug may be prescribed for children as young as 6 months for selected conditions, precautions do apply. Overdosage: If you think you have taken too much of this medicine contact a poison control center or emergency room at once. NOTE: This medicine is only for you. Do not share this medicine with others. What if I miss a dose? It is important not to miss your dose. Call your doctor or health care professional if you are unable to keep an appointment. What may interact with this medicine? Interactions have not been studied. This list may not describe all possible interactions. Give your health care provider a list of all the medicines, herbs, non-prescription drugs, or dietary  supplements you use. Also tell them if you smoke, drink alcohol, or use illegal drugs. Some items may interact with your medicine. What should I watch for while using this medicine? Your condition will be monitored carefully while you are receiving this medicine. You may need blood work done while you are taking this medicine. Do not become pregnant while taking this medicine or for 4 months after stopping it. Women should inform their doctor if they wish to become pregnant or think they might be pregnant. There is a potential for serious side effects to an unborn child. Talk to your health care professional or pharmacist for more information. Do not breast-feed an infant while taking this medicine or for 4 months after the last dose. What side effects may I notice from receiving this medicine? Side effects that you should report to your doctor or health care professional as soon as possible:  allergic reactions like skin rash, itching or hives, swelling of the face, lips, or tongue  bloody or black, tarry  breathing problems  changes in vision  chest pain  chills  confusion  constipation  cough  diarrhea  dizziness or feeling faint or lightheaded  fast or irregular heartbeat  fever  flushing  joint pain  low blood counts - this medicine may decrease the number of white blood cells, red blood cells and platelets. You may be at increased risk for infections and bleeding.  muscle pain  muscle weakness  pain, tingling, numbness in the hands or feet  persistent headache  redness, blistering, peeling or loosening of the skin, including inside the mouth  signs and  symptoms of high blood sugar such as dizziness; dry mouth; dry skin; fruity breath; nausea; stomach pain; increased hunger or thirst; increased urination  signs and symptoms of kidney injury like trouble passing urine or change in the amount of urine  signs and symptoms of liver injury like dark urine,  light-colored stools, loss of appetite, nausea, right upper belly pain, yellowing of the eyes or skin  sweating  swollen lymph nodes  weight loss Side effects that usually do not require medical attention (report to your doctor or health care professional if they continue or are bothersome):  decreased appetite  hair loss  tiredness This list may not describe all possible side effects. Call your doctor for medical advice about side effects. You may report side effects to FDA at 1-800-FDA-1088. Where should I keep my medicine? This drug is given in a hospital or clinic and will not be stored at home. NOTE: This sheet is a summary. It may not cover all possible information. If you have questions about this medicine, talk to your doctor, pharmacist, or health care provider.  2021 Elsevier/Gold Standard (2019-06-07 21:44:53) Carboplatin injection What is this medicine? CARBOPLATIN (KAR boe pla tin) is a chemotherapy drug. It targets fast dividing cells, like cancer cells, and causes these cells to die. This medicine is used to treat ovarian cancer and many other cancers. This medicine may be used for other purposes; ask your health care provider or pharmacist if you have questions. COMMON BRAND NAME(S): Paraplatin What should I tell my health care provider before I take this medicine? They need to know if you have any of these conditions:  blood disorders  hearing problems  kidney disease  recent or ongoing radiation therapy  an unusual or allergic reaction to carboplatin, cisplatin, other chemotherapy, other medicines, foods, dyes, or preservatives  pregnant or trying to get pregnant  breast-feeding How should I use this medicine? This drug is usually given as an infusion into a vein. It is administered in a hospital or clinic by a specially trained health care professional. Talk to your pediatrician regarding the use of this medicine in children. Special care may be  needed. Overdosage: If you think you have taken too much of this medicine contact a poison control center or emergency room at once. NOTE: This medicine is only for you. Do not share this medicine with others. What if I miss a dose? It is important not to miss a dose. Call your doctor or health care professional if you are unable to keep an appointment. What may interact with this medicine?  medicines for seizures  medicines to increase blood counts like filgrastim, pegfilgrastim, sargramostim  some antibiotics like amikacin, gentamicin, neomycin, streptomycin, tobramycin  vaccines Talk to your doctor or health care professional before taking any of these medicines:  acetaminophen  aspirin  ibuprofen  ketoprofen  naproxen This list may not describe all possible interactions. Give your health care provider a list of all the medicines, herbs, non-prescription drugs, or dietary supplements you use. Also tell them if you smoke, drink alcohol, or use illegal drugs. Some items may interact with your medicine. What should I watch for while using this medicine? Your condition will be monitored carefully while you are receiving this medicine. You will need important blood work done while you are taking this medicine. This drug may make you feel generally unwell. This is not uncommon, as chemotherapy can affect healthy cells as well as cancer cells. Report any side effects. Continue your  course of treatment even though you feel ill unless your doctor tells you to stop. In some cases, you may be given additional medicines to help with side effects. Follow all directions for their use. Call your doctor or health care professional for advice if you get a fever, chills or sore throat, or other symptoms of a cold or flu. Do not treat yourself. This drug decreases your body's ability to fight infections. Try to avoid being around people who are sick. This medicine may increase your risk to bruise or  bleed. Call your doctor or health care professional if you notice any unusual bleeding. Be careful brushing and flossing your teeth or using a toothpick because you may get an infection or bleed more easily. If you have any dental work done, tell your dentist you are receiving this medicine. Avoid taking products that contain aspirin, acetaminophen, ibuprofen, naproxen, or ketoprofen unless instructed by your doctor. These medicines may hide a fever. Do not become pregnant while taking this medicine. Women should inform their doctor if they wish to become pregnant or think they might be pregnant. There is a potential for serious side effects to an unborn child. Talk to your health care professional or pharmacist for more information. Do not breast-feed an infant while taking this medicine. What side effects may I notice from receiving this medicine? Side effects that you should report to your doctor or health care professional as soon as possible:  allergic reactions like skin rash, itching or hives, swelling of the face, lips, or tongue  signs of infection - fever or chills, cough, sore throat, pain or difficulty passing urine  signs of decreased platelets or bleeding - bruising, pinpoint red spots on the skin, black, tarry stools, nosebleeds  signs of decreased red blood cells - unusually weak or tired, fainting spells, lightheadedness  breathing problems  changes in hearing  changes in vision  chest pain  high blood pressure  low blood counts - This drug may decrease the number of white blood cells, red blood cells and platelets. You may be at increased risk for infections and bleeding.  nausea and vomiting  pain, swelling, redness or irritation at the injection site  pain, tingling, numbness in the hands or feet  problems with balance, talking, walking  trouble passing urine or change in the amount of urine Side effects that usually do not require medical attention (report to  your doctor or health care professional if they continue or are bothersome):  hair loss  loss of appetite  metallic taste in the mouth or changes in taste This list may not describe all possible side effects. Call your doctor for medical advice about side effects. You may report side effects to FDA at 1-800-FDA-1088. Where should I keep my medicine? This drug is given in a hospital or clinic and will not be stored at home. NOTE: This sheet is a summary. It may not cover all possible information. If you have questions about this medicine, talk to your doctor, pharmacist, or health care provider.  2021 Elsevier/Gold Standard (2007-10-11 14:38:05) Pemetrexed injection What is this medicine? PEMETREXED (PEM e TREX ed) is a chemotherapy drug used to treat lung cancers like non-small cell lung cancer and mesothelioma. It may also be used to treat other cancers. This medicine may be used for other purposes; ask your health care provider or pharmacist if you have questions. COMMON BRAND NAME(S): Alimta What should I tell my health care provider before I take this medicine?  They need to know if you have any of these conditions:  infection (especially a virus infection such as chickenpox, cold sores, or herpes)  kidney disease  low blood counts, like low white cell, platelet, or red cell counts  lung or breathing disease, like asthma  radiation therapy  an unusual or allergic reaction to pemetrexed, other medicines, foods, dyes, or preservative  pregnant or trying to get pregnant  breast-feeding How should I use this medicine? This drug is given as an infusion into a vein. It is administered in a hospital or clinic by a specially trained health care professional. Talk to your pediatrician regarding the use of this medicine in children. Special care may be needed. Overdosage: If you think you have taken too much of this medicine contact a poison control center or emergency room at  once. NOTE: This medicine is only for you. Do not share this medicine with others. What if I miss a dose? It is important not to miss your dose. Call your doctor or health care professional if you are unable to keep an appointment. What may interact with this medicine? This medicine may interact with the following medications:  Ibuprofen This list may not describe all possible interactions. Give your health care provider a list of all the medicines, herbs, non-prescription drugs, or dietary supplements you use. Also tell them if you smoke, drink alcohol, or use illegal drugs. Some items may interact with your medicine. What should I watch for while using this medicine? Visit your doctor for checks on your progress. This drug may make you feel generally unwell. This is not uncommon, as chemotherapy can affect healthy cells as well as cancer cells. Report any side effects. Continue your course of treatment even though you feel ill unless your doctor tells you to stop. In some cases, you may be given additional medicines to help with side effects. Follow all directions for their use. Call your doctor or health care professional for advice if you get a fever, chills or sore throat, or other symptoms of a cold or flu. Do not treat yourself. This drug decreases your body's ability to fight infections. Try to avoid being around people who are sick. This medicine may increase your risk to bruise or bleed. Call your doctor or health care professional if you notice any unusual bleeding. Be careful brushing and flossing your teeth or using a toothpick because you may get an infection or bleed more easily. If you have any dental work done, tell your dentist you are receiving this medicine. Avoid taking products that contain aspirin, acetaminophen, ibuprofen, naproxen, or ketoprofen unless instructed by your doctor. These medicines may hide a fever. Call your doctor or health care professional if you get diarrhea  or mouth sores. Do not treat yourself. To protect your kidneys, drink water or other fluids as directed while you are taking this medicine. Do not become pregnant while taking this medicine or for 6 months after stopping it. Women should inform their doctor if they wish to become pregnant or think they might be pregnant. Men should not father a child while taking this medicine and for 3 months after stopping it. This may interfere with the ability to father a child. You should talk to your doctor or health care professional if you are concerned about your fertility. There is a potential for serious side effects to an unborn child. Talk to your health care professional or pharmacist for more information. Do not breast-feed an infant while  taking this medicine or for 1 week after stopping it. What side effects may I notice from receiving this medicine? Side effects that you should report to your doctor or health care professional as soon as possible:  allergic reactions like skin rash, itching or hives, swelling of the face, lips, or tongue  breathing problems  redness, blistering, peeling or loosening of the skin, including inside the mouth  signs and symptoms of bleeding such as bloody or black, tarry stools; red or dark-brown urine; spitting up blood or brown material that looks like coffee grounds; red spots on the skin; unusual bruising or bleeding from the eye, gums, or nose  signs and symptoms of infection like fever or chills; cough; sore throat; pain or trouble passing urine  signs and symptoms of kidney injury like trouble passing urine or change in the amount of urine  signs and symptoms of liver injury like dark yellow or brown urine; general ill feeling or flu-like symptoms; light-colored stools; loss of appetite; nausea; right upper belly pain; unusually weak or tired; yellowing of the eyes or skin Side effects that usually do not require medical attention (report to your doctor or  health care professional if they continue or are bothersome):  constipation  mouth sores  nausea, vomiting  unusually weak or tired This list may not describe all possible side effects. Call your doctor for medical advice about side effects. You may report side effects to FDA at 1-800-FDA-1088. Where should I keep my medicine? This drug is given in a hospital or clinic and will not be stored at home. NOTE: This sheet is a summary. It may not cover all possible information. If you have questions about this medicine, talk to your doctor, pharmacist, or health care provider.  2021 Elsevier/Gold Standard (2017-08-25 16:11:33)

## 2020-10-04 NOTE — Progress Notes (Addendum)
Hematology and Oncology Follow Up Visit  John Hinton [id number] [birth year] 75 y.o. 10/04/2020   Principle Diagnosis:  Stage IVA (T3N2N0) adenocarcinoma of the LUL -- resected -- (+) KRAS  Current Therapy:        Carbo/Alimta/Pembrolizumab s/p cycle 4/6   Interim History:  John Hinton is here today for follow-up and treatment. He is doing quite well. He denies fatigue. TSH and T4 so far have remained stable. Today's results are pending.  He states that his mild SOB with exertion (stairs) is stable/unchanged.  He will have fatigue for about 3.5 days after each treatment.  Since he has done so well after each cycle, he would like to decrease his decadron to 1 tablet each day for the 3 days after each chemo. That will be fine.  He has occasional lightheadedness when he stands too quickly. He states that this is not new and he mostly notes when golfing. He has not been able to golf for a while due to the cold weather.  No fever, chills, n/v, cough, rash, dizziness, SOB, chest pain, palpitationsor changes in bladder habits. He has constipation and some abdominal bloating after treatment.  No blood loss noted. No abnormal bruising, no petechiae.   No swelling, tenderness, numbness or tingling in his extremities at this time.  No falls or syncope to report.   ECOG Performance Status: 1 - Symptomatic but completely ambulatory  Medications:  Allergies as of 10/04/2020      Reactions   Amlodipine Swelling   Swelling of ankles      Medication List       Accurate as of October 04, 2020 10:14 AM. If you have any questions, ask your nurse or doctor.        aspirin 81 MG tablet Take 81 mg by mouth at bedtime.   B-12 PO Take 1,000 mcg by mouth daily.   cholecalciferol 1000 units tablet Commonly known as: VITAMIN D Take 1,000 Units by mouth at bedtime.   clopidogrel 75 MG tablet Commonly known as: PLAVIX Take 1 tablet (75 mg total) by mouth daily.   dexamethasone 4 MG  tablet Commonly known as: DECADRON Take 1 tab two times a day the day before Alimta chemo, then take 2 tabs once a day for 3 days starting the day after cisplatin.   doxazosin 2 MG tablet Commonly known as: CARDURA Take 1 tablet (2 mg total) by mouth daily. What changed: when to take this   ezetimibe 10 MG tablet Commonly known as: ZETIA Take 1 tablet (10 mg total) by mouth daily.   folic acid 1 MG tablet Commonly known as: FOLVITE Take 1 tablet (1 mg total) by mouth daily. Start 5-7 days before Alimta chemotherapy. Continue until 21 days after Alimta completed.   folic acid 086 MCG tablet Commonly known as: FOLVITE Take 400 mcg by mouth daily.   multivitamin tablet Take 1 tablet by mouth daily.   nitroGLYCERIN 0.4 MG SL tablet Commonly known as: NITROSTAT Place 1 tablet (0.4 mg total) under the tongue every 5 (five) minutes as needed for chest pain.   ondansetron 8 MG tablet Commonly known as: Zofran Take 1 tablet (8 mg total) by mouth 2 (two) times daily as needed (Nausea or vomiting). Start if needed on the third day after cisplatin.   prochlorperazine 10 MG tablet Commonly known as: COMPAZINE Take 1 tablet (10 mg total) by mouth every 6 (six) hours as needed (Nausea or vomiting).   ramipril 10 MG capsule Commonly  known as: ALTACE TAKE TWO CAPSULES BY MOUTH DAILY   rosuvastatin 40 MG tablet Commonly known as: CRESTOR TAKE ONE TABLET BY MOUTH EVERY NIGHT AT BEDTIME   sertraline 50 MG tablet Commonly known as: ZOLOFT Take 50 mg by mouth in the morning and at bedtime.   spironolactone 25 MG tablet Commonly known as: ALDACTONE TAKE ONE TABLET BY MOUTH DAILY       Allergies:  Allergies  Allergen Reactions  . Amlodipine Swelling    Swelling of ankles    Past Medical History, Surgical history, Social history, and Family History were reviewed and updated.  Review of Systems: All other 10 point review of systems is negative.   Physical Exam:  vitals were  not taken for this visit.   Wt Readings from Last 3 Encounters:  09/13/20 198 lb (89.8 kg)  08/22/20 200 lb 12.8 oz (91.1 kg)  07/18/20 201 lb 12.8 oz (91.5 kg)    Ocular: Sclerae unicteric, pupils equal, round and reactive to light Ear-nose-throat: Oropharynx clear, dentition fair Lymphatic: No cervical, supraclavicular or axillary adenopathy Lungs no rales or rhonchi, good excursion bilaterally Heart regular rate and rhythm, no murmur appreciated Abd soft, nontender, positive bowel sounds, no liver or spleen tip palpated on exam, no fluid wave  MSK no focal spinal tenderness, no joint edema Neuro: non-focal, well-oriented, appropriate affect Breasts: Deferred   Lab Results  Component Value Date   WBC 6.9 10/04/2020   HGB 10.0 (L) 10/04/2020   HCT 30.8 (L) 10/04/2020   MCV 96.9 10/04/2020   PLT 214 10/04/2020   No results found for: FERRITIN, IRON, TIBC, UIBC, IRONPCTSAT Lab Results  Component Value Date   RBC 3.18 (L) 10/04/2020   No results found for: KPAFRELGTCHN, LAMBDASER, KAPLAMBRATIO No results found for: IGGSERUM, IGA, IGMSERUM No results found for: John Hinton, SPEI   Chemistry      Component Value Date/Time   NA 137 09/13/2020 0836   NA 141 02/05/2020 1425   K 3.9 09/13/2020 0836   CL 102 09/13/2020 0836   CO2 26 09/13/2020 0836   BUN 20 09/13/2020 0836   BUN 21 02/05/2020 1425   CREATININE 0.86 09/13/2020 0836   CREATININE 1.01 03/26/2016 1009      Component Value Date/Time   CALCIUM 10.4 (H) 09/13/2020 0836   ALKPHOS 70 09/13/2020 0836   AST 18 09/13/2020 0836   ALT 19 09/13/2020 0836   BILITOT 0.3 09/13/2020 0836       Impression and Plan: John Hinton is a very pleasant 75 yo Caucasian gentleman with an atypical stave IV lung cancer with disease having been resected.  He is doing well and will proceed with cycle 5 today as planned.  He will reduce his decadron to 1 tablet daily these next  3 days following chemo.  His plan is for a total of 6 treatments followed by maintenance Keytruda.  We will plan to repeat a PET scan once he completes cycle 6.  Follow-up in 3 weeks.  He can contact our office with any questions or concerns.   Laverna Peace, NP 3/18/202210:14 AM

## 2020-10-04 NOTE — Progress Notes (Signed)
Oncology Nurse Navigator Documentation  Oncology Nurse Navigator Flowsheets 10/04/2020  Abnormal Finding Date -  Confirmed Diagnosis Date -  Diagnosis Status -  Planned Course of Treatment -  Phase of Treatment -  Chemotherapy Actual Start Date: -  Surgery Actual Start Date: -  Navigator Follow Up Date: 10/25/2020  Navigator Follow Up Reason: Follow-up Appointment;Chemotherapy  Navigator Location [facility]  Referral Date to RadOnc/MedOnc -  Navigator Encounter Type Treatment;Appt/Treatment Plan Review  Telephone -  Treatment Initiated Date -  Patient Visit Type MedOnc  Treatment Phase Active Tx  Barriers/Navigation Needs Coordination of Care;Education  Education -  Interventions Psycho-Social Support  Acuity Level 2-Minimal Needs (1-2 Barriers Identified)  Coordination of Care -  Education Method -  Support Groups/Services Friends and Family  Time Spent with Patient 30

## 2020-10-04 NOTE — Telephone Encounter (Signed)
Appointments were previously scheduled as requested by 3/18 los patient is aware

## 2020-10-05 LAB — T4: T4, Total: 4.6 ug/dL (ref 4.5–12.0)

## 2020-10-07 DIAGNOSIS — L819 Disorder of pigmentation, unspecified: Secondary | ICD-10-CM | POA: Diagnosis not present

## 2020-10-07 DIAGNOSIS — D229 Melanocytic nevi, unspecified: Secondary | ICD-10-CM | POA: Diagnosis not present

## 2020-10-07 DIAGNOSIS — L821 Other seborrheic keratosis: Secondary | ICD-10-CM | POA: Diagnosis not present

## 2020-10-07 DIAGNOSIS — L814 Other melanin hyperpigmentation: Secondary | ICD-10-CM | POA: Diagnosis not present

## 2020-10-08 DIAGNOSIS — F40228 Other natural environment type phobia: Secondary | ICD-10-CM | POA: Diagnosis not present

## 2020-10-08 DIAGNOSIS — F411 Generalized anxiety disorder: Secondary | ICD-10-CM | POA: Diagnosis not present

## 2020-10-08 DIAGNOSIS — F4323 Adjustment disorder with mixed anxiety and depressed mood: Secondary | ICD-10-CM | POA: Diagnosis not present

## 2020-10-10 ENCOUNTER — Ambulatory Visit: Payer: Medicare Other

## 2020-10-10 ENCOUNTER — Ambulatory Visit: Payer: Medicare Other | Admitting: Hematology & Oncology

## 2020-10-10 ENCOUNTER — Other Ambulatory Visit: Payer: Medicare Other

## 2020-10-25 ENCOUNTER — Encounter: Payer: Self-pay | Admitting: *Deleted

## 2020-10-25 ENCOUNTER — Inpatient Hospital Stay (HOSPITAL_BASED_OUTPATIENT_CLINIC_OR_DEPARTMENT_OTHER): Payer: Medicare Other | Admitting: Hematology & Oncology

## 2020-10-25 ENCOUNTER — Other Ambulatory Visit: Payer: Self-pay

## 2020-10-25 ENCOUNTER — Inpatient Hospital Stay: Payer: Medicare Other | Attending: Hematology & Oncology

## 2020-10-25 ENCOUNTER — Inpatient Hospital Stay: Payer: Medicare Other

## 2020-10-25 ENCOUNTER — Encounter: Payer: Self-pay | Admitting: Hematology & Oncology

## 2020-10-25 ENCOUNTER — Telehealth: Payer: Self-pay

## 2020-10-25 VITALS — BP 148/75 | HR 91 | Temp 98.4°F | Resp 20 | Wt 204.5 lb

## 2020-10-25 DIAGNOSIS — Z5112 Encounter for antineoplastic immunotherapy: Secondary | ICD-10-CM | POA: Diagnosis not present

## 2020-10-25 DIAGNOSIS — C3492 Malignant neoplasm of unspecified part of left bronchus or lung: Secondary | ICD-10-CM | POA: Diagnosis not present

## 2020-10-25 DIAGNOSIS — E032 Hypothyroidism due to medicaments and other exogenous substances: Secondary | ICD-10-CM

## 2020-10-25 DIAGNOSIS — Z5111 Encounter for antineoplastic chemotherapy: Secondary | ICD-10-CM | POA: Insufficient documentation

## 2020-10-25 DIAGNOSIS — C3412 Malignant neoplasm of upper lobe, left bronchus or lung: Secondary | ICD-10-CM | POA: Insufficient documentation

## 2020-10-25 DIAGNOSIS — Z79899 Other long term (current) drug therapy: Secondary | ICD-10-CM | POA: Diagnosis not present

## 2020-10-25 DIAGNOSIS — D509 Iron deficiency anemia, unspecified: Secondary | ICD-10-CM

## 2020-10-25 LAB — CMP (CANCER CENTER ONLY)
ALT: 16 U/L (ref 0–44)
AST: 17 U/L (ref 15–41)
Albumin: 4.3 g/dL (ref 3.5–5.0)
Alkaline Phosphatase: 83 U/L (ref 38–126)
Anion gap: 12 (ref 5–15)
BUN: 25 mg/dL — ABNORMAL HIGH (ref 8–23)
CO2: 24 mmol/L (ref 22–32)
Calcium: 9.4 mg/dL (ref 8.9–10.3)
Chloride: 103 mmol/L (ref 98–111)
Creatinine: 0.88 mg/dL (ref 0.61–1.24)
GFR, Estimated: >60 mL/min (ref >60)
Glucose, Bld: 137 mg/dL — ABNORMAL HIGH (ref 70–99)
Potassium: 3.9 mmol/L (ref 3.5–5.1)
Sodium: 139 mmol/L (ref 135–145)
Total Bilirubin: 0.2 mg/dL — ABNORMAL LOW (ref 0.3–1.2)
Total Protein: 7.2 g/dL (ref 6.5–8.1)

## 2020-10-25 LAB — CBC WITH DIFFERENTIAL (CANCER CENTER ONLY)
Abs Immature Granulocytes: 0.04 10*3/uL (ref 0.00–0.07)
Basophils Absolute: 0 10*3/uL (ref 0.0–0.1)
Basophils Relative: 0 %
Eosinophils Absolute: 0 10*3/uL (ref 0.0–0.5)
Eosinophils Relative: 0 %
HCT: 29.3 % — ABNORMAL LOW (ref 39.0–52.0)
Hemoglobin: 9.5 g/dL — ABNORMAL LOW (ref 13.0–17.0)
Immature Granulocytes: 0 %
Lymphocytes Relative: 7 %
Lymphs Abs: 0.6 10*3/uL — ABNORMAL LOW (ref 0.7–4.0)
MCH: 31.9 pg (ref 26.0–34.0)
MCHC: 32.4 g/dL (ref 30.0–36.0)
MCV: 98.3 fL (ref 80.0–100.0)
Monocytes Absolute: 0.6 10*3/uL (ref 0.1–1.0)
Monocytes Relative: 6 %
Neutro Abs: 7.7 10*3/uL (ref 1.7–7.7)
Neutrophils Relative %: 87 %
Platelet Count: 250 10*3/uL (ref 150–400)
RBC: 2.98 MIL/uL — ABNORMAL LOW (ref 4.22–5.81)
RDW: 16.3 % — ABNORMAL HIGH (ref 11.5–15.5)
WBC Count: 9 10*3/uL (ref 4.0–10.5)
nRBC: 0 % (ref 0.0–0.2)

## 2020-10-25 LAB — TSH: TSH: 0.984 u[IU]/mL (ref 0.320–4.118)

## 2020-10-25 LAB — FERRITIN: Ferritin: 758 ng/mL — ABNORMAL HIGH (ref 24–336)

## 2020-10-25 LAB — IRON AND TIBC
Iron: 69 ug/dL (ref 42–163)
Saturation Ratios: 22 % (ref 20–55)
TIBC: 321 ug/dL (ref 202–409)
UIBC: 252 ug/dL (ref 117–376)

## 2020-10-25 MED ORDER — SODIUM CHLORIDE 0.9 % IV SOLN
500.0000 mg/m2 | Freq: Once | INTRAVENOUS | Status: AC
  Administered 2020-10-25: 1100 mg via INTRAVENOUS
  Filled 2020-10-25: qty 4

## 2020-10-25 MED ORDER — PALONOSETRON HCL INJECTION 0.25 MG/5ML
0.2500 mg | Freq: Once | INTRAVENOUS | Status: AC
  Administered 2020-10-25: 0.25 mg via INTRAVENOUS

## 2020-10-25 MED ORDER — SODIUM CHLORIDE 0.9 % IV SOLN
555.5000 mg | Freq: Once | INTRAVENOUS | Status: AC
  Administered 2020-10-25: 560 mg via INTRAVENOUS
  Filled 2020-10-25: qty 56

## 2020-10-25 MED ORDER — SODIUM CHLORIDE 0.9 % IV SOLN
Freq: Once | INTRAVENOUS | Status: AC
  Filled 2020-10-25: qty 250

## 2020-10-25 MED ORDER — SODIUM CHLORIDE 0.9 % IV SOLN
10.0000 mg | Freq: Once | INTRAVENOUS | Status: AC
  Administered 2020-10-25: 10 mg via INTRAVENOUS
  Filled 2020-10-25: qty 10

## 2020-10-25 MED ORDER — CYANOCOBALAMIN 1000 MCG/ML IJ SOLN
1000.0000 ug | Freq: Once | INTRAMUSCULAR | Status: AC
  Administered 2020-10-25: 1000 ug via INTRAMUSCULAR

## 2020-10-25 MED ORDER — CYANOCOBALAMIN 1000 MCG/ML IJ SOLN
INTRAMUSCULAR | Status: AC
  Filled 2020-10-25: qty 1

## 2020-10-25 MED ORDER — PALONOSETRON HCL INJECTION 0.25 MG/5ML
INTRAVENOUS | Status: AC
  Filled 2020-10-25: qty 5

## 2020-10-25 MED ORDER — SODIUM CHLORIDE 0.9 % IV SOLN
200.0000 mg | Freq: Once | INTRAVENOUS | Status: AC
  Administered 2020-10-25: 200 mg via INTRAVENOUS
  Filled 2020-10-25: qty 8

## 2020-10-25 MED ORDER — SODIUM CHLORIDE 0.9 % IV SOLN
150.0000 mg | Freq: Once | INTRAVENOUS | Status: AC
  Administered 2020-10-25: 150 mg via INTRAVENOUS
  Filled 2020-10-25: qty 150

## 2020-10-25 NOTE — Progress Notes (Signed)
Hematology and Oncology Follow Up Visit  John Hinton [id number] [birth year] 75 y.o. 10/25/2020   Principle Diagnosis:   Stage IVA (T3N2N0) adenocarcinoma of the LUL -- resected -- (+) KRAS  Current Therapy:    S/P cycle #5 of Carbo/Alimta/Pembrolizumab     Interim History:  John Hinton is back for follow-up.  He really looks fantastic.  He has done so nicely with treatment.  He has been very active.  He does have some shortness of breath.  I think this is probably from the anemia.  His hemoglobin has dropped slowly but surely with recent cycle of chemotherapy.  He has had no cough.  There is no hemoptysis.  There is no fever.  He has had no nausea or vomiting.  He says that about 4 to 5 days after treatment, that he has a lot of arthralgias and myalgias.  There is been no problems with diarrhea or constipation.  He really has not lost much in the way of his hair.  I am very impressed with this.  He has had no problems with headache.  Overall, his performance status is ECOG 1.   Medications:  Current Outpatient Medications:  .  aspirin 81 MG tablet, Take 81 mg by mouth at bedtime. , Disp: , Rfl:  .  cholecalciferol (VITAMIN D) 1000 UNITS tablet, Take 1,000 Units by mouth at bedtime. , Disp: , Rfl:  .  clopidogrel (PLAVIX) 75 MG tablet, Take 1 tablet (75 mg total) by mouth daily., Disp: 90 tablet, Rfl: 3 .  Cyanocobalamin (B-12 PO), Take 1,000 mcg by mouth daily. , Disp: , Rfl:  .  dexamethasone (DECADRON) 4 MG tablet, Take 1 tab two times a day the day before Alimta chemo, then take 2 tabs once a day for 3 days starting the day after cisplatin., Disp: 30 tablet, Rfl: 1 .  doxazosin (CARDURA) 2 MG tablet, Take 1 tablet (2 mg total) by mouth daily. (Patient taking differently: Take 2 mg by mouth at bedtime.), Disp: 90 tablet, Rfl: 2 .  ezetimibe (ZETIA) 10 MG tablet, Take 1 tablet (10 mg total) by mouth daily., Disp: 90 tablet, Rfl: 3 .  folic acid (FOLVITE) 1 MG tablet, Take  1 tablet (1 mg total) by mouth daily. Start 5-7 days before Alimta chemotherapy. Continue until 21 days after Alimta completed., Disp: 100 tablet, Rfl: 3 .  folic acid (FOLVITE) 481 MCG tablet, Take 400 mcg by mouth daily., Disp: , Rfl:  .  Multiple Vitamin (MULTIVITAMIN) tablet, Take 1 tablet by mouth daily., Disp: , Rfl:  .  nitroGLYCERIN (NITROSTAT) 0.4 MG SL tablet, Place 1 tablet (0.4 mg total) under the tongue every 5 (five) minutes as needed for chest pain. (Patient not taking: No sig reported), Disp: 25 tablet, Rfl: 3 .  ondansetron (ZOFRAN) 8 MG tablet, Take 1 tablet (8 mg total) by mouth 2 (two) times daily as needed (Nausea or vomiting). Start if needed on the third day after cisplatin. (Patient not taking: Reported on 08/22/2020), Disp: 30 tablet, Rfl: 1 .  prochlorperazine (COMPAZINE) 10 MG tablet, Take 1 tablet (10 mg total) by mouth every 6 (six) hours as needed (Nausea or vomiting). (Patient not taking: Reported on 08/22/2020), Disp: 30 tablet, Rfl: 1 .  ramipril (ALTACE) 10 MG capsule, TAKE TWO CAPSULES BY MOUTH DAILY, Disp: 180 capsule, Rfl: 3 .  rosuvastatin (CRESTOR) 40 MG tablet, TAKE ONE TABLET BY MOUTH EVERY NIGHT AT BEDTIME, Disp: 90 tablet, Rfl: 2 .  sertraline (ZOLOFT) 50 MG  tablet, Take 50 mg by mouth in the morning and at bedtime. , Disp: , Rfl:  .  spironolactone (ALDACTONE) 25 MG tablet, TAKE ONE TABLET BY MOUTH DAILY, Disp: 90 tablet, Rfl: 3  Allergies:  Allergies  Allergen Reactions  . Amlodipine Swelling    Swelling of ankles    Past Medical History, Surgical history, Social history, and Family History were reviewed and updated.  Review of Systems: Review of Systems  Constitutional: Negative.   HENT:  Negative.   Eyes: Negative.   Respiratory: Negative.   Cardiovascular: Negative.   Gastrointestinal: Positive for constipation.  Endocrine: Negative.   Genitourinary: Negative.    Musculoskeletal: Negative.   Skin: Negative.   Neurological: Negative.    Hematological: Negative.   Psychiatric/Behavioral: Negative.     Physical Exam:  weight is 204 lb 8 oz (92.8 kg). His oral temperature is 98.4 F (36.9 C). His blood pressure is 148/75 (abnormal) and his pulse is 91. His respiration is 20 and oxygen saturation is 100%.   Wt Readings from Last 3 Encounters:  10/25/20 204 lb 8 oz (92.8 kg)  10/04/20 204 lb (92.5 kg)  09/13/20 198 lb (89.8 kg)    Physical Exam Vitals reviewed.  HENT:     Head: Normocephalic and atraumatic.  Eyes:     Pupils: Pupils are equal, round, and reactive to light.  Cardiovascular:     Rate and Rhythm: Normal rate and regular rhythm.     Heart sounds: Normal heart sounds.  Pulmonary:     Effort: Pulmonary effort is normal.     Breath sounds: Normal breath sounds.  Abdominal:     General: Bowel sounds are normal.     Palpations: Abdomen is soft.  Musculoskeletal:        General: No tenderness or deformity. Normal range of motion.     Cervical back: Normal range of motion.  Lymphadenopathy:     Cervical: No cervical adenopathy.  Skin:    General: Skin is warm and dry.     Findings: No erythema or rash.  Neurological:     Mental Status: He is alert and oriented to person, place, and time.  Psychiatric:        Behavior: Behavior normal.        Thought Content: Thought content normal.        Judgment: Judgment normal.      Lab Results  Component Value Date   WBC 9.0 10/25/2020   HGB 9.5 (L) 10/25/2020   HCT 29.3 (L) 10/25/2020   MCV 98.3 10/25/2020   PLT 250 10/25/2020     Chemistry      Component Value Date/Time   NA 141 10/04/2020 0945   NA 141 02/05/2020 1425   K 3.7 10/04/2020 0945   CL 106 10/04/2020 0945   CO2 26 10/04/2020 0945   BUN 19 10/04/2020 0945   BUN 21 02/05/2020 1425   CREATININE 0.87 10/04/2020 0945   CREATININE 1.01 03/26/2016 1009      Component Value Date/Time   CALCIUM 9.5 10/04/2020 0945   ALKPHOS 72 10/04/2020 0945   AST 15 10/04/2020 0945   ALT 16  10/04/2020 0945   BILITOT 0.3 10/04/2020 0945      Impression and Plan: John Hinton is a very nice 75 year old white male.  He certainly looks a lot younger.  He has an atypical stage IV lung cancer which is disease has been resected.  We will proceed with his sixth and final cycle of  chemotherapy.  After this cycle, we will then plan for a follow-up PET scan on him.  If everything looks fine, which I think it will, we will then plan for maintenance therapy with Alimta/pembrolizumab.    [name], MD 4/8/20228:38 AM

## 2020-10-25 NOTE — Progress Notes (Signed)
PET scan scheduled. Patient given radiology info sheet and all information reviewed regarding appointment.   Oncology Nurse Navigator Documentation  Oncology Nurse Navigator Flowsheets 10/25/2020  Abnormal Finding Date -  Confirmed Diagnosis Date -  Diagnosis Status -  Planned Course of Treatment -  Phase of Treatment Chemo  Chemotherapy Actual Start Date: -  Chemotherapy Actual End Date: 10/25/2020  Surgery Actual Start Date: -  Navigator Follow Up Date: 11/25/2020  Navigator Follow Up Reason: Scan Review  Navigator Location [facility]  Referral Date to RadOnc/MedOnc -  Navigator Encounter Type Treatment;Appt/Treatment Plan Review  Telephone -  Treatment Initiated Date -  Patient Visit Type MedOnc  Treatment Phase Final Chemo TX  Barriers/Navigation Needs Coordination of Care;Education  Education Other  Interventions Coordination of Care;Education;Psycho-Social Support  Acuity Level 2-Minimal Needs (1-2 Barriers Identified)  Coordination of Care Appts;Radiology  Education Method Teach-back;Verbal;Written  Support Groups/Services Friends and Family  Time Spent with Patient 72

## 2020-10-25 NOTE — Telephone Encounter (Signed)
appts made per 10/25/20 los and pt will gain sch in tx/avs   John Hinton

## 2020-10-25 NOTE — Patient Instructions (Signed)
Aprepitant capsules What is this medicine? APREPITANT (ap RE pi tant) is used with other medicines to prevent nausea and vomiting caused by cancer treatment (chemotherapy). This medicine may be used for other purposes; ask your health care provider or pharmacist if you have questions. COMMON BRAND NAME(S): Emend What should I tell my health care provider before I take this medicine? They need to know if you have any of these conditions:  liver disease  an unusual or allergic reaction to aprepitant, fosaprepitant, other medicines, foods, dyes, or preservatives  pregnant or trying to get pregnant  breast-feeding How should I use this medicine? Take this medicine by mouth with a glass of water. Follow the directions on the prescription label. Usually, you will take your first dose one hour before your chemotherapy begins, and then once daily in the morning for the next 2 days after your chemotherapy treatment. This medicine may be taken with or without food. Do not take more often than directed. Talk to your pediatrician regarding the use of this medicine in children. While this drug may be prescribed for children as young as 12 years for selected conditions, precautions do apply. Overdosage: If you think you have taken too much of this medicine contact a poison control center or emergency room at once. NOTE: This medicine is only for you. Do not share this medicine with others. What if I miss a dose? If you miss a dose, take it as soon as you can. If it is almost time for your next dose, take only that dose. Do not take double or extra doses. What may interact with this medicine? Do not take this medicine with any of these medicines:  cisapride  flibanserin  lomitapide  pimozide This medicine may also interact with the following medications:  diltiazem  male hormones, like estrogens or progestins and birth control pills  medicines for fungal infections like ketoconazole and  itraconazole  medicines for HIV  medicines for seizures or to control epilepsy like carbamazepine or phenytoin  medicines used for sleep or anxiety disorders like alprazolam, diazepam, or midazolam  nefazodone  paroxetine  ranolazine  rifampin  some chemotherapy medications like etoposide, ifosfamide, vinblastine, vincristine  some antibiotics like clarithromycin, erythromycin, troleandomycin  steroid medicines like dexamethasone or methylprednisolone  tolbutamide  warfarin This list may not describe all possible interactions. Give your health care provider a list of all the medicines, herbs, non-prescription drugs, or dietary supplements you use. Also tell them if you smoke, drink alcohol, or use illegal drugs. Some items may interact with your medicine. What should I watch for while using this medicine? Do not take this medicine if you already have nausea and vomiting. Ask your health care provider what to do if you already have nausea. Birth control pills and other methods of hormonal contraception (for example, IUD or patch) may not work properly while you are taking this medicine. Use an extra method of birth control during treatment and for 1 month after your last dose of aprepitant. This medicine should not be used continuously for a long time. Visit your doctor or health care professional for regular check-ups. This medicine may change your liver function blood test results. What side effects may I notice from receiving this medicine? Side effects that you should report to your doctor or health care professional as soon as possible:  allergic reactions like skin rash, itching or hives, swelling of the face, lips, or tongue  breathing problems  changes in heart rhythm  high  or low blood pressure  rectal bleeding  serious dizziness or disorientation, confusion  sharp or severe stomach pain  sharp pain in your leg Side effects that usually do not require medical  attention (report to your doctor or health care professional if they continue or are bothersome):  constipation or diarrhea  hair loss  headache  hiccups  loss of appetite  nausea  upset stomach  tiredness This list may not describe all possible side effects. Call your doctor for medical advice about side effects. You may report side effects to FDA at 1-800-FDA-1088. Where should I keep my medicine? Keep out of the reach of children. Store at room temperature between 20 and 25 degrees C (68 and 77 degrees F). Throw away any unused medicine after the expiration date. NOTE: This sheet is a summary. It may not cover all possible information. If you have questions about this medicine, talk to your doctor, pharmacist, or health care provider.  2021 Elsevier/Gold Standard (2018-04-15 13:36:30) Dexamethasone injection What is this medicine? DEXAMETHASONE (dex a METH a sone) is a corticosteroid. It is used to treat inflammation of the skin, joints, lungs, and other organs. Common conditions treated include asthma, allergies, and arthritis. It is also used for other conditions, like blood disorders and diseases of the adrenal glands. This medicine may be used for other purposes; ask your health care provider or pharmacist if you have questions. COMMON BRAND NAME(S): Decadron, DoubleDex, ReadySharp Dexamethasone, Simplist Dexamethasone, Solurex What should I tell my health care provider before I take this medicine? They need to know if you have any of these conditions:  Cushing's syndrome  diabetes  glaucoma  heart disease  high blood pressure  infection like herpes, measles, tuberculosis, or chickenpox  kidney disease  liver disease  mental illness  myasthenia gravis  osteoporosis  previous heart attack  seizures  stomach or intestine problems  thyroid disease  an unusual or allergic reaction to dexamethasone, corticosteroids, other medicines, lactose, foods,  dyes, or preservatives  pregnant or trying to get pregnant  breast-feeding How should I use this medicine? This medicine is for injection into a muscle, joint, lesion, soft tissue, or vein. It is given by a health care professional in a hospital or clinic setting. Talk to your pediatrician regarding the use of this medicine in children. Special care may be needed. Overdosage: If you think you have taken too much of this medicine contact a poison control center or emergency room at once. NOTE: This medicine is only for you. Do not share this medicine with others. What if I miss a dose? This may not apply. If you are having a series of injections over a prolonged period, try not to miss an appointment. Call your doctor or health care professional to reschedule if you are unable to keep an appointment. What may interact with this medicine? Do not take this medicine with any of the following medications:  live virus vaccines This medicine may also interact with the following medications:  aminoglutethimide  amphotericin B  aspirin and aspirin-like medicines  certain antibiotics like erythromycin, clarithromycin, and troleandomycin  certain antivirals for HIV or hepatitis  certain medicines for seizures like carbamazepine, phenobarbital, phenytoin  certain medicines to treat myasthenia gravis  cholestyramine  cyclosporine  digoxin  diuretics  ephedrine  male hormones, like estrogen or progestins and birth control pills  insulin or other medicines for diabetes  isoniazid  ketoconazole  medicines that relax muscles for surgery  mifepristone  NSAIDs, medicines for  pain and inflammation, like ibuprofen or naproxen  rifampin  skin tests for allergies  thalidomide  vaccines  warfarin This list may not describe all possible interactions. Give your health care provider a list of all the medicines, herbs, non-prescription drugs, or dietary supplements you use.  Also tell them if you smoke, drink alcohol, or use illegal drugs. Some items may interact with your medicine. What should I watch for while using this medicine? Visit your health care professional for regular checks on your progress. Tell your health care professional if your symptoms do not start to get better or if they get worse. Your condition will be monitored carefully while you are receiving this medicine. Wear a medical ID bracelet or chain. Carry a card that describes your disease and details of your medicine and dosage times. This medicine may increase your risk of getting an infection. Call your health care professional for advice if you get a fever, chills, or sore throat, or other symptoms of a cold or flu. Do not treat yourself. Try to avoid being around people who are sick. Call your health care professional if you are around anyone with measles, chickenpox, or if you develop sores or blisters that do not heal properly. If you are going to need surgery or other procedures, tell your doctor or health care professional that you have taken this medicine within the last 12 months. Ask your doctor or health care professional about your diet. You may need to lower the amount of salt you eat. This medicine may increase blood sugar. Ask your healthcare provider if changes in diet or medicines are needed if you have diabetes. What side effects may I notice from receiving this medicine? Side effects that you should report to your doctor or health care professional as soon as possible:  allergic reactions like skin rash, itching or hives, swelling of the face, lips, or tongue  bloody or black, tarry stools  changes in emotions or moods  changes in vision  confusion, excitement, restlessness  depressed mood  eye pain  hallucinations  muscle weakness  severe or sudden stomach or belly pain  signs and symptoms of high blood sugar such as being more thirsty or hungry or having to  urinate more than normal. You may also feel very tired or have blurry vision.  signs and symptoms of infection like fever; chills; cough; sore throat; pain or trouble passing urine  swelling of ankles, feet  unusual bruising or bleeding  wounds that do not heal Side effects that usually do not require medical attention (report to your doctor or health care professional if they continue or are bothersome):  increased appetite  increased growth of face or body hair  headache  nausea, vomiting  pain, redness, or irritation at site where injected  skin problems, acne, thin and shiny skin  trouble sleeping  weight gain This list may not describe all possible side effects. Call your doctor for medical advice about side effects. You may report side effects to FDA at 1-800-FDA-1088. Where should I keep my medicine? This medicine is given in a hospital or clinic and will not be stored at home. NOTE: This sheet is a summary. It may not cover all possible information. If you have questions about this medicine, talk to your doctor, pharmacist, or health care provider.  2021 Elsevier/Gold Standard (2019-01-17 13:51:58) Palonosetron Injection What is this medicine? PALONOSETRON (pal oh NOE se tron) is used to prevent nausea and vomiting caused by chemotherapy. It  also helps prevent delayed nausea and vomiting that may occur a few days after your treatment. This medicine may be used for other purposes; ask your health care provider or pharmacist if you have questions. COMMON BRAND NAME(S): Aloxi What should I tell my health care provider before I take this medicine? They need to know if you have any of these conditions:  an unusual or allergic reaction to palonosetron, dolasetron, granisetron, ondansetron, other medicines, foods, dyes, or preservatives  pregnant or trying to get pregnant  breast-feeding How should I use this medicine? This medicine is for infusion into a vein. It is  given by a health care professional in a hospital or clinic setting. Talk to your pediatrician regarding the use of this medicine in children. While this drug may be prescribed for children as young as 1 month for selected conditions, precautions do apply. Overdosage: If you think you have taken too much of this medicine contact a poison control center or emergency room at once. NOTE: This medicine is only for you. Do not share this medicine with others. What if I miss a dose? This does not apply. What may interact with this medicine?  certain medicines for depression, anxiety, or psychotic disturbances  fentanyl  linezolid  MAOIs like Carbex, Eldepryl, Marplan, Nardil, and Parnate  methylene blue (injected into a vein)  tramadol This list may not describe all possible interactions. Give your health care provider a list of all the medicines, herbs, non-prescription drugs, or dietary supplements you use. Also tell them if you smoke, drink alcohol, or use illegal drugs. Some items may interact with your medicine. What should I watch for while using this medicine? Your condition will be monitored carefully while you are receiving this medicine. What side effects may I notice from receiving this medicine? Side effects that you should report to your doctor or health care professional as soon as possible:  allergic reactions like skin rash, itching or hives, swelling of the face, lips, or tongue  breathing problems  confusion  dizziness  fast, irregular heartbeat  fever and chills  loss of balance or coordination  seizures  sweating  swelling of the hands and feet  tremors  unusually weak or tired Side effects that usually do not require medical attention (report to your doctor or health care professional if they continue or are bothersome):  constipation or diarrhea  headache This list may not describe all possible side effects. Call your doctor for medical advice  about side effects. You may report side effects to FDA at 1-800-FDA-1088. Where should I keep my medicine? This drug is given in a hospital or clinic and will not be stored at home. NOTE: This sheet is a summary. It may not cover all possible information. If you have questions about this medicine, talk to your doctor, pharmacist, or health care provider.  2021 Elsevier/Gold Standard (2013-05-12 10:38:36) Carboplatin injection What is this medicine? CARBOPLATIN (KAR boe pla tin) is a chemotherapy drug. It targets fast dividing cells, like cancer cells, and causes these cells to die. This medicine is used to treat ovarian cancer and many other cancers. This medicine may be used for other purposes; ask your health care provider or pharmacist if you have questions. COMMON BRAND NAME(S): Paraplatin What should I tell my health care provider before I take this medicine? They need to know if you have any of these conditions:  blood disorders  hearing problems  kidney disease  recent or ongoing radiation therapy  an  unusual or allergic reaction to carboplatin, cisplatin, other chemotherapy, other medicines, foods, dyes, or preservatives  pregnant or trying to get pregnant  breast-feeding How should I use this medicine? This drug is usually given as an infusion into a vein. It is administered in a hospital or clinic by a specially trained health care professional. Talk to your pediatrician regarding the use of this medicine in children. Special care may be needed. Overdosage: If you think you have taken too much of this medicine contact a poison control center or emergency room at once. NOTE: This medicine is only for you. Do not share this medicine with others. What if I miss a dose? It is important not to miss a dose. Call your doctor or health care professional if you are unable to keep an appointment. What may interact with this medicine?  medicines for seizures  medicines to  increase blood counts like filgrastim, pegfilgrastim, sargramostim  some antibiotics like amikacin, gentamicin, neomycin, streptomycin, tobramycin  vaccines Talk to your doctor or health care professional before taking any of these medicines:  acetaminophen  aspirin  ibuprofen  ketoprofen  naproxen This list may not describe all possible interactions. Give your health care provider a list of all the medicines, herbs, non-prescription drugs, or dietary supplements you use. Also tell them if you smoke, drink alcohol, or use illegal drugs. Some items may interact with your medicine. What should I watch for while using this medicine? Your condition will be monitored carefully while you are receiving this medicine. You will need important blood work done while you are taking this medicine. This drug may make you feel generally unwell. This is not uncommon, as chemotherapy can affect healthy cells as well as cancer cells. Report any side effects. Continue your course of treatment even though you feel ill unless your doctor tells you to stop. In some cases, you may be given additional medicines to help with side effects. Follow all directions for their use. Call your doctor or health care professional for advice if you get a fever, chills or sore throat, or other symptoms of a cold or flu. Do not treat yourself. This drug decreases your body's ability to fight infections. Try to avoid being around people who are sick. This medicine may increase your risk to bruise or bleed. Call your doctor or health care professional if you notice any unusual bleeding. Be careful brushing and flossing your teeth or using a toothpick because you may get an infection or bleed more easily. If you have any dental work done, tell your dentist you are receiving this medicine. Avoid taking products that contain aspirin, acetaminophen, ibuprofen, naproxen, or ketoprofen unless instructed by your doctor. These medicines may  hide a fever. Do not become pregnant while taking this medicine. Women should inform their doctor if they wish to become pregnant or think they might be pregnant. There is a potential for serious side effects to an unborn child. Talk to your health care professional or pharmacist for more information. Do not breast-feed an infant while taking this medicine. What side effects may I notice from receiving this medicine? Side effects that you should report to your doctor or health care professional as soon as possible:  allergic reactions like skin rash, itching or hives, swelling of the face, lips, or tongue  signs of infection - fever or chills, cough, sore throat, pain or difficulty passing urine  signs of decreased platelets or bleeding - bruising, pinpoint red spots on the skin, black,  tarry stools, nosebleeds  signs of decreased red blood cells - unusually weak or tired, fainting spells, lightheadedness  breathing problems  changes in hearing  changes in vision  chest pain  high blood pressure  low blood counts - This drug may decrease the number of white blood cells, red blood cells and platelets. You may be at increased risk for infections and bleeding.  nausea and vomiting  pain, swelling, redness or irritation at the injection site  pain, tingling, numbness in the hands or feet  problems with balance, talking, walking  trouble passing urine or change in the amount of urine Side effects that usually do not require medical attention (report to your doctor or health care professional if they continue or are bothersome):  hair loss  loss of appetite  metallic taste in the mouth or changes in taste This list may not describe all possible side effects. Call your doctor for medical advice about side effects. You may report side effects to FDA at 1-800-FDA-1088. Where should I keep my medicine? This drug is given in a hospital or clinic and will not be stored at home. NOTE:  This sheet is a summary. It may not cover all possible information. If you have questions about this medicine, talk to your doctor, pharmacist, or health care provider.  2021 Elsevier/Gold Standard (2007-10-11 14:38:05) Pembrolizumab injection What is this medicine? PEMBROLIZUMAB (pem broe liz ue mab) is a monoclonal antibody. It is used to treat certain types of cancer. This medicine may be used for other purposes; ask your health care provider or pharmacist if you have questions. COMMON BRAND NAME(S): Keytruda What should I tell my health care provider before I take this medicine? They need to know if you have any of these conditions:  autoimmune diseases like Crohn's disease, ulcerative colitis, or lupus  have had or planning to have an allogeneic stem cell transplant (uses someone else's stem cells)  history of organ transplant  history of chest radiation  nervous system problems like myasthenia gravis or Guillain-Barre syndrome  an unusual or allergic reaction to pembrolizumab, other medicines, foods, dyes, or preservatives  pregnant or trying to get pregnant  breast-feeding How should I use this medicine? This medicine is for infusion into a vein. It is given by a health care professional in a hospital or clinic setting. A special MedGuide will be given to you before each treatment. Be sure to read this information carefully each time. Talk to your pediatrician regarding the use of this medicine in children. While this drug may be prescribed for children as young as 6 months for selected conditions, precautions do apply. Overdosage: If you think you have taken too much of this medicine contact a poison control center or emergency room at once. NOTE: This medicine is only for you. Do not share this medicine with others. What if I miss a dose? It is important not to miss your dose. Call your doctor or health care professional if you are unable to keep an appointment. What may  interact with this medicine? Interactions have not been studied. This list may not describe all possible interactions. Give your health care provider a list of all the medicines, herbs, non-prescription drugs, or dietary supplements you use. Also tell them if you smoke, drink alcohol, or use illegal drugs. Some items may interact with your medicine. What should I watch for while using this medicine? Your condition will be monitored carefully while you are receiving this medicine. You may need blood  work done while you are taking this medicine. Do not become pregnant while taking this medicine or for 4 months after stopping it. Women should inform their doctor if they wish to become pregnant or think they might be pregnant. There is a potential for serious side effects to an unborn child. Talk to your health care professional or pharmacist for more information. Do not breast-feed an infant while taking this medicine or for 4 months after the last dose. What side effects may I notice from receiving this medicine? Side effects that you should report to your doctor or health care professional as soon as possible:  allergic reactions like skin rash, itching or hives, swelling of the face, lips, or tongue  bloody or black, tarry  breathing problems  changes in vision  chest pain  chills  confusion  constipation  cough  diarrhea  dizziness or feeling faint or lightheaded  fast or irregular heartbeat  fever  flushing  joint pain  low blood counts - this medicine may decrease the number of white blood cells, red blood cells and platelets. You may be at increased risk for infections and bleeding.  muscle pain  muscle weakness  pain, tingling, numbness in the hands or feet  persistent headache  redness, blistering, peeling or loosening of the skin, including inside the mouth  signs and symptoms of high blood sugar such as dizziness; dry mouth; dry skin; fruity breath; nausea;  stomach pain; increased hunger or thirst; increased urination  signs and symptoms of kidney injury like trouble passing urine or change in the amount of urine  signs and symptoms of liver injury like dark urine, light-colored stools, loss of appetite, nausea, right upper belly pain, yellowing of the eyes or skin  sweating  swollen lymph nodes  weight loss Side effects that usually do not require medical attention (report to your doctor or health care professional if they continue or are bothersome):  decreased appetite  hair loss  tiredness This list may not describe all possible side effects. Call your doctor for medical advice about side effects. You may report side effects to FDA at 1-800-FDA-1088. Where should I keep my medicine? This drug is given in a hospital or clinic and will not be stored at home. NOTE: This sheet is a summary. It may not cover all possible information. If you have questions about this medicine, talk to your doctor, pharmacist, or health care provider.  2021 [employer]/Gold Standard (2019-06-07 21:44:53) Pemetrexed injection What is this medicine? PEMETREXED (PEM e TREX ed) is a chemotherapy drug used to treat lung cancers like non-small cell lung cancer and mesothelioma. It may also be used to treat other cancers. This medicine may be used for other purposes; ask your health care provider or pharmacist if you have questions. COMMON BRAND NAME(S): Alimta What should I tell my health care provider before I take this medicine? They need to know if you have any of these conditions:  infection (especially a virus infection such as chickenpox, cold sores, or herpes)  kidney disease  low blood counts, like low white cell, platelet, or red cell counts  lung or breathing disease, like asthma  radiation therapy  an unusual or allergic reaction to pemetrexed, other medicines, foods, dyes, or preservative  pregnant or trying to get  pregnant  breast-feeding How should I use this medicine? This drug is given as an infusion into a vein. It is administered in a hospital or clinic by a specially trained health care professional.  Talk to your pediatrician regarding the use of this medicine in children. Special care may be needed. Overdosage: If you think you have taken too much of this medicine contact a poison control center or emergency room at once. NOTE: This medicine is only for you. Do not share this medicine with others. What if I miss a dose? It is important not to miss your dose. Call your doctor or health care professional if you are unable to keep an appointment. What may interact with this medicine? This medicine may interact with the following medications:  Ibuprofen This list may not describe all possible interactions. Give your health care provider a list of all the medicines, herbs, non-prescription drugs, or dietary supplements you use. Also tell them if you smoke, drink alcohol, or use illegal drugs. Some items may interact with your medicine. What should I watch for while using this medicine? Visit your doctor for checks on your progress. This drug may make you feel generally unwell. This is not uncommon, as chemotherapy can affect healthy cells as well as cancer cells. Report any side effects. Continue your course of treatment even though you feel ill unless your doctor tells you to stop. In some cases, you may be given additional medicines to help with side effects. Follow all directions for their use. Call your doctor or health care professional for advice if you get a fever, chills or sore throat, or other symptoms of a cold or flu. Do not treat yourself. This drug decreases your body's ability to fight infections. Try to avoid being around people who are sick. This medicine may increase your risk to bruise or bleed. Call your doctor or health care professional if you notice any unusual bleeding. Be careful  brushing and flossing your teeth or using a toothpick because you may get an infection or bleed more easily. If you have any dental work done, tell your dentist you are receiving this medicine. Avoid taking products that contain aspirin, acetaminophen, ibuprofen, naproxen, or ketoprofen unless instructed by your doctor. These medicines may hide a fever. Call your doctor or health care professional if you get diarrhea or mouth sores. Do not treat yourself. To protect your kidneys, drink water or other fluids as directed while you are taking this medicine. Do not become pregnant while taking this medicine or for 6 months after stopping it. Women should inform their doctor if they wish to become pregnant or think they might be pregnant. Men should not father a child while taking this medicine and for 3 months after stopping it. This may interfere with the ability to father a child. You should talk to your doctor or health care professional if you are concerned about your fertility. There is a potential for serious side effects to an unborn child. Talk to your health care professional or pharmacist for more information. Do not breast-feed an infant while taking this medicine or for 1 week after stopping it. What side effects may I notice from receiving this medicine? Side effects that you should report to your doctor or health care professional as soon as possible:  allergic reactions like skin rash, itching or hives, swelling of the face, lips, or tongue  breathing problems  redness, blistering, peeling or loosening of the skin, including inside the mouth  signs and symptoms of bleeding such as bloody or black, tarry stools; red or dark-brown urine; spitting up blood or brown material that looks like coffee grounds; red spots on the skin; unusual bruising or  bleeding from the eye, gums, or nose  signs and symptoms of infection like fever or chills; cough; sore throat; pain or trouble passing  urine  signs and symptoms of kidney injury like trouble passing urine or change in the amount of urine  signs and symptoms of liver injury like dark yellow or brown urine; general ill feeling or flu-like symptoms; light-colored stools; loss of appetite; nausea; right upper belly pain; unusually weak or tired; yellowing of the eyes or skin Side effects that usually do not require medical attention (report to your doctor or health care professional if they continue or are bothersome):  constipation  mouth sores  nausea, vomiting  unusually weak or tired This list may not describe all possible side effects. Call your doctor for medical advice about side effects. You may report side effects to FDA at 1-800-FDA-1088. Where should I keep my medicine? This drug is given in a hospital or clinic and will not be stored at home. NOTE: This sheet is a summary. It may not cover all possible information. If you have questions about this medicine, talk to your doctor, pharmacist, or health care provider.  2021 Elsevier/Gold Standard (2017-08-25 16:11:33)

## 2020-10-26 LAB — T4: T4, Total: 4.9 ug/dL (ref 4.5–12.0)

## 2020-11-01 ENCOUNTER — Encounter: Payer: Self-pay | Admitting: *Deleted

## 2020-11-05 DIAGNOSIS — I25119 Atherosclerotic heart disease of native coronary artery with unspecified angina pectoris: Secondary | ICD-10-CM | POA: Diagnosis not present

## 2020-11-05 DIAGNOSIS — N401 Enlarged prostate with lower urinary tract symptoms: Secondary | ICD-10-CM | POA: Diagnosis not present

## 2020-11-05 DIAGNOSIS — E7800 Pure hypercholesterolemia, unspecified: Secondary | ICD-10-CM | POA: Diagnosis not present

## 2020-11-05 DIAGNOSIS — C3412 Malignant neoplasm of upper lobe, left bronchus or lung: Secondary | ICD-10-CM | POA: Diagnosis not present

## 2020-11-05 DIAGNOSIS — I2510 Atherosclerotic heart disease of native coronary artery without angina pectoris: Secondary | ICD-10-CM | POA: Diagnosis not present

## 2020-11-05 DIAGNOSIS — I10 Essential (primary) hypertension: Secondary | ICD-10-CM | POA: Diagnosis not present

## 2020-11-25 ENCOUNTER — Encounter (HOSPITAL_COMMUNITY)
Admission: RE | Admit: 2020-11-25 | Discharge: 2020-11-25 | Disposition: A | Discharge status: Home / Self Care | Payer: Medicare Other | Source: Ambulatory Visit | Attending: Hematology & Oncology | Admitting: Hematology & Oncology

## 2020-11-25 ENCOUNTER — Encounter: Payer: Self-pay | Admitting: *Deleted

## 2020-11-25 ENCOUNTER — Other Ambulatory Visit: Payer: Self-pay

## 2020-11-25 DIAGNOSIS — Z79899 Other long term (current) drug therapy: Secondary | ICD-10-CM | POA: Diagnosis not present

## 2020-11-25 DIAGNOSIS — C3490 Malignant neoplasm of unspecified part of unspecified bronchus or lung: Secondary | ICD-10-CM | POA: Diagnosis not present

## 2020-11-25 DIAGNOSIS — C3492 Malignant neoplasm of unspecified part of left bronchus or lung: Secondary | ICD-10-CM | POA: Diagnosis not present

## 2020-11-25 LAB — GLUCOSE, CAPILLARY: Glucose-Capillary: 106 mg/dL — ABNORMAL HIGH (ref 70–99)

## 2020-11-25 IMAGING — PT NM PET TUM IMG RESTAG (PS) SKULL BASE T - THIGH
1 of 7 series · 1 of 25 positions shown · non-contrast
Comparison: [DATE], [DATE]

CLINICAL DATA: Subsequent treatment strategy for bronchogenic
carcinoma. LEFT upper lobe resection

EXAM:
NUCLEAR MEDICINE PET SKULL BASE TO THIGH
TECHNIQUE: 10.3 mCi F-18 FDG was injected intravenously. Full-ring PET imaging
was performed from the skull base to thigh after the radiotracer. CT
data was obtained and used for attenuation correction and anatomic
localization.
Fasting blood glucose: 106 mg/dl

[Series 4: ct sk_thigh 5.0 bf37 · axial · 5.0mm · 0.98mm/px · 1 of 237 slices shown]
[im 237/237  brain]
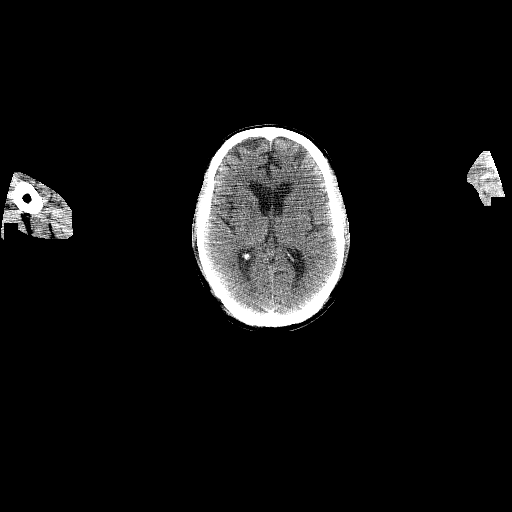

[1 of 25 positions shown; findings below may reference images not displayed]

FINDINGS: Mediastinal blood pool activity: SUV max

Liver activity: SUV max NA

NECK: No hypermetabolic lymph nodes in the neck.

Incidental CT findings: none

CHEST: Post LEFT upper lobe resection. No evidence of local
recurrence. Mild metabolic activity int left anterior chest wall
associated with haziness within the mediastinal fat (image 70/PET)
is most consistent with benign post surgical inflammation.

Focus of uptake along the transverse thoracic arch is favored
related to atherosclerotic disease.

No evidence supraclavicular adenopathy.  No mediastinal adenopathy.

Loculated effusion at the LEFT lung base with associated pleural
thickening. The pleural thickening is mild metabolic activity which
is favored inflammatory. SUV max equal 2.3)

Incidental CT findings: No suspicious pulmonary nodules.

ABDOMEN/PELVIS: No abnormal hypermetabolic activity within the
liver, pancreas, adrenal glands, or spleen. No hypermetabolic lymph
nodes in the abdomen or pelvis.

Incidental CT findings: none

SKELETON: No focal hypermetabolic activity to suggest skeletal
metastasis.

Incidental CT findings: none
IMPRESSION: 1. No evidence of local recurrence following LEFT upper lobe
resection.
2. Loculated fluid collection at the LEFT lung base with mild
increase in complexity. Increase in metabolic activity with the
thickened pleura is favored inflammatory.
3. No evidence of mediastinal nodal metastasis or supraclavicular
nodal metastasis.
4. No evidence distant metastatic disease.

## 2020-11-25 MED ORDER — FLUDEOXYGLUCOSE F - 18 (FDG) INJECTION
10.0000 | Freq: Once | INTRAVENOUS | Status: AC
  Administered 2020-11-25: 10.26 via INTRAVENOUS

## 2020-11-25 NOTE — Progress Notes (Signed)
Oncology Nurse Navigator Documentation  Oncology Nurse Navigator Flowsheets 11/25/2020  Abnormal Finding Date -  Confirmed Diagnosis Date -  Diagnosis Status -  Planned Course of Treatment -  Phase of Treatment -  Chemotherapy Actual Start Date: -  Chemotherapy Actual End Date: -  Surgery Actual Start Date: -  Navigator Follow Up Date: 11/28/2020  Navigator Follow Up Reason: Follow-up Appointment  Navigator Location [facility]  Referral Date to RadOnc/MedOnc -  Navigator Encounter Type Scan Review  Telephone -  Treatment Initiated Date -  Patient Visit Type MedOnc  Treatment Phase Active Tx  Barriers/Navigation Needs Coordination of Care;Education  Education -  Interventions None Required  Acuity Level 2-Minimal Needs (1-2 Barriers Identified)  Coordination of Care -  Education Method -  Support Groups/Services Friends and Family  Time Spent with Patient 15

## 2020-11-26 ENCOUNTER — Telehealth: Payer: Self-pay | Admitting: Cardiology

## 2020-11-26 ENCOUNTER — Telehealth: Payer: Self-pay | Admitting: *Deleted

## 2020-11-26 NOTE — Telephone Encounter (Signed)
-----   Message from [name], MD sent at 11/25/2020  5:08 PM EDT ----- Call - NO evidence of residual cancer!!!  God is GREAT!!  Wonderful news!!  Laurey Arrow

## 2020-11-26 NOTE — Telephone Encounter (Signed)
Notified pt of results. Reviewed pt appts. Pt verbalized understanding, no concerns at this time.

## 2020-11-26 NOTE — Telephone Encounter (Signed)
Received a message via Pt schedule requesting an appt for "breathing issues" with Dr. [name]. Pt was scheduled for her first available at this time on 01/02/21 at 8:00 AM. Advised him this message is being sent and a nurse will be contacting him to discuss his symptoms further. Please advise.

## 2020-11-28 ENCOUNTER — Other Ambulatory Visit: Payer: Self-pay

## 2020-11-28 ENCOUNTER — Inpatient Hospital Stay: Payer: Medicare Other

## 2020-11-28 ENCOUNTER — Encounter: Payer: Self-pay | Admitting: *Deleted

## 2020-11-28 ENCOUNTER — Inpatient Hospital Stay: Payer: Medicare Other | Attending: Hematology & Oncology

## 2020-11-28 ENCOUNTER — Encounter: Payer: Self-pay | Admitting: Hematology & Oncology

## 2020-11-28 ENCOUNTER — Inpatient Hospital Stay (HOSPITAL_BASED_OUTPATIENT_CLINIC_OR_DEPARTMENT_OTHER): Payer: Medicare Other | Admitting: Hematology & Oncology

## 2020-11-28 VITALS — HR 85 | Resp 20

## 2020-11-28 VITALS — BP 107/89 | Wt 207.0 lb

## 2020-11-28 DIAGNOSIS — Z79899 Other long term (current) drug therapy: Secondary | ICD-10-CM | POA: Insufficient documentation

## 2020-11-28 DIAGNOSIS — C3492 Malignant neoplasm of unspecified part of left bronchus or lung: Secondary | ICD-10-CM | POA: Diagnosis not present

## 2020-11-28 DIAGNOSIS — Z5111 Encounter for antineoplastic chemotherapy: Secondary | ICD-10-CM | POA: Insufficient documentation

## 2020-11-28 DIAGNOSIS — Z5112 Encounter for antineoplastic immunotherapy: Secondary | ICD-10-CM | POA: Diagnosis not present

## 2020-11-28 DIAGNOSIS — C3412 Malignant neoplasm of upper lobe, left bronchus or lung: Secondary | ICD-10-CM | POA: Diagnosis not present

## 2020-11-28 LAB — CBC WITH DIFFERENTIAL (CANCER CENTER ONLY)
Abs Immature Granulocytes: 0.03 10*3/uL (ref 0.00–0.07)
Basophils Absolute: 0 10*3/uL (ref 0.0–0.1)
Basophils Relative: 0 %
Eosinophils Absolute: 0 10*3/uL (ref 0.0–0.5)
Eosinophils Relative: 1 %
HCT: 33.8 % — ABNORMAL LOW (ref 39.0–52.0)
Hemoglobin: 10.9 g/dL — ABNORMAL LOW (ref 13.0–17.0)
Immature Granulocytes: 1 %
Lymphocytes Relative: 12 %
Lymphs Abs: 0.7 10*3/uL (ref 0.7–4.0)
MCH: 32.3 pg (ref 26.0–34.0)
MCHC: 32.2 g/dL (ref 30.0–36.0)
MCV: 100.3 fL — ABNORMAL HIGH (ref 80.0–100.0)
Monocytes Absolute: 0.6 10*3/uL (ref 0.1–1.0)
Monocytes Relative: 11 %
Neutro Abs: 4.4 10*3/uL (ref 1.7–7.7)
Neutrophils Relative %: 75 %
Platelet Count: 183 10*3/uL (ref 150–400)
RBC: 3.37 MIL/uL — ABNORMAL LOW (ref 4.22–5.81)
RDW: 16.9 % — ABNORMAL HIGH (ref 11.5–15.5)
WBC Count: 5.8 10*3/uL (ref 4.0–10.5)
nRBC: 0 % (ref 0.0–0.2)

## 2020-11-28 LAB — CMP (CANCER CENTER ONLY)
ALT: 15 U/L (ref 0–44)
AST: 23 U/L (ref 15–41)
Albumin: 4.4 g/dL (ref 3.5–5.0)
Alkaline Phosphatase: 71 U/L (ref 38–126)
Anion gap: 10 (ref 5–15)
BUN: 20 mg/dL (ref 8–23)
CO2: 25 mmol/L (ref 22–32)
Calcium: 9.8 mg/dL (ref 8.9–10.3)
Chloride: 105 mmol/L (ref 98–111)
Creatinine: 0.93 mg/dL (ref 0.61–1.24)
GFR, Estimated: >60 mL/min (ref >60)
Glucose, Bld: 99 mg/dL (ref 70–99)
Potassium: 4 mmol/L (ref 3.5–5.1)
Sodium: 140 mmol/L (ref 135–145)
Total Bilirubin: 0.3 mg/dL (ref 0.3–1.2)
Total Protein: 7.1 g/dL (ref 6.5–8.1)

## 2020-11-28 LAB — LACTATE DEHYDROGENASE: LDH: 197 U/L — ABNORMAL HIGH (ref 98–192)

## 2020-11-28 LAB — TSH: TSH: 2.924 u[IU]/mL (ref 0.320–4.118)

## 2020-11-28 MED ORDER — SODIUM CHLORIDE 0.9 % IV SOLN
500.0000 mg/m2 | Freq: Once | INTRAVENOUS | Status: AC
  Administered 2020-11-28: 1100 mg via INTRAVENOUS
  Filled 2020-11-28: qty 40

## 2020-11-28 MED ORDER — SODIUM CHLORIDE 0.9 % IV SOLN
200.0000 mg | Freq: Once | INTRAVENOUS | Status: AC
  Administered 2020-11-28: 200 mg via INTRAVENOUS
  Filled 2020-11-28: qty 8

## 2020-11-28 MED ORDER — CYANOCOBALAMIN 1000 MCG/ML IJ SOLN
INTRAMUSCULAR | Status: AC
  Filled 2020-11-28: qty 1

## 2020-11-28 MED ORDER — PALONOSETRON HCL INJECTION 0.25 MG/5ML
INTRAVENOUS | Status: AC
  Filled 2020-11-28: qty 5

## 2020-11-28 MED ORDER — SODIUM CHLORIDE 0.9 % IV SOLN
150.0000 mg | Freq: Once | INTRAVENOUS | Status: AC
  Administered 2020-11-28: 150 mg via INTRAVENOUS
  Filled 2020-11-28: qty 150

## 2020-11-28 MED ORDER — PALONOSETRON HCL INJECTION 0.25 MG/5ML
0.2500 mg | Freq: Once | INTRAVENOUS | Status: AC
  Administered 2020-11-28: 0.25 mg via INTRAVENOUS

## 2020-11-28 MED ORDER — DEXAMETHASONE SODIUM PHOSPHATE 100 MG/10ML IJ SOLN
10.0000 mg | Freq: Once | INTRAMUSCULAR | Status: AC
  Administered 2020-11-28: 10 mg via INTRAVENOUS
  Filled 2020-11-28: qty 10

## 2020-11-28 MED ORDER — CYANOCOBALAMIN 1000 MCG/ML IJ SOLN: 1000.0000 ug | Freq: Once | INTRAMUSCULAR | Status: DC

## 2020-11-28 MED ORDER — SODIUM CHLORIDE 0.9 % IV SOLN
Freq: Once | INTRAVENOUS | Status: AC
Start: 2020-11-28 — End: 2020-11-28
  Filled 2020-11-28: qty 250

## 2020-11-28 NOTE — Progress Notes (Signed)
Patient has finished his initial course of chemo and will now enter the maintenance phase. His recent PET scan showed no evidence of recurrence. He is doing well and has no complaints.   Oncology Nurse Navigator Documentation  Oncology Nurse Navigator Flowsheets 11/28/2020  Abnormal Finding Date -  Confirmed Diagnosis Date -  Diagnosis Status -  Planned Course of Treatment -  Phase of Treatment -  Chemotherapy Actual Start Date: -  Chemotherapy Actual End Date: -  Surgery Actual Start Date: -  Navigator Follow Up Date: 12/19/2020  Navigator Follow Up Reason: Follow-up Appointment;Chemotherapy  Navigator Location [facility]  Referral Date to RadOnc/MedOnc -  Navigator Encounter Type Treatment  Telephone -  Treatment Initiated Date -  Patient Visit Type MedOnc  Treatment Phase Active Tx  Barriers/Navigation Needs Coordination of Care;Education  Education Other  Interventions Education;Psycho-Social Support  Acuity Level 2-Minimal Needs (1-2 Barriers Identified)  Coordination of Care -  Education Method Verbal  Support Groups/Services Friends and Family  Time Spent with Patient 30

## 2020-11-28 NOTE — Progress Notes (Signed)
Hematology and Oncology Follow Up Visit  John Hinton [id number] [birth year] 75 y.o. 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  He looks fantastic.  He feels good.  We did go ahead and do a PET scan on him.  This was done couple days ago.  The PET scan did not show any evidence of local recurrence or metastatic disease.  There is a loculated fluid in the left lung base.  No evidence of mediastinal nodal or supraclavicular nodal metastasis.  I do think that we have to move ahead with maintenance therapy.  I think that this is somewhat of a "flexible" situation.  To me, I think would be reasonable to give both Alimta with pembrolizumab.  I would probably do this for 4 cycles.  After that, then I think that maintenance pembrolizumab would be very reasonable.  He has had no problems with cough or shortness of breath.  There is been no pain.  He said no change in bowel or bladder habits.  He has had no leg swelling.  He has had no rashes.  Overall, his performance status is ECOG 0.   Medications:  Current Outpatient Medications:  .  aspirin 81 MG tablet, Take 81 mg by mouth at bedtime. , Disp: , Rfl:  .  cholecalciferol (VITAMIN D) 1000 UNITS tablet, Take 1,000 Units by mouth at bedtime. , Disp: , Rfl:  .  clopidogrel (PLAVIX) 75 MG tablet, Take 1 tablet (75 mg total) by mouth daily., Disp: 90 tablet, Rfl: 3 .  Cyanocobalamin (B-12 PO), Take 1,000 mcg by mouth daily. , Disp: , Rfl:  .  dexamethasone (DECADRON) 4 MG tablet, Take 1 tab two times a day the day before Alimta chemo, then take 2 tabs once a day for 3 days starting the day after cisplatin., Disp: 30 tablet, Rfl: 1 .  doxazosin (CARDURA) 2 MG tablet, Take 1 tablet (2 mg  total) by mouth daily. (Patient taking differently: Take 2 mg by mouth at bedtime.), Disp: 90 tablet, Rfl: 2 .  ezetimibe (ZETIA) 10 MG tablet, Take 1 tablet (10 mg total) by mouth daily., Disp: 90 tablet, Rfl: 3 .  folic acid (FOLVITE) 1 MG tablet, Take 1 tablet (1 mg total) by mouth daily. Start 5-7 days before Alimta chemotherapy. Continue until 21 days after Alimta completed., Disp: 100 tablet, Rfl: 3 .  folic acid (FOLVITE) 371 MCG tablet, Take 400 mcg by mouth daily., Disp: , Rfl:  .  Multiple Vitamin (MULTIVITAMIN) tablet, Take 1 tablet by mouth daily., Disp: , Rfl:  .  nitroGLYCERIN (NITROSTAT) 0.4 MG SL tablet, Place 1 tablet (0.4 mg total) under the tongue every 5 (five) minutes as needed for chest pain. (Patient not taking: No sig reported), Disp: 25 tablet, Rfl: 3 .  ondansetron (ZOFRAN) 8 MG tablet, Take 1 tablet (8 mg total) by mouth 2 (two) times daily as needed (Nausea or vomiting). Start if needed on the third day after cisplatin. (Patient not taking: Reported on 08/22/2020), Disp: 30 tablet, Rfl: 1 .  prochlorperazine (COMPAZINE) 10 MG tablet, Take 1 tablet (10 mg total) by mouth every 6 (six) hours as needed (Nausea or vomiting). (Patient not taking: Reported on 08/22/2020), Disp: 30 tablet, Rfl: 1 .  ramipril (ALTACE)  10 MG capsule, TAKE TWO CAPSULES BY MOUTH DAILY, Disp: 180 capsule, Rfl: 3 .  rosuvastatin (CRESTOR) 40 MG tablet, TAKE ONE TABLET BY MOUTH EVERY NIGHT AT BEDTIME, Disp: 90 tablet, Rfl: 2 .  sertraline (ZOLOFT) 50 MG tablet, Take 50 mg by mouth in the morning and at bedtime. , Disp: , Rfl:  .  spironolactone (ALDACTONE) 25 MG tablet, TAKE ONE TABLET BY MOUTH DAILY, Disp: 90 tablet, Rfl: 3 No current facility-administered medications for this visit.  Facility-Administered Medications Ordered in Other Visits:  .  pembrolizumab (KEYTRUDA) 200 mg in sodium chloride 0.9 % 50 mL chemo infusion, 200 mg, Intravenous, Once, [name], MD, Last Rate: 116 mL/hr at 11/28/20  1020, 200 mg at 11/28/20 1020 .  PEMEtrexed (ALIMTA) 1,100 mg in sodium chloride 0.9 % 100 mL chemo infusion, 500 mg/m2 (Treatment Plan Recorded), Intravenous, Once, , Rudell Cobb, MD  Allergies:  Allergies  Allergen Reactions  . Amlodipine Swelling    Swelling of ankles    Past Medical History, Surgical history, Social history, and Family History were reviewed and updated.  Review of Systems: Review of Systems  Constitutional: Negative.   HENT:  Negative.   Eyes: Negative.   Respiratory: Negative.   Cardiovascular: Negative.   Gastrointestinal: Positive for constipation.  Endocrine: Negative.   Genitourinary: Negative.    Musculoskeletal: Negative.   Skin: Negative.   Neurological: Negative.   Hematological: Negative.   Psychiatric/Behavioral: Negative.     Physical Exam:  weight is 207 lb (93.9 kg). His blood pressure is 107/89.   Wt Readings from Last 3 Encounters:  11/28/20 207 lb (93.9 kg)  10/25/20 204 lb 8 oz (92.8 kg)  10/04/20 204 lb (92.5 kg)    Physical Exam Vitals reviewed.  HENT:     Head: Normocephalic and atraumatic.  Eyes:     Pupils: Pupils are equal, round, and reactive to light.  Cardiovascular:     Rate and Rhythm: Normal rate and regular rhythm.     Heart sounds: Normal heart sounds.  Pulmonary:     Effort: Pulmonary effort is normal.     Breath sounds: Normal breath sounds.  Abdominal:     General: Bowel sounds are normal.     Palpations: Abdomen is soft.  Musculoskeletal:        General: No tenderness or deformity. Normal range of motion.     Cervical back: Normal range of motion.  Lymphadenopathy:     Cervical: No cervical adenopathy.  Skin:    General: Skin is warm and dry.     Findings: No erythema or rash.  Neurological:     Mental Status: He is alert and oriented to person, place, and time.  Psychiatric:        Behavior: Behavior normal.        Thought Content: Thought content normal.        Judgment: Judgment normal.       Lab Results  Component Value Date   WBC 5.8 11/28/2020   HGB 10.9 (L) 11/28/2020   HCT 33.8 (L) 11/28/2020   MCV 100.3 (H) 11/28/2020   PLT 183 11/28/2020     Chemistry      Component Value Date/Time   NA 140 11/28/2020 0745   NA 141 02/05/2020 1425   K 4.0 11/28/2020 0745   CL 105 11/28/2020 0745   CO2 25 11/28/2020 0745   BUN 20 11/28/2020 0745   BUN 21 02/05/2020 1425   CREATININE 0.93 11/28/2020 0745  CREATININE 1.01 03/26/2016 1009      Component Value Date/Time   CALCIUM 9.8 11/28/2020 0745   ALKPHOS 71 11/28/2020 0745   AST 23 11/28/2020 0745   ALT 15 11/28/2020 0745   BILITOT 0.3 11/28/2020 0745      Impression and Plan: Mr. Bulnes is a very nice 75 year old white male.  He certainly looks a lot younger.  He has an atypical stage IV lung cancer which is disease has been resected.  We will go ahead with our maintenance therapy now.  Again, I really think that Alimta along with pembrolizumab would be reasonable.  I would think about 4 cycles of treatment and then we could just go with pembrolizumab by itself.  I would probably repeat a PET scan after the 4 cycles.  We will plan to get her back in 3 weeks for his second cycle of treatment.  [name], MD 5/12/202210:35 AM

## 2020-11-28 NOTE — Patient Instructions (Signed)
[facility]  Discharge Instructions: Thank you for choosing [facility] to provide your oncology and hematology care.   If you have a lab appointment with the [facility], please go directly to the [facility] and check in at the registration area.  Wear comfortable clothing and clothing appropriate for easy access to any Portacath or PICC line.   We strive to give you quality time with your provider. You may need to reschedule your appointment if you arrive late (15 or more minutes).  Arriving late affects you and other patients whose appointments are after yours.  Also, if you miss three or more appointments without notifying the office, you may be dismissed from the clinic at the provider's discretion.      For prescription refill requests, have your pharmacy contact our office and allow 72 hours for refills to be completed.    Today you received the following chemotherapy and/or immunotherapy agents Alimta, Keytruda       To help prevent nausea and vomiting after your treatment, we encourage you to take your nausea medication as directed.  BELOW ARE SYMPTOMS THAT SHOULD BE REPORTED IMMEDIATELY: . *FEVER GREATER THAN 100.4 F (38 C) OR HIGHER . *CHILLS OR SWEATING . *NAUSEA AND VOMITING THAT IS NOT CONTROLLED WITH YOUR NAUSEA MEDICATION . *UNUSUAL SHORTNESS OF BREATH . *UNUSUAL BRUISING OR BLEEDING . *URINARY PROBLEMS (pain or burning when urinating, or frequent urination) . *BOWEL PROBLEMS (unusual diarrhea, constipation, pain near the anus) . TENDERNESS IN MOUTH AND THROAT WITH OR WITHOUT PRESENCE OF ULCERS (sore throat, sores in mouth, or a toothache) . UNUSUAL RASH, SWELLING OR PAIN  . UNUSUAL VAGINAL DISCHARGE OR ITCHING   Items with * indicate a potential emergency and should be followed up as soon as possible or go to the Emergency Department if any problems should occur.  Please show the CHEMOTHERAPY ALERT CARD or IMMUNOTHERAPY  ALERT CARD at check-in to the Emergency Department and triage nurse. Should you have questions after your visit or need to cancel or reschedule your appointment, please contact [facility]  [phone] and follow the prompts.  Office hours are 8:00 a.m. to 4:30 p.m. Monday - Friday. Please note that voicemails left after 4:00 p.m. may not be returned until the following business day.  We are closed weekends and major holidays. You have access to a nurse at all times for urgent questions. Please call the main number to the clinic [phone] and follow the prompts.  For any non-urgent questions, you may also contact your provider using MyChart. We now offer e-Visits for anyone 59 and older to request care online for non-urgent symptoms. For details visit [email or web address].   Also download the MyChart app! Go to the app store, search "MyChart", open the app, select [facility], and log in with your MyChart username and password.  Due to Covid, a mask is required upon entering the hospital/clinic. If you do not have a mask, one will be given to you upon arrival. For doctor visits, patients may have 1 support person aged 11 or older with them. For treatment visits, patients cannot have anyone with them due to current Covid guidelines and our immunocompromised population.

## 2020-11-29 ENCOUNTER — Telehealth: Payer: Self-pay | Admitting: *Deleted

## 2020-11-29 LAB — T4: T4, Total: 5.6 ug/dL (ref 4.5–12.0)

## 2020-11-29 NOTE — Telephone Encounter (Signed)
Per 11/28/20 los - called and lvm of upcoming appointments - mailed calendar

## 2020-12-03 ENCOUNTER — Encounter: Payer: Self-pay | Admitting: *Deleted

## 2020-12-04 ENCOUNTER — Other Ambulatory Visit: Payer: Self-pay | Admitting: *Deleted

## 2020-12-04 DIAGNOSIS — C3492 Malignant neoplasm of unspecified part of left bronchus or lung: Secondary | ICD-10-CM

## 2020-12-04 MED ORDER — METHYLPREDNISOLONE 4 MG PO TBPK: ORAL_TABLET | ORAL | 0 refills | Status: DC

## 2020-12-18 ENCOUNTER — Other Ambulatory Visit: Payer: Self-pay | Admitting: Cardiology

## 2020-12-18 NOTE — Telephone Encounter (Signed)
Spoke with the patient and apologized as today was the first time that I became aware of his message regarding his breathing issues. Patient states that he has just had some more difficulty breathing with exertion lately but otherwise feels pretty good. He states that he had part of his upper lung removed and has gone through a lot of chemo due to lung cancer. The patient denies any chest pain. Reports some occasional dizziness. Patient has a yearly follow up scheduled for July. He will call us if he has any new or worsening symptoms/concerns prior to then.

## 2020-12-19 ENCOUNTER — Telehealth: Payer: Self-pay

## 2020-12-19 ENCOUNTER — Encounter: Payer: Self-pay | Admitting: *Deleted

## 2020-12-19 ENCOUNTER — Encounter: Payer: Self-pay | Admitting: Hematology & Oncology

## 2020-12-19 ENCOUNTER — Inpatient Hospital Stay: Payer: Medicare Other | Attending: Hematology & Oncology

## 2020-12-19 ENCOUNTER — Other Ambulatory Visit: Payer: Self-pay

## 2020-12-19 ENCOUNTER — Inpatient Hospital Stay: Payer: Medicare Other

## 2020-12-19 ENCOUNTER — Inpatient Hospital Stay (HOSPITAL_BASED_OUTPATIENT_CLINIC_OR_DEPARTMENT_OTHER): Payer: Medicare Other | Admitting: Hematology & Oncology

## 2020-12-19 VITALS — BP 105/58 | HR 66 | Temp 98.8°F | Resp 18 | Wt 201.0 lb

## 2020-12-19 DIAGNOSIS — C3492 Malignant neoplasm of unspecified part of left bronchus or lung: Secondary | ICD-10-CM

## 2020-12-19 DIAGNOSIS — C3412 Malignant neoplasm of upper lobe, left bronchus or lung: Secondary | ICD-10-CM | POA: Insufficient documentation

## 2020-12-19 DIAGNOSIS — R059 Cough, unspecified: Secondary | ICD-10-CM | POA: Insufficient documentation

## 2020-12-19 DIAGNOSIS — R110 Nausea: Secondary | ICD-10-CM | POA: Diagnosis not present

## 2020-12-19 DIAGNOSIS — Z79899 Other long term (current) drug therapy: Secondary | ICD-10-CM | POA: Diagnosis not present

## 2020-12-19 DIAGNOSIS — Z5111 Encounter for antineoplastic chemotherapy: Secondary | ICD-10-CM | POA: Diagnosis not present

## 2020-12-19 DIAGNOSIS — Z5112 Encounter for antineoplastic immunotherapy: Secondary | ICD-10-CM | POA: Diagnosis not present

## 2020-12-19 LAB — CMP (CANCER CENTER ONLY)
ALT: 22 U/L (ref 0–44)
AST: 25 U/L (ref 15–41)
Albumin: 4.1 g/dL (ref 3.5–5.0)
Alkaline Phosphatase: 70 U/L (ref 38–126)
Anion gap: 9 (ref 5–15)
BUN: 21 mg/dL (ref 8–23)
CO2: 27 mmol/L (ref 22–32)
Calcium: 9.8 mg/dL (ref 8.9–10.3)
Chloride: 102 mmol/L (ref 98–111)
Creatinine: 0.94 mg/dL (ref 0.61–1.24)
GFR, Estimated: >60 mL/min (ref >60)
Glucose, Bld: 98 mg/dL (ref 70–99)
Potassium: 4 mmol/L (ref 3.5–5.1)
Sodium: 138 mmol/L (ref 135–145)
Total Bilirubin: 0.3 mg/dL (ref 0.3–1.2)
Total Protein: 7 g/dL (ref 6.5–8.1)

## 2020-12-19 LAB — CBC WITH DIFFERENTIAL (CANCER CENTER ONLY)
Abs Immature Granulocytes: 0.02 10*3/uL (ref 0.00–0.07)
Basophils Absolute: 0 10*3/uL (ref 0.0–0.1)
Basophils Relative: 0 %
Eosinophils Absolute: 0.1 10*3/uL (ref 0.0–0.5)
Eosinophils Relative: 1 %
HCT: 29.7 % — ABNORMAL LOW (ref 39.0–52.0)
Hemoglobin: 9.5 g/dL — ABNORMAL LOW (ref 13.0–17.0)
Immature Granulocytes: 0 %
Lymphocytes Relative: 9 %
Lymphs Abs: 0.5 10*3/uL — ABNORMAL LOW (ref 0.7–4.0)
MCH: 32 pg (ref 26.0–34.0)
MCHC: 32 g/dL (ref 30.0–36.0)
MCV: 100 fL (ref 80.0–100.0)
Monocytes Absolute: 0.7 10*3/uL (ref 0.1–1.0)
Monocytes Relative: 12 %
Neutro Abs: 4.4 10*3/uL (ref 1.7–7.7)
Neutrophils Relative %: 78 %
Platelet Count: 216 10*3/uL (ref 150–400)
RBC: 2.97 MIL/uL — ABNORMAL LOW (ref 4.22–5.81)
RDW: 15.7 % — ABNORMAL HIGH (ref 11.5–15.5)
WBC Count: 5.7 10*3/uL (ref 4.0–10.5)
nRBC: 0 % (ref 0.0–0.2)

## 2020-12-19 LAB — TSH: TSH: 2.062 u[IU]/mL (ref 0.350–4.500)

## 2020-12-19 LAB — LACTATE DEHYDROGENASE: LDH: 181 U/L (ref 98–192)

## 2020-12-19 MED ORDER — SODIUM CHLORIDE 0.9 % IV SOLN
400.0000 mg/m2 | Freq: Once | INTRAVENOUS | Status: AC
  Administered 2020-12-19: 900 mg via INTRAVENOUS
  Filled 2020-12-19: qty 20

## 2020-12-19 MED ORDER — SODIUM CHLORIDE 0.9 % IV SOLN
150.0000 mg | Freq: Once | INTRAVENOUS | Status: AC
  Administered 2020-12-19: 150 mg via INTRAVENOUS
  Filled 2020-12-19: qty 150

## 2020-12-19 MED ORDER — SODIUM CHLORIDE 0.9 % IV SOLN
Freq: Once | INTRAVENOUS | Status: AC
Start: 2020-12-19 — End: 2020-12-19
  Filled 2020-12-19: qty 250

## 2020-12-19 MED ORDER — CYANOCOBALAMIN 1000 MCG/ML IJ SOLN
INTRAMUSCULAR | Status: AC
  Filled 2020-12-19: qty 1

## 2020-12-19 MED ORDER — PALONOSETRON HCL INJECTION 0.25 MG/5ML
INTRAVENOUS | Status: AC
  Filled 2020-12-19: qty 5

## 2020-12-19 MED ORDER — SODIUM CHLORIDE 0.9 % IV SOLN
200.0000 mg | Freq: Once | INTRAVENOUS | Status: AC
  Administered 2020-12-19: 200 mg via INTRAVENOUS
  Filled 2020-12-19: qty 8

## 2020-12-19 MED ORDER — PALONOSETRON HCL INJECTION 0.25 MG/5ML
0.2500 mg | Freq: Once | INTRAVENOUS | Status: AC
  Administered 2020-12-19: 0.25 mg via INTRAVENOUS

## 2020-12-19 MED ORDER — SODIUM CHLORIDE 0.9 % IV SOLN
10.0000 mg | Freq: Once | INTRAVENOUS | Status: AC
  Administered 2020-12-19: 10 mg via INTRAVENOUS
  Filled 2020-12-19: qty 10

## 2020-12-19 MED ORDER — CYANOCOBALAMIN 1000 MCG/ML IJ SOLN
1000.0000 ug | Freq: Once | INTRAMUSCULAR | Status: AC
  Administered 2020-12-19: 1000 ug via INTRAMUSCULAR

## 2020-12-19 NOTE — Patient Instructions (Signed)
Pembrolizumab injection What is this medicine? PEMBROLIZUMAB (pem broe liz ue mab) is a monoclonal antibody. It is used to treat certain types of cancer. This medicine may be used for other purposes; ask your health care provider or pharmacist if you have questions. COMMON BRAND NAME(S): Keytruda What should I tell my health care provider before I take this medicine? They need to know if you have any of these conditions:  autoimmune diseases like Crohn's disease, ulcerative colitis, or lupus  have had or planning to have an allogeneic stem cell transplant (uses someone else's stem cells)  history of organ transplant  history of chest radiation  nervous system problems like myasthenia gravis or Guillain-Barre syndrome  an unusual or allergic reaction to pembrolizumab, other medicines, foods, dyes, or preservatives  pregnant or trying to get pregnant  breast-feeding How should I use this medicine? This medicine is for infusion into a vein. It is given by a health care professional in a hospital or clinic setting. A special MedGuide will be given to you before each treatment. Be sure to read this information carefully each time. Talk to your pediatrician regarding the use of this medicine in children. While this drug may be prescribed for children as young as 6 months for selected conditions, precautions do apply. Overdosage: If you think you have taken too much of this medicine contact a poison control center or emergency room at once. NOTE: This medicine is only for you. Do not share this medicine with others. What if I miss a dose? It is important not to miss your dose. Call your doctor or health care professional if you are unable to keep an appointment. What may interact with this medicine? Interactions have not been studied. This list may not describe all possible interactions. Give your health care provider a list of all the medicines, herbs, non-prescription drugs, or dietary  supplements you use. Also tell them if you smoke, drink alcohol, or use illegal drugs. Some items may interact with your medicine. What should I watch for while using this medicine? Your condition will be monitored carefully while you are receiving this medicine. You may need blood work done while you are taking this medicine. Do not become pregnant while taking this medicine or for 4 months after stopping it. Women should inform their doctor if they wish to become pregnant or think they might be pregnant. There is a potential for serious side effects to an unborn child. Talk to your health care professional or pharmacist for more information. Do not breast-feed an infant while taking this medicine or for 4 months after the last dose. What side effects may I notice from receiving this medicine? Side effects that you should report to your doctor or health care professional as soon as possible:  allergic reactions like skin rash, itching or hives, swelling of the face, lips, or tongue  bloody or black, tarry  breathing problems  changes in vision  chest pain  chills  confusion  constipation  cough  diarrhea  dizziness or feeling faint or lightheaded  fast or irregular heartbeat  fever  flushing  joint pain  low blood counts - this medicine may decrease the number of white blood cells, red blood cells and platelets. You may be at increased risk for infections and bleeding.  muscle pain  muscle weakness  pain, tingling, numbness in the hands or feet  persistent headache  redness, blistering, peeling or loosening of the skin, including inside the mouth  signs and  symptoms of high blood sugar such as dizziness; dry mouth; dry skin; fruity breath; nausea; stomach pain; increased hunger or thirst; increased urination  signs and symptoms of kidney injury like trouble passing urine or change in the amount of urine  signs and symptoms of liver injury like dark urine,  light-colored stools, loss of appetite, nausea, right upper belly pain, yellowing of the eyes or skin  sweating  swollen lymph nodes  weight loss Side effects that usually do not require medical attention (report to your doctor or health care professional if they continue or are bothersome):  decreased appetite  hair loss  tiredness This list may not describe all possible side effects. Call your doctor for medical advice about side effects. You may report side effects to FDA at 1-800-FDA-1088. Where should I keep my medicine? This drug is given in a hospital or clinic and will not be stored at home. NOTE: This sheet is a summary. It may not cover all possible information. If you have questions about this medicine, talk to your doctor, pharmacist, or health care provider.  2021 Elsevier/Gold Standard (2019-06-07 21:44:53) Pemetrexed injection What is this medicine? PEMETREXED (PEM e TREX ed) is a chemotherapy drug used to treat lung cancers like non-small cell lung cancer and mesothelioma. It may also be used to treat other cancers. This medicine may be used for other purposes; ask your health care provider or pharmacist if you have questions. COMMON BRAND NAME(S): Alimta What should I tell my health care provider before I take this medicine? They need to know if you have any of these conditions:  infection (especially a virus infection such as chickenpox, cold sores, or herpes)  kidney disease  low blood counts, like low white cell, platelet, or red cell counts  lung or breathing disease, like asthma  radiation therapy  an unusual or allergic reaction to pemetrexed, other medicines, foods, dyes, or preservative  pregnant or trying to get pregnant  breast-feeding How should I use this medicine? This drug is given as an infusion into a vein. It is administered in a hospital or clinic by a specially trained health care professional. Talk to your pediatrician regarding the  use of this medicine in children. Special care may be needed. Overdosage: If you think you have taken too much of this medicine contact a poison control center or emergency room at once. NOTE: This medicine is only for you. Do not share this medicine with others. What if I miss a dose? It is important not to miss your dose. Call your doctor or health care professional if you are unable to keep an appointment. What may interact with this medicine? This medicine may interact with the following medications:  Ibuprofen This list may not describe all possible interactions. Give your health care provider a list of all the medicines, herbs, non-prescription drugs, or dietary supplements you use. Also tell them if you smoke, drink alcohol, or use illegal drugs. Some items may interact with your medicine. What should I watch for while using this medicine? Visit your doctor for checks on your progress. This drug may make you feel generally unwell. This is not uncommon, as chemotherapy can affect healthy cells as well as cancer cells. Report any side effects. Continue your course of treatment even though you feel ill unless your doctor tells you to stop. In some cases, you may be given additional medicines to help with side effects. Follow all directions for their use. Call your doctor or health care  professional for advice if you get a fever, chills or sore throat, or other symptoms of a cold or flu. Do not treat yourself. This drug decreases your body's ability to fight infections. Try to avoid being around people who are sick. This medicine may increase your risk to bruise or bleed. Call your doctor or health care professional if you notice any unusual bleeding. Be careful brushing and flossing your teeth or using a toothpick because you may get an infection or bleed more easily. If you have any dental work done, tell your dentist you are receiving this medicine. Avoid taking products that contain aspirin,  acetaminophen, ibuprofen, naproxen, or ketoprofen unless instructed by your doctor. These medicines may hide a fever. Call your doctor or health care professional if you get diarrhea or mouth sores. Do not treat yourself. To protect your kidneys, drink water or other fluids as directed while you are taking this medicine. Do not become pregnant while taking this medicine or for 6 months after stopping it. Women should inform their doctor if they wish to become pregnant or think they might be pregnant. Men should not father a child while taking this medicine and for 3 months after stopping it. This may interfere with the ability to father a child. You should talk to your doctor or health care professional if you are concerned about your fertility. There is a potential for serious side effects to an unborn child. Talk to your health care professional or pharmacist for more information. Do not breast-feed an infant while taking this medicine or for 1 week after stopping it. What side effects may I notice from receiving this medicine? Side effects that you should report to your doctor or health care professional as soon as possible:  allergic reactions like skin rash, itching or hives, swelling of the face, lips, or tongue  breathing problems  redness, blistering, peeling or loosening of the skin, including inside the mouth  signs and symptoms of bleeding such as bloody or black, tarry stools; red or dark-brown urine; spitting up blood or brown material that looks like coffee grounds; red spots on the skin; unusual bruising or bleeding from the eye, gums, or nose  signs and symptoms of infection like fever or chills; cough; sore throat; pain or trouble passing urine  signs and symptoms of kidney injury like trouble passing urine or change in the amount of urine  signs and symptoms of liver injury like dark yellow or brown urine; general ill feeling or flu-like symptoms; light-colored stools; loss of  appetite; nausea; right upper belly pain; unusually weak or tired; yellowing of the eyes or skin Side effects that usually do not require medical attention (report to your doctor or health care professional if they continue or are bothersome):  constipation  mouth sores  nausea, vomiting  unusually weak or tired This list may not describe all possible side effects. Call your doctor for medical advice about side effects. You may report side effects to FDA at 1-800-FDA-1088. Where should I keep my medicine? This drug is given in a hospital or clinic and will not be stored at home. NOTE: This sheet is a summary. It may not cover all possible information. If you have questions about this medicine, talk to your doctor, pharmacist, or health care provider.  2021 Elsevier/Gold Standard (2017-08-25 16:11:33)

## 2020-12-19 NOTE — Progress Notes (Signed)
Hematology and Oncology Follow Up Visit  John Hinton [id number] [birth year] 75 y.o. 12/19/2020   Principle Diagnosis:   Stage IVA (T3N2N0) adenocarcinoma of the LUL -- resected -- (+) KRAS  Current Therapy:    S/P cycle #6 of Carbo/Alimta/Pembrolizumab  Alimta/pembrolizumab-maintenance therapy s/p cycle 1/4  - start on 11/28/2020     Interim History:  John Hinton is back for follow-up.  He looks fantastic.  He is doing okay.  He did have a tough time with the first cycle of treatment.  I am not sure exactly how much the treatment contributed to his symptoms.  He just had a lot of arthralgias and myalgias.  He had nausea.  He felt poorly for a few days.  He then "came out of it."  He has had no problems with cough.  There is no chest wall pain.  He has had no nausea or vomiting currently.  There is no change in bowel or bladder habits.  He has had no bleeding.  I would have to say that his performance status right now is ECOG 0.    Medications:  Current Outpatient Medications:  .  aspirin 81 MG tablet, Take 81 mg by mouth at bedtime. , Disp: , Rfl:  .  cholecalciferol (VITAMIN D) 1000 UNITS tablet, Take 1,000 Units by mouth at bedtime. , Disp: , Rfl:  .  clopidogrel (PLAVIX) 75 MG tablet, Take 1 tablet (75 mg total) by mouth daily., Disp: 90 tablet, Rfl: 3 .  Cyanocobalamin (B-12 PO), Take 1,000 mcg by mouth daily. , Disp: , Rfl:  .  dexamethasone (DECADRON) 4 MG tablet, Take 1 tab two times a day the day before Alimta chemo, then take 2 tabs once a day for 3 days starting the day after cisplatin., Disp: 30 tablet, Rfl: 1 .  doxazosin (CARDURA) 2 MG tablet, TAKE ONE TABLET BY MOUTH DAILY, Disp: 90 tablet, Rfl: 0 .  ezetimibe (ZETIA) 10 MG tablet, Take 1 tablet (10 mg total) by mouth daily., Disp: 90 tablet, Rfl: 3 .  folic acid (FOLVITE) 1 MG tablet, Take 1 tablet (1 mg total) by mouth daily. Start 5-7 days before Alimta chemotherapy. Continue until 21 days after Alimta completed.,  Disp: 100 tablet, Rfl: 3 .  folic acid (FOLVITE) 482 MCG tablet, Take 400 mcg by mouth daily., Disp: , Rfl:  .  methylPREDNISolone (MEDROL DOSEPAK) 4 MG TBPK tablet, Take as directed., Disp: 1 each, Rfl: 0 .  Multiple Vitamin (MULTIVITAMIN) tablet, Take 1 tablet by mouth daily., Disp: , Rfl:  .  nitroGLYCERIN (NITROSTAT) 0.4 MG SL tablet, Place 1 tablet (0.4 mg total) under the tongue every 5 (five) minutes as needed for chest pain. (Patient not taking: No sig reported), Disp: 25 tablet, Rfl: 3 .  ondansetron (ZOFRAN) 8 MG tablet, Take 1 tablet (8 mg total) by mouth 2 (two) times daily as needed (Nausea or vomiting). Start if needed on the third day after cisplatin. (Patient not taking: Reported on 08/22/2020), Disp: 30 tablet, Rfl: 1 .  prochlorperazine (COMPAZINE) 10 MG tablet, Take 1 tablet (10 mg total) by mouth every 6 (six) hours as needed (Nausea or vomiting). (Patient not taking: Reported on 08/22/2020), Disp: 30 tablet, Rfl: 1 .  ramipril (ALTACE) 10 MG capsule, TAKE TWO CAPSULES BY MOUTH DAILY, Disp: 180 capsule, Rfl: 3 .  rosuvastatin (CRESTOR) 40 MG tablet, TAKE ONE TABLET BY MOUTH EVERY NIGHT AT BEDTIME, Disp: 90 tablet, Rfl: 0 .  sertraline (ZOLOFT) 50 MG tablet, Take 50  mg by mouth in the morning and at bedtime. , Disp: , Rfl:  .  spironolactone (ALDACTONE) 25 MG tablet, TAKE ONE TABLET BY MOUTH DAILY, Disp: 90 tablet, Rfl: 3  Allergies:  Allergies  Allergen Reactions  . Amlodipine Swelling    Swelling of ankles    Past Medical History, Surgical history, Social history, and Family History were reviewed and updated.  Review of Systems: Review of Systems  Constitutional: Negative.   HENT:  Negative.   Eyes: Negative.   Respiratory: Negative.   Cardiovascular: Negative.   Gastrointestinal: Positive for constipation.  Endocrine: Negative.   Genitourinary: Negative.    Musculoskeletal: Negative.   Skin: Negative.   Neurological: Negative.   Hematological: Negative.    Psychiatric/Behavioral: Negative.     Physical Exam:  weight is 91.2 kg. His oral temperature is 98.8 F (37.1 C). His blood pressure is 105/58 (abnormal) and his pulse is 66. His respiration is 18 and oxygen saturation is 98%.   Wt Readings from Last 3 Encounters:  12/19/20 91.2 kg  11/28/20 93.9 kg  10/25/20 92.8 kg    Physical Exam Vitals reviewed.  HENT:     Head: Normocephalic and atraumatic.  Eyes:     Pupils: Pupils are equal, round, and reactive to light.  Cardiovascular:     Rate and Rhythm: Normal rate and regular rhythm.     Heart sounds: Normal heart sounds.  Pulmonary:     Effort: Pulmonary effort is normal.     Breath sounds: Normal breath sounds.  Abdominal:     General: Bowel sounds are normal.     Palpations: Abdomen is soft.  Musculoskeletal:        General: No tenderness or deformity. Normal range of motion.     Cervical back: Normal range of motion.  Lymphadenopathy:     Cervical: No cervical adenopathy.  Skin:    General: Skin is warm and dry.     Findings: No erythema or rash.  Neurological:     Mental Status: He is alert and oriented to person, place, and time.  Psychiatric:        Behavior: Behavior normal.        Thought Content: Thought content normal.        Judgment: Judgment normal.      Lab Results  Component Value Date   WBC 5.7 12/19/2020   HGB 9.5 (L) 12/19/2020   HCT 29.7 (L) 12/19/2020   MCV 100.0 12/19/2020   PLT 216 12/19/2020     Chemistry      Component Value Date/Time   NA 138 12/19/2020 1153   NA 141 02/05/2020 1425   K 4.0 12/19/2020 1153   CL 102 12/19/2020 1153   CO2 27 12/19/2020 1153   BUN 21 12/19/2020 1153   BUN 21 02/05/2020 1425   CREATININE 0.94 12/19/2020 1153   CREATININE 1.01 03/26/2016 1009      Component Value Date/Time   CALCIUM 9.8 12/19/2020 1153   ALKPHOS 70 12/19/2020 1153   AST 25 12/19/2020 1153   ALT 22 12/19/2020 1153   BILITOT 0.3 12/19/2020 1153      Impression and  Plan: John Hinton is a very nice 75 year old white male.  He certainly looks a lot younger.  He has an atypical stage IV lung cancer which is disease has been resected.  We will go ahead with the second cycle of treatment.  I will give him 4 cycles of Alimta/pembrolizumab and then we will just go  with maintenance pembrolizumab.  Hopefully, he will do better with this cycle of treatment.  We will still follow him up in 3 weeks.   [name], MD 6/2/20221:26 PM

## 2020-12-19 NOTE — Telephone Encounter (Signed)
appts made per 12/19/20 los and pt will gain a new sch in tx/avs    John Hinton

## 2020-12-19 NOTE — Progress Notes (Signed)
Oncology Nurse Navigator Documentation  Oncology Nurse Navigator Flowsheets 12/19/2020  Abnormal Finding Date -  Confirmed Diagnosis Date -  Diagnosis Status -  Planned Course of Treatment -  Phase of Treatment -  Chemotherapy Actual Start Date: -  Chemotherapy Actual End Date: -  Surgery Actual Start Date: -  Navigator Follow Up Date: 01/10/2021  Navigator Follow Up Reason: Follow-up Appointment;Chemotherapy  Navigator Location [facility]  Referral Date to RadOnc/MedOnc -  Navigator Encounter Type Treatment;Appt/Treatment Plan Review  Telephone -  Treatment Initiated Date -  Patient Visit Type MedOnc  Treatment Phase Active Tx  Barriers/Navigation Needs Coordination of Care;Education  Education -  Interventions Psycho-Social Support  Acuity Level 2-Minimal Needs (1-2 Barriers Identified)  Coordination of Care -  Education Method -  Support Groups/Services Friends and Family  Time Spent with Patient 15

## 2020-12-20 LAB — T4: T4, Total: 5.4 ug/dL (ref 4.5–12.0)

## 2020-12-23 ENCOUNTER — Encounter: Payer: Self-pay | Admitting: *Deleted

## 2020-12-25 ENCOUNTER — Other Ambulatory Visit: Payer: Self-pay | Admitting: *Deleted

## 2020-12-25 DIAGNOSIS — C3492 Malignant neoplasm of unspecified part of left bronchus or lung: Secondary | ICD-10-CM

## 2020-12-25 DIAGNOSIS — R918 Other nonspecific abnormal finding of lung field: Secondary | ICD-10-CM

## 2020-12-26 ENCOUNTER — Inpatient Hospital Stay: Payer: Medicare Other

## 2020-12-26 ENCOUNTER — Other Ambulatory Visit: Payer: Self-pay

## 2020-12-26 VITALS — BP 100/52 | HR 81 | Temp 99.2°F | Resp 18

## 2020-12-26 DIAGNOSIS — R110 Nausea: Secondary | ICD-10-CM | POA: Diagnosis not present

## 2020-12-26 DIAGNOSIS — Z5112 Encounter for antineoplastic immunotherapy: Secondary | ICD-10-CM | POA: Diagnosis not present

## 2020-12-26 DIAGNOSIS — C3412 Malignant neoplasm of upper lobe, left bronchus or lung: Secondary | ICD-10-CM | POA: Diagnosis not present

## 2020-12-26 DIAGNOSIS — Z5111 Encounter for antineoplastic chemotherapy: Secondary | ICD-10-CM | POA: Diagnosis not present

## 2020-12-26 DIAGNOSIS — E860 Dehydration: Secondary | ICD-10-CM

## 2020-12-26 DIAGNOSIS — R059 Cough, unspecified: Secondary | ICD-10-CM | POA: Diagnosis not present

## 2020-12-26 DIAGNOSIS — Z79899 Other long term (current) drug therapy: Secondary | ICD-10-CM | POA: Diagnosis not present

## 2020-12-26 DIAGNOSIS — R918 Other nonspecific abnormal finding of lung field: Secondary | ICD-10-CM

## 2020-12-26 DIAGNOSIS — C3492 Malignant neoplasm of unspecified part of left bronchus or lung: Secondary | ICD-10-CM

## 2020-12-26 LAB — CMP (CANCER CENTER ONLY)
ALT: 59 U/L — ABNORMAL HIGH (ref 0–44)
AST: 73 U/L — ABNORMAL HIGH (ref 15–41)
Albumin: 3.8 g/dL (ref 3.5–5.0)
Alkaline Phosphatase: 70 U/L (ref 38–126)
Anion gap: 10 (ref 5–15)
BUN: 19 mg/dL (ref 8–23)
CO2: 23 mmol/L (ref 22–32)
Calcium: 9.1 mg/dL (ref 8.9–10.3)
Chloride: 99 mmol/L (ref 98–111)
Creatinine: 1.09 mg/dL (ref 0.61–1.24)
GFR, Estimated: >60 mL/min (ref >60)
Glucose, Bld: 120 mg/dL — ABNORMAL HIGH (ref 70–99)
Potassium: 3.7 mmol/L (ref 3.5–5.1)
Sodium: 132 mmol/L — ABNORMAL LOW (ref 135–145)
Total Bilirubin: 0.5 mg/dL (ref 0.3–1.2)
Total Protein: 6.8 g/dL (ref 6.5–8.1)

## 2020-12-26 LAB — CBC WITH DIFFERENTIAL (CANCER CENTER ONLY)
Abs Immature Granulocytes: 0.05 10*3/uL (ref 0.00–0.07)
Basophils Absolute: 0 10*3/uL (ref 0.0–0.1)
Basophils Relative: 0 %
Eosinophils Absolute: 0 10*3/uL (ref 0.0–0.5)
Eosinophils Relative: 0 %
HCT: 25.7 % — ABNORMAL LOW (ref 39.0–52.0)
Hemoglobin: 8.3 g/dL — ABNORMAL LOW (ref 13.0–17.0)
Immature Granulocytes: 2 %
Lymphocytes Relative: 9 %
Lymphs Abs: 0.3 10*3/uL — ABNORMAL LOW (ref 0.7–4.0)
MCH: 31.9 pg (ref 26.0–34.0)
MCHC: 32.3 g/dL (ref 30.0–36.0)
MCV: 98.8 fL (ref 80.0–100.0)
Monocytes Absolute: 0.3 10*3/uL (ref 0.1–1.0)
Monocytes Relative: 12 %
Neutro Abs: 2.2 10*3/uL (ref 1.7–7.7)
Neutrophils Relative %: 77 %
Platelet Count: 115 10*3/uL — ABNORMAL LOW (ref 150–400)
RBC: 2.6 MIL/uL — ABNORMAL LOW (ref 4.22–5.81)
RDW: 15.3 % (ref 11.5–15.5)
WBC Count: 2.8 10*3/uL — ABNORMAL LOW (ref 4.0–10.5)
nRBC: 0.7 % — ABNORMAL HIGH (ref 0.0–0.2)

## 2020-12-26 LAB — SAMPLE TO BLOOD BANK

## 2020-12-26 MED ORDER — SODIUM CHLORIDE 0.9 % IV SOLN
INTRAVENOUS | Status: DC
  Filled 2020-12-26 (×2): qty 250

## 2020-12-26 NOTE — Patient Instructions (Signed)
Dehydration, Adult Dehydration is a condition in which there is not enough water or other fluids in the body. This happens when a person loses more fluids than he or she takes in. Important organs, such as the kidneys, brain, and heart, cannot function without a proper amount of fluids. Any loss of fluids from the body can lead to dehydration. Dehydration can be mild, moderate, or severe. It should be treated right away to prevent it from becoming severe. What are the causes? Dehydration may be caused by:  Conditions that cause loss of water or other fluids, such as diarrhea, vomiting, or sweating or urinating a lot.  Not drinking enough fluids, especially when you are ill or doing activities that require a lot of energy.  Other illnesses and conditions, such as fever or infection.  Certain medicines, such as medicines that remove excess fluid from the body (diuretics).  Lack of safe drinking water.  Not being able to get enough water and food. What increases the risk? The following factors may make you more likely to develop this condition:  Having a long-term (chronic) illness that has not been treated properly, such as diabetes, heart disease, or kidney disease.  Being 65 years of age or older.  Having a disability.  Living in a place that is high in altitude, where thinner, drier air causes more fluid loss.  Doing exercises that put stress on your body for a long time (endurance sports). What are the signs or symptoms? Symptoms of dehydration depend on how severe it is. Mild or moderate dehydration  Thirst.  Dry lips or dry mouth.  Dizziness or light-headedness, especially when standing up from a seated position.  Muscle cramps.  Dark urine. Urine may be the color of tea.  Less urine or tears produced than usual.  Headache. Severe dehydration  Changes in skin. Your skin may be cold and clammy, blotchy, or pale. Your skin also may not return to normal after being  lightly pinched and released.  Little or no tears, urine, or sweat.  Changes in vital signs, such as rapid breathing and low blood pressure. Your pulse may be weak or may be faster than 100 beats a minute when you are sitting still.  Other changes, such as: ? Feeling very thirsty. ? Sunken eyes. ? Cold hands and feet. ? Confusion. ? Being very tired (lethargic) or having trouble waking from sleep. ? Short-term weight loss. ? Loss of consciousness. How is this diagnosed? This condition is diagnosed based on your symptoms and a physical exam. You may have blood and urine tests to help confirm the diagnosis. How is this treated? Treatment for this condition depends on how severe it is. Treatment should be started right away. Do not wait until dehydration becomes severe. Severe dehydration is an emergency and needs to be treated in a hospital.  Mild or moderate dehydration can be treated at home. You may be asked to: ? Drink more fluids. ? Drink an oral rehydration solution (ORS). This drink helps restore proper amounts of fluids and salts and minerals in the blood (electrolytes).  Severe dehydration can be treated: ? With IV fluids. ? By correcting abnormal levels of electrolytes. This is often done by giving electrolytes through a tube that is passed through your nose and into your stomach (nasogastric tube, or NG tube). ? By treating the underlying cause of dehydration. Follow these instructions at home: Oral rehydration solution If told by your health care provider, drink an ORS:  Make   an ORS by following instructions on the package.  Start by drinking small amounts, about  cup (120 mL) every 5-10 minutes.  Slowly increase how much you drink until you have taken the amount recommended by your health care provider. Eating and drinking  Drink enough clear fluid to keep your urine pale yellow. If you were told to drink an ORS, finish the ORS first and then start slowly drinking  other clear fluids. Drink fluids such as: ? Water. Do not drink only water. Doing that can lead to hyponatremia, which is having too little salt (sodium) in the body. ? Water from ice chips you suck on. ? Fruit juice that you have added water to (diluted fruit juice). ? Low-calorie sports drinks.  Eat foods that contain a healthy balance of electrolytes, such as bananas, oranges, potatoes, tomatoes, and spinach.  Do not drink alcohol.  Avoid the following: ? Drinks that contain a lot of sugar. These include high-calorie sports drinks, fruit juice that is not diluted, and soda. ? Caffeine. ? Foods that are greasy or contain a lot of fat or sugar.         General instructions  Take over-the-counter and prescription medicines only as told by your health care provider.  Do not take sodium tablets. Doing that can lead to having too much sodium in the body (hypernatremia).  Return to your normal activities as told by your health care provider. Ask your health care provider what activities are safe for you.  Keep all follow-up visits as told by your health care provider. This is important. Contact a health care provider if:  You have muscle cramps, pain, or discomfort, such as: ? Pain in your abdomen and the pain gets worse or stays in one area (localizes). ? Stiff neck.  You have a rash.  You are more irritable than usual.  You are sleepier or have a harder time waking than usual.  You feel weak or dizzy.  You feel very thirsty. Get help right away if you have:  Any symptoms of severe dehydration.  Symptoms of vomiting, such as: ? You cannot eat or drink without vomiting. ? Vomiting gets worse or does not go away. ? Vomit includes blood or green matter (bile).  Symptoms that get worse with treatment.  A fever.  A severe headache.  Problems with urination or bowel movements, such as: ? Diarrhea that gets worse or does not go away. ? Blood in your stool (feces).  This may cause stool to look black and tarry. ? Not urinating, or urinating only a small amount of very dark urine, within 6-8 hours.  Trouble breathing. These symptoms may represent a serious problem that is an emergency. Do not wait to see if the symptoms will go away. Get medical help right away. Call your local emergency services (911 in the [place] Do not drive yourself to the hospital. Summary  Dehydration is a condition in which there is not enough water or other fluids in the body. This happens when a person loses more fluids than he or she takes in.  Treatment for this condition depends on how severe it is. Treatment should be started right away. Do not wait until dehydration becomes severe.  Drink enough clear fluid to keep your urine pale yellow. If you were told to drink an oral rehydration solution (ORS), finish the ORS first and then start slowly drinking other clear fluids.  Take over-the-counter and prescription medicines only as told by your health   care provider.  Get help right away if you have any symptoms of severe dehydration. This information is not intended to replace advice given to you by your health care provider. Make sure you discuss any questions you have with your health care provider. Document Revised: 02/16/2019 Document Reviewed: 02/16/2019 Elsevier Patient Education  2021 [employer] Inc[employer]  

## 2020-12-27 ENCOUNTER — Telehealth: Payer: Self-pay

## 2020-12-27 LAB — TSH: TSH: 2.966 u[IU]/mL (ref 0.320–4.118)

## 2020-12-27 LAB — T4: T4, Total: 6.1 ug/dL (ref 4.5–12.0)

## 2020-12-28 ENCOUNTER — Encounter: Payer: Self-pay | Admitting: Hematology & Oncology

## 2020-12-28 DIAGNOSIS — Z7189 Other specified counseling: Secondary | ICD-10-CM

## 2020-12-28 DIAGNOSIS — E860 Dehydration: Secondary | ICD-10-CM

## 2020-12-28 DIAGNOSIS — C3492 Malignant neoplasm of unspecified part of left bronchus or lung: Secondary | ICD-10-CM

## 2020-12-28 DIAGNOSIS — C3490 Malignant neoplasm of unspecified part of unspecified bronchus or lung: Secondary | ICD-10-CM

## 2020-12-28 DIAGNOSIS — D509 Iron deficiency anemia, unspecified: Secondary | ICD-10-CM

## 2020-12-30 ENCOUNTER — Encounter: Payer: Self-pay | Admitting: Hematology & Oncology

## 2021-01-02 ENCOUNTER — Ambulatory Visit: Payer: Medicare Other | Admitting: Cardiology

## 2021-01-08 DIAGNOSIS — I10 Essential (primary) hypertension: Secondary | ICD-10-CM | POA: Diagnosis not present

## 2021-01-08 DIAGNOSIS — I2510 Atherosclerotic heart disease of native coronary artery without angina pectoris: Secondary | ICD-10-CM | POA: Diagnosis not present

## 2021-01-08 DIAGNOSIS — I25119 Atherosclerotic heart disease of native coronary artery with unspecified angina pectoris: Secondary | ICD-10-CM | POA: Diagnosis not present

## 2021-01-08 DIAGNOSIS — N401 Enlarged prostate with lower urinary tract symptoms: Secondary | ICD-10-CM | POA: Diagnosis not present

## 2021-01-08 DIAGNOSIS — E7800 Pure hypercholesterolemia, unspecified: Secondary | ICD-10-CM | POA: Diagnosis not present

## 2021-01-10 ENCOUNTER — Ambulatory Visit (HOSPITAL_BASED_OUTPATIENT_CLINIC_OR_DEPARTMENT_OTHER)
Admission: RE | Admit: 2021-01-10 | Discharge: 2021-01-10 | Disposition: A | Discharge status: Home / Self Care | Payer: Medicare Other | Source: Ambulatory Visit | Attending: Hematology & Oncology | Admitting: Hematology & Oncology

## 2021-01-10 ENCOUNTER — Encounter: Payer: Self-pay | Admitting: *Deleted

## 2021-01-10 ENCOUNTER — Inpatient Hospital Stay: Payer: Medicare Other

## 2021-01-10 ENCOUNTER — Encounter: Payer: Self-pay | Admitting: Hematology & Oncology

## 2021-01-10 ENCOUNTER — Inpatient Hospital Stay (HOSPITAL_BASED_OUTPATIENT_CLINIC_OR_DEPARTMENT_OTHER): Payer: Medicare Other | Admitting: Hematology & Oncology

## 2021-01-10 ENCOUNTER — Ambulatory Visit (HOSPITAL_BASED_OUTPATIENT_CLINIC_OR_DEPARTMENT_OTHER): Admission: RE | Admit: 2021-01-10 | Payer: Medicare Other | Source: Ambulatory Visit

## 2021-01-10 ENCOUNTER — Other Ambulatory Visit: Payer: Self-pay

## 2021-01-10 VITALS — BP 129/65 | HR 75 | Temp 98.6°F | Resp 19 | Wt 196.0 lb

## 2021-01-10 DIAGNOSIS — C3412 Malignant neoplasm of upper lobe, left bronchus or lung: Secondary | ICD-10-CM | POA: Diagnosis not present

## 2021-01-10 DIAGNOSIS — Z79899 Other long term (current) drug therapy: Secondary | ICD-10-CM | POA: Diagnosis not present

## 2021-01-10 DIAGNOSIS — C3490 Malignant neoplasm of unspecified part of unspecified bronchus or lung: Secondary | ICD-10-CM

## 2021-01-10 DIAGNOSIS — C3492 Malignant neoplasm of unspecified part of left bronchus or lung: Secondary | ICD-10-CM

## 2021-01-10 DIAGNOSIS — J90 Pleural effusion, not elsewhere classified: Secondary | ICD-10-CM | POA: Diagnosis not present

## 2021-01-10 DIAGNOSIS — Z5111 Encounter for antineoplastic chemotherapy: Secondary | ICD-10-CM | POA: Diagnosis not present

## 2021-01-10 DIAGNOSIS — Z7189 Other specified counseling: Secondary | ICD-10-CM

## 2021-01-10 DIAGNOSIS — Z5112 Encounter for antineoplastic immunotherapy: Secondary | ICD-10-CM | POA: Diagnosis not present

## 2021-01-10 DIAGNOSIS — R059 Cough, unspecified: Secondary | ICD-10-CM

## 2021-01-10 DIAGNOSIS — D509 Iron deficiency anemia, unspecified: Secondary | ICD-10-CM

## 2021-01-10 DIAGNOSIS — R110 Nausea: Secondary | ICD-10-CM | POA: Diagnosis not present

## 2021-01-10 DIAGNOSIS — E860 Dehydration: Secondary | ICD-10-CM

## 2021-01-10 LAB — CMP (CANCER CENTER ONLY)
ALT: 52 U/L — ABNORMAL HIGH (ref 0–44)
AST: 28 U/L (ref 15–41)
Albumin: 3.7 g/dL (ref 3.5–5.0)
Alkaline Phosphatase: 98 U/L (ref 38–126)
Anion gap: 8 (ref 5–15)
BUN: 16 mg/dL (ref 8–23)
CO2: 26 mmol/L (ref 22–32)
Calcium: 9.1 mg/dL (ref 8.9–10.3)
Chloride: 104 mmol/L (ref 98–111)
Creatinine: 0.75 mg/dL (ref 0.61–1.24)
GFR, Estimated: >60 mL/min (ref >60)
Glucose, Bld: 98 mg/dL (ref 70–99)
Potassium: 3.8 mmol/L (ref 3.5–5.1)
Sodium: 138 mmol/L (ref 135–145)
Total Bilirubin: 0.3 mg/dL (ref 0.3–1.2)
Total Protein: 6.8 g/dL (ref 6.5–8.1)

## 2021-01-10 LAB — CBC WITH DIFFERENTIAL (CANCER CENTER ONLY)
Abs Immature Granulocytes: 0.03 10*3/uL (ref 0.00–0.07)
Basophils Absolute: 0 10*3/uL (ref 0.0–0.1)
Basophils Relative: 0 %
Eosinophils Absolute: 0 10*3/uL (ref 0.0–0.5)
Eosinophils Relative: 1 %
HCT: 28.2 % — ABNORMAL LOW (ref 39.0–52.0)
Hemoglobin: 8.8 g/dL — ABNORMAL LOW (ref 13.0–17.0)
Immature Granulocytes: 1 %
Lymphocytes Relative: 15 %
Lymphs Abs: 0.8 10*3/uL (ref 0.7–4.0)
MCH: 31.2 pg (ref 26.0–34.0)
MCHC: 31.2 g/dL (ref 30.0–36.0)
MCV: 100 fL (ref 80.0–100.0)
Monocytes Absolute: 0.7 10*3/uL (ref 0.1–1.0)
Monocytes Relative: 13 %
Neutro Abs: 3.8 10*3/uL (ref 1.7–7.7)
Neutrophils Relative %: 70 %
Platelet Count: 304 10*3/uL (ref 150–400)
RBC: 2.82 MIL/uL — ABNORMAL LOW (ref 4.22–5.81)
RDW: 14.9 % (ref 11.5–15.5)
WBC Count: 5.3 10*3/uL (ref 4.0–10.5)
nRBC: 0 % (ref 0.0–0.2)

## 2021-01-10 LAB — LACTATE DEHYDROGENASE: LDH: 182 U/L (ref 98–192)

## 2021-01-10 IMAGING — DX DG CHEST 2V
2 series · 2 of 2 positions shown · non-contrast
Comparison: [DATE]

CLINICAL DATA: History of lung cancer.

EXAM:
CHEST - 2 VIEW

[chest pa]
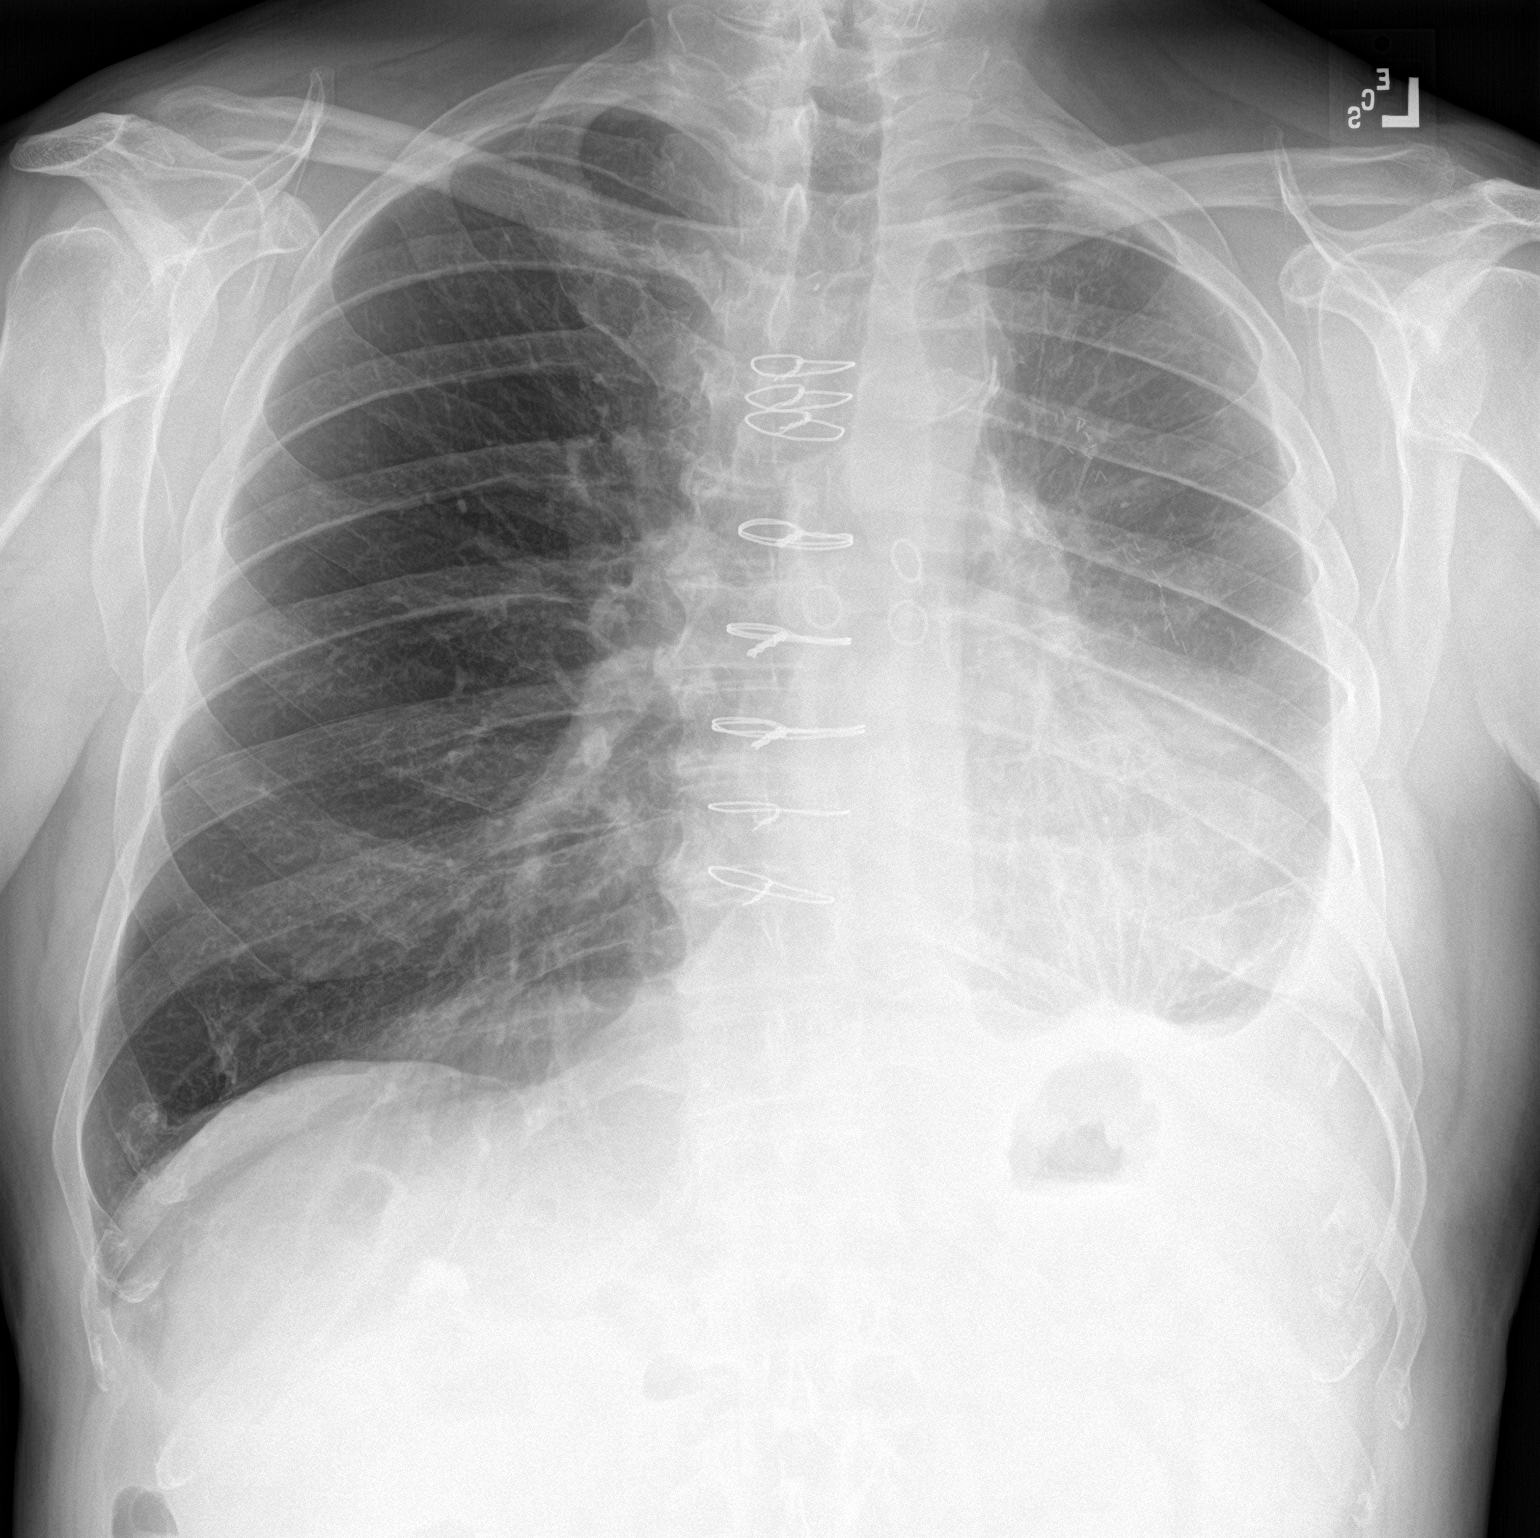

[chest lat]
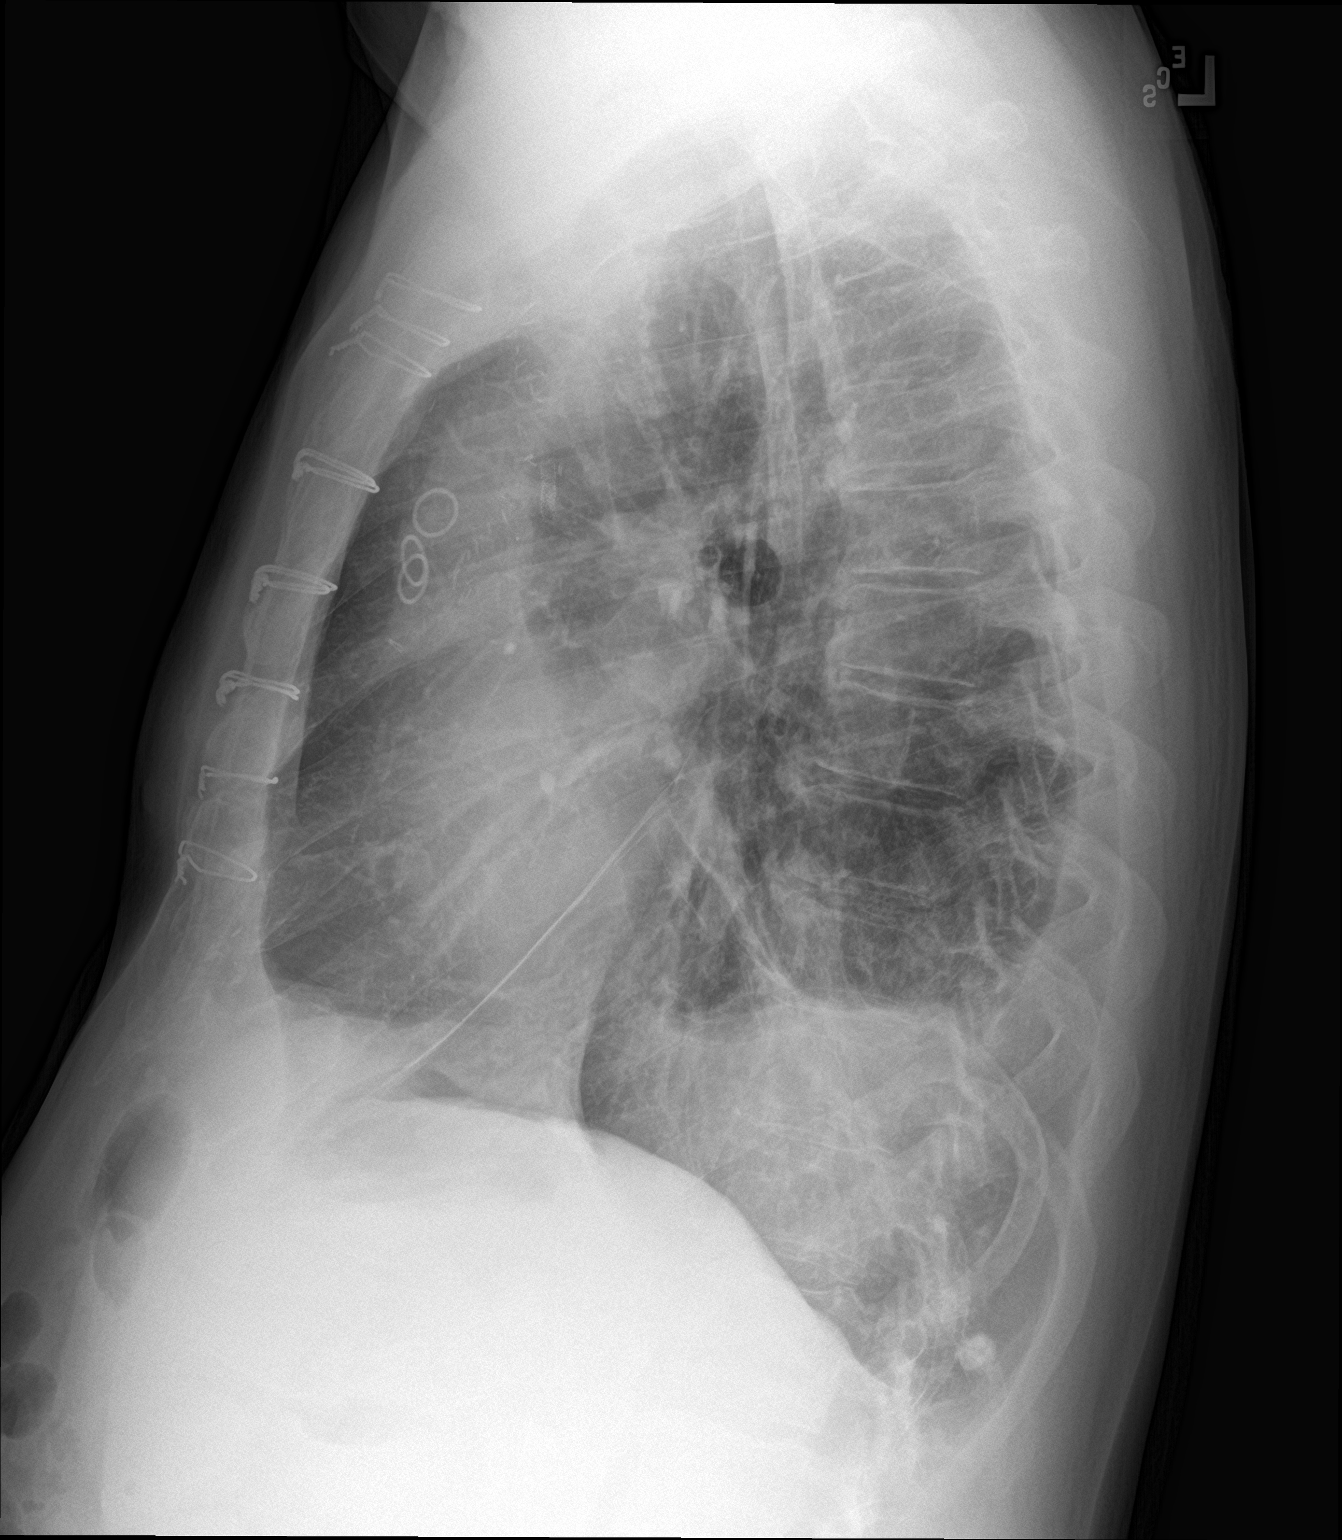

[2 of 2 positions shown; findings below may reference images not displayed]

FINDINGS: Postsurgical changes of the left lung with prior partial lobectomy.
Small left pleural effusion. Left lung volume loss. Right lung is
clear. No right pleural effusion or pneumothorax. Stable
cardiomediastinal silhouette. Prior CABG.

No acute osseous abnormality.
IMPRESSION: Postsurgical changes of the left lung with prior partial lobectomy.
Small left pleural effusion. Left lung volume loss.

No acute cardiopulmonary disease.

## 2021-01-10 MED ORDER — SODIUM CHLORIDE 0.9 % IV SOLN
Freq: Once | INTRAVENOUS | Status: DC
  Filled 2021-01-10: qty 250

## 2021-01-10 MED ORDER — HEPARIN SOD (PORK) LOCK FLUSH 100 UNIT/ML IV SOLN
500.0000 [IU] | Freq: Once | INTRAVENOUS | Status: DC | PRN
  Filled 2021-01-10: qty 5

## 2021-01-10 MED ORDER — SODIUM CHLORIDE 0.9 % IV SOLN: 150.0000 mg | Freq: Once | INTRAVENOUS | Status: DC

## 2021-01-10 MED ORDER — SODIUM CHLORIDE 0.9 % IV SOLN
200.0000 mg | Freq: Once | INTRAVENOUS | Status: AC
  Administered 2021-01-10: 200 mg via INTRAVENOUS
  Filled 2021-01-10: qty 8

## 2021-01-10 MED ORDER — SODIUM CHLORIDE 0.9 % IV SOLN
Freq: Once | INTRAVENOUS | Status: AC
  Filled 2021-01-10: qty 250

## 2021-01-10 MED ORDER — PALONOSETRON HCL INJECTION 0.25 MG/5ML
0.2500 mg | Freq: Once | INTRAVENOUS | Status: AC
Start: 2021-01-10 — End: 2021-01-10
  Administered 2021-01-10: 0.25 mg via INTRAVENOUS

## 2021-01-10 MED ORDER — PALONOSETRON HCL INJECTION 0.25 MG/5ML
INTRAVENOUS | Status: AC
  Filled 2021-01-10: qty 5

## 2021-01-10 MED ORDER — SODIUM CHLORIDE 0.9% FLUSH
10.0000 mL | INTRAVENOUS | Status: DC | PRN
  Filled 2021-01-10: qty 10

## 2021-01-10 MED ORDER — DEXAMETHASONE SODIUM PHOSPHATE 100 MG/10ML IJ SOLN
10.0000 mg | Freq: Once | INTRAMUSCULAR | Status: AC
  Administered 2021-01-10: 10 mg via INTRAVENOUS
  Filled 2021-01-10: qty 1

## 2021-01-10 NOTE — Progress Notes (Signed)
Oncology Nurse Navigator Documentation  Oncology Nurse Navigator Flowsheets 01/10/2021  Abnormal Finding Date -  Confirmed Diagnosis Date -  Diagnosis Status -  Planned Course of Treatment -  Phase of Treatment -  Chemotherapy Actual Start Date: -  Chemotherapy Actual End Date: -  Surgery Actual Start Date: -  Navigator Follow Up Date: 02/04/2021  Navigator Follow Up Reason: Follow-up Appointment;Chemotherapy  Navigator Location [facility]  Referral Date to RadOnc/MedOnc -  Navigator Encounter Type Treatment;Appt/Treatment Plan Review  Telephone -  Treatment Initiated Date -  Patient Visit Type MedOnc  Treatment Phase Active Tx  Barriers/Navigation Needs Coordination of Care;Education  Education -  Interventions Psycho-Social Support  Acuity Level 2-Minimal Needs (1-2 Barriers Identified)  Coordination of Care -  Education Method -  Support Groups/Services Friends and Family  Time Spent with Patient 15

## 2021-01-10 NOTE — Progress Notes (Signed)
Patient will not receive pemetrexed today. Cyanocobalamin and Emend removed from careplan per Dr. Antonieta Pert instructions. Patient will receive Aloxi and dexamethasone with Keytruda today.

## 2021-01-10 NOTE — Progress Notes (Signed)
Hematology and Oncology Follow Up Visit  John Hinton [id number] 1946/05/09 75 y.o. 01/10/2021   Principle Diagnosis:  Stage IVA (T3N2N0) adenocarcinoma of the LUL -- resected -- (+) KRAS  Current Therapy:   S/P cycle #6 of Carbo/Alimta/Pembrolizumab Alimta/pembrolizumab-maintenance therapy s/p cycle 2/4  - start on 11/28/2020 --Alimta discontinued after cycle #2.     Interim History:  John Hinton is back for follow-up.  Surprising, he really has had a hard time with the Alimta/pembrolizumab combination.  He is quite anemic.  He does not have a lot of energy.  He has been quite fatigued.  He has a cough.  I did do a chest x-ray on him today.  The chest x-ray did not show any obvious infiltrates.  Had a small left effusion.  He had left lung volume loss from postsurgical changes.  We will just plan to drop the Alimta.  I just think that this might be too much for him.  His appetite is not that great.  He has had no taste for food.  Hopefully this will improve.  He has had no fever.  There has been no bleeding.  He has had no diarrhea.  There is been no leg swelling.  He may have little bit of swelling around his ankles.  I think this is probably is from anemia.  Overall, I would have to say his performance status is probably ECOG 1.  Medications:  Current Outpatient Medications:    aspirin 81 MG tablet, Take 81 mg by mouth at bedtime. , Disp: , Rfl:    cholecalciferol (VITAMIN D) 1000 UNITS tablet, Take 1,000 Units by mouth at bedtime. , Disp: , Rfl:    clopidogrel (PLAVIX) 75 MG tablet, Take 1 tablet (75 mg total) by mouth daily., Disp: 90 tablet, Rfl: 3   Cyanocobalamin (B-12 PO), Take 1,000 mcg by mouth daily. , Disp: , Rfl:    dexamethasone (DECADRON) 4 MG tablet, Take 1 tab two times a day the day before Alimta chemo, then take 2 tabs once a day for 3 days starting the day after cisplatin., Disp: 30 tablet, Rfl: 1   doxazosin (CARDURA) 2 MG tablet, TAKE ONE TABLET BY MOUTH  DAILY, Disp: 90 tablet, Rfl: 0   ezetimibe (ZETIA) 10 MG tablet, Take 1 tablet (10 mg total) by mouth daily., Disp: 90 tablet, Rfl: 3   folic acid (FOLVITE) 1 MG tablet, Take 1 tablet (1 mg total) by mouth daily. Start 5-7 days before Alimta chemotherapy. Continue until 21 days after Alimta completed., Disp: 100 tablet, Rfl: 3   folic acid (FOLVITE) 193 MCG tablet, Take 400 mcg by mouth daily., Disp: , Rfl:    methylPREDNISolone (MEDROL DOSEPAK) 4 MG TBPK tablet, Take as directed., Disp: 1 each, Rfl: 0   Multiple Vitamin (MULTIVITAMIN) tablet, Take 1 tablet by mouth daily., Disp: , Rfl:    nitroGLYCERIN (NITROSTAT) 0.4 MG SL tablet, Place 1 tablet (0.4 mg total) under the tongue every 5 (five) minutes as needed for chest pain. (Patient not taking: No sig reported), Disp: 25 tablet, Rfl: 3   ondansetron (ZOFRAN) 8 MG tablet, Take 1 tablet (8 mg total) by mouth 2 (two) times daily as needed (Nausea or vomiting). Start if needed on the third day after cisplatin. (Patient not taking: Reported on 08/22/2020), Disp: 30 tablet, Rfl: 1   prochlorperazine (COMPAZINE) 10 MG tablet, Take 1 tablet (10 mg total) by mouth every 6 (six) hours as needed (Nausea or vomiting). (Patient not taking: Reported on  08/22/2020), Disp: 30 tablet, Rfl: 1   ramipril (ALTACE) 10 MG capsule, TAKE TWO CAPSULES BY MOUTH DAILY, Disp: 180 capsule, Rfl: 3   rosuvastatin (CRESTOR) 40 MG tablet, TAKE ONE TABLET BY MOUTH EVERY NIGHT AT BEDTIME, Disp: 90 tablet, Rfl: 0   sertraline (ZOLOFT) 50 MG tablet, Take 50 mg by mouth in the morning and at bedtime. , Disp: , Rfl:    spironolactone (ALDACTONE) 25 MG tablet, TAKE ONE TABLET BY MOUTH DAILY, Disp: 90 tablet, Rfl: 3  Allergies:  Allergies  Allergen Reactions   Amlodipine Swelling    Swelling of ankles    Past Medical History, Surgical history, Social history, and Family History were reviewed and updated.  Review of Systems: Review of Systems  Constitutional: Negative.   HENT:   Negative.    Eyes: Negative.   Respiratory: Negative.    Cardiovascular: Negative.   Gastrointestinal:  Positive for constipation.  Endocrine: Negative.   Genitourinary: Negative.    Musculoskeletal: Negative.   Skin: Negative.   Neurological: Negative.   Hematological: Negative.   Psychiatric/Behavioral: Negative.     Physical Exam:  weight is 196 lb (88.9 kg). His oral temperature is 98.6 F (37 C). His blood pressure is 129/65 and his pulse is 75. His respiration is 19 and oxygen saturation is 100%.   Wt Readings from Last 3 Encounters:  01/10/21 196 lb (88.9 kg)  12/19/20 201 lb (91.2 kg)  11/28/20 207 lb (93.9 kg)    Physical Exam Vitals reviewed.  HENT:     Head: Normocephalic and atraumatic.  Eyes:     Pupils: Pupils are equal, round, and reactive to light.  Cardiovascular:     Rate and Rhythm: Normal rate and regular rhythm.     Heart sounds: Normal heart sounds.  Pulmonary:     Effort: Pulmonary effort is normal.     Breath sounds: Normal breath sounds.  Abdominal:     General: Bowel sounds are normal.     Palpations: Abdomen is soft.  Musculoskeletal:        General: No tenderness or deformity. Normal range of motion.     Cervical back: Normal range of motion.  Lymphadenopathy:     Cervical: No cervical adenopathy.  Skin:    General: Skin is warm and dry.     Findings: No erythema or rash.  Neurological:     Mental Status: He is alert and oriented to person, place, and time.  Psychiatric:        Behavior: Behavior normal.        Thought Content: Thought content normal.        Judgment: Judgment normal.     Lab Results  Component Value Date   WBC 5.3 01/10/2021   HGB 8.8 (L) 01/10/2021   HCT 28.2 (L) 01/10/2021   MCV 100.0 01/10/2021   PLT 304 01/10/2021     Chemistry      Component Value Date/Time   NA 132 (L) 12/26/2020 1050   NA 141 02/05/2020 1425   K 3.7 12/26/2020 1050   CL 99 12/26/2020 1050   CO2 23 12/26/2020 1050   BUN 19  12/26/2020 1050   BUN 21 02/05/2020 1425   CREATININE 1.09 12/26/2020 1050   CREATININE 1.01 03/26/2016 1009      Component Value Date/Time   CALCIUM 9.1 12/26/2020 1050   ALKPHOS 70 12/26/2020 1050   AST 73 (H) 12/26/2020 1050   ALT 59 (H) 12/26/2020 1050   BILITOT 0.5 12/26/2020  1050      Impression and Plan: John Hinton is a very nice 75 year old white male.  He certainly looks a lot younger.  He has an atypical stage IV lung cancer which is disease has been resected.  Again, the left as can be dropped.  I just feel bad that he had a hard time with the combination of Alimta and pembrolizumab.  Hopefully, he will not have any problems with the pembrolizumab.  If he does well with this, then we will see about making the protocol every 6 weeks.  I would like to see him back in 3 weeks.  [name], MD 6/24/202212:01 PM

## 2021-01-11 LAB — ERYTHROPOIETIN: Erythropoietin: 100.4 m[IU]/mL — ABNORMAL HIGH (ref 2.6–18.5)

## 2021-01-11 LAB — T4: T4, Total: 5.5 ug/dL (ref 4.5–12.0)

## 2021-01-13 LAB — TSH: TSH: 1.987 u[IU]/mL (ref 0.320–4.118)

## 2021-01-30 ENCOUNTER — Ambulatory Visit: Payer: Medicare Other

## 2021-01-30 ENCOUNTER — Ambulatory Visit: Payer: Medicare Other | Admitting: Hematology & Oncology

## 2021-01-30 ENCOUNTER — Other Ambulatory Visit: Payer: Medicare Other

## 2021-01-31 ENCOUNTER — Ambulatory Visit: Payer: Medicare Other | Admitting: Cardiology

## 2021-02-04 ENCOUNTER — Inpatient Hospital Stay (HOSPITAL_BASED_OUTPATIENT_CLINIC_OR_DEPARTMENT_OTHER): Payer: Medicare Other | Admitting: Hematology & Oncology

## 2021-02-04 ENCOUNTER — Encounter: Payer: Self-pay | Admitting: Hematology & Oncology

## 2021-02-04 ENCOUNTER — Inpatient Hospital Stay: Payer: Medicare Other | Attending: Hematology & Oncology

## 2021-02-04 ENCOUNTER — Telehealth: Payer: Self-pay

## 2021-02-04 ENCOUNTER — Inpatient Hospital Stay: Payer: Medicare Other

## 2021-02-04 ENCOUNTER — Other Ambulatory Visit: Payer: Self-pay

## 2021-02-04 VITALS — BP 146/96 | HR 70 | Temp 97.6°F | Resp 18 | Wt 201.1 lb

## 2021-02-04 DIAGNOSIS — Z5111 Encounter for antineoplastic chemotherapy: Secondary | ICD-10-CM | POA: Insufficient documentation

## 2021-02-04 DIAGNOSIS — C3412 Malignant neoplasm of upper lobe, left bronchus or lung: Secondary | ICD-10-CM | POA: Diagnosis not present

## 2021-02-04 DIAGNOSIS — Z79899 Other long term (current) drug therapy: Secondary | ICD-10-CM | POA: Insufficient documentation

## 2021-02-04 DIAGNOSIS — E039 Hypothyroidism, unspecified: Secondary | ICD-10-CM | POA: Diagnosis not present

## 2021-02-04 DIAGNOSIS — R059 Cough, unspecified: Secondary | ICD-10-CM

## 2021-02-04 DIAGNOSIS — C3492 Malignant neoplasm of unspecified part of left bronchus or lung: Secondary | ICD-10-CM

## 2021-02-04 DIAGNOSIS — Z5112 Encounter for antineoplastic immunotherapy: Secondary | ICD-10-CM | POA: Insufficient documentation

## 2021-02-04 LAB — CMP (CANCER CENTER ONLY)
ALT: 18 U/L (ref 0–44)
AST: 24 U/L (ref 15–41)
Albumin: 4 g/dL (ref 3.5–5.0)
Alkaline Phosphatase: 75 U/L (ref 38–126)
Anion gap: 9 (ref 5–15)
BUN: 13 mg/dL (ref 8–23)
CO2: 27 mmol/L (ref 22–32)
Calcium: 9.6 mg/dL (ref 8.9–10.3)
Chloride: 107 mmol/L (ref 98–111)
Creatinine: 0.8 mg/dL (ref 0.61–1.24)
GFR, Estimated: >60 mL/min (ref >60)
Glucose, Bld: 96 mg/dL (ref 70–99)
Potassium: 4.2 mmol/L (ref 3.5–5.1)
Sodium: 143 mmol/L (ref 135–145)
Total Bilirubin: 0.3 mg/dL (ref 0.3–1.2)
Total Protein: 7 g/dL (ref 6.5–8.1)

## 2021-02-04 LAB — CBC WITH DIFFERENTIAL (CANCER CENTER ONLY)
Abs Immature Granulocytes: 0.02 10*3/uL (ref 0.00–0.07)
Basophils Absolute: 0 10*3/uL (ref 0.0–0.1)
Basophils Relative: 1 %
Eosinophils Absolute: 0.1 10*3/uL (ref 0.0–0.5)
Eosinophils Relative: 2 %
HCT: 34.5 % — ABNORMAL LOW (ref 39.0–52.0)
Hemoglobin: 10.9 g/dL — ABNORMAL LOW (ref 13.0–17.0)
Immature Granulocytes: 0 %
Lymphocytes Relative: 14 %
Lymphs Abs: 0.7 10*3/uL (ref 0.7–4.0)
MCH: 31.4 pg (ref 26.0–34.0)
MCHC: 31.6 g/dL (ref 30.0–36.0)
MCV: 99.4 fL (ref 80.0–100.0)
Monocytes Absolute: 0.5 10*3/uL (ref 0.1–1.0)
Monocytes Relative: 10 %
Neutro Abs: 3.8 10*3/uL (ref 1.7–7.7)
Neutrophils Relative %: 73 %
Platelet Count: 157 10*3/uL (ref 150–400)
RBC: 3.47 MIL/uL — ABNORMAL LOW (ref 4.22–5.81)
RDW: 16.2 % — ABNORMAL HIGH (ref 11.5–15.5)
WBC Count: 5.2 10*3/uL (ref 4.0–10.5)
nRBC: 0 % (ref 0.0–0.2)

## 2021-02-04 LAB — LACTATE DEHYDROGENASE: LDH: 182 U/L (ref 98–192)

## 2021-02-04 LAB — TSH: TSH: 3.403 u[IU]/mL (ref 0.320–4.118)

## 2021-02-04 MED ORDER — SODIUM CHLORIDE 0.9 % IV SOLN
Freq: Once | INTRAVENOUS | Status: DC
  Filled 2021-02-04: qty 250

## 2021-02-04 MED ORDER — SODIUM CHLORIDE 0.9 % IV SOLN
Freq: Once | INTRAVENOUS | Status: AC
  Filled 2021-02-04: qty 250

## 2021-02-04 MED ORDER — PALONOSETRON HCL INJECTION 0.25 MG/5ML
INTRAVENOUS | Status: AC
  Filled 2021-02-04: qty 5

## 2021-02-04 MED ORDER — SODIUM CHLORIDE 0.9 % IV SOLN
10.0000 mg | Freq: Once | INTRAVENOUS | Status: AC
  Administered 2021-02-04: 10 mg via INTRAVENOUS
  Filled 2021-02-04: qty 10

## 2021-02-04 MED ORDER — SODIUM CHLORIDE 0.9 % IV SOLN
400.0000 mg | Freq: Once | INTRAVENOUS | Status: AC
  Administered 2021-02-04: 400 mg via INTRAVENOUS
  Filled 2021-02-04: qty 16

## 2021-02-04 MED ORDER — PALONOSETRON HCL INJECTION 0.25 MG/5ML
0.2500 mg | Freq: Once | INTRAVENOUS | Status: AC
  Administered 2021-02-04: 0.25 mg via INTRAVENOUS

## 2021-02-04 NOTE — Telephone Encounter (Signed)
Appts made per 02/04/21 los, pt to gain sch at [facility] and as well through [employer]

## 2021-02-04 NOTE — Progress Notes (Signed)
Hematology and Oncology Follow Up Visit  John Hinton [id number] 07-29-1945 75 y.o. 02/04/2021   Principle Diagnosis:  Stage IVA (T3N2N0) adenocarcinoma of the LUL -- resected -- (+) KRAS  Current Therapy:   S/P cycle #6 of Carbo/Alimta/Pembrolizumab Alimta/pembrolizumab-maintenance therapy s/p cycle 2/4  - start on 11/28/2020 --Alimta discontinued after cycle #2.     Interim History:  John Hinton is back for follow-up.  He did a whole lot better when we drop the Alimta.  He had no problems with the pembrolizumab.  I think we can now move his appointments out to every 6 weeks.  He says he does have some breast tissue that is little bit tender.  This is bilateral.  He may have little bit of gynecomastia.  He has had no problems with cough.  There is no shortness of breath.  His appetite has been good.  He has had no nausea or vomiting.  There is been no diarrhea.  He has had no rashes.  There is been no leg swelling.  So far, he has had no issues with hypothyroidism.  His last TSH was 2.  Currently, his performance status is ECOG 0.   Medications:  Current Outpatient Medications:    aspirin 81 MG tablet, Take 81 mg by mouth at bedtime. , Disp: , Rfl:    cholecalciferol (VITAMIN D) 1000 UNITS tablet, Take 1,000 Units by mouth at bedtime. , Disp: , Rfl:    clopidogrel (PLAVIX) 75 MG tablet, Take 1 tablet (75 mg total) by mouth daily., Disp: 90 tablet, Rfl: 3   Cyanocobalamin (B-12 PO), Take 1,000 mcg by mouth daily. , Disp: , Rfl:    doxazosin (CARDURA) 2 MG tablet, TAKE ONE TABLET BY MOUTH DAILY, Disp: 90 tablet, Rfl: 0   ezetimibe (ZETIA) 10 MG tablet, Take 1 tablet (10 mg total) by mouth daily., Disp: 90 tablet, Rfl: 3   folic acid (FOLVITE) 929 MCG tablet, Take 400 mcg by mouth daily., Disp: , Rfl:    Multiple Vitamin (MULTIVITAMIN) tablet, Take 1 tablet by mouth daily., Disp: , Rfl:    nitroGLYCERIN (NITROSTAT) 0.4 MG SL tablet, Place 1 tablet (0.4 mg total) under the tongue  every 5 (five) minutes as needed for chest pain., Disp: 25 tablet, Rfl: 3   ondansetron (ZOFRAN) 8 MG tablet, Take 1 tablet (8 mg total) by mouth 2 (two) times daily as needed (Nausea or vomiting). Start if needed on the third day after cisplatin., Disp: 30 tablet, Rfl: 1   prochlorperazine (COMPAZINE) 10 MG tablet, Take 1 tablet (10 mg total) by mouth every 6 (six) hours as needed (Nausea or vomiting)., Disp: 30 tablet, Rfl: 1   ramipril (ALTACE) 10 MG capsule, TAKE TWO CAPSULES BY MOUTH DAILY, Disp: 180 capsule, Rfl: 3   rosuvastatin (CRESTOR) 40 MG tablet, TAKE ONE TABLET BY MOUTH EVERY NIGHT AT BEDTIME, Disp: 90 tablet, Rfl: 0   sertraline (ZOLOFT) 50 MG tablet, Take 50 mg by mouth in the morning and at bedtime. , Disp: , Rfl:    spironolactone (ALDACTONE) 25 MG tablet, TAKE ONE TABLET BY MOUTH DAILY, Disp: 90 tablet, Rfl: 3   dexamethasone (DECADRON) 4 MG tablet, Take 1 tab two times a day the day before Alimta chemo, then take 2 tabs once a day for 3 days starting the day after cisplatin. (Patient not taking: Reported on 02/04/2021), Disp: 30 tablet, Rfl: 1   folic acid (FOLVITE) 1 MG tablet, Take 1 tablet (1 mg total) by mouth daily. Start 5-7  days before Alimta chemotherapy. Continue until 21 days after Alimta completed. (Patient not taking: Reported on 02/04/2021), Disp: 100 tablet, Rfl: 3   methylPREDNISolone (MEDROL DOSEPAK) 4 MG TBPK tablet, Take as directed. (Patient not taking: Reported on 02/04/2021), Disp: 1 each, Rfl: 0  Allergies:  Allergies  Allergen Reactions   Amlodipine Swelling    Swelling of ankles    Past Medical History, Surgical history, Social history, and Family History were reviewed and updated.  Review of Systems: Review of Systems  Constitutional: Negative.   HENT:  Negative.    Eyes: Negative.   Respiratory: Negative.    Cardiovascular: Negative.   Gastrointestinal:  Positive for constipation.  Endocrine: Negative.   Genitourinary: Negative.     Musculoskeletal: Negative.   Skin: Negative.   Neurological: Negative.   Hematological: Negative.   Psychiatric/Behavioral: Negative.     Physical Exam:  weight is 201 lb 1.9 oz (91.2 kg). His oral temperature is 97.6 F (36.4 C). His blood pressure is 146/96 (abnormal) and his pulse is 70. His respiration is 18 and oxygen saturation is 98%.   Wt Readings from Last 3 Encounters:  02/04/21 201 lb 1.9 oz (91.2 kg)  01/10/21 196 lb (88.9 kg)  12/19/20 201 lb (91.2 kg)    Physical Exam Vitals reviewed.  HENT:     Head: Normocephalic and atraumatic.  Eyes:     Pupils: Pupils are equal, round, and reactive to light.  Cardiovascular:     Rate and Rhythm: Normal rate and regular rhythm.     Heart sounds: Normal heart sounds.  Pulmonary:     Effort: Pulmonary effort is normal.     Breath sounds: Normal breath sounds.  Abdominal:     General: Bowel sounds are normal.     Palpations: Abdomen is soft.  Musculoskeletal:        General: No tenderness or deformity. Normal range of motion.     Cervical back: Normal range of motion.  Lymphadenopathy:     Cervical: No cervical adenopathy.  Skin:    General: Skin is warm and dry.     Findings: No erythema or rash.  Neurological:     Mental Status: He is alert and oriented to person, place, and time.  Psychiatric:        Behavior: Behavior normal.        Thought Content: Thought content normal.        Judgment: Judgment normal.     Lab Results  Component Value Date   WBC 5.2 02/04/2021   HGB 10.9 (L) 02/04/2021   HCT 34.5 (L) 02/04/2021   MCV 99.4 02/04/2021   PLT 157 02/04/2021     Chemistry      Component Value Date/Time   NA 143 02/04/2021 0836   NA 141 02/05/2020 1425   K 4.2 02/04/2021 0836   CL 107 02/04/2021 0836   CO2 27 02/04/2021 0836   BUN 13 02/04/2021 0836   BUN 21 02/05/2020 1425   CREATININE 0.80 02/04/2021 0836   CREATININE 1.01 03/26/2016 1009      Component Value Date/Time   CALCIUM 9.6 02/04/2021  0836   ALKPHOS 75 02/04/2021 0836   AST 24 02/04/2021 0836   ALT 18 02/04/2021 0836   BILITOT 0.3 02/04/2021 0836      Impression and Plan: John Hinton is a very nice 75 year old white male.  He certainly looks a lot younger.  He has an atypical stage IV lung cancer which is disease has been  resected.  We will go with pembrolizumab by itself now.  I think this would be reasonable.  We will try to move his appointments to every 6 weeks.  This will be a whole lot easier for him.  We will go ahead and plan to get him back to see [place] in 6 weeks.  Provide on think we have to do any scans until after Labor Day.   [name], MD 7/19/202210:04 AM

## 2021-02-05 LAB — T4: T4, Total: 5.4 ug/dL (ref 4.5–12.0)

## 2021-02-06 DIAGNOSIS — Z23 Encounter for immunization: Secondary | ICD-10-CM | POA: Diagnosis not present

## 2021-02-11 ENCOUNTER — Encounter: Payer: Self-pay | Admitting: *Deleted

## 2021-02-11 NOTE — Progress Notes (Signed)
Oncology Nurse Navigator Documentation  Oncology Nurse Navigator Flowsheets 02/11/2021  Abnormal Finding Date -  Confirmed Diagnosis Date -  Diagnosis Status -  Planned Course of Treatment -  Phase of Treatment -  Chemotherapy Actual Start Date: -  Chemotherapy Actual End Date: -  Surgery Actual Start Date: -  Navigator Follow Up Date: 03/18/2021  Navigator Follow Up Reason: Follow-up Appointment;Chemotherapy  Navigator Location [facility]  Referral Date to RadOnc/MedOnc -  Navigator Encounter Type Appt/Treatment Plan Review  Telephone -  Treatment Initiated Date -  Patient Visit Type MedOnc  Treatment Phase Active Tx  Barriers/Navigation Needs No Barriers At This Time  Education -  Interventions -  Acuity Level 1-No Barriers  Coordination of Care -  Education Method -  Support Groups/Services Friends and Family  Time Spent with Patient 15

## 2021-02-17 ENCOUNTER — Telehealth: Payer: Self-pay

## 2021-02-27 ENCOUNTER — Other Ambulatory Visit: Payer: Self-pay

## 2021-02-27 ENCOUNTER — Ambulatory Visit (HOSPITAL_COMMUNITY): Payer: Medicare Other | Attending: Cardiology

## 2021-02-27 DIAGNOSIS — I420 Dilated cardiomyopathy: Secondary | ICD-10-CM | POA: Insufficient documentation

## 2021-02-27 DIAGNOSIS — I77810 Thoracic aortic ectasia: Secondary | ICD-10-CM | POA: Insufficient documentation

## 2021-02-27 LAB — ECHOCARDIOGRAM COMPLETE
Area-P 1/2: 3.97 cm2
S' Lateral: 4.6 cm

## 2021-03-04 ENCOUNTER — Telehealth: Payer: Self-pay | Admitting: Cardiology

## 2021-03-04 DIAGNOSIS — I420 Dilated cardiomyopathy: Secondary | ICD-10-CM

## 2021-03-04 NOTE — Telephone Encounter (Signed)
Sueanne Margarita, MD  03/01/2021  5:32 PM EDT     Echo showed mildly decreased LVF with EF 40-45%, mildly enlarged LV, mild pulmonary HTN, moderately enlarged LA, mildly leaky MV and mildly dilated aortic root at 53mm.  Compared to prior echo 02/2020>>LVF has declined, the ascending aorta is is mildly dilated and the PAP is borderline.  Please get a cardiac MRI with gad to assess LVF further  The patient has been notified of the result and verbalized understanding.  All questions (if any) were answered. Antonieta Iba, RN 03/04/2021 3:49 PM  Cardiac MRI has been ordered.

## 2021-03-04 NOTE — Telephone Encounter (Signed)
Patient is returning call to discuss echo results. °

## 2021-03-14 DIAGNOSIS — H16223 Keratoconjunctivitis sicca, not specified as Sjogren's, bilateral: Secondary | ICD-10-CM | POA: Diagnosis not present

## 2021-03-18 ENCOUNTER — Ambulatory Visit: Payer: Medicare Other | Admitting: Hematology & Oncology

## 2021-03-18 ENCOUNTER — Other Ambulatory Visit: Payer: Self-pay | Admitting: Cardiology

## 2021-03-18 ENCOUNTER — Ambulatory Visit: Payer: Medicare Other

## 2021-03-18 ENCOUNTER — Other Ambulatory Visit: Payer: Medicare Other

## 2021-03-19 ENCOUNTER — Encounter: Payer: Self-pay | Admitting: Hematology & Oncology

## 2021-03-19 ENCOUNTER — Encounter: Payer: Self-pay | Admitting: *Deleted

## 2021-03-19 ENCOUNTER — Inpatient Hospital Stay: Payer: Medicare Other | Attending: Hematology & Oncology

## 2021-03-19 ENCOUNTER — Inpatient Hospital Stay: Payer: Medicare Other

## 2021-03-19 ENCOUNTER — Inpatient Hospital Stay (HOSPITAL_BASED_OUTPATIENT_CLINIC_OR_DEPARTMENT_OTHER): Payer: Medicare Other | Admitting: Hematology & Oncology

## 2021-03-19 ENCOUNTER — Other Ambulatory Visit: Payer: Self-pay

## 2021-03-19 ENCOUNTER — Telehealth: Payer: Self-pay | Admitting: *Deleted

## 2021-03-19 VITALS — BP 144/79 | HR 78 | Temp 98.8°F | Resp 17 | Ht 71.0 in | Wt 207.8 lb

## 2021-03-19 DIAGNOSIS — E039 Hypothyroidism, unspecified: Secondary | ICD-10-CM

## 2021-03-19 DIAGNOSIS — C3492 Malignant neoplasm of unspecified part of left bronchus or lung: Secondary | ICD-10-CM

## 2021-03-19 DIAGNOSIS — Z5112 Encounter for antineoplastic immunotherapy: Secondary | ICD-10-CM | POA: Diagnosis not present

## 2021-03-19 DIAGNOSIS — Z79899 Other long term (current) drug therapy: Secondary | ICD-10-CM | POA: Diagnosis not present

## 2021-03-19 DIAGNOSIS — C3412 Malignant neoplasm of upper lobe, left bronchus or lung: Secondary | ICD-10-CM | POA: Diagnosis not present

## 2021-03-19 LAB — CBC WITH DIFFERENTIAL (CANCER CENTER ONLY)
Abs Immature Granulocytes: 0.03 10*3/uL (ref 0.00–0.07)
Basophils Absolute: 0 10*3/uL (ref 0.0–0.1)
Basophils Relative: 0 %
Eosinophils Absolute: 0.1 10*3/uL (ref 0.0–0.5)
Eosinophils Relative: 2 %
HCT: 36.7 % — ABNORMAL LOW (ref 39.0–52.0)
Hemoglobin: 11.6 g/dL — ABNORMAL LOW (ref 13.0–17.0)
Immature Granulocytes: 0 %
Lymphocytes Relative: 13 %
Lymphs Abs: 0.9 10*3/uL (ref 0.7–4.0)
MCH: 30.3 pg (ref 26.0–34.0)
MCHC: 31.6 g/dL (ref 30.0–36.0)
MCV: 95.8 fL (ref 80.0–100.0)
Monocytes Absolute: 0.7 10*3/uL (ref 0.1–1.0)
Monocytes Relative: 10 %
Neutro Abs: 5 10*3/uL (ref 1.7–7.7)
Neutrophils Relative %: 75 %
Platelet Count: 181 10*3/uL (ref 150–400)
RBC: 3.83 MIL/uL — ABNORMAL LOW (ref 4.22–5.81)
RDW: 13.8 % (ref 11.5–15.5)
WBC Count: 6.7 10*3/uL (ref 4.0–10.5)
nRBC: 0 % (ref 0.0–0.2)

## 2021-03-19 LAB — CMP (CANCER CENTER ONLY)
ALT: 18 U/L (ref 0–44)
AST: 23 U/L (ref 15–41)
Albumin: 4.1 g/dL (ref 3.5–5.0)
Alkaline Phosphatase: 79 U/L (ref 38–126)
Anion gap: 9 (ref 5–15)
BUN: 15 mg/dL (ref 8–23)
CO2: 28 mmol/L (ref 22–32)
Calcium: 9.5 mg/dL (ref 8.9–10.3)
Chloride: 106 mmol/L (ref 98–111)
Creatinine: 0.88 mg/dL (ref 0.61–1.24)
GFR, Estimated: >60 mL/min (ref >60)
Glucose, Bld: 99 mg/dL (ref 70–99)
Potassium: 4.1 mmol/L (ref 3.5–5.1)
Sodium: 143 mmol/L (ref 135–145)
Total Bilirubin: 0.3 mg/dL (ref 0.3–1.2)
Total Protein: 7.3 g/dL (ref 6.5–8.1)

## 2021-03-19 LAB — TSH: TSH: 2.356 u[IU]/mL (ref 0.320–4.118)

## 2021-03-19 LAB — LACTATE DEHYDROGENASE: LDH: 201 U/L — ABNORMAL HIGH (ref 98–192)

## 2021-03-19 MED ORDER — SODIUM CHLORIDE 0.9 % IV SOLN
10.0000 mg | Freq: Once | INTRAVENOUS | Status: AC
  Administered 2021-03-19: 10 mg via INTRAVENOUS
  Filled 2021-03-19: qty 10

## 2021-03-19 MED ORDER — SODIUM CHLORIDE 0.9 % IV SOLN: Freq: Once | INTRAVENOUS | Status: AC

## 2021-03-19 MED ORDER — SODIUM CHLORIDE 0.9% FLUSH: 10.0000 mL | INTRAVENOUS | Status: DC | PRN

## 2021-03-19 MED ORDER — SODIUM CHLORIDE 0.9 % IV SOLN: Freq: Once | INTRAVENOUS | Status: DC

## 2021-03-19 MED ORDER — SODIUM CHLORIDE 0.9 % IV SOLN
400.0000 mg | Freq: Once | INTRAVENOUS | Status: AC
  Administered 2021-03-19: 400 mg via INTRAVENOUS
  Filled 2021-03-19: qty 16

## 2021-03-19 MED ORDER — HEPARIN SOD (PORK) LOCK FLUSH 100 UNIT/ML IV SOLN: 500.0000 [IU] | Freq: Once | INTRAVENOUS | Status: DC | PRN

## 2021-03-19 MED ORDER — PALONOSETRON HCL INJECTION 0.25 MG/5ML
0.2500 mg | Freq: Once | INTRAVENOUS | Status: AC
  Administered 2021-03-19: 0.25 mg via INTRAVENOUS
  Filled 2021-03-19: qty 5

## 2021-03-19 NOTE — Progress Notes (Signed)
Visited with patient in treatment room prior to MD visit. He is doing well and enjoys that his treatments are now every 6 weeks.   Patient will need a PET scan scheduled prior to his next treatment. PET scheduled for 04/23/2021. Reviewed appointment date, time and location with patient. Radiology info sheet given to patient with appointment details and prep instructions.   Oncology Nurse Navigator Documentation  Oncology Nurse Navigator Flowsheets 03/19/2021  Abnormal Finding Date -  Confirmed Diagnosis Date -  Diagnosis Status -  Planned Course of Treatment -  Phase of Treatment -  Chemotherapy Actual Start Date: -  Chemotherapy Actual End Date: -  Surgery Actual Start Date: -  Navigator Follow Up Date: 04/23/2021  Navigator Follow Up Reason: Scan Review  Navigator Location [facility]  Referral Date to RadOnc/MedOnc -  Navigator Encounter Type Appt/Treatment Plan Review  Telephone -  Treatment Initiated Date -  Patient Visit Type MedOnc  Treatment Phase Active Tx  Barriers/Navigation Needs Coordination of Care;Education  Education Other  Interventions Coordination of Care;Education;Psycho-Social Support  Acuity Level 2-Minimal Needs (1-2 Barriers Identified)  Coordination of Care Radiology  Education Method Written;Verbal  Support Groups/Services Friends and Family  Time Spent with Patient 30

## 2021-03-19 NOTE — Telephone Encounter (Signed)
Per 03/19/21 los - gave upcoming appointments - confirmed - print calendar

## 2021-03-19 NOTE — Progress Notes (Signed)
Hematology and Oncology Follow Up Visit  John Hinton [id number] 1945/09/06 75 y.o. 03/19/2021   Principle Diagnosis:  Stage IVA (T3N2N0) adenocarcinoma of the LUL -- resected -- (+) KRAS  Current Therapy:   S/P cycle #6 of Carbo/Alimta/Pembrolizumab Alimta/pembrolizumab-maintenance therapy s/p cycle 3/4  - start on 11/28/2020 --Alimta discontinued after cycle #2.     Interim History:  John Hinton is back for follow-up.  He looks great.  He feels good.  He really has had no problems with the pembrolizumab.  He has had no issues with nausea or vomiting.  He has had no cough or shortness of breath.  He does have a little bit of discomfort where he had his thoracotomy done.  He has had no change in bowel or bladder habits.  There is been no issues with diarrhea.  He has had no leg swelling.  He has had no rashes.  He has had no headache.  There is been no problems with fever.  He has had no bleeding.  Overall, his performance status is ECOG 0.    Medications:  Current Outpatient Medications:    aspirin 81 MG tablet, Take 81 mg by mouth at bedtime. , Disp: , Rfl:    cholecalciferol (VITAMIN D) 1000 UNITS tablet, Take 1,000 Units by mouth at bedtime. , Disp: , Rfl:    clopidogrel (PLAVIX) 75 MG tablet, Take 1 tablet (75 mg total) by mouth daily., Disp: 90 tablet, Rfl: 3   Cyanocobalamin (B-12 PO), Take 1,000 mcg by mouth daily. , Disp: , Rfl:    doxazosin (CARDURA) 2 MG tablet, TAKE ONE TABLET BY MOUTH DAILY, Disp: 90 tablet, Rfl: 0   ezetimibe (ZETIA) 10 MG tablet, Take 1 tablet (10 mg total) by mouth daily., Disp: 90 tablet, Rfl: 3   folic acid (FOLVITE) 275 MCG tablet, Take 400 mcg by mouth daily., Disp: , Rfl:    Multiple Vitamin (MULTIVITAMIN) tablet, Take 1 tablet by mouth daily., Disp: , Rfl:    ondansetron (ZOFRAN) 8 MG tablet, Take 1 tablet (8 mg total) by mouth 2 (two) times daily as needed (Nausea or vomiting). Start if needed on the third day after cisplatin., Disp: 30  tablet, Rfl: 1   prochlorperazine (COMPAZINE) 10 MG tablet, Take 1 tablet (10 mg total) by mouth every 6 (six) hours as needed (Nausea or vomiting)., Disp: 30 tablet, Rfl: 1   ramipril (ALTACE) 10 MG capsule, TAKE TWO CAPSULES BY MOUTH DAILY, Disp: 180 capsule, Rfl: 3   rosuvastatin (CRESTOR) 40 MG tablet, TAKE ONE TABLET BY MOUTH EVERY NIGHT AT BEDTIME, Disp: 90 tablet, Rfl: 0   sertraline (ZOLOFT) 50 MG tablet, Take 50 mg by mouth in the morning and at bedtime. , Disp: , Rfl:    spironolactone (ALDACTONE) 25 MG tablet, TAKE ONE TABLET BY MOUTH DAILY, Disp: 90 tablet, Rfl: 3   dexamethasone (DECADRON) 4 MG tablet, Take 1 tab two times a day the day before Alimta chemo, then take 2 tabs once a day for 3 days starting the day after cisplatin. (Patient not taking: No sig reported), Disp: 30 tablet, Rfl: 1   folic acid (FOLVITE) 1 MG tablet, Take 1 tablet (1 mg total) by mouth daily. Start 5-7 days before Alimta chemotherapy. Continue until 21 days after Alimta completed. (Patient not taking: No sig reported), Disp: 100 tablet, Rfl: 3   nitroGLYCERIN (NITROSTAT) 0.4 MG SL tablet, Place 1 tablet (0.4 mg total) under the tongue every 5 (five) minutes as needed for chest pain. (Patient  not taking: Reported on 03/19/2021), Disp: 25 tablet, Rfl: 3 No current facility-administered medications for this visit.  Facility-Administered Medications Ordered in Other Visits:    0.9 %  sodium chloride infusion, , Intravenous, Once, Flo Berroa, Rudell Cobb, MD   dexamethasone (DECADRON) 10 mg in sodium chloride 0.9 % 50 mL IVPB, 10 mg, Intravenous, Once, Amos Micheals, Rudell Cobb, MD, Last Rate: 204 mL/hr at 03/19/21 1002, 10 mg at 03/19/21 1002   heparin lock flush 100 unit/mL, 500 Units, Intracatheter, Once PRN, Lamont Tant, Rudell Cobb, MD   pembrolizumab [facility]) 400 mg in sodium chloride 0.9 % 50 mL chemo infusion, 400 mg, Intravenous, Once, Bette Brienza, Rudell Cobb, MD   sodium chloride flush (NS) 0.9 % injection 10 mL, 10 mL, Intracatheter,  PRN, [name], MD  Allergies:  Allergies  Allergen Reactions   Amlodipine Swelling    Swelling of ankles    Past Medical History, Surgical history, Social history, and Family History were reviewed and updated.  Review of Systems: Review of Systems  Constitutional: Negative.   HENT:  Negative.    Eyes: Negative.   Respiratory: Negative.    Cardiovascular: Negative.   Gastrointestinal:  Positive for constipation.  Endocrine: Negative.   Genitourinary: Negative.    Musculoskeletal: Negative.   Skin: Negative.   Neurological: Negative.   Hematological: Negative.   Psychiatric/Behavioral: Negative.     Physical Exam:  height is '5\' 11"'  (1.803 m) and weight is 207 lb 12.8 oz (94.3 kg). His oral temperature is 98.8 F (37.1 C). His blood pressure is 144/79 (abnormal) and his pulse is 78. His respiration is 17 and oxygen saturation is 99%.   Wt Readings from Last 3 Encounters:  03/19/21 207 lb 12.8 oz (94.3 kg)  02/04/21 201 lb 1.9 oz (91.2 kg)  01/10/21 196 lb (88.9 kg)    Physical Exam Vitals reviewed.  HENT:     Head: Normocephalic and atraumatic.  Eyes:     Pupils: Pupils are equal, round, and reactive to light.  Cardiovascular:     Rate and Rhythm: Normal rate and regular rhythm.     Heart sounds: Normal heart sounds.  Pulmonary:     Effort: Pulmonary effort is normal.     Breath sounds: Normal breath sounds.  Abdominal:     General: Bowel sounds are normal.     Palpations: Abdomen is soft.  Musculoskeletal:        General: No tenderness or deformity. Normal range of motion.     Cervical back: Normal range of motion.  Lymphadenopathy:     Cervical: No cervical adenopathy.  Skin:    General: Skin is warm and dry.     Findings: No erythema or rash.  Neurological:     Mental Status: He is alert and oriented to person, place, and time.  Psychiatric:        Behavior: Behavior normal.        Thought Content: Thought content normal.        Judgment:  Judgment normal.     Lab Results  Component Value Date   WBC 6.7 03/19/2021   HGB 11.6 (L) 03/19/2021   HCT 36.7 (L) 03/19/2021   MCV 95.8 03/19/2021   PLT 181 03/19/2021     Chemistry      Component Value Date/Time   NA 143 03/19/2021 0814   NA 141 02/05/2020 1425   K 4.1 03/19/2021 0814   CL 106 03/19/2021 0814   CO2 28 03/19/2021 0814   BUN 15  03/19/2021 0814   BUN 21 02/05/2020 1425   CREATININE 0.88 03/19/2021 0814   CREATININE 1.01 03/26/2016 1009      Component Value Date/Time   CALCIUM 9.5 03/19/2021 0814   ALKPHOS 79 03/19/2021 0814   AST 23 03/19/2021 0814   ALT 18 03/19/2021 0814   BILITOT 0.3 03/19/2021 0814      Impression and Plan: Mr. Plagge is a very nice 75 year old white male.  He certainly looks a lot younger.  He has an atypical stage IV lung cancer which is disease has been resected.  He is on maintenance therapy with pembrolizumab.  This will be his third cycle of pembrolizumab.  I will go ahead and plan to scan him prior to his fourth cycle of pembrolizumab.  We will plan to get him back in 6 weeks.   [name], MD 8/31/202210:15 AM

## 2021-03-20 LAB — T4: T4, Total: 4.9 ug/dL (ref 4.5–12.0)

## 2021-03-27 DIAGNOSIS — Z23 Encounter for immunization: Secondary | ICD-10-CM | POA: Diagnosis not present

## 2021-04-08 ENCOUNTER — Encounter: Payer: Self-pay | Admitting: Cardiology

## 2021-04-08 ENCOUNTER — Other Ambulatory Visit: Payer: Self-pay

## 2021-04-08 ENCOUNTER — Ambulatory Visit (INDEPENDENT_AMBULATORY_CARE_PROVIDER_SITE_OTHER): Payer: Medicare Other | Admitting: Cardiology

## 2021-04-08 VITALS — BP 112/74 | HR 64 | Ht 71.0 in | Wt 201.6 lb

## 2021-04-08 DIAGNOSIS — E7800 Pure hypercholesterolemia, unspecified: Secondary | ICD-10-CM

## 2021-04-08 DIAGNOSIS — I2510 Atherosclerotic heart disease of native coronary artery without angina pectoris: Secondary | ICD-10-CM

## 2021-04-08 DIAGNOSIS — G4733 Obstructive sleep apnea (adult) (pediatric): Secondary | ICD-10-CM

## 2021-04-08 DIAGNOSIS — I10 Essential (primary) hypertension: Secondary | ICD-10-CM | POA: Diagnosis not present

## 2021-04-08 LAB — LIPID PANEL
Chol/HDL Ratio: 3.3 ratio (ref 0.0–5.0)
Cholesterol, Total: 145 mg/dL (ref 100–199)
HDL: 44 mg/dL (ref >39)
LDL Chol Calc (NIH): 81 mg/dL (ref 0–99)
Triglycerides: 112 mg/dL (ref 0–149)
VLDL Cholesterol Cal: 20 mg/dL (ref 5–40)

## 2021-04-08 LAB — COMPREHENSIVE METABOLIC PANEL
ALT: 19 IU/L (ref 0–44)
AST: 25 IU/L (ref 0–40)
Albumin/Globulin Ratio: 1.6 (ref 1.2–2.2)
Albumin: 4.6 g/dL (ref 3.7–4.7)
Alkaline Phosphatase: 97 IU/L (ref 44–121)
BUN/Creatinine Ratio: 19 (ref 10–24)
BUN: 15 mg/dL (ref 8–27)
Bilirubin Total: 0.3 mg/dL (ref 0.0–1.2)
CO2: 24 mmol/L (ref 20–29)
Calcium: 9.7 mg/dL (ref 8.6–10.2)
Chloride: 99 mmol/L (ref 96–106)
Creatinine, Ser: 0.8 mg/dL (ref 0.76–1.27)
Globulin, Total: 2.8 g/dL (ref 1.5–4.5)
Glucose: 99 mg/dL (ref 65–99)
Potassium: 4.5 mmol/L (ref 3.5–5.2)
Sodium: 139 mmol/L (ref 134–144)
Total Protein: 7.4 g/dL (ref 6.0–8.5)
eGFR: 93 mL/min/{1.73_m2} (ref >59)

## 2021-04-08 NOTE — Addendum Note (Signed)
Addended by: Antonieta Iba on: 04/08/2021 10:29 AM   Modules accepted: Orders

## 2021-04-08 NOTE — Progress Notes (Signed)
Office Visit  Note   Date:  04/08/2021   ID:  John Hinton, DOB 22-Nov-1945, MRN [id number]   PCP:  [name], MD  Cardiologist:  Fransico Him, MD Electrophysiologist:  None   Chief Complaint:  CAD, HTN, lipids and OSA  History of Present Illness:    John Hinton is a 75 y.o. male with a hx of ASCAD s/p CABG and then PCI of SVG to OM1.  Repeat heart cath for unstable angina on 01/27/2017 showed severe focal in-stent restenosis in the SVG to OM stent status post balloon angioplasty.  All of the grafts were patent.  He has a history of hyperlipidemia and hypertension as well as moderate OSA with a PSG showing an AHI of 16.5/hr and is on CPAP at 8cm H2O.  Unfortunately he was dx with stage 4a adenoCA of the LUL lung s/p resection now s/p chemo with Carbo/Alimta/Pembrolizumab and on maintenance with Pembrolizumab. He is now in remission and his SOB has significantly improved.   He is here today for followup and is doing well.  He denies any chest pain or pressure, SOB, DOE (except when he overexerts himself), PND, orthopnea, LE edema, dizziness, palpitations or syncope. He is compliant with his meds and is tolerating meds with no SE.     He is doing well with his CPAP device and thinks that he has gotten used to it.  He tolerates the mask and feels the pressure is adequate.  Since going on CPAP he feels rested in the am and has no significant daytime sleepiness.  He denies any significant mouth or nasal dryness or nasal congestion.  He does not think that he snores.     Prior CV studies:   The following studies were reviewed today:  PAP compliance download  Past Medical History:  Diagnosis Date   Arrhythmia    Post-op   BPH (benign prostatic hypertrophy)    Bradycardia    a. H/o asymptomatic bradycardia.   Coronary artery disease    a. s/p CABG ~2007 with LIMA to LAD, SVG seq to OM1 and OM2, SVG to PDA and SVG to diag. b. Abnormal nuc 03/2015 - s/p DES to SVG-OM1-OM2.  01/29/17 POBA with cutting balloon to SVG-->OM   DCM (dilated cardiomyopathy) ([place]) 09/17/2015   Dyslipidemia    Dyspnea    r/t lung mass   ED (erectile dysfunction)    Fear of heights    Goals of care, counseling/discussion 06/19/2020   Hypertension    Ischemic cardiomyopathy    a. EF 46% by nuc 03/2015.   Lung cancer ([place])    Non-small cell carcinoma of lung, stage 4, left ([place]) 06/19/2020   OSA (obstructive sleep apnea) 07/12/2015   Moderate OSA with AHI 16.5/hr now on CPAP at 8cm H2O, uses cpap nightly      RBBB    Shoulder, capsulitis, adhesive    Left Shoulder   Past Surgical History:  Procedure Laterality Date   APPENDECTOMY     BRONCHIAL BIOPSY  05/09/2020   Procedure: BRONCHIAL BIOPSIES;  Surgeon: [name], DO;  Location: [place] [facility];  Service: Pulmonary;;   BRONCHIAL BRUSHINGS  05/09/2020   Procedure: BRONCHIAL BRUSHINGS;  Surgeon: [name], DO;  Location: [place] [facility];  Service: Pulmonary;;   BRONCHIAL NEEDLE ASPIRATION BIOPSY  05/09/2020   Procedure: BRONCHIAL NEEDLE ASPIRATION BIOPSIES;  Surgeon: [name], DO;  Location: [place] [facility];  Service: Pulmonary;;   BRONCHIAL WASHINGS  05/09/2020   Procedure: BRONCHIAL WASHINGS;  Surgeon: [name], DO;  Location: [facility];  Service: Pulmonary;;   CARDIAC CATHETERIZATION N/A 04/05/2015   Procedure: Left Heart Cath and Coronary Angiography;  Surgeon: Sherren Mocha, MD;  Location: [facility];  Service: Cardiovascular;  Laterality: N/A;   COLONOSCOPY     CORONARY ARTERY BYPASS GRAFT     w/LIMA to LAD, seq SVG to OM1 and OM2, SVG to PDA and SVG to Diagonal   CORONARY BALLOON ANGIOPLASTY N/A 01/29/2017   Procedure: Coronary Balloon Angioplasty;  Surgeon: Martinique, Peter M, MD;  Location: [place] [facility];  Service: Cardiovascular;  Laterality: N/A;   FIDUCIAL MARKER PLACEMENT  05/09/2020   Procedure: FIDUCIAL MARKER PLACEMENT;  Surgeon: [name], DO;  Location: [place] [facility];   Service: Pulmonary;;   INTERCOSTAL NERVE BLOCK Left 05/22/2020   Procedure: INTERCOSTAL NERVE BLOCK;  Surgeon: Lajuana Matte, MD;  Location: [place];  Service: Thoracic;  Laterality: Left;   LEFT HEART CATH AND CORS/GRAFTS ANGIOGRAPHY N/A 01/29/2017   Procedure: Left Heart Cath and Cors/Grafts Angiography;  Surgeon: Martinique, Peter M, MD;  Location: [place] [facility];  Service: Cardiovascular;  Laterality: N/A;   NODE DISSECTION N/A 05/22/2020   Procedure: NODE DISSECTION;  Surgeon: Lajuana Matte, MD;  Location: [place];  Service: Thoracic;  Laterality: N/A;   VIDEO BRONCHOSCOPY WITH ENDOBRONCHIAL NAVIGATION N/A 05/09/2020   Procedure: VIDEO BRONCHOSCOPY WITH ENDOBRONCHIAL NAVIGATION;  Surgeon: [name], DO;  Location: [place];  Service: Pulmonary;  Laterality: N/A;   VIDEO BRONCHOSCOPY WITH ENDOBRONCHIAL ULTRASOUND N/A 05/09/2020   Procedure: VIDEO BRONCHOSCOPY WITH ENDOBRONCHIAL ULTRASOUND;  Surgeon: [name], DO;  Location: [place];  Service: Pulmonary;  Laterality: N/A;     Current Meds  Medication Sig   aspirin 81 MG tablet Take 81 mg by mouth at bedtime.    cholecalciferol (VITAMIN D) 1000 UNITS tablet Take 1,000 Units by mouth at bedtime.    clopidogrel (PLAVIX) 75 MG tablet Take 1 tablet (75 mg total) by mouth daily.   Cyanocobalamin (B-12 PO) Take 1,000 mcg by mouth daily.    dexamethasone (DECADRON) 4 MG tablet Take 1 tab two times a day the day before Alimta chemo, then take 2 tabs once a day for 3 days starting the day after cisplatin.   doxazosin (CARDURA) 2 MG tablet TAKE ONE TABLET BY MOUTH DAILY   ezetimibe (ZETIA) 10 MG tablet Take 1 tablet (10 mg total) by mouth daily.   folic acid (FOLVITE) 937 MCG tablet Take 400 mcg by mouth daily.   Multiple Vitamin (MULTIVITAMIN) tablet Take 1 tablet by mouth daily.   nitroGLYCERIN (NITROSTAT) 0.4 MG SL tablet Place 1 tablet (0.4 mg total) under the tongue every 5 (five) minutes as needed for chest pain.    ondansetron (ZOFRAN) 8 MG tablet Take 1 tablet (8 mg total) by mouth 2 (two) times daily as needed (Nausea or vomiting). Start if needed on the third day after cisplatin.   prochlorperazine (COMPAZINE) 10 MG tablet Take 1 tablet (10 mg total) by mouth every 6 (six) hours as needed (Nausea or vomiting).   ramipril (ALTACE) 10 MG capsule TAKE TWO CAPSULES BY MOUTH DAILY   rosuvastatin (CRESTOR) 40 MG tablet TAKE ONE TABLET BY MOUTH EVERY NIGHT AT BEDTIME   sertraline (ZOLOFT) 50 MG tablet Take 50 mg by mouth in the morning and at bedtime.    spironolactone (ALDACTONE) 25 MG tablet TAKE ONE TABLET BY MOUTH DAILY     Allergies:  Amlodipine   Social History   Tobacco Use   Smoking status: Former    Types: Cigarettes    Quit date: 07/21/1983    Years since quitting: 37.7   Smokeless tobacco: Former  Scientific laboratory technician Use: Never used  Substance Use Topics   Alcohol use: Yes    Alcohol/week: 20.0 - 28.0 standard drinks    Types: 20 - 28 Standard drinks or equivalent per week    Comment: daily - 20-28 /week   Drug use: No     Family Hx: The patient's family history includes Cancer in his sister; Heart disease in his father; Heart failure in his father; Lung cancer in his father and mother.  ROS:   Please see the history of present illness.     All other systems reviewed and are negative.   Labs/Other Tests and Data Reviewed:    Recent Labs: 03/19/2021: ALT 18; BUN 15; Creatinine 0.88; Hemoglobin 11.6; Platelet Count 181; Potassium 4.1; Sodium 143; TSH 2.356   Recent Lipid Panel Lab Results  Component Value Date/Time   CHOL 122 02/05/2020 02:25 PM   TRIG 75 02/05/2020 02:25 PM   HDL 40 02/05/2020 02:25 PM   CHOLHDL 3.1 02/05/2020 02:25 PM   CHOLHDL 2.2 03/26/2016 10:09 AM   LDLCALC 67 02/05/2020 02:25 PM    Wt Readings from Last 3 Encounters:  04/08/21 201 lb 9.6 oz (91.4 kg)  03/19/21 207 lb 12.8 oz (94.3 kg)  02/04/21 201 lb 1.9 oz (91.2 kg)     EKG today showed  NSR with RBBB and inferior infarct  Objective:    Vital Signs:  BP 112/74   Pulse 64   Ht 5\' 11"  (1.803 m)   Wt 201 lb 9.6 oz (91.4 kg)   SpO2 97%   BMI 28.12 kg/m    GEN: Well nourished, well developed in no acute distress HEENT: Normal NECK: No JVD; No carotid bruits LYMPHATICS: No lymphadenopathy CARDIAC:RRR, no murmurs, rubs, gallops. Paradoxically split S2 RESPIRATORY:  Clear to auscultation without rales, wheezing or rhonchi  ABDOMEN: Soft, non-tender, non-distended MUSCULOSKELETAL:  No edema; No deformity  SKIN: Warm and dry NEUROLOGIC:  Alert and oriented x 3 PSYCHIATRIC:  Normal affect    ASSESSMENT & PLAN:    1. OSA - The patient is tolerating PAP therapy well without any problems. The PAP download performed by his DME was personally reviewed and interpreted by me today and showed an AHI of 1.2/hr on 8 cm H2O with 90% compliance in using more than 4 hours nightly.  The patient has been using and benefiting from PAP use and will continue to benefit from therapy.    2.  Hypertension -BP is well controlled on exam today -continue prescription drug management with  Doxazosin 2mg  daily, ramipril 20mg  daily, spiro 25mg  daily > refilled   -check BMET  3.  ASCAD  - s/p CABG and then PCI of SVG to OM1.   -Repeat heart cath for unstable angina on 01/27/2017 showed severe focal in-stent restenosis in the SVG to OM stent status post balloon angioplasty.  All other grafts were patent.   -he has not had any anginal chest pain -Continue prescription drug management with ASA 81mg  daily, Plavix 75mg  daily and statin>>refilled  4.  Hyperlipidemia  -his LDL goal is < 70.   -check FLP and ALT -Continue prescription drug management with Crestor 40mg  daily and Zetia 10mg  daily>refilled   5.  DOE/Lung CA -2D echo 02/2021 showed mild LV  dysfunction with EF 40-45%, dilated LV -cMRI is pending next week -no ischemia on Lexisan myoview in 02/2020 -PFTs were normal -dx with stage 4a  adenoCA of the LUL lung s/p resection now s/p chemo with Carbo/Alimta/Pembrolizumab and on maintenance with Pembrolizumab   Medication Adjustments/Labs and Tests Ordered: Current medicines are reviewed at length with the patient today.  Concerns regarding medicines are outlined above.  Tests Ordered: Orders Placed This Encounter  Procedures   EKG 12-Lead    Medication Changes: No orders of the defined types were placed in this encounter.   Disposition:  Follow up in 1 year(s)  Signed, Fransico Him, MD  04/08/2021 10:11 AM    [facility]

## 2021-04-08 NOTE — Patient Instructions (Signed)
Medication Instructions:  Your physician recommends that you continue on your current medications as directed. Please refer to the Current Medication list given to you today.  *If you need a refill on your cardiac medications before your next appointment, please call your pharmacy*   Lab Work: TOD[place]: CMET and FLP If you have labs (blood work) drawn today and your tests are completely normal, you will receive your results only by: [facility] (if you have MyChart) OR A paper copy in the mail If you have any lab test that is abnormal or we need to change your treatment, we will call you to review the results.   Follow-Up: At [facility], you and your health needs are our priority.  As part of our continuing mission to provide you with exceptional heart care, we have created designated Provider Care Teams.  These Care Teams include your primary Cardiologist (physician) and Advanced Practice Providers (APPs -  Physician Assistants and Nurse Practitioners) who all work together to provide you with the care you need, when you need it.   Your next appointment:   1 year(s)  The format for your next appointment:   In Person  Provider:   You may see Fransico Him, MD or one of the following Advanced Practice Providers on your designated Care Team:   Melina Copa, PA-C Ermalinda Barrios, PA-C

## 2021-04-09 DIAGNOSIS — F411 Generalized anxiety disorder: Secondary | ICD-10-CM | POA: Diagnosis not present

## 2021-04-09 DIAGNOSIS — F4323 Adjustment disorder with mixed anxiety and depressed mood: Secondary | ICD-10-CM | POA: Diagnosis not present

## 2021-04-09 DIAGNOSIS — F40228 Other natural environment type phobia: Secondary | ICD-10-CM | POA: Diagnosis not present

## 2021-04-16 ENCOUNTER — Telehealth (HOSPITAL_COMMUNITY): Payer: Self-pay | Admitting: *Deleted

## 2021-04-16 NOTE — Telephone Encounter (Signed)
Reaching out to patient to offer assistance regarding upcoming cardiac imaging study; pt verbalizes understanding of appt date/time, parking situation and where to check in, and verified current allergies; name and call back number provided for further questions should they arise  Yunuen Mordan RN Navigator Cardiac Imaging [facility] [phone] office [phone] cell  

## 2021-04-17 ENCOUNTER — Other Ambulatory Visit: Payer: Self-pay

## 2021-04-17 ENCOUNTER — Ambulatory Visit (HOSPITAL_COMMUNITY)
Admission: RE | Admit: 2021-04-17 | Discharge: 2021-04-17 | Disposition: A | Discharge status: Home / Self Care | Payer: Medicare Other | Source: Ambulatory Visit | Attending: Cardiology | Admitting: Cardiology

## 2021-04-17 DIAGNOSIS — I420 Dilated cardiomyopathy: Secondary | ICD-10-CM | POA: Diagnosis not present

## 2021-04-17 IMAGING — MR MR CARD MORPHOLOGY WO/W CM
45 of 48 series · 45 of 48 positions shown · IV contrast (gadavist)
Comparison: none

CLINICAL DATA: Cardiomyopathy evaluation

EXAM:
CARDIAC MRI
TECHNIQUE: The patient was scanned on a 1.5 Tesla Siemens magnet. A dedicated
cardiac coil was used. Functional imaging was done using [employer]
sequences. [DATE], and 4 chamber views were done to assess for RWMA's.
Modified ACHILLEAS rule using a short axis stack was used to
calculate an ejection fraction on a dedicated work station using
Circle software. The patient received 9 cc of Gadavist. After 10
minutes inversion recovery sequences were used to assess for
infiltration and scar tissue.
CONTRAST:  9 cc  of Gadavist

[Series 4: t2_haste_db_tra_bh · axial · 8.0mm · 1.41mm/px · 1 of 20 slices shown]
[im 1/20]
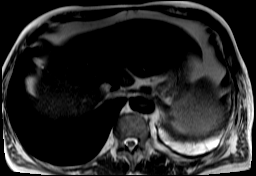

[Series 8: bSSFP · sagittal · 8.0mm · 1.61mm/px · 1 of 25 slices shown (1 of 21)]
[im 1/25]
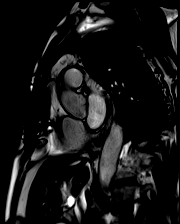

[Series 9: bSSFP · sagittal · 8.0mm · 1.61mm/px · 1 of 25 slices shown (2 of 21)]
[im 1/25]
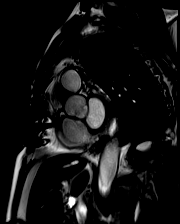

[Series 10: bSSFP · sagittal · 8.0mm · 1.61mm/px · 1 of 25 slices shown (3 of 21)]
[im 1/25]
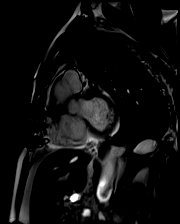

[Series 11: bSSFP · sagittal · 8.0mm · 1.61mm/px · 1 of 25 slices shown (4 of 21)]
[im 1/25]
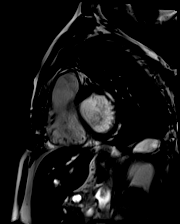

[Series 12: bSSFP · sagittal · 8.0mm · 1.61mm/px · 1 of 25 slices shown (5 of 21)]
[im 1/25]
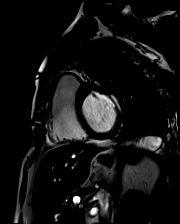

[Series 13: bSSFP · sagittal · 8.0mm · 1.61mm/px · 1 of 25 slices shown (6 of 21)]
[im 1/25]
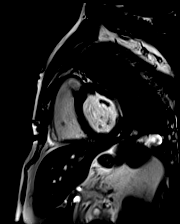

[Series 14: bSSFP · sagittal · 8.0mm · 1.61mm/px · 1 of 25 slices shown (7 of 21)]
[im 1/25]
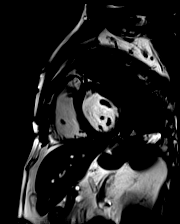

[Series 15: bSSFP · sagittal · 8.0mm · 1.61mm/px · 1 of 25 slices shown (8 of 21)]
[im 1/25]
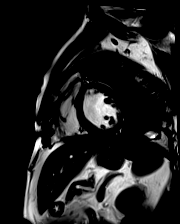

[Series 16: bSSFP · sagittal · 8.0mm · 1.61mm/px · 1 of 25 slices shown (9 of 21)]
[im 1/25]
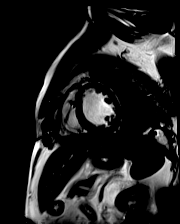

[Series 17: bSSFP · sagittal · 8.0mm · 1.61mm/px · 1 of 25 slices shown (10 of 21)]
[im 1/25]
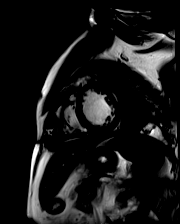

[Series 18: bSSFP · sagittal · 8.0mm · 1.61mm/px · 1 of 25 slices shown (11 of 21)]
[im 1/25]
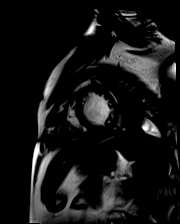

[Series 19: bSSFP · sagittal · 8.0mm · 1.61mm/px · 1 of 25 slices shown (12 of 21)]
[im 1/25]
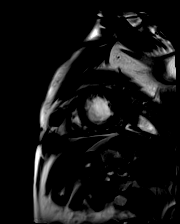

[Series 20: bSSFP · sagittal · 8.0mm · 1.61mm/px · 1 of 25 slices shown (13 of 21)]
[im 1/25]
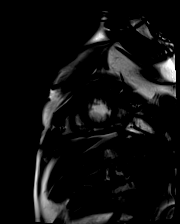

[Series 21: bSSFP · sagittal · 8.0mm · 1.61mm/px · 1 of 25 slices shown (14 of 21)]
[im 1/25]
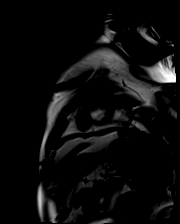

[Series 22: bSSFP · sagittal · 8.0mm · 1.61mm/px · 1 of 25 slices shown (15 of 21)]
[im 1/25]
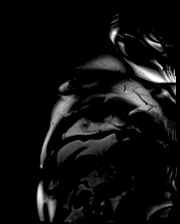

[Series 23: bSSFP · sagittal · 8.0mm · 1.61mm/px · 1 of 25 slices shown (16 of 21)]
[im 1/25]
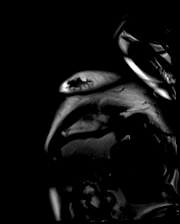

[Series 24: bSSFP · sagittal · 8.0mm · 1.61mm/px · 1 of 25 slices shown (17 of 21)]
[im 1/25]
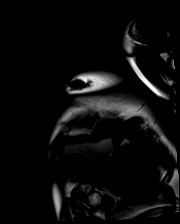

[Series 25: bSSFP · axial · 6.0mm · 1.41mm/px · 1 of 25 slices shown (18 of 21)]
[im 1/25]
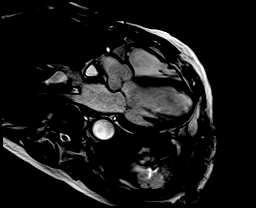

[Series 26: bSSFP · coronal · 6.0mm · 1.41mm/px · 1 of 25 slices shown (19 of 21)]
[im 1/25]
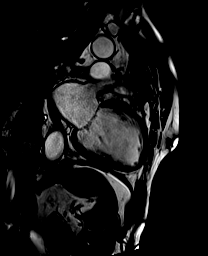

[Series 27: bSSFP · axial · 6.0mm · 1.41mm/px · 1 of 25 slices shown (20 of 21)]
[im 1/25]
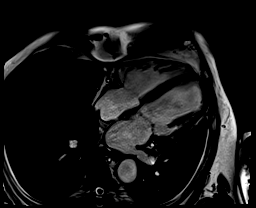

[Series 28: (id)_long_t1 · sagittal · 8.0mm · 1.56mm/px · 1 of 24 slices shown]
[im 1/24]
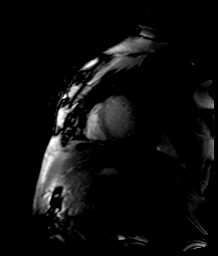

[Series 29: (id)_long_t1_moco · sagittal · 8.0mm · 1.56mm/px · 1 of 24 slices shown]
[im 1/24]
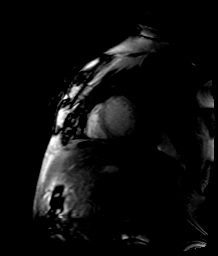

[Series 30: (id)_long_t1_moco_t1 · sagittal · 8.0mm · 1.56mm/px · 1 of 6 slices shown]
[im 1/6]
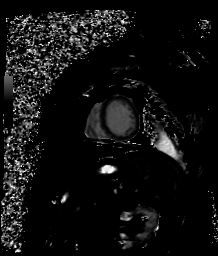

[Series 32: (id)_trufi · sagittal · 8.0mm · 2.08mm/px · 1 of 9 slices shown]
[im 1/9]
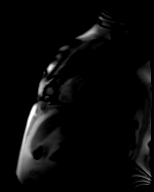

[Series 33: (id)_trufi_moco · sagittal · 8.0mm · 2.08mm/px · 1 of 9 slices shown]
[im 1/9]
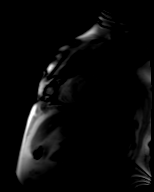

[Series 36: pre short axis · sagittal · non-contrast · 8.0mm · 2.25mm/px · 1 of 10 slices shown (1 of 6)]
[im 1/10]
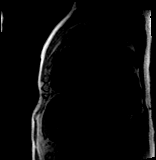

[Series 37: pre short axis · sagittal · non-contrast · 8.0mm · 2.25mm/px · 1 of 10 slices shown (2 of 6)]
[im 1/10]
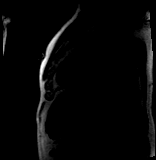

[Series 38: pre short axis · sagittal · non-contrast · 8.0mm · 2.25mm/px · 1 of 10 slices shown (3 of 6)]
[im 1/10]
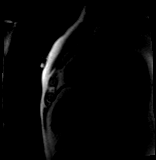

[Series 39: pre short axis · sagittal · non-contrast · 8.0mm · 2.25mm/px · 1 of 10 slices shown (4 of 6)]
[im 1/10]
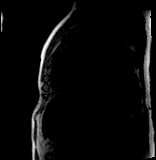

[Series 40: pre short axis · sagittal · non-contrast · 8.0mm · 2.25mm/px · 1 of 10 slices shown (5 of 6)]
[im 1/10]
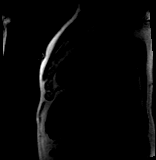

[Series 41: pre short axis · sagittal · non-contrast · 8.0mm · 2.25mm/px · 1 of 10 slices shown (6 of 6)]
[im 1/10]
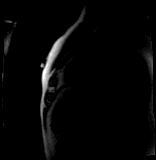

[Series 42: rest short axis · sagittal · 8.0mm · 2.25mm/px · 1 of 60 slices shown (1 of 6)]
[im 1/60]
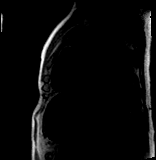

[Series 43: rest short axis · sagittal · 8.0mm · 2.25mm/px · 1 of 60 slices shown (2 of 6)]
[im 1/60]
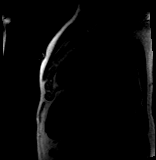

[Series 44: rest short axis · sagittal · 8.0mm · 2.25mm/px · 1 of 60 slices shown (3 of 6)]
[im 1/60]
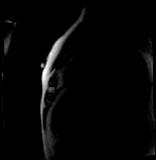

[Series 45: rest short axis · sagittal · 8.0mm · 2.25mm/px · 1 of 60 slices shown (4 of 6)]
[im 1/60]
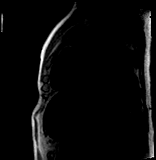

[Series 46: rest short axis · sagittal · 8.0mm · 2.25mm/px · 1 of 60 slices shown (5 of 6)]
[im 1/60]
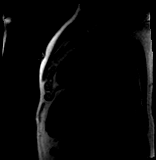

[Series 47: rest short axis · sagittal · 8.0mm · 2.25mm/px · 1 of 60 slices shown (6 of 6)]
[im 1/60]
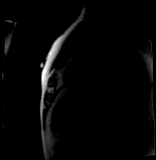

[Series 48: bSSFP · coronal · 6.0mm · 1.41mm/px · 1 of 25 slices shown (21 of 21)]
[im 1/25]
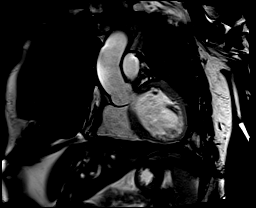

[Series 49: aortic valve cine · oblique · 6.0mm · 1.41mm/px · 1 of 25 slices shown]
[im 1/25]
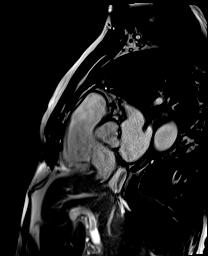

[Series 50: cine rvit · oblique · 6.0mm · 1.41mm/px · 1 of 25 slices shown]
[im 1/25]
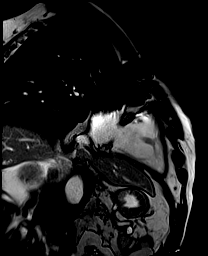

[Series 51: cine rvot · sagittal · 6.0mm · 1.41mm/px · 1 of 25 slices shown]
[im 1/25]
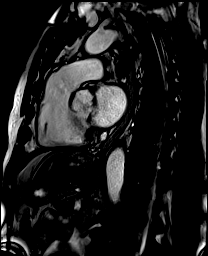

[Series 53: lge_single shot sa · oblique · 8.0mm · 2.08mm/px · 1 of 18 slices shown (1 of 2)]
[im 1/18]
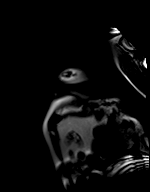

[Series 54: lge_single shot sa · oblique · 8.0mm · 2.08mm/px · 1 of 18 slices shown (2 of 2)]
[im 1/18]
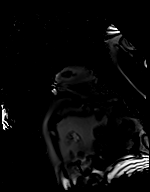

[Series 57: lge_single shot 4 · axial · 6.0mm · 1.98mm/px · 1 of 1 slices shown]
[im 1/1]
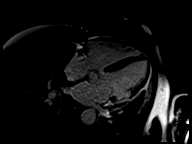

[45 of 48 positions shown; findings below may reference images not displayed]

FINDINGS: Left ventricle:

-Mild dilatation

-Moderate systolic dysfunction. Significant thinning/dyskinesis of
focal region of basal to mid lateral wall

-Nonspecific ECV elevation (31%)

-Basal septal midwall LGE

-Subendocardial LGE in focal region of basal to mid lateral wall.
LGE >50% transmural

-RV insertion site LGE

LV EF: 39% (Normal 56-78%)

Absolute volumes:

LV EDV: 228mL (Normal 77-195 mL)

LV ESV: 139mL (Normal 19-72 mL)

LV SV: 90mL (Normal 51-133 mL)

CO: 5.9L/min (Normal 2.8-8.8 L/min)

Indexed volumes:

LV EDV: 107mL/sq-m (Normal 47-92 mL/sq-m)

LV ESV: 65mL/sq-m (Normal 13-30 mL/sq-m)

LV SV: 42mL/sq-m (Normal 32-62 mL/sq-m)

CI: 2.7L/min/sq-m (Normal 1.7-4.2 L/min/sq-m)

Right ventricle: Normal size with mild systolic dysfunction

RV EF:  42% (Normal 47-74%)

Absolute volumes:

RV EDV: 215mL (Normal 88-227 mL)

RV ESV: 124mL (Normal 23-103 mL)

RV SV: 91mL (Normal 52-138 mL)

CO: 5.9L/min (Normal 2.8-8.8 L/min)

Indexed volumes:

RV EDV: 100mL/sq-m (Normal 55-105 mL/sq-m)

RV ESV: 58mL/sq-m (Normal 15-43 mL/sq-m)

RV SV: 43mL/sq-m (Normal 32-64 mL/sq-m)

CI: 2.8L/min/sq-m (Normal 1.7-4.2 L/min/sq-m)

Left atrium: Moderate enlargement

Right atrium: Mild enlargement

Mitral valve: Trivial regurgitation

Aortic valve: Tricuspid. No regurgitation

Tricuspid valve: Trivial regurgitation

Pulmonic valve: No regurgitation

Aorta: Normal proximal ascending aorta

Pericardium: Normal
IMPRESSION: 1. Mild LV dilatation with moderate systolic dysfunction (EF 39%).
Significant thinning/dyskinesis of focal region of basal to mid
lateral wall

2. Mixed cardiomyopathy with evidence of both ischemic and
nonischemic etiologies

3. Subendocardial late gadolinium enhancement consistent with prior
infarct in focal region of basal to mid lateral wall. LGE is greater
than 50% transmural suggesting this region is not viable

4. Basal septal midwall LGE, which is a scar pattern seen in
nonischemic cardiomyopathies and associated with worse prognosis

5. RV insertion site LGE, which is a nonspecific scar pattern often
seen in setting of elevated pulmonary pressures

6.  Normal RV size with mild systolic dysfunction (EF 42%)

## 2021-04-17 MED ORDER — GADOBUTROL 1 MMOL/ML IV SOLN
9.0000 mL | Freq: Once | INTRAVENOUS | Status: AC | PRN
  Administered 2021-04-17: 9 mL via INTRAVENOUS

## 2021-04-20 ENCOUNTER — Encounter: Payer: Self-pay | Admitting: Cardiology

## 2021-04-23 ENCOUNTER — Encounter (HOSPITAL_COMMUNITY)
Admission: RE | Admit: 2021-04-23 | Discharge: 2021-04-23 | Disposition: A | Discharge status: Home / Self Care | Payer: Medicare Other | Source: Ambulatory Visit | Attending: Hematology & Oncology | Admitting: Hematology & Oncology

## 2021-04-23 ENCOUNTER — Other Ambulatory Visit: Payer: Self-pay

## 2021-04-23 DIAGNOSIS — C3492 Malignant neoplasm of unspecified part of left bronchus or lung: Secondary | ICD-10-CM | POA: Insufficient documentation

## 2021-04-23 DIAGNOSIS — C7972 Secondary malignant neoplasm of left adrenal gland: Secondary | ICD-10-CM | POA: Insufficient documentation

## 2021-04-23 DIAGNOSIS — I517 Cardiomegaly: Secondary | ICD-10-CM | POA: Insufficient documentation

## 2021-04-23 DIAGNOSIS — Z902 Acquired absence of lung [part of]: Secondary | ICD-10-CM | POA: Insufficient documentation

## 2021-04-23 DIAGNOSIS — Z951 Presence of aortocoronary bypass graft: Secondary | ICD-10-CM | POA: Insufficient documentation

## 2021-04-23 DIAGNOSIS — J320 Chronic maxillary sinusitis: Secondary | ICD-10-CM | POA: Insufficient documentation

## 2021-04-23 DIAGNOSIS — C7971 Secondary malignant neoplasm of right adrenal gland: Secondary | ICD-10-CM | POA: Insufficient documentation

## 2021-04-23 DIAGNOSIS — I700 Atherosclerosis of aorta: Secondary | ICD-10-CM | POA: Diagnosis not present

## 2021-04-23 DIAGNOSIS — I2510 Atherosclerotic heart disease of native coronary artery without angina pectoris: Secondary | ICD-10-CM | POA: Diagnosis not present

## 2021-04-23 DIAGNOSIS — C7951 Secondary malignant neoplasm of bone: Secondary | ICD-10-CM | POA: Diagnosis not present

## 2021-04-23 DIAGNOSIS — K5730 Diverticulosis of large intestine without perforation or abscess without bleeding: Secondary | ICD-10-CM | POA: Diagnosis not present

## 2021-04-23 DIAGNOSIS — C3490 Malignant neoplasm of unspecified part of unspecified bronchus or lung: Secondary | ICD-10-CM | POA: Diagnosis not present

## 2021-04-23 LAB — GLUCOSE, CAPILLARY: Glucose-Capillary: 98 mg/dL (ref 70–99)

## 2021-04-23 IMAGING — CT NM PET TUM IMG RESTAG (PS) SKULL BASE T - THIGH
1 of 9 series · 2 of 25 positions shown · non-contrast
Comparison: Multiple exams, including [DATE]

CLINICAL DATA: Subsequent treatment strategy for non-small cell
lung cancer.

EXAM:
NUCLEAR MEDICINE PET SKULL BASE TO THIGH
TECHNIQUE: 10.0 mCi F-18 FDG was injected intravenously. Full-ring PET imaging
was performed from the skull base to thigh after the radiotracer. CT
data was obtained and used for attenuation correction and anatomic
localization.
Fasting blood glucose: 98 mg/dl

[Series 4: ct sk_thigh 5.0 hd_fov · axial · 5.0mm · 1.52mm/px · z∈[-442,-210]mm · 2 of 232 slices shown]
[im 58/232  brain]
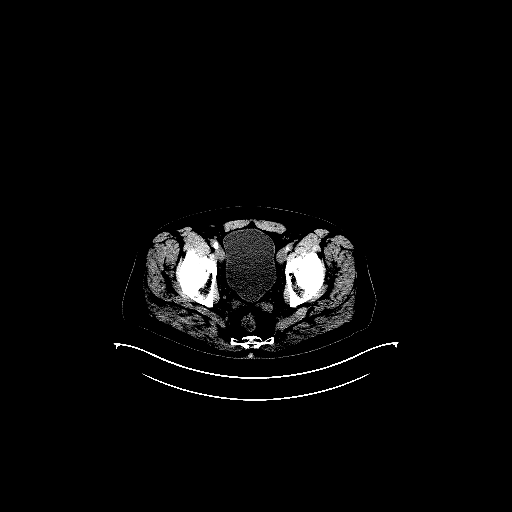
[im 116/232  brain]
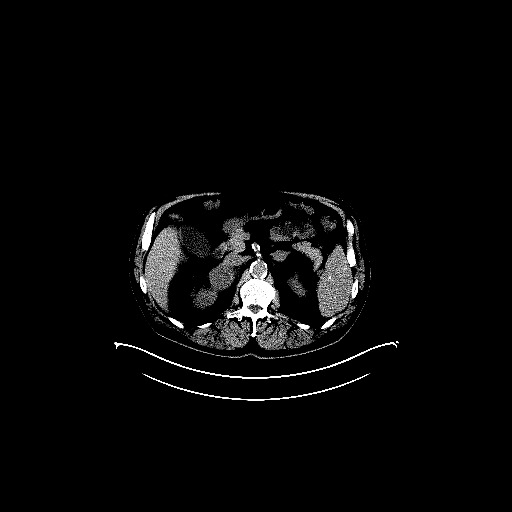

[2 of 25 positions shown; findings below may reference images not displayed]

FINDINGS: Mediastinal blood pool activity: SUV max

Liver activity: SUV max NA

NECK: Substantial activity in the anterior tongue with maximum SUV
13.4 (formerly 14.7, no underlying CT abnormality, most likely
physiologic.

Incidental CT findings: Mild bilateral chronic maxillary sinusitis.
Bilateral common carotid atherosclerotic calcification.

CHEST: A new 0.9 by 0.7 cm right lower lobe pulmonary nodule
adjacent to the diaphragm on image 44 series 8 has a maximum SUV of
2.5.

A new 0.6 by 0.4 cm right upper lobe nodule on image 14 series 8 has
maximum SUV only 1.3 but is below sensitive PET-CT size thresholds.
Other even smaller new nodularity in the right upper lobe noted
including a 0.5 by 0.3 cm nodule on image 11 series 8.

Calcified nodule in the posterior basal segment right lower lobe not
hypermetabolic.

The reduction in the left pleural effusion is noted with a
peripheral area of probable rounded atelectasis in the left lower
lobe measuring 2.7 by 1.2 cm on image 56 series 8 with maximum SUV
2.8. Malignancy is a differential diagnostic consideration.

Incidental CT findings: Left upper lobectomy. Old granulomatous
disease. Mild cardiomegaly. Coronary, aortic arch, and branch vessel
atherosclerotic vascular disease. Prior CABG.

ABDOMEN/PELVIS: New right adrenal mass 8.8 by 4.9 cm on image 107
series 4, maximum SUV 20.9, compatible with metastatic lesion. This
indents the margin of the liver.

New left adrenal mass, 1.9 by 2.1 cm, maximum SUV 22.4, compatible
with malignancy.

Accentuated bowel activity without CT correlate, likely physiologic,
particularly in the right colon.

Incidental CT findings: Old granulomatous disease. Atherosclerosis
is present, including aortoiliac atherosclerotic disease. Sigmoid
colon diverticulosis.

SKELETON: Scattered new osseous metastatic lesions are identified
including the T1 vertebra, right scapula, right fourth rib, left
seventh rib, right T10 vertebral body, and left iliac bone adjacent
to the SI joint. Index lesion eccentric to the right at the T10
vertebral level has maximum SUV of 10.9 and is predominantly lytic
with a mild sclerotic rim.

Incidental CT findings: none
IMPRESSION: 1. New metastatic disease including the right lung, bilateral
adrenal glands, and multiple locations in the skeleton as noted
above.
2. Prior left upper lobectomy with some evolutionary findings in the
left lung including reduced left pleural effusion and resolution of
a region of probable rounded atelectasis in the left lower lobe
(less likely to be tumor).
3. Aortic Atherosclerosis ([DG]-[DG]). Coronary atherosclerosis.
Sigmoid colon diverticulosis. Chronic bilateral maxillary sinusitis.
Old granulomatous disease. Prior CABG. Mild cardiomegaly.

## 2021-04-23 MED ORDER — FLUDEOXYGLUCOSE F - 18 (FDG) INJECTION
10.0000 | Freq: Once | INTRAVENOUS | Status: AC
  Administered 2021-04-23: 10 via INTRAVENOUS

## 2021-04-25 ENCOUNTER — Inpatient Hospital Stay: Payer: Medicare Other | Attending: Hematology & Oncology | Admitting: Hematology & Oncology

## 2021-04-25 ENCOUNTER — Encounter: Payer: Self-pay | Admitting: Hematology & Oncology

## 2021-04-25 ENCOUNTER — Other Ambulatory Visit (HOSPITAL_COMMUNITY): Payer: Self-pay

## 2021-04-25 ENCOUNTER — Telehealth: Payer: Self-pay

## 2021-04-25 ENCOUNTER — Encounter: Payer: Self-pay | Admitting: *Deleted

## 2021-04-25 ENCOUNTER — Other Ambulatory Visit: Payer: Self-pay

## 2021-04-25 VITALS — BP 119/65 | HR 65 | Temp 98.0°F | Resp 18 | Wt 201.0 lb

## 2021-04-25 DIAGNOSIS — C7951 Secondary malignant neoplasm of bone: Secondary | ICD-10-CM | POA: Diagnosis not present

## 2021-04-25 DIAGNOSIS — Z79899 Other long term (current) drug therapy: Secondary | ICD-10-CM | POA: Diagnosis not present

## 2021-04-25 DIAGNOSIS — I420 Dilated cardiomyopathy: Secondary | ICD-10-CM

## 2021-04-25 DIAGNOSIS — C3492 Malignant neoplasm of unspecified part of left bronchus or lung: Secondary | ICD-10-CM | POA: Diagnosis not present

## 2021-04-25 DIAGNOSIS — I519 Heart disease, unspecified: Secondary | ICD-10-CM

## 2021-04-25 DIAGNOSIS — C3412 Malignant neoplasm of upper lobe, left bronchus or lung: Secondary | ICD-10-CM | POA: Diagnosis not present

## 2021-04-25 DIAGNOSIS — R0602 Shortness of breath: Secondary | ICD-10-CM

## 2021-04-25 MED ORDER — ENTRESTO 24-26 MG PO TABS: 1.0000 | ORAL_TABLET | Freq: Two times a day (BID) | ORAL | 11 refills | Status: DC

## 2021-04-25 MED ORDER — LUMAKRAS 120 MG PO TABS
960.0000 mg | ORAL_TABLET | Freq: Every day | ORAL | 5 refills | Status: DC
  Filled 2021-04-25: qty 240, fill #0

## 2021-04-25 NOTE — Progress Notes (Signed)
Reviewed PET scan with Dr Marin Olp which shows significant new disease. Dr Marin Olp would like to have patient come in this afternoon for review and discussion.   Called patient and scheduled appointment for this afternoon.   Oncology Nurse Navigator Documentation  Oncology Nurse Navigator Flowsheets 04/25/2021  Abnormal Finding Date -  Confirmed Diagnosis Date -  Diagnosis Status -  Planned Course of Treatment -  Phase of Treatment -  Chemotherapy Actual Start Date: -  Chemotherapy Actual End Date: -  Surgery Actual Start Date: -  Navigator Follow Up Date: 04/25/2021  Navigator Follow Up Reason: Follow-up Appointment  Navigator Location [facility]  Referral Date to RadOnc/MedOnc -  Navigator Encounter Type Scan Review;Telephone  Telephone Appt Confirmation/Clarification;Outgoing Call  Treatment Initiated Date -  Patient Visit Type MedOnc  Treatment Phase Active Tx  Barriers/Navigation Needs Coordination of Care;Education  Education -  Interventions Coordination of Care;Education;Psycho-Social Support  Acuity Level 2-Minimal Needs (1-2 Barriers Identified)  Coordination of Care Appts  Education Method -  Support Groups/Services Friends and Family  Time Spent with Patient 30

## 2021-04-25 NOTE — Progress Notes (Signed)
Hematology and Oncology Follow Up Visit  Brady Schiller [id number] 1945-10-29 75 y.o. 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 Lumakras 960 mg po q day -- start on 05/02/2021     Interim History:  Mr. Voland is back for an unscheduled visit.  Unfortunately, he also has progressive disease.  We did go ahead and do a PET scan.  This was done a couple days ago.  He now has metastasis.  He has disease in both adrenal glands.  He has disease in his bones.  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  He says that his back does bother him.  He said it hurt after he started playing golf again.  I suspect that his disease recurrence is secondary to the fact that he has a K-ras mutation.  This has been clearly shown to increase the risk of recurrence and to decrease the response to chemotherapy.  Because of the K-ras mutation, we will go ahead and put him on Lumakras.  We will put him on 960 mg daily.  I do think we will get have to get an MRI of his back and also get MRI of the brain to make sure he does not have any asymptomatic brain metastasis.  I must say that he has a large right adrenal met.  This measures 8.8 x 4.9 cm.  This clearly was not present on his past PET scan which was done in May.  He has had no problems with his bladder.  He does have diarrhea.  He says he goes 5-6 times a day.  He does not take anything for this.  I told him we could try some Imodium for this.  There is been no problems with his appetite.  He has had no cough.  He has had some shortness of breath.  He did have a cardiac MRI.  This shows that he has a decreased left ventricular ejection fraction of 39%.  This could certainly be secondary to  the fact that he had immunotherapy.  I know there is increasing the literature that shows that immunotherapy can have an effect on cardiac function.   I am just very disappointed that he has had recurrent disease already.  Again, I think everything is being driven by the K-ras mutation.  Thankfully, I saw is a very good performance status of ECOG 1.    Medications:  Current Outpatient Medications:    aspirin 81 MG tablet, Take 81 mg by mouth at bedtime. , Disp: , Rfl:    cholecalciferol (VITAMIN D) 1000 UNITS tablet, Take 1,000 Units by mouth at bedtime. , Disp: , Rfl:    clopidogrel (PLAVIX) 75 MG tablet, Take 1 tablet (75 mg total) by mouth daily., Disp: 90 tablet, Rfl: 3   Cyanocobalamin (B-12 PO), Take 1,000 mcg by mouth daily. , Disp: , Rfl:    dexamethasone (DECADRON) 4 MG tablet, Take 1 tab two times a day the day before Alimta chemo, then take 2 tabs once a day for 3 days starting the day after cisplatin., Disp: 30 tablet, Rfl: 1   doxazosin (CARDURA) 2 MG tablet, TAKE ONE TABLET BY MOUTH DAILY, Disp: 90 tablet, Rfl: 0   ezetimibe (ZETIA) 10 MG tablet, Take 1 tablet (10 mg total)  by mouth daily., Disp: 90 tablet, Rfl: 3   folic acid (FOLVITE) 161 MCG tablet, Take 400 mcg by mouth daily., Disp: , Rfl:    Multiple Vitamin (MULTIVITAMIN) tablet, Take 1 tablet by mouth daily., Disp: , Rfl:    nitroGLYCERIN (NITROSTAT) 0.4 MG SL tablet, Place 1 tablet (0.4 mg total) under the tongue every 5 (five) minutes as needed for chest pain., Disp: 25 tablet, Rfl: 3   ondansetron (ZOFRAN) 8 MG tablet, Take 1 tablet (8 mg total) by mouth 2 (two) times daily as needed (Nausea or vomiting). Start if needed on the third day after cisplatin., Disp: 30 tablet, Rfl: 1   prochlorperazine (COMPAZINE) 10 MG tablet, Take 1 tablet (10 mg total) by mouth every 6 (six) hours as needed (Nausea or vomiting)., Disp: 30 tablet, Rfl: 1   ramipril (ALTACE) 10 MG capsule, TAKE TWO CAPSULES BY MOUTH DAILY, Disp: 180 capsule,  Rfl: 3   rosuvastatin (CRESTOR) 40 MG tablet, TAKE ONE TABLET BY MOUTH EVERY NIGHT AT BEDTIME, Disp: 90 tablet, Rfl: 0   sertraline (ZOLOFT) 50 MG tablet, Take 50 mg by mouth in the morning and at bedtime. , Disp: , Rfl:    spironolactone (ALDACTONE) 25 MG tablet, TAKE ONE TABLET BY MOUTH DAILY, Disp: 90 tablet, Rfl: 3  Allergies:  Allergies  Allergen Reactions   Amlodipine Swelling    Swelling of ankles    Past Medical History, Surgical history, Social history, and Family History were reviewed and updated.  Review of Systems: Review of Systems  Constitutional: Negative.   HENT:  Negative.    Eyes: Negative.   Respiratory: Negative.    Cardiovascular: Negative.   Gastrointestinal:  Positive for constipation.  Endocrine: Negative.   Genitourinary: Negative.    Musculoskeletal: Negative.   Skin: Negative.   Neurological: Negative.   Hematological: Negative.   Psychiatric/Behavioral: Negative.     Physical Exam:  weight is 201 lb (91.2 kg). His oral temperature is 98 F (36.7 C). His blood pressure is 119/65 and his pulse is 65. His respiration is 18 and oxygen saturation is 99%.   Wt Readings from Last 3 Encounters:  04/25/21 201 lb (91.2 kg)  04/08/21 201 lb 9.6 oz (91.4 kg)  03/19/21 207 lb 12.8 oz (94.3 kg)    Physical Exam Vitals reviewed.  HENT:     Head: Normocephalic and atraumatic.  Eyes:     Pupils: Pupils are equal, round, and reactive to light.  Cardiovascular:     Rate and Rhythm: Normal rate and regular rhythm.     Heart sounds: Normal heart sounds.  Pulmonary:     Effort: Pulmonary effort is normal.     Breath sounds: Normal breath sounds.  Abdominal:     General: Bowel sounds are normal.     Palpations: Abdomen is soft.  Musculoskeletal:        General: No tenderness or deformity. Normal range of motion.     Cervical back: Normal range of motion.  Lymphadenopathy:     Cervical: No cervical adenopathy.  Skin:    General: Skin is warm and dry.      Findings: No erythema or rash.  Neurological:     Mental Status: He is alert and oriented to person, place, and time.  Psychiatric:        Behavior: Behavior normal.        Thought Content: Thought content normal.        Judgment: Judgment normal.     Lab Results  Component Value Date   WBC 6.7 03/19/2021   HGB 11.6 (L) 03/19/2021   HCT 36.7 (L) 03/19/2021   MCV 95.8 03/19/2021   PLT 181 03/19/2021     Chemistry      Component Value Date/Time   NA 139 04/08/2021 1032   K 4.5 04/08/2021 1032   CL 99 04/08/2021 1032   CO2 24 04/08/2021 1032   BUN 15 04/08/2021 1032   CREATININE 0.80 04/08/2021 1032   CREATININE 0.88 03/19/2021 0814   CREATININE 1.01 03/26/2016 1009      Component Value Date/Time   CALCIUM 9.7 04/08/2021 1032   ALKPHOS 97 04/08/2021 1032   AST 25 04/08/2021 1032   AST 23 03/19/2021 0814   ALT 19 04/08/2021 1032   ALT 18 03/19/2021 0814   BILITOT 0.3 04/08/2021 1032   BILITOT 0.3 03/19/2021 0814      Impression and Plan: Mr. Koegel is a very nice 75 year old white male.  He certainly looks a lot younger.  He had an atypical stage IV lung cancer which is disease has been resected.  He received systemic chemotherapy which I really thought was "adjuvant" therapy.  He then was on maintenance pembrolizumab.  Again, he has had recurrent disease. This is quite distressing.  I just hate the fact that he has recurred.  He is recurred systemically.  He will definitely need Xgeva in addition to the [place].  We will have to see if he is going to any radiation therapy depending on what the MRI has to show.  Will get MRI of the brain and also MRI of the thoracic and lumbar spine.  This is complicated.  I spent a good 45 minutes or so with he and his wife.  I know them well.  I just want him his quality of life to be good.  I told him that the Lumakras should work.  However, I also told him that the cancer will recur because we now know that these lung  cancers tend to find a way to become resistant to [place].  For right now, we will plan to get him back to see [place] in another 3 or 4 weeks.  We will give him the Xgeva when we see him back.   [name], MD 10/7/20224:00 PM

## 2021-04-25 NOTE — Telephone Encounter (Signed)
Patient called the [facility] [employer]. States that he is returning a call to [employer], RN.  [phone]

## 2021-04-25 NOTE — Telephone Encounter (Signed)
Cardiac MRI results reviewed with patient. He is agreeable to plan to repeat lexiscan myoview for evaluation of ischemia and to d/c Altace and start Entresto 24-26 mg BID. Questions were answered to his satisfaction with the exception of he would like Dr. [name] to evaluate his recent PET scan for awareness that his cancer has returned. I advised that I will forward the message to Dr. [name] for review. He is aware to d/c Altace for 36 hours prior to starting Entresto. He has a lab appointment on 10/17 and an appointment with the Pharm D on 10/25. He is aware someone will call him to schedule the myoview. He was grateful for the call and my assistance.

## 2021-04-27 ENCOUNTER — Encounter: Payer: Self-pay | Admitting: *Deleted

## 2021-04-28 ENCOUNTER — Telehealth: Payer: Self-pay | Admitting: Pharmacy Technician

## 2021-04-28 ENCOUNTER — Other Ambulatory Visit (HOSPITAL_COMMUNITY): Payer: Self-pay

## 2021-04-28 ENCOUNTER — Other Ambulatory Visit: Payer: Self-pay | Admitting: Pharmacist

## 2021-04-28 ENCOUNTER — Encounter: Payer: Self-pay | Admitting: Hematology & Oncology

## 2021-04-28 ENCOUNTER — Telehealth: Payer: Self-pay | Admitting: Pharmacist

## 2021-04-28 ENCOUNTER — Encounter: Payer: Self-pay | Admitting: *Deleted

## 2021-04-28 DIAGNOSIS — C3492 Malignant neoplasm of unspecified part of left bronchus or lung: Secondary | ICD-10-CM

## 2021-04-28 MED ORDER — TRAMADOL HCL 50 MG PO TABS: 50.0000 mg | ORAL_TABLET | Freq: Four times a day (QID) | ORAL | 0 refills | Status: DC | PRN

## 2021-04-28 MED ORDER — LUMAKRAS 120 MG PO TABS
960.0000 mg | ORAL_TABLET | Freq: Every day | ORAL | 5 refills | Status: DC
Start: 2021-04-28 — End: 2021-05-06
  Filled 2021-04-28: qty 240, fill #0
  Filled 2021-04-29: qty 240, 30d supply, fill #0

## 2021-04-28 NOTE — Telephone Encounter (Signed)
Oral Oncology Patient Advocate Encounter   Received notification from [facility] that prior authorization for Lumakras is required.   PA submitted on CoverMyMeds Key [id number] Status is pending   Oral Oncology Clinic will continue to follow.  [place] Patient [place] Phone [phone] Fax [phone] 04/28/2021 8:59 AM

## 2021-04-28 NOTE — Addendum Note (Signed)
Addended by: Burney Gauze R on: 04/28/2021 10:08 AM   Modules accepted: Orders

## 2021-04-28 NOTE — Telephone Encounter (Signed)
Oral Oncology Patient Advocate Encounter  Prior Authorization for [place] has been approved.    PA# [id number] Effective dates: 04/28/21   Patients co-pay is $3447.55  Oral Oncology Clinic will continue to follow.   [place] Patient [place] Phone [phone] Fax [phone] 04/28/2021 10:19 AM

## 2021-04-28 NOTE — Progress Notes (Signed)
See MyChart messaging from 04/27/2021  Oncology Nurse Navigator Documentation  Oncology Nurse Navigator Flowsheets 04/28/2021  Abnormal Finding Date -  Confirmed Diagnosis Date -  Diagnosis Status -  Planned Course of Treatment -  Phase of Treatment -  Chemotherapy Actual Start Date: -  Chemotherapy Actual End Date: -  Surgery Actual Start Date: -  Navigator Follow Up Date: 05/05/2021  Navigator Follow Up Reason: Scan Review  Navigator Location [facility]  Referral Date to RadOnc/MedOnc -  Navigator Encounter Type MyChart  Telephone -  Treatment Initiated Date -  Patient Visit Type MedOnc  Treatment Phase Active Tx  Barriers/Navigation Needs Coordination of Care;Education  Education Other;Pain/ Symptom Management  Interventions Education;Medication Assistance  Acuity Level 2-Minimal Needs (1-2 Barriers Identified)  Coordination of Care -  Education Method Written  Support Groups/Services Friends and Family  Time Spent with Patient 15

## 2021-04-28 NOTE — Telephone Encounter (Signed)
Oral Oncology Pharmacist Encounter  Received new prescription for Lumakras (sotorasib) for the treatment of metastatic non-small cell lung cancer, KRAS G12C-mutated, planned duration until disease progression or unacceptable drug toxicity.  Prescription dose and frequency assessed for appropriateness. Appropriate for therapy initiation.   CMP from 04/08/21 and CBC w/ Diff from 03/19/21 assessed, no relevant lab abnormalities noted.   Current medication list in Epic reviewed, DDIs with Lumakras identified: Category C DDI between [place] and Sertraline: Lumakras, a CYP3A4 inducer, may decrease serum concentrations of sertraline. Recommend monitoring for decreased efficacy of sertraline after initiating Lumakras.   Evaluated chart and no patient barriers to medication adherence noted.   Patient agreement for treatment documented in MD note on 04/25/21.  Prescription has been e-scribed to the [facility] for benefits analysis and approval.  Oral Oncology Clinic will continue to follow for insurance authorization, copayment issues, initial counseling and start date.  Leron Croak, PharmD, BCPS Hematology/Oncology Clinical Pharmacist [facility] [place] [phone] 04/28/2021 8:34 AM

## 2021-04-28 NOTE — Progress Notes (Signed)
Patient is here to discuss the PET results. Patient will need MRI of his brain and spine. These have been scheduled for 05/03/21. Plan to start oral chemo. Prescription has been sent to the specialty pharmacy at [facility].   Oncology Nurse Navigator Documentation  Oncology Nurse Navigator Flowsheets 04/25/2021  Abnormal Finding Date -  Confirmed Diagnosis Date -  Diagnosis Status -  Planned Course of Treatment -  Phase of Treatment -  Chemotherapy Actual Start Date: -  Chemotherapy Actual End Date: -  Surgery Actual Start Date: -  Navigator Follow Up Date: 05/05/2021  Navigator Follow Up Reason: Scan Review  Navigator Location [facility]  Referral Date to RadOnc/MedOnc -  Navigator Encounter Type Follow-up Appt  Telephone -  Treatment Initiated Date -  Patient Visit Type MedOnc  Treatment Phase Active Tx  Barriers/Navigation Needs Coordination of Care;Education  Education -  Interventions Psycho-Social Support  Acuity Level 2-Minimal Needs (1-2 Barriers Identified)  Coordination of Care -  Education Method -  Support Groups/Services Friends and Family  Time Spent with Patient 30

## 2021-04-29 ENCOUNTER — Other Ambulatory Visit (HOSPITAL_COMMUNITY): Payer: Self-pay

## 2021-04-29 NOTE — Telephone Encounter (Signed)
Oral Chemotherapy Pharmacist Encounter  I spoke with patient for overview of new oral chemotherapy medication: Lumakras (sotorasib) for the treatment of metastatic non-small cell lung cancer, KRAS G12C-mutated, planned duration until disease progression or unacceptable drug toxicity.   Pt is doing well. Counseled patient on administration, dosing, side effects, monitoring, drug-food interactions, safe handling, storage, and disposal.   Patient will take Lumakras 120 mg tablets, 8 tablets (960 mg total) by mouth daily.   Patient knows to avoid proton pump inhibitors, histamine blockers, and grapefruit/grapefruit juice while on Lumakras. Mr. Luckey stated that sometimes he does use alka-seltzer as needed for heartburn/indigestion. Discussed in detail that this will need to be separated from his Lumakras dosing if he does take Alka-seltzer. The Lumakras will need to be given either 4 hours before, or 10 hours after administration of alka-seltzer. Patient voiced understanding.    Start date: 05/01/21   Side effects include but are not limited to: decrease in blood counts, diarrhea, hepatotoxicity, arthralgias/musculoskeletal pains. Also reviewed rare but serious side effect of interstitial lung disease/pneumonitis that have been reported with use of medication.   Patient will pick up anti-diarrheal (Imodium) to have on hand prior to starting therapy. Reviewed with patient use of this medication.    Reviewed with patient importance of keeping a medication schedule and plan for any missed doses.   After discussion with patient no patient barriers to medication adherence identified.    Insurance authorization for [facility] has been obtained. Test claim at the pharmacy revealed copayment $3447.55 for 1st fill of Lumakras. Unfortunately patient does not qualify for manufacturer assistance due exceeding income requirements for [employer]'s program. Patient OK with copayment.  Mr. Keown will pick this up  the [facility] on 04/30/21.  Patient informed the pharmacy will reach out 5-7 days prior to needing next fill of Lumakras to coordinate continued medication acquisition to prevent break in therapy.  All questions answered.  Mr. Elms voiced understanding and appreciation.   Medication education handout placed in mail for patient. Patient knows to call the office with questions or concerns. Oral Chemotherapy Clinic phone number provided to patient.  Leron Croak, PharmD, BCPS Hematology/Oncology Clinical Pharmacist [facility] [phone] 04/29/2021 10:09 AM

## 2021-04-29 NOTE — Telephone Encounter (Signed)
Oral Oncology Patient Advocate Encounter  I spoke with Mr. Frady this morning to set up his first fill of [place].  Lumakras will be filled through [facility] today, 04/29/21, and patient will pick up on 04/30/21.    Center will call 7-10 days before next refill is due to complete adherence call and set up delivery of medication.     [place] [facility] [place] Phone [phone] Fax [phone] 04/29/2021 11:08 AM

## 2021-05-01 ENCOUNTER — Inpatient Hospital Stay: Payer: Medicare Other

## 2021-05-01 ENCOUNTER — Encounter: Payer: Self-pay | Admitting: Hematology & Oncology

## 2021-05-01 ENCOUNTER — Encounter (HOSPITAL_COMMUNITY): Payer: Self-pay | Admitting: Cardiology

## 2021-05-01 ENCOUNTER — Inpatient Hospital Stay: Payer: Medicare Other | Admitting: Hematology & Oncology

## 2021-05-01 NOTE — Telephone Encounter (Signed)
Shared Decision Making/Informed Consent The risks [chest pain, shortness of breath, cardiac arrhythmias, dizziness, blood pressure fluctuations, myocardial infarction, stroke/transient ischemic attack, nausea, vomiting, allergic reaction, radiation exposure, metallic taste sensation and life-threatening complications (estimated to be 1 in 10,000)], benefits (risk stratification, diagnosing coronary artery disease, treatment guidance) and alternatives of a nuclear stress test were discussed in detail with John Hinton and he agrees to proceed.

## 2021-05-02 ENCOUNTER — Telehealth: Payer: Medicare Other | Admitting: Thoracic Surgery (Cardiothoracic Vascular Surgery)

## 2021-05-03 ENCOUNTER — Ambulatory Visit (HOSPITAL_COMMUNITY)
Admission: RE | Admit: 2021-05-03 | Discharge: 2021-05-03 | Disposition: A | Discharge status: Home / Self Care | Payer: Medicare Other | Source: Ambulatory Visit | Attending: Hematology & Oncology | Admitting: Hematology & Oncology

## 2021-05-03 DIAGNOSIS — R7989 Other specified abnormal findings of blood chemistry: Secondary | ICD-10-CM | POA: Diagnosis not present

## 2021-05-03 DIAGNOSIS — C3492 Malignant neoplasm of unspecified part of left bronchus or lung: Secondary | ICD-10-CM

## 2021-05-03 DIAGNOSIS — I5042 Chronic combined systolic (congestive) and diastolic (congestive) heart failure: Secondary | ICD-10-CM | POA: Diagnosis not present

## 2021-05-03 DIAGNOSIS — C799 Secondary malignant neoplasm of unspecified site: Secondary | ICD-10-CM | POA: Diagnosis not present

## 2021-05-03 DIAGNOSIS — M47816 Spondylosis without myelopathy or radiculopathy, lumbar region: Secondary | ICD-10-CM | POA: Diagnosis not present

## 2021-05-03 DIAGNOSIS — M439 Deforming dorsopathy, unspecified: Secondary | ICD-10-CM | POA: Diagnosis not present

## 2021-05-03 DIAGNOSIS — J90 Pleural effusion, not elsewhere classified: Secondary | ICD-10-CM | POA: Diagnosis not present

## 2021-05-03 DIAGNOSIS — C7971 Secondary malignant neoplasm of right adrenal gland: Secondary | ICD-10-CM | POA: Diagnosis not present

## 2021-05-03 DIAGNOSIS — C7951 Secondary malignant neoplasm of bone: Secondary | ICD-10-CM | POA: Diagnosis not present

## 2021-05-03 DIAGNOSIS — J9811 Atelectasis: Secondary | ICD-10-CM | POA: Diagnosis not present

## 2021-05-03 DIAGNOSIS — R911 Solitary pulmonary nodule: Secondary | ICD-10-CM | POA: Diagnosis not present

## 2021-05-03 DIAGNOSIS — C3490 Malignant neoplasm of unspecified part of unspecified bronchus or lung: Secondary | ICD-10-CM | POA: Diagnosis not present

## 2021-05-03 DIAGNOSIS — G9341 Metabolic encephalopathy: Secondary | ICD-10-CM | POA: Diagnosis not present

## 2021-05-03 DIAGNOSIS — R079 Chest pain, unspecified: Secondary | ICD-10-CM | POA: Diagnosis not present

## 2021-05-03 DIAGNOSIS — R4182 Altered mental status, unspecified: Secondary | ICD-10-CM | POA: Diagnosis not present

## 2021-05-03 DIAGNOSIS — M5126 Other intervertebral disc displacement, lumbar region: Secondary | ICD-10-CM | POA: Diagnosis not present

## 2021-05-03 DIAGNOSIS — N281 Cyst of kidney, acquired: Secondary | ICD-10-CM | POA: Diagnosis not present

## 2021-05-03 DIAGNOSIS — R0781 Pleurodynia: Secondary | ICD-10-CM | POA: Diagnosis not present

## 2021-05-03 DIAGNOSIS — I2694 Multiple subsegmental pulmonary emboli without acute cor pulmonale: Secondary | ICD-10-CM | POA: Diagnosis not present

## 2021-05-03 DIAGNOSIS — J341 Cyst and mucocele of nose and nasal sinus: Secondary | ICD-10-CM | POA: Diagnosis not present

## 2021-05-03 DIAGNOSIS — Z20822 Contact with and (suspected) exposure to covid-19: Secondary | ICD-10-CM | POA: Diagnosis not present

## 2021-05-03 DIAGNOSIS — J8410 Pulmonary fibrosis, unspecified: Secondary | ICD-10-CM | POA: Diagnosis not present

## 2021-05-03 IMAGING — MR MR LUMBAR SPINE WO/W CM
6 of 9 series · 25 of 48 positions shown · IV contrast (gadavist)
Comparison: No prior MRI, correlation is made with PET scan
[DATE] and [DATE] and [DATE] CT chest

CLINICAL DATA: Non-small cell lung cancer, metastatic

EXAM:
MRI THORACIC AND LUMBAR SPINE WITHOUT AND WITH CONTRAST
TECHNIQUE: Multiplanar and multiecho pulse sequences of the thoracic and lumbar
spine were obtained without and with intravenous contrast.
CONTRAST:  9mL GADAVIST GADOBUTROL 1 MMOL/ML IV SOLN

[Series 5: T2 · sagittal · 4.0mm · 0.81mm/px · 5 of 19 slices shown (1 of 3)]
[im 1/19]
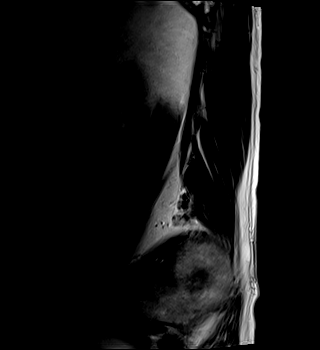
[im 5/19]
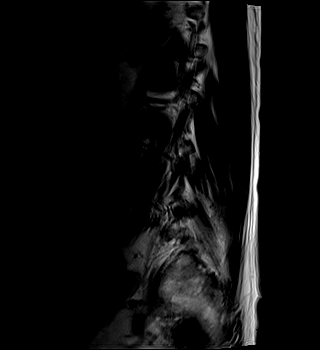
[im 10/19]
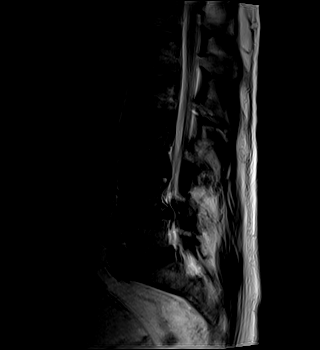
[im 14/19]
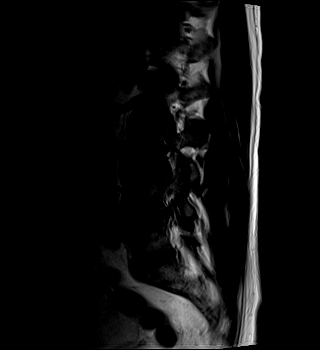
[im 19/19]
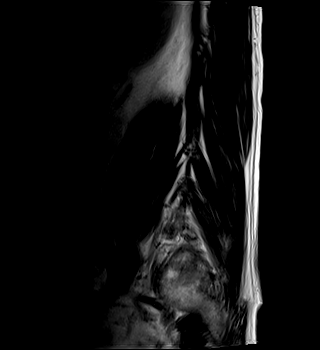

[Series 6: T1 · sagittal · 4.0mm · 0.81mm/px · 3 of 17 slices shown (1 of 2)]
[im 1/17]
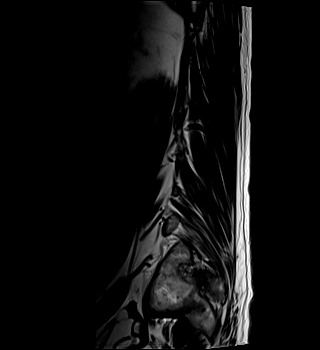
[im 9/17]
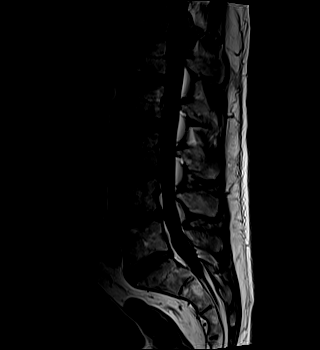
[im 17/17]
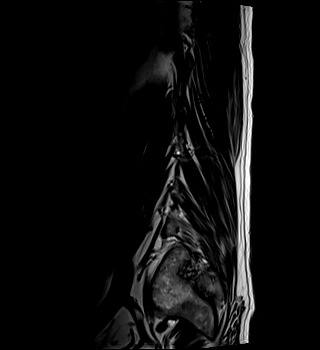

[Series 7: T2 · sagittal · 4.0mm · 0.81mm/px · 3 of 17 slices shown (2 of 3)]
[im 1/17]
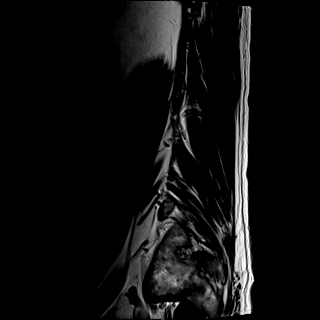
[im 9/17]
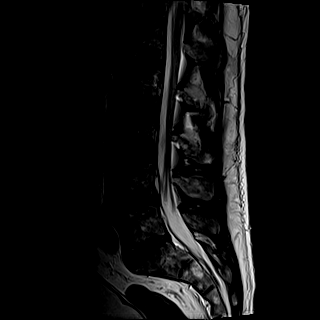
[im 17/17]
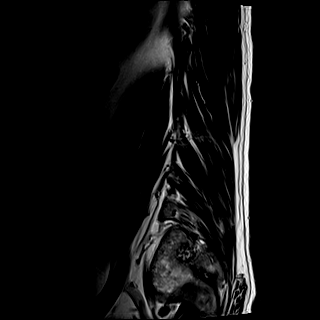

[Series 9: T2 · axial · 4.0mm · 0.39mm/px · z∈[-553,-288]mm · 8 of 55 slices shown (3 of 3)]
[im 1/55]
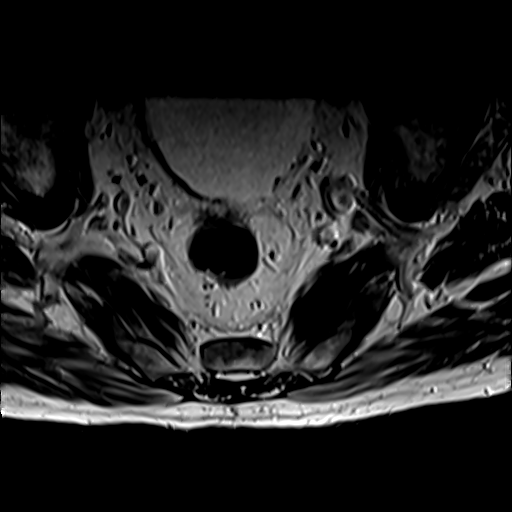
[im 7/55]
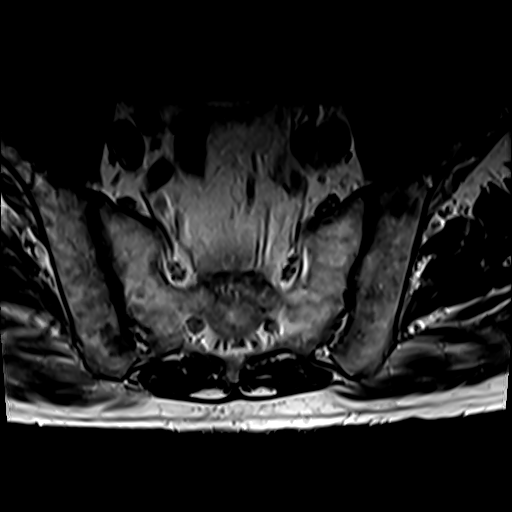
[im 19/55]
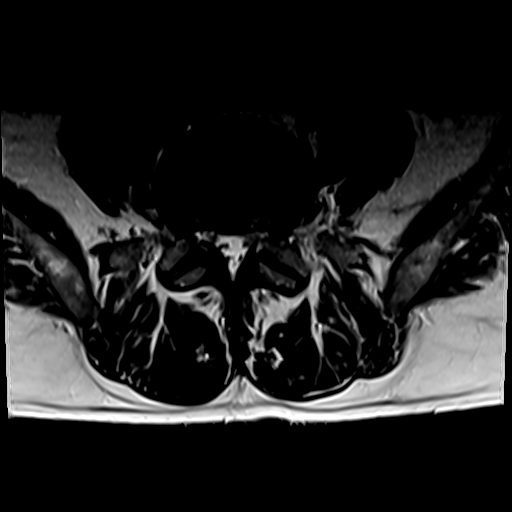
[im 25/55]
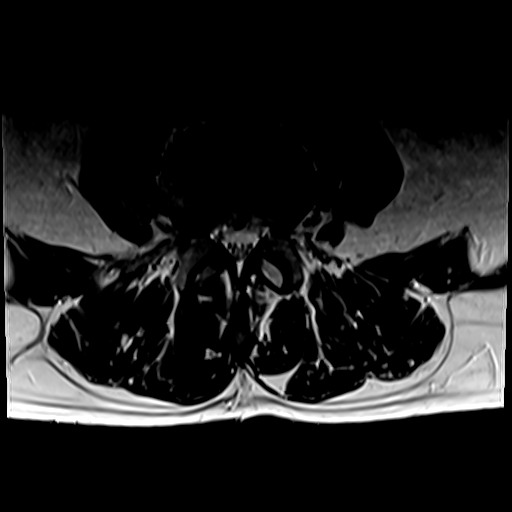
[im 31/55]
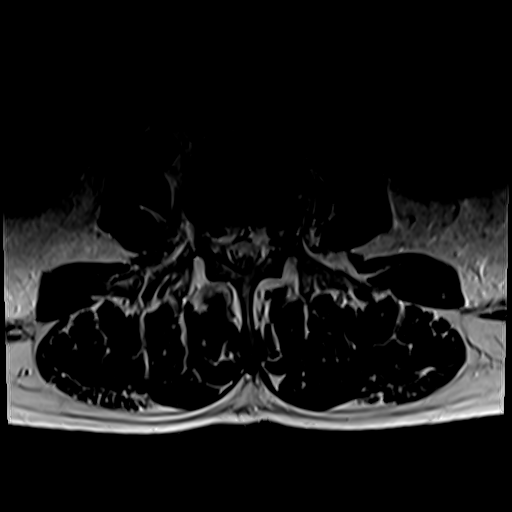
[im 37/55]
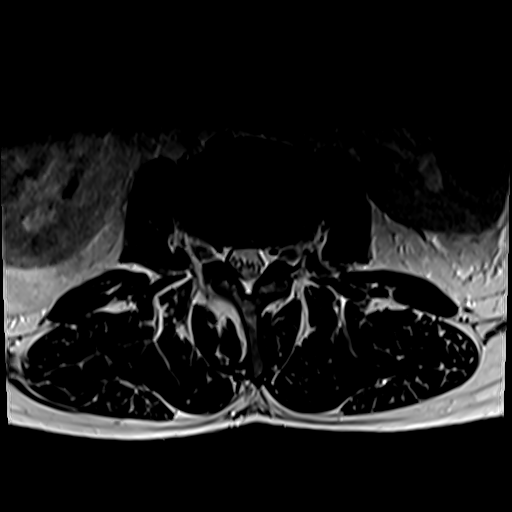
[im 49/55]
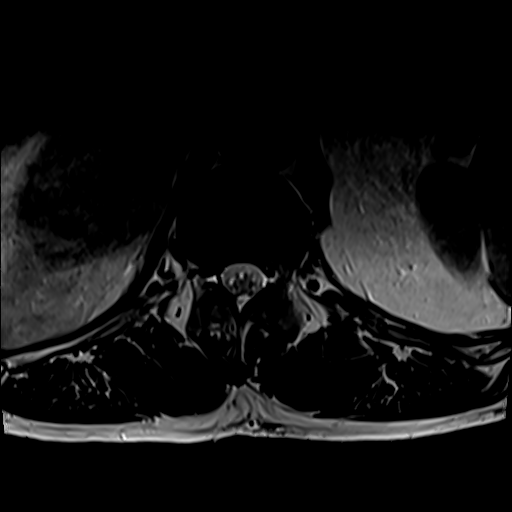
[im 55/55]
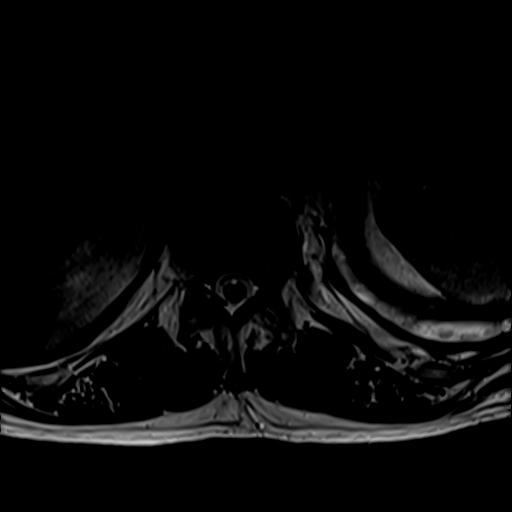

[Series 10: T1 · axial · 4.0mm · 0.39mm/px · z∈[-538,-478]mm · 3 of 43 slices shown (2 of 2)]
[im 1/43]
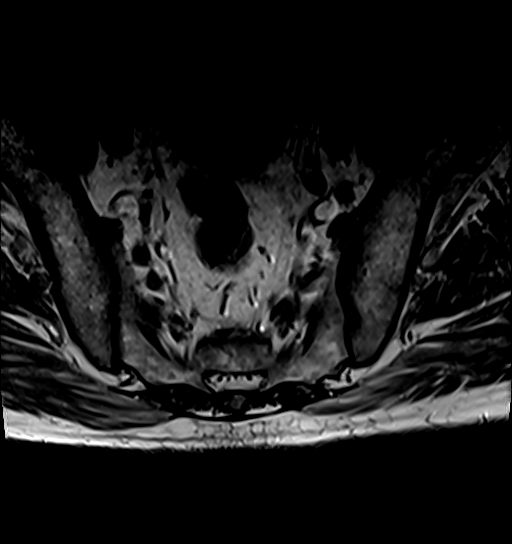
[im 7/43]
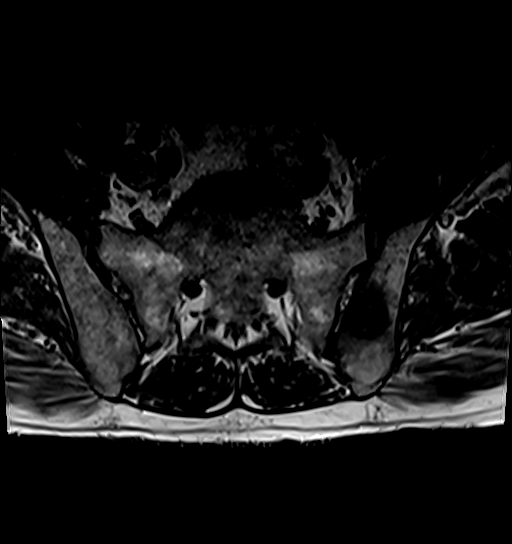
[im 13/43]
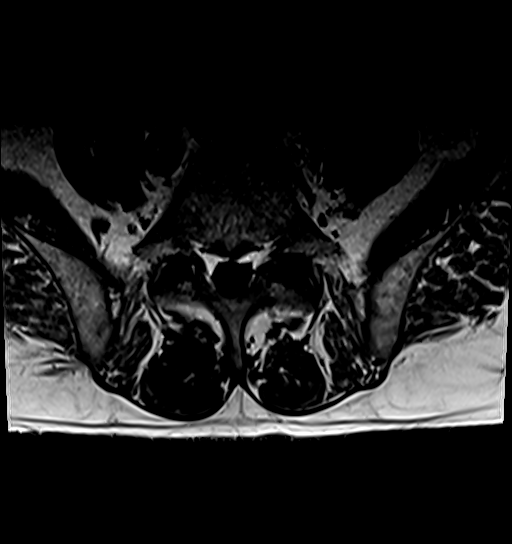

[Series 11: T2 post-contrast · sagittal · 4.0mm · 0.81mm/px · 3 of 17 slices shown]
[im 1/17]
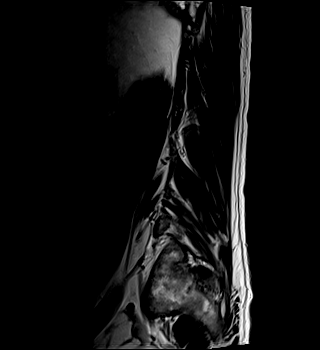
[im 9/17]
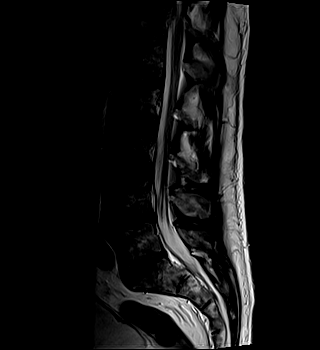
[im 17/17]
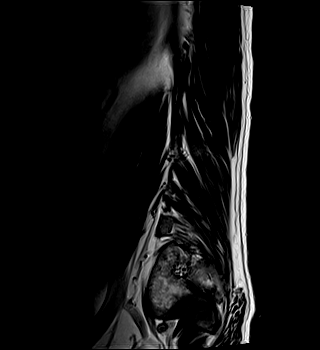

[25 of 48 positions shown; findings below may reference images not displayed]

FINDINGS: MRI THORACIC SPINE FINDINGS

Alignment: S shaped curvature of the thoracolumbar spine. No
significant listhesis.

Vertebrae: Decreased T1 signal, increased T2 signal, and contrast
enhancement in the left aspect of the T1 and right aspect of the T10
vertebral bodies (series 24, image 9). Enhancement extends slightly
into the right pedicle at T10. A focus of contrast is noted in the
left facet at T1 (series 24, image 14). No epidural extension of
tumor no evidence of pathologic fracture. Mild wedging of the T1
vertebral body appears degenerative and unchanged compared to
[DATE]. Additional T1 and T2 hyperintense foci in multiple
vertebral bodies are consistent with benign hemangiomas.

Cord:  Normal signal and morphology.  No abnormal enhancement.

Paraspinal and other soft tissues: Evaluation of the lungs is
limited by motion artifact. The paraspinal tissues are unremarkable.

Disc levels:

No high-grade spinal canal stenosis. Intervertebral discs are
largely preserved. Two

MRI LUMBAR SPINE FINDINGS

Segmentation:  Standard.

Alignment:  Physiologic.

Vertebrae: Enhancing lesion in the left iliac bone, adjacent to the
sacroiliac joint (series 13, image 47), measuring up to 2.2 x 1.2 cm
in the axial plane but not included on the sagittal images given the
field of view this study. Vertebral body heights are preserved. No
other suspicious lesion.

Conus medullaris: Extends to the L1 level and appears normal. No
abnormal enhancement.

Paraspinal and other soft tissues: Evaluation is somewhat limited by
motion artifact. Small renal cysts. No significant pelvic
lymphadenopathy is seen.

Disc levels:

T12-L4: No significant disc bulge. No spinal canal stenosis or
neural foraminal narrowing.

L4-L5: Disc desiccation and broad-based disc bulge. Mild facet
arthropathy. No spinal canal stenosis. Narrowing of the bilateral
lateral recesses. Mild bilateral neural foraminal narrowing, left
greater than right.

L5-S1: No significant disc bulge. No spinal canal stenosis or neural
foraminal narrowing.
IMPRESSION: 1. Abnormal signal and contrast enhancement in the T1 and T10
vertebral bodies, concerning for metastatic disease. No evidence of
pathologic fracture or epidural extension of tumor.
2. Abnormal signal and contrast enhancement in the left iliac bone,
adjacent the sacroiliac joint, also concerning for metastatic
disease, which measures up to 2.2 cm in the axial plane but is not
included on the sagittal images given the focus of this study. If
further evaluation is clinically warranted, consider an MRI of the
sacrum.
3. No spinal canal stenosis or high-grade neural foraminal
narrowing.

## 2021-05-03 IMAGING — MR MR HEAD WO/W CM
9 of 12 series · 34 of 48 positions shown · IV contrast (gadavist)
Comparison: [DATE].

CLINICAL DATA: Non-small cell lung cancer, metastatic, assess for
CNS metastasis

EXAM:
MRI HEAD WITHOUT AND WITH CONTRAST
TECHNIQUE: Multiplanar, multiecho pulse sequences of the brain and surrounding
structures were obtained without and with intravenous contrast.
CONTRAST:  9mL GADAVIST GADOBUTROL 1 MMOL/ML IV SOLN

[Series 5: DWI · axial · 3.0mm · 1.36mm/px · z∈[-95,+79]mm · 5 of 119 slices shown (1 of 2)]
[im 1/119]
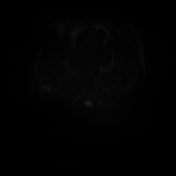
[im 30/119]
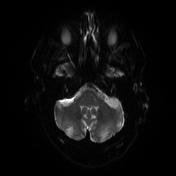
[im 60/119]
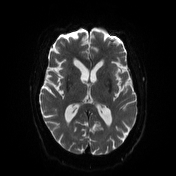
[im 89/119]
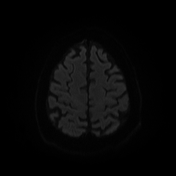
[im 119/119]
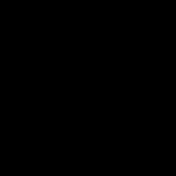

[Series 6: DWI · axial · 3.0mm · 1.36mm/px · z∈[-95,+76]mm · 3 of 59 slices shown (2 of 2)]
[im 1/59]
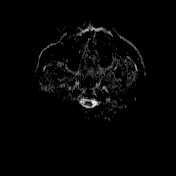
[im 30/59]
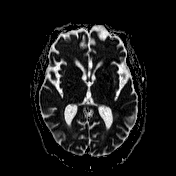
[im 59/59]
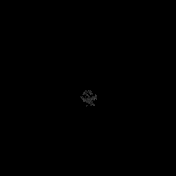

[Series 7: T1 · sagittal · 5.0mm · 0.75mm/px · 2 of 30 slices shown (1 of 2)]
[im 1/30]
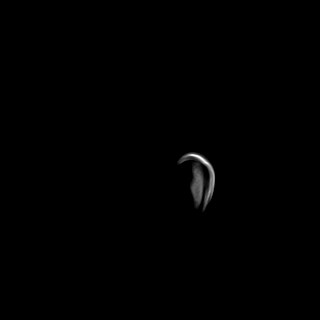
[im 30/30]
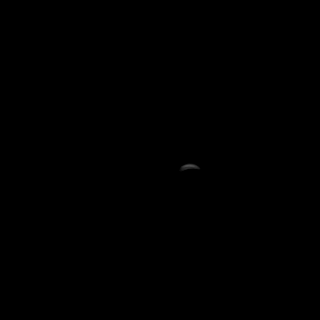

[Series 8: T2 · axial · 5.0mm · 0.65mm/px · z∈[-109,+83]mm · 2 of 31 slices shown]
[im 1/31]
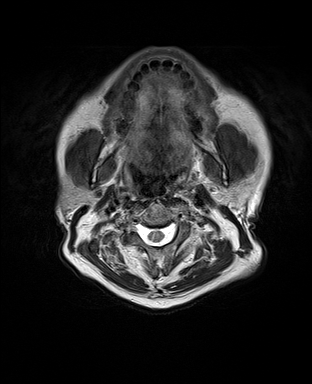
[im 31/31]
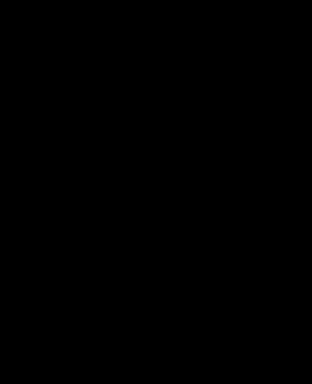

[Series 11: FLAIR · axial · 3.0mm · 0.75mm/px · z∈[-89,+62]mm · 3 of 52 slices shown]
[im 1/52]
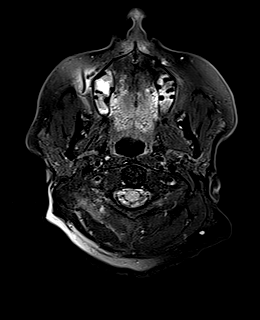
[im 26/52]
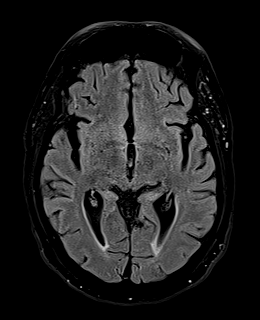
[im 52/52]
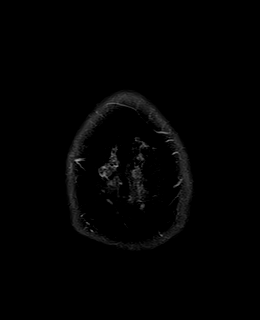

[Series 12: T1 · axial · 1.0mm · 0.49mm/px · z∈[-104,-39]mm · 4 of 176 slices shown (2 of 2)]
[im 1/176]
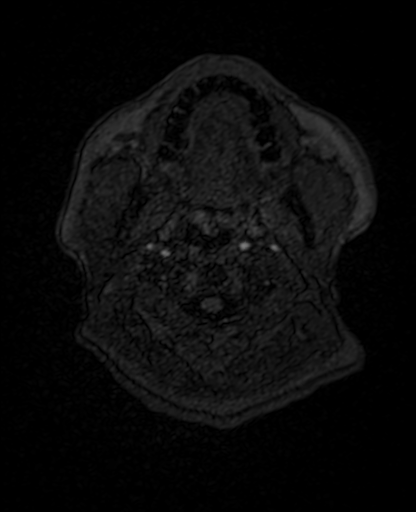
[im 22/176]
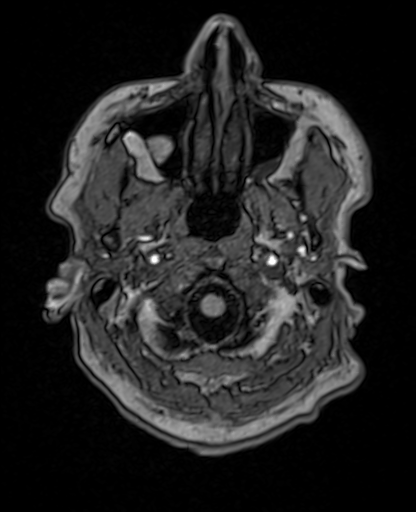
[im 44/176]
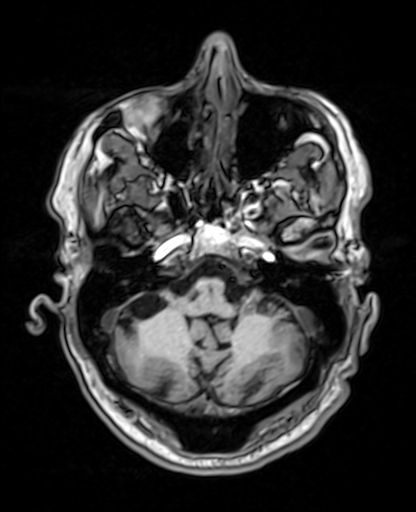
[im 66/176]
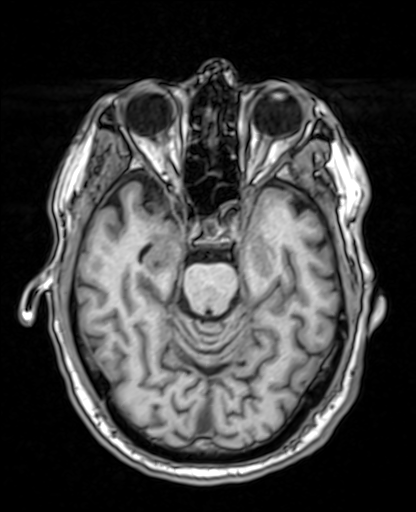

[Series 19: T2 post-contrast · coronal · 5.0mm · 0.57mm/px · 2 of 35 slices shown]
[im 1/35]
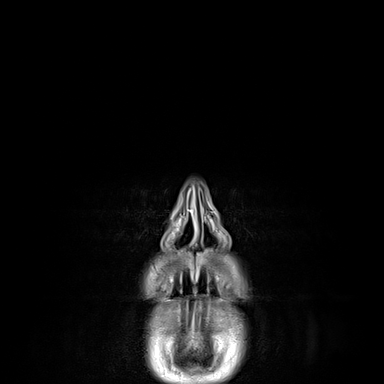
[im 35/35]
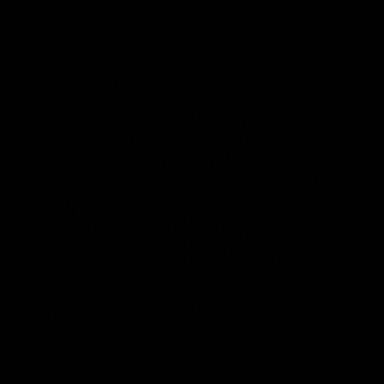

[Series 20: T1 post-contrast · axial · 1.0mm · 0.49mm/px · z∈[-125,+81]mm · 11 of 208 slices shown (1 of 2)]
[im 1/208]
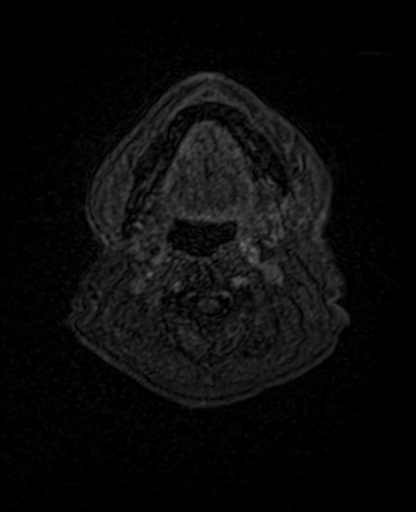
[im 21/208]
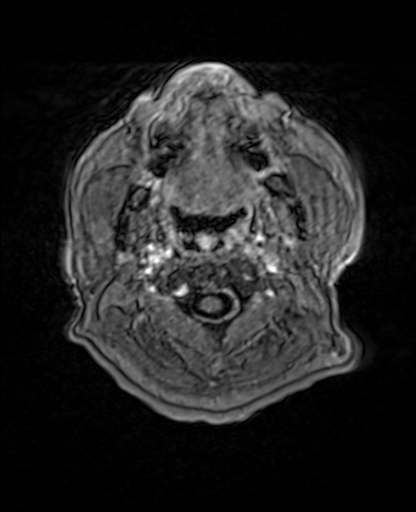
[im 42/208]
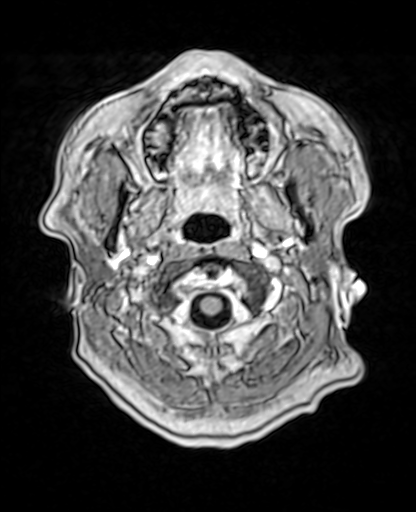
[im 63/208]
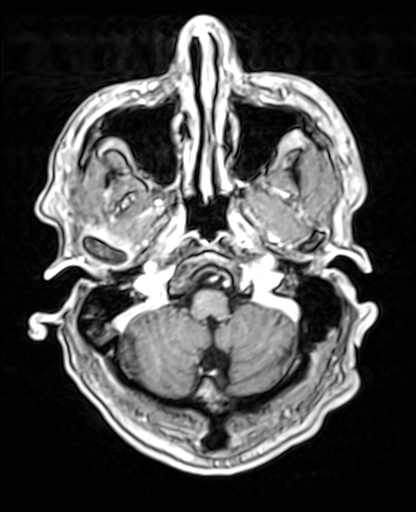
[im 83/208]
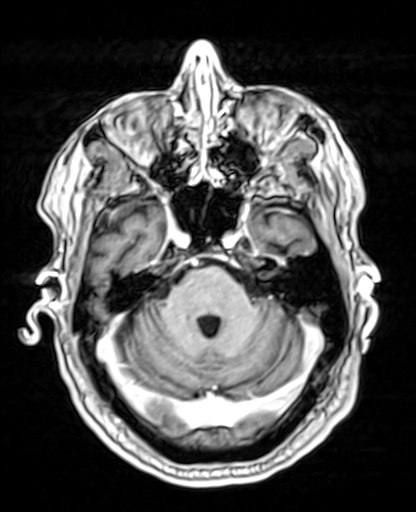
[im 104/208]
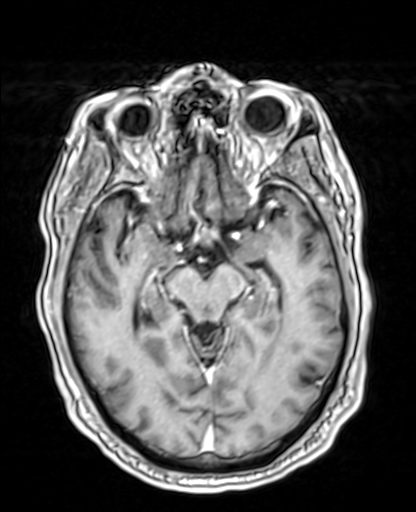
[im 125/208]
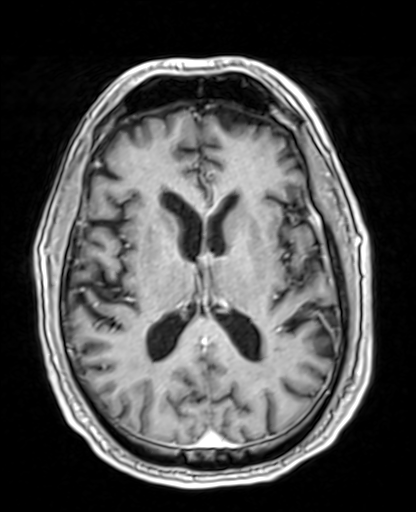
[im 145/208]
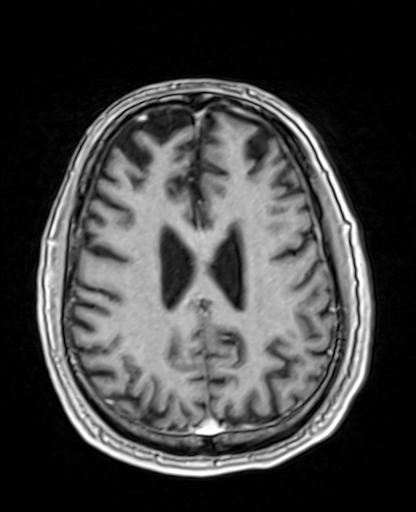
[im 166/208]
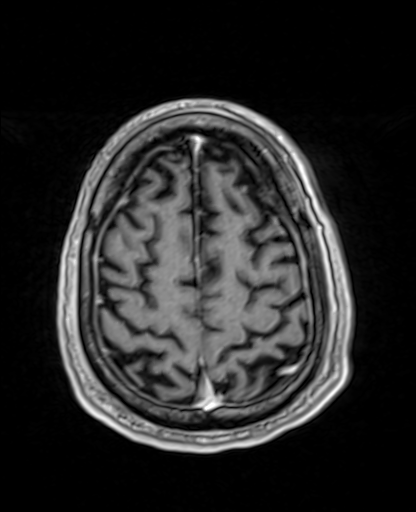
[im 187/208]
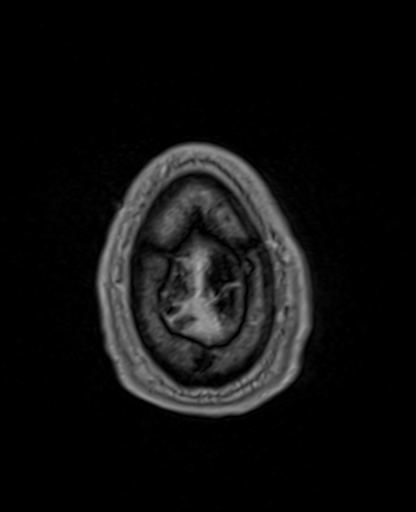
[im 208/208]
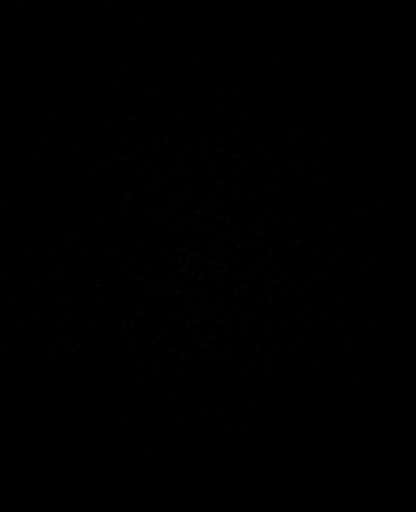

[Series 21: T1 post-contrast · coronal · 5.0mm · 0.43mm/px · 2 of 35 slices shown (2 of 2)]
[im 1/35]
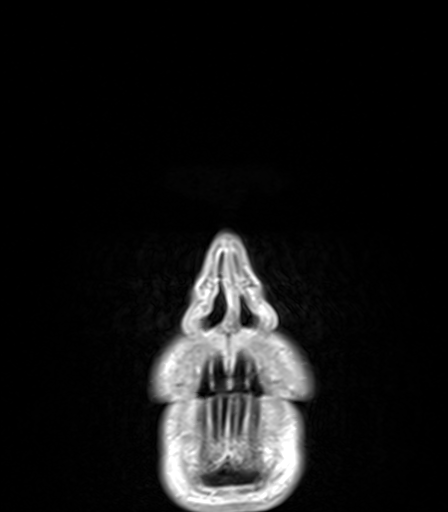
[im 35/35]
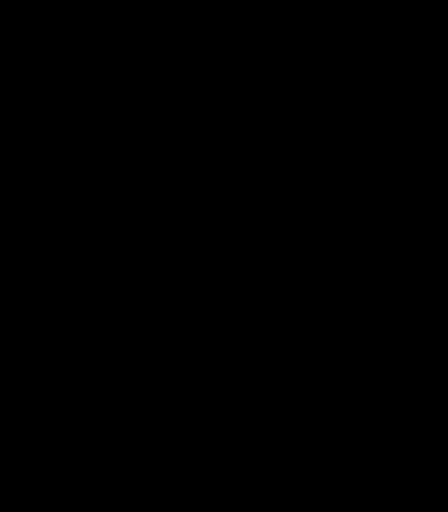

[34 of 48 positions shown; findings below may reference images not displayed]

FINDINGS: Brain: No acute infarction, hemorrhage, hydrocephalus, extra-axial
collection, or mass lesion. The ventricles and sulci are
commensurate with degree of volume loss. No abnormal enhancement.

Vascular: Normal flow voids.

Skull and upper cervical spine: Normal marrow signal. No abnormal
enhancement.

Sinuses/Orbits: Mucous retention cyst mucosal thickening in the
maxillary sinuses and left greater than right ethmoid air cells. The
orbits are unremarkable.

Other: The mastoids are well aerated.
IMPRESSION: No acute intracranial process. No evidence of metastatic disease in
the brain.

## 2021-05-03 MED ORDER — GADOBUTROL 1 MMOL/ML IV SOLN
9.0000 mL | Freq: Once | INTRAVENOUS | Status: AC | PRN
  Administered 2021-05-03: 9 mL via INTRAVENOUS

## 2021-05-04 ENCOUNTER — Inpatient Hospital Stay (HOSPITAL_COMMUNITY)
Admission: EM | Admit: 2021-05-04 | Discharge: 2021-05-06 | DRG: 175 | Disposition: A | Discharge status: Home / Self Care | Payer: Medicare Other | Attending: Internal Medicine | Admitting: Internal Medicine

## 2021-05-04 ENCOUNTER — Other Ambulatory Visit: Payer: Self-pay

## 2021-05-04 ENCOUNTER — Encounter (HOSPITAL_COMMUNITY): Payer: Self-pay | Admitting: Emergency Medicine

## 2021-05-04 ENCOUNTER — Encounter: Payer: Self-pay | Admitting: Hematology & Oncology

## 2021-05-04 ENCOUNTER — Emergency Department (HOSPITAL_COMMUNITY): Payer: Medicare Other

## 2021-05-04 DIAGNOSIS — I255 Ischemic cardiomyopathy: Secondary | ICD-10-CM | POA: Diagnosis present

## 2021-05-04 DIAGNOSIS — E785 Hyperlipidemia, unspecified: Secondary | ICD-10-CM | POA: Diagnosis present

## 2021-05-04 DIAGNOSIS — M8458XA Pathological fracture in neoplastic disease, other specified site, initial encounter for fracture: Secondary | ICD-10-CM | POA: Diagnosis present

## 2021-05-04 DIAGNOSIS — Z955 Presence of coronary angioplasty implant and graft: Secondary | ICD-10-CM

## 2021-05-04 DIAGNOSIS — E871 Hypo-osmolality and hyponatremia: Secondary | ICD-10-CM | POA: Diagnosis present

## 2021-05-04 DIAGNOSIS — I2699 Other pulmonary embolism without acute cor pulmonale: Secondary | ICD-10-CM | POA: Diagnosis present

## 2021-05-04 DIAGNOSIS — N400 Enlarged prostate without lower urinary tract symptoms: Secondary | ICD-10-CM | POA: Diagnosis present

## 2021-05-04 DIAGNOSIS — I5042 Chronic combined systolic (congestive) and diastolic (congestive) heart failure: Secondary | ICD-10-CM | POA: Diagnosis present

## 2021-05-04 DIAGNOSIS — C7951 Secondary malignant neoplasm of bone: Secondary | ICD-10-CM | POA: Diagnosis present

## 2021-05-04 DIAGNOSIS — Z20822 Contact with and (suspected) exposure to covid-19: Secondary | ICD-10-CM | POA: Diagnosis present

## 2021-05-04 DIAGNOSIS — C7971 Secondary malignant neoplasm of right adrenal gland: Secondary | ICD-10-CM | POA: Diagnosis present

## 2021-05-04 DIAGNOSIS — Z79899 Other long term (current) drug therapy: Secondary | ICD-10-CM

## 2021-05-04 DIAGNOSIS — Z888 Allergy status to other drugs, medicaments and biological substances status: Secondary | ICD-10-CM

## 2021-05-04 DIAGNOSIS — Z85118 Personal history of other malignant neoplasm of bronchus and lung: Secondary | ICD-10-CM | POA: Diagnosis not present

## 2021-05-04 DIAGNOSIS — J90 Pleural effusion, not elsewhere classified: Secondary | ICD-10-CM | POA: Diagnosis not present

## 2021-05-04 DIAGNOSIS — I2510 Atherosclerotic heart disease of native coronary artery without angina pectoris: Secondary | ICD-10-CM | POA: Diagnosis present

## 2021-05-04 DIAGNOSIS — I77810 Thoracic aortic ectasia: Secondary | ICD-10-CM | POA: Diagnosis present

## 2021-05-04 DIAGNOSIS — G4733 Obstructive sleep apnea (adult) (pediatric): Secondary | ICD-10-CM | POA: Diagnosis present

## 2021-05-04 DIAGNOSIS — I2694 Multiple subsegmental pulmonary emboli without acute cor pulmonale: Secondary | ICD-10-CM | POA: Diagnosis present

## 2021-05-04 DIAGNOSIS — G9341 Metabolic encephalopathy: Secondary | ICD-10-CM | POA: Diagnosis present

## 2021-05-04 DIAGNOSIS — D649 Anemia, unspecified: Secondary | ICD-10-CM | POA: Diagnosis present

## 2021-05-04 DIAGNOSIS — Z951 Presence of aortocoronary bypass graft: Secondary | ICD-10-CM

## 2021-05-04 DIAGNOSIS — Z902 Acquired absence of lung [part of]: Secondary | ICD-10-CM

## 2021-05-04 DIAGNOSIS — R4182 Altered mental status, unspecified: Secondary | ICD-10-CM | POA: Diagnosis not present

## 2021-05-04 DIAGNOSIS — T451X5A Adverse effect of antineoplastic and immunosuppressive drugs, initial encounter: Secondary | ICD-10-CM | POA: Diagnosis present

## 2021-05-04 DIAGNOSIS — I10 Essential (primary) hypertension: Secondary | ICD-10-CM

## 2021-05-04 DIAGNOSIS — Z7902 Long term (current) use of antithrombotics/antiplatelets: Secondary | ICD-10-CM

## 2021-05-04 DIAGNOSIS — I4510 Unspecified right bundle-branch block: Secondary | ICD-10-CM | POA: Diagnosis present

## 2021-05-04 DIAGNOSIS — C3492 Malignant neoplasm of unspecified part of left bronchus or lung: Secondary | ICD-10-CM | POA: Diagnosis present

## 2021-05-04 DIAGNOSIS — C799 Secondary malignant neoplasm of unspecified site: Secondary | ICD-10-CM

## 2021-05-04 DIAGNOSIS — I493 Ventricular premature depolarization: Secondary | ICD-10-CM | POA: Diagnosis not present

## 2021-05-04 DIAGNOSIS — R7989 Other specified abnormal findings of blood chemistry: Secondary | ICD-10-CM

## 2021-05-04 DIAGNOSIS — I427 Cardiomyopathy due to drug and external agent: Secondary | ICD-10-CM | POA: Diagnosis present

## 2021-05-04 DIAGNOSIS — R911 Solitary pulmonary nodule: Secondary | ICD-10-CM | POA: Diagnosis not present

## 2021-05-04 DIAGNOSIS — I110 Hypertensive heart disease with heart failure: Secondary | ICD-10-CM | POA: Diagnosis present

## 2021-05-04 DIAGNOSIS — R079 Chest pain, unspecified: Secondary | ICD-10-CM | POA: Diagnosis not present

## 2021-05-04 DIAGNOSIS — I420 Dilated cardiomyopathy: Secondary | ICD-10-CM | POA: Diagnosis not present

## 2021-05-04 DIAGNOSIS — Z87891 Personal history of nicotine dependence: Secondary | ICD-10-CM

## 2021-05-04 DIAGNOSIS — Z7982 Long term (current) use of aspirin: Secondary | ICD-10-CM

## 2021-05-04 DIAGNOSIS — J8410 Pulmonary fibrosis, unspecified: Secondary | ICD-10-CM | POA: Diagnosis not present

## 2021-05-04 DIAGNOSIS — Z8249 Family history of ischemic heart disease and other diseases of the circulatory system: Secondary | ICD-10-CM

## 2021-05-04 DIAGNOSIS — N529 Male erectile dysfunction, unspecified: Secondary | ICD-10-CM | POA: Diagnosis present

## 2021-05-04 DIAGNOSIS — R0781 Pleurodynia: Secondary | ICD-10-CM | POA: Diagnosis not present

## 2021-05-04 DIAGNOSIS — J9811 Atelectasis: Secondary | ICD-10-CM | POA: Diagnosis not present

## 2021-05-04 LAB — COMPREHENSIVE METABOLIC PANEL
ALT: 19 U/L (ref 0–44)
AST: 20 U/L (ref 15–41)
Albumin: 3.4 g/dL — ABNORMAL LOW (ref 3.5–5.0)
Alkaline Phosphatase: 80 U/L (ref 38–126)
Anion gap: 11 (ref 5–15)
BUN: 20 mg/dL (ref 8–23)
CO2: 24 mmol/L (ref 22–32)
Calcium: 9.5 mg/dL (ref 8.9–10.3)
Chloride: 100 mmol/L (ref 98–111)
Creatinine, Ser: 0.93 mg/dL (ref 0.61–1.24)
GFR, Estimated: >60 mL/min (ref >60)
Glucose, Bld: 109 mg/dL — ABNORMAL HIGH (ref 70–99)
Potassium: 4.1 mmol/L (ref 3.5–5.1)
Sodium: 135 mmol/L (ref 135–145)
Total Bilirubin: 0.7 mg/dL (ref 0.3–1.2)
Total Protein: 7.8 g/dL (ref 6.5–8.1)

## 2021-05-04 LAB — URINALYSIS, ROUTINE W REFLEX MICROSCOPIC
Bacteria, UA: NONE SEEN
Bilirubin Urine: NEGATIVE
Glucose, UA: NEGATIVE mg/dL
Ketones, ur: 5 mg/dL — AB
Leukocytes,Ua: NEGATIVE
Nitrite: NEGATIVE
Protein, ur: NEGATIVE mg/dL
Specific Gravity, Urine: 1.023 (ref 1.005–1.030)
pH: 5 (ref 5.0–8.0)

## 2021-05-04 LAB — CBC WITH DIFFERENTIAL/PLATELET
Abs Immature Granulocytes: 0.04 10*3/uL (ref 0.00–0.07)
Basophils Absolute: 0 10*3/uL (ref 0.0–0.1)
Basophils Relative: 0 %
Eosinophils Absolute: 0.1 10*3/uL (ref 0.0–0.5)
Eosinophils Relative: 1 %
HCT: 36.6 % — ABNORMAL LOW (ref 39.0–52.0)
Hemoglobin: 11.7 g/dL — ABNORMAL LOW (ref 13.0–17.0)
Immature Granulocytes: 0 %
Lymphocytes Relative: 8 %
Lymphs Abs: 0.7 10*3/uL (ref 0.7–4.0)
MCH: 30.7 pg (ref 26.0–34.0)
MCHC: 32 g/dL (ref 30.0–36.0)
MCV: 96.1 fL (ref 80.0–100.0)
Monocytes Absolute: 0.8 10*3/uL (ref 0.1–1.0)
Monocytes Relative: 8 %
Neutro Abs: 7.4 10*3/uL (ref 1.7–7.7)
Neutrophils Relative %: 83 %
Platelets: 267 10*3/uL (ref 150–400)
RBC: 3.81 MIL/uL — ABNORMAL LOW (ref 4.22–5.81)
RDW: 14.5 % (ref 11.5–15.5)
WBC: 9.1 10*3/uL (ref 4.0–10.5)
nRBC: 0 % (ref 0.0–0.2)

## 2021-05-04 LAB — I-STAT ARTERIAL BLOOD GAS, ED
Acid-base deficit: 2 mmol/L (ref 0.0–2.0)
Bicarbonate: 20.4 mmol/L (ref 20.0–28.0)
Calcium, Ion: 1.18 mmol/L (ref 1.15–1.40)
HCT: 35 % — ABNORMAL LOW (ref 39.0–52.0)
Hemoglobin: 11.9 g/dL — ABNORMAL LOW (ref 13.0–17.0)
O2 Saturation: 95 %
Potassium: 4.1 mmol/L (ref 3.5–5.1)
Sodium: 134 mmol/L — ABNORMAL LOW (ref 135–145)
TCO2: 21 mmol/L — ABNORMAL LOW (ref 22–32)
pCO2 arterial: 27.5 mmHg — ABNORMAL LOW (ref 32.0–48.0)
pH, Arterial: 7.478 — ABNORMAL HIGH (ref 7.350–7.450)
pO2, Arterial: 69 mmHg — ABNORMAL LOW (ref 83.0–108.0)

## 2021-05-04 LAB — LACTIC ACID, PLASMA
Lactic Acid, Venous: 0.9 mmol/L (ref 0.5–1.9)
Lactic Acid, Venous: 1.8 mmol/L (ref 0.5–1.9)

## 2021-05-04 LAB — AMMONIA: Ammonia: <10 umol/L (ref 9–35)

## 2021-05-04 LAB — RESP PANEL BY RT-PCR (FLU A&B, COVID) ARPGX2
Influenza A by PCR: NEGATIVE
Influenza B by PCR: NEGATIVE
SARS Coronavirus 2 by RT PCR: NEGATIVE

## 2021-05-04 LAB — TROPONIN I (HIGH SENSITIVITY)
Troponin I (High Sensitivity): 13 ng/L (ref <18)
Troponin I (High Sensitivity): 14 ng/L (ref <18)

## 2021-05-04 LAB — BRAIN NATRIURETIC PEPTIDE: B Natriuretic Peptide: 143.7 pg/mL — ABNORMAL HIGH (ref 0.0–100.0)

## 2021-05-04 IMAGING — CT CT HEAD W/O CM
4 series · 17 of 47 positions shown, 19 images · non-contrast
Comparison: None.

CLINICAL DATA: Altered mental status

EXAM:
CT HEAD WITHOUT CONTRAST
TECHNIQUE: Contiguous axial images were obtained from the base of the skull
through the vertex without intravenous contrast.

[Series 3: head wo · axial · 0.44mm/px · z∈[-62,+53]mm · 7 of 31 slices shown, 9 images]
[im 4/31  brain]
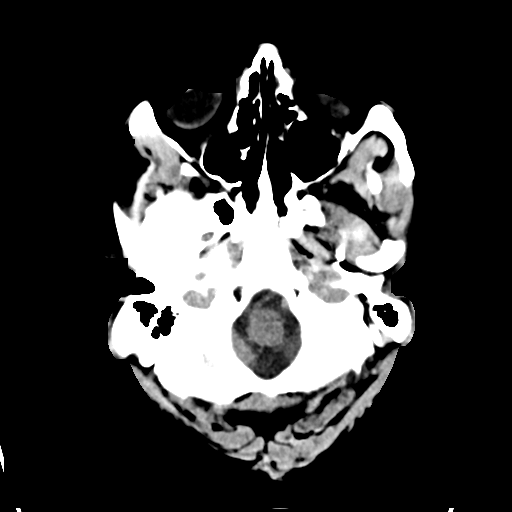
[im 4/31  bone]
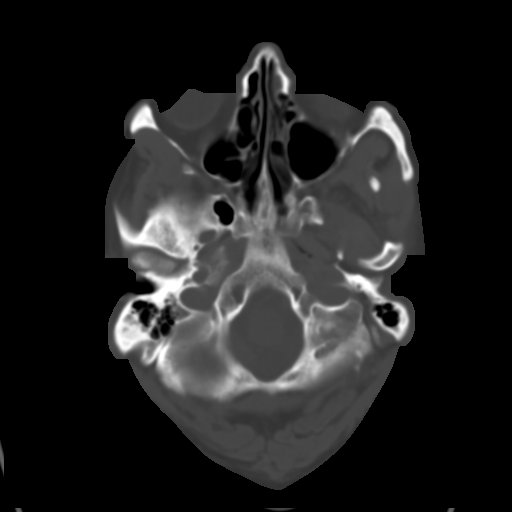
[im 8/31  brain]
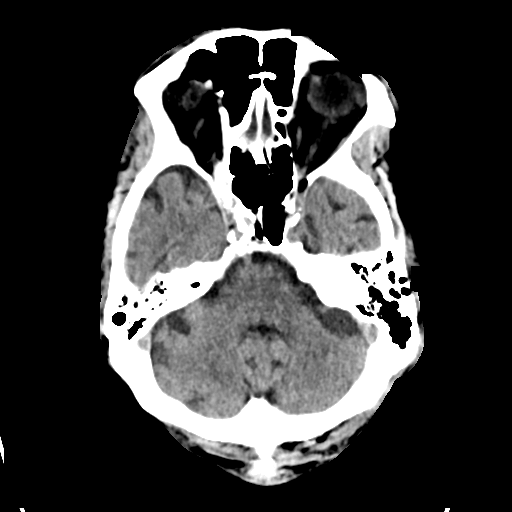
[im 12/31  brain]
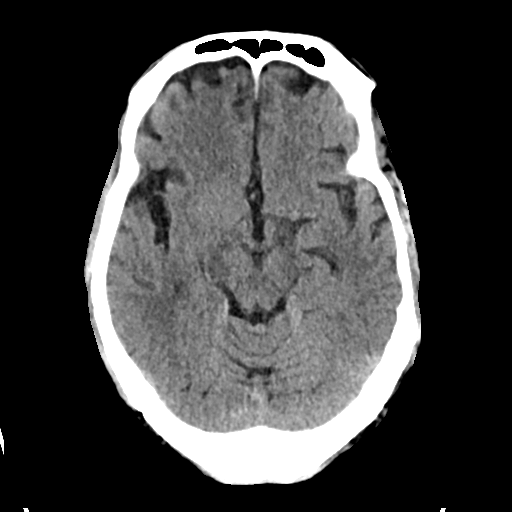
[im 16/31  brain]
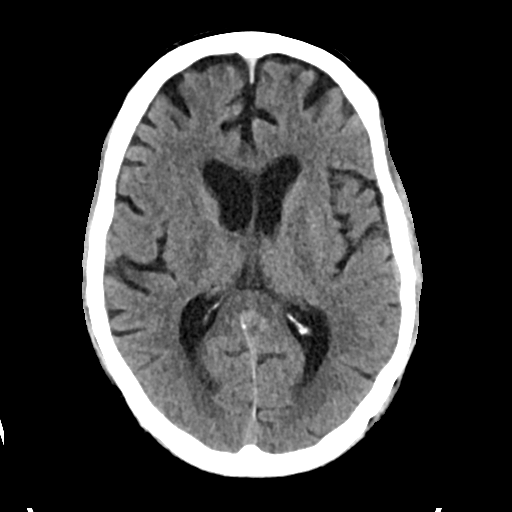
[im 19/31  brain]
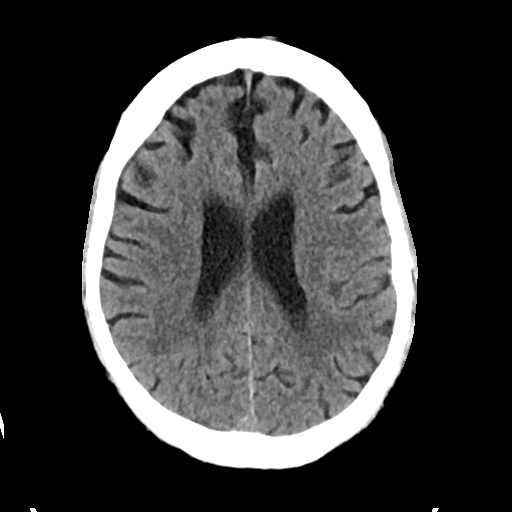
[im 19/31  bone]
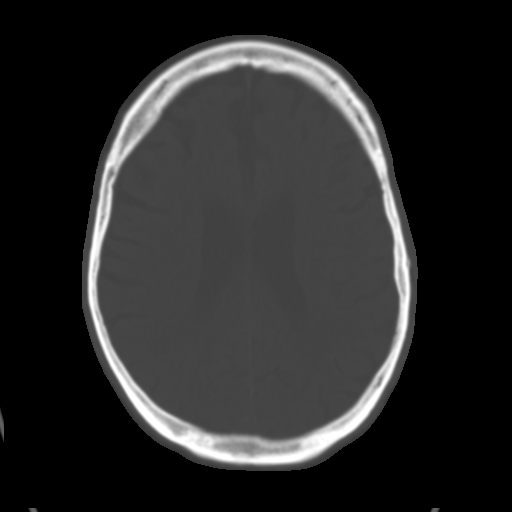
[im 23/31  brain]
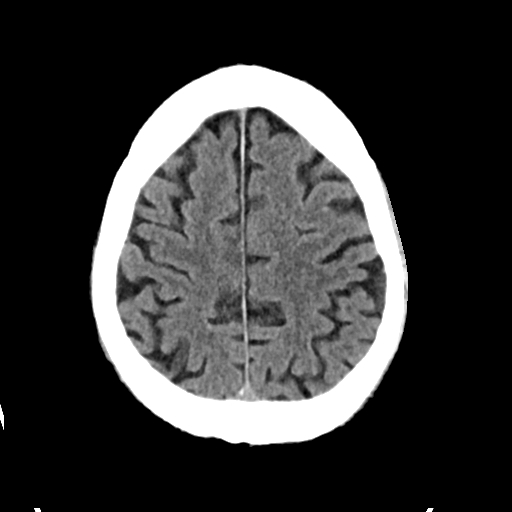
[im 27/31  brain]
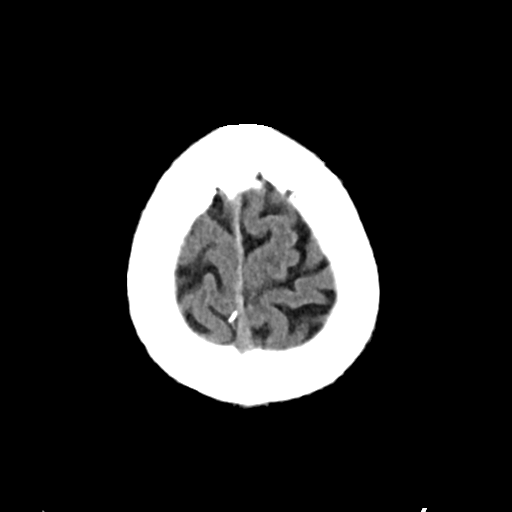

[Series 4: head bone · axial · 0.44mm/px · z∈[-63,-9]mm · 4 of 77 slices shown]
[im 8/77  bone]
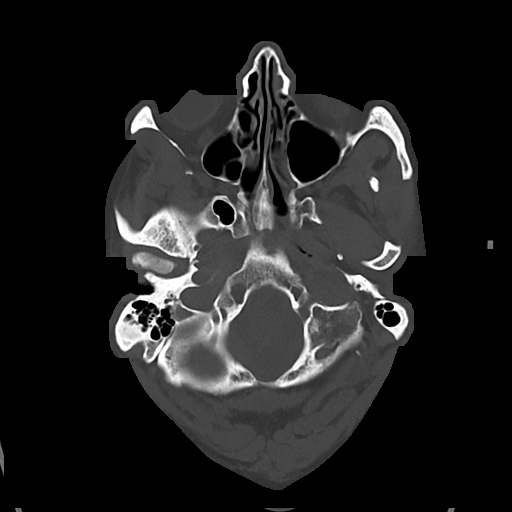
[im 16/77  bone]
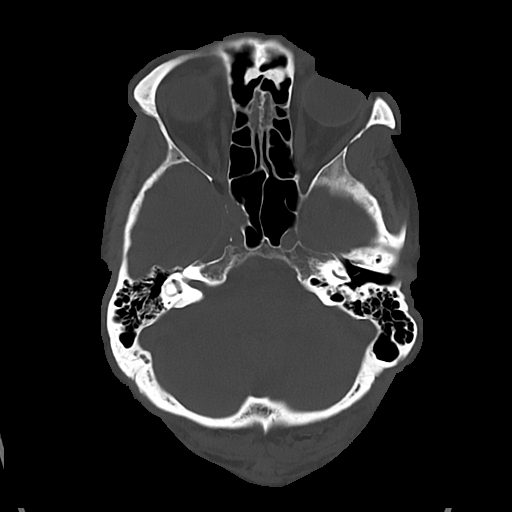
[im 23/77  bone]
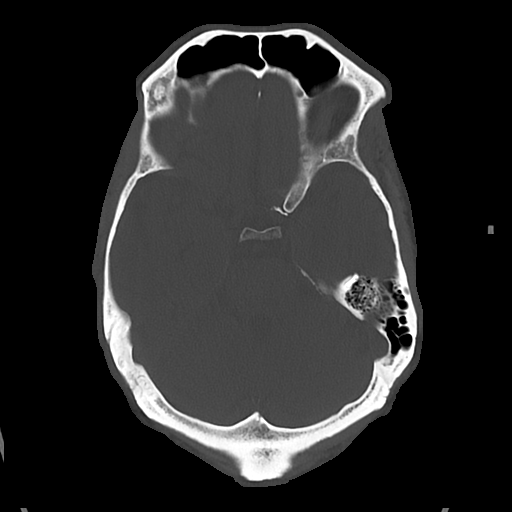
[im 35/77  bone]
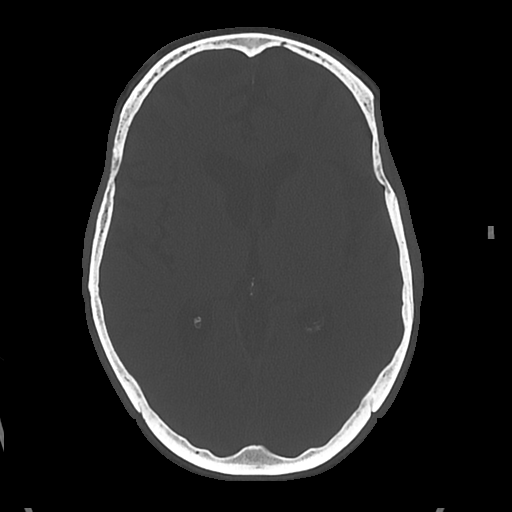

[Series 5: cor soft · coronal · 0.31mm/px · 3 of 73 slices shown]
[im 25/73  brain]
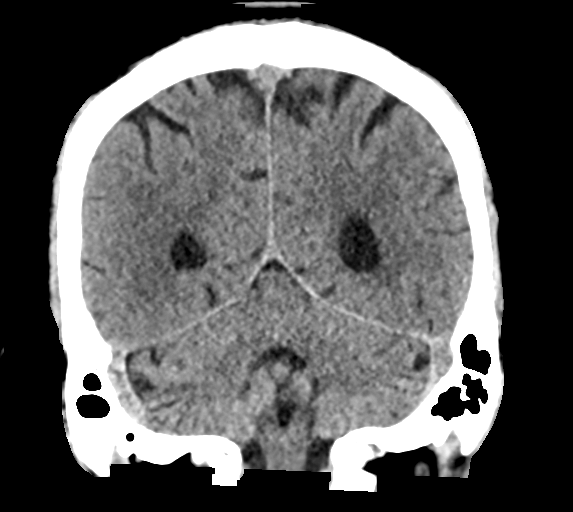
[im 33/73  brain]
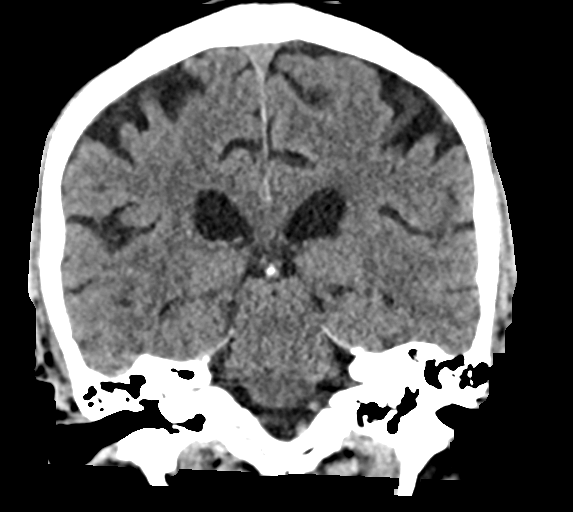
[im 41/73  brain]
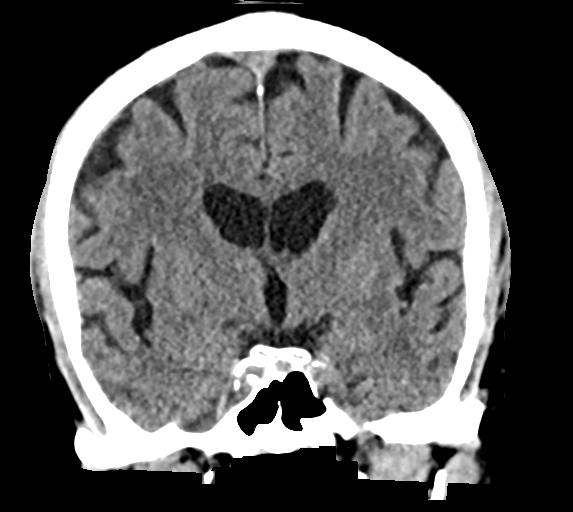

[Series 6: sag soft · sagittal · 0.33mm/px · 3 of 57 slices shown]
[im 19/57  brain]
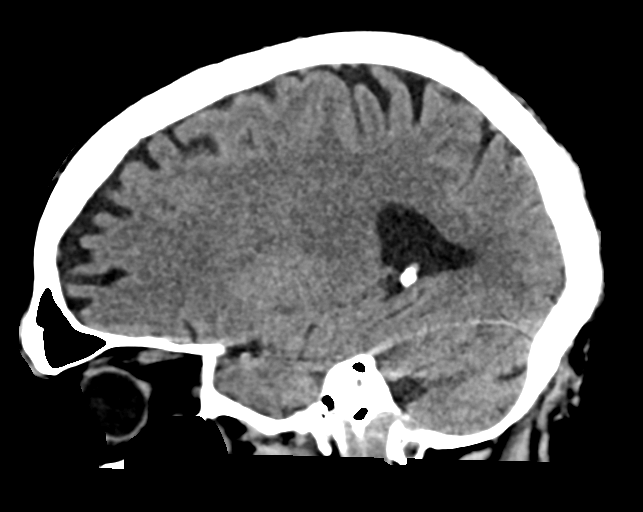
[im 29/57  brain]
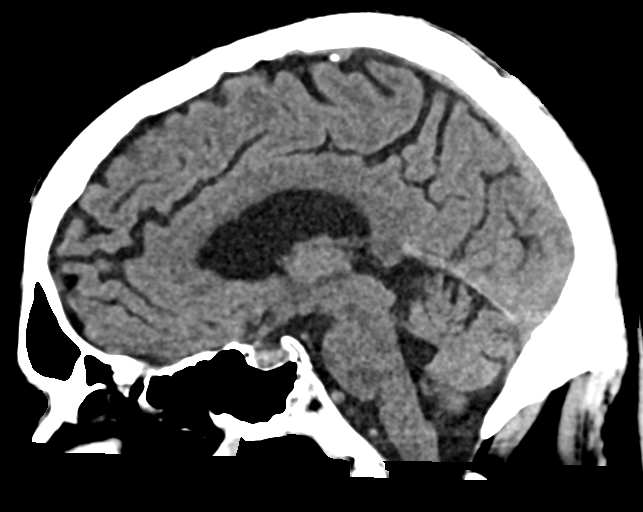
[im 38/57  brain]
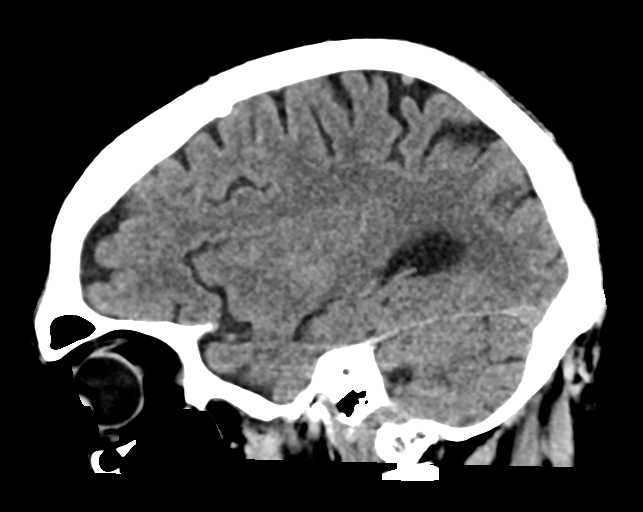

[17 of 47 positions shown; findings below may reference images not displayed]

FINDINGS: Brain: Mild age related volume loss. No acute intracranial
abnormality. Specifically, no hemorrhage, hydrocephalus, mass
lesion, acute infarction, or significant intracranial injury.

Vascular: No hyperdense vessel or unexpected calcification.

Skull: No acute calvarial abnormality.

Sinuses/Orbits: No acute findings

Other: None
IMPRESSION: No acute intracranial abnormality.

## 2021-05-04 IMAGING — CT CT ANGIO CHEST
2 of 7 series · 16 of 46 positions shown · IV contrast (APPLIED)
Comparison: [DATE] PET-CT

CLINICAL DATA: History of lung carcinoma with chest pain, initial
encounter

EXAM:
CT ANGIOGRAPHY CHEST WITH CONTRAST
TECHNIQUE: Multidetector CT imaging of the chest was performed using the
standard protocol during bolus administration of intravenous
contrast. Multiplanar CT image reconstructions and MIPs were
obtained to evaluate the vascular anatomy.
CONTRAST:  50mL OMNIPAQUE IOHEXOL 350 MG/ML SOLN

[Series 7: thins · axial · 0.75mm/px · z∈[-442,-195]mm · 13 of 396 slices shown]
[im 22/396  lung]
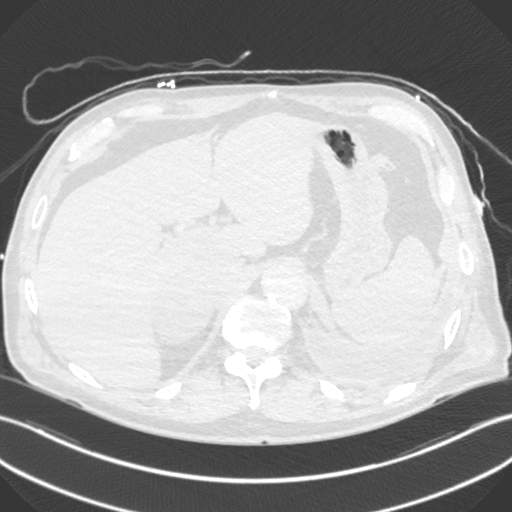
[im 44/396  soft-tissue]
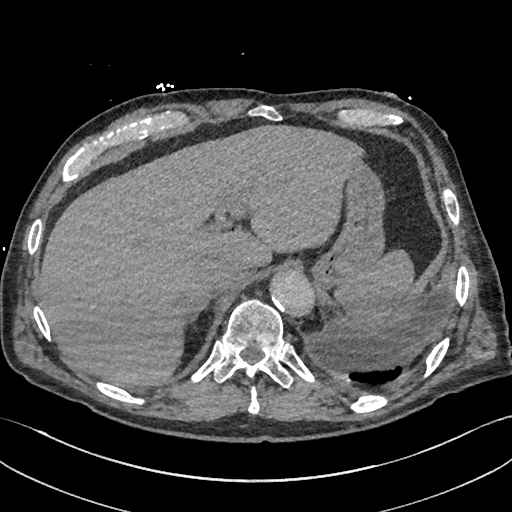
[im 88/396  lung]
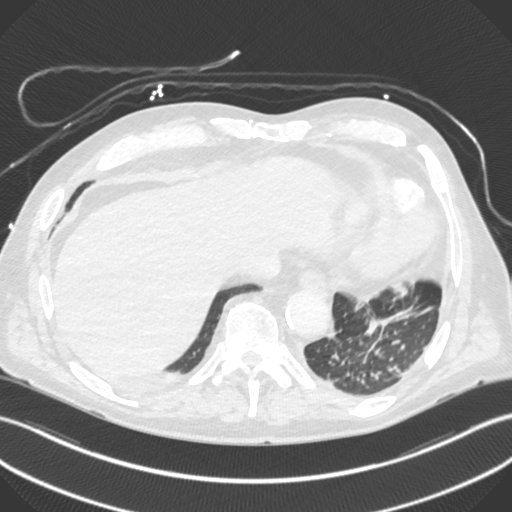
[im 110/396  soft-tissue]
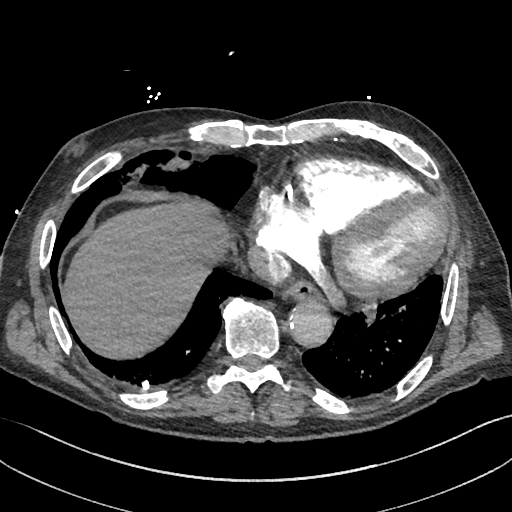
[im 132/396  lung]
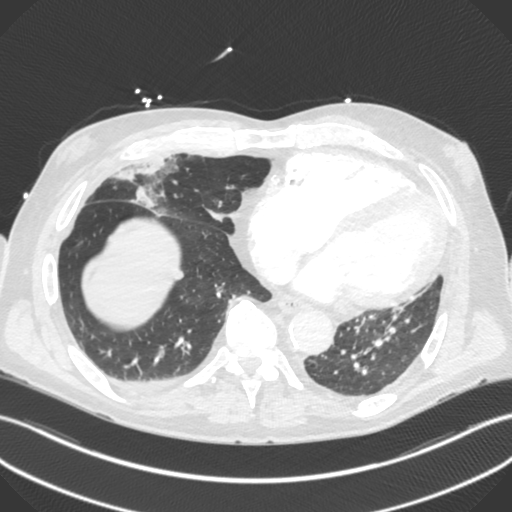
[im 176/396  soft-tissue]
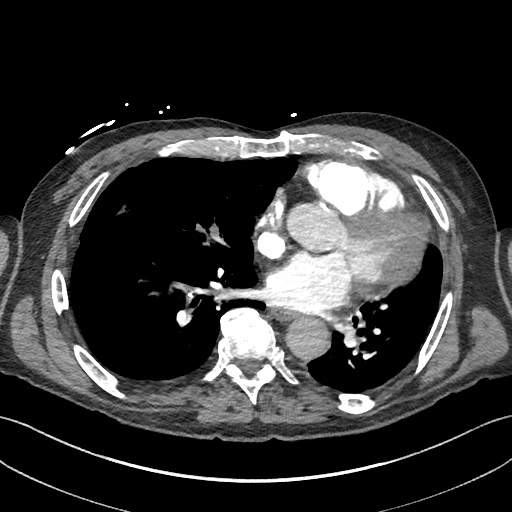
[im 198/396  lung]
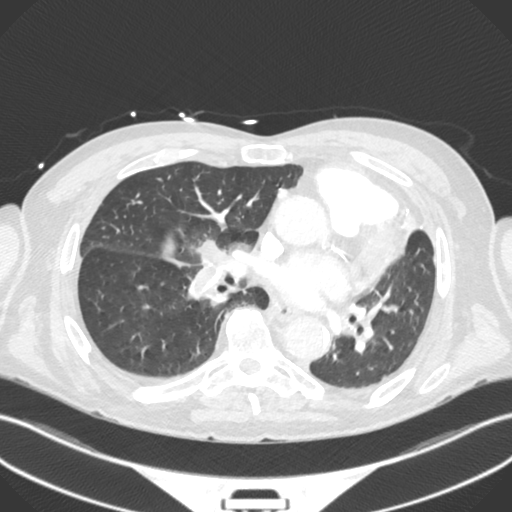
[im 220/396  soft-tissue]
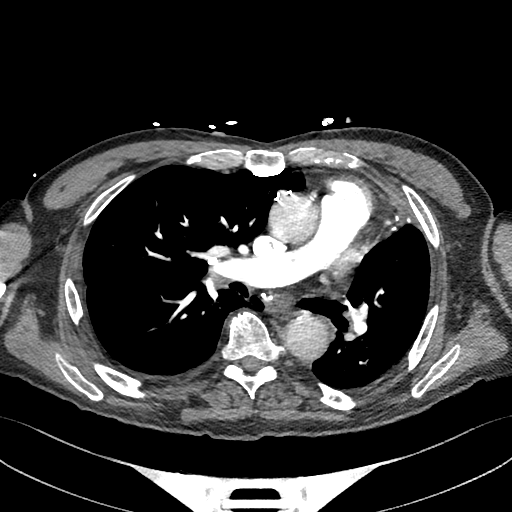
[im 264/396  lung]
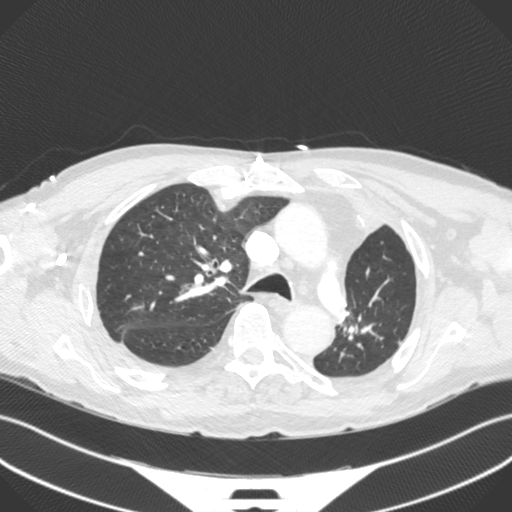
[im 286/396  soft-tissue]
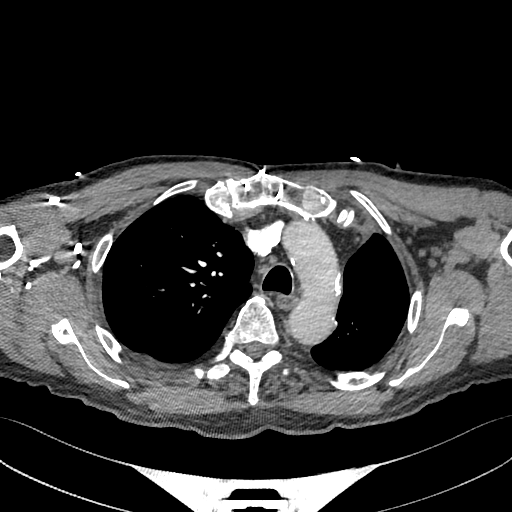
[im 308/396  lung]
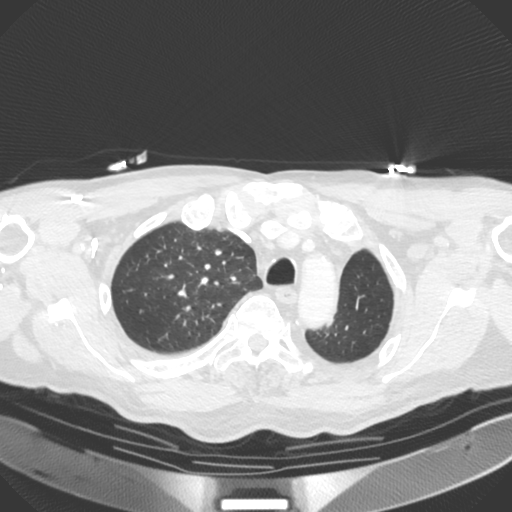
[im 352/396  soft-tissue]
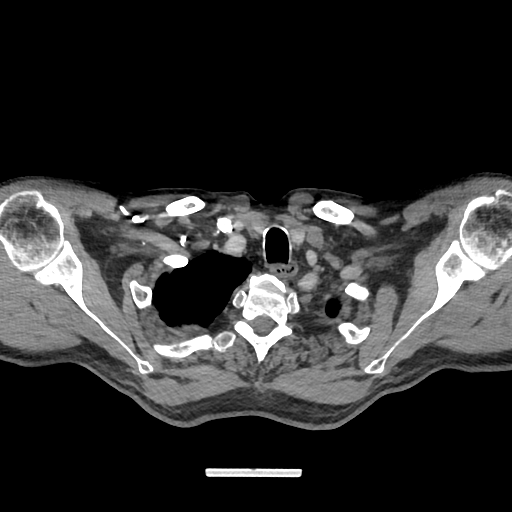
[im 374/396  lung]
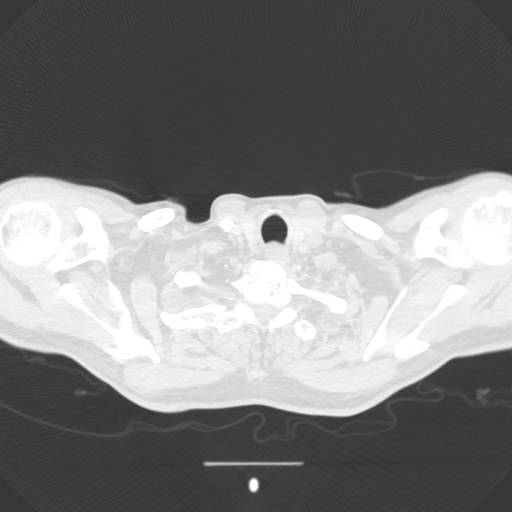

[Series 8: cor · coronal · 0.55mm/px · 3 of 133 slices shown]
[im 34/133  soft-tissue]
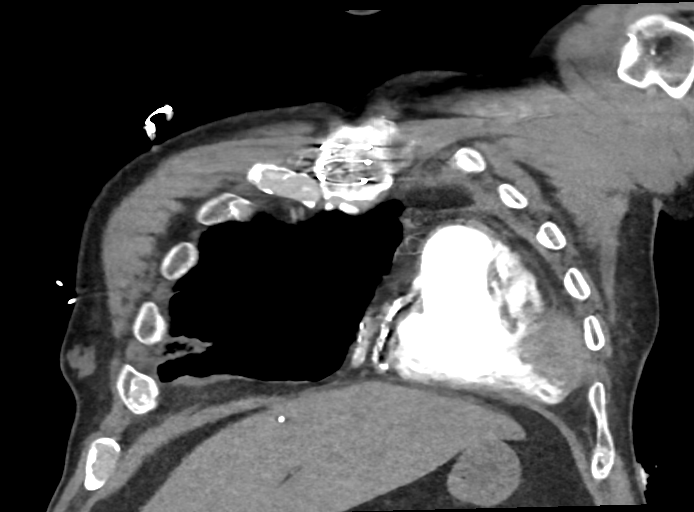
[im 67/133  soft-tissue]
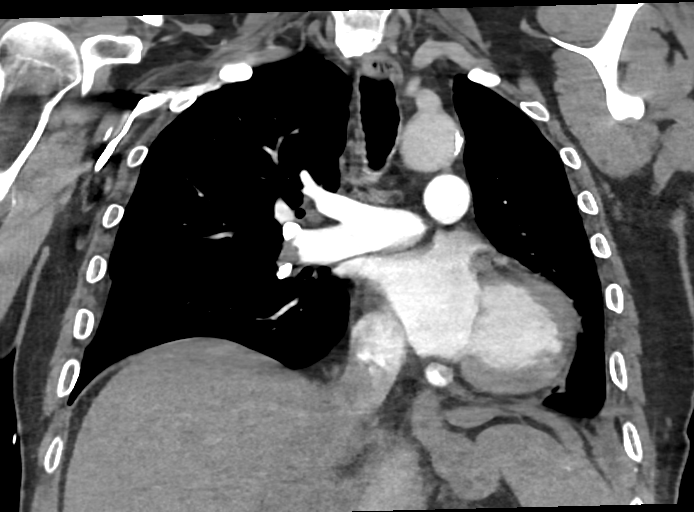
[im 100/133  soft-tissue]
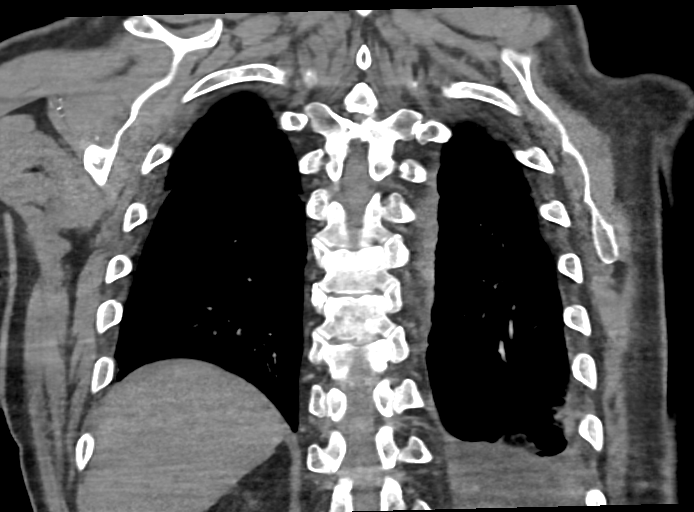

[16 of 46 positions shown; findings below may reference images not displayed]

FINDINGS: Cardiovascular: Atherosclerotic calcifications of the thoracic aorta
are noted without aneurysmal dilatation or dissection. Changes of
prior coronary bypass grafting are seen. Mild cardiac enlargement is
noted. Coronary calcifications are identified as well. Pulmonary
artery is well visualized with evidence of filling defect within
right lower and middle lobe branches consistent with pulmonary
embolus. No right heart strain is noted. No pericardial effusion is
seen.

Mediastinum/Nodes: Thoracic inlet is within normal limits. No
sizable hilar or mediastinal adenopathy is noted. Small stable lymph
nodes are seen. Calcified lymph nodes are noted within the right
hila and subcarinal region consistent with prior granulomatous
disease. The esophagus as visualized is within normal limits.

Lungs/Pleura: Changes of prior left upper lobectomy are noted with
volume loss identified. Some scarring is noted in the left lower
lobe. There remains a pleural based density stable in appearance
from the prior PET-CT which was mildly hypermetabolic on prior exam.
Right lung shows stable nodules in the right upper lobe as well as
within the right lower lobe similar to that seen on prior CT
examination. Calcified granulomas are noted as well. Some mild focal
infiltrate is seen within the right middle lobe likely sequelae from
the pulmonary emboli. A few scattered smaller nodules are noted also
stable in appearance from the prior CT.

Upper Abdomen: Visualized upper abdomen again shows a large right
adrenal mass similar to that seen on prior PET-CT. Known left
adrenal mass is not well visualized on this exam.No other upper
abdominal abnormality is seen.

Musculoskeletal: Degenerative changes of the thoracic spine are
noted. Lytic lesion is noted in the left half of the T1 vertebra
similar to that seen on prior PET-CT. Pathologic rib fracture is
noted in the right fourth rib laterally which corresponds to a
hypermetabolic lesion seen on prior PET-CT. No other definitive rib
fractures are seen. The known T10 vertebral body lesion is again
seen and stable from the prior PET-CT.

Review of the MIP images confirms the above findings.
IMPRESSION: Findings consistent with pulmonary embolism involving the right
lower lobe and right middle lobe branches. No right heart strain is
noted. Some associated new airspace opacity is noted in the right
middle lobe likely related to the underlying embolus. No definitive
infarct is noted.

Changes consistent with prior left upper lobectomy consistent with
the given clinical history. Scattered pulmonary nodules are noted
unchanged from recent PET-CT from [DATE].

Multiple bony metastatic lesions with a pathologic fracture
involving the right fourth rib laterally.

Stable appearing large right adrenal lesion consistent with
metastatic disease.

Aortic Atherosclerosis ([E7]-[E7]).

Critical Value/emergent results were called by telephone at the time
of interpretation on [DATE] at [DATE] to GINTER,PA ,
who verbally acknowledged these results.

## 2021-05-04 IMAGING — CR DG CHEST 2V
2 series · 2 of 2 positions shown · non-contrast
Comparison: [DATE]

CLINICAL DATA: Chest pain

EXAM:
CHEST - 2 VIEW

[chest pa]
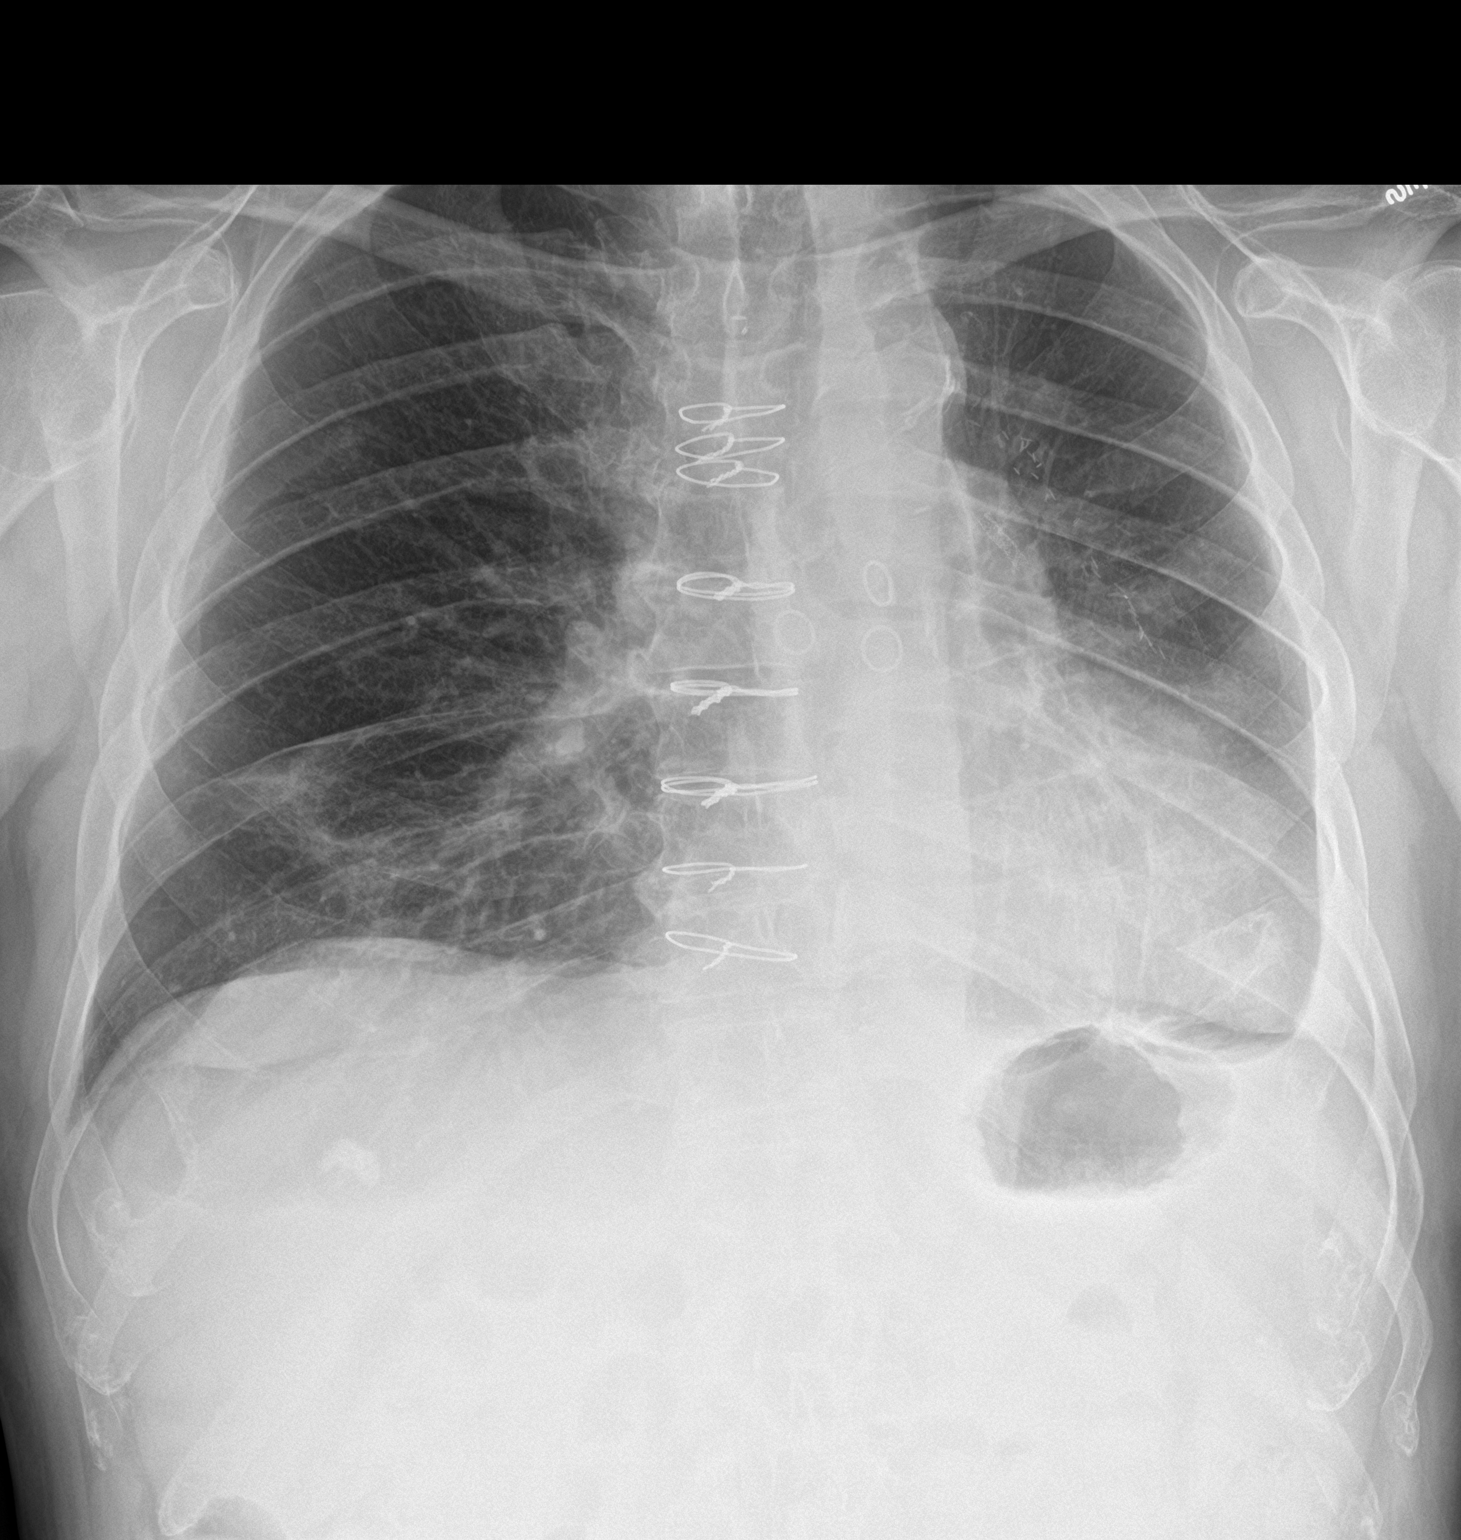

[chest lat]
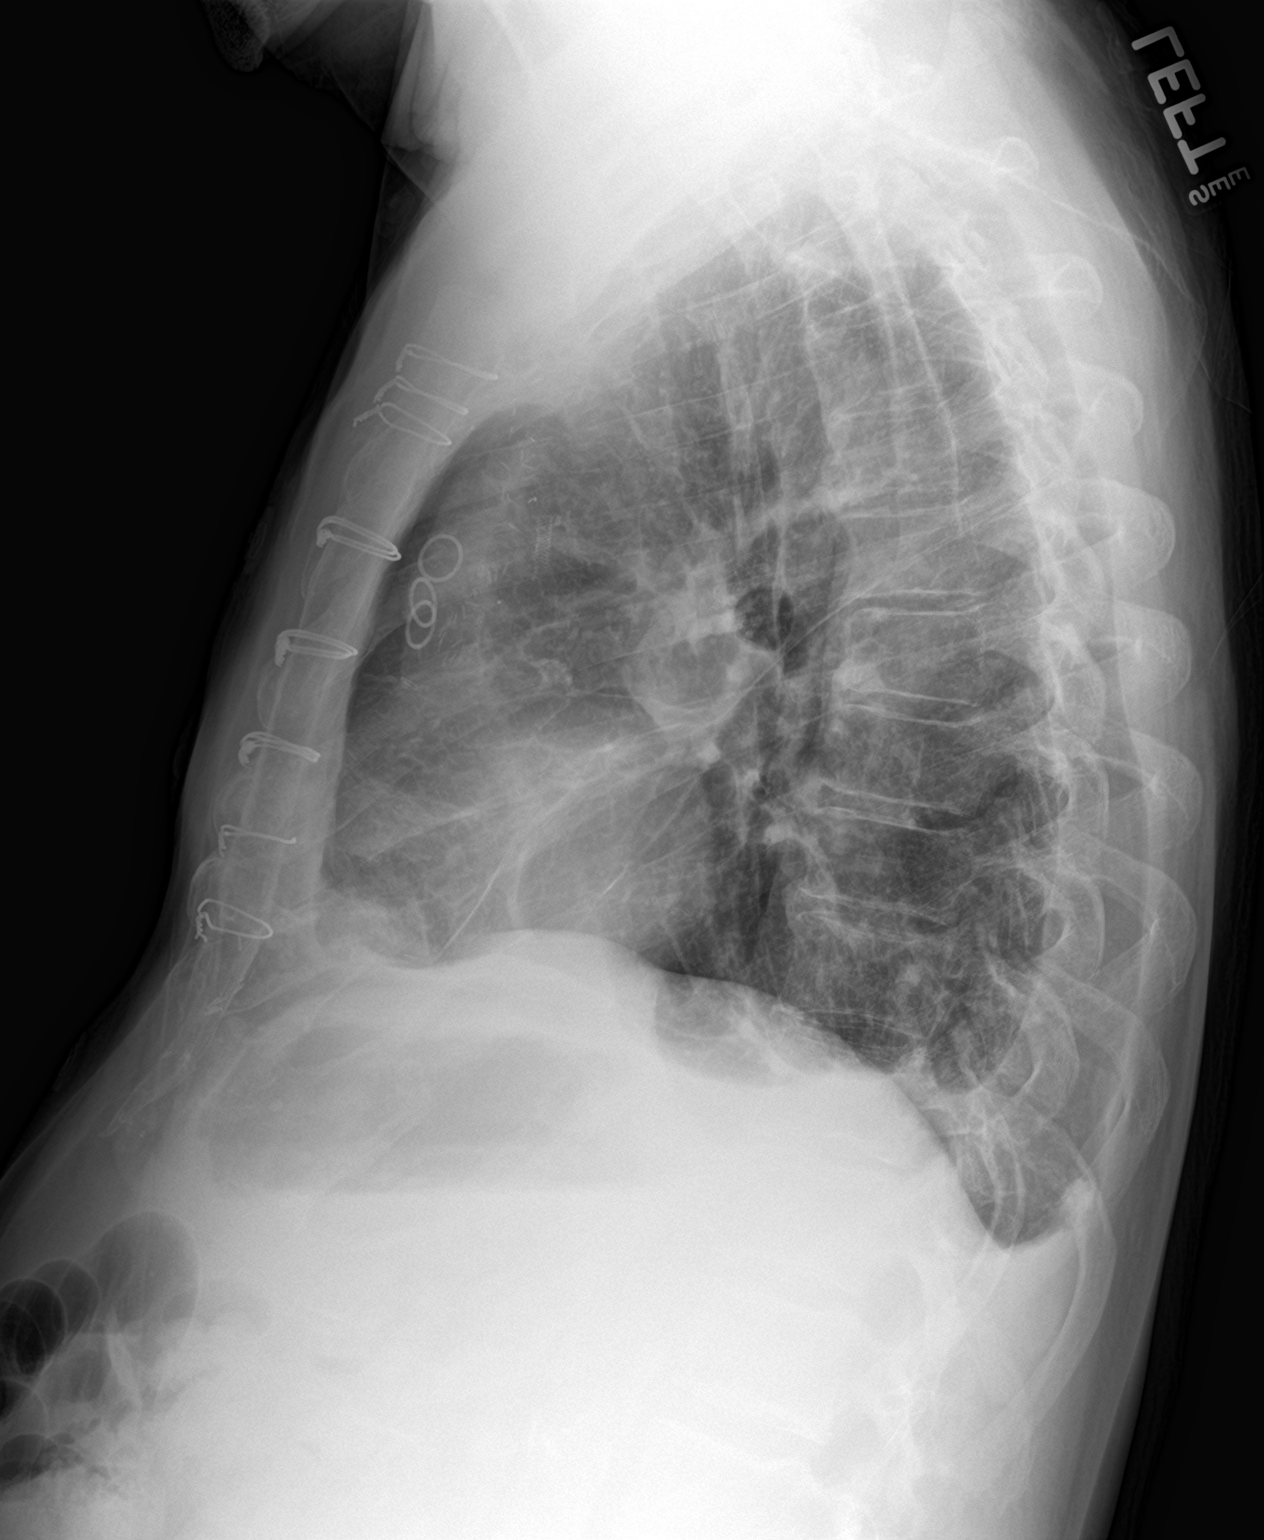

[2 of 2 positions shown; findings below may reference images not displayed]

FINDINGS: Blunting of left costophrenic angle, likely small effusion. Scarring
in the left lung base.

Right base atelectasis. Heart is normal size. Prior CABG. No acute
bony abnormality.
IMPRESSION: Small left pleural effusion. Left basilar scarring and right base
atelectasis.

## 2021-05-04 MED ORDER — ROSUVASTATIN CALCIUM 20 MG PO TABS
40.0000 mg | ORAL_TABLET | Freq: Every day | ORAL | Status: DC
  Administered 2021-05-04 – 2021-05-05 (×2): 40 mg via ORAL
  Filled 2021-05-04 (×2): qty 2

## 2021-05-04 MED ORDER — ASPIRIN EC 81 MG PO TBEC
81.0000 mg | DELAYED_RELEASE_TABLET | Freq: Every day | ORAL | Status: DC
  Administered 2021-05-04 – 2021-05-05 (×2): 81 mg via ORAL
  Filled 2021-05-04 (×2): qty 1

## 2021-05-04 MED ORDER — EZETIMIBE 10 MG PO TABS
10.0000 mg | ORAL_TABLET | Freq: Every day | ORAL | Status: DC
  Administered 2021-05-04 – 2021-05-05 (×2): 10 mg via ORAL
  Filled 2021-05-04 (×2): qty 1

## 2021-05-04 MED ORDER — ONDANSETRON HCL 4 MG/2ML IJ SOLN: 4.0000 mg | Freq: Four times a day (QID) | INTRAMUSCULAR | Status: DC | PRN

## 2021-05-04 MED ORDER — FOLIC ACID 1 MG PO TABS
500.0000 ug | ORAL_TABLET | Freq: Every day | ORAL | Status: DC
  Administered 2021-05-05 – 2021-05-06 (×2): 0.5 mg via ORAL
  Filled 2021-05-04 (×2): qty 1

## 2021-05-04 MED ORDER — HEPARIN BOLUS VIA INFUSION
5500.0000 [IU] | Freq: Once | INTRAVENOUS | Status: AC
  Administered 2021-05-04: 5500 [IU] via INTRAVENOUS
  Filled 2021-05-04: qty 5500

## 2021-05-04 MED ORDER — MORPHINE SULFATE (PF) 4 MG/ML IV SOLN
4.0000 mg | Freq: Once | INTRAVENOUS | Status: AC
  Administered 2021-05-04: 4 mg via INTRAVENOUS
  Filled 2021-05-04: qty 1

## 2021-05-04 MED ORDER — SERTRALINE HCL 50 MG PO TABS
50.0000 mg | ORAL_TABLET | Freq: Two times a day (BID) | ORAL | Status: DC
  Administered 2021-05-04 – 2021-05-06 (×4): 50 mg via ORAL
  Filled 2021-05-04 (×4): qty 1

## 2021-05-04 MED ORDER — ONDANSETRON HCL 4 MG PO TABS: 4.0000 mg | ORAL_TABLET | Freq: Four times a day (QID) | ORAL | Status: DC | PRN

## 2021-05-04 MED ORDER — ACETAMINOPHEN 650 MG RE SUPP: 650.0000 mg | Freq: Four times a day (QID) | RECTAL | Status: DC | PRN

## 2021-05-04 MED ORDER — HEPARIN (PORCINE) 25000 UT/250ML-% IV SOLN
2400.0000 [IU]/h | INTRAVENOUS | Status: DC
  Administered 2021-05-04: 1400 [IU]/h via INTRAVENOUS
  Administered 2021-05-05: 2000 [IU]/h via INTRAVENOUS
  Filled 2021-05-04 (×5): qty 250

## 2021-05-04 MED ORDER — IOHEXOL 350 MG/ML SOLN
50.0000 mL | Freq: Once | INTRAVENOUS | Status: AC | PRN
  Administered 2021-05-04: 50 mL via INTRAVENOUS

## 2021-05-04 MED ORDER — MORPHINE SULFATE (PF) 2 MG/ML IV SOLN
2.0000 mg | INTRAVENOUS | Status: DC | PRN
  Administered 2021-05-05 (×2): 4 mg via INTRAVENOUS
  Filled 2021-05-04 (×2): qty 2

## 2021-05-04 MED ORDER — ACETAMINOPHEN 325 MG PO TABS: 650.0000 mg | ORAL_TABLET | Freq: Four times a day (QID) | ORAL | Status: DC | PRN

## 2021-05-04 MED ORDER — ONDANSETRON HCL 4 MG/2ML IJ SOLN
4.0000 mg | Freq: Once | INTRAMUSCULAR | Status: AC
  Administered 2021-05-04: 4 mg via INTRAVENOUS
  Filled 2021-05-04: qty 2

## 2021-05-04 MED ORDER — ADULT MULTIVITAMIN W/MINERALS CH
1.0000 | ORAL_TABLET | Freq: Every day | ORAL | Status: DC
  Administered 2021-05-05 – 2021-05-06 (×2): 1 via ORAL
  Filled 2021-05-04 (×2): qty 1

## 2021-05-04 NOTE — ED Provider Notes (Signed)
Emergency Medicine Provider Triage Evaluation Note  John Hinton , a 75 y.o. male  was evaluated in triage.  Pt complains of shortness of breath and R sided chest pain x 2-3 days. Hx of non small cell lung cancer with mets. Had an MRI brain yesterday to rule out mets to this area. When he came home family reports he seemed more confused than normal. He was forgetting if he was taking his medications. They thought he was keeping a log of the times however when they looked at the paper the timing did not make any sense. Breathing worsened today prompting ED visit. No fevers.   Review of Systems  Positive: + SOB, chest pain, confusion Negative: - nausea, vomiting, urinary symptoms, headache, speech changes  Physical Exam  BP 107/71 (BP Location: Right Arm)   Pulse (!) 111   Temp 98.8 F (37.1 C) (Oral)   Resp (!) 24   SpO2 100%  Gen:   Awake, no distress   Resp:  Tachypneic. Shallow breath sounds.  MSK:   Moves extremities without difficulty  Other:  Tachycardic. Forgetful. Following commands without difficulty. A&O x 4.   Medical Decision Making  Medically screening exam initiated at 4:41 PM.  Appropriate orders placed.  [name] was informed that the remainder of the evaluation will be completed by another provider, this initial triage assessment does not replace that evaluation, and the importance of remaining in the ED until their evaluation is complete.     Eustaquio Maize, PA-C 05/04/21 1644    Davonna Belling, MD 05/04/21 2029

## 2021-05-04 NOTE — ED Provider Notes (Signed)
[facility] EMERGENCY DEPARTMENT Provider Note   CSN: [id number] Arrival date & time: 05/04/21  1610     History Chief Complaint  Patient presents with   Chest Pain   Altered Mental Status    Stage 4 cancer    John Hinton is a 75 y.o. male with medical history significant for CAD, dilated cardiomyopathy, stage IV cancer with mets presents for evaluation of chest pain, shortness of breath and confusion.  Apparently started yesterday after family had returned home after MRI.  They are unsure of the results.  Wife at bedside states she noted that patient was confused about his home medications which he typically is not confused about.  Earlier today developed pleuritic chest pain on the right, shortness of breath.  No prior history of PE or DVT.  Patient could not remember how to use the phone and was unable to call his wife to take him to the emergency department at that time.  States he has moderate dyspnea on exertion.  SOB worse with laying flat. Denies LE edema. 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  He denies any headache, syncope, vision changes, numbness, weakness, abdominal pain, urinary complaints.  Has had some intermittent loose stools which he has had previously.  No melena or blood per rectum.  He is not anticoagulated.  Apparently has had prior lung resection due to cancer.  Very weak over the last few days. Did not really get out of bed except for MRI. Needed help to get from wheel chair to bed due to weakness. New non productive cough without hemoptysis.  Level 5 caveat- AMS  Collateral obtained from wife at bedside  HPI     Past Medical History:  Diagnosis Date   BPH (benign prostatic hypertrophy)    Bradycardia    a. H/o asymptomatic bradycardia.   Coronary artery disease    a. s/p CABG ~2007 with LIMA to LAD, SVG seq to OM1 and OM2, SVG to PDA and SVG to diag. b. Abnormal nuc 03/2015 - s/p DES to  SVG-OM1-OM2. 01/29/17 POBA with cutting balloon to SVG-->OM.  No ischemia Lexi myoview 2021   DCM (dilated cardiomyopathy) ([place]) 09/17/2015   mixed by cMRI 03/2021 with EF 39% with non viable infarct in basal to mid lateral wall and LGE pattern c/w nonischemic component as well. Mild RV dysfunction RVEF 42%.   Dyslipidemia    Dyspnea    r/t lung mass   ED (erectile dysfunction)    Fear of heights    Goals of care, counseling/discussion 06/19/2020   Hypertension    Non-small cell carcinoma of lung, stage 4, left ([place]) 06/19/2020   OSA (obstructive sleep apnea) 07/12/2015   Moderate OSA with AHI 16.5/hr now on CPAP at 8cm H2O, uses cpap nightly      RBBB    Shoulder, capsulitis, adhesive    Left Shoulder    Patient Active Problem List   Diagnosis Date Noted   Non-small cell carcinoma of lung, stage 4, left ([place]) 06/19/2020   Goals of care, counseling/discussion 06/19/2020   S/P partial lobectomy of lung 05/22/2020   DCM (dilated cardiomyopathy) ([place]) 09/17/2015   OSA (obstructive sleep apnea) 07/12/2015   Bradycardia 08/29/2013   CAD (coronary artery disease), native coronary artery    RBBB    Essential hypertension, benign 08/18/2013   Hyperlipemia 08/18/2013    Past Surgical History:  Procedure Laterality Date   APPENDECTOMY     BRONCHIAL BIOPSY  05/09/2020  Procedure: BRONCHIAL BIOPSIES;  Surgeon: [name], DO;  Location: [facility];  Service: Pulmonary;;   BRONCHIAL BRUSHINGS  05/09/2020   Procedure: BRONCHIAL BRUSHINGS;  Surgeon: [name], DO;  Location: [facility];  Service: Pulmonary;;   BRONCHIAL NEEDLE ASPIRATION BIOPSY  05/09/2020   Procedure: BRONCHIAL NEEDLE ASPIRATION BIOPSIES;  Surgeon: [name], DO;  Location: [facility];  Service: Pulmonary;;   BRONCHIAL WASHINGS  05/09/2020   Procedure: BRONCHIAL WASHINGS;  Surgeon: [name], DO;  Location: [facility];  Service: Pulmonary;;   CARDIAC CATHETERIZATION N/A 04/05/2015    Procedure: Left Heart Cath and Coronary Angiography;  Surgeon: Sherren Mocha, MD;  Location: [facility];  Service: Cardiovascular;  Laterality: N/A;   COLONOSCOPY     CORONARY ARTERY BYPASS GRAFT     w/LIMA to LAD, seq SVG to OM1 and OM2, SVG to PDA and SVG to Diagonal   CORONARY BALLOON ANGIOPLASTY N/A 01/29/2017   Procedure: Coronary Balloon Angioplasty;  Surgeon: Martinique, Peter M, MD;  Location: [facility];  Service: Cardiovascular;  Laterality: N/A;   FIDUCIAL MARKER PLACEMENT  05/09/2020   Procedure: FIDUCIAL MARKER PLACEMENT;  Surgeon: [name], DO;  Location: [facility];  Service: Pulmonary;;   INTERCOSTAL NERVE BLOCK Left 05/22/2020   Procedure: INTERCOSTAL NERVE BLOCK;  Surgeon: Lajuana Matte, MD;  Location: [facility];  Service: Thoracic;  Laterality: Left;   LEFT HEART CATH AND CORS/GRAFTS ANGIOGRAPHY N/A 01/29/2017   Procedure: Left Heart Cath and Cors/Grafts Angiography;  Surgeon: Martinique, Peter M, MD;  Location: [facility];  Service: Cardiovascular;  Laterality: N/A;   NODE DISSECTION N/A 05/22/2020   Procedure: NODE DISSECTION;  Surgeon: Lajuana Matte, MD;  Location: [facility];  Service: Thoracic;  Laterality: N/A;   VIDEO BRONCHOSCOPY WITH ENDOBRONCHIAL NAVIGATION N/A 05/09/2020   Procedure: VIDEO BRONCHOSCOPY WITH ENDOBRONCHIAL NAVIGATION;  Surgeon: [name], DO;  Location: [facility];  Service: Pulmonary;  Laterality: N/A;   VIDEO BRONCHOSCOPY WITH ENDOBRONCHIAL ULTRASOUND N/A 05/09/2020   Procedure: VIDEO BRONCHOSCOPY WITH ENDOBRONCHIAL ULTRASOUND;  Surgeon: [name], DO;  Location: [facility];  Service: Pulmonary;  Laterality: N/A;       Family History  Problem Relation Age of Onset   Lung cancer Mother    Lung cancer Father    Heart disease Father    Heart failure Father    Cancer Sister     Social History   Tobacco Use   Smoking status: Former    Types: Cigarettes    Quit date: 07/21/1983    Years since  quitting: 37.8   Smokeless tobacco: Former  Scientific laboratory technician Use: Never used  Substance Use Topics   Alcohol use: Yes    Alcohol/week: 20.0 - 28.0 standard drinks    Types: 20 - 28 Standard drinks or equivalent per week    Comment: daily - 20-28 /week   Drug use: No    Home Medications Prior to Admission medications   Medication Sig Start Date End Date Taking? Authorizing Provider  aspirin 81 MG tablet Take 81 mg by mouth at bedtime.     [provider]  cholecalciferol (VITAMIN D) 1000 UNITS tablet Take 1,000 Units by mouth at bedtime.     [provider]  clopidogrel (PLAVIX) 75 MG tablet Take 1 tablet (75 mg total) by mouth daily. 06/25/20   Sueanne Margarita, MD  Cyanocobalamin (B-12 PO) Take 1,000 mcg by mouth daily.  [provider]  doxazosin (CARDURA) 2 MG tablet TAKE ONE TABLET BY MOUTH DAILY 03/19/21   Sueanne Margarita, MD  ezetimibe (ZETIA) 10 MG tablet Take 1 tablet (10 mg total) by mouth daily. 06/25/20   Sueanne Margarita, MD  folic acid (FOLVITE) 657 MCG tablet Take 400 mcg by mouth daily.    [provider]  Multiple Vitamin (MULTIVITAMIN) tablet Take 1 tablet by mouth daily.    [provider]  nitroGLYCERIN (NITROSTAT) 0.4 MG SL tablet Place 1 tablet (0.4 mg total) under the tongue every 5 (five) minutes as needed for chest pain. 01/27/17   Bhagat, Crista Luria, PA  rosuvastatin (CRESTOR) 40 MG tablet TAKE ONE TABLET BY MOUTH EVERY NIGHT AT BEDTIME 03/19/21   Turner, Eber Hong, MD  sacubitril-valsartan (ENTRESTO) 24-26 MG Take 1 tablet by mouth 2 (two) times daily. 04/25/21   Sueanne Margarita, MD  sertraline (ZOLOFT) 50 MG tablet Take 50 mg by mouth in the morning and at bedtime.     [provider]  sotorasib (LUMAKRAS) 120 MG TABS Take 8 tablets (960 mg) by mouth daily after breakfast. 04/28/21   [name], MD  spironolactone (ALDACTONE) 25 MG tablet TAKE ONE TABLET BY MOUTH DAILY 06/25/20   Sueanne Margarita, MD   traMADol (ULTRAM) 50 MG tablet Take 1 tablet (50 mg total) by mouth every 6 (six) hours as needed. 04/28/21   [name], MD  prochlorperazine (COMPAZINE) 10 MG tablet Take 1 tablet (10 mg total) by mouth every 6 (six) hours as needed (Nausea or vomiting). 06/24/20 04/25/21  [name], MD    Allergies    Amlodipine  Review of Systems   Review of Systems  Unable to perform ROS: Mental status change  Constitutional: Negative.   Respiratory:  Positive for cough and shortness of breath.   Cardiovascular:  Positive for chest pain (Right). Negative for palpitations and leg swelling.  Gastrointestinal: Negative.   Genitourinary: Negative.   Musculoskeletal: Negative.   Skin: Negative.   Neurological:  Positive for weakness.  All other systems reviewed and are negative.  Physical Exam Updated Vital Signs BP 118/70   Pulse (!) 113   Temp 98.8 F (37.1 C) (Oral)   Resp (!) 23   SpO2 (!) 89%   Physical Exam Vitals and nursing note reviewed.  Constitutional:      General: He is not in acute distress.    Appearance: He is well-developed. He is not ill-appearing, toxic-appearing or diaphoretic.     Comments: Alert however appears confused with some conversation  HENT:     Head: Atraumatic.     Jaw: There is normal jaw occlusion.     Mouth/Throat:     Lips: Pink.  Eyes:     Extraocular Movements: Extraocular movements intact.     Conjunctiva/sclera: Conjunctivae normal.     Pupils: Pupils are equal, round, and reactive to light.     Comments: PERRLA  Neck:     Trachea: Trachea and phonation normal.     Comments: Full ROM without difficulty Cardiovascular:     Rate and Rhythm: Normal rate and regular rhythm.     Pulses: Normal pulses.          Radial pulses are 2+ on the right side and 2+ on the left side.       Dorsalis pedis pulses are 2+ on the right side and 2+ on the left side.     Heart sounds: Normal heart  sounds.  Pulmonary:     Effort: Pulmonary effort is  normal. Tachypnea present. No respiratory distress.     Comments: Course lungs sounds Bl Chest:     Comments: Midline scar to chest that surrounding erythema or warmth.  No vesicular lesion to chest wall Abdominal:     General: Bowel sounds are normal. There is no distension.     Palpations: Abdomen is soft.     Tenderness: There is no abdominal tenderness. There is no right CVA tenderness, left CVA tenderness, guarding or rebound.     Hernia: No hernia is present.     Comments: Soft nontender without rebound or guard  Musculoskeletal:        General: Normal range of motion.     Cervical back: Full passive range of motion without pain, normal range of motion and neck supple.     Comments: No bony tenderness.  Moves all 4 extremities without difficulty.  Compartments soft.  Nontender bilateral  Feet:     Comments: No LE Edema Skin:    General: Skin is warm and dry.     Capillary Refill: Capillary refill takes less than 2 seconds.     Comments: No rash or lesions  Neurological:     Mental Status: He is alert and oriented to person, place, and time. He is confused.     Cranial Nerves: Cranial nerves are intact.     Motor: Motor function is intact.     Coordination: Coordination is intact.     Comments: CN 2-12 intact 5/5 strength to BL upper and lower extremities  Neg pronator drift Follows commands Alert to person, place, time however seems confused in conversation, cannot follow, does not really know why he is here    ED Results / Procedures / Treatments   Labs (all labs ordered are listed, but only abnormal results are displayed) Labs Reviewed  COMPREHENSIVE METABOLIC PANEL - Abnormal; Notable for the following components:      Result Value   Glucose, Bld 109 (*)    Albumin 3.4 (*)    All other components within normal limits  CBC WITH DIFFERENTIAL/PLATELET - Abnormal; Notable for the following components:   RBC 3.81 (*)    Hemoglobin 11.7 (*)    HCT 36.6 (*)    All other  components within normal limits  BRAIN NATRIURETIC PEPTIDE - Abnormal; Notable for the following components:   B Natriuretic Peptide 143.7 (*)    All other components within normal limits  RESP PANEL BY RT-PCR (FLU A&B, COVID) ARPGX2  CULTURE, BLOOD (ROUTINE X 2)  CULTURE, BLOOD (ROUTINE X 2)  AMMONIA  LACTIC ACID, PLASMA  URINALYSIS, ROUTINE W REFLEX MICROSCOPIC  LACTIC ACID, PLASMA  PROTIME-INR  APTT  TROPONIN I (HIGH SENSITIVITY)  TROPONIN I (HIGH SENSITIVITY)    EKG None  Radiology DG Chest 2 View  Result Date: 05/04/2021 CLINICAL DATA:  Chest pain EXAM: CHEST - 2 VIEW COMPARISON:  01/10/2021 FINDINGS: Blunting of left costophrenic angle, likely small effusion. Scarring in the left lung base. Right base atelectasis. Heart is normal size. Prior CABG. No acute bony abnormality. IMPRESSION: Small left pleural effusion. Left basilar scarring and right base atelectasis. Electronically Signed   By: Rolm Baptise M.D.   On: 05/04/2021 17:16   CT HEAD WO CONTRAST (5MM)  Result Date: 05/04/2021 CLINICAL DATA:  Altered mental status EXAM: CT HEAD WITHOUT CONTRAST TECHNIQUE: Contiguous axial images were obtained from the base of the skull through the vertex without  intravenous contrast. COMPARISON:  None. FINDINGS: Brain: Mild age related volume loss. No acute intracranial abnormality. Specifically, no hemorrhage, hydrocephalus, mass lesion, acute infarction, or significant intracranial injury. Vascular: No hyperdense vessel or unexpected calcification. Skull: No acute calvarial abnormality. Sinuses/Orbits: No acute findings Other: None IMPRESSION: No acute intracranial abnormality. Electronically Signed   By: Rolm Baptise M.D.   On: 05/04/2021 19:23   CT Angio Chest PE W and/or Wo Contrast  Result Date: 05/04/2021 CLINICAL DATA:  History of lung carcinoma with chest pain, initial encounter EXAM: CT ANGIOGRAPHY CHEST WITH CONTRAST TECHNIQUE: Multidetector CT imaging of the chest was  performed using the standard protocol during bolus administration of intravenous contrast. Multiplanar CT image reconstructions and MIPs were obtained to evaluate the vascular anatomy. CONTRAST:  9mL OMNIPAQUE IOHEXOL 350 MG/ML SOLN COMPARISON:  04/23/2021 PET-CT FINDINGS: Cardiovascular: Atherosclerotic calcifications of the thoracic aorta are noted without aneurysmal dilatation or dissection. Changes of prior coronary bypass grafting are seen. Mild cardiac enlargement is noted. Coronary calcifications are identified as well. Pulmonary artery is well visualized with evidence of filling defect within right lower and middle lobe branches consistent with pulmonary embolus. No right heart strain is noted. No pericardial effusion is seen. Mediastinum/Nodes: Thoracic inlet is within normal limits. No sizable hilar or mediastinal adenopathy is noted. Small stable lymph nodes are seen. Calcified lymph nodes are noted within the right hila and subcarinal region consistent with prior granulomatous disease. The esophagus as visualized is within normal limits. Lungs/Pleura: Changes of prior left upper lobectomy are noted with volume loss identified. Some scarring is noted in the left lower lobe. There remains a pleural based density stable in appearance from the prior PET-CT which was mildly hypermetabolic on prior exam. Right lung shows stable nodules in the right upper lobe as well as within the right lower lobe similar to that seen on prior CT examination. Calcified granulomas are noted as well. Some mild focal infiltrate is seen within the right middle lobe likely sequelae from the pulmonary emboli. A few scattered smaller nodules are noted also stable in appearance from the prior CT. Upper Abdomen: Visualized upper abdomen again shows a large right adrenal mass similar to that seen on prior PET-CT. Known left adrenal mass is not well visualized on this exam.No other upper abdominal abnormality is seen. Musculoskeletal:  Degenerative changes of the thoracic spine are noted. Lytic lesion is noted in the left half of the T1 vertebra similar to that seen on prior PET-CT. Pathologic rib fracture is noted in the right fourth rib laterally which corresponds to a hypermetabolic lesion seen on prior PET-CT. No other definitive rib fractures are seen. The known T10 vertebral body lesion is again seen and stable from the prior PET-CT. Review of the MIP images confirms the above findings. IMPRESSION: Findings consistent with pulmonary embolism involving the right lower lobe and right middle lobe branches. No right heart strain is noted. Some associated new airspace opacity is noted in the right middle lobe likely related to the underlying embolus. No definitive infarct is noted. Changes consistent with prior left upper lobectomy consistent with the given clinical history. Scattered pulmonary nodules are noted unchanged from recent PET-CT from 04/23/2021. Multiple bony metastatic lesions with a pathologic fracture involving the right fourth rib laterally. Stable appearing large right adrenal lesion consistent with metastatic disease. Aortic Atherosclerosis (ICD10-I70.0). Critical Value/emergent results were called by telephone at the time of interpretation on 05/04/2021 at 7:34 pm to [facility] , who verbally acknowledged these results. Electronically  Signed   By: Inez Catalina M.D.   On: 05/04/2021 19:35    Procedures .Critical Care Performed by: Nettie Elm, PA-C Authorized by: Nettie Elm, PA-C   Critical care provider statement:    Critical care time (minutes):  35   Critical care was necessary to treat or prevent imminent or life-threatening deterioration of the following conditions:  Cardiac failure, circulatory failure and respiratory failure   Critical care was time spent personally by me on the following activities:  Blood draw for specimens, development of treatment plan with patient or surrogate,  discussions with consultants, discussions with primary provider, evaluation of patient's response to treatment, examination of patient, obtaining history from patient or surrogate, review of old charts, re-evaluation of patient's condition, pulse oximetry, ordering and review of radiographic studies, ordering and review of laboratory studies and ordering and performing treatments and interventions   Medications Ordered in ED Medications  morphine 4 MG/ML injection 4 mg (has no administration in time range)  ondansetron (ZOFRAN) injection 4 mg (has no administration in time range)  iohexol (OMNIPAQUE) 350 MG/ML injection 50 mL (50 mLs Intravenous Contrast Given 05/04/21 1913)   ED Course  I have reviewed the triage vital signs and the nursing notes.  Pertinent labs & imaging results that were available during my care of the patient were reviewed by me and considered in my medical decision making (see chart for details).  75 here for evaluation of altered mental status, chest pain and shortness of breath.  He is afebrile, nonseptic appearing.  Coarse lung sounds, right greater than left.  He is tachycardic, tachypneic however not hypoxic currently.  No clear infectious source.  He does not appear clinically fluid overloaded.  ANO x3 however does appear to get confused during normal conversation which wife states is not at baseline.  We will plan on broad work-up.  Labs and imaging personally reviewed and interpreted:  CBC without leukocytosis, hemoglobin 19.4 Metabolic panel without significant normality Troponin 13 BNP 143 Ammonia within normal limit Lactic acid 0.9 Chest x-ray possible small pleural effusion CT head without any significant CTA chest with right PE without heart strain, pathologic rib fracture EKG without STEMI  Unclear etiology of patient's confusion. Will need further WU on admission. No urinary complaints, obvious  infectious cause. Question medications given family  states patient had gotten confused with his medication administration?  CT scan does show right-sided PE as well as pathologic rib fractures.  Apparently hypoxic into the high 80s.  Placed on nasal cannula.  Heparin started.  Will need admission.  Reassuring no infarct, heart strain, troponin, lactic acid within normal.  No evidence of shock on exam.   CONSULT with Dr. Alcario Drought with [facility] who agrees to evaluate patient for admission.  The patient appears reasonably stabilized for admission considering the current resources, flow, and capabilities available in the ED at this time, and I doubt any other [facility] requiring further screening and/or treatment in the ED prior to admission.    Patient discussed with attending who agrees with above treatment, plan and disposition    MDM Rules/Calculators/A&P                           Keshun Berrett was evaluated in Emergency Department on 05/04/2021 for the symptoms described in the history of present illness. He was evaluated in the context of the global COVID-19 pandemic, which necessitated consideration that the patient might be at risk for  infection with the SARS-CoV-2 virus that causes COVID-19. Institutional protocols and algorithms that pertain to the evaluation of patients at risk for COVID-19 are in a state of rapid change based on information released by regulatory bodies including the CDC and federal and state organizations. These policies and algorithms were followed during the patient's care in the ED.  Final Clinical Impression(s) / ED Diagnoses Final diagnoses:  Altered mental status, unspecified altered mental status type  Multiple subsegmental pulmonary emboli without acute cor pulmonale (HCC)  Metastatic malignant neoplasm, unspecified site (HCC)  Elevated brain natriuretic peptide (BNP) level    Rx / DC Orders ED Discharge Orders     None        Lorana Maffeo A, PA-C 05/04/21 2045    Teressa Lower, MD 05/04/21  2132

## 2021-05-04 NOTE — ED Notes (Signed)
Provider at bedside

## 2021-05-04 NOTE — Progress Notes (Signed)
ANTICOAGULATION CONSULT NOTE - Initial Consult  Pharmacy Consult for heparin Indication: pulmonary embolus  Allergies  Allergen Reactions   Amlodipine Swelling    Swelling of ankles    Patient Measurements:   Heparin Dosing Weight: TBW  Vital Signs: Temp: 98.8 F (37.1 C) (10/16 1639) Temp Source: Oral (10/16 1639) BP: 118/70 (10/16 1930) Pulse Rate: 113 (10/16 1930)  Labs: Recent Labs    05/04/21 1646  HGB 11.7*  HCT 36.6*  PLT 267  CREATININE 0.93  TROPONINIHS 13    Estimated Creatinine Clearance: 81.7 mL/min (by C-G formula based on SCr of 0.93 mg/dL).   Medical History: Past Medical History:  Diagnosis Date   BPH (benign prostatic hypertrophy)    Bradycardia    a. H/o asymptomatic bradycardia.   Coronary artery disease    a. s/p CABG ~2007 with LIMA to LAD, SVG seq to OM1 and OM2, SVG to PDA and SVG to diag. b. Abnormal nuc 03/2015 - s/p DES to SVG-OM1-OM2. 01/29/17 POBA with cutting balloon to SVG-->OM.  No ischemia Lexi myoview 2021   DCM (dilated cardiomyopathy) ([place]) 09/17/2015   mixed by cMRI 03/2021 with EF 39% with non viable infarct in basal to mid lateral wall and LGE pattern c/w nonischemic component as well. Mild RV dysfunction RVEF 42%.   Dyslipidemia    Dyspnea    r/t lung mass   ED (erectile dysfunction)    Fear of heights    Goals of care, counseling/discussion 06/19/2020   Hypertension    Non-small cell carcinoma of lung, stage 4, left ([place]) 06/19/2020   OSA (obstructive sleep apnea) 07/12/2015   Moderate OSA with AHI 16.5/hr now on CPAP at 8cm H2O, uses cpap nightly      RBBB    Shoulder, capsulitis, adhesive    Left Shoulder     Assessment: 85 YOM presenting with SOB and CP x 2-3d, CT shows PE in right lower lobe and right middle lobe, no RHS.  He is not on anticoagulation PTA, chronic anemia stable, plts 267  Goal of Therapy:  Heparin level 0.3-0.7 units/ml Monitor platelets by anticoagulation protocol: Yes   Plan:  Heparin  5500 units IV x 1, and gtt at 1400 units/hr F/u 8 hour heparin level F/u long term AC plan and ability to transition to PO  Bertis Ruddy, PharmD Clinical Pharmacist ED Pharmacist Phone # [phone] 05/04/2021 7:55 PM

## 2021-05-04 NOTE — ED Notes (Signed)
Transported to radiology 

## 2021-05-04 NOTE — ED Triage Notes (Signed)
Pt has stage 4 lung cancer with metastasis to adrenal glands and bone.  He reports R sided chest pain x 2-3 days and back pain that he has been taking Tramadol for.  Pt is SOB and anxious on arrival.  Family reports confusion since last night.  They thought pt was keeping a log of when he took pain medication but realized that what he was writing down didn't make sense.  MRI yesterday to R/u additional mets but hasn't received the results.

## 2021-05-04 NOTE — ED Notes (Signed)
Returned from radiology at this time 

## 2021-05-04 NOTE — H&P (Signed)
History and Physical    John Hinton HQI:[id number] DOB: July 16, 1946 DOA: 05/04/2021  PCP: [name], MD  Patient coming from: Home  I have personally briefly reviewed patient's old medical records in [place]  Chief Complaint: CP, AMS  HPI: John Hinton is a 75 y.o. male with medical history significant of CAD, DCM, stage 4 NSCLC s/p left upper lobectomy.  Pt presents to ED with c/o CP, SOB, confusion.  Pt had MRI brain yesterday.  Wife at bedside states she noted that after he came home, patient was confused about his home medications which he typically is not confused about.  Earlier today developed pleuritic chest pain on the right, shortness of breath.  No prior history of PE or DVT.  Patient could not remember how to use the phone and was unable to call his wife to take him to the emergency department at that time.  States he has moderate dyspnea on exertion.  SOB worse with laying flat.  Denies LE edema.  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  Has new nonproductive cough, no hemoptysis.  Generalized weakness over past few days.  No headache, syncope, vision changes.  Recent PET scan earlier this month with new metastatic dz to adrenal glands, bone, and R lung.   ED Course: CTA chest reveals PEs in RML and RLL, no RHS on CTA  Does have tachycardia to 110s.  Does desat to 90 with activity.  Satting upper 90s at rest.  RR 30.  Review of Systems: As per HPI, otherwise all review of systems negative.  Past Medical History:  Diagnosis Date   BPH (benign prostatic hypertrophy)    Bradycardia    a. H/o asymptomatic bradycardia.   Coronary artery disease    a. s/p CABG ~2007 with LIMA to LAD, SVG seq to OM1 and OM2, SVG to PDA and SVG to diag. b. Abnormal nuc 03/2015 - s/p DES to SVG-OM1-OM2. 01/29/17 POBA with cutting balloon to SVG-->OM.  No ischemia Lexi myoview 2021   DCM (dilated cardiomyopathy) ([place]) 09/17/2015    mixed by cMRI 03/2021 with EF 39% with non viable infarct in basal to mid lateral wall and LGE pattern c/w nonischemic component as well. Mild RV dysfunction RVEF 42%.   Dyslipidemia    Dyspnea    r/t lung mass   ED (erectile dysfunction)    Fear of heights    Goals of care, counseling/discussion 06/19/2020   Hypertension    Non-small cell carcinoma of lung, stage 4, left ([place]) 06/19/2020   OSA (obstructive sleep apnea) 07/12/2015   Moderate OSA with AHI 16.5/hr now on CPAP at 8cm H2O, uses cpap nightly      RBBB    Shoulder, capsulitis, adhesive    Left Shoulder    Past Surgical History:  Procedure Laterality Date   APPENDECTOMY     BRONCHIAL BIOPSY  05/09/2020   Procedure: BRONCHIAL BIOPSIES;  Surgeon: [name], DO;  Location: [facility];  Service: Pulmonary;;   BRONCHIAL BRUSHINGS  05/09/2020   Procedure: BRONCHIAL BRUSHINGS;  Surgeon: [name], DO;  Location: [facility];  Service: Pulmonary;;   BRONCHIAL NEEDLE ASPIRATION BIOPSY  05/09/2020   Procedure: BRONCHIAL NEEDLE ASPIRATION BIOPSIES;  Surgeon: [name], DO;  Location: [facility];  Service: Pulmonary;;   BRONCHIAL WASHINGS  05/09/2020   Procedure: BRONCHIAL WASHINGS;  Surgeon: [name], DO;  Location: [facility];  Service: Pulmonary;;   CARDIAC CATHETERIZATION N/A 04/05/2015   Procedure:  Left Heart Cath and Coronary Angiography;  Surgeon: Sherren Mocha, MD;  Location: [facility];  Service: Cardiovascular;  Laterality: N/A;   COLONOSCOPY     CORONARY ARTERY BYPASS GRAFT     w/LIMA to LAD, seq SVG to OM1 and OM2, SVG to PDA and SVG to Diagonal   CORONARY BALLOON ANGIOPLASTY N/A 01/29/2017   Procedure: Coronary Balloon Angioplasty;  Surgeon: Martinique, Peter M, MD;  Location: [facility];  Service: Cardiovascular;  Laterality: N/A;   FIDUCIAL MARKER PLACEMENT  05/09/2020   Procedure: FIDUCIAL MARKER PLACEMENT;  Surgeon: [name], DO;  Location: [facility];   Service: Pulmonary;;   INTERCOSTAL NERVE BLOCK Left 05/22/2020   Procedure: INTERCOSTAL NERVE BLOCK;  Surgeon: Lajuana Matte, MD;  Location: [place];  Service: Thoracic;  Laterality: Left;   LEFT HEART CATH AND CORS/GRAFTS ANGIOGRAPHY N/A 01/29/2017   Procedure: Left Heart Cath and Cors/Grafts Angiography;  Surgeon: Martinique, Peter M, MD;  Location: [facility];  Service: Cardiovascular;  Laterality: N/A;   NODE DISSECTION N/A 05/22/2020   Procedure: NODE DISSECTION;  Surgeon: Lajuana Matte, MD;  Location: [facility];  Service: Thoracic;  Laterality: N/A;   VIDEO BRONCHOSCOPY WITH ENDOBRONCHIAL NAVIGATION N/A 05/09/2020   Procedure: VIDEO BRONCHOSCOPY WITH ENDOBRONCHIAL NAVIGATION;  Surgeon: [name], DO;  Location: [facility];  Service: Pulmonary;  Laterality: N/A;   VIDEO BRONCHOSCOPY WITH ENDOBRONCHIAL ULTRASOUND N/A 05/09/2020   Procedure: VIDEO BRONCHOSCOPY WITH ENDOBRONCHIAL ULTRASOUND;  Surgeon: [name], DO;  Location: [facility];  Service: Pulmonary;  Laterality: N/A;     reports that he quit smoking about 37 years ago. His smoking use included cigarettes. He has quit using smokeless tobacco. He reports current alcohol use of about 20.0 - 28.0 standard drinks per week. He reports that he does not use drugs.  Allergies  Allergen Reactions   Amlodipine Swelling    Swelling of ankles    Family History  Problem Relation Age of Onset   Lung cancer Mother    Lung cancer Father    Heart disease Father    Heart failure Father    Cancer Sister      Prior to Admission medications   Medication Sig Start Date End Date Taking? Authorizing Provider  aspirin 81 MG tablet Take 81 mg by mouth at bedtime.     [provider]  cholecalciferol (VITAMIN D) 1000 UNITS tablet Take 1,000 Units by mouth at bedtime.     [provider]  clopidogrel (PLAVIX) 75 MG tablet Take 1 tablet (75 mg total) by mouth daily. 06/25/20   Sueanne Margarita, MD   Cyanocobalamin (B-12 PO) Take 1,000 mcg by mouth daily.     [provider]  doxazosin (CARDURA) 2 MG tablet TAKE ONE TABLET BY MOUTH DAILY 03/19/21   Sueanne Margarita, MD  ezetimibe (ZETIA) 10 MG tablet Take 1 tablet (10 mg total) by mouth daily. 06/25/20   Sueanne Margarita, MD  folic acid (FOLVITE) 213 MCG tablet Take 400 mcg by mouth daily.    [provider]  Multiple Vitamin (MULTIVITAMIN) tablet Take 1 tablet by mouth daily.    [provider]  nitroGLYCERIN (NITROSTAT) 0.4 MG SL tablet Place 1 tablet (0.4 mg total) under the tongue every 5 (five) minutes as needed for chest pain. 01/27/17   Bhagat, Crista Luria, PA  rosuvastatin (CRESTOR) 40 MG tablet TAKE ONE TABLET BY MOUTH EVERY NIGHT AT BEDTIME 03/19/21   Turner, Eber Hong,  MD  sacubitril-valsartan (ENTRESTO) 24-26 MG Take 1 tablet by mouth 2 (two) times daily. 04/25/21   Sueanne Margarita, MD  sertraline (ZOLOFT) 50 MG tablet Take 50 mg by mouth in the morning and at bedtime.     [provider]  sotorasib (LUMAKRAS) 120 MG TABS Take 8 tablets (960 mg) by mouth daily after breakfast. 04/28/21   [name], MD  spironolactone (ALDACTONE) 25 MG tablet TAKE ONE TABLET BY MOUTH DAILY 06/25/20   Sueanne Margarita, MD  traMADol (ULTRAM) 50 MG tablet Take 1 tablet (50 mg total) by mouth every 6 (six) hours as needed. 04/28/21   [name], MD  prochlorperazine (COMPAZINE) 10 MG tablet Take 1 tablet (10 mg total) by mouth every 6 (six) hours as needed (Nausea or vomiting). 06/24/20 04/25/21  [name], MD    Physical Exam: Vitals:   05/04/21 1639 05/04/21 1830 05/04/21 1845 05/04/21 1930  BP: 107/71 105/71 114/67 118/70  Pulse: (!) 111 99 97 (!) 113  Resp: (!) 24 (!) 24 (!) 22 (!) 23  Temp: 98.8 F (37.1 C)     TempSrc: Oral     SpO2: 100% 92% 100% (!) 89%    Constitutional: NAD Eyes: PERRL, lids and conjunctivae normal ENMT: Mucous membranes are moist. Posterior pharynx clear of any exudate or  lesions.Normal dentition.  Neck: normal, supple, no masses, no thyromegaly Respiratory: Mild tachypnea, Normal respiratory effort. No accessory muscle use.  Cardiovascular: mild tachycardia, no obvious LE swelling. Abdomen: no tenderness, no masses palpated. No hepatosplenomegaly. Bowel sounds positive.  Musculoskeletal: no clubbing / cyanosis. No joint deformity upper and lower extremities. Good ROM, no contractures. Normal muscle tone.  Skin: no rashes, lesions, ulcers. No induration Neurologic: CN 2-12 grossly intact. Sensation intact, DTR normal. Strength 5/5 in all 4.  Psychiatric: Alert and oriented x 3. Maybe mild confusion?   Labs on Admission: I have personally reviewed following labs and imaging studies  CBC: Recent Labs  Lab 05/04/21 1646  WBC 9.1  NEUTROABS 7.4  HGB 11.7*  HCT 36.6*  MCV 96.1  PLT 979   Basic Metabolic Panel: Recent Labs  Lab 05/04/21 1646  NA 135  K 4.1  CL 100  CO2 24  GLUCOSE 109*  BUN 20  CREATININE 0.93  CALCIUM 9.5   GFR: Estimated Creatinine Clearance: 81.7 mL/min (by C-G formula based on SCr of 0.93 mg/dL). Liver Function Tests: Recent Labs  Lab 05/04/21 1646  AST 20  ALT 19  ALKPHOS 80  BILITOT 0.7  PROT 7.8  ALBUMIN 3.4*   No results for input(s): LIPASE, AMYLASE in the last 168 hours. Recent Labs  Lab 05/04/21 1646  AMMONIA <10   Coagulation Profile: No results for input(s): INR, PROTIME in the last 168 hours. Cardiac Enzymes: No results for input(s): CKTOTAL, CKMB, CKMBINDEX, TROPONINI in the last 168 hours. BNP (last 3 results) No results for input(s): PROBNP in the last 8760 hours. HbA1C: No results for input(s): HGBA1C in the last 72 hours. CBG: No results for input(s): GLUCAP in the last 168 hours. Lipid Profile: No results for input(s): CHOL, HDL, LDLCALC, TRIG, CHOLHDL, LDLDIRECT in the last 72 hours. Thyroid Function Tests: No results for input(s): TSH, T4TOTAL, FREET4, T3FREE, THYROIDAB in the last  72 hours. Anemia Panel: No results for input(s): VITAMINB12, FOLATE, FERRITIN, TIBC, IRON, RETICCTPCT in the last 72 hours. Urine analysis:    Component Value Date/Time   COLORURINE YELLOW 05/21/2020 [place] 05/21/2020  [place] 1.012 05/21/2020 1455   PHURINE 6.0 05/21/2020 [place] 05/21/2020 1455   HGBUR NEGATIVE 05/21/2020 1455   BILIRUBINUR NEGATIVE 05/21/2020 [place] 05/21/2020 1455   PROTEINUR NEGATIVE 05/21/2020 1455   NITRITE NEGATIVE 05/21/2020 1455   LEUKOCYTESUR NEGATIVE 05/21/2020 1455    Radiological Exams on Admission: DG Chest 2 View  Result Date: 05/04/2021 CLINICAL DATA:  Chest pain EXAM: CHEST - 2 VIEW COMPARISON:  01/10/2021 FINDINGS: Blunting of left costophrenic angle, likely small effusion. Scarring in the left lung base. Right base atelectasis. Heart is normal size. Prior CABG. No acute bony abnormality. IMPRESSION: Small left pleural effusion. Left basilar scarring and right base atelectasis. Electronically Signed   By: Rolm Baptise M.D.   On: 05/04/2021 17:16   CT HEAD WO CONTRAST (5MM)  Result Date: 05/04/2021 CLINICAL DATA:  Altered mental status EXAM: CT HEAD WITHOUT CONTRAST TECHNIQUE: Contiguous axial images were obtained from the base of the skull through the vertex without intravenous contrast. COMPARISON:  None. FINDINGS: Brain: Mild age related volume loss. No acute intracranial abnormality. Specifically, no hemorrhage, hydrocephalus, mass lesion, acute infarction, or significant intracranial injury. Vascular: No hyperdense vessel or unexpected calcification. Skull: No acute calvarial abnormality. Sinuses/Orbits: No acute findings Other: None IMPRESSION: No acute intracranial abnormality. Electronically Signed   By: Rolm Baptise M.D.   On: 05/04/2021 19:23   CT Angio Chest PE W and/or Wo Contrast  Result Date: 05/04/2021 CLINICAL DATA:  History of lung carcinoma with chest pain, initial  encounter EXAM: CT ANGIOGRAPHY CHEST WITH CONTRAST TECHNIQUE: Multidetector CT imaging of the chest was performed using the standard protocol during bolus administration of intravenous contrast. Multiplanar CT image reconstructions and MIPs were obtained to evaluate the vascular anatomy. CONTRAST:  63mL OMNIPAQUE IOHEXOL 350 MG/ML SOLN COMPARISON:  04/23/2021 PET-CT FINDINGS: Cardiovascular: Atherosclerotic calcifications of the thoracic aorta are noted without aneurysmal dilatation or dissection. Changes of prior coronary bypass grafting are seen. Mild cardiac enlargement is noted. Coronary calcifications are identified as well. Pulmonary artery is well visualized with evidence of filling defect within right lower and middle lobe branches consistent with pulmonary embolus. No right heart strain is noted. No pericardial effusion is seen. Mediastinum/Nodes: Thoracic inlet is within normal limits. No sizable hilar or mediastinal adenopathy is noted. Small stable lymph nodes are seen. Calcified lymph nodes are noted within the right hila and subcarinal region consistent with prior granulomatous disease. The esophagus as visualized is within normal limits. Lungs/Pleura: Changes of prior left upper lobectomy are noted with volume loss identified. Some scarring is noted in the left lower lobe. There remains a pleural based density stable in appearance from the prior PET-CT which was mildly hypermetabolic on prior exam. Right lung shows stable nodules in the right upper lobe as well as within the right lower lobe similar to that seen on prior CT examination. Calcified granulomas are noted as well. Some mild focal infiltrate is seen within the right middle lobe likely sequelae from the pulmonary emboli. A few scattered smaller nodules are noted also stable in appearance from the prior CT. Upper Abdomen: Visualized upper abdomen again shows a large right adrenal mass similar to that seen on prior PET-CT. Known left adrenal  mass is not well visualized on this exam.No other upper abdominal abnormality is seen. Musculoskeletal: Degenerative changes of the thoracic spine are noted. Lytic lesion is noted in the left half of the T1 vertebra similar to that seen on prior PET-CT.  Pathologic rib fracture is noted in the right fourth rib laterally which corresponds to a hypermetabolic lesion seen on prior PET-CT. No other definitive rib fractures are seen. The known T10 vertebral body lesion is again seen and stable from the prior PET-CT. Review of the MIP images confirms the above findings. IMPRESSION: Findings consistent with pulmonary embolism involving the right lower lobe and right middle lobe branches. No right heart strain is noted. Some associated new airspace opacity is noted in the right middle lobe likely related to the underlying embolus. No definitive infarct is noted. Changes consistent with prior left upper lobectomy consistent with the given clinical history. Scattered pulmonary nodules are noted unchanged from recent PET-CT from 04/23/2021. Multiple bony metastatic lesions with a pathologic fracture involving the right fourth rib laterally. Stable appearing large right adrenal lesion consistent with metastatic disease. Aortic Atherosclerosis (ICD10-I70.0). Critical Value/emergent results were called by telephone at the time of interpretation on 05/04/2021 at 7:34 pm to [facility] , who verbally acknowledged these results. Electronically Signed   By: Inez Catalina M.D.   On: 05/04/2021 19:35    EKG: Independently reviewed.  Assessment/Plan Principal Problem:   Acute pulmonary embolism (HCC) Active Problems:   Essential hypertension, benign   DCM (dilated cardiomyopathy) (HCC)   S/P partial lobectomy of lung   Non-small cell carcinoma of lung, stage 4, left (HCC)    Acute PE - No RHS on CT scan, though has mild tachycardia and tachypnea Hemodynamically stable at the moment but am concerned that pt very high  risk for further PEs and death due to setting of PEs in metastatic CA Have sent message to PCCM for consult tonight vs AM (their choice). Heparin gtt Tele monitor Cont pulse ox 2d echo US BLE Check ABG Morphine PRN pain NSCLC - stage 4 With new metastatic disease seen on PET earlier this month MRI yesterday not read yet (though I dont see anything obvious on my 'non-official non-radiologist' read of the brain). Do think I see what looks like Bony mets to vertebrae in spine, though these are already known from the PET scan 10 days prior. Sent epic secure chat to oncology to let them know about pt admission to [facility] for PE HTN - Med rec pending But planning on holding home BP meds for the moment in setting of acute PEs DCM - Holding diuretics for the moment CAD - Cont ASA Cont Statin Holding plavix (since pt now going to be on [facility]) Looks like last PCI was 2018  DVT prophylaxis: Heparin gtt Code Status: Full Family Communication: Wife at bedside Disposition Plan: Home after PE treatment and work up [employer] called: PCCM as above Admission status: Admit to inpatient  Severity of Illness: The appropriate patient status for this patient is INPATIENT. Inpatient status is judged to be reasonable and necessary in order to provide the required intensity of service to ensure the patient's safety. The patient's presenting symptoms, physical exam findings, and initial radiographic and laboratory data in the context of their chronic comorbidities is felt to place them at high risk for further clinical deterioration. Furthermore, it is not anticipated that the patient will be medically stable for discharge from the hospital within 2 midnights of admission.   * I certify that at the point of admission it is my clinical judgment that the patient will require inpatient hospital care spanning beyond 2 midnights from the point of admission due to high intensity of service, high risk for further  deterioration and high  frequency of surveillance required.*   Kechia Yahnke M. DO [facility]  How to contact the [facility] Attending or Consulting provider [place] or covering provider during after hours [place], for this patient?  Check the care team in [facility] and look for a) attending/consulting [facility] provider listed and b) the [facility] team listed Log into [email or web address]  Amion Physician Scheduling and messaging for groups and whole hospitals  On call and physician scheduling software for group practices, residents, hospitalists and other medical providers for call, clinic, rotation and shift schedules. On[employer] is a hospital-wide system for scheduling doctors and paging doctors on call. EasyPlot is for scientific plotting and data analysis.  [email or web address]  and use [place]'s universal password to access. If you do not have the password, please contact the hospital operator.  Locate the [facility] provider you are looking for under [facility] and page to a number that you can be directly reached. If you still have difficulty reaching the provider, please page the [facility] (Director on Call) for the Hospitalists listed on amion for assistance.  05/04/2021, 8:31 PM

## 2021-05-04 NOTE — ED Notes (Signed)
Received verbal report from [place] at this time

## 2021-05-05 ENCOUNTER — Telehealth: Payer: Self-pay | Admitting: Cardiology

## 2021-05-05 ENCOUNTER — Other Ambulatory Visit: Payer: Medicare Other

## 2021-05-05 ENCOUNTER — Telehealth: Payer: Self-pay | Admitting: *Deleted

## 2021-05-05 ENCOUNTER — Inpatient Hospital Stay (HOSPITAL_COMMUNITY): Payer: Medicare Other

## 2021-05-05 ENCOUNTER — Encounter (HOSPITAL_COMMUNITY): Payer: Self-pay | Admitting: Internal Medicine

## 2021-05-05 DIAGNOSIS — C3492 Malignant neoplasm of unspecified part of left bronchus or lung: Secondary | ICD-10-CM

## 2021-05-05 DIAGNOSIS — I2699 Other pulmonary embolism without acute cor pulmonale: Secondary | ICD-10-CM

## 2021-05-05 DIAGNOSIS — R079 Chest pain, unspecified: Secondary | ICD-10-CM

## 2021-05-05 LAB — ECHOCARDIOGRAM COMPLETE
AR max vel: 2.87 cm2
AV Area VTI: 2.9 cm2
AV Area mean vel: 2.43 cm2
AV Mean grad: 5 mmHg
AV Peak grad: 8.5 mmHg
Ao pk vel: 1.46 m/s
Area-P 1/2: 3.24 cm2
Height: 71 in
MV VTI: 2.98 cm2
S' Lateral: 3.7 cm
Weight: 3200 oz

## 2021-05-05 LAB — CBC
HCT: 30.9 % — ABNORMAL LOW (ref 39.0–52.0)
Hemoglobin: 9.8 g/dL — ABNORMAL LOW (ref 13.0–17.0)
MCH: 30.2 pg (ref 26.0–34.0)
MCHC: 31.7 g/dL (ref 30.0–36.0)
MCV: 95.1 fL (ref 80.0–100.0)
Platelets: 261 10*3/uL (ref 150–400)
RBC: 3.25 MIL/uL — ABNORMAL LOW (ref 4.22–5.81)
RDW: 14.5 % (ref 11.5–15.5)
WBC: 8.4 10*3/uL (ref 4.0–10.5)
nRBC: 0 % (ref 0.0–0.2)

## 2021-05-05 LAB — BASIC METABOLIC PANEL
Anion gap: 10 (ref 5–15)
BUN: 24 mg/dL — ABNORMAL HIGH (ref 8–23)
CO2: 22 mmol/L (ref 22–32)
Calcium: 8.6 mg/dL — ABNORMAL LOW (ref 8.9–10.3)
Chloride: 100 mmol/L (ref 98–111)
Creatinine, Ser: 1 mg/dL (ref 0.61–1.24)
GFR, Estimated: >60 mL/min (ref >60)
Glucose, Bld: 99 mg/dL (ref 70–99)
Potassium: 4.1 mmol/L (ref 3.5–5.1)
Sodium: 132 mmol/L — ABNORMAL LOW (ref 135–145)

## 2021-05-05 LAB — HEPARIN LEVEL (UNFRACTIONATED)
Heparin Unfractionated: <0.1 IU/mL — ABNORMAL LOW (ref 0.30–0.70)
Heparin Unfractionated: 0.19 IU/mL — ABNORMAL LOW (ref 0.30–0.70)

## 2021-05-05 MED ORDER — SOTORASIB 120 MG PO TABS
960.0000 mg | ORAL_TABLET | Freq: Every day | ORAL | Status: DC
  Administered 2021-05-06: 960 mg via ORAL
  Filled 2021-05-05 (×2): qty 8

## 2021-05-05 MED ORDER — HEPARIN BOLUS VIA INFUSION
3000.0000 [IU] | Freq: Once | INTRAVENOUS | Status: AC
  Administered 2021-05-05: 3000 [IU] via INTRAVENOUS
  Filled 2021-05-05: qty 3000

## 2021-05-05 NOTE — Progress Notes (Signed)
  Echocardiogram 2D Echocardiogram has been performed.  John Hinton 05/05/2021, 11:14 AM

## 2021-05-05 NOTE — ED Notes (Signed)
Pt placed back in bed at this time

## 2021-05-05 NOTE — Telephone Encounter (Signed)
[place] Representative called regarding ICD code to aid pt in completing patient assistance forms for [employer].  RNCM reviewed chart and provided ICD code listed: Non-small cell carcinoma of lung, stage 4, left ([place]) (Chronic) C34.92    RNCM also provided prescribing MD office and fax information as requested by Rep.

## 2021-05-05 NOTE — ED Notes (Signed)
Pt is requesting to sit up on the side of the bed. Pt assisted to sitting position and advised to not stand up without anyone in the room. Pt verbalized understanding

## 2021-05-05 NOTE — Progress Notes (Signed)
ANTICOAGULATION CONSULT NOTE  Pharmacy Consult for heparin Indication: pulmonary embolus  Allergies  Allergen Reactions   Amlodipine Swelling    Swelling of ankles    Patient Measurements: Height: 5\' 11"  (180.3 cm) Weight: 90.7 kg (200 lb) IBW/kg (Calculated) : 75.3 Heparin Dosing Weight: TBW  Vital Signs: BP: 111/71 (10/17 1300) Pulse Rate: 78 (10/17 1300)  Labs: Recent Labs    05/04/21 1646 05/04/21 1935 05/04/21 2130 05/05/21 0438 05/05/21 1626  HGB 11.7*  --  11.9* 9.8*  --   HCT 36.6*  --  35.0* 30.9*  --   PLT 267  --   --  261  --   HEPARINUNFRC  --   --   --  <0.10* 0.19*  CREATININE 0.93  --   --  1.00  --   TROPONINIHS 13 14  --   --   --      Estimated Creatinine Clearance: 75.8 mL/min (by C-G formula based on SCr of 1 mg/dL).   Medical History: Past Medical History:  Diagnosis Date   BPH (benign prostatic hypertrophy)    Bradycardia    a. H/o asymptomatic bradycardia.   Coronary artery disease    a. s/p CABG ~2007 with LIMA to LAD, SVG seq to OM1 and OM2, SVG to PDA and SVG to diag. b. Abnormal nuc 03/2015 - s/p DES to SVG-OM1-OM2. 01/29/17 POBA with cutting balloon to SVG-->OM.  No ischemia Lexi myoview 2021   DCM (dilated cardiomyopathy) ([place]) 09/17/2015   mixed by cMRI 03/2021 with EF 39% with non viable infarct in basal to mid lateral wall and LGE pattern c/w nonischemic component as well. Mild RV dysfunction RVEF 42%.   Dyslipidemia    Dyspnea    r/t lung mass   ED (erectile dysfunction)    Fear of heights    Goals of care, counseling/discussion 06/19/2020   Hypertension    Non-small cell carcinoma of lung, stage 4, left ([place]) 06/19/2020   OSA (obstructive sleep apnea) 07/12/2015   Moderate OSA with AHI 16.5/hr now on CPAP at 8cm H2O, uses cpap nightly      RBBB    Shoulder, capsulitis, adhesive    Left Shoulder     Assessment: 42 YOM presenting with SOB and CP x 2-3d, CT shows PE in right lower lobe and right middle lobe, no RHS.  He  is not on anticoagulation PTA, chronic anemia stable, plts 267.  Heparin level 0.19 on gtt at 1750 units/hr. No issues with line or bleeding noted.  Goal of Therapy:  Heparin level 0.3-0.7 units/ml Monitor platelets by anticoagulation protocol: Yes   Plan:  Rebolus heparin 3000 units  Increase heparin infusion to 2000 units/hr F/u 8 hour heparin level  Lorelei Pont, PharmD, BCPS 05/05/2021 5:55 PM ED Clinical Pharmacist -  [phone]

## 2021-05-05 NOTE — Consult Note (Addendum)
[place]  Telephone:([phone]) [phone] Fax:([phone]) [place]  Reason for Consultation: Stage IVa (T3 N2 M0) adenocarcinoma of the left upper lobe-resected-KRAS positive-progression, acute pulmonary emboli  HPI: Mr. Kussman is a 75 year old male with a past medical history significant for CAD, stage IV non-small cell lung cancer status post left upper lobectomy, diastolic heart failure.  Last visit at the cancer center was on 04/25/2021 and the recommendation was made for him to begin Lumakras.  He started this on 05/02/2021. He presented to the emergency department with chest pain, shortness of breath, confusion.  On admission, CTA of the chest showed findings consistent with pulmonary embolism involving the right lower lobe and right middle lobe branches, no heart strain, multiple bony metastatic lesions with a pathologic fracture involving the right fourth rib laterally, stable appearing large right adrenal lesion consistent with metastatic disease.  The patient has been started on a heparin drip.  He has no evidence of lower extremity DVT.  A 2D echo is pending.  Also of note, the patient had recent outpatient MRI of the brain, lumbar spine, and thoracic spine which have not yet been read.  I met with the patient and his wife in the emergency department.  He reports he still has some right-sided chest discomfort.  However, pain is much better than prior to admission.  He also reports improvement in his shortness of breath.  His wife reports that his confusion has improved.  He is not having any headaches or dizziness.  He denies abdominal pain, nausea, vomiting.  He reports that he has not had a bowel movement in about 3 days.  No bleeding reported.  Medical oncology was asked see the patient make recommendations regarding his lung cancer and acute PE.  Past Medical History:  Diagnosis Date   BPH (benign prostatic hypertrophy)    Bradycardia    a. H/o  asymptomatic bradycardia.   Coronary artery disease    a. s/p CABG ~2007 with LIMA to LAD, SVG seq to OM1 and OM2, SVG to PDA and SVG to diag. b. Abnormal nuc 03/2015 - s/p DES to SVG-OM1-OM2. 01/29/17 POBA with cutting balloon to SVG-->OM.  No ischemia Lexi myoview 2021   DCM (dilated cardiomyopathy) ([place]) 09/17/2015   mixed by cMRI 03/2021 with EF 39% with non viable infarct in basal to mid lateral wall and LGE pattern c/w nonischemic component as well. Mild RV dysfunction RVEF 42%.   Dyslipidemia    Dyspnea    r/t lung mass   ED (erectile dysfunction)    Fear of heights    Goals of care, counseling/discussion 06/19/2020   Hypertension    Non-small cell carcinoma of lung, stage 4, left ([place]) 06/19/2020   OSA (obstructive sleep apnea) 07/12/2015   Moderate OSA with AHI 16.5/hr now on CPAP at 8cm H2O, uses cpap nightly      RBBB    Shoulder, capsulitis, adhesive    Left Shoulder  :   Past Surgical History:  Procedure Laterality Date   APPENDECTOMY     BRONCHIAL BIOPSY  05/09/2020   Procedure: BRONCHIAL BIOPSIES;  Surgeon: [name], DO;  Location: [facility];  Service: Pulmonary;;   BRONCHIAL BRUSHINGS  05/09/2020   Procedure: BRONCHIAL BRUSHINGS;  Surgeon: [name], DO;  Location: [facility];  Service: Pulmonary;;   BRONCHIAL NEEDLE ASPIRATION BIOPSY  05/09/2020   Procedure: BRONCHIAL NEEDLE ASPIRATION BIOPSIES;  Surgeon: [name], DO;  Location: [facility];  Service:  Pulmonary;;   BRONCHIAL WASHINGS  05/09/2020   Procedure: BRONCHIAL WASHINGS;  Surgeon: [name], DO;  Location: [facility];  Service: Pulmonary;;   CARDIAC CATHETERIZATION N/A 04/05/2015   Procedure: Left Heart Cath and Coronary Angiography;  Surgeon: Sherren Mocha, MD;  Location: [facility];  Service: Cardiovascular;  Laterality: N/A;   COLONOSCOPY     CORONARY ARTERY BYPASS GRAFT     w/LIMA to LAD, seq SVG to OM1 and OM2, SVG to PDA and SVG to Diagonal   CORONARY  BALLOON ANGIOPLASTY N/A 01/29/2017   Procedure: Coronary Balloon Angioplasty;  Surgeon: Martinique, Amdrew Oboyle M, MD;  Location: [facility];  Service: Cardiovascular;  Laterality: N/A;   FIDUCIAL MARKER PLACEMENT  05/09/2020   Procedure: FIDUCIAL MARKER PLACEMENT;  Surgeon: [name], DO;  Location: [facility];  Service: Pulmonary;;   INTERCOSTAL NERVE BLOCK Left 05/22/2020   Procedure: INTERCOSTAL NERVE BLOCK;  Surgeon: Lajuana Matte, MD;  Location: [facility];  Service: Thoracic;  Laterality: Left;   LEFT HEART CATH AND CORS/GRAFTS ANGIOGRAPHY N/A 01/29/2017   Procedure: Left Heart Cath and Cors/Grafts Angiography;  Surgeon: Martinique, Dezzie Badilla M, MD;  Location: [facility];  Service: Cardiovascular;  Laterality: N/A;   NODE DISSECTION N/A 05/22/2020   Procedure: NODE DISSECTION;  Surgeon: Lajuana Matte, MD;  Location: [facility];  Service: Thoracic;  Laterality: N/A;   VIDEO BRONCHOSCOPY WITH ENDOBRONCHIAL NAVIGATION N/A 05/09/2020   Procedure: VIDEO BRONCHOSCOPY WITH ENDOBRONCHIAL NAVIGATION;  Surgeon: [name], DO;  Location: [facility];  Service: Pulmonary;  Laterality: N/A;   VIDEO BRONCHOSCOPY WITH ENDOBRONCHIAL ULTRASOUND N/A 05/09/2020   Procedure: VIDEO BRONCHOSCOPY WITH ENDOBRONCHIAL ULTRASOUND;  Surgeon: [name], DO;  Location: [facility];  Service: Pulmonary;  Laterality: N/A;  :   Current Facility-Administered Medications  Medication Dose Route Frequency Provider Last Rate Last Admin   acetaminophen (TYLENOL) tablet 650 mg  650 mg Oral Q6H PRN Etta Quill, DO       Or   acetaminophen (TYLENOL) suppository 650 mg  650 mg Rectal Q6H PRN Etta Quill, DO       aspirin EC tablet 81 mg  81 mg Oral QHS Jennette Kettle M, DO   81 mg at 05/04/21 2200   ezetimibe (ZETIA) tablet 10 mg  10 mg Oral QHS Jennette Kettle M, DO   10 mg at 36/12/24 4975   folic acid (FOLVITE) tablet 0.5 mg  500 mcg Oral Daily Jennette Kettle M, DO   0.5 mg at 05/05/21 1123    heparin ADULT infusion 100 units/mL (25000 units/224m)  1,750 Units/hr Intravenous Continuous AFranky Macho RPH 17.5 mL/hr at 05/05/21 0625 1,750 Units/hr at 05/05/21 03005  morphine 2 MG/ML injection 2-4 mg  2-4 mg Intravenous Q4H PRN GEtta Quill DO   4 mg at 05/05/21 01102  multivitamin with minerals tablet 1 tablet  1 tablet Oral Daily GEtta Quill DO   1 tablet at 05/05/21 1123   ondansetron (ZOFRAN) tablet 4 mg  4 mg Oral Q6H PRN GEtta Quill DO       Or   ondansetron ([facility] injection 4 mg  4 mg Intravenous Q6H PRN GEtta Quill DO       rosuvastatin (CRESTOR) tablet 40 mg  40 mg Oral QHS GJennette KettleM, DO   40 mg at 05/04/21 2204   sertraline (ZOLOFT) tablet 50 mg  50 mg Oral BID GEtta Quill DO  50 mg at 05/05/21 1124   sotorasib TABS 960 mg  960 mg Oral QPC breakfast Regalado, Belkys A, MD       Current Outpatient Medications  Medication Sig Dispense Refill   acetaminophen (TYLENOL) 500 MG tablet Take 1,000 mg by mouth every 6 (six) hours as needed for moderate pain or headache.     aspirin 81 MG tablet Take 81 mg by mouth at bedtime.      cholecalciferol (VITAMIN D) 1000 UNITS tablet Take 1,000 Units by mouth at bedtime.      clopidogrel (PLAVIX) 75 MG tablet Take 1 tablet (75 mg total) by mouth daily. 90 tablet 3   Cyanocobalamin (B-12 PO) Take 1,000 mcg by mouth daily.      doxazosin (CARDURA) 2 MG tablet TAKE ONE TABLET BY MOUTH DAILY (Patient taking differently: Take 2 mg by mouth daily.) 90 tablet 0   ezetimibe (ZETIA) 10 MG tablet Take 1 tablet (10 mg total) by mouth daily. (Patient taking differently: Take 10 mg by mouth at bedtime.) 90 tablet 3   folic acid (FOLVITE) 253 MCG tablet Take 400 mcg by mouth daily.     ibuprofen (ADVIL) 200 MG tablet Take 400 mg by mouth every 6 (six) hours as needed for headache or moderate pain.     Multiple Vitamin (MULTIVITAMIN) tablet Take 1 tablet by mouth daily.     nitroGLYCERIN (NITROSTAT) 0.4 MG SL tablet  Place 1 tablet (0.4 mg total) under the tongue every 5 (five) minutes as needed for chest pain. 25 tablet 3   rosuvastatin (CRESTOR) 40 MG tablet TAKE ONE TABLET BY MOUTH EVERY NIGHT AT BEDTIME (Patient taking differently: Take 40 mg by mouth at bedtime.) 90 tablet 0   sacubitril-valsartan (ENTRESTO) 24-26 MG Take 1 tablet by mouth 2 (two) times daily. 60 tablet 11   sertraline (ZOLOFT) 50 MG tablet Take 50 mg by mouth in the morning and at bedtime.      sotorasib (LUMAKRAS) 120 MG TABS Take 8 tablets (960 mg) by mouth daily after breakfast. 240 tablet 5   spironolactone (ALDACTONE) 25 MG tablet TAKE ONE TABLET BY MOUTH DAILY (Patient taking differently: Take 25 mg by mouth daily.) 90 tablet 3   traMADol (ULTRAM) 50 MG tablet Take 1 tablet (50 mg total) by mouth every 6 (six) hours as needed. (Patient taking differently: Take 50 mg by mouth every 6 (six) hours as needed for moderate pain.) 90 tablet 0      Allergies  Allergen Reactions   Amlodipine Swelling    Swelling of ankles  :   Family History  Problem Relation Age of Onset   Lung cancer Mother    Lung cancer Father    Heart disease Father    Heart failure Father    Cancer Sister   :   Social History   Socioeconomic History   Marital status: Married    Spouse name: Not on file   Number of children: Not on file   Years of education: Not on file   Highest education level: Not on file  Occupational History   Not on file  Tobacco Use   Smoking status: Former    Types: Cigarettes    Quit date: 07/21/1983    Years since quitting: 37.8   Smokeless tobacco: Former  Scientific laboratory technician Use: Never used  Substance and Sexual Activity   Alcohol use: Yes    Alcohol/week: 20.0 - 28.0 standard drinks    Types: 20 -  28 Standard drinks or equivalent per week    Comment: daily - 20-28 /week   Drug use: No   Sexual activity: Not Currently  Other Topics Concern   Not on file  Social History Narrative   Not on file   Social  Determinants of Health   Financial Resource Strain: Not on file  Food Insecurity: Not on file  Transportation Needs: Not on file  Physical Activity: Not on file  Stress: Not on file  Social Connections: Not on file  Intimate Partner Violence: Not on file  :  Review of Systems: A comprehensive 14 point review of systems was negative except as noted in the HPI.  Exam: Patient Vitals for the past 24 hrs:  BP Temp Temp src Pulse Resp SpO2 Height Weight  05/05/21 0900 104/74 -- -- 60 20 95 % -- --  05/05/21 0830 102/78 -- -- 85 (!) 24 95 % -- --  05/05/21 0800 109/66 -- -- 72 (!) 27 92 % -- --  05/05/21 0730 109/71 -- -- 86 (!) 22 94 % -- --  05/05/21 0645 118/70 -- -- 91 (!) 27 95 % -- --  05/05/21 0615 115/74 -- -- 79 (!) 27 94 % -- --  05/05/21 0515 98/64 -- -- 79 (!) 26 93 % -- --  05/05/21 0500 101/66 -- -- 80 (!) 25 94 % -- --  05/05/21 0400 110/66 -- -- 94 16 92 % -- --  05/05/21 0345 102/65 -- -- 81 (!) 26 94 % -- --  05/05/21 0315 101/67 -- -- 78 (!) 21 93 % -- --  05/05/21 0245 111/66 -- -- 78 (!) 28 94 % -- --  05/05/21 0215 97/74 -- -- 85 (!) 22 96 % -- --  05/05/21 0115 101/73 -- -- 81 (!) 27 94 % -- --  05/04/21 2330 121/68 -- -- 91 (!) 26 94 % -- --  05/04/21 2210 92/62 -- -- 91 (!) 24 94 % -- --  05/04/21 2209 -- -- -- -- -- -- '5\' 11"'  (1.803 m) 90.7 kg  05/04/21 1930 118/70 -- -- (!) 113 (!) 23 (!) 89 % -- --  05/04/21 1845 114/67 -- -- 97 (!) 22 100 % -- --  05/04/21 1830 105/71 -- -- 99 (!) 24 92 % -- --  05/04/21 1639 107/71 98.8 F (37.1 C) Oral (!) 111 (!) 24 100 % -- --    General:  well-nourished in no acute distress.   Eyes:  no scleral icterus.   ENT:  There were no oropharyngeal lesions.   Respiratory: lungs were clear bilaterally without wheezing or crackles.   Cardiovascular:  Regular rate and rhythm, S1/S2, without murmur, rub or gallop.  There was no pedal edema.   GI:  abdomen was soft, flat, nontender, nondistended, without organomegaly.      Skin exam was without echymosis, petichae.   Neuro exam was nonfocal. Patient was alert and oriented.  Attention was good.   Language was appropriate.  Mood was normal without depression.  Speech was not pressured.  Thought content was not tangential.     Lab Results  Component Value Date   WBC 8.4 05/05/2021   HGB 9.8 (L) 05/05/2021   HCT 30.9 (L) 05/05/2021   PLT 261 05/05/2021   GLUCOSE 99 05/05/2021   CHOL 145 04/08/2021   TRIG 112 04/08/2021   HDL 44 04/08/2021   LDLCALC 81 04/08/2021   ALT 19 05/04/2021   AST 20 05/04/2021  NA 132 (L) 05/05/2021   K 4.1 05/05/2021   CL 100 05/05/2021   CREATININE 1.00 05/05/2021   BUN 24 (H) 05/05/2021   CO2 22 05/05/2021    DG Chest 2 View  Result Date: 05/04/2021 CLINICAL DATA:  Chest pain EXAM: CHEST - 2 VIEW COMPARISON:  01/10/2021 FINDINGS: Blunting of left costophrenic angle, likely small effusion. Scarring in the left lung base. Right base atelectasis. Heart is normal size. Prior CABG. No acute bony abnormality. IMPRESSION: Small left pleural effusion. Left basilar scarring and right base atelectasis. Electronically Signed   By: Rolm Baptise M.D.   On: 05/04/2021 17:16   CT HEAD WO CONTRAST (5MM)  Result Date: 05/04/2021 CLINICAL DATA:  Altered mental status EXAM: CT HEAD WITHOUT CONTRAST TECHNIQUE: Contiguous axial images were obtained from the base of the skull through the vertex without intravenous contrast. COMPARISON:  None. FINDINGS: Brain: Mild age related volume loss. No acute intracranial abnormality. Specifically, no hemorrhage, hydrocephalus, mass lesion, acute infarction, or significant intracranial injury. Vascular: No hyperdense vessel or unexpected calcification. Skull: No acute calvarial abnormality. Sinuses/Orbits: No acute findings Other: None IMPRESSION: No acute intracranial abnormality. Electronically Signed   By: Rolm Baptise M.D.   On: 05/04/2021 19:23   CT Angio Chest PE W and/or Wo Contrast  Result Date:  05/04/2021 CLINICAL DATA:  History of lung carcinoma with chest pain, initial encounter EXAM: CT ANGIOGRAPHY CHEST WITH CONTRAST TECHNIQUE: Multidetector CT imaging of the chest was performed using the standard protocol during bolus administration of intravenous contrast. Multiplanar CT image reconstructions and MIPs were obtained to evaluate the vascular anatomy. CONTRAST:  54m OMNIPAQUE IOHEXOL 350 MG/ML SOLN COMPARISON:  04/23/2021 PET-CT FINDINGS: Cardiovascular: Atherosclerotic calcifications of the thoracic aorta are noted without aneurysmal dilatation or dissection. Changes of prior coronary bypass grafting are seen. Mild cardiac enlargement is noted. Coronary calcifications are identified as well. Pulmonary artery is well visualized with evidence of filling defect within right lower and middle lobe branches consistent with pulmonary embolus. No right heart strain is noted. No pericardial effusion is seen. Mediastinum/Nodes: Thoracic inlet is within normal limits. No sizable hilar or mediastinal adenopathy is noted. Small stable lymph nodes are seen. Calcified lymph nodes are noted within the right hila and subcarinal region consistent with prior granulomatous disease. The esophagus as visualized is within normal limits. Lungs/Pleura: Changes of prior left upper lobectomy are noted with volume loss identified. Some scarring is noted in the left lower lobe. There remains a pleural based density stable in appearance from the prior PET-CT which was mildly hypermetabolic on prior exam. Right lung shows stable nodules in the right upper lobe as well as within the right lower lobe similar to that seen on prior CT examination. Calcified granulomas are noted as well. Some mild focal infiltrate is seen within the right middle lobe likely sequelae from the pulmonary emboli. A few scattered smaller nodules are noted also stable in appearance from the prior CT. Upper Abdomen: Visualized upper abdomen again shows a  large right adrenal mass similar to that seen on prior PET-CT. Known left adrenal mass is not well visualized on this exam.No other upper abdominal abnormality is seen. Musculoskeletal: Degenerative changes of the thoracic spine are noted. Lytic lesion is noted in the left half of the T1 vertebra similar to that seen on prior PET-CT. Pathologic rib fracture is noted in the right fourth rib laterally which corresponds to a hypermetabolic lesion seen on prior PET-CT. No other definitive rib fractures are seen. The  known T10 vertebral body lesion is again seen and stable from the prior PET-CT. Review of the MIP images confirms the above findings. IMPRESSION: Findings consistent with pulmonary embolism involving the right lower lobe and right middle lobe branches. No right heart strain is noted. Some associated new airspace opacity is noted in the right middle lobe likely related to the underlying embolus. No definitive infarct is noted. Changes consistent with prior left upper lobectomy consistent with the given clinical history. Scattered pulmonary nodules are noted unchanged from recent PET-CT from 04/23/2021. Multiple bony metastatic lesions with a pathologic fracture involving the right fourth rib laterally. Stable appearing large right adrenal lesion consistent with metastatic disease. Aortic Atherosclerosis (ICD10-I70.0). Critical Value/emergent results were called by telephone at the time of interpretation on 05/04/2021 at 7:34 pm to [facility] , who verbally acknowledged these results. Electronically Signed   By: Inez Catalina M.D.   On: 05/04/2021 19:35   NM PET Image Restag (PS) Skull Base To Thigh  Result Date: 04/25/2021 CLINICAL DATA:  Subsequent treatment strategy for non-small cell lung cancer. EXAM: NUCLEAR MEDICINE PET SKULL BASE TO THIGH TECHNIQUE: 10.0 mCi F-18 FDG was injected intravenously. Full-ring PET imaging was performed from the skull base to thigh after the radiotracer. CT data  was obtained and used for attenuation correction and anatomic localization. Fasting blood glucose: 98 mg/dl COMPARISON:  Multiple exams, including 11/25/2020 FINDINGS: Mediastinal blood pool activity: SUV max 2.6 Liver activity: SUV max NA NECK: Substantial activity in the anterior tongue with maximum SUV 13.4 (formerly 14.7, no underlying CT abnormality, most likely physiologic. Incidental CT findings: Mild bilateral chronic maxillary sinusitis. Bilateral common carotid atherosclerotic calcification. CHEST: A new 0.9 by 0.7 cm right lower lobe pulmonary nodule adjacent to the diaphragm on image 44 series 8 has a maximum SUV of 2.5. A new 0.6 by 0.4 cm right upper lobe nodule on image 14 series 8 has maximum SUV only 1.3 but is below sensitive PET-CT size thresholds. Other even smaller new nodularity in the right upper lobe noted including a 0.5 by 0.3 cm nodule on image 11 series 8. Calcified nodule in the posterior basal segment right lower lobe not hypermetabolic. The reduction in the left pleural effusion is noted with a peripheral area of probable rounded atelectasis in the left lower lobe measuring 2.7 by 1.2 cm on image 56 series 8 with maximum SUV 2.8. Malignancy is a differential diagnostic consideration. Incidental CT findings: Left upper lobectomy. Old granulomatous disease. Mild cardiomegaly. Coronary, aortic arch, and branch vessel atherosclerotic vascular disease. Prior CABG. ABDOMEN/PELVIS: New right adrenal mass 8.8 by 4.9 cm on image 107 series 4, maximum SUV 20.9, compatible with metastatic lesion. This indents the margin of the liver. New left adrenal mass, 1.9 by 2.1 cm, maximum SUV 22.4, compatible with malignancy. Accentuated bowel activity without CT correlate, likely physiologic, particularly in the right colon. Incidental CT findings: Old granulomatous disease. Atherosclerosis is present, including aortoiliac atherosclerotic disease. Sigmoid colon diverticulosis. SKELETON: Scattered new  osseous metastatic lesions are identified including the T1 vertebra, right scapula, right fourth rib, left seventh rib, right T10 vertebral body, and left iliac bone adjacent to the SI joint. Index lesion eccentric to the right at the T10 vertebral level has maximum SUV of 10.9 and is predominantly lytic with a mild sclerotic rim. Incidental CT findings: none IMPRESSION: 1. New metastatic disease including the right lung, bilateral adrenal glands, and multiple locations in the skeleton as noted above. 2. Prior left upper lobectomy with some  evolutionary findings in the left lung including reduced left pleural effusion and resolution of a region of probable rounded atelectasis in the left lower lobe (less likely to be tumor). 3. Aortic Atherosclerosis (ICD10-I70.0). Coronary atherosclerosis. Sigmoid colon diverticulosis. Chronic bilateral maxillary sinusitis. Old granulomatous disease. Prior CABG. Mild cardiomegaly. Electronically Signed   By: Van Clines M.D.   On: 04/25/2021 07:22   MR CARDIAC MORPHOLOGY W WO CONTRAST  Result Date: 04/18/2021 CLINICAL DATA:  Cardiomyopathy evaluation EXAM: CARDIAC MRI TECHNIQUE: The patient was scanned on a 1.5 Tesla Siemens magnet. A dedicated cardiac coil was used. Functional imaging was done using Fiesta sequences. 2,3, and 4 chamber views were done to assess for RWMA's. Modified Simpson's rule using a short axis stack was used to calculate an ejection fraction on a dedicated work Conservation officer, nature. The patient received 9 cc of Gadavist. After 10 minutes inversion recovery sequences were used to assess for infiltration and scar tissue. CONTRAST:  9 cc  of Gadavist FINDINGS: Left ventricle: -Mild dilatation -Moderate systolic dysfunction. Significant thinning/dyskinesis of focal region of basal to mid lateral wall -Nonspecific ECV elevation (31%) -Basal septal midwall LGE -Subendocardial LGE in focal region of basal to mid lateral wall. LGE >50% transmural  -RV insertion site LGE LV EF: 39% (Normal 56-78%) Absolute volumes: LV EDV: 254m (Normal 77-195 mL) LV ESV: 1356m(Normal 19-72 mL) LV SV: 9073mNormal 51-133 mL) CO: 5.9L/min (Normal 2.8-8.8 L/min) Indexed volumes: LV EDV: 107m33m-m (Normal 47-92 mL/sq-m) LV ESV: 65mL35mm (Normal 13-30 mL/sq-m) LV SV: 42mL/62m (Normal 32-62 mL/sq-m) CI: 2.7L/min/sq-m (Normal 1.7-4.2 L/min/sq-m) Right ventricle: Normal size with mild systolic dysfunction RV EF:  42% (Normal 47-74%) Absolute volumes: RV EDV: 215mL (74mal 88-227 mL) RV ESV: 124mL (N22ml 23-103 mL) RV SV: 91mL (No56m 52-138 mL) CO: 5.9L/min (Normal 2.8-8.8 L/min) Indexed volumes: RV EDV: 100mL/sq-m65mrmal 55-105 mL/sq-m) RV ESV: 58mL/sq-m 72mmal 15-43 mL/sq-m) RV SV: 43mL/sq-m (51mal 32-64 mL/sq-m) CI: 2.8L/min/sq-m (Normal 1.7-4.2 L/min/sq-m) Left atrium: Moderate enlargement Right atrium: Mild enlargement Mitral valve: Trivial regurgitation Aortic valve: Tricuspid. No regurgitation Tricuspid valve: Trivial regurgitation Pulmonic valve: No regurgitation Aorta: Normal proximal ascending aorta Pericardium: Normal IMPRESSION: 1. Mild LV dilatation with moderate systolic dysfunction (EF 39%). Signif49%nt thinning/dyskinesis of focal region of basal to mid lateral wall 2. 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 Subendocardial late gadolinium enhancement consistent with prior infarct in focal region of basal to mid lateral wall. LGE is greater than 50% transmural suggesting this region is not viable 4. Basal septal midwall LGE, which is a scar pattern seen in nonischemic cardiomyopathies and associated with worse prognosis 5. RV insertion site LGE, which is a nonspecific scar pattern often seen in setting of elevated pulmonary pressures 6.  Normal RV size with mild systolic dysfunction (EF 42%) Electro67%ally Signed   By: Christopher Oswaldo Milian09/30/2022 22:17   VAS US LOWER EXTKoreaMITY VENOUS (DVT)  Result  Date: 05/05/2021  Lower Venous DVT Study Patient Name:  Kyron HARACHASETON YEPIZam:   05/05/2021 Medical Rec #: [id number]   [id number] #:    [phone] [id number]: [birth year]   05-18-1947ient Gender: M Patient Age:   75 years [id number]     VAS US LOWER EXTKoreaMITY VENOUS (DVT) Referring Phys: JARED GARDNEJennette Kettle---------------------------------------------------------------------  Indications: Pulmonary embolism.  Risk Factors: Cancer lung cancer. Limitations: (88/72) Comparison Study:  no prior study Performing Technologist: Sharion Dove RVS  Examination Guidelines: A complete evaluation includes B-mode imaging, spectral Doppler, color Doppler, and power Doppler as needed of all accessible portions of each vessel. Bilateral testing is considered an integral part of a complete examination. Limited examinations for reoccurring indications may be performed as noted. The reflux portion of the exam is performed with the patient in reverse Trendelenburg.  +---------+---------------+---------+-----------+----------+--------------+ RIGHT    CompressibilityPhasicitySpontaneityPropertiesThrombus Aging +---------+---------------+---------+-----------+----------+--------------+ CFV      Full           Yes      Yes                                 +---------+---------------+---------+-----------+----------+--------------+ SFJ      Full                                                        +---------+---------------+---------+-----------+----------+--------------+ FV Prox  Full                                                        +---------+---------------+---------+-----------+----------+--------------+ FV Mid   Full                                                        +---------+---------------+---------+-----------+----------+--------------+ FV DistalFull                                                         +---------+---------------+---------+-----------+----------+--------------+ PFV      Full                                                        +---------+---------------+---------+-----------+----------+--------------+ POP      Full           Yes                                          +---------+---------------+---------+-----------+----------+--------------+ PTV      Full                                                        +---------+---------------+---------+-----------+----------+--------------+ PERO     Full                                                        +---------+---------------+---------+-----------+----------+--------------+   +---------+---------------+---------+-----------+----------+--------------+  LEFT     CompressibilityPhasicitySpontaneityPropertiesThrombus Aging +---------+---------------+---------+-----------+----------+--------------+ CFV      Full           Yes      Yes                                 +---------+---------------+---------+-----------+----------+--------------+ SFJ      Full                                                        +---------+---------------+---------+-----------+----------+--------------+ FV Prox  Full                                                        +---------+---------------+---------+-----------+----------+--------------+ FV Mid   Full                                                        +---------+---------------+---------+-----------+----------+--------------+ FV DistalFull                                                        +---------+---------------+---------+-----------+----------+--------------+ PFV      Full                                                        +---------+---------------+---------+-----------+----------+--------------+ POP      Full           Yes      No                                   +---------+---------------+---------+-----------+----------+--------------+ PTV      Full                                                        +---------+---------------+---------+-----------+----------+--------------+ PERO     Full                                                        +---------+---------------+---------+-----------+----------+--------------+ Gastroc  Full                                                        +---------+---------------+---------+-----------+----------+--------------+  Summary: BILATERAL: - No evidence of deep vein thrombosis seen in the lower extremities, bilaterally. - RIGHT: - There is no evidence of deep vein thrombosis in the lower extremity.   *See table(s) above for measurements and observations.    Preliminary      DG Chest 2 View  Result Date: 05/04/2021 CLINICAL DATA:  Chest pain EXAM: CHEST - 2 VIEW COMPARISON:  01/10/2021 FINDINGS: Blunting of left costophrenic angle, likely small effusion. Scarring in the left lung base. Right base atelectasis. Heart is normal size. Prior CABG. No acute bony abnormality. IMPRESSION: Small left pleural effusion. Left basilar scarring and right base atelectasis. Electronically Signed   By: Rolm Baptise M.D.   On: 05/04/2021 17:16   CT HEAD WO CONTRAST (5MM)  Result Date: 05/04/2021 CLINICAL DATA:  Altered mental status EXAM: CT HEAD WITHOUT CONTRAST TECHNIQUE: Contiguous axial images were obtained from the base of the skull through the vertex without intravenous contrast. COMPARISON:  None. FINDINGS: Brain: Mild age related volume loss. No acute intracranial abnormality. Specifically, no hemorrhage, hydrocephalus, mass lesion, acute infarction, or significant intracranial injury. Vascular: No hyperdense vessel or unexpected calcification. Skull: No acute calvarial abnormality. Sinuses/Orbits: No acute findings Other: None IMPRESSION: No acute intracranial abnormality. Electronically Signed   By: Rolm Baptise M.D.   On: 05/04/2021 19:23   CT Angio Chest PE W and/or Wo Contrast  Result Date: 05/04/2021 CLINICAL DATA:  History of lung carcinoma with chest pain, initial encounter EXAM: CT ANGIOGRAPHY CHEST WITH CONTRAST TECHNIQUE: Multidetector CT imaging of the chest was performed using the standard protocol during bolus administration of intravenous contrast. Multiplanar CT image reconstructions and MIPs were obtained to evaluate the vascular anatomy. CONTRAST:  37m OMNIPAQUE IOHEXOL 350 MG/ML SOLN COMPARISON:  04/23/2021 PET-CT FINDINGS: Cardiovascular: Atherosclerotic calcifications of the thoracic aorta are noted without aneurysmal dilatation or dissection. Changes of prior coronary bypass grafting are seen. Mild cardiac enlargement is noted. Coronary calcifications are identified as well. Pulmonary artery is well visualized with evidence of filling defect within right lower and middle lobe branches consistent with pulmonary embolus. No right heart strain is noted. No pericardial effusion is seen. Mediastinum/Nodes: Thoracic inlet is within normal limits. No sizable hilar or mediastinal adenopathy is noted. Small stable lymph nodes are seen. Calcified lymph nodes are noted within the right hila and subcarinal region consistent with prior granulomatous disease. The esophagus as visualized is within normal limits. Lungs/Pleura: Changes of prior left upper lobectomy are noted with volume loss identified. Some scarring is noted in the left lower lobe. There remains a pleural based density stable in appearance from the prior PET-CT which was mildly hypermetabolic on prior exam. Right lung shows stable nodules in the right upper lobe as well as within the right lower lobe similar to that seen on prior CT examination. Calcified granulomas are noted as well. Some mild focal infiltrate is seen within the right middle lobe likely sequelae from the pulmonary emboli. A few scattered smaller nodules are noted also  stable in appearance from the prior CT. Upper Abdomen: Visualized upper abdomen again shows a large right adrenal mass similar to that seen on prior PET-CT. Known left adrenal mass is not well visualized on this exam.No other upper abdominal abnormality is seen. Musculoskeletal: Degenerative changes of the thoracic spine are noted. Lytic lesion is noted in the left half of the T1 vertebra similar to that seen on prior PET-CT. Pathologic rib fracture is noted in the right fourth rib laterally which corresponds to  a hypermetabolic lesion seen on prior PET-CT. No other definitive rib fractures are seen. The known T10 vertebral body lesion is again seen and stable from the prior PET-CT. Review of the MIP images confirms the above findings. IMPRESSION: Findings consistent with pulmonary embolism involving the right lower lobe and right middle lobe branches. No right heart strain is noted. Some associated new airspace opacity is noted in the right middle lobe likely related to the underlying embolus. No definitive infarct is noted. Changes consistent with prior left upper lobectomy consistent with the given clinical history. Scattered pulmonary nodules are noted unchanged from recent PET-CT from 04/23/2021. Multiple bony metastatic lesions with a pathologic fracture involving the right fourth rib laterally. Stable appearing large right adrenal lesion consistent with metastatic disease. Aortic Atherosclerosis (ICD10-I70.0). Critical Value/emergent results were called by telephone at the time of interpretation on 05/04/2021 at 7:34 pm to [facility] , who verbally acknowledged these results. Electronically Signed   By: Inez Catalina M.D.   On: 05/04/2021 19:35   NM PET Image Restag (PS) Skull Base To Thigh  Result Date: 04/25/2021 CLINICAL DATA:  Subsequent treatment strategy for non-small cell lung cancer. EXAM: NUCLEAR MEDICINE PET SKULL BASE TO THIGH TECHNIQUE: 10.0 mCi F-18 FDG was injected intravenously.  Full-ring PET imaging was performed from the skull base to thigh after the radiotracer. CT data was obtained and used for attenuation correction and anatomic localization. Fasting blood glucose: 98 mg/dl COMPARISON:  Multiple exams, including 11/25/2020 FINDINGS: Mediastinal blood pool activity: SUV max 2.6 Liver activity: SUV max NA NECK: Substantial activity in the anterior tongue with maximum SUV 13.4 (formerly 14.7, no underlying CT abnormality, most likely physiologic. Incidental CT findings: Mild bilateral chronic maxillary sinusitis. Bilateral common carotid atherosclerotic calcification. CHEST: A new 0.9 by 0.7 cm right lower lobe pulmonary nodule adjacent to the diaphragm on image 44 series 8 has a maximum SUV of 2.5. A new 0.6 by 0.4 cm right upper lobe nodule on image 14 series 8 has maximum SUV only 1.3 but is below sensitive PET-CT size thresholds. Other even smaller new nodularity in the right upper lobe noted including a 0.5 by 0.3 cm nodule on image 11 series 8. Calcified nodule in the posterior basal segment right lower lobe not hypermetabolic. The reduction in the left pleural effusion is noted with a peripheral area of probable rounded atelectasis in the left lower lobe measuring 2.7 by 1.2 cm on image 56 series 8 with maximum SUV 2.8. Malignancy is a differential diagnostic consideration. Incidental CT findings: Left upper lobectomy. Old granulomatous disease. Mild cardiomegaly. Coronary, aortic arch, and branch vessel atherosclerotic vascular disease. Prior CABG. ABDOMEN/PELVIS: New right adrenal mass 8.8 by 4.9 cm on image 107 series 4, maximum SUV 20.9, compatible with metastatic lesion. This indents the margin of the liver. New left adrenal mass, 1.9 by 2.1 cm, maximum SUV 22.4, compatible with malignancy. Accentuated bowel activity without CT correlate, likely physiologic, particularly in the right colon. Incidental CT findings: Old granulomatous disease. Atherosclerosis is present,  including aortoiliac atherosclerotic disease. Sigmoid colon diverticulosis. SKELETON: Scattered new osseous metastatic lesions are identified including the T1 vertebra, right scapula, right fourth rib, left seventh rib, right T10 vertebral body, and left iliac bone adjacent to the SI joint. Index lesion eccentric to the right at the T10 vertebral level has maximum SUV of 10.9 and is predominantly lytic with a mild sclerotic rim. Incidental CT findings: none IMPRESSION: 1. New metastatic disease including the right lung, bilateral adrenal glands, and  multiple locations in the skeleton as noted above. 2. Prior left upper lobectomy with some evolutionary findings in the left lung including reduced left pleural effusion and resolution of a region of probable rounded atelectasis in the left lower lobe (less likely to be tumor). 3. Aortic Atherosclerosis (ICD10-I70.0). Coronary atherosclerosis. Sigmoid colon diverticulosis. Chronic bilateral maxillary sinusitis. Old granulomatous disease. Prior CABG. Mild cardiomegaly. Electronically Signed   By: Van Clines M.D.   On: 04/25/2021 07:22   MR CARDIAC MORPHOLOGY W WO CONTRAST  Result Date: 04/18/2021 CLINICAL DATA:  Cardiomyopathy evaluation EXAM: CARDIAC MRI TECHNIQUE: The patient was scanned on a 1.5 Tesla Siemens magnet. A dedicated cardiac coil was used. Functional imaging was done using Fiesta sequences. 2,3, and 4 chamber views were done to assess for RWMA's. Modified Simpson's rule using a short axis stack was used to calculate an ejection fraction on a dedicated work Conservation officer, nature. The patient received 9 cc of Gadavist. After 10 minutes inversion recovery sequences were used to assess for infiltration and scar tissue. CONTRAST:  9 cc  of Gadavist FINDINGS: Left ventricle: -Mild dilatation -Moderate systolic dysfunction. Significant thinning/dyskinesis of focal region of basal to mid lateral wall -Nonspecific ECV elevation (31%) -Basal  septal midwall LGE -Subendocardial LGE in focal region of basal to mid lateral wall. LGE >50% transmural -RV insertion site LGE LV EF: 39% (Normal 56-78%) Absolute volumes: LV EDV: 222m (Normal 77-195 mL) LV ESV: 1365m(Normal 19-72 mL) LV SV: 9035mNormal 51-133 mL) CO: 5.9L/min (Normal 2.8-8.8 L/min) Indexed volumes: LV EDV: 107m70m-m (Normal 47-92 mL/sq-m) LV ESV: 65mL37mm (Normal 13-30 mL/sq-m) LV SV: 42mL/53m (Normal 32-62 mL/sq-m) CI: 2.7L/min/sq-m (Normal 1.7-4.2 L/min/sq-m) Right ventricle: Normal size with mild systolic dysfunction RV EF:  42% (Normal 47-74%) Absolute volumes: RV EDV: 215mL (89mal 88-227 mL) RV ESV: 124mL (N14ml 23-103 mL) RV SV: 91mL (No69m 52-138 mL) CO: 5.9L/min (Normal 2.8-8.8 L/min) Indexed volumes: RV EDV: 100mL/sq-m52mrmal 55-105 mL/sq-m) RV ESV: 58mL/sq-m 19mmal 15-43 mL/sq-m) RV SV: 43mL/sq-m (73mal 32-64 mL/sq-m) CI: 2.8L/min/sq-m (Normal 1.7-4.2 L/min/sq-m) Left atrium: Moderate enlargement Right atrium: Mild enlargement Mitral valve: Trivial regurgitation Aortic valve: Tricuspid. No regurgitation Tricuspid valve: Trivial regurgitation Pulmonic valve: No regurgitation Aorta: Normal proximal ascending aorta Pericardium: Normal IMPRESSION: 1. Mild LV dilatation with moderate systolic dysfunction (EF 39%). Signif19%nt thinning/dyskinesis of focal region of basal to mid lateral wall 2. 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 Subendocardial late gadolinium enhancement consistent with prior infarct in focal region of basal to mid lateral wall. LGE is greater than 50% transmural suggesting this region is not viable 4. Basal septal midwall LGE, which is a scar pattern seen in nonischemic cardiomyopathies and associated with worse prognosis 5. RV insertion site LGE, which is a nonspecific scar pattern often seen in setting of elevated pulmonary pressures 6.  Normal RV size with mild systolic dysfunction (EF 42%) Electro37%ally Signed    By: Christopher Oswaldo Milian09/30/2022 22:17   VAS US LOWER EXTKoreaMITY VENOUS (DVT)  Result Date: 05/05/2021  Lower Venous DVT Study Patient Name:  Diondre HARALANDAN FEDIEam:   05/05/2021 Medical Rec #: [id number]   [id number] #:    [phone] [id number]: 10-24-1945   11/05/1947ient Gender: M Patient Age:   75 years [id number]     VAS US LOWER EXTKoreaMITY VENOUS (DVT) Referring Phys: JARED GARDNEJennette Kettle---------------------------------------------------------------------  Indications: Pulmonary embolism.  Risk Factors: Cancer lung cancer. Limitations: (88/72) Comparison Study: no prior study Performing Technologist: Sharion Dove RVS  Examination Guidelines: A complete evaluation includes B-mode imaging, spectral Doppler, color Doppler, and power Doppler as needed of all accessible portions of each vessel. Bilateral testing is considered an integral part of a complete examination. Limited examinations for reoccurring indications may be performed as noted. The reflux portion of the exam is performed with the patient in reverse Trendelenburg.  +---------+---------------+---------+-----------+----------+--------------+ RIGHT    CompressibilityPhasicitySpontaneityPropertiesThrombus Aging +---------+---------------+---------+-----------+----------+--------------+ CFV      Full           Yes      Yes                                 +---------+---------------+---------+-----------+----------+--------------+ SFJ      Full                                                        +---------+---------------+---------+-----------+----------+--------------+ FV Prox  Full                                                        +---------+---------------+---------+-----------+----------+--------------+ FV Mid   Full                                                         +---------+---------------+---------+-----------+----------+--------------+ FV DistalFull                                                        +---------+---------------+---------+-----------+----------+--------------+ PFV      Full                                                        +---------+---------------+---------+-----------+----------+--------------+ POP      Full           Yes                                          +---------+---------------+---------+-----------+----------+--------------+ PTV      Full                                                        +---------+---------------+---------+-----------+----------+--------------+ PERO     Full                                                        +---------+---------------+---------+-----------+----------+--------------+   +---------+---------------+---------+-----------+----------+--------------+  LEFT     CompressibilityPhasicitySpontaneityPropertiesThrombus Aging +---------+---------------+---------+-----------+----------+--------------+ CFV      Full           Yes      Yes                                 +---------+---------------+---------+-----------+----------+--------------+ SFJ      Full                                                        +---------+---------------+---------+-----------+----------+--------------+ FV Prox  Full                                                        +---------+---------------+---------+-----------+----------+--------------+ FV Mid   Full                                                        +---------+---------------+---------+-----------+----------+--------------+ FV DistalFull                                                        +---------+---------------+---------+-----------+----------+--------------+ PFV      Full                                                         +---------+---------------+---------+-----------+----------+--------------+ POP      Full           Yes      No                                  +---------+---------------+---------+-----------+----------+--------------+ PTV      Full                                                        +---------+---------------+---------+-----------+----------+--------------+ PERO     Full                                                        +---------+---------------+---------+-----------+----------+--------------+ Gastroc  Full                                                        +---------+---------------+---------+-----------+----------+--------------+  Summary: BILATERAL: - No evidence of deep vein thrombosis seen in the lower extremities, bilaterally. - RIGHT: - There is no evidence of deep vein thrombosis in the lower extremity.   *See table(s) above for measurements and observations.    Preliminary     Assessment and Plan:  1.  Stage IV non-small cell lung cancer, adenocarcinoma 2.  Pulmonary embolism 3.  Normocytic anemia 4.  Pathologic fracture to the right fourth rib 5.  Hypertension 6.  Drug-induced cardiomyopathy 7.  CAD 8.  Acute metabolic encephalopathy, improved  -Recommend continuation of Lumakras 960 mg daily for treatment of his lung cancer.  Family to bring in home supply and sent to pharmacy to dispense. -The patient has a new PE.  Currently on a heparin drip.  Recommend continuation of heparin drip and then can transition to Eliquis or Xarelto. -Hemoglobin down today 9.8.  May be dilutional.  Recommend close monitoring. -The patient was noted to have a pathologic rib fracture on the right.  He is having pain to this area.  He may benefit from radiation to this area.  Final recommendations per Dr. Marin Olp.  Additionally, we will follow-up on the MRI of the thoracic and lumbar spine as well as the MRI of the brain. -Management of chronic medical conditions per  hospitalist.  Mikey Bussing, DNP, AGPCNP-BC, AOCNP   ADDENDUM: I saw and examined Mr. Queenan this morning.  I agree with the above by Erasmo Downer.  I think the pulmonary embolism is multifactorial.  He does have the metastatic lung cancer.  He does have some congestive heart failure.  He is on heparin right now.  There is no thrombus in the legs by Doppler.  There had an MRI done.  MRI of the brain was negative for any metastatic disease.  MRI of the spine did show that there was some areas on T1 and T10.  He says he not having pain back there.  However, we might think about radiation therapy to this area.  Again he is on heparin.  It would be nice to try to get him onto something oral.  I do not see any problem with him being on the [place].  The Lupron that I have is that he says he pays $3000 a month for this.  This is absurd.  We will have to try to get this for him at a much more reasonable price.  His labs today show a white cell count of 7.8.  Hemoglobin 10.1.  Platelet count 283,000.  I think that we can probably try to get him onto Eliquis.  I think this would be reasonable.  I think this would be adequate for his thromboembolic disease, even in the face of malignancy.  Hopefully, we will be able to get him home soon.  His cardiac status on echocardiogram showed ejection fraction of 45-50%.  There is no right ventricular strain.  Again, I think the thromboembolic disease is probably multifactorial.  He does have progressive non-small cell lung cancer.  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  I know that he will get outstanding care from all the staff up on [address] E.  Jimmye Norman, MD  Psalms 23:4

## 2021-05-05 NOTE — Progress Notes (Signed)
ANTICOAGULATION CONSULT NOTE  Pharmacy Consult for heparin Indication: pulmonary embolus  Allergies  Allergen Reactions   Amlodipine Swelling    Swelling of ankles    Patient Measurements: Height: 5\' 11"  (180.3 cm) Weight: 90.7 kg (200 lb) IBW/kg (Calculated) : 75.3 Heparin Dosing Weight: TBW  Vital Signs: BP: 101/66 (10/17 0500) Pulse Rate: 80 (10/17 0500)  Labs: Recent Labs    05/04/21 1646 05/04/21 1935 05/04/21 2130 05/05/21 0438  HGB 11.7*  --  11.9* 9.8*  HCT 36.6*  --  35.0* 30.9*  PLT 267  --   --  261  HEPARINUNFRC  --   --   --  <0.10*  CREATININE 0.93  --   --  1.00  TROPONINIHS 13 14  --   --      Estimated Creatinine Clearance: 75.8 mL/min (by C-G formula based on SCr of 1 mg/dL).   Medical History: Past Medical History:  Diagnosis Date   BPH (benign prostatic hypertrophy)    Bradycardia    a. H/o asymptomatic bradycardia.   Coronary artery disease    a. s/p CABG ~2007 with LIMA to LAD, SVG seq to OM1 and OM2, SVG to PDA and SVG to diag. b. Abnormal nuc 03/2015 - s/p DES to SVG-OM1-OM2. 01/29/17 POBA with cutting balloon to SVG-->OM.  No ischemia Lexi myoview 2021   DCM (dilated cardiomyopathy) ([place]) 09/17/2015   mixed by cMRI 03/2021 with EF 39% with non viable infarct in basal to mid lateral wall and LGE pattern c/w nonischemic component as well. Mild RV dysfunction RVEF 42%.   Dyslipidemia    Dyspnea    r/t lung mass   ED (erectile dysfunction)    Fear of heights    Goals of care, counseling/discussion 06/19/2020   Hypertension    Non-small cell carcinoma of lung, stage 4, left ([place]) 06/19/2020   OSA (obstructive sleep apnea) 07/12/2015   Moderate OSA with AHI 16.5/hr now on CPAP at 8cm H2O, uses cpap nightly      RBBB    Shoulder, capsulitis, adhesive    Left Shoulder     Assessment: 30 YOM presenting with SOB and CP x 2-3d, CT shows PE in right lower lobe and right middle lobe, no RHS.  He is not on anticoagulation PTA, chronic anemia  stable, plts 267.  Heparin level undetectable on gtt at 1400 units/hr. No issues with line or bleeding reported per RN.  Goal of Therapy:  Heparin level 0.3-0.7 units/ml Monitor platelets by anticoagulation protocol: Yes   Plan:  Rebolus heparin 3000 units  Increase heparin infusion to 1750 units/hr F/u 8 hour heparin level  Sherlon Handing, PharmD, BCPS Please see amion for complete clinical pharmacist phone list 05/05/2021 5:36 AM

## 2021-05-05 NOTE — Progress Notes (Signed)
PROGRESS NOTE    John Hinton  WUJ:[id number] DOB: Mar 14, 1946 DOA: 05/04/2021 PCP: [name], MD   Brief Narrative: 75 year old with past medical history significant for CAD, stage IV non-small cell lung cancer status post left upper lobectomy, immunotherapy induced cardiomyopathy, presents to the ED complaining of chest pain, shortness of breath and confusion.  Patient was found to have PE.   Assessment & Plan:   Principal Problem:   Acute pulmonary embolism (HCC) Active Problems:   Essential hypertension, benign   DCM (dilated cardiomyopathy) (HCC)   S/P partial lobectomy of lung   Non-small cell carcinoma of lung, stage 4, left (HCC)   1-Acute PE: Right side: Continue with heparin drip CM consulted for further recommendation. 2D echo ordered: Lower extremity Doppler: No evidence of DVT  2-Non-small cell lung cancer stage IV: Metastatic disease on recent PET scan I brain pending Dr. Marin Olp will follow up in consultation Okay to resume [facility].   3-Hypertension: Hold blood pressure medication in the setting of acute PE to avoid hypotension  4-Drug induced cardiomyopathy: Holding diuretics for now  CAD: Continue with aspirin and statin. Holding Plavix for now PCI was 9562  Acute metabolic encephalopathy;  Related to acute illness PE.  He had MRI done out patient. Pending.  Right fourth rib fracture, pathologic, correspond to hypermetabolic lesion seen on PET scan.  Pain management.   Mild hyponatremia; monitor.   Estimated body mass index is 27.89 kg/m as calculated from the following:   Height as of this encounter: 5\' 11"  (1.803 m).   Weight as of this encounter: 90.7 kg.   DVT prophylaxis: Heparin Gtt  Code Status: Full Code Family Communication: Wife updated, at bedside.  Disposition Plan:  Status is: Inpatient  Remains inpatient appropriate because: Acute PE requiring IV heparin drip        Consultants:  Pulmonology Dr Marin Olp.    Procedures:  ECHO  Antimicrobials:    Subjective: He is alert, report chest pain, pleuritic. SOB.  Per wife he is less confuse.   Objective: Vitals:   05/05/21 0500 05/05/21 0515 05/05/21 0615 05/05/21 0645  BP: 101/66 98/64 115/74 118/70  Pulse: 80 79 79 91  Resp: (!) 25 (!) 26 (!) 27 (!) 27  Temp:      TempSrc:      SpO2: 94% 93% 94% 95%  Weight:      Height:       No intake or output data in the 24 hours ending 05/05/21 0801 Filed Weights   05/04/21 2209  Weight: 90.7 kg    Examination:  General exam: Appears calm and comfortable  Respiratory system: mild tachypnea, CTA Cardiovascular system: S1 & S2 heard, RRR.  Gastrointestinal system: Abdomen is nondistended, soft and nontender. No organomegaly or masses felt. Normal bowel sounds heard. Central nervous system: Alert and oriented. No focal neurological deficits. Extremities: Symmetric 5 x 5 power.  Data Reviewed: I have personally reviewed following labs and imaging studies  CBC: Recent Labs  Lab 05/04/21 1646 05/04/21 2130 05/05/21 0438  WBC 9.1  --  8.4  NEUTROABS 7.4  --   --   HGB 11.7* 11.9* 9.8*  HCT 36.6* 35.0* 30.9*  MCV 96.1  --  95.1  PLT 267  --  130   Basic Metabolic Panel: Recent Labs  Lab 05/04/21 1646 05/04/21 2130 05/05/21 0438  NA 135 134* 132*  K 4.1 4.1 4.1  CL 100  --  100  CO2 24  --  22  GLUCOSE  109*  --  99  BUN 20  --  24*  CREATININE 0.93  --  1.00  CALCIUM 9.5  --  8.6*   GFR: Estimated Creatinine Clearance: 75.8 mL/min (by C-G formula based on SCr of 1 mg/dL). Liver Function Tests: Recent Labs  Lab 05/04/21 1646  AST 20  ALT 19  ALKPHOS 80  BILITOT 0.7  PROT 7.8  ALBUMIN 3.4*   No results for input(s): LIPASE, AMYLASE in the last 168 hours. Recent Labs  Lab 05/04/21 1646  AMMONIA <10   Coagulation Profile: No results for input(s): INR, PROTIME in the last 168 hours. Cardiac Enzymes: No results for input(s): CKTOTAL, CKMB, CKMBINDEX, TROPONINI  in the last 168 hours. BNP (last 3 results) No results for input(s): PROBNP in the last 8760 hours. HbA1C: No results for input(s): HGBA1C in the last 72 hours. CBG: No results for input(s): GLUCAP in the last 168 hours. Lipid Profile: No results for input(s): CHOL, HDL, LDLCALC, TRIG, CHOLHDL, LDLDIRECT in the last 72 hours. Thyroid Function Tests: No results for input(s): TSH, T4TOTAL, FREET4, T3FREE, THYROIDAB in the last 72 hours. Anemia Panel: No results for input(s): VITAMINB12, FOLATE, FERRITIN, TIBC, IRON, RETICCTPCT in the last 72 hours. Sepsis Labs: Recent Labs  Lab 05/04/21 1819 05/04/21 1920  LATICACIDVEN 0.9 1.8    Recent Results (from the past 240 hour(s))  Resp Panel by RT-PCR (Flu A&B, Covid) Nasopharyngeal Swab     Status: None   Collection Time: 05/04/21  6:25 PM   Specimen: Nasopharyngeal Swab; Nasopharyngeal(NP) swabs in vial transport medium  Result Value Ref Range Status   SARS Coronavirus 2 by RT PCR NEGATIVE NEGATIVE Final    Comment: (NOTE) SARS-CoV-2 target nucleic acids are NOT DETECTED.  The SARS-CoV-2 RNA is generally detectable in upper respiratory specimens during the acute phase of infection. The lowest concentration of SARS-CoV-2 viral copies this assay can detect is 138 copies/mL. A negative result does not preclude SARS-Cov-2 infection and should not be used as the sole basis for treatment or other patient management decisions. A negative result may occur with  improper specimen collection/handling, submission of specimen other than nasopharyngeal swab, presence of viral mutation(s) within the areas targeted by this assay, and inadequate number of viral copies(<138 copies/mL). A negative result must be combined with clinical observations, patient history, and epidemiological information. The expected result is Negative.  Fact Sheet for Patients:  EntrepreneurPulse.com.au  Fact Sheet for Healthcare Providers:   IncredibleEmployment.be  This test is no t yet approved or cleared by the [place] FDA and  has been authorized for detection and/or diagnosis of SARS-CoV-2 by FDA under an Emergency Use Authorization (EUA). This EUA will remain  in effect (meaning this test can be used) for the duration of the COVID-19 declaration under Section 564(b)(1) of the Act, 21 U.S.C.section 360bbb-3(b)(1), unless the authorization is terminated  or revoked sooner.       Influenza A by PCR NEGATIVE NEGATIVE Final   Influenza B by PCR NEGATIVE NEGATIVE Final    Comment: (NOTE) The Xpert Xpress SARS-CoV-2/FLU/RSV plus assay is intended as an aid in the diagnosis of influenza from Nasopharyngeal swab specimens and should not be used as a sole basis for treatment. Nasal washings and aspirates are unacceptable for Xpert Xpress SARS-CoV-2/FLU/RSV testing.  Fact Sheet for Patients: EntrepreneurPulse.com.au  Fact Sheet for Healthcare Providers: IncredibleEmployment.be  This test is not yet approved or cleared by the [place] FDA and has been authorized for detection and/or diagnosis of  SARS-CoV-2 by FDA under an Emergency Use Authorization (EUA). This EUA will remain in effect (meaning this test can be used) for the duration of the COVID-19 declaration under Section 564(b)(1) of the Act, 21 U.S.C. section 360bbb-3(b)(1), unless the authorization is terminated or revoked.  Performed at [facility], [place] [address] [place], [place] [address]          Radiology Studies: DG Chest 2 View  Result Date: 05/04/2021 CLINICAL DATA:  Chest pain EXAM: CHEST - 2 VIEW COMPARISON:  01/10/2021 FINDINGS: Blunting of left costophrenic angle, likely small effusion. Scarring in the left lung base. Right base atelectasis. Heart is normal size. Prior CABG. No acute bony abnormality. IMPRESSION: Small left pleural effusion. Left basilar scarring and  right base atelectasis. Electronically Signed   By: Rolm Baptise M.D.   On: 05/04/2021 17:16   CT HEAD WO CONTRAST (5MM)  Result Date: 05/04/2021 CLINICAL DATA:  Altered mental status EXAM: CT HEAD WITHOUT CONTRAST TECHNIQUE: Contiguous axial images were obtained from the base of the skull through the vertex without intravenous contrast. COMPARISON:  None. FINDINGS: Brain: Mild age related volume loss. No acute intracranial abnormality. Specifically, no hemorrhage, hydrocephalus, mass lesion, acute infarction, or significant intracranial injury. Vascular: No hyperdense vessel or unexpected calcification. Skull: No acute calvarial abnormality. Sinuses/Orbits: No acute findings Other: None IMPRESSION: No acute intracranial abnormality. Electronically Signed   By: Rolm Baptise M.D.   On: 05/04/2021 19:23   CT Angio Chest PE W and/or Wo Contrast  Result Date: 05/04/2021 CLINICAL DATA:  History of lung carcinoma with chest pain, initial encounter EXAM: CT ANGIOGRAPHY CHEST WITH CONTRAST TECHNIQUE: Multidetector CT imaging of the chest was performed using the standard protocol during bolus administration of intravenous contrast. Multiplanar CT image reconstructions and MIPs were obtained to evaluate the vascular anatomy. CONTRAST:  55mL OMNIPAQUE IOHEXOL 350 MG/ML SOLN COMPARISON:  04/23/2021 PET-CT FINDINGS: Cardiovascular: Atherosclerotic calcifications of the thoracic aorta are noted without aneurysmal dilatation or dissection. Changes of prior coronary bypass grafting are seen. Mild cardiac enlargement is noted. Coronary calcifications are identified as well. Pulmonary artery is well visualized with evidence of filling defect within right lower and middle lobe branches consistent with pulmonary embolus. No right heart strain is noted. No pericardial effusion is seen. Mediastinum/Nodes: Thoracic inlet is within normal limits. No sizable hilar or mediastinal adenopathy is noted. Small stable lymph nodes are  seen. Calcified lymph nodes are noted within the right hila and subcarinal region consistent with prior granulomatous disease. The esophagus as visualized is within normal limits. Lungs/Pleura: Changes of prior left upper lobectomy are noted with volume loss identified. Some scarring is noted in the left lower lobe. There remains a pleural based density stable in appearance from the prior PET-CT which was mildly hypermetabolic on prior exam. Right lung shows stable nodules in the right upper lobe as well as within the right lower lobe similar to that seen on prior CT examination. Calcified granulomas are noted as well. Some mild focal infiltrate is seen within the right middle lobe likely sequelae from the pulmonary emboli. A few scattered smaller nodules are noted also stable in appearance from the prior CT. Upper Abdomen: Visualized upper abdomen again shows a large right adrenal mass similar to that seen on prior PET-CT. Known left adrenal mass is not well visualized on this exam.No other upper abdominal abnormality is seen. Musculoskeletal: Degenerative changes of the thoracic spine are noted. Lytic lesion is noted in the left half of the T1  vertebra similar to that seen on prior PET-CT. Pathologic rib fracture is noted in the right fourth rib laterally which corresponds to a hypermetabolic lesion seen on prior PET-CT. No other definitive rib fractures are seen. The known T10 vertebral body lesion is again seen and stable from the prior PET-CT. Review of the MIP images confirms the above findings. IMPRESSION: Findings consistent with pulmonary embolism involving the right lower lobe and right middle lobe branches. No right heart strain is noted. Some associated new airspace opacity is noted in the right middle lobe likely related to the underlying embolus. No definitive infarct is noted. Changes consistent with prior left upper lobectomy consistent with the given clinical history. Scattered pulmonary nodules are  noted unchanged from recent PET-CT from 04/23/2021. Multiple bony metastatic lesions with a pathologic fracture involving the right fourth rib laterally. Stable appearing large right adrenal lesion consistent with metastatic disease. Aortic Atherosclerosis (ICD10-I70.0). Critical Value/emergent results were called by telephone at the time of interpretation on 05/04/2021 at 7:34 pm to [facility] , who verbally acknowledged these results. Electronically Signed   By: Inez Catalina M.D.   On: 05/04/2021 19:35        Scheduled Meds:  aspirin EC  81 mg Oral QHS   ezetimibe  10 mg Oral QHS   folic acid  572 mcg Oral Daily   multivitamin with minerals  1 tablet Oral Daily   rosuvastatin  40 mg Oral QHS   sertraline  50 mg Oral BID   Continuous Infusions:  heparin 1,750 Units/hr (05/05/21 0625)     LOS: 1 day    Time spent: 35 minutes.     Elmarie Shiley, MD [facility]   If 7PM-7AM, please contact night-coverage [email or web address]  05/05/2021, 8:01 AM

## 2021-05-05 NOTE — Consult Note (Signed)
NAME:  Zamier Eggebrecht, MRN:  [id number], DOB:  [birth year], LOS: 1 ADMISSION DATE:  05/04/2021, CONSULTATION DATE:  10/17 REFERRING MD:  Tyrell Antonio, CHIEF COMPLAINT:  pulmonary emboli   History of Present Illness:  This is a 75 year old male patient followed by Dr. Marin Olp with advanced stage IV non-small cell lung cancer currently undergoing therapy (see below) Just recently seen by oncology with the news of disease progression.  From a breathing standpoint was in his usual state of health up until about 3 days prior to presentation to the emergency room.  He was undergoing MRI and imaging on 10/15 and when he returned home was having increased shortness of breath, and pleuritic type chest discomfort primarily on the right side.  His wife also noted some intermittent confusion.  The symptoms continued to worsen, and progressed to the point where he had resting dyspnea and fairly significant right sided pleuritic chest pain with minimal activity.  This ultimately drove him to the emergency room where he was evaluated by the emergency room team, CT angiogram was obtained showing pulmonary emboli involving the right lower and right middle lobe branches there is no RV strain suggested.  There was concern about possible pulmonary infarct involving the right middle lobe.  He was placed on IV heparin, and pulmonary consulted.  Pertinent  Medical History  Stage 4 NSCLC, S/P Left upper lobectomy, w/ new metastatic dz to adrenal glands, bone, and R lung ( S/P cycle #6 of Carbo/Alimta/Pembrolizumab Alimta/pembrolizumab-maintenance therapy s/p cycle 3/4  - start on 11/28/2020 --Alimta discontinued after cycle #2. Lumakras 960 mg po q day -- started on 14/48/1856 diastolic heart failure AND HFrEF (EF 39% w/ cardiac MRI suggesting mixed picture of ischemic and non-ischemic etiology w/ non-ischemic component being immunotherapy induced. cad Significant Hospital Events: Including procedures, antibiotic start and stop  dates in addition to other pertinent events   10/16 admitted with acute pulmonary emboli.  CT chest showing right lower lobe right middle lobe branch involvement with associated right middle lobe area concerning for infarct.  Prior left upper lobe lobectomy.  Scattered pulmonary nodules.  Multiple bony metastasis and rib fracture of the fourth right rib. Initial PESI 123=class IV high risk.  Bilateral lower extremity ultrasound negative for DVT.  Pulmonary asked to evaluate  Interim History / Subjective:  Resting comfortably Objective   Blood pressure 104/74, pulse 60, temperature 98.8 F (37.1 C), temperature source Oral, resp. rate 20, height 5\' 11"  (1.803 m), weight 90.7 kg, SpO2 95 %.       No intake or output data in the 24 hours ending 05/05/21 1131 Filed Weights   05/04/21 2209  Weight: 90.7 kg    Examination: General: 75 year old male patient lying in hospital stretcher currently in no acute distress HENT: Normocephalic atraumatic no jugular venous distention appreciated mucous membranes moist Lungs: Clear diminished bases Cardiovascular: Regular rhythm cardiac S3 gallop appreciated Abdomen: Soft not tender Extremities: No edema brisk cap refill Neuro: Awake oriented GU: Due to void  Resolved Hospital Problem list     Assessment & Plan:   Acute pulmonary emboli Stage IV non-small cell lung cancer with metastasis to right rib adrenal glands and bone Systolic cardiomyopathy  Diastolic cardiomyopathy History of coronary artery disease Acute pulmonary emboli    Acute pulmonary emboli -Provoking factor being active malignancy -PESI score 123 suggesting class IV/high risk, however current exam hemodynamically stable, no tachycardia, and on room air.  Preliminarily no evidence of right heart strain on CT imaging Plan Continue IV  heparin with plan to transition to oral therapy Given high PESI score would admit to SDU for at least 24hrs Will need anticoagulation  indefinitely  Follow-up echocardiogram  Best Practice (right click and "Reselect all SmartList Selections" daily)   Per primary   Labs   CBC: Recent Labs  Lab 05/04/21 1646 05/04/21 2130 05/05/21 0438  WBC 9.1  --  8.4  NEUTROABS 7.4  --   --   HGB 11.7* 11.9* 9.8*  HCT 36.6* 35.0* 30.9*  MCV 96.1  --  95.1  PLT 267  --  169    Basic Metabolic Panel: Recent Labs  Lab 05/04/21 1646 05/04/21 2130 05/05/21 0438  NA 135 134* 132*  K 4.1 4.1 4.1  CL 100  --  100  CO2 24  --  22  GLUCOSE 109*  --  99  BUN 20  --  24*  CREATININE 0.93  --  1.00  CALCIUM 9.5  --  8.6*   GFR: Estimated Creatinine Clearance: 75.8 mL/min (by C-G formula based on SCr of 1 mg/dL). Recent Labs  Lab 05/04/21 1646 05/04/21 1819 05/04/21 1920 05/05/21 0438  WBC 9.1  --   --  8.4  LATICACIDVEN  --  0.9 1.8  --     Liver Function Tests: Recent Labs  Lab 05/04/21 1646  AST 20  ALT 19  ALKPHOS 80  BILITOT 0.7  PROT 7.8  ALBUMIN 3.4*   No results for input(s): LIPASE, AMYLASE in the last 168 hours. Recent Labs  Lab 05/04/21 1646  AMMONIA <10    ABG    Component Value Date/Time   PHART 7.478 (H) 05/04/2021 2130   PCO2ART 27.5 (L) 05/04/2021 2130   PO2ART 69 (L) 05/04/2021 2130   HCO3 20.4 05/04/2021 2130   TCO2 21 (L) 05/04/2021 2130   ACIDBASEDEF 2.0 05/04/2021 2130   O2SAT 95.0 05/04/2021 2130     Coagulation Profile: No results for input(s): INR, PROTIME in the last 168 hours.  Cardiac Enzymes: No results for input(s): CKTOTAL, CKMB, CKMBINDEX, TROPONINI in the last 168 hours.  HbA1C: No results found for: HGBA1C  CBG: No results for input(s): GLUCAP in the last 168 hours.  Review of Systems:    Review of Systems  Constitutional:  Negative for fever and weight loss.  HENT: Negative.    Eyes: Negative.   Respiratory:  Positive for shortness of breath.   Cardiovascular:  Positive for chest pain and leg swelling.  Gastrointestinal: Negative.    Genitourinary: Negative.   Musculoskeletal: Negative.   Skin: Negative.   Neurological: Negative.   Endo/Heme/Allergies: Negative.   Psychiatric/Behavioral: Negative.       Past Medical History:  He,  has a past medical history of BPH (benign prostatic hypertrophy), Bradycardia, Coronary artery disease, DCM (dilated cardiomyopathy) ([place]) (09/17/2015), Dyslipidemia, Dyspnea, ED (erectile dysfunction), Fear of heights, Goals of care, counseling/discussion (06/19/2020), Hypertension, Non-small cell carcinoma of lung, stage 4, left ([place]) (06/19/2020), OSA (obstructive sleep apnea) (07/12/2015), RBBB, and Shoulder, capsulitis, adhesive.   Surgical History:   Past Surgical History:  Procedure Laterality Date   APPENDECTOMY     BRONCHIAL BIOPSY  05/09/2020   Procedure: BRONCHIAL BIOPSIES;  Surgeon: [name], DO;  Location: [facility];  Service: Pulmonary;;   BRONCHIAL BRUSHINGS  05/09/2020   Procedure: BRONCHIAL BRUSHINGS;  Surgeon: [name], DO;  Location: [place] [facility];  Service: Pulmonary;;   BRONCHIAL NEEDLE ASPIRATION BIOPSY  05/09/2020   Procedure: BRONCHIAL NEEDLE ASPIRATION BIOPSIES;  Surgeon: Valeta Harms,  Octavio Graves, DO;  Location: [facility];  Service: Pulmonary;;   BRONCHIAL WASHINGS  05/09/2020   Procedure: BRONCHIAL WASHINGS;  Surgeon: [name], DO;  Location: [facility];  Service: Pulmonary;;   CARDIAC CATHETERIZATION N/A 04/05/2015   Procedure: Left Heart Cath and Coronary Angiography;  Surgeon: Sherren Mocha, MD;  Location: [facility];  Service: Cardiovascular;  Laterality: N/A;   COLONOSCOPY     CORONARY ARTERY BYPASS GRAFT     w/LIMA to LAD, seq SVG to OM1 and OM2, SVG to PDA and SVG to Diagonal   CORONARY BALLOON ANGIOPLASTY N/A 01/29/2017   Procedure: Coronary Balloon Angioplasty;  Surgeon: Martinique, Yaiza Palazzola M, MD;  Location: [facility];  Service: Cardiovascular;  Laterality: N/A;   FIDUCIAL MARKER PLACEMENT  05/09/2020   Procedure:  FIDUCIAL MARKER PLACEMENT;  Surgeon: [name], DO;  Location: [facility];  Service: Pulmonary;;   INTERCOSTAL NERVE BLOCK Left 05/22/2020   Procedure: INTERCOSTAL NERVE BLOCK;  Surgeon: Lajuana Matte, MD;  Location: [facility];  Service: Thoracic;  Laterality: Left;   LEFT HEART CATH AND CORS/GRAFTS ANGIOGRAPHY N/A 01/29/2017   Procedure: Left Heart Cath and Cors/Grafts Angiography;  Surgeon: Martinique, Coury Grieger M, MD;  Location: [facility];  Service: Cardiovascular;  Laterality: N/A;   NODE DISSECTION N/A 05/22/2020   Procedure: NODE DISSECTION;  Surgeon: Lajuana Matte, MD;  Location: [facility];  Service: Thoracic;  Laterality: N/A;   VIDEO BRONCHOSCOPY WITH ENDOBRONCHIAL NAVIGATION N/A 05/09/2020   Procedure: VIDEO BRONCHOSCOPY WITH ENDOBRONCHIAL NAVIGATION;  Surgeon: [name], DO;  Location: [facility];  Service: Pulmonary;  Laterality: N/A;   VIDEO BRONCHOSCOPY WITH ENDOBRONCHIAL ULTRASOUND N/A 05/09/2020   Procedure: VIDEO BRONCHOSCOPY WITH ENDOBRONCHIAL ULTRASOUND;  Surgeon: [name], DO;  Location: [facility];  Service: Pulmonary;  Laterality: N/A;     Social History:   reports that he quit smoking about 37 years ago. His smoking use included cigarettes. He has quit using smokeless tobacco. He reports current alcohol use of about 20.0 - 28.0 standard drinks per week. He reports that he does not use drugs.   Family History:  His family history includes Cancer in his sister; Heart disease in his father; Heart failure in his father; Lung cancer in his father and mother.   Allergies Allergies  Allergen Reactions   Amlodipine Swelling    Swelling of ankles     Home Medications  Prior to Admission medications   Medication Sig Start Date End Date Taking? Authorizing Provider  acetaminophen (TYLENOL) 500 MG tablet Take 1,000 mg by mouth every 6 (six) hours as needed for moderate pain or headache.   Yes [provider]  aspirin 81 MG tablet Take  81 mg by mouth at bedtime.    Yes [provider]  cholecalciferol (VITAMIN D) 1000 UNITS tablet Take 1,000 Units by mouth at bedtime.    Yes [provider]  clopidogrel (PLAVIX) 75 MG tablet Take 1 tablet (75 mg total) by mouth daily. 06/25/20  Yes Turner, Eber Hong, MD  Cyanocobalamin (B-12 PO) Take 1,000 mcg by mouth daily.    Yes [provider]  doxazosin (CARDURA) 2 MG tablet TAKE ONE TABLET BY MOUTH DAILY Patient taking differently: Take 2 mg by mouth daily. 03/19/21  Yes Turner, Eber Hong, MD  ezetimibe (ZETIA) 10 MG tablet Take 1 tablet (10 mg total) by mouth daily. Patient taking differently: Take 10 mg by mouth at bedtime. 06/25/20  Yes Turner, Eber Hong,  MD  folic acid (FOLVITE) 174 MCG tablet Take 400 mcg by mouth daily.   Yes [provider]  ibuprofen (ADVIL) 200 MG tablet Take 400 mg by mouth every 6 (six) hours as needed for headache or moderate pain.   Yes [provider]  Multiple Vitamin (MULTIVITAMIN) tablet Take 1 tablet by mouth daily.   Yes [provider]  nitroGLYCERIN (NITROSTAT) 0.4 MG SL tablet Place 1 tablet (0.4 mg total) under the tongue every 5 (five) minutes as needed for chest pain. 01/27/17  Yes Bhagat, Bhavinkumar, PA  rosuvastatin (CRESTOR) 40 MG tablet TAKE ONE TABLET BY MOUTH EVERY NIGHT AT BEDTIME Patient taking differently: Take 40 mg by mouth at bedtime. 03/19/21  Yes Turner, Eber Hong, MD  sacubitril-valsartan (ENTRESTO) 24-26 MG Take 1 tablet by mouth 2 (two) times daily. 04/25/21  Yes Turner, Eber Hong, MD  sertraline (ZOLOFT) 50 MG tablet Take 50 mg by mouth in the morning and at bedtime.    Yes [provider]  sotorasib (LUMAKRAS) 120 MG TABS Take 8 tablets (960 mg) by mouth daily after breakfast. 04/28/21  Yes Ennever, Rudell Cobb, MD  spironolactone (ALDACTONE) 25 MG tablet TAKE ONE TABLET BY MOUTH DAILY Patient taking differently: Take 25 mg by mouth daily. 06/25/20  Yes Turner, Eber Hong, MD  traMADol  (ULTRAM) 50 MG tablet Take 1 tablet (50 mg total) by mouth every 6 (six) hours as needed. Patient taking differently: Take 50 mg by mouth every 6 (six) hours as needed for moderate pain. 04/28/21  Yes [name], MD  prochlorperazine (COMPAZINE) 10 MG tablet Take 1 tablet (10 mg total) by mouth every 6 (six) hours as needed (Nausea or vomiting). 06/24/20 04/25/21  [name], MD     Critical care time: NA     Erick Colace ACNP-BC [place] Pager # [phone] OR # [phone] if no answer

## 2021-05-05 NOTE — ED Notes (Signed)
Pt requesting pain med at this time

## 2021-05-05 NOTE — Progress Notes (Signed)
  Echocardiogram 2D Echocardiogram has been performed.  Marybelle Killings 05/05/2021, 11:15 AM

## 2021-05-05 NOTE — ED Notes (Signed)
Pt provided cup of water and requested to sit up on the side of the bed for a few min. Pt adjusted at this time. Pt advised to not get out of bed completely without this RN at side. Pt verbalized understanding

## 2021-05-05 NOTE — Telephone Encounter (Signed)
See MyChart message

## 2021-05-05 NOTE — Telephone Encounter (Signed)
Patient was recently in the ED and had labs done there. The wife wanted to know if he needs to have repeat labs done before his upcoming stress test  Please advise

## 2021-05-06 ENCOUNTER — Other Ambulatory Visit (HOSPITAL_COMMUNITY): Payer: Self-pay

## 2021-05-06 ENCOUNTER — Encounter: Payer: Self-pay | Admitting: Hematology & Oncology

## 2021-05-06 ENCOUNTER — Other Ambulatory Visit: Payer: Self-pay

## 2021-05-06 ENCOUNTER — Encounter: Payer: Self-pay | Admitting: *Deleted

## 2021-05-06 ENCOUNTER — Ambulatory Visit (HOSPITAL_COMMUNITY): Payer: Medicare Other

## 2021-05-06 DIAGNOSIS — I2510 Atherosclerotic heart disease of native coronary artery without angina pectoris: Secondary | ICD-10-CM

## 2021-05-06 DIAGNOSIS — C3492 Malignant neoplasm of unspecified part of left bronchus or lung: Secondary | ICD-10-CM

## 2021-05-06 DIAGNOSIS — I493 Ventricular premature depolarization: Secondary | ICD-10-CM

## 2021-05-06 DIAGNOSIS — I2699 Other pulmonary embolism without acute cor pulmonale: Secondary | ICD-10-CM | POA: Diagnosis not present

## 2021-05-06 LAB — CBC
HCT: 31.1 % — ABNORMAL LOW (ref 39.0–52.0)
Hemoglobin: 10.1 g/dL — ABNORMAL LOW (ref 13.0–17.0)
MCH: 30.7 pg (ref 26.0–34.0)
MCHC: 32.5 g/dL (ref 30.0–36.0)
MCV: 94.5 fL (ref 80.0–100.0)
Platelets: 283 10*3/uL (ref 150–400)
RBC: 3.29 MIL/uL — ABNORMAL LOW (ref 4.22–5.81)
RDW: 14.3 % (ref 11.5–15.5)
WBC: 7.8 10*3/uL (ref 4.0–10.5)
nRBC: 0 % (ref 0.0–0.2)

## 2021-05-06 LAB — MAGNESIUM: Magnesium: 2.1 mg/dL (ref 1.7–2.4)

## 2021-05-06 LAB — HEPARIN LEVEL (UNFRACTIONATED): Heparin Unfractionated: 0.16 IU/mL — ABNORMAL LOW (ref 0.30–0.70)

## 2021-05-06 LAB — POTASSIUM: Potassium: 4.2 mmol/L (ref 3.5–5.1)

## 2021-05-06 MED ORDER — HEPARIN BOLUS VIA INFUSION
3000.0000 [IU] | Freq: Once | INTRAVENOUS | Status: AC
  Administered 2021-05-06: 3000 [IU] via INTRAVENOUS
  Filled 2021-05-06: qty 3000

## 2021-05-06 MED ORDER — OXYCODONE HCL 5 MG PO TABS
5.0000 mg | ORAL_TABLET | Freq: Two times a day (BID) | ORAL | 0 refills | Status: AC | PRN
  Filled 2021-05-06: qty 6, 3d supply, fill #0

## 2021-05-06 MED ORDER — METOPROLOL SUCCINATE ER 25 MG PO TB24: 25.0000 mg | ORAL_TABLET | Freq: Every day | ORAL | Status: DC

## 2021-05-06 MED ORDER — APIXABAN (ELIQUIS) VTE STARTER PACK (10MG AND 5MG)
ORAL_TABLET | ORAL | 0 refills | Status: DC
  Filled 2021-05-06: qty 74, 30d supply, fill #0

## 2021-05-06 MED ORDER — METOPROLOL SUCCINATE ER 25 MG PO TB24: 25.0000 mg | ORAL_TABLET | Freq: Every day | ORAL | 0 refills | Status: DC

## 2021-05-06 MED ORDER — APIXABAN 5 MG PO TABS
10.0000 mg | ORAL_TABLET | Freq: Two times a day (BID) | ORAL | Status: DC
  Administered 2021-05-06: 10 mg via ORAL
  Filled 2021-05-06: qty 2

## 2021-05-06 MED ORDER — APIXABAN 5 MG PO TABS: 5.0000 mg | ORAL_TABLET | Freq: Two times a day (BID) | ORAL | 4 refills | Status: DC

## 2021-05-06 MED ORDER — APIXABAN 5 MG PO TABS: 5.0000 mg | ORAL_TABLET | Freq: Two times a day (BID) | ORAL | Status: DC

## 2021-05-06 MED ORDER — LUMAKRAS 120 MG PO TABS: 960.0000 mg | ORAL_TABLET | Freq: Every day | ORAL | 5 refills | Status: DC

## 2021-05-06 NOTE — Progress Notes (Signed)
Patient is hospitalized with PE. Will continue to follow for any post discharge follow up needs.   Dr Marin Olp requests that we look into any possible financial aid for his Lumakras as he paid over $3K out of pocket. Spoke to [employer] and she stated that this was already investigated but the household income is higher than any assistance allows. Notified Dr Marin Olp.   Oncology Nurse Navigator Documentation  Oncology Nurse Navigator Flowsheets 05/06/2021  Abnormal Finding Date -  Confirmed Diagnosis Date -  Diagnosis Status -  Planned Course of Treatment -  Phase of Treatment -  Chemotherapy Actual Start Date: -  Chemotherapy Actual End Date: -  Surgery Actual Start Date: -  Navigator Follow Up Date: 05/09/2021  Navigator Follow Up Reason: Appointment Review  Navigator Location [facility]  Referral Date to RadOnc/MedOnc -  Navigator Encounter Type Appt/Treatment Plan Review  Telephone -  Treatment Initiated Date -  Patient Visit Type MedOnc  Treatment Phase Active Tx  Barriers/Navigation Needs Coordination of Care;Education  Education -  Interventions Medication Assistance  Acuity Level 2-Minimal Needs (1-2 Barriers Identified)  Coordination of Care -  Education Method -  Support Groups/Services Friends and Family  Time Spent with Patient 30

## 2021-05-06 NOTE — TOC Progression Note (Signed)
Transition of Care [facility]) - Progression Note    Patient Details  Name: John Hinton MRN: [id number] Date of Birth: 02/09/1946  Transition of [facility]) CM/SW Contact  Zenon Mayo, RN Phone Number: 05/06/2021, 9:45 AM  Clinical Narrative:    NCM spoke with patient and wife at the bedside, he may be for possible dc today,awaiting to see what Cardiology says.  He will be on eliquis, NCM asked MD to send scripts to [facility] to fill for patient.          Expected Discharge Plan and Services           Expected Discharge Date: 05/06/21                                     Social Determinants of Health (SDOH) Interventions    Readmission Risk Interventions No flowsheet data found.

## 2021-05-06 NOTE — Progress Notes (Signed)
Pt had 30+ beat run of V tach. Pt asymptomatic and resting in bed. MD paged.

## 2021-05-06 NOTE — Progress Notes (Signed)
ANTICOAGULATION CONSULT NOTE - Follow Up Consult  Pharmacy Consult for heparin >> apixaban  Indication: pulmonary embolus  Labs: Recent Labs    05/04/21 1646 05/04/21 1935 05/04/21 2130 05/05/21 0438 05/05/21 1626 05/06/21 0252  HGB 11.7*  --  11.9* 9.8*  --  10.1*  HCT 36.6*  --  35.0* 30.9*  --  31.1*  PLT 267  --   --  261  --  283  HEPARINUNFRC  --   --   --  <0.10* 0.19* 0.16*  CREATININE 0.93  --   --  1.00  --   --   TROPONINIHS 13 14  --   --   --   --      Assessment: 75yo male with acute right sided PE 10/16, no right heart strain in the setting of stage IV non-small cell lung cancer. Pharmacy consulted to transition heparin to apixaban.  Good renal function. H/H, plt stable.    Goal of Therapy:  Monitor platelets by anticoagulation protocol: Yes  Plan:  Stop heparin Start apixaban 10 mg BID x 7 days the 5mg  BID Monitor for signs/symptoms of bleeding   Benetta Spar, PharmD, BCPS, BCCP Clinical Pharmacist  Please check AMION for all [facility] phone numbers After 10:00 PM, call [facility] [phone]

## 2021-05-06 NOTE — Discharge Summary (Signed)
Discharge Summary  John Hinton XHB:[id number] DOB: [birth year]  PCP: [name], MD  Admit date: 05/04/2021 Discharge date: 05/06/2021  Time spent: 35 minutes.  Recommendations for Outpatient Follow-up:  Follow-up with medical oncology. Follow-up with cardiology.  Discharge Diagnoses:  Active Hospital Problems   Diagnosis Date Noted   Acute pulmonary embolism ([place]) 05/04/2021   Non-small cell carcinoma of lung, stage 4, left ([place]) 06/19/2020   S/P partial lobectomy of lung 05/22/2020   DCM (dilated cardiomyopathy) ([place]) 09/17/2015   Essential hypertension, benign 08/18/2013    Resolved Hospital Problems  No resolved problems to display.    Discharge Condition: Stable  Diet recommendation: Resume previous diet.  Vitals:   05/06/21 1103 05/06/21 1607  BP: 120/75 136/80  Pulse: 84 81  Resp: 18 16  Temp: 98.6 F (37 C) 99.1 F (37.3 C)  SpO2: 100% 97%    History of present illness:   75 year old with past medical history significant for CAD, stage IV non-small cell lung cancer status post left upper lobectomy, immunotherapy induced cardiomyopathy, presents to the ED complaining of chest pain, shortness of breath and confusion.  Patient was found to have submassive PE for which PCCM was consulted.  Recommended discharge home on Eliquis or Xarelto.  Due to stable hemodynamics, thrombolytics were not recommended.  His thrombus is not amenable to embolectomy due to distal location.  Cardiology was consulted for cardiac medications management.  05/06/2021: Patient was seen and examined at his bedside.  Admitted to intermittent right-sided pleuritic pain with deep breathing.  No other complaints.    Hospital Course:  Principal Problem:   Acute pulmonary embolism (HCC) Active Problems:   Essential hypertension, benign   DCM (dilated cardiomyopathy) (H[facility])   S/P partial lobectomy of lung   Non-small cell carcinoma of lung, stage 4, left (HCC)  Assessment: Acute  pulmonary emboli Acute metabolic encephalopathy secondary to acute illness, resolved Stage IV non-small cell lung cancer with metastasis to right rib adrenal glands and bone Systolic cardiomyopathy  Diastolic cardiomyopathy History of coronary artery disease Right fourth rib fracture, pathologic.  Plan: Continue Eliquis and aspirin Continue current medications as recommended by cardiology. Pain control Follow-up with oncology Follow-up with cardiology Repeat BMP at on 05/13/2021 as recommended by cardiology.  Per cardiology, Dr. Harrell Gave: "For your blood pressure: We are restarting the entresto and spironolactone, as well as adding metoprolol at night time. All of these can affect your blood pressure. If you feel dizzy/lightheaded, please contact us. If your blood pressure drops to <100/70, please stop the spironolactone and let us know."   Procedures: 2D echo 05/05/2021, LVEF 45 to 81%, grade 1 diastolic dysfunction, right ventricular size is moderately enlarged.  Mild mitral valve regurgitation.  No evidence of mitral stenosis.  Consultations: Medical oncology Cardiology  Discharge Exam: BP 136/80 (BP Location: Left Arm)   Pulse 81   Temp 99.1 F (37.3 C) (Oral)   Resp 16   Ht 5\' 11"  (1.803 m)   Wt 87.3 kg   SpO2 97%   BMI 26.83 kg/m  General: 75 y.o. year-old male well developed well nourished in no acute distress.  Alert and oriented x3. Cardiovascular: Regular rate and rhythm with no rubs or gallops.  No thyromegaly or JVD noted.   Respiratory: Clear to auscultation with no wheezes or rales.  Poor inspiratory effort. Abdomen: Soft nontender nondistended with normal bowel sounds x4 quadrants. Musculoskeletal: No lower extremity edema. 2/4 pulses in all 4 extremities. Skin: No ulcerative lesions noted or rashes, Psychiatry:  Mood is appropriate for condition and setting  Discharge Instructions You were cared for by a hospitalist during your hospital stay. If you  have any questions about your discharge medications or the care you received while you were in the hospital after you are discharged, you can call the unit and asked to speak with the hospitalist on call if the hospitalist that took care of you is not available. Once you are discharged, your primary care physician will handle any further medical issues. Please note that NO REFILLS for any discharge medications will be authorized once you are discharged, as it is imperative that you return to your primary care physician (or establish a relationship with a primary care physician if you do not have one) for your aftercare needs so that they can reassess your need for medications and monitor your lab values.   Allergies as of 05/06/2021       Reactions   Amlodipine Swelling   Swelling of ankles        Medication List     STOP taking these medications    clopidogrel 75 MG tablet Commonly known as: PLAVIX   ibuprofen 200 MG tablet Commonly known as: ADVIL   multivitamin tablet       TAKE these medications    acetaminophen 500 MG tablet Commonly known as: TYLENOL Take 1,000 mg by mouth every 6 (six) hours as needed for moderate pain or headache.   aspirin 81 MG tablet Take 81 mg by mouth at bedtime.   B-12 PO Take 1,000 mcg by mouth daily.   cholecalciferol 1000 units tablet Commonly known as: VITAMIN D Take 1,000 Units by mouth at bedtime.   doxazosin 2 MG tablet Commonly known as: CARDURA TAKE ONE TABLET BY MOUTH DAILY   Eliquis DVT/PE Starter Pack Generic drug: Apixaban Starter Pack (10mg  and 5mg ) Take as directed on package: start with two-5mg  tablets twice daily for 7 days. On day 8, switch to one-5mg  tablet twice daily.   Entresto 24-26 MG Generic drug: sacubitril-valsartan Take 1 tablet by mouth 2 (two) times daily.   ezetimibe 10 MG tablet Commonly known as: ZETIA Take 1 tablet (10 mg total) by mouth daily. What changed: when to take this   folic acid 016  MCG tablet Commonly known as: FOLVITE Take 400 mcg by mouth daily.   Lumakras 120 MG Tabs Generic drug: sotorasib Take 8 tablets (960 mg) by mouth daily after breakfast.   metoprolol succinate 25 MG 24 hr tablet Commonly known as: TOPROL-XL Take 1 tablet (25 mg total) by mouth at bedtime.   nitroGLYCERIN 0.4 MG SL tablet Commonly known as: NITROSTAT Place 1 tablet (0.4 mg total) under the tongue every 5 (five) minutes as needed for chest pain.   oxyCODONE 5 MG immediate release tablet Commonly known as: Roxicodone Take 1 tablet (5 mg total) by mouth 2 (two) times daily as needed for up to 3 days for severe pain.   rosuvastatin 40 MG tablet Commonly known as: CRESTOR TAKE ONE TABLET BY MOUTH EVERY NIGHT AT BEDTIME   sertraline 50 MG tablet Commonly known as: ZOLOFT Take 50 mg by mouth in the morning and at bedtime.   spironolactone 25 MG tablet Commonly known as: ALDACTONE TAKE ONE TABLET BY MOUTH DAILY   traMADol 50 MG tablet Commonly known as: ULTRAM Take 1 tablet (50 mg total) by mouth every 6 (six) hours as needed. What changed: reasons to take this       Allergies  Allergen  Reactions   Amlodipine Swelling    Swelling of ankles    Follow-up Information     [name], MD. Go on 05/15/2021.   Specialty: Internal Medicine Why: @2 :45pm Contact information: [address], [address] [address] [phone]         Sueanne Margarita, MD .   Specialty: Cardiology Contact information: [phone] [address] [place] [address] [phone]         [name], MD. Call in 1 day(s).   Specialty: Oncology Why: Please call for a posthospital follow-up appointment. Contact information: [address] [place] [address] [phone]                  The results of significant diagnostics from this hospitalization (including imaging, microbiology, ancillary and laboratory) are listed below  for reference.    Significant Diagnostic Studies: DG Chest 2 View  Result Date: 05/04/2021 CLINICAL DATA:  Chest pain EXAM: CHEST - 2 VIEW COMPARISON:  01/10/2021 FINDINGS: Blunting of left costophrenic angle, likely small effusion. Scarring in the left lung base. Right base atelectasis. Heart is normal size. Prior CABG. No acute bony abnormality. IMPRESSION: Small left pleural effusion. Left basilar scarring and right base atelectasis. Electronically Signed   By: Rolm Baptise M.D.   On: 05/04/2021 17:16   CT HEAD WO CONTRAST (5MM)  Result Date: 05/04/2021 CLINICAL DATA:  Altered mental status EXAM: CT HEAD WITHOUT CONTRAST TECHNIQUE: Contiguous axial images were obtained from the base of the skull through the vertex without intravenous contrast. COMPARISON:  None. FINDINGS: Brain: Mild age related volume loss. No acute intracranial abnormality. Specifically, no hemorrhage, hydrocephalus, mass lesion, acute infarction, or significant intracranial injury. Vascular: No hyperdense vessel or unexpected calcification. Skull: No acute calvarial abnormality. Sinuses/Orbits: No acute findings Other: None IMPRESSION: No acute intracranial abnormality. Electronically Signed   By: Rolm Baptise M.D.   On: 05/04/2021 19:23   CT Angio Chest PE W and/or Wo Contrast  Result Date: 05/04/2021 CLINICAL DATA:  History of lung carcinoma with chest pain, initial encounter EXAM: CT ANGIOGRAPHY CHEST WITH CONTRAST TECHNIQUE: Multidetector CT imaging of the chest was performed using the standard protocol during bolus administration of intravenous contrast. Multiplanar CT image reconstructions and MIPs were obtained to evaluate the vascular anatomy. CONTRAST:  66mL OMNIPAQUE IOHEXOL 350 MG/ML SOLN COMPARISON:  04/23/2021 PET-CT FINDINGS: Cardiovascular: Atherosclerotic calcifications of the thoracic aorta are noted without aneurysmal dilatation or dissection. Changes of prior coronary bypass grafting are seen. Mild cardiac  enlargement is noted. Coronary calcifications are identified as well. Pulmonary artery is well visualized with evidence of filling defect within right lower and middle lobe branches consistent with pulmonary embolus. No right heart strain is noted. No pericardial effusion is seen. Mediastinum/Nodes: Thoracic inlet is within normal limits. No sizable hilar or mediastinal adenopathy is noted. Small stable lymph nodes are seen. Calcified lymph nodes are noted within the right hila and subcarinal region consistent with prior granulomatous disease. The esophagus as visualized is within normal limits. Lungs/Pleura: Changes of prior left upper lobectomy are noted with volume loss identified. Some scarring is noted in the left lower lobe. There remains a pleural based density stable in appearance from the prior PET-CT which was mildly hypermetabolic on prior exam. Right lung shows stable nodules in the right upper lobe as well as within the right lower lobe similar to that seen on prior CT examination. Calcified granulomas are noted as well. Some  mild focal infiltrate is seen within the right middle lobe likely sequelae from the pulmonary emboli. A few scattered smaller nodules are noted also stable in appearance from the prior CT. Upper Abdomen: Visualized upper abdomen again shows a large right adrenal mass similar to that seen on prior PET-CT. Known left adrenal mass is not well visualized on this exam.No other upper abdominal abnormality is seen. Musculoskeletal: Degenerative changes of the thoracic spine are noted. Lytic lesion is noted in the left half of the T1 vertebra similar to that seen on prior PET-CT. Pathologic rib fracture is noted in the right fourth rib laterally which corresponds to a hypermetabolic lesion seen on prior PET-CT. No other definitive rib fractures are seen. The known T10 vertebral body lesion is again seen and stable from the prior PET-CT. Review of the MIP images confirms the above findings.  IMPRESSION: Findings consistent with pulmonary embolism involving the right lower lobe and right middle lobe branches. No right heart strain is noted. Some associated new airspace opacity is noted in the right middle lobe likely related to the underlying embolus. No definitive infarct is noted. Changes consistent with prior left upper lobectomy consistent with the given clinical history. Scattered pulmonary nodules are noted unchanged from recent PET-CT from 04/23/2021. Multiple bony metastatic lesions with a pathologic fracture involving the right fourth rib laterally. Stable appearing large right adrenal lesion consistent with metastatic disease. Aortic Atherosclerosis (ICD10-I70.0). Critical Value/emergent results were called by telephone at the time of interpretation on 05/04/2021 at 7:34 pm to [facility] , who verbally acknowledged these results. Electronically Signed   By: Inez Catalina M.D.   On: 05/04/2021 19:35   MR Brain W Wo Contrast  Result Date: 05/05/2021 CLINICAL DATA:  Non-small cell lung cancer, metastatic, assess for CNS metastasis EXAM: MRI HEAD WITHOUT AND WITH CONTRAST TECHNIQUE: Multiplanar, multiecho pulse sequences of the brain and surrounding structures were obtained without and with intravenous contrast. CONTRAST:  24mL GADAVIST GADOBUTROL 1 MMOL/ML IV SOLN COMPARISON:  06/22/2020. FINDINGS: Brain: No acute infarction, hemorrhage, hydrocephalus, extra-axial collection, or mass lesion. The ventricles and sulci are commensurate with degree of volume loss. No abnormal enhancement. Vascular: Normal flow voids. Skull and upper cervical spine: Normal marrow signal. No abnormal enhancement. Sinuses/Orbits: Mucous retention cyst mucosal thickening in the maxillary sinuses and left greater than right ethmoid air cells. The orbits are unremarkable. Other: The mastoids are well aerated. IMPRESSION: No acute intracranial process. No evidence of metastatic disease in the brain. Electronically  Signed   By: Merilyn Baba M.D.   On: 05/05/2021 20:06   MR THORACIC SPINE W WO CONTRAST  Result Date: 05/05/2021 CLINICAL DATA:  Non-small cell lung cancer, metastatic EXAM: MRI THORACIC AND LUMBAR SPINE WITHOUT AND WITH CONTRAST TECHNIQUE: Multiplanar and multiecho pulse sequences of the thoracic and lumbar spine were obtained without and with intravenous contrast. CONTRAST:  18mL GADAVIST GADOBUTROL 1 MMOL/ML IV SOLN COMPARISON:  No prior MRI, correlation is made with PET scan 04/23/2021 and 11/25/2020 and 04/26/2020 CT chest FINDINGS: MRI THORACIC SPINE FINDINGS Alignment: S shaped curvature of the thoracolumbar spine. No significant listhesis. Vertebrae: Decreased T1 signal, increased T2 signal, and contrast enhancement in the left aspect of the T1 and right aspect of the T10 vertebral bodies (series 24, image 9). Enhancement extends slightly into the right pedicle at T10. A focus of contrast is noted in the left facet at T1 (series 24, image 14). No epidural extension of tumor no evidence of pathologic fracture. Mild wedging of  the T1 vertebral body appears degenerative and unchanged compared to 04/26/2020. Additional T1 and T2 hyperintense foci in multiple vertebral bodies are consistent with benign hemangiomas. Cord:  Normal signal and morphology.  No abnormal enhancement. Paraspinal and other soft tissues: Evaluation of the lungs is limited by motion artifact. The paraspinal tissues are unremarkable. Disc levels: No high-grade spinal canal stenosis. Intervertebral discs are largely preserved. Two MRI LUMBAR SPINE FINDINGS Segmentation:  Standard. Alignment:  Physiologic. Vertebrae: Enhancing lesion in the left iliac bone, adjacent to the sacroiliac joint (series 13, image 47), measuring up to 2.2 x 1.2 cm in the axial plane but not included on the sagittal images given the field of view this study. Vertebral body heights are preserved. No other suspicious lesion. Conus medullaris: Extends to the L1  level and appears normal. No abnormal enhancement. Paraspinal and other soft tissues: Evaluation is somewhat limited by motion artifact. Small renal cysts. No significant pelvic lymphadenopathy is seen. Disc levels: T12-L4: No significant disc bulge. No spinal canal stenosis or neural foraminal narrowing. L4-L5: Disc desiccation and broad-based disc bulge. Mild facet arthropathy. No spinal canal stenosis. Narrowing of the bilateral lateral recesses. Mild bilateral neural foraminal narrowing, left greater than right. L5-S1: No significant disc bulge. No spinal canal stenosis or neural foraminal narrowing. IMPRESSION: 1. Abnormal signal and contrast enhancement in the T1 and T10 vertebral bodies, concerning for metastatic disease. No evidence of pathologic fracture or epidural extension of tumor. 2. Abnormal signal and contrast enhancement in the left iliac bone, adjacent the sacroiliac joint, also concerning for metastatic disease, which measures up to 2.2 cm in the axial plane but is not included on the sagittal images given the focus of this study. If further evaluation is clinically warranted, consider an MRI of the sacrum. 3. No spinal canal stenosis or high-grade neural foraminal narrowing. Electronically Signed   By: Merilyn Baba M.D.   On: 05/05/2021 20:01   MR Lumbar Spine W Wo Contrast  Result Date: 05/05/2021 CLINICAL DATA:  Non-small cell lung cancer, metastatic EXAM: MRI THORACIC AND LUMBAR SPINE WITHOUT AND WITH CONTRAST TECHNIQUE: Multiplanar and multiecho pulse sequences of the thoracic and lumbar spine were obtained without and with intravenous contrast. CONTRAST:  18mL GADAVIST GADOBUTROL 1 MMOL/ML IV SOLN COMPARISON:  No prior MRI, correlation is made with PET scan 04/23/2021 and 11/25/2020 and 04/26/2020 CT chest FINDINGS: MRI THORACIC SPINE FINDINGS Alignment: S shaped curvature of the thoracolumbar spine. No significant listhesis. Vertebrae: Decreased T1 signal, increased T2 signal, and  contrast enhancement in the left aspect of the T1 and right aspect of the T10 vertebral bodies (series 24, image 9). Enhancement extends slightly into the right pedicle at T10. A focus of contrast is noted in the left facet at T1 (series 24, image 14). No epidural extension of tumor no evidence of pathologic fracture. Mild wedging of the T1 vertebral body appears degenerative and unchanged compared to 04/26/2020. Additional T1 and T2 hyperintense foci in multiple vertebral bodies are consistent with benign hemangiomas. Cord:  Normal signal and morphology.  No abnormal enhancement. Paraspinal and other soft tissues: Evaluation of the lungs is limited by motion artifact. The paraspinal tissues are unremarkable. Disc levels: No high-grade spinal canal stenosis. Intervertebral discs are largely preserved. Two MRI LUMBAR SPINE FINDINGS Segmentation:  Standard. Alignment:  Physiologic. Vertebrae: Enhancing lesion in the left iliac bone, adjacent to the sacroiliac joint (series 13, image 47), measuring up to 2.2 x 1.2 cm in the axial plane but not included on the  sagittal images given the field of view this study. Vertebral body heights are preserved. No other suspicious lesion. Conus medullaris: Extends to the L1 level and appears normal. No abnormal enhancement. Paraspinal and other soft tissues: Evaluation is somewhat limited by motion artifact. Small renal cysts. No significant pelvic lymphadenopathy is seen. Disc levels: T12-L4: No significant disc bulge. No spinal canal stenosis or neural foraminal narrowing. L4-L5: Disc desiccation and broad-based disc bulge. Mild facet arthropathy. No spinal canal stenosis. Narrowing of the bilateral lateral recesses. Mild bilateral neural foraminal narrowing, left greater than right. L5-S1: No significant disc bulge. No spinal canal stenosis or neural foraminal narrowing. IMPRESSION: 1. Abnormal signal and contrast enhancement in the T1 and T10 vertebral bodies, concerning for  metastatic disease. No evidence of pathologic fracture or epidural extension of tumor. 2. Abnormal signal and contrast enhancement in the left iliac bone, adjacent the sacroiliac joint, also concerning for metastatic disease, which measures up to 2.2 cm in the axial plane but is not included on the sagittal images given the focus of this study. If further evaluation is clinically warranted, consider an MRI of the sacrum. 3. No spinal canal stenosis or high-grade neural foraminal narrowing. Electronically Signed   By: Merilyn Baba M.D.   On: 05/05/2021 20:01   NM PET Image Restag (PS) Skull Base To Thigh  Result Date: 04/25/2021 CLINICAL DATA:  Subsequent treatment strategy for non-small cell lung cancer. EXAM: NUCLEAR MEDICINE PET SKULL BASE TO THIGH TECHNIQUE: 10.0 mCi F-18 FDG was injected intravenously. Full-ring PET imaging was performed from the skull base to thigh after the radiotracer. CT data was obtained and used for attenuation correction and anatomic localization. Fasting blood glucose: 98 mg/dl COMPARISON:  Multiple exams, including 11/25/2020 FINDINGS: Mediastinal blood pool activity: SUV max 2.6 Liver activity: SUV max NA NECK: Substantial activity in the anterior tongue with maximum SUV 13.4 (formerly 14.7, no underlying CT abnormality, most likely physiologic. Incidental CT findings: Mild bilateral chronic maxillary sinusitis. Bilateral common carotid atherosclerotic calcification. CHEST: A new 0.9 by 0.7 cm right lower lobe pulmonary nodule adjacent to the diaphragm on image 44 series 8 has a maximum SUV of 2.5. A new 0.6 by 0.4 cm right upper lobe nodule on image 14 series 8 has maximum SUV only 1.3 but is below sensitive PET-CT size thresholds. Other even smaller new nodularity in the right upper lobe noted including a 0.5 by 0.3 cm nodule on image 11 series 8. Calcified nodule in the posterior basal segment right lower lobe not hypermetabolic. The reduction in the left pleural effusion is  noted with a peripheral area of probable rounded atelectasis in the left lower lobe measuring 2.7 by 1.2 cm on image 56 series 8 with maximum SUV 2.8. Malignancy is a differential diagnostic consideration. Incidental CT findings: Left upper lobectomy. Old granulomatous disease. Mild cardiomegaly. Coronary, aortic arch, and branch vessel atherosclerotic vascular disease. Prior CABG. ABDOMEN/PELVIS: New right adrenal mass 8.8 by 4.9 cm on image 107 series 4, maximum SUV 20.9, compatible with metastatic lesion. This indents the margin of the liver. New left adrenal mass, 1.9 by 2.1 cm, maximum SUV 22.4, compatible with malignancy. Accentuated bowel activity without CT correlate, likely physiologic, particularly in the right colon. Incidental CT findings: Old granulomatous disease. Atherosclerosis is present, including aortoiliac atherosclerotic disease. Sigmoid colon diverticulosis. SKELETON: Scattered new osseous metastatic lesions are identified including the T1 vertebra, right scapula, right fourth rib, left seventh rib, right T10 vertebral body, and left iliac bone adjacent to the SI  joint. Index lesion eccentric to the right at the T10 vertebral level has maximum SUV of 10.9 and is predominantly lytic with a mild sclerotic rim. Incidental CT findings: none IMPRESSION: 1. New metastatic disease including the right lung, bilateral adrenal glands, and multiple locations in the skeleton as noted above. 2. Prior left upper lobectomy with some evolutionary findings in the left lung including reduced left pleural effusion and resolution of a region of probable rounded atelectasis in the left lower lobe (less likely to be tumor). 3. Aortic Atherosclerosis (ICD10-I70.0). Coronary atherosclerosis. Sigmoid colon diverticulosis. Chronic bilateral maxillary sinusitis. Old granulomatous disease. Prior CABG. Mild cardiomegaly. Electronically Signed   By: Van Clines M.D.   On: 04/25/2021 07:22   MR CARDIAC MORPHOLOGY W  WO CONTRAST  Result Date: 04/18/2021 CLINICAL DATA:  Cardiomyopathy evaluation EXAM: CARDIAC MRI TECHNIQUE: The patient was scanned on a 1.5 Tesla Siemens magnet. A dedicated cardiac coil was used. Functional imaging was done using Fiesta sequences. 2,3, and 4 chamber views were done to assess for RWMA's. Modified Simpson's rule using a short axis stack was used to calculate an ejection fraction on a dedicated work Conservation officer, nature. The patient received 9 cc of Gadavist. After 10 minutes inversion recovery sequences were used to assess for infiltration and scar tissue. CONTRAST:  9 cc  of Gadavist FINDINGS: Left ventricle: -Mild dilatation -Moderate systolic dysfunction. Significant thinning/dyskinesis of focal region of basal to mid lateral wall -Nonspecific ECV elevation (31%) -Basal septal midwall LGE -Subendocardial LGE in focal region of basal to mid lateral wall. LGE >50% transmural -RV insertion site LGE LV EF: 39% (Normal 56-78%) Absolute volumes: LV EDV: 235mL (Normal 77-195 mL) LV ESV: 120mL (Normal 19-72 mL) LV SV: 34mL (Normal 51-133 mL) CO: 5.9L/min (Normal 2.8-8.8 L/min) Indexed volumes: LV EDV: 182mL/sq-m (Normal 47-92 mL/sq-m) LV ESV: 68mL/sq-m (Normal 13-30 mL/sq-m) LV SV: 67mL/sq-m (Normal 32-62 mL/sq-m) CI: 2.7L/min/sq-m (Normal 1.7-4.2 L/min/sq-m) Right ventricle: Normal size with mild systolic dysfunction RV EF:  42% (Normal 47-74%) Absolute volumes: RV EDV: 260mL (Normal 88-227 mL) RV ESV: 147mL (Normal 23-103 mL) RV SV: 25mL (Normal 52-138 mL) CO: 5.9L/min (Normal 2.8-8.8 L/min) Indexed volumes: RV EDV: 166mL/sq-m (Normal 55-105 mL/sq-m) RV ESV: 31mL/sq-m (Normal 15-43 mL/sq-m) RV SV: 34mL/sq-m (Normal 32-64 mL/sq-m) CI: 2.8L/min/sq-m (Normal 1.7-4.2 L/min/sq-m) Left atrium: Moderate enlargement Right atrium: Mild enlargement Mitral valve: Trivial regurgitation Aortic valve: Tricuspid. No regurgitation Tricuspid valve: Trivial regurgitation Pulmonic valve: No regurgitation  Aorta: Normal proximal ascending aorta Pericardium: Normal IMPRESSION: 1. Mild LV dilatation with moderate systolic dysfunction (EF 65%). Significant thinning/dyskinesis of focal region of basal to mid lateral wall 2. 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 Subendocardial late gadolinium enhancement consistent with prior infarct in focal region of basal to mid lateral wall. LGE is greater than 50% transmural suggesting this region is not viable 4. Basal septal midwall LGE, which is a scar pattern seen in nonischemic cardiomyopathies and associated with worse prognosis 5. RV insertion site LGE, which is a nonspecific scar pattern often seen in setting of elevated pulmonary pressures 6.  Normal RV size with mild systolic dysfunction (EF 46%) Electronically Signed   By: Oswaldo Milian M.D.   On: 04/18/2021 22:17   ECHOCARDIOGRAM COMPLETE  Result Date: 05/05/2021    ECHOCARDIOGRAM REPORT   Patient Name:   John Hinton Date of Exam: 05/05/2021 Medical Rec #:  [id number]        Height:       71.0 in Accession #:  [id number]       Weight:       200.0 lb Date of Birth:  1946/02/04        BSA:          2.109 m Patient Age:    81 years         BP:           109/64 mmHg Patient Gender: M                HR:           80 bpm. Exam Location:  Inpatient Procedure: 2D Echo, Cardiac Doppler and Color Doppler Indications:    Pulmonary embolus  History:        Patient has prior history of Echocardiogram examinations, most                 recent 02/28/2020. Cardiomyopathy, CAD, Prior CABG,                 Arrythmias:RBBB; Risk Factors:Hypertension, Dyslipidemia and                 Sleep Apnea.  Sonographer:    Clayton Lefort RDCS (AE) Referring Phys: [place]  1. Left ventricular ejection fraction, by estimation, is 45 to 50%. The left ventricle has mildly decreased function. The left ventricle has no regional wall motion abnormalities. There is mild left  ventricular hypertrophy. Left ventricular diastolic parameters are consistent with Grade I diastolic dysfunction (impaired relaxation). There is septal bounce.  2. Right ventricular systolic function is normal. The right ventricular size is moderately enlarged. There is normal pulmonary artery systolic pressure. The estimated right ventricular systolic pressure is 79.0 mmHg.  3. Left atrial size was mildly dilated.  4. The mitral valve is normal in structure. Mild mitral valve regurgitation. No evidence of mitral stenosis.  5. The aortic valve is tricuspid. There is moderate calcification of the aortic valve. There is moderate thickening of the aortic valve. Aortic valve regurgitation is not visualized. Mild to moderate aortic valve sclerosis/calcification is present, without any evidence of aortic stenosis.  6. The inferior vena cava is normal in size with greater than 50% respiratory variability, suggesting right atrial pressure of 3 mmHg. FINDINGS  Left Ventricle: Left ventricular ejection fraction, by estimation, is 45 to 50%. The left ventricle has mildly decreased function. The left ventricle has no regional wall motion abnormalities. The left ventricular internal cavity size was normal in size. There is mild left ventricular hypertrophy. Septal bounce. Left ventricular diastolic parameters are consistent with Grade I diastolic dysfunction (impaired relaxation). Right Ventricle: The right ventricular size is moderately enlarged. No increase in right ventricular wall thickness. Right ventricular systolic function is normal. There is normal pulmonary artery systolic pressure. The tricuspid regurgitant velocity is 2.79 m/s, and with an assumed right atrial pressure of 3 mmHg, the estimated right ventricular systolic pressure is 38.3 mmHg. Left Atrium: Left atrial size was mildly dilated. Right Atrium: Right atrial size was normal in size. Pericardium: There is no evidence of pericardial effusion. Mitral Valve:  The mitral valve is normal in structure. Mild mitral valve regurgitation. No evidence of mitral valve stenosis. MV peak gradient, 4.1 mmHg. The mean mitral valve gradient is 1.0 mmHg. Tricuspid Valve: The tricuspid valve is normal in structure. Tricuspid valve regurgitation is mild . No evidence of tricuspid stenosis. Aortic Valve: The aortic valve is tricuspid. There is moderate calcification of the aortic valve. There is moderate thickening of  the aortic valve. Aortic valve regurgitation is not visualized. Mild to moderate aortic valve sclerosis/calcification is present, without any evidence of aortic stenosis. Aortic valve mean gradient measures 5.0 mmHg. Aortic valve peak gradient measures 8.5 mmHg. Aortic valve area, by VTI measures 2.90 cm. Pulmonic Valve: The pulmonic valve was normal in structure. Pulmonic valve regurgitation is not visualized. No evidence of pulmonic stenosis. Aorta: The aortic root is normal in size and structure. Venous: The inferior vena cava is normal in size with greater than 50% respiratory variability, suggesting right atrial pressure of 3 mmHg. IAS/Shunts: No atrial level shunt detected by color flow Doppler.  LEFT VENTRICLE PLAX 2D LVIDd:         5.40 cm   Diastology LVIDs:         3.70 cm   LV e' medial:    4.24 cm/s LV PW:         1.20 cm   LV E/e' medial:  11.3 LV IVS:        1.30 cm   LV e' lateral:   7.72 cm/s LVOT diam:     2.30 cm   LV E/e' lateral: 6.2 LV SV:         72 LV SV Index:   34 LVOT Area:     4.15 cm  RIGHT VENTRICLE             IVC RV Basal diam:  3.40 cm     IVC diam: 0.80 cm RV S prime:     10.30 cm/s TAPSE (M-mode): 1.6 cm LEFT ATRIUM           Index        RIGHT ATRIUM           Index LA diam:      4.20 cm 1.99 cm/m   RA Area:     21.40 cm LA Vol (A2C): 87.1 ml 41.31 ml/m  RA Volume:   65.10 ml  30.87 ml/m LA Vol (A4C): 66.3 ml 31.44 ml/m  AORTIC VALVE AV Area (Vmax):    2.87 cm AV Area (Vmean):   2.43 cm AV Area (VTI):     2.90 cm AV Vmax:            146.00 cm/s AV Vmean:          105.000 cm/s AV VTI:            0.249 m AV Peak Grad:      8.5 mmHg AV Mean Grad:      5.0 mmHg LVOT Vmax:         101.00 cm/s LVOT Vmean:        61.300 cm/s LVOT VTI:          0.174 m LVOT/AV VTI ratio: 0.70  AORTA Ao Root diam: 3.70 cm Ao Asc diam:  3.70 cm MITRAL VALVE               TRICUSPID VALVE MV Area (PHT): 3.24 cm    TR Peak grad:   31.1 mmHg MV Area VTI:   2.98 cm    TR Vmax:        279.00 cm/s MV Peak grad:  4.1 mmHg MV Mean grad:  1.0 mmHg    SHUNTS MV Vmax:       1.01 m/s    Systemic VTI:  0.17 m MV Vmean:      51.9 cm/s   Systemic Diam: 2.30 cm MV Decel Time: 234 msec MV E velocity:  48.00 cm/s MV A velocity: 86.10 cm/s MV E/A ratio:  0.56 Candee Furbish MD Electronically signed by Candee Furbish MD Signature Date/Time: 05/05/2021/1:48:53 PM    Final    VAS [place] LOWER EXTREMITY VENOUS (DVT)  Result Date: 05/05/2021  Lower Venous DVT Study Patient Name:  John Hinton  Date of Exam:   05/05/2021 Medical Rec #: [id number]         Accession #:    [id number] Date of Birth: 1945-09-07         Patient Gender: M Patient Age:   78 years Exam Location:  [facility] Procedure:      VAS [place] LOWER EXTREMITY VENOUS (DVT) Referring Phys: Jennette Kettle --------------------------------------------------------------------------------  Indications: Pulmonary embolism.  Risk Factors: Cancer lung cancer. Limitations: (88/72) Comparison Study: no prior study Performing Technologist: Sharion Dove RVS  Examination Guidelines: A complete evaluation includes B-mode imaging, spectral Doppler, color Doppler, and power Doppler as needed of all accessible portions of each vessel. Bilateral testing is considered an integral part of a complete examination. Limited examinations for reoccurring indications may be performed as noted. The reflux portion of the exam is performed with the patient in reverse Trendelenburg.  +---------+---------------+---------+-----------+----------+--------------+  RIGHT    CompressibilityPhasicitySpontaneityPropertiesThrombus Aging +---------+---------------+---------+-----------+----------+--------------+ CFV      Full           Yes      Yes                                 +---------+---------------+---------+-----------+----------+--------------+ SFJ      Full                                                        +---------+---------------+---------+-----------+----------+--------------+ FV Prox  Full                                                        +---------+---------------+---------+-----------+----------+--------------+ FV Mid   Full                                                        +---------+---------------+---------+-----------+----------+--------------+ FV DistalFull                                                        +---------+---------------+---------+-----------+----------+--------------+ PFV      Full                                                        +---------+---------------+---------+-----------+----------+--------------+ POP      Full           Yes                                          +---------+---------------+---------+-----------+----------+--------------+  PTV      Full                                                        +---------+---------------+---------+-----------+----------+--------------+ PERO     Full                                                        +---------+---------------+---------+-----------+----------+--------------+   +---------+---------------+---------+-----------+----------+--------------+ LEFT     CompressibilityPhasicitySpontaneityPropertiesThrombus Aging +---------+---------------+---------+-----------+----------+--------------+ CFV      Full           Yes      Yes                                 +---------+---------------+---------+-----------+----------+--------------+ SFJ      Full                                                         +---------+---------------+---------+-----------+----------+--------------+ FV Prox  Full                                                        +---------+---------------+---------+-----------+----------+--------------+ FV Mid   Full                                                        +---------+---------------+---------+-----------+----------+--------------+ FV DistalFull                                                        +---------+---------------+---------+-----------+----------+--------------+ PFV      Full                                                        +---------+---------------+---------+-----------+----------+--------------+ POP      Full           Yes      No                                  +---------+---------------+---------+-----------+----------+--------------+ PTV      Full                                                        +---------+---------------+---------+-----------+----------+--------------+  PERO     Full                                                        +---------+---------------+---------+-----------+----------+--------------+ Gastroc  Full                                                        +---------+---------------+---------+-----------+----------+--------------+     Summary: BILATERAL: - No evidence of deep vein thrombosis seen in the lower extremities, bilaterally. - RIGHT: - There is no evidence of deep vein thrombosis in the lower extremity.   *See table(s) above for measurements and observations. Electronically signed by Servando Snare MD on 05/05/2021 at 4:34:39 PM.    Final     Microbiology: Recent Results (from the past 240 hour(s))  Blood culture (routine x 2)     Status: None (Preliminary result)   Collection Time: 05/04/21  6:19 PM   Specimen: BLOOD  Result Value Ref Range Status   Specimen Description BLOOD RIGHT ANTECUBITAL  Final   Special Requests   Final     BOTTLES DRAWN AEROBIC AND ANAEROBIC Blood Culture adequate volume   Culture   Final    NO GROWTH 2 DAYS Performed at [facility], [address], [place], [place] [address]    Report Status PENDING  Incomplete  Resp Panel by RT-PCR (Flu A&B, Covid) Nasopharyngeal Swab     Status: None   Collection Time: 05/04/21  6:25 PM   Specimen: Nasopharyngeal Swab; Nasopharyngeal(NP) swabs in vial transport medium  Result Value Ref Range Status   SARS Coronavirus 2 by RT PCR NEGATIVE NEGATIVE Final    Comment: (NOTE) SARS-CoV-2 target nucleic acids are NOT DETECTED.  The SARS-CoV-2 RNA is generally detectable in upper respiratory specimens during the acute phase of infection. The lowest concentration of SARS-CoV-2 viral copies this assay can detect is 138 copies/mL. A negative result does not preclude SARS-Cov-2 infection and should not be used as the sole basis for treatment or other patient management decisions. A negative result may occur with  improper specimen collection/handling, submission of specimen other than nasopharyngeal swab, presence of viral mutation(s) within the areas targeted by this assay, and inadequate number of viral copies(<138 copies/mL). A negative result must be combined with clinical observations, patient history, and epidemiological information. The expected result is Negative.  Fact Sheet for Patients:  EntrepreneurPulse.com.au  Fact Sheet for Healthcare Providers:  IncredibleEmployment.be  This test is no t yet approved or cleared by the [place] FDA and  has been authorized for detection and/or diagnosis of SARS-CoV-2 by FDA under an Emergency Use Authorization (EUA). This EUA will remain  in effect (meaning this test can be used) for the duration of the COVID-19 declaration under Section 564(b)(1) of the Act, 21 U.S.C.section 360bbb-3(b)(1), unless the authorization is terminated  or revoked sooner.        Influenza A by PCR NEGATIVE NEGATIVE Final   Influenza B by PCR NEGATIVE NEGATIVE Final    Comment: (NOTE) The Xpert Xpress SARS-CoV-2/FLU/RSV plus assay is intended as an aid in the diagnosis of influenza from Nasopharyngeal swab specimens and should not be used as  a sole basis for treatment. Nasal washings and aspirates are unacceptable for Xpert Xpress SARS-CoV-2/FLU/RSV testing.  Fact Sheet for Patients: EntrepreneurPulse.com.au  Fact Sheet for Healthcare Providers: IncredibleEmployment.be  This test is not yet approved or cleared by the [place] FDA and has been authorized for detection and/or diagnosis of SARS-CoV-2 by FDA under an Emergency Use Authorization (EUA). This EUA will remain in effect (meaning this test can be used) for the duration of the COVID-19 declaration under Section 564(b)(1) of the Act, 21 U.S.C. section 360bbb-3(b)(1), unless the authorization is terminated or revoked.  Performed at [facility], [place] [address] [place], [place] [address]   Blood culture (routine x 2)     Status: None (Preliminary result)   Collection Time: 05/04/21  7:35 PM   Specimen: BLOOD  Result Value Ref Range Status   Specimen Description BLOOD SITE NOT SPECIFIED  Final   Special Requests   Final    BOTTLES DRAWN AEROBIC AND ANAEROBIC Blood Culture adequate volume   Culture   Final    NO GROWTH 2 DAYS Performed at [place] [address] [place], [place] [address]    Report Status PENDING  Incomplete     Labs: Basic Metabolic Panel: Recent Labs  Lab 05/04/21 1646 05/04/21 2130 05/05/21 0438 05/06/21 0252  NA 135 134* 132*  --   K 4.1 4.1 4.1 4.2  CL 100  --  100  --   CO2 24  --  22  --   GLUCOSE 109*  --  99  --   BUN 20  --  24*  --   CREATININE 0.93  --  1.00  --   CALCIUM 9.5  --  8.6*  --   MG  --   --   --  2.1   Liver Function Tests: Recent Labs  Lab 05/04/21 1646  AST 20  ALT 19  ALKPHOS  80  BILITOT 0.7  PROT 7.8  ALBUMIN 3.4*   No results for input(s): LIPASE, AMYLASE in the last 168 hours. Recent Labs  Lab 05/04/21 1646  AMMONIA <10   CBC: Recent Labs  Lab 05/04/21 1646 05/04/21 2130 05/05/21 0438 05/06/21 0252  WBC 9.1  --  8.4 7.8  NEUTROABS 7.4  --   --   --   HGB 11.7* 11.9* 9.8* 10.1*  HCT 36.6* 35.0* 30.9* 31.1*  MCV 96.1  --  95.1 94.5  PLT 267  --  261 283   Cardiac Enzymes: No results for input(s): CKTOTAL, CKMB, CKMBINDEX, TROPONINI in the last 168 hours. BNP: BNP (last 3 results) Recent Labs    05/04/21 1651  BNP 143.7*    ProBNP (last 3 results) No results for input(s): PROBNP in the last 8760 hours.  CBG: No results for input(s): GLUCAP in the last 168 hours.     Signed:  Kayleen Memos, MD [facility] 05/06/2021, 5:52 PM

## 2021-05-06 NOTE — Progress Notes (Signed)
ANTICOAGULATION CONSULT NOTE - Follow Up Consult  Pharmacy Consult for heparin Indication: pulmonary embolus  Labs: Recent Labs    05/04/21 1646 05/04/21 1935 05/04/21 2130 05/05/21 0438 05/05/21 1626 05/06/21 0252  HGB 11.7*  --  11.9* 9.8*  --  10.1*  HCT 36.6*  --  35.0* 30.9*  --  31.1*  PLT 267  --   --  261  --  283  HEPARINUNFRC  --   --   --  <0.10* 0.19* 0.16*  CREATININE 0.93  --   --  1.00  --   --   TROPONINIHS 13 14  --   --   --   --     Assessment: 75yo male subtherapeutic on heparin with lower heparin level despite increased rate; no infusion issues or signs of bleeding per RN.  Goal of Therapy:  Heparin level 0.3-0.7 units/ml   Plan:  Will rebolus with heparin 3000 units and increase heparin infusion by 20% to 2400 units/hr and check level in 6 hours.    Wynona Neat, PharmD, BCPS  05/06/2021,4:01 AM

## 2021-05-06 NOTE — Progress Notes (Signed)
Pt's wife wishes to speak with Oncology MD, but arrived shortly after Dr. Marin Olp rounded this morning.

## 2021-05-06 NOTE — TOC Benefit Eligibility Note (Signed)
Patient Teacher, English as a foreign language completed.    The patient is currently admitted and upon discharge could be taking Eliquis 5 mg.  The current 30 day co-pay is, $28.86.   The patient is insured through [employer] [place], [place] Patient Advocate Specialist [place] Team Direct Number: [phone]  Fax: [phone]

## 2021-05-06 NOTE — Consult Note (Signed)
Cardiology Consultation:   Patient ID: John Hinton MRN: [id number]; DOB: 17-Dec-1945  Admit date: 05/04/2021 Date of Consult: 05/06/2021  PCP:  [name], MD   [facility] Providers Cardiologist:  Fransico Him, MD   Patient Profile:   John Hinton is a 75 y.o. male with a hx of CAD s/p CABG x 4 2007 wit subsequent PCI, OSA on CPAP, HTN, HLD, ICM/NICM, and lung cancer who is being seen 05/06/2021 for the evaluation of medication reconciliation at the request of Dr. Nevada Crane.  History of Present Illness:   [name] has a history of CAD s/p CABG x 4 in 2007, OSA on CPAP, HTN, HLD, and ischemic cardiomyopathy. Last angiography in 2018 with patent LIMA-LAD, SVG-D1, SVG-PDA, but severe instent restenosis in the SVG-OM treated with successful cutting balloon. He remained on ASA and plavix. Since that time, he had a nonischemic nuclear stress test in 02/2020. Echo in 02/2021 showed new reduction in LVEF to 40-45%, grade 1 DD, and dilation of aortic root to 40 mm. Given this, he was referred to cMRI which was completed on 04/17/21 and showed moderate systolic dysfunction (EF 96%) with features of both ischemic and nonischemic cardiomyopathy, with basal to mid lateral wall with scar deemed not viable. ACEI was stopped and he was started on low dose entresto. Repeat nuclear stress test was also planned.    He was diagnosed with stage 4a adenocarcinoma of the LUL s/p resection and chemo. He was deemed in remission and his SOB significantly improved. He recently underwent PET imaging which showed new metastatic disease.   He presented to [facility] with worsening shortness of breath and pleuritic chest pain found to have PE and started on eliquis. Cardiology was consulted for final medication recommendations.  During my interview, his daughter was at bedside. He reports that his pleuritic chest pain and dyspnea have significantly improved. He is not on any cardiac medications at this time. ASA has  been continued.   Past Medical History:  Diagnosis Date   BPH (benign prostatic hypertrophy)    Bradycardia    a. H/o asymptomatic bradycardia.   Coronary artery disease    a. s/p CABG ~2007 with LIMA to LAD, SVG seq to OM1 and OM2, SVG to PDA and SVG to diag. b. Abnormal nuc 03/2015 - s/p DES to SVG-OM1-OM2. 01/29/17 POBA with cutting balloon to SVG-->OM.  No ischemia Lexi myoview 2021   DCM (dilated cardiomyopathy) ([place]) 09/17/2015   mixed by cMRI 03/2021 with EF 39% with non viable infarct in basal to mid lateral wall and LGE pattern c/w nonischemic component as well. Mild RV dysfunction RVEF 42%.   Dyslipidemia    Dyspnea    r/t lung mass   ED (erectile dysfunction)    Fear of heights    Goals of care, counseling/discussion 06/19/2020   Hypertension    Non-small cell carcinoma of lung, stage 4, left ([place]) 06/19/2020   OSA (obstructive sleep apnea) 07/12/2015   Moderate OSA with AHI 16.5/hr now on CPAP at 8cm H2O, uses cpap nightly      RBBB    Shoulder, capsulitis, adhesive    Left Shoulder    Past Surgical History:  Procedure Laterality Date   APPENDECTOMY     BRONCHIAL BIOPSY  05/09/2020   Procedure: BRONCHIAL BIOPSIES;  Surgeon: [name], DO;  Location: [place] [facility];  Service: Pulmonary;;   BRONCHIAL BRUSHINGS  05/09/2020   Procedure: BRONCHIAL BRUSHINGS;  Surgeon: [name], DO;  Location: [place];  Service:  Pulmonary;;   BRONCHIAL NEEDLE ASPIRATION BIOPSY  05/09/2020   Procedure: BRONCHIAL NEEDLE ASPIRATION BIOPSIES;  Surgeon: [name], DO;  Location: [place] [facility];  Service: Pulmonary;;   BRONCHIAL WASHINGS  05/09/2020   Procedure: BRONCHIAL WASHINGS;  Surgeon: [name], DO;  Location: [place] [facility];  Service: Pulmonary;;   CARDIAC CATHETERIZATION N/A 04/05/2015   Procedure: Left Heart Cath and Coronary Angiography;  Surgeon: Sherren Mocha, MD;  Location: [place] [facility];  Service: Cardiovascular;  Laterality: N/A;   COLONOSCOPY      CORONARY ARTERY BYPASS GRAFT     w/LIMA to LAD, seq SVG to OM1 and OM2, SVG to PDA and SVG to Diagonal   CORONARY BALLOON ANGIOPLASTY N/A 01/29/2017   Procedure: Coronary Balloon Angioplasty;  Surgeon: Martinique, Peter M, MD;  Location: [place] [facility];  Service: Cardiovascular;  Laterality: N/A;   FIDUCIAL MARKER PLACEMENT  05/09/2020   Procedure: FIDUCIAL MARKER PLACEMENT;  Surgeon: [name], DO;  Location: [place] [facility];  Service: Pulmonary;;   INTERCOSTAL NERVE BLOCK Left 05/22/2020   Procedure: INTERCOSTAL NERVE BLOCK;  Surgeon: Lajuana Matte, MD;  Location: [place];  Service: Thoracic;  Laterality: Left;   LEFT HEART CATH AND CORS/GRAFTS ANGIOGRAPHY N/A 01/29/2017   Procedure: Left Heart Cath and Cors/Grafts Angiography;  Surgeon: Martinique, Peter M, MD;  Location: [place] [facility];  Service: Cardiovascular;  Laterality: N/A;   NODE DISSECTION N/A 05/22/2020   Procedure: NODE DISSECTION;  Surgeon: Lajuana Matte, MD;  Location: [place];  Service: Thoracic;  Laterality: N/A;   VIDEO BRONCHOSCOPY WITH ENDOBRONCHIAL NAVIGATION N/A 05/09/2020   Procedure: VIDEO BRONCHOSCOPY WITH ENDOBRONCHIAL NAVIGATION;  Surgeon: [name], DO;  Location: [place];  Service: Pulmonary;  Laterality: N/A;   VIDEO BRONCHOSCOPY WITH ENDOBRONCHIAL ULTRASOUND N/A 05/09/2020   Procedure: VIDEO BRONCHOSCOPY WITH ENDOBRONCHIAL ULTRASOUND;  Surgeon: [name], DO;  Location: [place];  Service: Pulmonary;  Laterality: N/A;     Home Medications:  Prior to Admission medications   Medication Sig Start Date End Date Taking? Authorizing Provider  acetaminophen (TYLENOL) 500 MG tablet Take 1,000 mg by mouth every 6 (six) hours as needed for moderate pain or headache.   Yes [provider]  APIXABAN Arne Cleveland) VTE STARTER PACK (10MG  AND 5MG ) Take as directed on package: start with two-5mg  tablets twice daily for 7 days. On day 8, switch to one-5mg  tablet twice daily. 05/06/21  Yes  Irene Pap N, DO  aspirin 81 MG tablet Take 81 mg by mouth at bedtime.    Yes [provider]  cholecalciferol (VITAMIN D) 1000 UNITS tablet Take 1,000 Units by mouth at bedtime.    Yes [provider]  clopidogrel (PLAVIX) 75 MG tablet Take 1 tablet (75 mg total) by mouth daily. 06/25/20  Yes Turner, Eber Hong, MD  Cyanocobalamin (B-12 PO) Take 1,000 mcg by mouth daily.    Yes [provider]  doxazosin (CARDURA) 2 MG tablet TAKE ONE TABLET BY MOUTH DAILY Patient taking differently: Take 2 mg by mouth daily. 03/19/21  Yes Turner, Eber Hong, MD  ezetimibe (ZETIA) 10 MG tablet Take 1 tablet (10 mg total) by mouth daily. Patient taking differently: Take 10 mg by mouth at bedtime. 06/25/20  Yes Turner, Eber Hong, MD  folic acid (FOLVITE) 924 MCG tablet Take 400 mcg by mouth daily.   Yes [provider]  ibuprofen (ADVIL) 200 MG tablet Take 400 mg by mouth every 6 (six) hours as needed for headache  or moderate pain.   Yes [provider]  Multiple Vitamin (MULTIVITAMIN) tablet Take 1 tablet by mouth daily.   Yes [provider]  nitroGLYCERIN (NITROSTAT) 0.4 MG SL tablet Place 1 tablet (0.4 mg total) under the tongue every 5 (five) minutes as needed for chest pain. 01/27/17  Yes Bhagat, Bhavinkumar, PA  oxyCODONE (ROXICODONE) 5 MG immediate release tablet Take 1 tablet (5 mg total) by mouth 2 (two) times daily as needed for up to 3 days for severe pain. 05/06/21 05/09/21 Yes Hall, Carole N, DO  rosuvastatin (CRESTOR) 40 MG tablet TAKE ONE TABLET BY MOUTH EVERY NIGHT AT BEDTIME Patient taking differently: Take 40 mg by mouth at bedtime. 03/19/21  Yes Turner, Eber Hong, MD  sacubitril-valsartan (ENTRESTO) 24-26 MG Take 1 tablet by mouth 2 (two) times daily. 04/25/21  Yes Turner, Eber Hong, MD  sertraline (ZOLOFT) 50 MG tablet Take 50 mg by mouth in the morning and at bedtime.    Yes [provider]  spironolactone (ALDACTONE) 25 MG tablet TAKE ONE TABLET  BY MOUTH DAILY Patient taking differently: Take 25 mg by mouth daily. 06/25/20  Yes Turner, Eber Hong, MD  traMADol (ULTRAM) 50 MG tablet Take 1 tablet (50 mg total) by mouth every 6 (six) hours as needed. Patient taking differently: Take 50 mg by mouth every 6 (six) hours as needed for moderate pain. 04/28/21  Yes [name], MD  sotorasib (LUMAKRAS) 120 MG TABS Take 8 tablets (960 mg) by mouth daily after breakfast. 05/06/21   [name], MD  prochlorperazine (COMPAZINE) 10 MG tablet Take 1 tablet (10 mg total) by mouth every 6 (six) hours as needed (Nausea or vomiting). 06/24/20 04/25/21  [name], MD    Inpatient Medications: Scheduled Meds:  apixaban  10 mg Oral BID   Followed by   Derrill Memo ON 05/13/2021] apixaban  5 mg Oral BID   aspirin EC  81 mg Oral QHS   ezetimibe  10 mg Oral QHS   folic acid  093 mcg Oral Daily   multivitamin with minerals  1 tablet Oral Daily   rosuvastatin  40 mg Oral QHS   sertraline  50 mg Oral BID   sotorasib  960 mg Oral QPC breakfast   Continuous Infusions:  PRN Meds: acetaminophen **OR** acetaminophen, morphine injection, ondansetron **OR** ondansetron (ZOFRAN) IV  Allergies:    Allergies  Allergen Reactions   Amlodipine Swelling    Swelling of ankles    Social History:   Social History   Socioeconomic History   Marital status: Married    Spouse name: Not on file   Number of children: Not on file   Years of education: Not on file   Highest education level: Not on file  Occupational History   Not on file  Tobacco Use   Smoking status: Former    Types: Cigarettes    Quit date: 07/21/1983    Years since quitting: 37.8   Smokeless tobacco: Former  Scientific laboratory technician Use: Never used  Substance and Sexual Activity   Alcohol use: Yes    Alcohol/week: 20.0 - 28.0 standard drinks    Types: 20 - 28 Standard drinks or equivalent per week    Comment: daily - 20-28 /week   Drug use: No   Sexual activity: Not Currently   Other Topics Concern   Not on file  Social History Narrative   Not on file   Social Determinants of Health   Financial  Resource Strain: Not on file  Food Insecurity: Not on file  Transportation Needs: Not on file  Physical Activity: Not on file  Stress: Not on file  Social Connections: Not on file  Intimate Partner Violence: Not on file    Family History:    Family History  Problem Relation Age of Onset   Lung cancer Mother    Lung cancer Father    Heart disease Father    Heart failure Father    Cancer Sister      ROS:  Please see the history of present illness.   All other ROS reviewed and negative.     Physical Exam/Data:   Vitals:   05/06/21 0035 05/06/21 0453 05/06/21 0751 05/06/21 1103  BP: 122/78 116/77 138/84 120/75  Pulse: 96 76 84 84  Resp: 16 16 19 18   Temp: 97.6 F (36.4 C) 98.3 F (36.8 C) 97.8 F (36.6 C) 98.6 F (37 C)  TempSrc: Oral Oral Oral Oral  SpO2: 99% 97% 100% 100%  Weight: 87.3 kg     Height:        Intake/Output Summary (Last 24 hours) at 05/06/2021 1328 Last data filed at 05/06/2021 1242 Gross per 24 hour  Intake 1262.14 ml  Output 250 ml  Net 1012.14 ml   Last 3 Weights 05/06/2021 05/05/2021 05/04/2021  Weight (lbs) 192 lb 6.4 oz 197 lb 1.5 oz 200 lb  Weight (kg) 87.272 kg 89.4 kg 90.719 kg     Body mass index is 26.83 kg/m.  General:  Well nourished, well developed, in no acute distress HEENT: normal Neck: no JVD Vascular: No carotid bruits; Distal pulses 2+ bilaterally Cardiac:  normal S1, S2; RRR; no murmur  Lungs:  clear to auscultation bilaterally, no wheezing, rhonchi or rales  Abd: soft, nontender, no hepatomegaly  Ext: no edema Musculoskeletal:  No deformities, BUE and BLE strength normal and equal Skin: warm and dry  Neuro:  CNs 2-12 intact, no focal abnormalities noted Psych:  Normal affect   EKG:  The EKG was personally reviewed and demonstrates:  ST, HR 112, RBBB Telemetry:  Telemetry was personally  reviewed and demonstrates:  sinus HR 70-80s, frequent PVCs and runs of NSVT  Relevant CV Studies:  Echo 05/05/21: 1. Left ventricular ejection fraction, by estimation, is 45 to 50%. The  left ventricle has mildly decreased function. The left ventricle has no  regional wall motion abnormalities. There is mild left ventricular  hypertrophy. Left ventricular diastolic  parameters are consistent with Grade I diastolic dysfunction (impaired  relaxation). There is septal bounce.   2. Right ventricular systolic function is normal. The right ventricular  size is moderately enlarged. There is normal pulmonary artery systolic  pressure. The estimated right ventricular systolic pressure is 19.6 mmHg.   3. Left atrial size was mildly dilated.   4. The mitral valve is normal in structure. Mild mitral valve  regurgitation. No evidence of mitral stenosis.   5. The aortic valve is tricuspid. There is moderate calcification of the  aortic valve. There is moderate thickening of the aortic valve. Aortic  valve regurgitation is not visualized. Mild to moderate aortic valve  sclerosis/calcification is present,  without any evidence of aortic stenosis.   6. The inferior vena cava is normal in size with greater than 50%  respiratory variability, suggesting right atrial pressure of 3 mmHg.   Laboratory Data:  High Sensitivity Troponin:   Recent Labs  Lab 05/04/21 1646 05/04/21 1935  TROPONINIHS 13 14  Chemistry Recent Labs  Lab 05/04/21 1646 05/04/21 2130 05/05/21 0438 05/06/21 0252  NA 135 134* 132*  --   K 4.1 4.1 4.1 4.2  CL 100  --  100  --   CO2 24  --  22  --   GLUCOSE 109*  --  99  --   BUN 20  --  24*  --   CREATININE 0.93  --  1.00  --   CALCIUM 9.5  --  8.6*  --   MG  --   --   --  2.1  GFRNONAA >60  --  >60  --   ANIONGAP 11  --  10  --     Recent Labs  Lab 05/04/21 1646  PROT 7.8  ALBUMIN 3.4*  AST 20  ALT 19  ALKPHOS 80  BILITOT 0.7   Lipids No results for  input(s): CHOL, TRIG, HDL, LABVLDL, LDLCALC, CHOLHDL in the last 168 hours.  Hematology Recent Labs  Lab 05/04/21 1646 05/04/21 2130 05/05/21 0438 05/06/21 0252  WBC 9.1  --  8.4 7.8  RBC 3.81*  --  3.25* 3.29*  HGB 11.7* 11.9* 9.8* 10.1*  HCT 36.6* 35.0* 30.9* 31.1*  MCV 96.1  --  95.1 94.5  MCH 30.7  --  30.2 30.7  MCHC 32.0  --  31.7 32.5  RDW 14.5  --  14.5 14.3  PLT 267  --  261 283   Thyroid No results for input(s): TSH, FREET4 in the last 168 hours.  BNP Recent Labs  Lab 05/04/21 1651  BNP 143.7*    DDimer No results for input(s): DDIMER in the last 168 hours.   Radiology/Studies:  DG Chest 2 View  Result Date: 05/04/2021 CLINICAL DATA:  Chest pain EXAM: CHEST - 2 VIEW COMPARISON:  01/10/2021 FINDINGS: Blunting of left costophrenic angle, likely small effusion. Scarring in the left lung base. Right base atelectasis. Heart is normal size. Prior CABG. No acute bony abnormality. IMPRESSION: Small left pleural effusion. Left basilar scarring and right base atelectasis. Electronically Signed   By: Rolm Baptise M.D.   On: 05/04/2021 17:16   CT HEAD WO CONTRAST (5MM)  Result Date: 05/04/2021 CLINICAL DATA:  Altered mental status EXAM: CT HEAD WITHOUT CONTRAST TECHNIQUE: Contiguous axial images were obtained from the base of the skull through the vertex without intravenous contrast. COMPARISON:  None. FINDINGS: Brain: Mild age related volume loss. No acute intracranial abnormality. Specifically, no hemorrhage, hydrocephalus, mass lesion, acute infarction, or significant intracranial injury. Vascular: No hyperdense vessel or unexpected calcification. Skull: No acute calvarial abnormality. Sinuses/Orbits: No acute findings Other: None IMPRESSION: No acute intracranial abnormality. Electronically Signed   By: Rolm Baptise M.D.   On: 05/04/2021 19:23   CT Angio Chest PE W and/or Wo Contrast  Result Date: 05/04/2021 CLINICAL DATA:  History of lung carcinoma with chest pain,  initial encounter EXAM: CT ANGIOGRAPHY CHEST WITH CONTRAST TECHNIQUE: Multidetector CT imaging of the chest was performed using the standard protocol during bolus administration of intravenous contrast. Multiplanar CT image reconstructions and MIPs were obtained to evaluate the vascular anatomy. CONTRAST:  25mL OMNIPAQUE IOHEXOL 350 MG/ML SOLN COMPARISON:  04/23/2021 PET-CT FINDINGS: Cardiovascular: Atherosclerotic calcifications of the thoracic aorta are noted without aneurysmal dilatation or dissection. Changes of prior coronary bypass grafting are seen. Mild cardiac enlargement is noted. Coronary calcifications are identified as well. Pulmonary artery is well visualized with evidence of filling defect within right lower and middle lobe branches consistent with pulmonary embolus.  No right heart strain is noted. No pericardial effusion is seen. Mediastinum/Nodes: Thoracic inlet is within normal limits. No sizable hilar or mediastinal adenopathy is noted. Small stable lymph nodes are seen. Calcified lymph nodes are noted within the right hila and subcarinal region consistent with prior granulomatous disease. The esophagus as visualized is within normal limits. Lungs/Pleura: Changes of prior left upper lobectomy are noted with volume loss identified. Some scarring is noted in the left lower lobe. There remains a pleural based density stable in appearance from the prior PET-CT which was mildly hypermetabolic on prior exam. Right lung shows stable nodules in the right upper lobe as well as within the right lower lobe similar to that seen on prior CT examination. Calcified granulomas are noted as well. Some mild focal infiltrate is seen within the right middle lobe likely sequelae from the pulmonary emboli. A few scattered smaller nodules are noted also stable in appearance from the prior CT. Upper Abdomen: Visualized upper abdomen again shows a large right adrenal mass similar to that seen on prior PET-CT. Known left  adrenal mass is not well visualized on this exam.No other upper abdominal abnormality is seen. Musculoskeletal: Degenerative changes of the thoracic spine are noted. Lytic lesion is noted in the left half of the T1 vertebra similar to that seen on prior PET-CT. Pathologic rib fracture is noted in the right fourth rib laterally which corresponds to a hypermetabolic lesion seen on prior PET-CT. No other definitive rib fractures are seen. The known T10 vertebral body lesion is again seen and stable from the prior PET-CT. Review of the MIP images confirms the above findings. IMPRESSION: Findings consistent with pulmonary embolism involving the right lower lobe and right middle lobe branches. No right heart strain is noted. Some associated new airspace opacity is noted in the right middle lobe likely related to the underlying embolus. No definitive infarct is noted. Changes consistent with prior left upper lobectomy consistent with the given clinical history. Scattered pulmonary nodules are noted unchanged from recent PET-CT from 04/23/2021. Multiple bony metastatic lesions with a pathologic fracture involving the right fourth rib laterally. Stable appearing large right adrenal lesion consistent with metastatic disease. Aortic Atherosclerosis (ICD10-I70.0). Critical Value/emergent results were called by telephone at the time of interpretation on 05/04/2021 at 7:34 pm to [facility] , who verbally acknowledged these results. Electronically Signed   By: Inez Catalina M.D.   On: 05/04/2021 19:35   MR Brain W Wo Contrast  Result Date: 05/05/2021 CLINICAL DATA:  Non-small cell lung cancer, metastatic, assess for CNS metastasis EXAM: MRI HEAD WITHOUT AND WITH CONTRAST TECHNIQUE: Multiplanar, multiecho pulse sequences of the brain and surrounding structures were obtained without and with intravenous contrast. CONTRAST:  35mL GADAVIST GADOBUTROL 1 MMOL/ML IV SOLN COMPARISON:  06/22/2020. FINDINGS: Brain: No acute  infarction, hemorrhage, hydrocephalus, extra-axial collection, or mass lesion. The ventricles and sulci are commensurate with degree of volume loss. No abnormal enhancement. Vascular: Normal flow voids. Skull and upper cervical spine: Normal marrow signal. No abnormal enhancement. Sinuses/Orbits: Mucous retention cyst mucosal thickening in the maxillary sinuses and left greater than right ethmoid air cells. The orbits are unremarkable. Other: The mastoids are well aerated. IMPRESSION: No acute intracranial process. No evidence of metastatic disease in the brain. Electronically Signed   By: Merilyn Baba M.D.   On: 05/05/2021 20:06   MR THORACIC SPINE W WO CONTRAST  Result Date: 05/05/2021 CLINICAL DATA:  Non-small cell lung cancer, metastatic EXAM: MRI THORACIC AND LUMBAR SPINE WITHOUT  AND WITH CONTRAST TECHNIQUE: Multiplanar and multiecho pulse sequences of the thoracic and lumbar spine were obtained without and with intravenous contrast. CONTRAST:  83mL GADAVIST GADOBUTROL 1 MMOL/ML IV SOLN COMPARISON:  No prior MRI, correlation is made with PET scan 04/23/2021 and 11/25/2020 and 04/26/2020 CT chest FINDINGS: MRI THORACIC SPINE FINDINGS Alignment: S shaped curvature of the thoracolumbar spine. No significant listhesis. Vertebrae: Decreased T1 signal, increased T2 signal, and contrast enhancement in the left aspect of the T1 and right aspect of the T10 vertebral bodies (series 24, image 9). Enhancement extends slightly into the right pedicle at T10. A focus of contrast is noted in the left facet at T1 (series 24, image 14). No epidural extension of tumor no evidence of pathologic fracture. Mild wedging of the T1 vertebral body appears degenerative and unchanged compared to 04/26/2020. Additional T1 and T2 hyperintense foci in multiple vertebral bodies are consistent with benign hemangiomas. Cord:  Normal signal and morphology.  No abnormal enhancement. Paraspinal and other soft tissues: Evaluation of the lungs  is limited by motion artifact. The paraspinal tissues are unremarkable. Disc levels: No high-grade spinal canal stenosis. Intervertebral discs are largely preserved. Two MRI LUMBAR SPINE FINDINGS Segmentation:  Standard. Alignment:  Physiologic. Vertebrae: Enhancing lesion in the left iliac bone, adjacent to the sacroiliac joint (series 13, image 47), measuring up to 2.2 x 1.2 cm in the axial plane but not included on the sagittal images given the field of view this study. Vertebral body heights are preserved. No other suspicious lesion. Conus medullaris: Extends to the L1 level and appears normal. No abnormal enhancement. Paraspinal and other soft tissues: Evaluation is somewhat limited by motion artifact. Small renal cysts. No significant pelvic lymphadenopathy is seen. Disc levels: T12-L4: No significant disc bulge. No spinal canal stenosis or neural foraminal narrowing. L4-L5: Disc desiccation and broad-based disc bulge. Mild facet arthropathy. No spinal canal stenosis. Narrowing of the bilateral lateral recesses. Mild bilateral neural foraminal narrowing, left greater than right. L5-S1: No significant disc bulge. No spinal canal stenosis or neural foraminal narrowing. IMPRESSION: 1. Abnormal signal and contrast enhancement in the T1 and T10 vertebral bodies, concerning for metastatic disease. No evidence of pathologic fracture or epidural extension of tumor. 2. Abnormal signal and contrast enhancement in the left iliac bone, adjacent the sacroiliac joint, also concerning for metastatic disease, which measures up to 2.2 cm in the axial plane but is not included on the sagittal images given the focus of this study. If further evaluation is clinically warranted, consider an MRI of the sacrum. 3. No spinal canal stenosis or high-grade neural foraminal narrowing. Electronically Signed   By: Merilyn Baba M.D.   On: 05/05/2021 20:01   MR Lumbar Spine W Wo Contrast  Result Date: 05/05/2021 CLINICAL DATA:   Non-small cell lung cancer, metastatic EXAM: MRI THORACIC AND LUMBAR SPINE WITHOUT AND WITH CONTRAST TECHNIQUE: Multiplanar and multiecho pulse sequences of the thoracic and lumbar spine were obtained without and with intravenous contrast. CONTRAST:  36mL GADAVIST GADOBUTROL 1 MMOL/ML IV SOLN COMPARISON:  No prior MRI, correlation is made with PET scan 04/23/2021 and 11/25/2020 and 04/26/2020 CT chest FINDINGS: MRI THORACIC SPINE FINDINGS Alignment: S shaped curvature of the thoracolumbar spine. No significant listhesis. Vertebrae: Decreased T1 signal, increased T2 signal, and contrast enhancement in the left aspect of the T1 and right aspect of the T10 vertebral bodies (series 24, image 9). Enhancement extends slightly into the right pedicle at T10. A focus of contrast is noted in the  left facet at T1 (series 24, image 14). No epidural extension of tumor no evidence of pathologic fracture. Mild wedging of the T1 vertebral body appears degenerative and unchanged compared to 04/26/2020. Additional T1 and T2 hyperintense foci in multiple vertebral bodies are consistent with benign hemangiomas. Cord:  Normal signal and morphology.  No abnormal enhancement. Paraspinal and other soft tissues: Evaluation of the lungs is limited by motion artifact. The paraspinal tissues are unremarkable. Disc levels: No high-grade spinal canal stenosis. Intervertebral discs are largely preserved. Two MRI LUMBAR SPINE FINDINGS Segmentation:  Standard. Alignment:  Physiologic. Vertebrae: Enhancing lesion in the left iliac bone, adjacent to the sacroiliac joint (series 13, image 47), measuring up to 2.2 x 1.2 cm in the axial plane but not included on the sagittal images given the field of view this study. Vertebral body heights are preserved. No other suspicious lesion. Conus medullaris: Extends to the L1 level and appears normal. No abnormal enhancement. Paraspinal and other soft tissues: Evaluation is somewhat limited by motion artifact.  Small renal cysts. No significant pelvic lymphadenopathy is seen. Disc levels: T12-L4: No significant disc bulge. No spinal canal stenosis or neural foraminal narrowing. L4-L5: Disc desiccation and broad-based disc bulge. Mild facet arthropathy. No spinal canal stenosis. Narrowing of the bilateral lateral recesses. Mild bilateral neural foraminal narrowing, left greater than right. L5-S1: No significant disc bulge. No spinal canal stenosis or neural foraminal narrowing. IMPRESSION: 1. Abnormal signal and contrast enhancement in the T1 and T10 vertebral bodies, concerning for metastatic disease. No evidence of pathologic fracture or epidural extension of tumor. 2. Abnormal signal and contrast enhancement in the left iliac bone, adjacent the sacroiliac joint, also concerning for metastatic disease, which measures up to 2.2 cm in the axial plane but is not included on the sagittal images given the focus of this study. If further evaluation is clinically warranted, consider an MRI of the sacrum. 3. No spinal canal stenosis or high-grade neural foraminal narrowing. Electronically Signed   By: Merilyn Baba M.D.   On: 05/05/2021 20:01   ECHOCARDIOGRAM COMPLETE  Result Date: 05/05/2021    ECHOCARDIOGRAM REPORT   Patient Name:   JOVANI FLURY Date of Exam: 05/05/2021 Medical Rec #:  [id number]        Height:       71.0 in Accession #:    [id number]       Weight:       200.0 lb Date of Birth:  [birth year]        BSA:          2.109 m Patient Age:    40 years         BP:           109/64 mmHg Patient Gender: M                HR:           80 bpm. Exam Location:  Inpatient Procedure: 2D Echo, Cardiac Doppler and Color Doppler Indications:    Pulmonary embolus  History:        Patient has prior history of Echocardiogram examinations, most                 recent 02/28/2020. Cardiomyopathy, CAD, Prior CABG,                 Arrythmias:RBBB; Risk Factors:Hypertension, Dyslipidemia and                 Sleep Apnea.  Sonographer:  Clayton Lefort RDCS (AE) Referring Phys: [place]  1. Left ventricular ejection fraction, by estimation, is 45 to 50%. The left ventricle has mildly decreased function. The left ventricle has no regional wall motion abnormalities. There is mild left ventricular hypertrophy. Left ventricular diastolic parameters are consistent with Grade I diastolic dysfunction (impaired relaxation). There is septal bounce.  2. Right ventricular systolic function is normal. The right ventricular size is moderately enlarged. There is normal pulmonary artery systolic pressure. The estimated right ventricular systolic pressure is 96.7 mmHg.  3. Left atrial size was mildly dilated.  4. The mitral valve is normal in structure. Mild mitral valve regurgitation. No evidence of mitral stenosis.  5. The aortic valve is tricuspid. There is moderate calcification of the aortic valve. There is moderate thickening of the aortic valve. Aortic valve regurgitation is not visualized. Mild to moderate aortic valve sclerosis/calcification is present, without any evidence of aortic stenosis.  6. The inferior vena cava is normal in size with greater than 50% respiratory variability, suggesting right atrial pressure of 3 mmHg. FINDINGS  Left Ventricle: Left ventricular ejection fraction, by estimation, is 45 to 50%. The left ventricle has mildly decreased function. The left ventricle has no regional wall motion abnormalities. The left ventricular internal cavity size was normal in size. There is mild left ventricular hypertrophy. Septal bounce. Left ventricular diastolic parameters are consistent with Grade I diastolic dysfunction (impaired relaxation). Right Ventricle: The right ventricular size is moderately enlarged. No increase in right ventricular wall thickness. Right ventricular systolic function is normal. There is normal pulmonary artery systolic pressure. The tricuspid regurgitant velocity is 2.79 m/s, and with an assumed  right atrial pressure of 3 mmHg, the estimated right ventricular systolic pressure is 89.3 mmHg. Left Atrium: Left atrial size was mildly dilated. Right Atrium: Right atrial size was normal in size. Pericardium: There is no evidence of pericardial effusion. Mitral Valve: The mitral valve is normal in structure. Mild mitral valve regurgitation. No evidence of mitral valve stenosis. MV peak gradient, 4.1 mmHg. The mean mitral valve gradient is 1.0 mmHg. Tricuspid Valve: The tricuspid valve is normal in structure. Tricuspid valve regurgitation is mild . No evidence of tricuspid stenosis. Aortic Valve: The aortic valve is tricuspid. There is moderate calcification of the aortic valve. There is moderate thickening of the aortic valve. Aortic valve regurgitation is not visualized. Mild to moderate aortic valve sclerosis/calcification is present, without any evidence of aortic stenosis. Aortic valve mean gradient measures 5.0 mmHg. Aortic valve peak gradient measures 8.5 mmHg. Aortic valve area, by VTI measures 2.90 cm. Pulmonic Valve: The pulmonic valve was normal in structure. Pulmonic valve regurgitation is not visualized. No evidence of pulmonic stenosis. Aorta: The aortic root is normal in size and structure. Venous: The inferior vena cava is normal in size with greater than 50% respiratory variability, suggesting right atrial pressure of 3 mmHg. IAS/Shunts: No atrial level shunt detected by color flow Doppler.  LEFT VENTRICLE PLAX 2D LVIDd:         5.40 cm   Diastology LVIDs:         3.70 cm   LV e' medial:    4.24 cm/s LV PW:         1.20 cm   LV E/e' medial:  11.3 LV IVS:        1.30 cm   LV e' lateral:   7.72 cm/s LVOT diam:     2.30 cm   LV E/e' lateral: 6.2 LV  SV:         72 LV SV Index:   34 LVOT Area:     4.15 cm  RIGHT VENTRICLE             IVC RV Basal diam:  3.40 cm     IVC diam: 0.80 cm RV S prime:     10.30 cm/s TAPSE (M-mode): 1.6 cm LEFT ATRIUM           Index        RIGHT ATRIUM           Index LA  diam:      4.20 cm 1.99 cm/m   RA Area:     21.40 cm LA Vol (A2C): 87.1 ml 41.31 ml/m  RA Volume:   65.10 ml  30.87 ml/m LA Vol (A4C): 66.3 ml 31.44 ml/m  AORTIC VALVE AV Area (Vmax):    2.87 cm AV Area (Vmean):   2.43 cm AV Area (VTI):     2.90 cm AV Vmax:           146.00 cm/s AV Vmean:          105.000 cm/s AV VTI:            0.249 m AV Peak Grad:      8.5 mmHg AV Mean Grad:      5.0 mmHg LVOT Vmax:         101.00 cm/s LVOT Vmean:        61.300 cm/s LVOT VTI:          0.174 m LVOT/AV VTI ratio: 0.70  AORTA Ao Root diam: 3.70 cm Ao Asc diam:  3.70 cm MITRAL VALVE               TRICUSPID VALVE MV Area (PHT): 3.24 cm    TR Peak grad:   31.1 mmHg MV Area VTI:   2.98 cm    TR Vmax:        279.00 cm/s MV Peak grad:  4.1 mmHg MV Mean grad:  1.0 mmHg    SHUNTS MV Vmax:       1.01 m/s    Systemic VTI:  0.17 m MV Vmean:      51.9 cm/s   Systemic Diam: 2.30 cm MV Decel Time: 234 msec MV E velocity: 48.00 cm/s MV A velocity: 86.10 cm/s MV E/A ratio:  0.56 Candee Furbish MD Electronically signed by Candee Furbish MD Signature Date/Time: 05/05/2021/1:48:53 PM    Final    VAS [place] LOWER EXTREMITY VENOUS (DVT)  Result Date: 05/05/2021  Lower Venous DVT Study Patient Name:  ASHAD FAWBUSH  Date of Exam:   05/05/2021 Medical Rec #: [id number]         Accession #:    [id number] Date of Birth: Nov 07, 1945         Patient Gender: M Patient Age:   68 years Exam Location:  [facility] Procedure:      VAS [place] LOWER EXTREMITY VENOUS (DVT) Referring Phys: Jennette Kettle --------------------------------------------------------------------------------  Indications: Pulmonary embolism.  Risk Factors: Cancer lung cancer. Limitations: (88/72) Comparison Study: no prior study Performing Technologist: Sharion Dove RVS  Examination Guidelines: A complete evaluation includes B-mode imaging, spectral Doppler, color Doppler, and power Doppler as needed of all accessible portions of each vessel. Bilateral testing is considered an  integral part of a complete examination. Limited examinations for reoccurring indications may be performed as noted. The reflux portion of the exam is performed with the patient  in reverse Trendelenburg.  +---------+---------------+---------+-----------+----------+--------------+ RIGHT    CompressibilityPhasicitySpontaneityPropertiesThrombus Aging +---------+---------------+---------+-----------+----------+--------------+ CFV      Full           Yes      Yes                                 +---------+---------------+---------+-----------+----------+--------------+ SFJ      Full                                                        +---------+---------------+---------+-----------+----------+--------------+ FV Prox  Full                                                        +---------+---------------+---------+-----------+----------+--------------+ FV Mid   Full                                                        +---------+---------------+---------+-----------+----------+--------------+ FV DistalFull                                                        +---------+---------------+---------+-----------+----------+--------------+ PFV      Full                                                        +---------+---------------+---------+-----------+----------+--------------+ POP      Full           Yes                                          +---------+---------------+---------+-----------+----------+--------------+ PTV      Full                                                        +---------+---------------+---------+-----------+----------+--------------+ PERO     Full                                                        +---------+---------------+---------+-----------+----------+--------------+   +---------+---------------+---------+-----------+----------+--------------+ LEFT     CompressibilityPhasicitySpontaneityPropertiesThrombus  Aging +---------+---------------+---------+-----------+----------+--------------+ CFV      Full           Yes      Yes                                 +---------+---------------+---------+-----------+----------+--------------+  SFJ      Full                                                        +---------+---------------+---------+-----------+----------+--------------+ FV Prox  Full                                                        +---------+---------------+---------+-----------+----------+--------------+ FV Mid   Full                                                        +---------+---------------+---------+-----------+----------+--------------+ FV DistalFull                                                        +---------+---------------+---------+-----------+----------+--------------+ PFV      Full                                                        +---------+---------------+---------+-----------+----------+--------------+ POP      Full           Yes      No                                  +---------+---------------+---------+-----------+----------+--------------+ PTV      Full                                                        +---------+---------------+---------+-----------+----------+--------------+ PERO     Full                                                        +---------+---------------+---------+-----------+----------+--------------+ Gastroc  Full                                                        +---------+---------------+---------+-----------+----------+--------------+     Summary: BILATERAL: - No evidence of deep vein thrombosis seen in the lower extremities, bilaterally. - RIGHT: - There is no evidence of deep vein thrombosis in the lower extremity.   *See table(s) above for measurements and observations. Electronically signed by Servando Snare MD on 05/05/2021 at 4:34:39  PM.    Final      Assessment and  Plan:   PE - PE in RLL and RML branches without right heart strain - treat with eliquis   CAD s/p CABG x 4 2007 Hyperlipidemia with LDL goal < 70 - subsequent PCI - has been on ASA and plavix - given new OAC requirement in the setting of metastatic cance, will stop plavix - continue ASA, statin, and zetia - given new CM and cMRI with mixed ICM/NICM, he was scheduled for repeat nuclear stress test, which he missed due to being in the hospital - will need to discuss with Dr. [name] regarding next steps in his workup - if repeat PCI, would then be on triple therapy in the setting of metastatic cancer   Mixed NICM/ICM Hypertension - echo this admission with LVEF 45-50%, grade 1 DD, improved from 40-45% 02/27/21 - PTA, was maintained on entresto 24-26 mg BID, doxazosin 2 mg daily, spironolactone 25 mg daily - BP here has been labile and the above medications have been held - restart entresto at discharge and monitor BP - will monitor BP at home - would the restart spironolactone as BP allows - has an appt with pharmD in 2 weeks   PVCs - frequent asymptomatic PVCs on telemetry - has been on BB before, but discontinued - I do not see this in his med history - consider adding low dose 12.5 mg toprol at night - Mg 2.1   OSA  - Continue CPAP   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    Risk Assessment/Risk Scores:    New York Heart Association (NYHA) Functional Class NYHA Class I        For questions or updates, please contact [facility] Please consult [email or web address] for contact info under    Signed, Ledora Bottcher, PA  05/06/2021 1:28 PM

## 2021-05-06 NOTE — Discharge Instructions (Addendum)
For your blood pressure: We are restarting the entresto and spironolactone, as well as adding metoprolol at night time. All of these can affect your blood pressure. If you feel dizzy/lightheaded, please contact us. If your blood pressure drops to <100/70, please stop the spironolactone and let us know.  Information on my medicine - ELIQUIS (apixaban)  This medication education was reviewed with me or my healthcare representative as part of my discharge preparation.  T  Why was Eliquis prescribed for you? Eliquis was prescribed to treat blood clots that may have been found in the veins of your legs (deep vein thrombosis) or in your lungs (pulmonary embolism) and to reduce the risk of them occurring again.  What do You need to know about Eliquis ? The starting dose is 10 mg (two 5 mg tablets) taken TWICE daily for the FIRST SEVEN (7) DAYS, then on (enter date)  05/13/21  the dose is reduced to ONE 5 mg tablet taken TWICE daily.  Eliquis may be taken with or without food.   Try to take the dose about the same time in the morning and in the evening. If you have difficulty swallowing the tablet whole please discuss with your pharmacist how to take the medication safely.  Take Eliquis exactly as prescribed and DO NOT stop taking Eliquis without talking to the doctor who prescribed the medication.  Stopping may increase your risk of developing a new blood clot.  Refill your prescription before you run out.  After discharge, you should have regular check-up appointments with your healthcare provider that is prescribing your Eliquis.    What do you do if you miss a dose? If a dose of ELIQUIS is not taken at the scheduled time, take it as soon as possible on the same day and twice-daily administration should be resumed. The dose should not be doubled to make up for a missed dose.  Important Safety Information A possible side effect of Eliquis is bleeding. You should call your healthcare  provider right away if you experience any of the following: Bleeding from an injury or your nose that does not stop. Unusual colored urine (red or dark brown) or unusual colored stools (red or black). Unusual bruising for unknown reasons. A serious fall or if you hit your head (even if there is no bleeding).  Some medicines may interact with Eliquis and might increase your risk of bleeding or clotting while on Eliquis. To help avoid this, consult your healthcare provider or pharmacist prior to using any new prescription or non-prescription medications, including herbals, vitamins, non-steroidal anti-inflammatory drugs (NSAIDs) and supplements.  This website has more information on Eliquis (apixaban): http://www.eliquis.com/eliquis/home  ===========================================  Pulmonary Embolism    A pulmonary embolism (PE) is a sudden blockage or decrease of blood flow in one or both lungs. Most blockages come from a blood clot that forms in the vein of a lower leg, thigh, or arm (deep vein thrombosis, DVT) and travels to the lungs. A clot is blood that has thickened into a gel or solid. PE is a dangerous and life-threatening condition that needs to be treated right away.  What are the causes? This condition is usually caused by a blood clot that forms in a vein and moves to the lungs. In rare cases, it may be caused by air, fat, part of a tumor, or other tissue that moves through the veins and into the lungs.  What increases the risk? The following factors may make you more likely to  develop this condition: Experiencing a traumatic injury, such as breaking a hip or leg. Having: A spinal cord injury. Orthopedic surgery, especially hip or knee replacement. Any major surgery. A stroke. DVT. Blood clots or blood clotting disease. Long-term (chronic) lung or heart disease. Cancer treated with chemotherapy. A central venous catheter. Taking medicines that contain estrogen. These  include birth control pills and hormone replacement therapy. Being: Pregnant. In the period of time after your baby is delivered (postpartum). Older than age 90. Overweight. A smoker, especially if you have other risks.  What are the signs or symptoms? Symptoms of this condition usually start suddenly and include: Shortness of breath during activity or at rest. Coughing, coughing up blood, or coughing up blood-tinged mucus. Chest pain that is often worse with deep breaths. Rapid or irregular heartbeat. Feeling light-headed or dizzy. Fainting. Feeling anxious. Fever. Sweating. Pain and swelling in a leg. This is a symptom of DVT, which can lead to PE. How is this diagnosed? This condition may be diagnosed based on: Your medical history. A physical exam. Blood tests. CT pulmonary angiogram. This test checks blood flow in and around your lungs. Ventilation-perfusion scan, also called a lung VQ scan. This test measures air flow and blood flow to the lungs. An ultrasound of the legs.  How is this treated? Treatment for this condition depends on many factors, such as the cause of your PE, your risk for bleeding or developing more clots, and other medical conditions you have. Treatment aims to remove, dissolve, or stop blood clots from forming or growing larger. Treatment may include: Medicines, such as: Blood thinning medicines (anticoagulants) to stop clots from forming. Medicines that dissolve clots (thrombolytics). Procedures, such as: Using a flexible tube to remove a blood clot (embolectomy) or to deliver medicine to destroy it (catheter-directed thrombolysis). Inserting a filter into a large vein that carries blood to the heart (inferior vena cava). This filter (vena cava filter) catches blood clots before they reach the lungs. Surgery to remove the clot (surgical embolectomy). This is rare. You may need a combination of immediate, long-term (up to 3 months after diagnosis), and  extended (more than 3 months after diagnosis) treatments. Your treatment may continue for several months (maintenance therapy). You and your health care provider will work together to choose the treatment program that is best for you.  Follow these instructions at home: Medicines Take over-the-counter and prescription medicines only as told by your health care provider. If you are taking an anticoagulant medicine: Take the medicine every day at the same time each day. Understand what foods and drugs interact with your medicine. Understand the side effects of this medicine, including excessive bruising or bleeding. Ask your health care provider or pharmacist about other side effects.  General instructions Wear a medical alert bracelet or carry a medical alert card that says you have had a PE and lists what medicines you take. Ask your health care provider when you may return to your normal activities. Avoid sitting or lying for a long time without moving. Maintain a healthy weight. Ask your health care provider what weight is healthy for you. Do not use any products that contain nicotine or tobacco, such as cigarettes, e-cigarettes, and chewing tobacco. If you need help quitting, ask your health care provider. Talk with your health care provider about any travel plans. It is important to make sure that you are still able to take your medicine while on trips. Keep all follow-up visits as told by your  health care provider. This is important.  Contact a health care provider if: You missed a dose of your blood thinner medicine.  Get help right away if: You have: New or increased pain, swelling, warmth, or redness in an arm or leg. Numbness or tingling in an arm or leg. Shortness of breath during activity or at rest. A fever. Chest pain. A rapid or irregular heartbeat. A severe headache. Vision changes. A serious fall or accident, or you hit your head. Stomach (abdominal) pain. Blood in  your vomit, stool, or urine. A cut that will not stop bleeding. You cough up blood. You feel light-headed or dizzy. You cannot move your arms or legs. You are confused or have memory loss.  These symptoms may represent a serious problem that is an emergency. Do not wait to see if the symptoms will go away. Get medical help right away. Call your local emergency services (911 in the [place] Do not drive yourself to the hospital. Summary A pulmonary embolism (PE) is a sudden blockage or decrease of blood flow in one or both lungs. PE is a dangerous and life-threatening condition that needs to be treated right away. Treatments for this condition usually include medicines to thin your blood (anticoagulants) or medicines to break apart blood clots (thrombolytics). If you are given blood thinners, it is important to take the medicine every day at the same time each day. Understand what foods and drugs interact with any medicines that you are taking. If you have signs of PE or DVT, call your local emergency services (911 in the [place] This information is not intended to replace advice given to you by your health care provider. Make sure you discuss any questions you have with your health care provider. Document Revised: 04/13/2018 Document Reviewed: 04/13/2018 Elsevier Patient Education  2020 [employer].

## 2021-05-08 ENCOUNTER — Telehealth: Payer: Self-pay | Admitting: *Deleted

## 2021-05-08 NOTE — Telephone Encounter (Signed)
The patient has been notified of the result and verbalized understanding.  All questions (if any) were answered. Marolyn Hammock, [place] 05/08/2021 1:15 PM

## 2021-05-08 NOTE — Telephone Encounter (Signed)
-----   Message from Sueanne Margarita, MD sent at 04/14/2021 11:18 AM EDT ----- Good AHI and compliance.  Continue current PAP settings.

## 2021-05-09 ENCOUNTER — Telehealth: Payer: Self-pay | Admitting: Radiation Oncology

## 2021-05-09 ENCOUNTER — Encounter: Payer: Self-pay | Admitting: *Deleted

## 2021-05-09 DIAGNOSIS — C3492 Malignant neoplasm of unspecified part of left bronchus or lung: Secondary | ICD-10-CM

## 2021-05-09 LAB — CULTURE, BLOOD (ROUTINE X 2)
Culture: NO GROWTH
Culture: NO GROWTH
Special Requests: ADEQUATE
Special Requests: ADEQUATE

## 2021-05-09 NOTE — Progress Notes (Signed)
Location of tumor and Histology per Pathology Report: LUL lung adenocarcinoma  Biopsy:    Past/Anticipated interventions by surgeon, if any:   05/09/2020 Surgeon: [name], DO Operation: Flexible video fiberoptic bronchoscopy with electromagnetic navigation and biopsies.  05/22/2020 Procedure: - Robotic assisted Left video thoracoscopy - Left upper lobectomy - Mediastinal lymph node sampling - Intercostal nerve block   Surgeon and Role:      * Lightfoot, Lucile Crater, MD - Primary    * Macarthur Critchley, PA-C - assisting  Past/Anticipated interventions by medical oncology, if any: Dr Marin Olp Current Therapy:        S/P cycle #6 of Carbo/Alimta/Pembrolizumab Alimta/pembrolizumab-maintenance therapy s/p cycle 3/4  - start on 11/28/2020 --Alimta discontinued after cycle #2. Lumakras 960 mg po q day -- start on 05/02/2021    Pain issues, if any:  no   SAFETY ISSUES: Prior radiation? no Pacemaker/ICD? no Possible current pregnancy? no Is the patient on methotrexate? no  Current Complaints / other details:  previously had back pain and rib pain, recent pulmonary embolism, mild shortness of breath with activity, mild dry cough, denies hemoptysis     Vitals:   05/14/21 0833  BP: (!) 97/57  Pulse: 65  Resp: 18  Temp: 97.8 F (36.6 C)  SpO2: 100%  Weight: 197 lb (89.4 kg)  Height: 5\' 11"  (1.803 m)

## 2021-05-09 NOTE — Progress Notes (Signed)
Patient has been discharged from the hospital. He will follow up with Dr Marin Olp as previously scheduled on 05/20/2021. Dr Marin Olp states he has spoken to Dr Sondra Come regarding radiation to his spine. Referral order placed.   Oncology Nurse Navigator Documentation  Oncology Nurse Navigator Flowsheets 05/09/2021  Abnormal Finding Date -  Confirmed Diagnosis Date -  Diagnosis Status -  Planned Course of Treatment -  Phase of Treatment -  Chemotherapy Actual Start Date: -  Chemotherapy Actual End Date: -  Surgery Actual Start Date: -  Navigator Follow Up Date: 05/20/2021  Navigator Follow Up Reason: Follow-up Appointment  Navigator Location [facility]  Referral Date to RadOnc/MedOnc -  Navigator Encounter Type Appt/Treatment Plan Review  Telephone -  Treatment Initiated Date -  Patient Visit Type MedOnc  Treatment Phase Active Tx  Barriers/Navigation Needs Coordination of Care;Education  Education -  Interventions Coordination of Care;Referrals  Acuity Level 2-Minimal Needs (1-2 Barriers Identified)  Coordination of Care Other  Education Method -  Support Groups/Services Friends and Family  Time Spent with Patient 30

## 2021-05-12 ENCOUNTER — Other Ambulatory Visit (HOSPITAL_COMMUNITY): Payer: Self-pay

## 2021-05-12 ENCOUNTER — Encounter (HOSPITAL_COMMUNITY): Payer: Medicare Other

## 2021-05-12 NOTE — Progress Notes (Signed)
Patient ID: Kden Wagster                 DOB: 04/14/1946                      MRN: [id number]    HPI: John Hinton is a 75 y.o. male referred by Dr. [name] to pharmacy clinic for HF medication management. PMH is significant for CAD s/p CABG then PCI of SVG to OM1, repeat heart cath for unstable angina on 01/27/2017 showed severe focal in-stent restenosis in the SVG to OM stent s/p balloon angioplasty; all of the grafts were patent. Also has hx of dilated cardiomyopathy with both ischemic and nonischemic etiologies (most recent LVEF 45-50% on 05/05/21), HLD, HTN, OSA, stage 4a NSCLC s/p resection and chemo but has returned and metastasized to adrenal glands, bones, and spine and started new chemo 04/30/21. Cardiac MRI showed decline of EF thought to be due to chemotherapy and ramipril was switched to [facility]. Plan was for follow up nuclear stress test but needs rescheduled as patient was admitted at [facility] 10/16-18/22 for SOB/CP/confusion, found to have pulmonary embolism in RML and RLL and being treated with Eliquis managed by oncology team.   Today he returns to pharmacy clinic for further medication titration accompanied by his wife John Hinton. Symptomatically, he is feeling better following his hospital discharge, denies dizziness, lightheadedness, and fatigue. Denies chest pain or palpitations. Activity level limited given recent hospitalization/PE. He checks his weight at home and it has remained stable. Appetite has been good since discharge. He adheres to a low-salt diet.  Current CHF meds: Entresto 24-26 mg BID, metoprolol succinate 25 mg daily, spironolactone 25 mg daily Other current HTN meds: doxazosin 2 mg daily (switched from amlodipine in 2016) Previously tried HTN meds: amlodipine (edema) BP goal: <130/80 mmHg  Family History: Cancer in his sister; Heart disease in his father; Heart failure in his father; Lung cancer in his father and mother  Social History: Former smoker (quit  1985)  Diet: Does not add salt to foods  Exercise: Limited due to recent hospitalization/PE  Home BP readings: bicep cuff, 110-120s/60-75  Labs:  05/05/21: Scr 1.00, K 4.1 (Entresto 24-26 mg BID) 04/08/21: Scr 0.80, K 4.5 (ramipril 10 mg daily)  Wt Readings from Last 3 Encounters:  05/06/21 192 lb 6.4 oz (87.3 kg)  04/25/21 201 lb (91.2 kg)  04/08/21 201 lb 9.6 oz (91.4 kg)   BP Readings from Last 3 Encounters:  05/06/21 136/80  04/25/21 119/65  04/08/21 112/74   Pulse Readings from Last 3 Encounters:  05/06/21 81  04/25/21 65  04/08/21 64    Renal function: Estimated Creatinine Clearance: 70.1 mL/min (by C-G formula based on SCr of 1 mg/dL).  Past Medical History:  Diagnosis Date   BPH (benign prostatic hypertrophy)    Bradycardia    a. H/o asymptomatic bradycardia.   Coronary artery disease    a. s/p CABG ~2007 with LIMA to LAD, SVG seq to OM1 and OM2, SVG to PDA and SVG to diag. b. Abnormal nuc 03/2015 - s/p DES to SVG-OM1-OM2. 01/29/17 POBA with cutting balloon to SVG-->OM.  No ischemia Lexi myoview 2021   DCM (dilated cardiomyopathy) ([place]) 09/17/2015   mixed by cMRI 03/2021 with EF 39% with non viable infarct in basal to mid lateral wall and LGE pattern c/w nonischemic component as well. Mild RV dysfunction RVEF 42%.   Dyslipidemia    Dyspnea    r/t lung mass  ED (erectile dysfunction)    Fear of heights    Goals of care, counseling/discussion 06/19/2020   Hypertension    Non-small cell carcinoma of lung, stage 4, left ([facility]) 06/19/2020   OSA (obstructive sleep apnea) 07/12/2015   Moderate OSA with AHI 16.5/hr now on CPAP at 8cm H2O, uses cpap nightly      RBBB    Shoulder, capsulitis, adhesive    Left Shoulder    Current Outpatient Medications on File Prior to Visit  Medication Sig Dispense Refill   acetaminophen (TYLENOL) 500 MG tablet Take 1,000 mg by mouth every 6 (six) hours as needed for moderate pain or headache.     APIXABAN (ELIQUIS) VTE STARTER  PACK (10MG  AND 5MG ) Take as directed on package: start with two-5mg  tablets twice daily for 7 days. On day 8, switch to one-5mg  tablet twice daily. 74 each 0   aspirin 81 MG tablet Take 81 mg by mouth at bedtime.      cholecalciferol (VITAMIN D) 1000 UNITS tablet Take 1,000 Units by mouth at bedtime.      Cyanocobalamin (B-12 PO) Take 1,000 mcg by mouth daily.      doxazosin (CARDURA) 2 MG tablet TAKE ONE TABLET BY MOUTH DAILY (Patient taking differently: Take 2 mg by mouth daily.) 90 tablet 0   ezetimibe (ZETIA) 10 MG tablet Take 1 tablet (10 mg total) by mouth daily. (Patient taking differently: Take 10 mg by mouth at bedtime.) 90 tablet 3   folic acid (FOLVITE) 357 MCG tablet Take 400 mcg by mouth daily.     metoprolol succinate (TOPROL-XL) 25 MG 24 hr tablet Take 1 tablet (25 mg total) by mouth at bedtime. 30 tablet 0   nitroGLYCERIN (NITROSTAT) 0.4 MG SL tablet Place 1 tablet (0.4 mg total) under the tongue every 5 (five) minutes as needed for chest pain. 25 tablet 3   rosuvastatin (CRESTOR) 40 MG tablet TAKE ONE TABLET BY MOUTH EVERY NIGHT AT BEDTIME (Patient taking differently: Take 40 mg by mouth at bedtime.) 90 tablet 0   sacubitril-valsartan (ENTRESTO) 24-26 MG Take 1 tablet by mouth 2 (two) times daily. 60 tablet 11   sertraline (ZOLOFT) 50 MG tablet Take 50 mg by mouth in the morning and at bedtime.      sotorasib (LUMAKRAS) 120 MG TABS Take 8 tablets (960 mg) by mouth daily after breakfast. 240 tablet 5   spironolactone (ALDACTONE) 25 MG tablet TAKE ONE TABLET BY MOUTH DAILY (Patient taking differently: Take 25 mg by mouth daily.) 90 tablet 3   traMADol (ULTRAM) 50 MG tablet Take 1 tablet (50 mg total) by mouth every 6 (six) hours as needed. (Patient taking differently: Take 50 mg by mouth every 6 (six) hours as needed for moderate pain.) 90 tablet 0   [DISCONTINUED] prochlorperazine (COMPAZINE) 10 MG tablet Take 1 tablet (10 mg total) by mouth every 6 (six) hours as needed (Nausea or  vomiting). 30 tablet 1   No current facility-administered medications on file prior to visit.    Allergies  Allergen Reactions   Amlodipine Swelling    Swelling of ankles     Assessment/Plan:  1. CHF - BP in clinic today 106/56 mmHg, HR 71. In order to allow room to titrate [facility], will discontinue doxazosin today which is only being used for hypertension. Given BP on the lower end today, will continue current dose of Entresto 24-26 mg BID and he will monitor his BP after stopping doxazosin. Consider increasing Entresto at next visit with  Dr. [name] on 11/15 if able. He will bring his log of BP readings and machine to that visit. His HR is stable after recently starting metoprolol succinate 25 mg in the hospital. Will continue this as well as spironolactone 25 mg daily.   2. Cost Considerations - He is now on two branded medications, Entresto and Eliquis. His wife thinks that he will remain on Eliquis long-term since his cancer is what caused the PE. This is managed by oncology. He paid $494 on 10/10 for Entresto (had to paid through $480 deductible), then Eliquis on 10/18 was $28.86. John Hinton should cost similar to Eliquis for future fills, except that his deductible will reset at the beginning of 2023. They are looking into switching plans to see if they can find a plan that is more affordable. If he is going to remain on Eliquis, he will still have to pay through any deductible annually then costs for both Entresto and Eliquis should go down. However, he will also reach the donut hole sooner next year with two branded medications and costs will again go up and he does not currently meet the income requirements for Eliquis or Entresto patient assistance. He also pays >$3000 per month for his new chemotherapy, Lumakras. Given these significant out of pocket medication costs, would consider risk/benefit of switching back to ramipril in 2023 to delay the time it takes to reach the donut hole since  it sounds like Eliquis will need to be a chronic medication. Given the cost of Entresto, they would like to have a prescription for a 90 day supply so that it lasts longer into the new year. Given that we may increase the dose at the next visit, will defer this until then which they understand.   John Hinton, PharmD PGY2 Ambulatory Care Pharmacy Resident 05/13/2021 11:09 AM

## 2021-05-13 ENCOUNTER — Other Ambulatory Visit: Payer: Self-pay

## 2021-05-13 ENCOUNTER — Ambulatory Visit (INDEPENDENT_AMBULATORY_CARE_PROVIDER_SITE_OTHER): Payer: Medicare Other | Admitting: Student-PharmD

## 2021-05-13 VITALS — BP 106/56 | HR 71

## 2021-05-13 DIAGNOSIS — I2510 Atherosclerotic heart disease of native coronary artery without angina pectoris: Secondary | ICD-10-CM | POA: Diagnosis not present

## 2021-05-13 DIAGNOSIS — I420 Dilated cardiomyopathy: Secondary | ICD-10-CM | POA: Diagnosis not present

## 2021-05-13 NOTE — Progress Notes (Signed)
Radiation Oncology         [phone] ________________________________  Initial Outpatient Consultation  Name: John Hinton MRN: [id number]  Date: 05/14/2021  DOB: 1945/11/17  GY:JEHUDJS, Jenny Reichmann, MD  [name], MD   REFERRING PHYSICIAN: [name], MD  DIAGNOSIS: The encounter diagnosis was Non-small cell carcinoma of lung, stage 4, left ([place]).  LUL adenocarcinoma, now with metastasis to right lung, bilateral adrenal glands, and bony metastasis to T1, T10, and left iliac bone  HISTORY OF PRESENT ILLNESS::John Hinton is a 75 y.o. male who is accompanied by his wife. he is seen as a courtesy of Dr. Marin Olp for an opinion concerning radiation therapy as part of management for his recently diagnosed lung cancer. The patient initially presented to his PCP in September of 2021 with shortness of breath. Chest x-ray performed for evaluation on 04/16/2020 demonstrated a suspected 4.8 cm left upper lobe lung mass. Findings prompted CT of the chest on 04/16/2020 which demonstrated the opacity noted on the prior chest CT to correspond with a 3.1 cm pulmonary mass within the left upper lobe. An indeterminate 2.1 cm nodule within the left lung base was noted as well.   Accordingly, the patient was referred to Dr. Kipp Brood, and proceeded to undergo bronchoscopy with endobrachial biopsies on 05/09/2020 . Pathology from the procedure revealed malignant cells present consistent with non-small cell carcinoma of the LUL.   PET on 05/14/21 revealed a hypermetabolic 2.8 cm left upper lobe lung mass consistent with the primary lung neoplasm. No mediastinal or hilar lymphadenopathy or evidence of metastatic disease were elsewhere seen.   The patient was then referred to Dr. Valeta Harms on 05/17/20 for surgical evaluation/ consideration. Following discussion, the patient opted to proceed with left-upper lobectomy on 05/22/20 under the care of Dr. Kipp Brood. Pathology from the procedure revealed  moderately differentiated adenocarcinoma, acinar predominant, spanning 5.7 cm. Tumor noted to invade the pleura, and multiple pleural tumor deposits present. Nodal status of 6/9  lymph nodes involved by carcinoma.   Genetic testing collected on 05/22/21 revealed no actionable mutation, though was positive for PD-L1 and KRAS (G12C).   The patient was then referred to Dr. Marin Olp on 06/19/20 for consideration of further treatment. Following discussion, the patient opted to proceed with adjuvant chemotherapy consisting of Carbo/Alimta/Pembrolizumab. First dose on 06/27/20.   MRI of the brain performed on 06/22/20 prior to start of chemotherapy revealed no evidence of intracranial metastatic disease.   Restaging PET on 11/25/20 demonstrated no evidence of local recurrence following LEFT upper lobe resection. Loculated fluid collection was noted at the left lung base with mild increase in complexity. No evidence of mediastinal nodal metastasis, supraclavicular nodal metastasis, or distant metastatic disease was seen otherwise.  Following cycle 6 of Carbo/Alimta/Pembrolizumab, the patient continued therapy consisting of Alimta/pembrolizumab-maintenance starting on 11/28/20.   Cardiac MRI perfomred on 04/17/21 demonstrated mild left ventricular dilatation with moderate systolic dysfunction and an EF 39%. Mixed cardiomyopathy with evidence of both ischemic and nonischemic etiologies was appreciated as well. RV was of normal size with mild systolic dysfunction, and an EF of 42%.   Restaging PET on 04/23/21 unfortunately demonstrated new metastatic disease including the right lung, bilateral adrenal glands, and multiple locations in the skeleton (spinal metastasis to T1, T10, left iliac bone). Left lung was seen with reduced left pleural effusion and resolution of a region of probable rounded atelectasis in the left lower lobe.  Accordingly, the patient met with Dr. Marin Olp on 04/25/21 to discuss these  findings. During  this visit, the patient reported back pain. Dr. Marin Olp noted that the patient's disease recurrence is likely due to his  K-ras mutation, which puts him at an increased risk of recurrence and can decrease response to chemotherapy. Due to this, the patient was placed on  Lumakras, 960 mg daily.   MRI of the thoracic and lumbar spine with and without contrast performed on 05/03/21 demonstrated abnormal signal and contrast enhancement in the T1 and T10 vertebral bodies, concerning for metastatic disease. Abnormal signal and contrast enhancement was also appreciated in the left iliac bone, adjacent the sacroiliac joint, also concerning for metastatic disease, measuring up to 2.2 cm in the axial plane.   MRI of the brain performed  to rule out metastasis on 05/03/21 demonstrated no acute intracranial process, and no evidence of metastatic disease in the brain.  The patient presented to the ED on 05/04/21 with chest pain, shortness of breath and confusion. Patient was found to have submassive PE (on imaging below). He was admitted due to right-sided pleuritic pain on inhalation. Patient was discharged home on 05/06/21 with Eliquis or Xarelto. Imaging performed during hospital course is detailed below: CT of the head without contrast on 05/04/21 again demonstrated no acute intracranial abnormality. CTA performed on this same date demonstrated findings consistent with pulmonary embolism involving the right lower lobe and right middle lobe branches. No right heart strain was noted. Some associated new airspace opacity was noted in the right middle lobe likely related to the underlying embolus. Multiple bony metastatic lesions were again noted, with a pathologic fracture seen involving the right fourth rib laterally. Right adrenal lesion consistent with metastatic disease was noted as well, stable since prior imaging.    PREVIOUS RADIATION THERAPY: No  PAST MEDICAL HISTORY:  Past Medical History:   Diagnosis Date   BPH (benign prostatic hypertrophy)    Bradycardia    a. H/o asymptomatic bradycardia.   Coronary artery disease    a. s/p CABG ~2007 with LIMA to LAD, SVG seq to OM1 and OM2, SVG to PDA and SVG to diag. b. Abnormal nuc 03/2015 - s/p DES to SVG-OM1-OM2. 01/29/17 POBA with cutting balloon to SVG-->OM.  No ischemia Lexi myoview 2021   DCM (dilated cardiomyopathy) ([place]) 09/17/2015   mixed by cMRI 03/2021 with EF 39% with non viable infarct in basal to mid lateral wall and LGE pattern c/w nonischemic component as well. Mild RV dysfunction RVEF 42%.   Dyslipidemia    Dyspnea    r/t lung mass   ED (erectile dysfunction)    Fear of heights    Goals of care, counseling/discussion 06/19/2020   Hypertension    Non-small cell carcinoma of lung, stage 4, left ([place]) 06/19/2020   OSA (obstructive sleep apnea) 07/12/2015   Moderate OSA with AHI 16.5/hr now on CPAP at 8cm H2O, uses cpap nightly      RBBB    Shoulder, capsulitis, adhesive    Left Shoulder    PAST SURGICAL HISTORY: Past Surgical History:  Procedure Laterality Date   APPENDECTOMY     BRONCHIAL BIOPSY  05/09/2020   Procedure: BRONCHIAL BIOPSIES;  Surgeon: [name], DO;  Location: [place] [facility];  Service: Pulmonary;;   BRONCHIAL BRUSHINGS  05/09/2020   Procedure: BRONCHIAL BRUSHINGS;  Surgeon: [name], DO;  Location: [place] [facility];  Service: Pulmonary;;   BRONCHIAL NEEDLE ASPIRATION BIOPSY  05/09/2020   Procedure: BRONCHIAL NEEDLE ASPIRATION BIOPSIES;  Surgeon: [name], DO;  Location: [place] [facility];  Service: Pulmonary;;  BRONCHIAL WASHINGS  05/09/2020   Procedure: BRONCHIAL WASHINGS;  Surgeon: [name], DO;  Location: [place] [facility];  Service: Pulmonary;;   CARDIAC CATHETERIZATION N/A 04/05/2015   Procedure: Left Heart Cath and Coronary Angiography;  Surgeon: Sherren Mocha, MD;  Location: [place] [facility];  Service: Cardiovascular;  Laterality: N/A;   COLONOSCOPY     CORONARY ARTERY  BYPASS GRAFT     w/LIMA to LAD, seq SVG to OM1 and OM2, SVG to PDA and SVG to Diagonal   CORONARY BALLOON ANGIOPLASTY N/A 01/29/2017   Procedure: Coronary Balloon Angioplasty;  Surgeon: Martinique, Peter M, MD;  Location: [place] [facility];  Service: Cardiovascular;  Laterality: N/A;   FIDUCIAL MARKER PLACEMENT  05/09/2020   Procedure: FIDUCIAL MARKER PLACEMENT;  Surgeon: [name], DO;  Location: [place] [facility];  Service: Pulmonary;;   INTERCOSTAL NERVE BLOCK Left 05/22/2020   Procedure: INTERCOSTAL NERVE BLOCK;  Surgeon: Lajuana Matte, MD;  Location: [place];  Service: Thoracic;  Laterality: Left;   LEFT HEART CATH AND CORS/GRAFTS ANGIOGRAPHY N/A 01/29/2017   Procedure: Left Heart Cath and Cors/Grafts Angiography;  Surgeon: Martinique, Peter M, MD;  Location: [place] [facility];  Service: Cardiovascular;  Laterality: N/A;   NODE DISSECTION N/A 05/22/2020   Procedure: NODE DISSECTION;  Surgeon: Lajuana Matte, MD;  Location: [place];  Service: Thoracic;  Laterality: N/A;   VIDEO BRONCHOSCOPY WITH ENDOBRONCHIAL NAVIGATION N/A 05/09/2020   Procedure: VIDEO BRONCHOSCOPY WITH ENDOBRONCHIAL NAVIGATION;  Surgeon: [name], DO;  Location: [place];  Service: Pulmonary;  Laterality: N/A;   VIDEO BRONCHOSCOPY WITH ENDOBRONCHIAL ULTRASOUND N/A 05/09/2020   Procedure: VIDEO BRONCHOSCOPY WITH ENDOBRONCHIAL ULTRASOUND;  Surgeon: [name], DO;  Location: [place];  Service: Pulmonary;  Laterality: N/A;    FAMILY HISTORY:  Family History  Problem Relation Age of Onset   Lung cancer Mother    Lung cancer Father    Heart disease Father    Heart failure Father    Cancer Sister     SOCIAL HISTORY:  Social History   Tobacco Use   Smoking status: Former    Types: Cigarettes    Quit date: 07/21/1983    Years since quitting: 37.8   Smokeless tobacco: Former  Scientific laboratory technician Use: Never used  Substance Use Topics   Alcohol use: Yes    Alcohol/week: 20.0 - 28.0 standard  drinks    Types: 20 - 28 Standard drinks or equivalent per week    Comment: daily - 20-28 /week   Drug use: No    ALLERGIES:  Allergies  Allergen Reactions   Amlodipine Swelling    Swelling of ankles    MEDICATIONS:  Current Outpatient Medications  Medication Sig Dispense Refill   acetaminophen (TYLENOL) 500 MG tablet Take 1,000 mg by mouth every 6 (six) hours as needed for moderate pain or headache.     APIXABAN (ELIQUIS) VTE STARTER PACK (10MG AND 5MG) Take as directed on package: start with two-87m tablets twice daily for 7 days. On day 8, switch to one-52mtablet twice daily. 74 each 0   aspirin 81 MG tablet Take 81 mg by mouth at bedtime.      cholecalciferol (VITAMIN D) 1000 UNITS tablet Take 1,000 Units by mouth at bedtime.      Cyanocobalamin (B-12 PO) Take 1,000 mcg by mouth daily.      ezetimibe (ZETIA) 10 MG tablet Take 1 tablet (10 mg total) by mouth daily. (Patient taking differently: Take  10 mg by mouth at bedtime.) 90 tablet 3   folic acid (FOLVITE) 720 MCG tablet Take 400 mcg by mouth daily.     metoprolol succinate (TOPROL-XL) 25 MG 24 hr tablet Take 1 tablet (25 mg total) by mouth at bedtime. 30 tablet 0   rosuvastatin (CRESTOR) 40 MG tablet TAKE ONE TABLET BY MOUTH EVERY NIGHT AT BEDTIME (Patient taking differently: Take 40 mg by mouth at bedtime.) 90 tablet 0   sacubitril-valsartan (ENTRESTO) 24-26 MG Take 1 tablet by mouth 2 (two) times daily. 60 tablet 11   sertraline (ZOLOFT) 50 MG tablet Take 50 mg by mouth in the morning and at bedtime.      sotorasib (LUMAKRAS) 120 MG TABS Take 8 tablets (960 mg) by mouth daily after breakfast. 240 tablet 5   spironolactone (ALDACTONE) 25 MG tablet TAKE ONE TABLET BY MOUTH DAILY (Patient taking differently: Take 25 mg by mouth daily.) 90 tablet 3   traMADol (ULTRAM) 50 MG tablet Take 1 tablet (50 mg total) by mouth every 6 (six) hours as needed. (Patient taking differently: Take 50 mg by mouth every 6 (six) hours as needed for  moderate pain.) 90 tablet 0   nitroGLYCERIN (NITROSTAT) 0.4 MG SL tablet Place 1 tablet (0.4 mg total) under the tongue every 5 (five) minutes as needed for chest pain. (Patient not taking: Reported on 05/14/2021) 25 tablet 3   No current facility-administered medications for this encounter.    REVIEW OF SYSTEMS:  A 10+ POINT REVIEW OF SYSTEMS WAS OBTAINED including neurology, dermatology, psychiatry, cardiac, respiratory, lymph, extremities, GI, GU, musculoskeletal, constitutional, reproductive, HEENT.  He reports his pain is improved significantly since discharge from the hospital for management of his pulmonary embolus.  He denies any pain in his lower neck upper chest region at this time.  He denies any significant pain in the lower thoracic spine.  He also denies any pain in the left medial pelvis region.  No reports of bowel or bladder incontinence.  No problems with fecal incontinence.  He does have periods of alternating diarrhea and constipation related to his therapy.   PHYSICAL EXAM:  height is '5\' 11"'  (1.803 m) and weight is 197 lb (89.4 kg). His temperature is 97.8 F (36.6 C). His blood pressure is 97/57 (abnormal) and his pulse is 65. His respiration is 18 and oxygen saturation is 100%.   General: Alert and oriented, in no acute distress HEENT: Head is normocephalic. Extraocular movements are intact. Oropharynx is clear. Neck: Neck is supple, no palpable cervical or supraclavicular lymphadenopathy. Heart: Regular in rate and rhythm with no murmurs, rubs, or gallops. Chest: Clear to auscultation bilaterally, with no rhonchi, wheezes, or rales. Abdomen: Soft, nontender, nondistended, with no rigidity or guarding. Extremities: No cyanosis or edema. Lymphatics: see Neck Exam Skin: No concerning lesions. Musculoskeletal: symmetric strength and muscle tone throughout. Neurologic: Cranial nerves II through XII are grossly intact. No obvious focalities. Speech is fluent. Coordination is  intact. Psychiatric: Judgment and insight are intact. Affect is appropriate.   ECOG = 1  0 - Asymptomatic (Fully active, able to carry on all predisease activities without restriction)  1 - Symptomatic but completely ambulatory (Restricted in physically strenuous activity but ambulatory and able to carry out work of a light or sedentary nature. For example, light housework, office work)  2 - Symptomatic, <50% in bed during the day (Ambulatory and capable of all self care but unable to carry out any work activities. Up and about more than 50%  of waking hours)  3 - Symptomatic, >50% in bed, but not bedbound (Capable of only limited self-care, confined to bed or chair 50% or more of waking hours)  4 - Bedbound (Completely disabled. Cannot carry on any self-care. Totally confined to bed or chair)  5 - Death   Eustace Pen MM, Creech RH, Tormey DC, et al. [phone]). "Toxicity and response criteria of the [facility]". [place] Oncol. 5 (6): 649-55  LABORATORY DATA:  Lab Results  Component Value Date   WBC 7.8 05/06/2021   HGB 10.1 (L) 05/06/2021   HCT 31.1 (L) 05/06/2021   MCV 94.5 05/06/2021   PLT 283 05/06/2021   NEUTROABS 7.4 05/04/2021   Lab Results  Component Value Date   NA 132 (L) 05/05/2021   K 4.2 05/06/2021   CL 100 05/05/2021   CO2 22 05/05/2021   GLUCOSE 99 05/05/2021   CREATININE 1.00 05/05/2021   CALCIUM 8.6 (L) 05/05/2021      RADIOGRAPHY: DG Chest 2 View  Result Date: 05/04/2021 CLINICAL DATA:  Chest pain EXAM: CHEST - 2 VIEW COMPARISON:  01/10/2021 FINDINGS: Blunting of left costophrenic angle, likely small effusion. Scarring in the left lung base. Right base atelectasis. Heart is normal size. Prior CABG. No acute bony abnormality. IMPRESSION: Small left pleural effusion. Left basilar scarring and right base atelectasis. Electronically Signed   By: Rolm Baptise M.D.   On: 05/04/2021 17:16   CT HEAD WO CONTRAST (5MM)  Result Date:  05/04/2021 CLINICAL DATA:  Altered mental status EXAM: CT HEAD WITHOUT CONTRAST TECHNIQUE: Contiguous axial images were obtained from the base of the skull through the vertex without intravenous contrast. COMPARISON:  None. FINDINGS: Brain: Mild age related volume loss. No acute intracranial abnormality. Specifically, no hemorrhage, hydrocephalus, mass lesion, acute infarction, or significant intracranial injury. Vascular: No hyperdense vessel or unexpected calcification. Skull: No acute calvarial abnormality. Sinuses/Orbits: No acute findings Other: None IMPRESSION: No acute intracranial abnormality. Electronically Signed   By: Rolm Baptise M.D.   On: 05/04/2021 19:23   CT Angio Chest PE W and/or Wo Contrast  Result Date: 05/04/2021 CLINICAL DATA:  History of lung carcinoma with chest pain, initial encounter EXAM: CT ANGIOGRAPHY CHEST WITH CONTRAST TECHNIQUE: Multidetector CT imaging of the chest was performed using the standard protocol during bolus administration of intravenous contrast. Multiplanar CT image reconstructions and MIPs were obtained to evaluate the vascular anatomy. CONTRAST:  37m OMNIPAQUE IOHEXOL 350 MG/ML SOLN COMPARISON:  04/23/2021 PET-CT FINDINGS: Cardiovascular: Atherosclerotic calcifications of the thoracic aorta are noted without aneurysmal dilatation or dissection. Changes of prior coronary bypass grafting are seen. Mild cardiac enlargement is noted. Coronary calcifications are identified as well. Pulmonary artery is well visualized with evidence of filling defect within right lower and middle lobe branches consistent with pulmonary embolus. No right heart strain is noted. No pericardial effusion is seen. Mediastinum/Nodes: Thoracic inlet is within normal limits. No sizable hilar or mediastinal adenopathy is noted. Small stable lymph nodes are seen. Calcified lymph nodes are noted within the right hila and subcarinal region consistent with prior granulomatous disease. The esophagus  as visualized is within normal limits. Lungs/Pleura: Changes of prior left upper lobectomy are noted with volume loss identified. Some scarring is noted in the left lower lobe. There remains a pleural based density stable in appearance from the prior PET-CT which was mildly hypermetabolic on prior exam. Right lung shows stable nodules in the right upper lobe as well as within the right lower lobe similar  to that seen on prior CT examination. Calcified granulomas are noted as well. Some mild focal infiltrate is seen within the right middle lobe likely sequelae from the pulmonary emboli. A few scattered smaller nodules are noted also stable in appearance from the prior CT. Upper Abdomen: Visualized upper abdomen again shows a large right adrenal mass similar to that seen on prior PET-CT. Known left adrenal mass is not well visualized on this exam.No other upper abdominal abnormality is seen. Musculoskeletal: Degenerative changes of the thoracic spine are noted. Lytic lesion is noted in the left half of the T1 vertebra similar to that seen on prior PET-CT. Pathologic rib fracture is noted in the right fourth rib laterally which corresponds to a hypermetabolic lesion seen on prior PET-CT. No other definitive rib fractures are seen. The known T10 vertebral body lesion is again seen and stable from the prior PET-CT. Review of the MIP images confirms the above findings. IMPRESSION: Findings consistent with pulmonary embolism involving the right lower lobe and right middle lobe branches. No right heart strain is noted. Some associated new airspace opacity is noted in the right middle lobe likely related to the underlying embolus. No definitive infarct is noted. Changes consistent with prior left upper lobectomy consistent with the given clinical history. Scattered pulmonary nodules are noted unchanged from recent PET-CT from 04/23/2021. Multiple bony metastatic lesions with a pathologic fracture involving the right fourth  rib laterally. Stable appearing large right adrenal lesion consistent with metastatic disease. Aortic Atherosclerosis (ICD10-I70.0). Critical Value/emergent results were called by telephone at the time of interpretation on 05/04/2021 at 7:34 pm to [facility] , who verbally acknowledged these results. Electronically Signed   By: Inez Catalina M.D.   On: 05/04/2021 19:35   MR Brain W Wo Contrast  Result Date: 05/05/2021 CLINICAL DATA:  Non-small cell lung cancer, metastatic, assess for CNS metastasis EXAM: MRI HEAD WITHOUT AND WITH CONTRAST TECHNIQUE: Multiplanar, multiecho pulse sequences of the brain and surrounding structures were obtained without and with intravenous contrast. CONTRAST:  82m GADAVIST GADOBUTROL 1 MMOL/ML IV SOLN COMPARISON:  06/22/2020. FINDINGS: Brain: No acute infarction, hemorrhage, hydrocephalus, extra-axial collection, or mass lesion. The ventricles and sulci are commensurate with degree of volume loss. No abnormal enhancement. Vascular: Normal flow voids. Skull and upper cervical spine: Normal marrow signal. No abnormal enhancement. Sinuses/Orbits: Mucous retention cyst mucosal thickening in the maxillary sinuses and left greater than right ethmoid air cells. The orbits are unremarkable. Other: The mastoids are well aerated. IMPRESSION: No acute intracranial process. No evidence of metastatic disease in the brain. Electronically Signed   By: AMerilyn BabaM.D.   On: 05/05/2021 20:06   MR THORACIC SPINE W WO CONTRAST  Result Date: 05/05/2021 CLINICAL DATA:  Non-small cell lung cancer, metastatic EXAM: MRI THORACIC AND LUMBAR SPINE WITHOUT AND WITH CONTRAST TECHNIQUE: Multiplanar and multiecho pulse sequences of the thoracic and lumbar spine were obtained without and with intravenous contrast. CONTRAST:  955mGADAVIST GADOBUTROL 1 MMOL/ML IV SOLN COMPARISON:  No prior MRI, correlation is made with PET scan 04/23/2021 and 11/25/2020 and 04/26/2020 CT chest FINDINGS: MRI THORACIC  SPINE FINDINGS Alignment: S shaped curvature of the thoracolumbar spine. No significant listhesis. Vertebrae: Decreased T1 signal, increased T2 signal, and contrast enhancement in the left aspect of the T1 and right aspect of the T10 vertebral bodies (series 24, image 9). Enhancement extends slightly into the right pedicle at T10. A focus of contrast is noted in the left facet at T1 (series 24, image  14). No epidural extension of tumor no evidence of pathologic fracture. Mild wedging of the T1 vertebral body appears degenerative and unchanged compared to 04/26/2020. Additional T1 and T2 hyperintense foci in multiple vertebral bodies are consistent with benign hemangiomas. Cord:  Normal signal and morphology.  No abnormal enhancement. Paraspinal and other soft tissues: Evaluation of the lungs is limited by motion artifact. The paraspinal tissues are unremarkable. Disc levels: No high-grade spinal canal stenosis. Intervertebral discs are largely preserved. Two MRI LUMBAR SPINE FINDINGS Segmentation:  Standard. Alignment:  Physiologic. Vertebrae: Enhancing lesion in the left iliac bone, adjacent to the sacroiliac joint (series 13, image 47), measuring up to 2.2 x 1.2 cm in the axial plane but not included on the sagittal images given the field of view this study. Vertebral body heights are preserved. No other suspicious lesion. Conus medullaris: Extends to the L1 level and appears normal. No abnormal enhancement. Paraspinal and other soft tissues: Evaluation is somewhat limited by motion artifact. Small renal cysts. No significant pelvic lymphadenopathy is seen. Disc levels: T12-L4: No significant disc bulge. No spinal canal stenosis or neural foraminal narrowing. L4-L5: Disc desiccation and broad-based disc bulge. Mild facet arthropathy. No spinal canal stenosis. Narrowing of the bilateral lateral recesses. Mild bilateral neural foraminal narrowing, left greater than right. L5-S1: No significant disc bulge. No spinal  canal stenosis or neural foraminal narrowing. IMPRESSION: 1. Abnormal signal and contrast enhancement in the T1 and T10 vertebral bodies, concerning for metastatic disease. No evidence of pathologic fracture or epidural extension of tumor. 2. Abnormal signal and contrast enhancement in the left iliac bone, adjacent the sacroiliac joint, also concerning for metastatic disease, which measures up to 2.2 cm in the axial plane but is not included on the sagittal images given the focus of this study. If further evaluation is clinically warranted, consider an MRI of the sacrum. 3. No spinal canal stenosis or high-grade neural foraminal narrowing. Electronically Signed   By: Merilyn Baba M.D.   On: 05/05/2021 20:01   MR Lumbar Spine W Wo Contrast  Result Date: 05/05/2021 CLINICAL DATA:  Non-small cell lung cancer, metastatic EXAM: MRI THORACIC AND LUMBAR SPINE WITHOUT AND WITH CONTRAST TECHNIQUE: Multiplanar and multiecho pulse sequences of the thoracic and lumbar spine were obtained without and with intravenous contrast. CONTRAST:  28m GADAVIST GADOBUTROL 1 MMOL/ML IV SOLN COMPARISON:  No prior MRI, correlation is made with PET scan 04/23/2021 and 11/25/2020 and 04/26/2020 CT chest FINDINGS: MRI THORACIC SPINE FINDINGS Alignment: S shaped curvature of the thoracolumbar spine. No significant listhesis. Vertebrae: Decreased T1 signal, increased T2 signal, and contrast enhancement in the left aspect of the T1 and right aspect of the T10 vertebral bodies (series 24, image 9). Enhancement extends slightly into the right pedicle at T10. A focus of contrast is noted in the left facet at T1 (series 24, image 14). No epidural extension of tumor no evidence of pathologic fracture. Mild wedging of the T1 vertebral body appears degenerative and unchanged compared to 04/26/2020. Additional T1 and T2 hyperintense foci in multiple vertebral bodies are consistent with benign hemangiomas. Cord:  Normal signal and morphology.  No  abnormal enhancement. Paraspinal and other soft tissues: Evaluation of the lungs is limited by motion artifact. The paraspinal tissues are unremarkable. Disc levels: No high-grade spinal canal stenosis. Intervertebral discs are largely preserved. Two MRI LUMBAR SPINE FINDINGS Segmentation:  Standard. Alignment:  Physiologic. Vertebrae: Enhancing lesion in the left iliac bone, adjacent to the sacroiliac joint (series 13, image 47), measuring up  to 2.2 x 1.2 cm in the axial plane but not included on the sagittal images given the field of view this study. Vertebral body heights are preserved. No other suspicious lesion. Conus medullaris: Extends to the L1 level and appears normal. No abnormal enhancement. Paraspinal and other soft tissues: Evaluation is somewhat limited by motion artifact. Small renal cysts. No significant pelvic lymphadenopathy is seen. Disc levels: T12-L4: No significant disc bulge. No spinal canal stenosis or neural foraminal narrowing. L4-L5: Disc desiccation and broad-based disc bulge. Mild facet arthropathy. No spinal canal stenosis. Narrowing of the bilateral lateral recesses. Mild bilateral neural foraminal narrowing, left greater than right. L5-S1: No significant disc bulge. No spinal canal stenosis or neural foraminal narrowing. IMPRESSION: 1. Abnormal signal and contrast enhancement in the T1 and T10 vertebral bodies, concerning for metastatic disease. No evidence of pathologic fracture or epidural extension of tumor. 2. Abnormal signal and contrast enhancement in the left iliac bone, adjacent the sacroiliac joint, also concerning for metastatic disease, which measures up to 2.2 cm in the axial plane but is not included on the sagittal images given the focus of this study. If further evaluation is clinically warranted, consider an MRI of the sacrum. 3. No spinal canal stenosis or high-grade neural foraminal narrowing. Electronically Signed   By: Merilyn Baba M.D.   On: 05/05/2021 20:01    NM PET Image Restag (PS) Skull Base To Thigh  Result Date: 04/25/2021 CLINICAL DATA:  Subsequent treatment strategy for non-small cell lung cancer. EXAM: NUCLEAR MEDICINE PET SKULL BASE TO THIGH TECHNIQUE: 10.0 mCi F-18 FDG was injected intravenously. Full-ring PET imaging was performed from the skull base to thigh after the radiotracer. CT data was obtained and used for attenuation correction and anatomic localization. Fasting blood glucose: 98 mg/dl COMPARISON:  Multiple exams, including 11/25/2020 FINDINGS: Mediastinal blood pool activity: SUV max 2.6 Liver activity: SUV max NA NECK: Substantial activity in the anterior tongue with maximum SUV 13.4 (formerly 14.7, no underlying CT abnormality, most likely physiologic. Incidental CT findings: Mild bilateral chronic maxillary sinusitis. Bilateral common carotid atherosclerotic calcification. CHEST: A new 0.9 by 0.7 cm right lower lobe pulmonary nodule adjacent to the diaphragm on image 44 series 8 has a maximum SUV of 2.5. A new 0.6 by 0.4 cm right upper lobe nodule on image 14 series 8 has maximum SUV only 1.3 but is below sensitive PET-CT size thresholds. Other even smaller new nodularity in the right upper lobe noted including a 0.5 by 0.3 cm nodule on image 11 series 8. Calcified nodule in the posterior basal segment right lower lobe not hypermetabolic. The reduction in the left pleural effusion is noted with a peripheral area of probable rounded atelectasis in the left lower lobe measuring 2.7 by 1.2 cm on image 56 series 8 with maximum SUV 2.8. Malignancy is a differential diagnostic consideration. Incidental CT findings: Left upper lobectomy. Old granulomatous disease. Mild cardiomegaly. Coronary, aortic arch, and branch vessel atherosclerotic vascular disease. Prior CABG. ABDOMEN/PELVIS: New right adrenal mass 8.8 by 4.9 cm on image 107 series 4, maximum SUV 20.9, compatible with metastatic lesion. This indents the margin of the liver. New left  adrenal mass, 1.9 by 2.1 cm, maximum SUV 22.4, compatible with malignancy. Accentuated bowel activity without CT correlate, likely physiologic, particularly in the right colon. Incidental CT findings: Old granulomatous disease. Atherosclerosis is present, including aortoiliac atherosclerotic disease. Sigmoid colon diverticulosis. SKELETON: Scattered new osseous metastatic lesions are identified including the T1 vertebra, right scapula, right fourth rib, left  seventh rib, right T10 vertebral body, and left iliac bone adjacent to the SI joint. Index lesion eccentric to the right at the T10 vertebral level has maximum SUV of 10.9 and is predominantly lytic with a mild sclerotic rim. Incidental CT findings: none IMPRESSION: 1. New metastatic disease including the right lung, bilateral adrenal glands, and multiple locations in the skeleton as noted above. 2. Prior left upper lobectomy with some evolutionary findings in the left lung including reduced left pleural effusion and resolution of a region of probable rounded atelectasis in the left lower lobe (less likely to be tumor). 3. Aortic Atherosclerosis (ICD10-I70.0). Coronary atherosclerosis. Sigmoid colon diverticulosis. Chronic bilateral maxillary sinusitis. Old granulomatous disease. Prior CABG. Mild cardiomegaly. Electronically Signed   By: Van Clines M.D.   On: 04/25/2021 07:22   MR CARDIAC MORPHOLOGY W WO CONTRAST  Result Date: 04/18/2021 CLINICAL DATA:  Cardiomyopathy evaluation EXAM: CARDIAC MRI TECHNIQUE: The patient was scanned on a 1.5 Tesla Siemens magnet. A dedicated cardiac coil was used. Functional imaging was done using Fiesta sequences. 2,3, and 4 chamber views were done to assess for RWMA's. Modified Simpson's rule using a short axis stack was used to calculate an ejection fraction on a dedicated work Conservation officer, nature. The patient received 9 cc of Gadavist. After 10 minutes inversion recovery sequences were used to assess for  infiltration and scar tissue. CONTRAST:  9 cc  of Gadavist FINDINGS: Left ventricle: -Mild dilatation -Moderate systolic dysfunction. Significant thinning/dyskinesis of focal region of basal to mid lateral wall -Nonspecific ECV elevation (31%) -Basal septal midwall LGE -Subendocardial LGE in focal region of basal to mid lateral wall. LGE >50% transmural -RV insertion site LGE LV EF: 39% (Normal 56-78%) Absolute volumes: LV EDV: 285m (Normal 77-195 mL) LV ESV: 1355m(Normal 19-72 mL) LV SV: 9087mNormal 51-133 mL) CO: 5.9L/min (Normal 2.8-8.8 L/min) Indexed volumes: LV EDV: 107m34m-m (Normal 47-92 mL/sq-m) LV ESV: 65mL31mm (Normal 13-30 mL/sq-m) LV SV: 42mL/34m (Normal 32-62 mL/sq-m) CI: 2.7L/min/sq-m (Normal 1.7-4.2 L/min/sq-m) Right ventricle: Normal size with mild systolic dysfunction RV EF:  42% (Normal 47-74%) Absolute volumes: RV EDV: 215mL (45mal 88-227 mL) RV ESV: 124mL (N90ml 23-103 mL) RV SV: 91mL (No32m 52-138 mL) CO: 5.9L/min (Normal 2.8-8.8 L/min) Indexed volumes: RV EDV: 100mL/sq-m2mrmal 55-105 mL/sq-m) RV ESV: 58mL/sq-m 50mmal 15-43 mL/sq-m) RV SV: 43mL/sq-m (87mal 32-64 mL/sq-m) CI: 2.8L/min/sq-m (Normal 1.7-4.2 L/min/sq-m) Left atrium: Moderate enlargement Right atrium: Mild enlargement Mitral valve: Trivial regurgitation Aortic valve: Tricuspid. No regurgitation Tricuspid valve: Trivial regurgitation Pulmonic valve: No regurgitation Aorta: Normal proximal ascending aorta Pericardium: Normal IMPRESSION: 1. Mild LV dilatation with moderate systolic dysfunction (EF 39%). Signif09%nt thinning/dyskinesis of focal region of basal to mid lateral wall 2. 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 Subendocardial late gadolinium enhancement consistent with prior infarct in focal region of basal to mid lateral wall. LGE is greater than 50% transmural suggesting this region is not viable 4. Basal septal midwall LGE, which is a scar pattern seen in nonischemic  cardiomyopathies and associated with worse prognosis 5. RV insertion site LGE, which is a nonspecific scar pattern often seen in setting of elevated pulmonary pressures 6.  Normal RV size with mild systolic dysfunction (EF 42%) Electro40%ally Signed   By: Christopher Oswaldo Milian09/30/2022 22:17   ECHOCARDIOGRAM COMPLETE  Result Date: 05/05/2021    ECHOCARDIOGRAM REPORT   Patient Name:   Christopherjohn HARAEVARISTO TSUDAm: 05/05/2021 Medical Rec #:  [id number]  Height:       71.0 in Accession #:    [id number]       Weight:       200.0 lb Date of Birth:  04-02-1946        BSA:          2.109 m Patient Age:    75 years         BP:           109/64 mmHg Patient Gender: M                HR:           80 bpm. Exam Location:  Inpatient Procedure: 2D Echo, Cardiac Doppler and Color Doppler Indications:    Pulmonary embolus  History:        Patient has prior history of Echocardiogram examinations, most                 recent 02/28/2020. Cardiomyopathy, CAD, Prior CABG,                 Arrythmias:RBBB; Risk Factors:Hypertension, Dyslipidemia and                 Sleep Apnea.  Sonographer:    Clayton Lefort RDCS (AE) Referring Phys: [place]  1. Left ventricular ejection fraction, by estimation, is 45 to 50%. The left ventricle has mildly decreased function. The left ventricle has no regional wall motion abnormalities. There is mild left ventricular hypertrophy. Left ventricular diastolic parameters are consistent with Grade I diastolic dysfunction (impaired relaxation). There is septal bounce.  2. Right ventricular systolic function is normal. The right ventricular size is moderately enlarged. There is normal pulmonary artery systolic pressure. The estimated right ventricular systolic pressure is 26.3 mmHg.  3. Left atrial size was mildly dilated.  4. The mitral valve is normal in structure. Mild mitral valve regurgitation. No evidence of mitral stenosis.  5. The aortic valve is tricuspid.  There is moderate calcification of the aortic valve. There is moderate thickening of the aortic valve. Aortic valve regurgitation is not visualized. Mild to moderate aortic valve sclerosis/calcification is present, without any evidence of aortic stenosis.  6. The inferior vena cava is normal in size with greater than 50% respiratory variability, suggesting right atrial pressure of 3 mmHg. FINDINGS  Left Ventricle: Left ventricular ejection fraction, by estimation, is 45 to 50%. The left ventricle has mildly decreased function. The left ventricle has no regional wall motion abnormalities. The left ventricular internal cavity size was normal in size. There is mild left ventricular hypertrophy. Septal bounce. Left ventricular diastolic parameters are consistent with Grade I diastolic dysfunction (impaired relaxation). Right Ventricle: The right ventricular size is moderately enlarged. No increase in right ventricular wall thickness. Right ventricular systolic function is normal. There is normal pulmonary artery systolic pressure. The tricuspid regurgitant velocity is 2.79 m/s, and with an assumed right atrial pressure of 3 mmHg, the estimated right ventricular systolic pressure is 33.5 mmHg. Left Atrium: Left atrial size was mildly dilated. Right Atrium: Right atrial size was normal in size. Pericardium: There is no evidence of pericardial effusion. Mitral Valve: The mitral valve is normal in structure. Mild mitral valve regurgitation. No evidence of mitral valve stenosis. MV peak gradient, 4.1 mmHg. The mean mitral valve gradient is 1.0 mmHg. Tricuspid Valve: The tricuspid valve is normal in structure. Tricuspid valve regurgitation is mild . No evidence of tricuspid stenosis. Aortic Valve: The aortic valve is tricuspid.  There is moderate calcification of the aortic valve. There is moderate thickening of the aortic valve. Aortic valve regurgitation is not visualized. Mild to moderate aortic valve  sclerosis/calcification is present, without any evidence of aortic stenosis. Aortic valve mean gradient measures 5.0 mmHg. Aortic valve peak gradient measures 8.5 mmHg. Aortic valve area, by VTI measures 2.90 cm. Pulmonic Valve: The pulmonic valve was normal in structure. Pulmonic valve regurgitation is not visualized. No evidence of pulmonic stenosis. Aorta: The aortic root is normal in size and structure. Venous: The inferior vena cava is normal in size with greater than 50% respiratory variability, suggesting right atrial pressure of 3 mmHg. IAS/Shunts: No atrial level shunt detected by color flow Doppler.  LEFT VENTRICLE PLAX 2D LVIDd:         5.40 cm   Diastology LVIDs:         3.70 cm   LV e' medial:    4.24 cm/s LV PW:         1.20 cm   LV E/e' medial:  11.3 LV IVS:        1.30 cm   LV e' lateral:   7.72 cm/s LVOT diam:     2.30 cm   LV E/e' lateral: 6.2 LV SV:         72 LV SV Index:   34 LVOT Area:     4.15 cm  RIGHT VENTRICLE             IVC RV Basal diam:  3.40 cm     IVC diam: 0.80 cm RV S prime:     10.30 cm/s TAPSE (M-mode): 1.6 cm LEFT ATRIUM           Index        RIGHT ATRIUM           Index LA diam:      4.20 cm 1.99 cm/m   RA Area:     21.40 cm LA Vol (A2C): 87.1 ml 41.31 ml/m  RA Volume:   65.10 ml  30.87 ml/m LA Vol (A4C): 66.3 ml 31.44 ml/m  AORTIC VALVE AV Area (Vmax):    2.87 cm AV Area (Vmean):   2.43 cm AV Area (VTI):     2.90 cm AV Vmax:           146.00 cm/s AV Vmean:          105.000 cm/s AV VTI:            0.249 m AV Peak Grad:      8.5 mmHg AV Mean Grad:      5.0 mmHg LVOT Vmax:         101.00 cm/s LVOT Vmean:        61.300 cm/s LVOT VTI:          0.174 m LVOT/AV VTI ratio: 0.70  AORTA Ao Root diam: 3.70 cm Ao Asc diam:  3.70 cm MITRAL VALVE               TRICUSPID VALVE MV Area (PHT): 3.24 cm    TR Peak grad:   31.1 mmHg MV Area VTI:   2.98 cm    TR Vmax:        279.00 cm/s MV Peak grad:  4.1 mmHg MV Mean grad:  1.0 mmHg    SHUNTS MV Vmax:       1.01 m/s    Systemic VTI:   0.17 m MV Vmean:      51.9 cm/s  Systemic Diam: 2.30 cm MV Decel Time: 234 msec MV E velocity: 48.00 cm/s MV A velocity: 86.10 cm/s MV E/A ratio:  0.56 Candee Furbish MD Electronically signed by Candee Furbish MD Signature Date/Time: 05/05/2021/1:48:53 PM    Final    VAS [place] LOWER EXTREMITY VENOUS (DVT)  Result Date: 05/05/2021  Lower Venous DVT Study Patient Name:  ANTWIONE PICOTTE  Date of Exam:   05/05/2021 Medical Rec #: [id number]         Accession #:    [id number] Date of Birth: [birth year]         Patient Gender: M Patient Age:   27 years Exam Location:  [facility] Procedure:      VAS [place] LOWER EXTREMITY VENOUS (DVT) Referring Phys: Jennette Kettle --------------------------------------------------------------------------------  Indications: Pulmonary embolism.  Risk Factors: Cancer lung cancer. Limitations: (88/72) Comparison Study: no prior study Performing Technologist: Sharion Dove RVS  Examination Guidelines: A complete evaluation includes B-mode imaging, spectral Doppler, color Doppler, and power Doppler as needed of all accessible portions of each vessel. Bilateral testing is considered an integral part of a complete examination. Limited examinations for reoccurring indications may be performed as noted. The reflux portion of the exam is performed with the patient in reverse Trendelenburg.  +---------+---------------+---------+-----------+----------+--------------+ RIGHT    CompressibilityPhasicitySpontaneityPropertiesThrombus Aging +---------+---------------+---------+-----------+----------+--------------+ CFV      Full           Yes      Yes                                 +---------+---------------+---------+-----------+----------+--------------+ SFJ      Full                                                        +---------+---------------+---------+-----------+----------+--------------+ FV Prox  Full                                                         +---------+---------------+---------+-----------+----------+--------------+ FV Mid   Full                                                        +---------+---------------+---------+-----------+----------+--------------+ FV DistalFull                                                        +---------+---------------+---------+-----------+----------+--------------+ PFV      Full                                                        +---------+---------------+---------+-----------+----------+--------------+ POP      Full  Yes                                          +---------+---------------+---------+-----------+----------+--------------+ PTV      Full                                                        +---------+---------------+---------+-----------+----------+--------------+ PERO     Full                                                        +---------+---------------+---------+-----------+----------+--------------+   +---------+---------------+---------+-----------+----------+--------------+ LEFT     CompressibilityPhasicitySpontaneityPropertiesThrombus Aging +---------+---------------+---------+-----------+----------+--------------+ CFV      Full           Yes      Yes                                 +---------+---------------+---------+-----------+----------+--------------+ SFJ      Full                                                        +---------+---------------+---------+-----------+----------+--------------+ FV Prox  Full                                                        +---------+---------------+---------+-----------+----------+--------------+ FV Mid   Full                                                        +---------+---------------+---------+-----------+----------+--------------+ FV DistalFull                                                         +---------+---------------+---------+-----------+----------+--------------+ PFV      Full                                                        +---------+---------------+---------+-----------+----------+--------------+ POP      Full           Yes      No                                  +---------+---------------+---------+-----------+----------+--------------+  PTV      Full                                                        +---------+---------------+---------+-----------+----------+--------------+ PERO     Full                                                        +---------+---------------+---------+-----------+----------+--------------+ Gastroc  Full                                                        +---------+---------------+---------+-----------+----------+--------------+     Summary: BILATERAL: - No evidence of deep vein thrombosis seen in the lower extremities, bilaterally. - RIGHT: - There is no evidence of deep vein thrombosis in the lower extremity.   *See table(s) above for measurements and observations. Electronically signed by Servando Snare MD on 05/05/2021 at 4:34:39 PM.    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  We discussed potential palliative radiation therapy for management of his osseous metastasis.  There are no concerning lesions that would require preventative radiation therapy at this time.  should the patient's pain progress in 1 of these areas he would be good candidate for short course of palliative radiation therapy.  Today, I talked to the patient and his wife about the findings and work-up thus far.  We discussed the natural history of non-small cell lung cancer and general treatment, highlighting the role of radiotherapy in the  management.  We discussed the available radiation techniques, and focused on the details of logistics and delivery.  We reviewed the anticipated acute and late sequelae associated with radiation in this setting.  The patient was encouraged to ask questions that I answered to the best of my ability.  PLAN: As needed follow-up in radiation oncology.  He will continue to follow-up closely with Dr. Marin Olp and continue on his new therapy (Lumakras, 960 mg daily).   60 minutes of total time was spent for this patient encounter, including preparation, face-to-face counseling with the patient and coordination of care, physical exam, and documentation of the encounter.   ------------------------------------------------  Blair Promise, PhD, MD  This document serves as a record of services personally performed by Gery Pray, MD. It was created on his behalf by Roney Mans, a trained medical scribe. The creation of this record is based on the scribe's personal observations and the provider's statements to them. This document has been checked and approved by the attending provider.

## 2021-05-13 NOTE — Patient Instructions (Signed)
It was nice to see you today!  In clinic, your blood pressure was 106/56 mmHg.  Medication Changes: STOP taking doxazosin   Continue all other medications  Monitor blood pressure at home daily and keep a log (on your phone or piece of paper) to bring with you to your next visit.   Keep up the good work with diet and exercise. Aim for a diet full of vegetables, fruit and lean meats (chicken, [place], fish). Try to limit salt intake by eating fresh or frozen vegetables (instead of canned), rinse canned vegetables prior to cooking and do not add any additional salt to meals.   Pharmacist office #: [phone]

## 2021-05-14 ENCOUNTER — Ambulatory Visit
Admission: RE | Admit: 2021-05-14 | Discharge: 2021-05-14 | Disposition: A | Discharge status: Home / Self Care | Payer: Medicare Other | Source: Ambulatory Visit | Attending: Radiation Oncology | Admitting: Radiation Oncology

## 2021-05-14 ENCOUNTER — Encounter: Payer: Self-pay | Admitting: Radiation Oncology

## 2021-05-14 ENCOUNTER — Telehealth (HOSPITAL_COMMUNITY): Payer: Self-pay | Admitting: Pharmacist

## 2021-05-14 ENCOUNTER — Other Ambulatory Visit (HOSPITAL_COMMUNITY): Payer: Self-pay

## 2021-05-14 VITALS — BP 97/57 | HR 65 | Temp 97.8°F | Resp 18 | Ht 71.0 in | Wt 197.0 lb

## 2021-05-14 DIAGNOSIS — Z7982 Long term (current) use of aspirin: Secondary | ICD-10-CM | POA: Diagnosis not present

## 2021-05-14 DIAGNOSIS — R4182 Altered mental status, unspecified: Secondary | ICD-10-CM | POA: Diagnosis not present

## 2021-05-14 DIAGNOSIS — Z801 Family history of malignant neoplasm of trachea, bronchus and lung: Secondary | ICD-10-CM | POA: Insufficient documentation

## 2021-05-14 DIAGNOSIS — I2699 Other pulmonary embolism without acute cor pulmonale: Secondary | ICD-10-CM | POA: Diagnosis not present

## 2021-05-14 DIAGNOSIS — J320 Chronic maxillary sinusitis: Secondary | ICD-10-CM | POA: Diagnosis not present

## 2021-05-14 DIAGNOSIS — Z9221 Personal history of antineoplastic chemotherapy: Secondary | ICD-10-CM | POA: Insufficient documentation

## 2021-05-14 DIAGNOSIS — Z87891 Personal history of nicotine dependence: Secondary | ICD-10-CM | POA: Diagnosis not present

## 2021-05-14 DIAGNOSIS — G4733 Obstructive sleep apnea (adult) (pediatric): Secondary | ICD-10-CM | POA: Diagnosis not present

## 2021-05-14 DIAGNOSIS — M5136 Other intervertebral disc degeneration, lumbar region: Secondary | ICD-10-CM | POA: Insufficient documentation

## 2021-05-14 DIAGNOSIS — J90 Pleural effusion, not elsewhere classified: Secondary | ICD-10-CM | POA: Diagnosis not present

## 2021-05-14 DIAGNOSIS — K5730 Diverticulosis of large intestine without perforation or abscess without bleeding: Secondary | ICD-10-CM | POA: Insufficient documentation

## 2021-05-14 DIAGNOSIS — Z7901 Long term (current) use of anticoagulants: Secondary | ICD-10-CM | POA: Insufficient documentation

## 2021-05-14 DIAGNOSIS — I119 Hypertensive heart disease without heart failure: Secondary | ICD-10-CM | POA: Diagnosis not present

## 2021-05-14 DIAGNOSIS — N400 Enlarged prostate without lower urinary tract symptoms: Secondary | ICD-10-CM | POA: Insufficient documentation

## 2021-05-14 DIAGNOSIS — C7971 Secondary malignant neoplasm of right adrenal gland: Secondary | ICD-10-CM | POA: Diagnosis not present

## 2021-05-14 DIAGNOSIS — E785 Hyperlipidemia, unspecified: Secondary | ICD-10-CM | POA: Diagnosis not present

## 2021-05-14 DIAGNOSIS — C3412 Malignant neoplasm of upper lobe, left bronchus or lung: Secondary | ICD-10-CM | POA: Diagnosis not present

## 2021-05-14 DIAGNOSIS — C3492 Malignant neoplasm of unspecified part of left bronchus or lung: Secondary | ICD-10-CM

## 2021-05-14 DIAGNOSIS — C7951 Secondary malignant neoplasm of bone: Secondary | ICD-10-CM | POA: Diagnosis not present

## 2021-05-14 DIAGNOSIS — I2510 Atherosclerotic heart disease of native coronary artery without angina pectoris: Secondary | ICD-10-CM | POA: Diagnosis not present

## 2021-05-14 DIAGNOSIS — C7801 Secondary malignant neoplasm of right lung: Secondary | ICD-10-CM | POA: Diagnosis not present

## 2021-05-14 DIAGNOSIS — C7972 Secondary malignant neoplasm of left adrenal gland: Secondary | ICD-10-CM | POA: Insufficient documentation

## 2021-05-14 NOTE — Progress Notes (Signed)
See MD note for nursing evaluation. °

## 2021-05-14 NOTE — Telephone Encounter (Signed)
Pharmacy Transitions of Care Follow-up Telephone Call  Date of discharge: 05/06/2021   How have you been since you were released from the hospital? Excellent   Medication changes made at discharge:  - START: Eliquis, metoprolol  - STOPPED: clopidogrel, ibuprofen, MVI  - CHANGED: none  Medication changes verified by the patient? Yes (Yes/No)    Medication Accessibility:  Home Pharmacy: NA   Was the patient provided with refills on discharged medications? No   Medication Review: APIXABAN (ELIQUIS)  Apixaban 10 mg BID initiated . Will switch to apixaban 5 mg BID after 7 days (DATE/).  - Discussed importance of taking medication around the same time everyday  - Reviewed potential DDIs with patient  - Advised patient of medications to avoid (NSAIDs, ASA)  - Educated that Tylenol (acetaminophen) will be the preferred analgesic to prevent risk of bleeding  - Emphasized importance of monitoring for signs and symptoms of bleeding (abnormal bruising, prolonged bleeding, nose bleeds, bleeding from gums, discolored urine, black tarry stools)  - Advised patient to alert all providers of anticoagulation therapy prior to starting a new medication or having a procedure   Follow-up Appointments:  PCP Hospital f/u appt confirmed? Yes Scheduled to see Laurann Montana on 10/27 @ 2:45.   [facility] f/u appt confirmed? Yes Scheduled to see Turner on 11/15 @ 1:25.   If their condition worsens, is the pt aware to call PCP or go to the Emergency Dept.? Yes  Final Patient Assessment: Patient reports he is doing much better.  Reviewed medications.

## 2021-05-15 ENCOUNTER — Encounter: Payer: Self-pay | Admitting: *Deleted

## 2021-05-15 NOTE — Progress Notes (Signed)
Patient seen by radiation oncology. At this time will defer on radiation treatment, but this remains an option in the future.   Oncology Nurse Navigator Documentation  Oncology Nurse Navigator Flowsheets 05/15/2021  Abnormal Finding Date -  Confirmed Diagnosis Date -  Diagnosis Status -  Planned Course of Treatment -  Phase of Treatment -  Chemotherapy Actual Start Date: -  Chemotherapy Actual End Date: -  Surgery Actual Start Date: -  Navigator Follow Up Date: 05/20/2021  Navigator Follow Up Reason: Follow-up Appointment  Navigator Location [facility]  Referral Date to RadOnc/MedOnc -  Navigator Encounter Type Appt/Treatment Plan Review  Telephone -  Treatment Initiated Date -  Patient Visit Type MedOnc  Treatment Phase Active Tx  Barriers/Navigation Needs Coordination of Care;Education  Education -  Interventions None Required  Acuity Level 2-Minimal Needs (1-2 Barriers Identified)  Coordination of Care -  Education Method -  Support Groups/Services Friends and Family  Time Spent with Patient 15

## 2021-05-19 ENCOUNTER — Encounter: Payer: Self-pay | Admitting: Hematology & Oncology

## 2021-05-19 ENCOUNTER — Telehealth: Payer: Self-pay | Admitting: Pharmacy Technician

## 2021-05-19 ENCOUNTER — Encounter: Payer: Self-pay | Admitting: *Deleted

## 2021-05-19 NOTE — Telephone Encounter (Signed)
Oral Oncology Patient Advocate Encounter  Received notification from [employer]  that patient has been successfully enrolled into their program to receive Lumakras from the manufacturer at $0 out of pocket until 07/19/21.    Patient voiced previously that income was too high for assistance and they would just pay the copay.  Glad the wife applied and got an approval.    Specialty Pharmacy that will dispense medication is [facility].  Patient knows to call the office with questions or concerns.   Oral Oncology Clinic will continue to follow.  [facility] Phone [phone] Fax [phone] 05/19/2021 11:59 AM

## 2021-05-20 ENCOUNTER — Inpatient Hospital Stay (HOSPITAL_BASED_OUTPATIENT_CLINIC_OR_DEPARTMENT_OTHER): Payer: Medicare Other | Admitting: Family

## 2021-05-20 ENCOUNTER — Inpatient Hospital Stay: Payer: Medicare Other

## 2021-05-20 ENCOUNTER — Encounter: Payer: Self-pay | Admitting: *Deleted

## 2021-05-20 ENCOUNTER — Other Ambulatory Visit: Payer: Self-pay

## 2021-05-20 ENCOUNTER — Inpatient Hospital Stay: Payer: Medicare Other | Attending: Hematology & Oncology

## 2021-05-20 ENCOUNTER — Encounter: Payer: Self-pay | Admitting: Family

## 2021-05-20 VITALS — BP 114/91 | HR 60 | Temp 98.6°F | Resp 17 | Wt 198.4 lb

## 2021-05-20 DIAGNOSIS — I10 Essential (primary) hypertension: Secondary | ICD-10-CM | POA: Diagnosis not present

## 2021-05-20 DIAGNOSIS — C7951 Secondary malignant neoplasm of bone: Secondary | ICD-10-CM | POA: Insufficient documentation

## 2021-05-20 DIAGNOSIS — C3492 Malignant neoplasm of unspecified part of left bronchus or lung: Secondary | ICD-10-CM | POA: Diagnosis not present

## 2021-05-20 DIAGNOSIS — C7972 Secondary malignant neoplasm of left adrenal gland: Secondary | ICD-10-CM | POA: Insufficient documentation

## 2021-05-20 DIAGNOSIS — Z79899 Other long term (current) drug therapy: Secondary | ICD-10-CM | POA: Insufficient documentation

## 2021-05-20 DIAGNOSIS — Z1389 Encounter for screening for other disorder: Secondary | ICD-10-CM | POA: Diagnosis not present

## 2021-05-20 DIAGNOSIS — G4733 Obstructive sleep apnea (adult) (pediatric): Secondary | ICD-10-CM | POA: Diagnosis not present

## 2021-05-20 DIAGNOSIS — F40241 Acrophobia: Secondary | ICD-10-CM | POA: Diagnosis not present

## 2021-05-20 DIAGNOSIS — E039 Hypothyroidism, unspecified: Secondary | ICD-10-CM

## 2021-05-20 DIAGNOSIS — D509 Iron deficiency anemia, unspecified: Secondary | ICD-10-CM

## 2021-05-20 DIAGNOSIS — Z86711 Personal history of pulmonary embolism: Secondary | ICD-10-CM | POA: Diagnosis not present

## 2021-05-20 DIAGNOSIS — C3412 Malignant neoplasm of upper lobe, left bronchus or lung: Secondary | ICD-10-CM | POA: Diagnosis not present

## 2021-05-20 DIAGNOSIS — C7971 Secondary malignant neoplasm of right adrenal gland: Secondary | ICD-10-CM | POA: Diagnosis not present

## 2021-05-20 DIAGNOSIS — I5020 Unspecified systolic (congestive) heart failure: Secondary | ICD-10-CM | POA: Diagnosis not present

## 2021-05-20 DIAGNOSIS — I2510 Atherosclerotic heart disease of native coronary artery without angina pectoris: Secondary | ICD-10-CM | POA: Diagnosis not present

## 2021-05-20 DIAGNOSIS — Z0000 Encounter for general adult medical examination without abnormal findings: Secondary | ICD-10-CM | POA: Diagnosis not present

## 2021-05-20 LAB — CBC WITH DIFFERENTIAL (CANCER CENTER ONLY)
Abs Immature Granulocytes: 0.01 10*3/uL (ref 0.00–0.07)
Basophils Absolute: 0 10*3/uL (ref 0.0–0.1)
Basophils Relative: 0 %
Eosinophils Absolute: 0.1 10*3/uL (ref 0.0–0.5)
Eosinophils Relative: 1 %
HCT: 35.1 % — ABNORMAL LOW (ref 39.0–52.0)
Hemoglobin: 11.2 g/dL — ABNORMAL LOW (ref 13.0–17.0)
Immature Granulocytes: 0 %
Lymphocytes Relative: 15 %
Lymphs Abs: 0.9 10*3/uL (ref 0.7–4.0)
MCH: 30.7 pg (ref 26.0–34.0)
MCHC: 31.9 g/dL (ref 30.0–36.0)
MCV: 96.2 fL (ref 80.0–100.0)
Monocytes Absolute: 0.5 10*3/uL (ref 0.1–1.0)
Monocytes Relative: 8 %
Neutro Abs: 4.4 10*3/uL (ref 1.7–7.7)
Neutrophils Relative %: 76 %
Platelet Count: 220 10*3/uL (ref 150–400)
RBC: 3.65 MIL/uL — ABNORMAL LOW (ref 4.22–5.81)
RDW: 15 % (ref 11.5–15.5)
WBC Count: 5.9 10*3/uL (ref 4.0–10.5)
nRBC: 0 % (ref 0.0–0.2)

## 2021-05-20 LAB — CMP (CANCER CENTER ONLY)
ALT: 12 U/L (ref 0–44)
AST: 13 U/L — ABNORMAL LOW (ref 15–41)
Albumin: 4.3 g/dL (ref 3.5–5.0)
Alkaline Phosphatase: 83 U/L (ref 38–126)
Anion gap: 8 (ref 5–15)
BUN: 20 mg/dL (ref 8–23)
CO2: 29 mmol/L (ref 22–32)
Calcium: 9.7 mg/dL (ref 8.9–10.3)
Chloride: 102 mmol/L (ref 98–111)
Creatinine: 0.91 mg/dL (ref 0.61–1.24)
GFR, Estimated: >60 mL/min (ref >60)
Glucose, Bld: 95 mg/dL (ref 70–99)
Potassium: 4 mmol/L (ref 3.5–5.1)
Sodium: 139 mmol/L (ref 135–145)
Total Bilirubin: 0.3 mg/dL (ref 0.3–1.2)
Total Protein: 7.5 g/dL (ref 6.5–8.1)

## 2021-05-20 LAB — LACTATE DEHYDROGENASE: LDH: 161 U/L (ref 98–192)

## 2021-05-20 MED ORDER — DENOSUMAB 120 MG/1.7ML ~~LOC~~ SOLN
120.0000 mg | Freq: Once | SUBCUTANEOUS | Status: AC
  Administered 2021-05-20: 120 mg via SUBCUTANEOUS
  Filled 2021-05-20: qty 1.7

## 2021-05-20 NOTE — Progress Notes (Signed)
Hematology and Oncology Follow Up Visit  John Hinton [id number] 30-Dec-1945 75 y.o. 05/20/2021   Principle Diagnosis:  Stage IVA (T3N2N0) adenocarcinoma of the LUL -- resected -- (+) KRAS -- progression - metastasis to right lung, bilateral adrenal glands, bony metastasis to T1, Y 10 and left iliac bone PE involving the right lower lobe and right middle lobe branches, no right heart strain noted  Past Therapy: S/P cycle 6 of Carbo/Alimta/Pembrolizumab Alimta/pembrolizumab-maintenance therapy s/p cycle 3/4  - start on 11/28/2020 --Alimta discontinued after cycle 2   Current Therapy:        Lumakras 960 mg po q day -- started on 05/02/2021 Xgeva every 3 months - due again 08/2021 Eliquis 5 mg PO BID   Interim History:  John Hinton is here today with John Hinton wife for follow-up and Xgeva. He is doing fairly well but has noted a runny nose since starting the Lumakras 2 weeks ago.  No fever, chills, n/v, cough, rash, SOB, chest pain, palpitations, abdominal pain or changes in bladder habits.  John Hinton diarrhea has resolved.  He had 1 episode of dizziness while playing a video game. This resolved when he took a break.  He is doing well on Eliquis and taking as prescribed. He states that the pharmacist with John Hinton cardiologist recommended he also continue John Hinton baby aspirin daily as well. No blood loss noted. No bruising or petechiae.  No swelling or tenderness in John Hinton extremities at this time. Pedal pulses are 2+.  No falls or syncope to report.  He had a cold sore come up on John Hinton lip for a day or two. This resolved with using Abreva.  He is eating well and staying hydrated. John Hinton weight is stable at 198 lbs.    ECOG Performance Status: 1 - Symptomatic but completely ambulatory  Medications:  Allergies as of 05/20/2021       Reactions   Amlodipine Swelling   Swelling of ankles        Medication List        Accurate as of May 20, 2021  1:31 PM. If you have any questions, ask your nurse or  doctor.          acetaminophen 500 MG tablet Commonly known as: TYLENOL Take 1,000 mg by mouth every 6 (six) hours as needed for moderate pain or headache.   aspirin 81 MG tablet Take 81 mg by mouth at bedtime.   B-12 PO Take 1,000 mcg by mouth daily.   cholecalciferol 1000 units tablet Commonly known as: VITAMIN D Take 1,000 Units by mouth at bedtime.   Eliquis DVT/PE Starter Pack Generic drug: Apixaban Starter Pack (21m and 522m Take as directed on package: start with two-57m82mablets twice daily for 7 days. On day 8, switch to one-57mg94mblet twice daily.   Entresto 24-26 MG Generic drug: sacubitril-valsartan Take 1 tablet by mouth 2 (two) times daily.   ezetimibe 10 MG tablet Commonly known as: ZETIA Take 1 tablet (10 mg total) by mouth daily. What changed: when to take this   folic acid 400 716 tablet Commonly known as: FOLVITE Take 400 mcg by mouth daily.   Lumakras 120 MG Tabs Generic drug: sotorasib Take 8 tablets (960 mg) by mouth daily after breakfast.   metoprolol succinate 25 MG 24 hr tablet Commonly known as: TOPROL-XL Take 1 tablet (25 mg total) by mouth at bedtime.   nitroGLYCERIN 0.4 MG SL tablet Commonly known as: NITROSTAT Place 1 tablet (0.4 mg total) under the tongue every  5 (five) minutes as needed for chest pain.   rosuvastatin 40 MG tablet Commonly known as: CRESTOR TAKE ONE TABLET BY MOUTH EVERY NIGHT AT BEDTIME   sertraline 50 MG tablet Commonly known as: ZOLOFT Take 50 mg by mouth in the morning and at bedtime.   spironolactone 25 MG tablet Commonly known as: ALDACTONE TAKE ONE TABLET BY MOUTH DAILY   traMADol 50 MG tablet Commonly known as: ULTRAM Take 1 tablet (50 mg total) by mouth every 6 (six) hours as needed. What changed: reasons to take this        Allergies:  Allergies  Allergen Reactions   Amlodipine Swelling    Swelling of ankles    Past Medical History, Surgical history, Social history, and Family  History were reviewed and updated.  Review of Systems: All other 10 point review of systems is negative.   Physical Exam:  weight is 198 lb 6.4 oz (90 kg). John Hinton oral temperature is 98.6 F (37 C). John Hinton blood pressure is 114/91 (abnormal) and John Hinton pulse is 60. John Hinton respiration is 17 and oxygen saturation is 98%.   Wt Readings from Last 3 Encounters:  05/20/21 198 lb 6.4 oz (90 kg)  05/14/21 197 lb (89.4 kg)  05/06/21 192 lb 6.4 oz (87.3 kg)    Ocular: Sclerae unicteric, pupils equal, round and reactive to light Ear-nose-throat: Oropharynx clear, dentition fair Lymphatic: No cervical or supraclavicular adenopathy Lungs no rales or rhonchi, good excursion bilaterally Heart regular rate and rhythm, no murmur appreciated Abd soft, nontender, positive bowel sounds MSK no focal spinal tenderness, no joint edema Neuro: non-focal, well-oriented, appropriate affect Breasts: Deferred   Lab Results  Component Value Date   WBC 5.9 05/20/2021   HGB 11.2 (L) 05/20/2021   HCT 35.1 (L) 05/20/2021   MCV 96.2 05/20/2021   PLT 220 05/20/2021   Lab Results  Component Value Date   FERRITIN 758 (H) 10/25/2020   IRON 69 10/25/2020   TIBC 321 10/25/2020   UIBC 252 10/25/2020   IRONPCTSAT 22 10/25/2020   Lab Results  Component Value Date   RBC 3.65 (L) 05/20/2021   No results found for: KPAFRELGTCHN, LAMBDASER, KAPLAMBRATIO No results found for: IGGSERUM, IGA, IGMSERUM No results found for: Kathrynn Ducking, MSPIKE, SPEI   Chemistry      Component Value Date/Time   NA 132 (L) 05/05/2021 0438   NA 139 04/08/2021 1032   K 4.2 05/06/2021 0252   CL 100 05/05/2021 0438   CO2 22 05/05/2021 0438   BUN 24 (H) 05/05/2021 0438   BUN 15 04/08/2021 1032   CREATININE 1.00 05/05/2021 0438   CREATININE 0.88 03/19/2021 0814   CREATININE 1.01 03/26/2016 1009      Component Value Date/Time   CALCIUM 8.6 (L) 05/05/2021 0438   ALKPHOS 80 05/04/2021 1646   AST  20 05/04/2021 1646   AST 23 03/19/2021 0814   ALT 19 05/04/2021 1646   ALT 18 03/19/2021 0814   BILITOT 0.7 05/04/2021 1646   BILITOT 0.3 04/08/2021 1032   BILITOT 0.3 03/19/2021 0814       Impression and Plan: John Hinton is a very pleasant 75 yo caucasian male with an atypical stage IV lung cancer which was previously resected followed by systemic chemotherapy and maintenance pembrolizumab. He has recurrent disease systemically.  So far, he is tolerating Lumakras nicely.  He received Delton See today as planned.  At this time radiation oncology will follow-up as needed. No treatment indicated at  this time.  Follow-up in 1 months.  We will plan to repeat scans once he has been on treatment for 3 months (mid January 2023).  They can contact our office with any questions or concerns.   Lottie Dawson, NP 11/1/20221:31 PM

## 2021-05-20 NOTE — Patient Instructions (Signed)
Denosumab injection What is this medication? DENOSUMAB (den oh sue mab) slows bone breakdown. Prolia is used to treat osteoporosis in women after menopause and in men, and in people who are taking corticosteroids for 6 months or more. Xgeva is used to treat a high calcium level due to cancer and to prevent bone fractures and other bone problems caused by multiple myeloma or cancer bone metastases. Xgeva is also used to treat giant cell tumor of the bone. This medicine may be used for other purposes; ask your health care provider or pharmacist if you have questions. COMMON BRAND NAME(S): Prolia, XGEVA What should I tell my care team before I take this medication? They need to know if you have any of these conditions: dental disease having surgery or tooth extraction infection kidney disease low levels of calcium or Vitamin D in the blood malnutrition on hemodialysis skin conditions or sensitivity thyroid or parathyroid disease an unusual reaction to denosumab, other medicines, foods, dyes, or preservatives pregnant or trying to get pregnant breast-feeding How should I use this medication? This medicine is for injection under the skin. It is given by a health care professional in a hospital or clinic setting. A special MedGuide will be given to you before each treatment. Be sure to read this information carefully each time. For Prolia, talk to your pediatrician regarding the use of this medicine in children. Special care may be needed. For Xgeva, talk to your pediatrician regarding the use of this medicine in children. While this drug may be prescribed for children as young as 13 years for selected conditions, precautions do apply. Overdosage: If you think you have taken too much of this medicine contact a poison control center or emergency room at once. NOTE: This medicine is only for you. Do not share this medicine with others. What if I miss a dose? It is important not to miss your dose.  Call your doctor or health care professional if you are unable to keep an appointment. What may interact with this medication? Do not take this medicine with any of the following medications: other medicines containing denosumab This medicine may also interact with the following medications: medicines that lower your chance of fighting infection steroid medicines like prednisone or cortisone This list may not describe all possible interactions. Give your health care provider a list of all the medicines, herbs, non-prescription drugs, or dietary supplements you use. Also tell them if you smoke, drink alcohol, or use illegal drugs. Some items may interact with your medicine. What should I watch for while using this medication? Visit your doctor or health care professional for regular checks on your progress. Your doctor or health care professional may order blood tests and other tests to see how you are doing. Call your doctor or health care professional for advice if you get a fever, chills or sore throat, or other symptoms of a cold or flu. Do not treat yourself. This drug may decrease your body's ability to fight infection. Try to avoid being around people who are sick. You should make sure you get enough calcium and vitamin D while you are taking this medicine, unless your doctor tells you not to. Discuss the foods you eat and the vitamins you take with your health care professional. See your dentist regularly. Brush and floss your teeth as directed. Before you have any dental work done, tell your dentist you are receiving this medicine. Do not become pregnant while taking this medicine or for 5 months after   stopping it. Talk with your doctor or health care professional about your birth control options while taking this medicine. Women should inform their doctor if they wish to become pregnant or think they might be pregnant. There is a potential for serious side effects to an unborn child. Talk to  your health care professional or pharmacist for more information. What side effects may I notice from receiving this medication? Side effects that you should report to your doctor or health care professional as soon as possible: allergic reactions like skin rash, itching or hives, swelling of the face, lips, or tongue bone pain breathing problems dizziness jaw pain, especially after dental work redness, blistering, peeling of the skin signs and symptoms of infection like fever or chills; cough; sore throat; pain or trouble passing urine signs of low calcium like fast heartbeat, muscle cramps or muscle pain; pain, tingling, numbness in the hands or feet; seizures unusual bleeding or bruising unusually weak or tired Side effects that usually do not require medical attention (report to your doctor or health care professional if they continue or are bothersome): constipation diarrhea headache joint pain loss of appetite muscle pain runny nose tiredness upset stomach This list may not describe all possible side effects. Call your doctor for medical advice about side effects. You may report side effects to FDA at 1-800-FDA-1088. Where should I keep my medication? This medicine is only given in a clinic, doctor's office, or other health care setting and will not be stored at home. NOTE: This sheet is a summary. It may not cover all possible information. If you have questions about this medicine, talk to your doctor, pharmacist, or health care provider.  2022 Elsevier/Gold Standard (2017-11-12 16:10:44)

## 2021-05-21 ENCOUNTER — Telehealth: Payer: Self-pay | Admitting: *Deleted

## 2021-05-21 ENCOUNTER — Encounter: Payer: Self-pay | Admitting: Hematology & Oncology

## 2021-05-21 ENCOUNTER — Encounter: Payer: Self-pay | Admitting: Family

## 2021-05-21 LAB — TSH: TSH: 2.141 u[IU]/mL (ref 0.320–4.118)

## 2021-05-21 NOTE — Telephone Encounter (Signed)
Per 05/21/21 los - called and gave upcoming appointments - confirmed

## 2021-05-21 NOTE — Progress Notes (Signed)
Oncology Nurse Navigator Documentation  Oncology Nurse Navigator Flowsheets 05/20/2021  Abnormal Finding Date -  Confirmed Diagnosis Date -  Diagnosis Status -  Planned Course of Treatment -  Phase of Treatment -  Chemotherapy Actual Start Date: -  Chemotherapy Actual End Date: -  Surgery Actual Start Date: -  Navigator Follow Up Date: 06/20/2021  Navigator Follow Up Reason: Follow-up Appointment  Navigator Location [facility]  Referral Date to RadOnc/MedOnc -  Navigator Encounter Type Appt/Treatment Plan Review  Telephone -  Treatment Initiated Date -  Patient Visit Type MedOnc  Treatment Phase Active Tx  Barriers/Navigation Needs Coordination of Care;Education  Education -  Interventions None Required  Acuity Level 2-Minimal Needs (1-2 Barriers Identified)  Coordination of Care -  Education Method -  Support Groups/Services Friends and Family  Time Spent with Patient 15

## 2021-05-29 ENCOUNTER — Other Ambulatory Visit: Payer: Self-pay | Admitting: Thoracic Surgery (Cardiothoracic Vascular Surgery)

## 2021-05-29 DIAGNOSIS — C3490 Malignant neoplasm of unspecified part of unspecified bronchus or lung: Secondary | ICD-10-CM

## 2021-06-01 ENCOUNTER — Other Ambulatory Visit: Payer: Self-pay | Admitting: Cardiology

## 2021-06-02 NOTE — Telephone Encounter (Signed)
Rx(s) sent to pharmacy electronically.  

## 2021-06-03 ENCOUNTER — Encounter: Payer: Self-pay | Admitting: Cardiology

## 2021-06-03 ENCOUNTER — Other Ambulatory Visit: Payer: Self-pay

## 2021-06-03 ENCOUNTER — Ambulatory Visit (INDEPENDENT_AMBULATORY_CARE_PROVIDER_SITE_OTHER): Payer: Medicare Other | Admitting: Cardiology

## 2021-06-03 VITALS — BP 126/74 | HR 63 | Ht 71.0 in | Wt 199.6 lb

## 2021-06-03 DIAGNOSIS — E7800 Pure hypercholesterolemia, unspecified: Secondary | ICD-10-CM

## 2021-06-03 DIAGNOSIS — I420 Dilated cardiomyopathy: Secondary | ICD-10-CM

## 2021-06-03 DIAGNOSIS — I10 Essential (primary) hypertension: Secondary | ICD-10-CM

## 2021-06-03 DIAGNOSIS — I2510 Atherosclerotic heart disease of native coronary artery without angina pectoris: Secondary | ICD-10-CM

## 2021-06-03 DIAGNOSIS — R0602 Shortness of breath: Secondary | ICD-10-CM | POA: Diagnosis not present

## 2021-06-03 MED ORDER — EZETIMIBE 10 MG PO TABS: 10.0000 mg | ORAL_TABLET | Freq: Every day | ORAL | 3 refills | Status: DC

## 2021-06-03 MED ORDER — ROSUVASTATIN CALCIUM 40 MG PO TABS: 40.0000 mg | ORAL_TABLET | Freq: Every day | ORAL | 3 refills | Status: DC

## 2021-06-03 MED ORDER — SPIRONOLACTONE 25 MG PO TABS: 25.0000 mg | ORAL_TABLET | Freq: Every day | ORAL | 3 refills | Status: DC

## 2021-06-03 MED ORDER — NITROGLYCERIN 0.4 MG SL SUBL: 0.4000 mg | SUBLINGUAL_TABLET | SUBLINGUAL | 3 refills | Status: DC | PRN

## 2021-06-03 MED ORDER — METOPROLOL SUCCINATE ER 25 MG PO TB24: 25.0000 mg | ORAL_TABLET | Freq: Every day | ORAL | 3 refills | Status: DC

## 2021-06-03 MED ORDER — ENTRESTO 24-26 MG PO TABS: 1.0000 | ORAL_TABLET | Freq: Two times a day (BID) | ORAL | 11 refills | Status: DC

## 2021-06-03 NOTE — Addendum Note (Signed)
Addended by: Antonieta Iba on: 06/03/2021 02:22 PM   Modules accepted: Orders

## 2021-06-03 NOTE — Progress Notes (Signed)
Office Visit  Note   Date:  06/03/2021   ID:  John Hinton, DOB 1946/05/08, MRN [id number]   PCP:  [name], MD  Cardiologist:  Fransico Him, MD Electrophysiologist:  None   Chief Complaint:  CAD, HTN, lipids and OSA  History of Present Illness:    John Hinton is a 75 y.o. male with a hx of ASCAD s/p CABG and then PCI of SVG to OM1.  Repeat heart cath for unstable angina on 01/27/2017 showed severe focal in-stent restenosis in the SVG to OM stent status post balloon angioplasty.  All of the grafts were patent.  He has a history of hyperlipidemia and hypertension as well as moderate OSA with a PSG showing an AHI of 16.5/hr and is on CPAP at 8cm H2O.  Unfortunately he was dx with stage 4a adenoCA of the LUL lung s/p resection now s/p chemo with Carbo/Alimta/Pembrolizumab and on maintenance with Pembrolizumab. He is now in remission and his SOB has significantly improved.   He was hospitalized on 05/06/2021 due to chest pain and shortness of breath with confusion.  He was found to have submassive PE and was discharged home on Eliquis.  His thrombus was not amenable to embolectomy due to distal location. 2D echo showed EF 45-50% with G1DD, PASP 12mmHg, mild LAE, mild MR.  He is here today for followup and is doing well.  He denies any chest pain or pressure, SOB, DOE (except for walking up a lot of steps), PND, orthopnea, LE edema, dizziness, palpitations or syncope. He is compliant with his meds and is tolerating meds with no SE.     Prior CV studies:   The following studies were reviewed today:  Hospital notes from [facility]  Past Medical History:  Diagnosis Date   BPH (benign prostatic hypertrophy)    Bradycardia    a. H/o asymptomatic bradycardia.   Coronary artery disease    a. s/p CABG ~2007 with LIMA to LAD, SVG seq to OM1 and OM2, SVG to PDA and SVG to diag. b. Abnormal nuc 03/2015 - s/p DES to SVG-OM1-OM2. 01/29/17 POBA with cutting balloon to SVG-->OM.  No ischemia Lexi  myoview 2021   DCM (dilated cardiomyopathy) ([place]) 09/17/2015   mixed by cMRI 03/2021 with EF 39% with non viable infarct in basal to mid lateral wall and LGE pattern c/w nonischemic component as well. Mild RV dysfunction RVEF 42%.   Dyslipidemia    Dyspnea    r/t lung mass   ED (erectile dysfunction)    Fear of heights    Goals of care, counseling/discussion 06/19/2020   Hypertension    Non-small cell carcinoma of lung, stage 4, left ([place]) 06/19/2020   OSA (obstructive sleep apnea) 07/12/2015   Moderate OSA with AHI 16.5/hr now on CPAP at 8cm H2O, uses cpap nightly      RBBB    Shoulder, capsulitis, adhesive    Left Shoulder   Past Surgical History:  Procedure Laterality Date   APPENDECTOMY     BRONCHIAL BIOPSY  05/09/2020   Procedure: BRONCHIAL BIOPSIES;  Surgeon: [name], DO;  Location: [facility];  Service: Pulmonary;;   BRONCHIAL BRUSHINGS  05/09/2020   Procedure: BRONCHIAL BRUSHINGS;  Surgeon: [name], DO;  Location: [facility];  Service: Pulmonary;;   BRONCHIAL NEEDLE ASPIRATION BIOPSY  05/09/2020   Procedure: BRONCHIAL NEEDLE ASPIRATION BIOPSIES;  Surgeon: [name], DO;  Location: [facility];  Service: Pulmonary;;   BRONCHIAL WASHINGS  05/09/2020   Procedure: BRONCHIAL  WASHINGS;  Surgeon: [name], DO;  Location: [facility];  Service: Pulmonary;;   CARDIAC CATHETERIZATION N/A 04/05/2015   Procedure: Left Heart Cath and Coronary Angiography;  Surgeon: Sherren Mocha, MD;  Location: [facility];  Service: Cardiovascular;  Laterality: N/A;   COLONOSCOPY     CORONARY ARTERY BYPASS GRAFT     w/LIMA to LAD, seq SVG to OM1 and OM2, SVG to PDA and SVG to Diagonal   CORONARY BALLOON ANGIOPLASTY N/A 01/29/2017   Procedure: Coronary Balloon Angioplasty;  Surgeon: Martinique, Peter M, MD;  Location: [facility];  Service: Cardiovascular;  Laterality: N/A;   FIDUCIAL MARKER PLACEMENT  05/09/2020   Procedure: FIDUCIAL MARKER PLACEMENT;   Surgeon: [name], DO;  Location: [facility];  Service: Pulmonary;;   INTERCOSTAL NERVE BLOCK Left 05/22/2020   Procedure: INTERCOSTAL NERVE BLOCK;  Surgeon: Lajuana Matte, MD;  Location: [facility];  Service: Thoracic;  Laterality: Left;   LEFT HEART CATH AND CORS/GRAFTS ANGIOGRAPHY N/A 01/29/2017   Procedure: Left Heart Cath and Cors/Grafts Angiography;  Surgeon: Martinique, Peter M, MD;  Location: [facility];  Service: Cardiovascular;  Laterality: N/A;   NODE DISSECTION N/A 05/22/2020   Procedure: NODE DISSECTION;  Surgeon: Lajuana Matte, MD;  Location: [facility];  Service: Thoracic;  Laterality: N/A;   VIDEO BRONCHOSCOPY WITH ENDOBRONCHIAL NAVIGATION N/A 05/09/2020   Procedure: VIDEO BRONCHOSCOPY WITH ENDOBRONCHIAL NAVIGATION;  Surgeon: [name], DO;  Location: [facility];  Service: Pulmonary;  Laterality: N/A;   VIDEO BRONCHOSCOPY WITH ENDOBRONCHIAL ULTRASOUND N/A 05/09/2020   Procedure: VIDEO BRONCHOSCOPY WITH ENDOBRONCHIAL ULTRASOUND;  Surgeon: [name], DO;  Location: [facility];  Service: Pulmonary;  Laterality: N/A;     Current Meds  Medication Sig   acetaminophen (TYLENOL) 500 MG tablet Take 1,000 mg by mouth every 6 (six) hours as needed for moderate pain or headache.   apixaban (ELIQUIS) 5 MG TABS tablet Take 5 mg by mouth 2 (two) times daily.   aspirin 81 MG tablet Take 81 mg by mouth at bedtime.    cholecalciferol (VITAMIN D) 1000 UNITS tablet Take 1,000 Units by mouth at bedtime.    Cyanocobalamin (B-12 PO) Take 1,000 mcg by mouth daily.    ezetimibe (ZETIA) 10 MG tablet Take 1 tablet (10 mg total) by mouth daily.   folic acid (FOLVITE) 202 MCG tablet Take 400 mcg by mouth daily.   metoprolol succinate (TOPROL-XL) 25 MG 24 hr tablet TAKE ONE TABLET BY MOUTH EVERY NIGHT AT BEDTIME   nitroGLYCERIN (NITROSTAT) 0.4 MG SL tablet Place 1 tablet (0.4 mg total) under the tongue every 5 (five) minutes as needed for chest pain.   rosuvastatin  (CRESTOR) 40 MG tablet TAKE ONE TABLET BY MOUTH EVERY NIGHT AT BEDTIME   sacubitril-valsartan (ENTRESTO) 24-26 MG Take 1 tablet by mouth 2 (two) times daily.   sertraline (ZOLOFT) 50 MG tablet Take 50 mg by mouth in the morning and at bedtime.    sotorasib (LUMAKRAS) 120 MG TABS Take 8 tablets (960 mg) by mouth daily after breakfast.   spironolactone (ALDACTONE) 25 MG tablet TAKE ONE TABLET BY MOUTH DAILY   traMADol (ULTRAM) 50 MG tablet Take 1 tablet (50 mg total) by mouth every 6 (six) hours as needed.     Allergies:   Amlodipine   Social History   Tobacco Use   Smoking status: Former    Types: Cigarettes    Quit date: 07/21/1983    Years since quitting:  37.8   Smokeless tobacco: Former  Scientific laboratory technician Use: Never used  Substance Use Topics   Alcohol use: Yes    Alcohol/week: 20.0 - 28.0 standard drinks    Types: 20 - 28 Standard drinks or equivalent per week    Comment: daily - 20-28 /week   Drug use: No     Family Hx: The patient's family history includes Cancer in his sister; Heart disease in his father; Heart failure in his father; Lung cancer in his father and mother.  ROS:   Please see the history of present illness.     All other systems reviewed and are negative.   Labs/Other Tests and Data Reviewed:    Recent Labs: 05/04/2021: B Natriuretic Peptide 143.7 05/06/2021: Magnesium 2.1 05/20/2021: ALT 12; BUN 20; Creatinine 0.91; Hemoglobin 11.2; Platelet Count 220; Potassium 4.0; Sodium 139; TSH 2.141   Recent Lipid Panel Lab Results  Component Value Date/Time   CHOL 145 04/08/2021 10:32 AM   TRIG 112 04/08/2021 10:32 AM   HDL 44 04/08/2021 10:32 AM   CHOLHDL 3.3 04/08/2021 10:32 AM   CHOLHDL 2.2 03/26/2016 10:09 AM   LDLCALC 81 04/08/2021 10:32 AM    Wt Readings from Last 3 Encounters:  06/03/21 199 lb 9.6 oz (90.5 kg)  05/20/21 198 lb 6.4 oz (90 kg)  05/14/21 197 lb (89.4 kg)     EKG today showed NSR with RBBB and inferior infarct  Objective:     Vital Signs:  BP 126/74   Pulse 63   Ht 5\' 11"  (1.803 m)   Wt 199 lb 9.6 oz (90.5 kg)   SpO2 98%   BMI 27.84 kg/m    GEN: Well nourished, well developed in no acute distress HEENT: Normal NECK: No JVD; No carotid bruits LYMPHATICS: No lymphadenopathy CARDIAC:RRR, no murmurs, rubs, gallops RESPIRATORY:  Clear to auscultation without rales, wheezing or rhonchi  ABDOMEN: Soft, non-tender, non-distended MUSCULOSKELETAL:  No edema; No deformity  SKIN: Warm and dry NEUROLOGIC:  Alert and oriented x 3 PSYCHIATRIC:  Normal affect    ASSESSMENT & PLAN:      Hypertension -BP is adequately controlled on exam today. -Medicine with spironolactone 25 mg daily, Entresto 24-26 mg twice daily and Toprol-XL 25 mg daily with as needed refills   -Serum creatinine stable at 0.91 and potassium 4 on 05/20/2021  ASCAD  - s/p CABG and then PCI of SVG to OM1.   -Repeat heart cath for unstable angina on 01/27/2017 showed severe focal in-stent restenosis in the SVG to OM stent status post balloon angioplasty.  All other grafts were patent.   -He has not had any anginal symptoms -Continue aspirin 81 mg daily, Plavix 75 mg daily, beta-blocker and statin   Hyperlipidemia  -his LDL goal is < 70.   -LDL was 81, HDL 44, triglycerides 112 and ALT 12 on 04/08/2021 and 05/20/2021 respectively -Continue prescription drug management with Crestor 40 mg daily and Zetia 10 mg daily with as needed refills  -check FLP and ALT and if LDL still not at goal refer to PCP for PCSK9i  DOE/Lung CA/PE -2D echo 02/2021 showed mild LV dysfunction with EF 40-45%, dilated LV -no ischemia on Lexisan myoview in 02/2020 -PFTs were normal -dx with stage 4a adenoCA of the LUL lung s/p resection now s/p chemo with Carbo/Alimta/Pembrolizumab and on maintenance with Pembrolizumab -On anticoagulation for submassive PE several weeks ago  Dilated cardiomyopathy -EF 40 to 45% with dilated LV on echo 02/2021. -He does  not appear volume  overloaded on exam today. -Continue Entresto 24-26 mg twice daily, Spiro 25 mg daily and Toprol-XL 25 mg daily with as needed refills  Medication Adjustments/Labs and Tests Ordered: Current medicines are reviewed at length with the patient today.  Concerns regarding medicines are outlined above.  Tests Ordered: No orders of the defined types were placed in this encounter.   Medication Changes: No orders of the defined types were placed in this encounter.   Disposition:  Follow up in 1 year(s)  Signed, Fransico Him, MD  06/03/2021 1:45 PM     [facility]

## 2021-06-03 NOTE — Patient Instructions (Signed)
Medication Instructions:  Your physician recommends that you continue on your current medications as directed. Please refer to the Current Medication list given to you today.  *If you need a refill on your cardiac medications before your next appointment, please call your pharmacy*  Testing/Procedures: Your physician has requested that you have a lexiscan myoview. For further information please visit [email or web address]. Please follow instruction sheet, as given.  Follow-Up: At [facility], you and your health needs are our priority.  As part of our continuing mission to provide you with exceptional heart care, we have created designated Provider Care Teams.  These Care Teams include your primary Cardiologist (physician) and Advanced Practice Providers (APPs -  Physician Assistants and Nurse Practitioners) who all work together to provide you with the care you need, when you need it.  Your next appointment:   3 month(s)  The format for your next appointment:   In Person  Provider:   Fransico Him, MD

## 2021-06-16 ENCOUNTER — Other Ambulatory Visit: Payer: Self-pay | Admitting: Hematology & Oncology

## 2021-06-16 ENCOUNTER — Other Ambulatory Visit: Payer: Self-pay | Admitting: Cardiology

## 2021-06-16 DIAGNOSIS — C3492 Malignant neoplasm of unspecified part of left bronchus or lung: Secondary | ICD-10-CM

## 2021-06-17 ENCOUNTER — Other Ambulatory Visit: Payer: Medicare Other

## 2021-06-17 ENCOUNTER — Encounter (HOSPITAL_COMMUNITY): Payer: Medicare Other

## 2021-06-18 ENCOUNTER — Telehealth (HOSPITAL_COMMUNITY): Payer: Self-pay

## 2021-06-18 NOTE — Telephone Encounter (Signed)
Spoke with the patient, detailed instructions given. He stated that he would be here for his test. Asked to call back with any questions. S.Heike Pounds EMTP 

## 2021-06-19 ENCOUNTER — Other Ambulatory Visit: Payer: Medicare Other

## 2021-06-19 ENCOUNTER — Other Ambulatory Visit: Payer: Self-pay

## 2021-06-19 ENCOUNTER — Ambulatory Visit (HOSPITAL_COMMUNITY): Payer: Medicare Other | Attending: Cardiovascular Disease

## 2021-06-19 DIAGNOSIS — I519 Heart disease, unspecified: Secondary | ICD-10-CM

## 2021-06-19 DIAGNOSIS — I420 Dilated cardiomyopathy: Secondary | ICD-10-CM | POA: Diagnosis not present

## 2021-06-19 LAB — MYOCARDIAL PERFUSION IMAGING
LV dias vol: 167 mL (ref 62–150)
LV sys vol: 98 mL
Nuc Stress EF: 41 %
Peak HR: 83 {beats}/min
Rest HR: 58 {beats}/min
Rest Nuclear Isotope Dose: 10.9 mCi
SDS: 0
SRS: 2
SSS: 2
ST Depression (mm): 0 mm
Stress Nuclear Isotope Dose: 32.5 mCi
TID: 0.98

## 2021-06-19 LAB — BASIC METABOLIC PANEL
BUN/Creatinine Ratio: 16 (ref 10–24)
BUN: 12 mg/dL (ref 8–27)
CO2: 21 mmol/L (ref 20–29)
Calcium: 9.1 mg/dL (ref 8.6–10.2)
Chloride: 106 mmol/L (ref 96–106)
Creatinine, Ser: 0.77 mg/dL (ref 0.76–1.27)
Glucose: 84 mg/dL (ref 70–99)
Potassium: 4.2 mmol/L (ref 3.5–5.2)
Sodium: 142 mmol/L (ref 134–144)
eGFR: 95 mL/min/{1.73_m2} (ref >59)

## 2021-06-19 MED ORDER — TECHNETIUM TC 99M TETROFOSMIN IV KIT
10.9000 | PACK | Freq: Once | INTRAVENOUS | Status: AC | PRN
  Administered 2021-06-19: 10.9 via INTRAVENOUS
  Filled 2021-06-19: qty 11

## 2021-06-19 MED ORDER — REGADENOSON 0.4 MG/5ML IV SOLN
0.4000 mg | Freq: Once | INTRAVENOUS | Status: AC
  Administered 2021-06-19: 0.4 mg via INTRAVENOUS

## 2021-06-19 MED ORDER — TECHNETIUM TC 99M TETROFOSMIN IV KIT
32.5000 | PACK | Freq: Once | INTRAVENOUS | Status: AC | PRN
  Administered 2021-06-19: 32.5 via INTRAVENOUS
  Filled 2021-06-19: qty 33

## 2021-06-20 ENCOUNTER — Inpatient Hospital Stay (HOSPITAL_BASED_OUTPATIENT_CLINIC_OR_DEPARTMENT_OTHER): Payer: Medicare Other | Admitting: Hematology & Oncology

## 2021-06-20 ENCOUNTER — Inpatient Hospital Stay: Payer: Medicare Other | Attending: Hematology & Oncology

## 2021-06-20 ENCOUNTER — Encounter: Payer: Self-pay | Admitting: Hematology & Oncology

## 2021-06-20 ENCOUNTER — Encounter: Payer: Self-pay | Admitting: *Deleted

## 2021-06-20 VITALS — BP 131/74 | HR 62 | Temp 98.4°F | Resp 18 | Wt 199.0 lb

## 2021-06-20 DIAGNOSIS — I2699 Other pulmonary embolism without acute cor pulmonale: Secondary | ICD-10-CM | POA: Diagnosis not present

## 2021-06-20 DIAGNOSIS — D509 Iron deficiency anemia, unspecified: Secondary | ICD-10-CM

## 2021-06-20 DIAGNOSIS — Z79899 Other long term (current) drug therapy: Secondary | ICD-10-CM | POA: Diagnosis not present

## 2021-06-20 DIAGNOSIS — E039 Hypothyroidism, unspecified: Secondary | ICD-10-CM

## 2021-06-20 DIAGNOSIS — Z7901 Long term (current) use of anticoagulants: Secondary | ICD-10-CM | POA: Diagnosis not present

## 2021-06-20 DIAGNOSIS — C7971 Secondary malignant neoplasm of right adrenal gland: Secondary | ICD-10-CM | POA: Insufficient documentation

## 2021-06-20 DIAGNOSIS — C3412 Malignant neoplasm of upper lobe, left bronchus or lung: Secondary | ICD-10-CM | POA: Insufficient documentation

## 2021-06-20 DIAGNOSIS — C7951 Secondary malignant neoplasm of bone: Secondary | ICD-10-CM | POA: Diagnosis not present

## 2021-06-20 DIAGNOSIS — C3492 Malignant neoplasm of unspecified part of left bronchus or lung: Secondary | ICD-10-CM

## 2021-06-20 DIAGNOSIS — C7972 Secondary malignant neoplasm of left adrenal gland: Secondary | ICD-10-CM | POA: Insufficient documentation

## 2021-06-20 LAB — CBC WITH DIFFERENTIAL (CANCER CENTER ONLY)
Abs Immature Granulocytes: 0.02 10*3/uL (ref 0.00–0.07)
Basophils Absolute: 0 10*3/uL (ref 0.0–0.1)
Basophils Relative: 0 %
Eosinophils Absolute: 0.1 10*3/uL (ref 0.0–0.5)
Eosinophils Relative: 2 %
HCT: 39.1 % (ref 39.0–52.0)
Hemoglobin: 12.3 g/dL — ABNORMAL LOW (ref 13.0–17.0)
Immature Granulocytes: 0 %
Lymphocytes Relative: 14 %
Lymphs Abs: 0.7 10*3/uL (ref 0.7–4.0)
MCH: 32 pg (ref 26.0–34.0)
MCHC: 31.5 g/dL (ref 30.0–36.0)
MCV: 101.8 fL — ABNORMAL HIGH (ref 80.0–100.0)
Monocytes Absolute: 0.5 10*3/uL (ref 0.1–1.0)
Monocytes Relative: 10 %
Neutro Abs: 3.5 10*3/uL (ref 1.7–7.7)
Neutrophils Relative %: 74 %
Platelet Count: 169 10*3/uL (ref 150–400)
RBC: 3.84 MIL/uL — ABNORMAL LOW (ref 4.22–5.81)
RDW: 15.8 % — ABNORMAL HIGH (ref 11.5–15.5)
WBC Count: 4.7 10*3/uL (ref 4.0–10.5)
nRBC: 0 % (ref 0.0–0.2)

## 2021-06-20 LAB — CMP (CANCER CENTER ONLY)
ALT: 24 U/L (ref 0–44)
AST: 27 U/L (ref 15–41)
Albumin: 4.5 g/dL (ref 3.5–5.0)
Alkaline Phosphatase: 109 U/L (ref 38–126)
Anion gap: 7 (ref 5–15)
BUN: 14 mg/dL (ref 8–23)
CO2: 27 mmol/L (ref 22–32)
Calcium: 9.7 mg/dL (ref 8.9–10.3)
Chloride: 107 mmol/L (ref 98–111)
Creatinine: 0.97 mg/dL (ref 0.61–1.24)
GFR, Estimated: >60 mL/min (ref >60)
Glucose, Bld: 85 mg/dL (ref 70–99)
Potassium: 4.4 mmol/L (ref 3.5–5.1)
Sodium: 141 mmol/L (ref 135–145)
Total Bilirubin: 0.3 mg/dL (ref 0.3–1.2)
Total Protein: 7.5 g/dL (ref 6.5–8.1)

## 2021-06-20 LAB — TSH: TSH: 2.248 u[IU]/mL (ref 0.320–4.118)

## 2021-06-20 LAB — LACTATE DEHYDROGENASE: LDH: 162 U/L (ref 98–192)

## 2021-06-20 NOTE — Progress Notes (Signed)
Patient here for follow up. He is doing well on his current therapy. Patient will need PET scan prior to his next appointment.   PET scheduled for 07/08/2021. Called patient and he is aware of PET appointment including date, time and location. He is also aware of PET prep. Radiology info sheet with this information also mailed to patient home.  Oncology Nurse Navigator Documentation  Oncology Nurse Navigator Flowsheets 06/20/2021  Abnormal Finding Date -  Confirmed Diagnosis Date -  Diagnosis Status -  Planned Course of Treatment -  Phase of Treatment -  Chemotherapy Actual Start Date: -  Chemotherapy Actual End Date: -  Surgery Actual Start Date: -  Navigator Follow Up Date: 07/08/2021  Navigator Follow Up Reason: Scan Review  Navigator Location [facility]  Referral Date to RadOnc/MedOnc -  Navigator Encounter Type Appt/Treatment Plan Review;Telephone  Telephone Appt Confirmation/Clarification;Education;Outgoing Call  Treatment Initiated Date -  Patient Visit Type MedOnc  Treatment Phase Active Tx  Barriers/Navigation Needs Coordination of Care;Education  Education Other  Interventions Coordination of Care;Education;Psycho-Social Support  Acuity Level 2-Minimal Needs (1-2 Barriers Identified)  Coordination of Care Appts;Radiology  Education Method Verbal;Teach-back  Support Groups/Services Friends and Family  Time Spent with Patient 30

## 2021-06-20 NOTE — Progress Notes (Signed)
Hematology and Oncology Follow Up Visit  John Hinton [id number] May 19, 1946 75 y.o. 06/20/2021   Principle Diagnosis:  Stage IVA (T3N2N0) adenocarcinoma of the LUL -- resected -- (+) KRAS -- progression - metastasis to right lung, bilateral adrenal glands, bony metastasis to T1, Y 10 and left iliac bone PE involving the right lower lobe and right middle lobe branches, no right heart strain noted  Past Therapy: S/P cycle 6 of Carbo/Alimta/Pembrolizumab Alimta/pembrolizumab-maintenance therapy s/p cycle 3/4  - start on 11/28/2020 --Alimta discontinued after cycle 2   Current Therapy:        Lumakras 960 mg po q day -- started on 05/02/2021 Xgeva every 3 months - due again 08/2021 Eliquis 5 mg PO BID   Interim History:  John Hinton is here today for follow-up.  He is doing quite well.  He is got back from [facility]  He and all of the family went to [facility] for Thanksgiving.  There was fine some new city to go to for Thanksgiving.  They really enjoyed [place].  He is doing well on the Lumakras.  He has had a little bit of diarrhea.  I think he takes Imodium or Lomotil for this.  He has had no problems with pain.  He has had no problems with nausea or vomiting.  He has had no cough or shortness of breath.  There has been no rashes.  He has had no leg swelling.  Overall, I would say his performance status is probably ECOG 1.  He is on Eliquis because of pulmonary embolism.  He is doing well on the Eliquis.  I probably would keep him on Eliquis long-term.  Medications:  Allergies as of 06/20/2021       Reactions   Amlodipine Swelling   Swelling of ankles        Medication List        Accurate as of June 20, 2021  9:47 AM. If you have any questions, ask your nurse or doctor.          acetaminophen 500 MG tablet Commonly known as: TYLENOL Take 1,000 mg by mouth every 6 (six) hours as needed for moderate pain or headache.   aspirin 81 MG tablet Take 81 mg by  mouth at bedtime.   B-12 PO Take 1,000 mcg by mouth daily.   cholecalciferol 1000 units tablet Commonly known as: VITAMIN D Take 1,000 Units by mouth at bedtime.   Eliquis 5 MG Tabs tablet Generic drug: apixaban Take 5 mg by mouth 2 (two) times daily.   Entresto 24-26 MG Generic drug: sacubitril-valsartan Take 1 tablet by mouth 2 (two) times daily.   ezetimibe 10 MG tablet Commonly known as: ZETIA Take 1 tablet (10 mg total) by mouth daily.   folic acid 1 MG tablet Commonly known as: FOLVITE TAKE ONE TABLET BY MOUTH DAILY START 5 TO 7 DAYS BEFORE ALIMTA CHEMO AND CONTINUE UNTIL 21 DAYS AFTER COMPLETED   folic acid 387 MCG tablet Commonly known as: FOLVITE Take 400 mcg by mouth daily.   Lumakras 120 MG Tabs Generic drug: sotorasib Take 8 tablets (960 mg) by mouth daily after breakfast.   metoprolol succinate 25 MG 24 hr tablet Commonly known as: TOPROL-XL Take 1 tablet (25 mg total) by mouth at bedtime.   nitroGLYCERIN 0.4 MG SL tablet Commonly known as: NITROSTAT Place 1 tablet (0.4 mg total) under the tongue every 5 (five) minutes as needed for chest pain.   rosuvastatin 40 MG  tablet Commonly known as: CRESTOR Take 1 tablet (40 mg total) by mouth at bedtime.   sertraline 50 MG tablet Commonly known as: ZOLOFT Take 50 mg by mouth in the morning and at bedtime.   spironolactone 25 MG tablet Commonly known as: ALDACTONE Take 1 tablet (25 mg total) by mouth daily.   traMADol 50 MG tablet Commonly known as: ULTRAM Take 1 tablet (50 mg total) by mouth every 6 (six) hours as needed.        Allergies:  Allergies  Allergen Reactions   Amlodipine Swelling    Swelling of ankles    Past Medical History, Surgical history, Social history, and Family History were reviewed and updated.  Review of Systems: Review of Systems  Constitutional: Negative.   HENT: Negative.    Eyes: Negative.   Respiratory: Negative.    Cardiovascular: Negative.    Gastrointestinal: Negative.   Genitourinary: Negative.   Musculoskeletal: Negative.   Skin: Negative.   Neurological: Negative.   Endo/Heme/Allergies: Negative.   Psychiatric/Behavioral: Negative.      Physical Exam:  weight is 199 lb (90.3 kg). His oral temperature is 98.4 F (36.9 C). His blood pressure is 131/74 and his pulse is 62. His respiration is 18 and oxygen saturation is 100%.   Wt Readings from Last 3 Encounters:  06/20/21 199 lb (90.3 kg)  06/03/21 199 lb 9.6 oz (90.5 kg)  05/20/21 198 lb 6.4 oz (90 kg)    Physical Exam Vitals reviewed.  HENT:     Head: Normocephalic and atraumatic.  Eyes:     Pupils: Pupils are equal, round, and reactive to light.  Cardiovascular:     Rate and Rhythm: Normal rate and regular rhythm.     Heart sounds: Normal heart sounds.  Pulmonary:     Effort: Pulmonary effort is normal.     Breath sounds: Normal breath sounds.  Abdominal:     General: Bowel sounds are normal.     Palpations: Abdomen is soft.  Musculoskeletal:        General: No tenderness or deformity. Normal range of motion.     Cervical back: Normal range of motion.  Lymphadenopathy:     Cervical: No cervical adenopathy.  Skin:    General: Skin is warm and dry.     Findings: No erythema or rash.  Neurological:     Mental Status: He is alert and oriented to person, place, and time.  Psychiatric:        Behavior: Behavior normal.        Thought Content: Thought content normal.        Judgment: Judgment normal.    Lab Results  Component Value Date   WBC 4.7 06/20/2021   HGB 12.3 (L) 06/20/2021   HCT 39.1 06/20/2021   MCV 101.8 (H) 06/20/2021   PLT 169 06/20/2021   Lab Results  Component Value Date   FERRITIN 758 (H) 10/25/2020   IRON 69 10/25/2020   TIBC 321 10/25/2020   UIBC 252 10/25/2020   IRONPCTSAT 22 10/25/2020   Lab Results  Component Value Date   RBC 3.84 (L) 06/20/2021   No results found for: KPAFRELGTCHN, LAMBDASER, KAPLAMBRATIO No  results found for: IGGSERUM, IGA, IGMSERUM No results found for: John Hinton, John Hinton, John Hinton, John Hinton, John Hinton, John Hinton   Chemistry      Component Value Date/Time   NA 141 06/20/2021 0830   NA 142 06/19/2021 1032   K 4.4 06/20/2021 0830   CL 107 06/20/2021 0830  CO2 27 06/20/2021 0830   BUN 14 06/20/2021 0830   BUN 12 06/19/2021 1032   CREATININE 0.97 06/20/2021 0830   CREATININE 1.01 03/26/2016 1009      Component Value Date/Time   CALCIUM 9.7 06/20/2021 0830   ALKPHOS 109 06/20/2021 0830   AST 27 06/20/2021 0830   ALT 24 06/20/2021 0830   BILITOT 0.3 06/20/2021 0830       Impression and Plan: Mr. Rigor is a very pleasant 75 yo caucasian male with an atypical stage IV lung cancer which was previously resected followed by systemic chemotherapy and maintenance pembrolizumab.   Unfortunate, he had his recurs systemically.  He now is on Lumakras.  We will plan for a follow-up PET scan on him in about 3 weeks.  Hopefully, we will see that he is responding.  So far, we have had good success with Lumakras with responses.  The only problem is been responses have not been as long lasting as I would have liked.  At least, his quality of life is doing so well.  I am so happy that he had a wonderful time in [place].  I will plan to get him back to see [place] in 1 month.   [name], MD 12/2/20229:47 AM

## 2021-06-27 ENCOUNTER — Other Ambulatory Visit: Payer: Self-pay | Admitting: *Deleted

## 2021-06-27 ENCOUNTER — Encounter: Payer: Self-pay | Admitting: *Deleted

## 2021-06-27 MED ORDER — DIPHENOXYLATE-ATROPINE 2.5-0.025 MG PO TABS: ORAL_TABLET | ORAL | 0 refills | Status: DC

## 2021-06-27 NOTE — Progress Notes (Signed)
Oncology Nurse Navigator Documentation  Oncology Nurse Navigator Flowsheets 06/27/2021  Abnormal Finding Date -  Confirmed Diagnosis Date -  Diagnosis Status -  Planned Course of Treatment -  Phase of Treatment -  Chemotherapy Actual Start Date: -  Chemotherapy Actual End Date: -  Surgery Actual Start Date: -  Navigator Follow Up Date: 07/08/2021  Navigator Follow Up Reason: Scan Review  Navigator Location [facility]  Referral Date to RadOnc/MedOnc -  Navigator Encounter Type MyChart  Telephone -  Treatment Initiated Date -  Patient Visit Type MedOnc  Treatment Phase Active Tx  Barriers/Navigation Needs Coordination of Care;Education  Education Pain/ Symptom Management  Interventions Education;Psycho-Social Support;Medication Assistance  Acuity Level 2-Minimal Needs (1-2 Barriers Identified)  Coordination of Care -  Education Method Written  Support Groups/Services Friends and Family  Time Spent with Patient 15

## 2021-07-04 ENCOUNTER — Ambulatory Visit (INDEPENDENT_AMBULATORY_CARE_PROVIDER_SITE_OTHER): Payer: Medicare Other | Admitting: Thoracic Surgery (Cardiothoracic Vascular Surgery)

## 2021-07-04 ENCOUNTER — Other Ambulatory Visit: Payer: Self-pay

## 2021-07-04 ENCOUNTER — Ambulatory Visit
Admission: RE | Admit: 2021-07-04 | Discharge: 2021-07-04 | Disposition: A | Discharge status: Home / Self Care | Payer: Medicare Other | Source: Ambulatory Visit | Attending: Thoracic Surgery (Cardiothoracic Vascular Surgery) | Admitting: Thoracic Surgery (Cardiothoracic Vascular Surgery)

## 2021-07-04 VITALS — Ht 71.0 in | Wt 199.0 lb

## 2021-07-04 DIAGNOSIS — Z902 Acquired absence of lung [part of]: Secondary | ICD-10-CM

## 2021-07-04 DIAGNOSIS — J90 Pleural effusion, not elsewhere classified: Secondary | ICD-10-CM | POA: Diagnosis not present

## 2021-07-04 DIAGNOSIS — C3492 Malignant neoplasm of unspecified part of left bronchus or lung: Secondary | ICD-10-CM | POA: Diagnosis not present

## 2021-07-04 DIAGNOSIS — J9811 Atelectasis: Secondary | ICD-10-CM | POA: Diagnosis not present

## 2021-07-04 DIAGNOSIS — C3490 Malignant neoplasm of unspecified part of unspecified bronchus or lung: Secondary | ICD-10-CM

## 2021-07-04 DIAGNOSIS — R918 Other nonspecific abnormal finding of lung field: Secondary | ICD-10-CM | POA: Diagnosis not present

## 2021-07-04 DIAGNOSIS — R911 Solitary pulmonary nodule: Secondary | ICD-10-CM | POA: Diagnosis not present

## 2021-07-04 DIAGNOSIS — I700 Atherosclerosis of aorta: Secondary | ICD-10-CM | POA: Diagnosis not present

## 2021-07-04 IMAGING — CT CT CHEST W/O CM
2 of 5 series · 15 of 36 positions shown, 18 images · non-contrast
Comparison: PET-CT, [DATE], CT chest angiogram, [DATE]

CLINICAL DATA: Small-cell lung cancer, status post left upper
lobectomy and chemotherapy

EXAM:
CT CHEST WITHOUT CONTRAST
TECHNIQUE: Multidetector CT imaging of the chest was performed following the
standard protocol without IV contrast.

[Series 4: chest 2.00 br40 s3 · coronal · 0.69mm/px · 3 of 151 slices shown]
[im 31/151  lung]
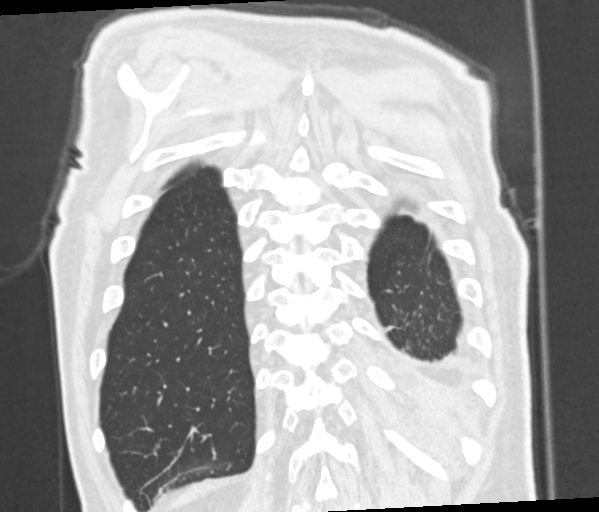
[im 61/151  lung]
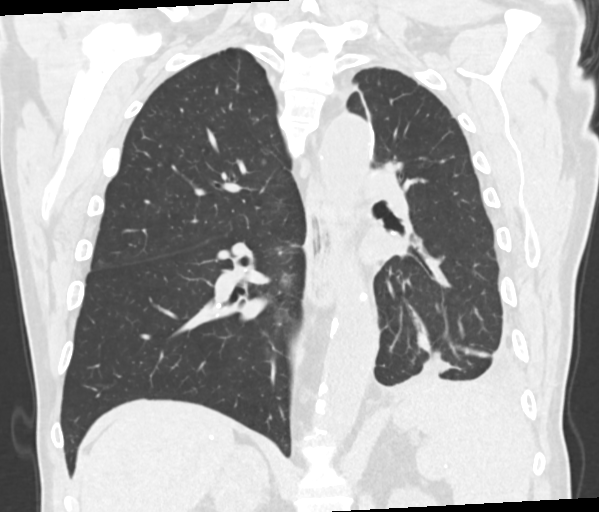
[im 91/151  lung]
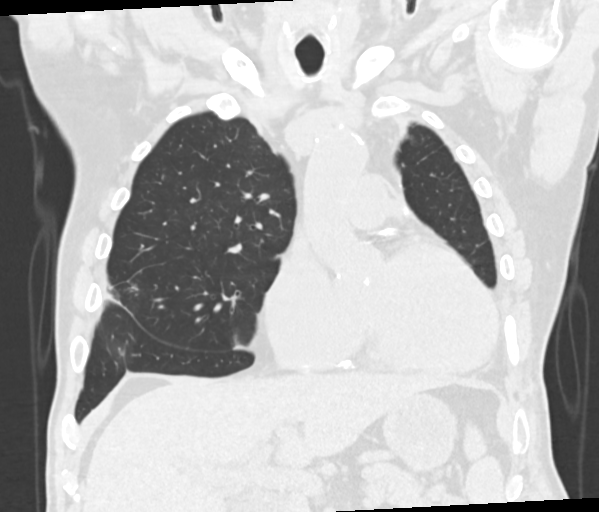

[Series 10: chest 1.00 br40 s3 super d · axial · 0.81mm/px · z∈[+1507,+1813]mm · 12 of 443 slices shown, 15 images]
[im 30/443  mediastinal]
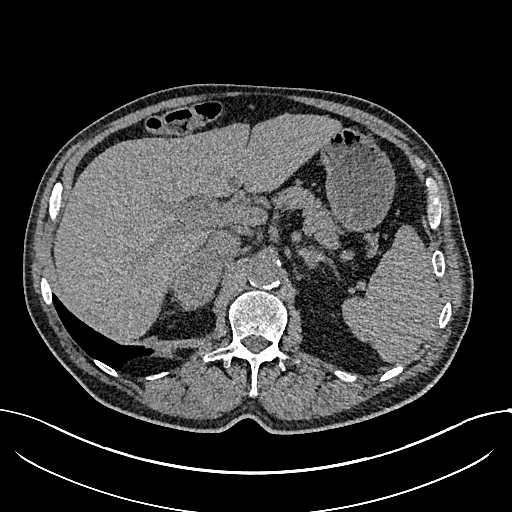
[im 30/443  lung]
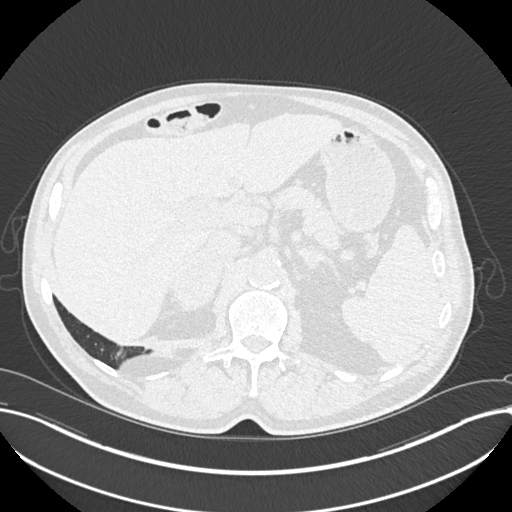
[im 59/443  lung]
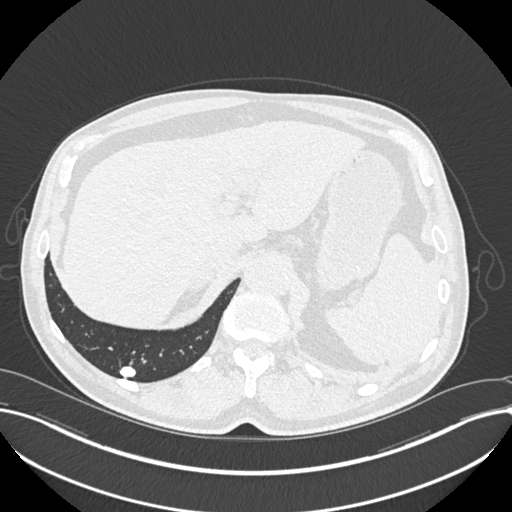
[im 89/443  lung]
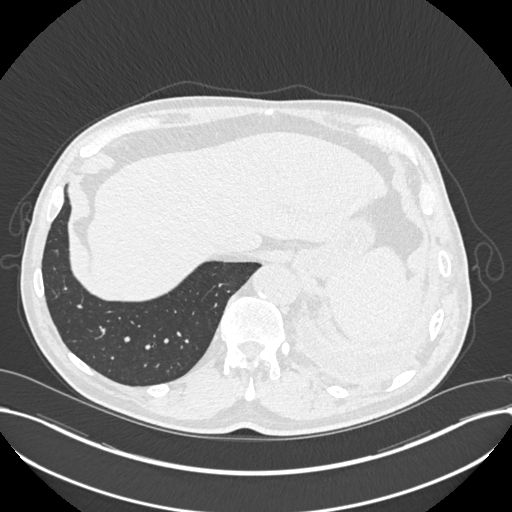
[im 148/443  lung]
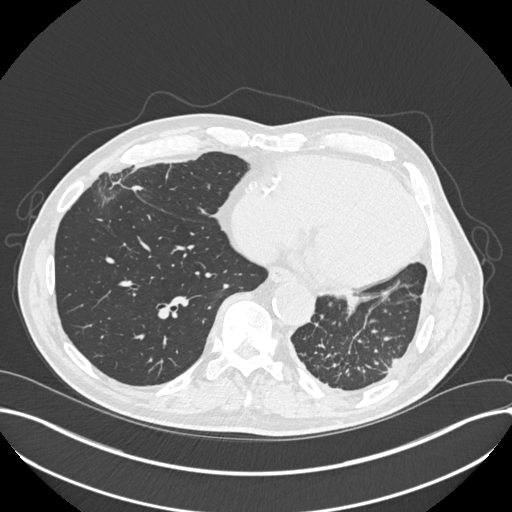
[im 177/443  mediastinal]
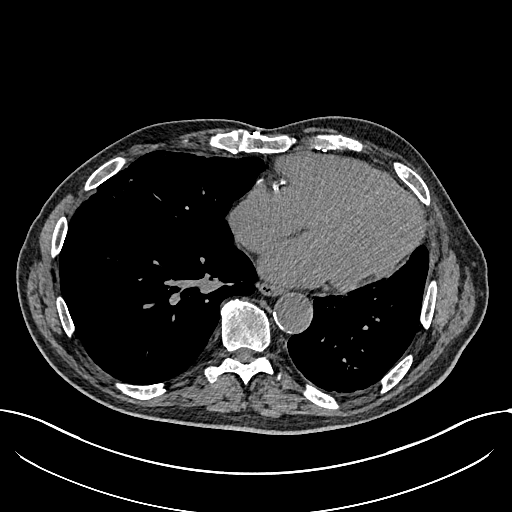
[im 177/443  lung]
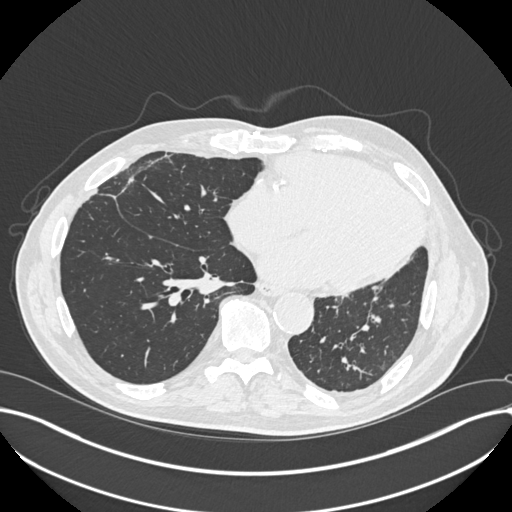
[im 207/443  lung]
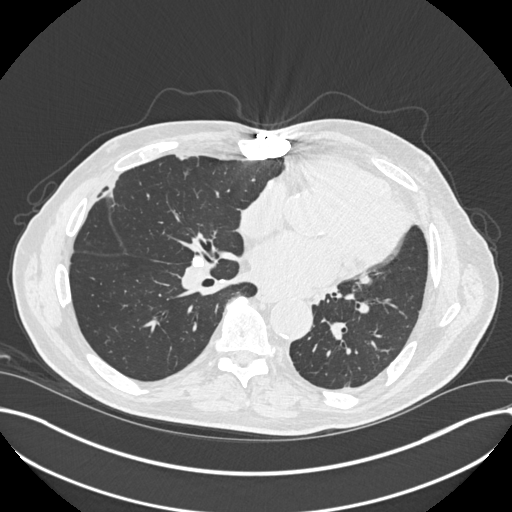
[im 236/443  lung]
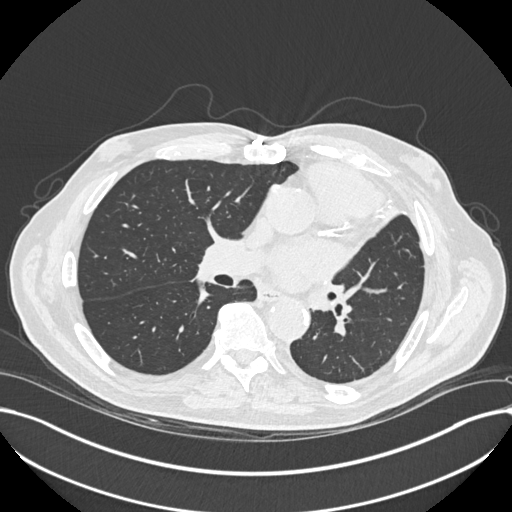
[im 266/443  lung]
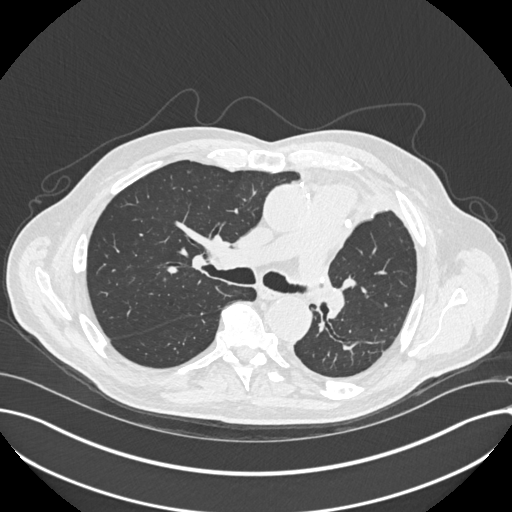
[im 295/443  mediastinal]
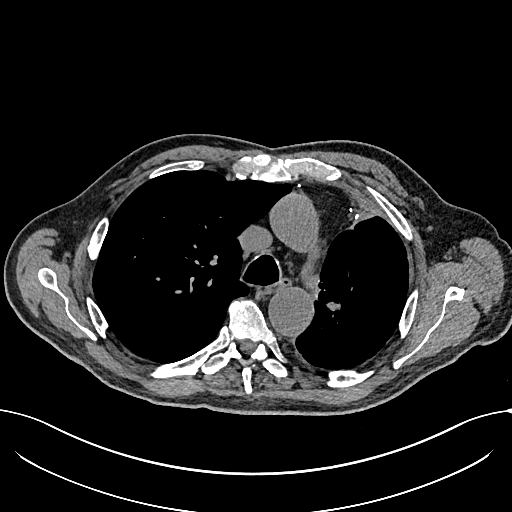
[im 295/443  lung]
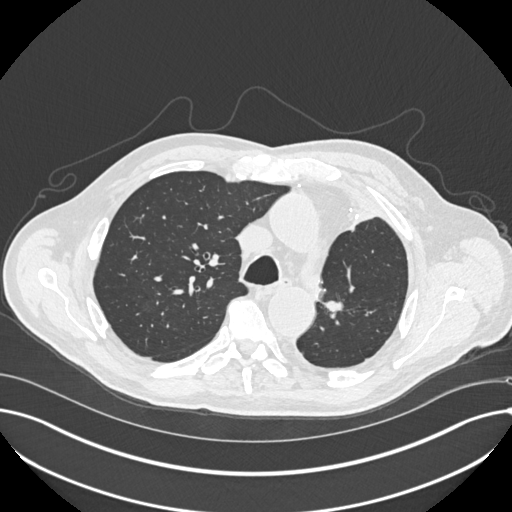
[im 354/443  lung]
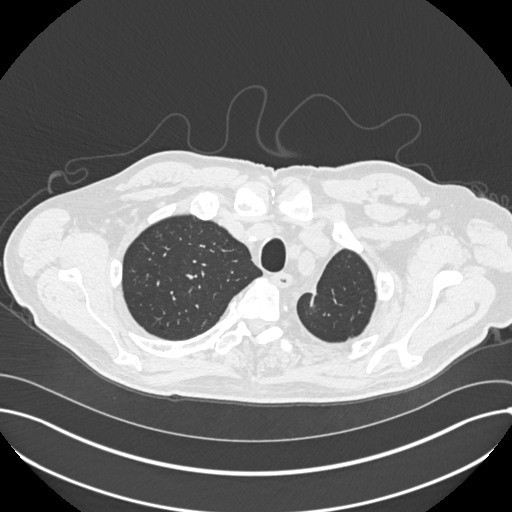
[im 384/443  lung]
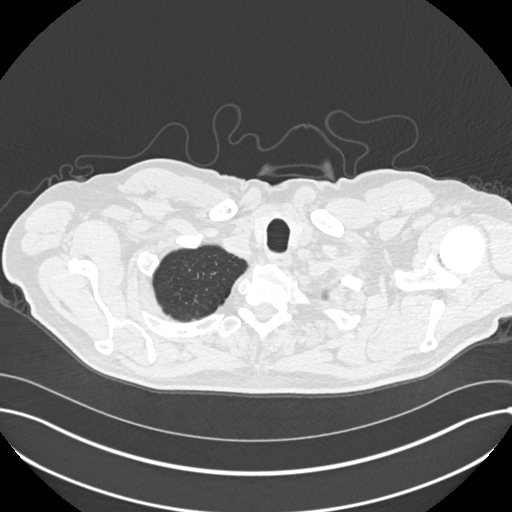
[im 413/443  lung]
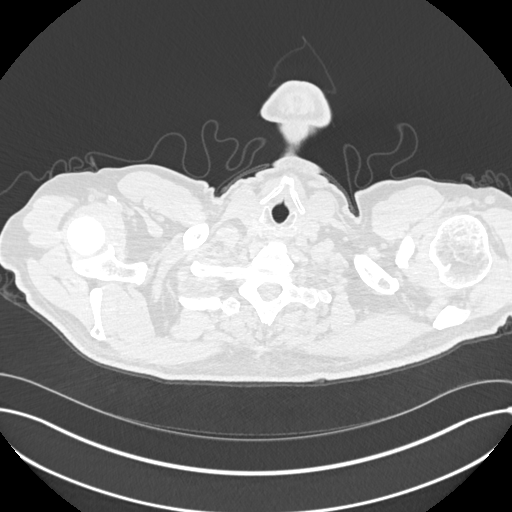

[15 of 36 positions shown; findings below may reference images not displayed]

FINDINGS: Cardiovascular: Aortic atherosclerosis. Cardiomegaly status post
median sternotomy and CABG. Extensive 3 vessel coronary artery
calcifications and stents. No pericardial effusion.

Mediastinum/Nodes: No enlarged mediastinal, hilar, or axillary lymph
nodes. Thyroid gland, trachea, and esophagus demonstrate no
significant findings.

Lungs/Pleura: Status post left upper lobectomy. Essentially complete
interval resolution of multiple new small PET avid pulmonary nodules
seen on prior examination, a tiny residua of a nodule in the right
lower lobe measuring no greater than 0.2 cm (series 8, image 139).
Bandlike sequelae of prior right middle lobe infarction (series 8,
image 104). Small, chronic, loculated left pleural effusion with
associated pleural thickening and round atelectasis of the dependent
left lung base (series 2, image 123, 135).

Upper Abdomen: No acute abnormality. Interval reduction in size of a
partially imaged right adrenal mass measuring 7.6 x 4.2 cm,
previously 8.8 x 4.9 cm (series 2, image 173).

Musculoskeletal: No chest wall mass. Interval sclerosis of multiple
osseous metastatic lesions, including of T1 (series 6, image 106)
and T10 (series 6, image 102).
IMPRESSION: 1. Status post left upper lobectomy.
2. Essentially complete interval resolution of multiple new small
PET avid pulmonary nodules seen on prior examination.
3. Interval reduction in size of a partially imaged right adrenal
mass.
4. Interval sclerosis of osseous metastatic lesions.
5. Constellation of findings is consistent with treatment response.
6. Unchanged small, chronic, loculated left pleural effusion with
associated pleural thickening and round atelectasis of the dependent
left lung base.
7. Bandlike sequelae of prior right middle lobe infarction, seen
acutely on prior examination dated [DATE].
[DATE]. Coronary artery disease.

Aortic Atherosclerosis ([[place]]-[UK]).

## 2021-07-07 NOTE — Progress Notes (Signed)
° °   °  [address]       [place] [address]             [phone]        John Hinton Medical Record #[id number] Date of Birth: Jun 02, 1946  Referring: John Nash, DO Primary Care: John Orn, MD Primary Cardiologist:John [name], MD  Reason for visit:   follow-up  History of Present Illness:     John Hinton is in for his annual appointment.  He is status post robotic assisted left upper lobectomy for a stage IV adenocarcinoma of the lung.  He has subsequently undergone adjuvant chemotherapy and radiation.  He is currently being managed by the medical oncology team.  He denies any respiratory symptoms.  He has noticed some discoloration over some of his incisions.  Physical Exam: Ht 5\' 11"  (1.803 m)    Wt 199 lb (90.3 kg)    BMI 27.75 kg/m   Alert NAD Incision all healed well.  The 2 anteriormost incisions have some mild discoloration.  The scars have not changed much. Abdomen soft, ND No peripheral edema   Diagnostic Studies & Laboratory data: IMPRESSION: 1. Status post left upper lobectomy. 2. Essentially complete interval resolution of multiple new small PET avid pulmonary nodules seen on prior examination. 3. Interval reduction in size of a partially imaged right adrenal mass. 4. Interval sclerosis of osseous metastatic lesions. 5. Constellation of findings is consistent with treatment response. 6. Unchanged small, chronic, loculated left pleural effusion with associated pleural thickening and round atelectasis of the dependent left lung base. 7. Bandlike sequelae of prior right middle lobe infarction, seen acutely on prior examination dated 05/04/2021. 8. Coronary artery disease.      Assessment / Plan:   75 year old male with stage IV lung cancer.  Currently receiving adjuvant therapy.  We will follow-up with [place] as needed.  The rest of his care will be managed by the medical oncology service.   John Hinton 07/07/2021 12:52 PM

## 2021-07-08 ENCOUNTER — Other Ambulatory Visit: Payer: Self-pay

## 2021-07-08 ENCOUNTER — Ambulatory Visit (HOSPITAL_COMMUNITY)
Admission: RE | Admit: 2021-07-08 | Discharge: 2021-07-08 | Disposition: A | Discharge status: Home / Self Care | Payer: Medicare Other | Source: Ambulatory Visit | Attending: Hematology & Oncology | Admitting: Hematology & Oncology

## 2021-07-08 DIAGNOSIS — C3492 Malignant neoplasm of unspecified part of left bronchus or lung: Secondary | ICD-10-CM | POA: Diagnosis not present

## 2021-07-08 DIAGNOSIS — C3490 Malignant neoplasm of unspecified part of unspecified bronchus or lung: Secondary | ICD-10-CM | POA: Diagnosis not present

## 2021-07-08 DIAGNOSIS — C7951 Secondary malignant neoplasm of bone: Secondary | ICD-10-CM | POA: Insufficient documentation

## 2021-07-08 DIAGNOSIS — I2510 Atherosclerotic heart disease of native coronary artery without angina pectoris: Secondary | ICD-10-CM | POA: Diagnosis not present

## 2021-07-08 DIAGNOSIS — C7971 Secondary malignant neoplasm of right adrenal gland: Secondary | ICD-10-CM | POA: Diagnosis not present

## 2021-07-08 DIAGNOSIS — K5790 Diverticulosis of intestine, part unspecified, without perforation or abscess without bleeding: Secondary | ICD-10-CM | POA: Diagnosis not present

## 2021-07-08 LAB — GLUCOSE, CAPILLARY: Glucose-Capillary: 112 mg/dL — ABNORMAL HIGH (ref 70–99)

## 2021-07-08 IMAGING — CT NM PET TUM IMG RESTAG (PS) SKULL BASE T - THIGH
8 series · 18 of 25 positions shown · non-contrast
Comparison: [DATE].

CLINICAL DATA: Subsequent treatment strategy for non-small cell
lung cancer in a 75-year-old male.

EXAM:
NUCLEAR MEDICINE PET SKULL BASE TO THIGH
TECHNIQUE: 9.9 mCi F-18 FDG was injected intravenously. Full-ring PET imaging
was performed from the skull base to thigh after the radiotracer. CT
data was obtained and used for attenuation correction and anatomic
localization.
Fasting blood glucose: 112 mg/dl

[Series 3: pet sk_thigh ac · axial · 5.0mm · 4.07mm/px · z∈[-1454,-542]mm · 4 of 229 slices shown]
[im 1/229]
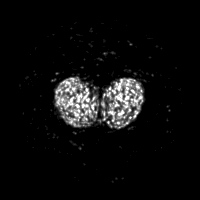
[im 115/229]
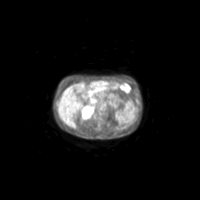
[im 172/229]
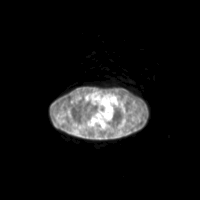
[im 229/229]
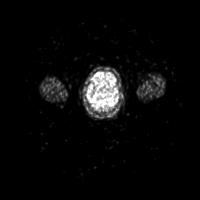

[Series 4: ct sk_thigh 5.0 bf37 · axial · 5.0mm · 0.98mm/px · z∈[-1226,-542]mm · 3 of 229 slices shown]
[im 58/229]
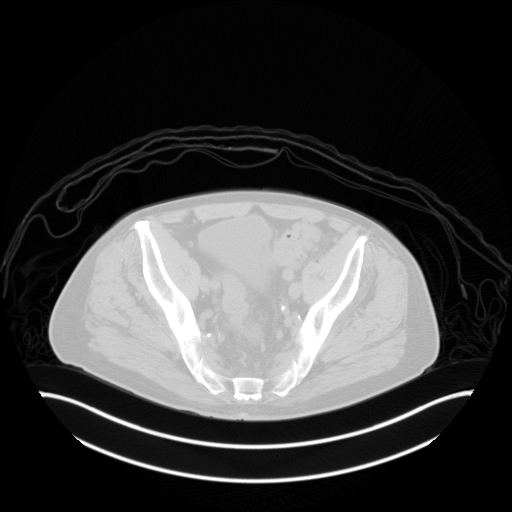
[im 115/229]
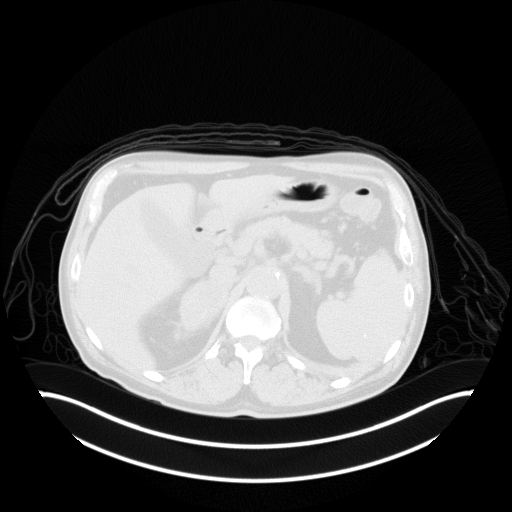
[im 229/229  brain]
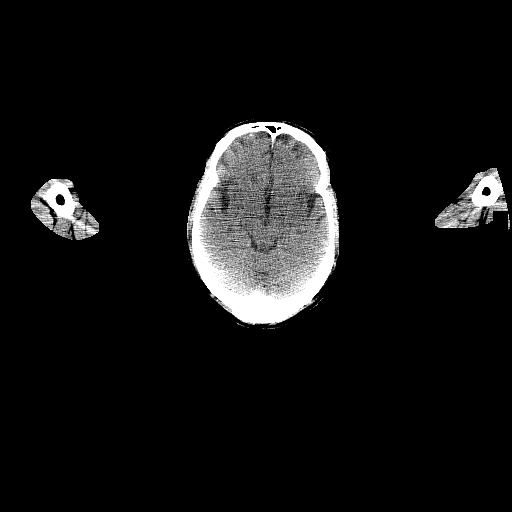

[Series 5: pet sk_thigh nac · axial · 5.0mm · 4.07mm/px · z∈[-1454,-542]mm · 4 of 229 slices shown]
[im 1/229]
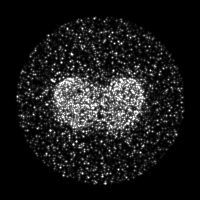
[im 58/229]
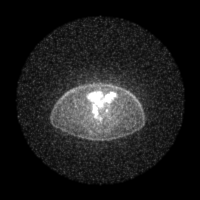
[im 172/229]
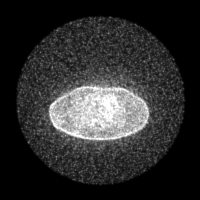
[im 229/229]
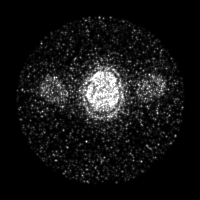

[Series 8: ct sk_thigh 5.0 br59 lung_bone · axial · 5.0mm · 0.73mm/px · 1 of 64 slices shown]
[im 1/64]
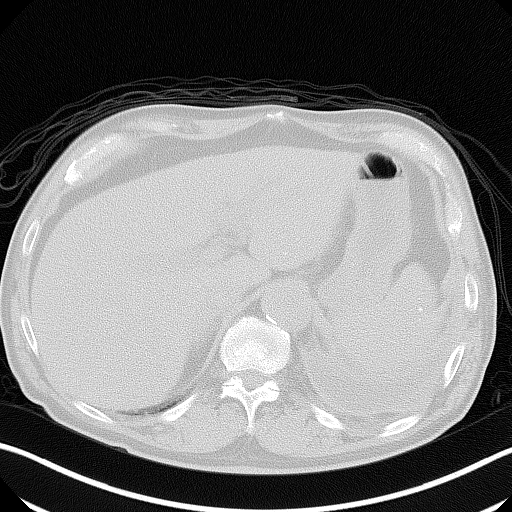

[Series 603: fused cor · 1 of 35 slices shown]
[im 1/35]
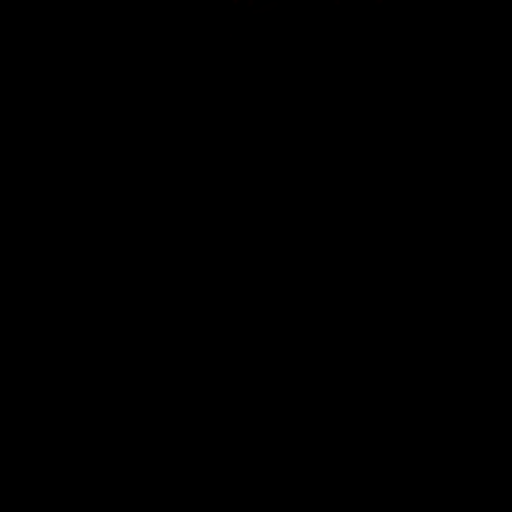

[Series 604: <mip collection> · coronal · 1.89mm/px · 1 of 32 slices shown]
[im 1/32]
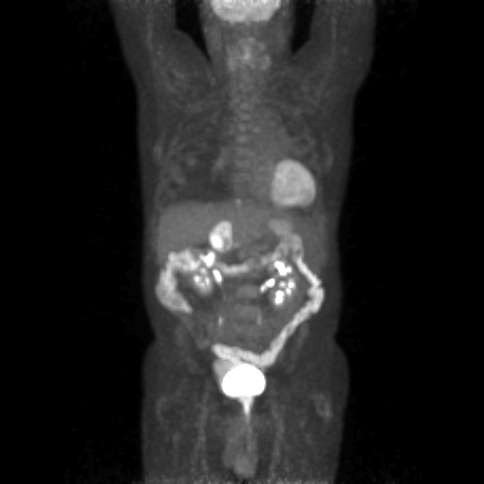

[Series 605: range-ct sk_thigh 5.0 bf37-tra-<alpha range> · 3 of 222 slices shown]
[im 56/222]
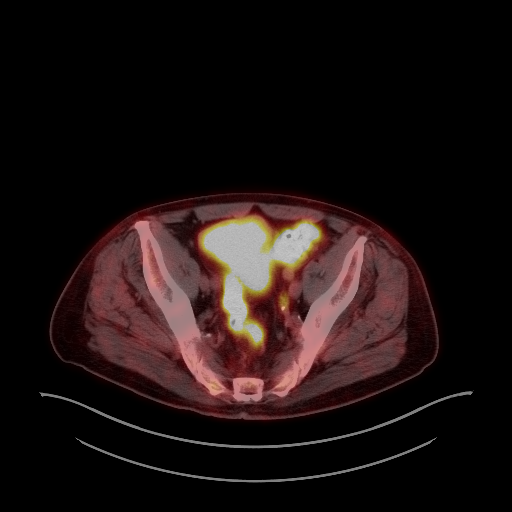
[im 111/222]
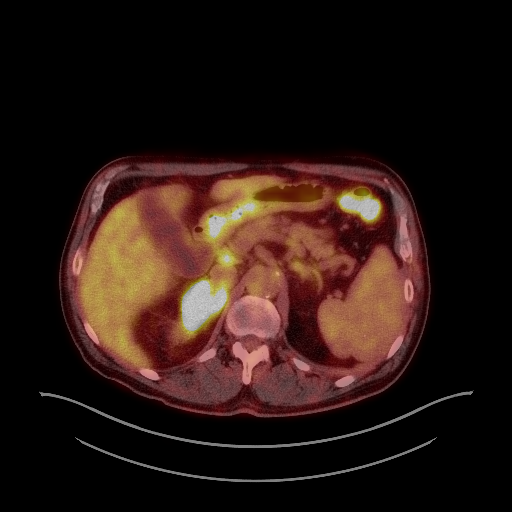
[im 166/222]
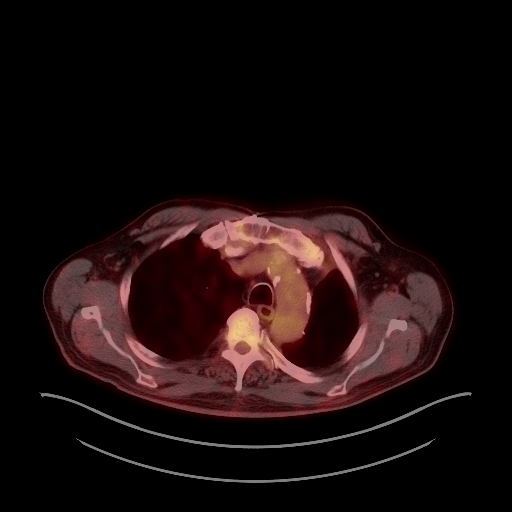

[Series 2008: results mm oncology reading · 1.0mm · 0.50mm/px · 1 of 9 slices shown]
[im 1/9]
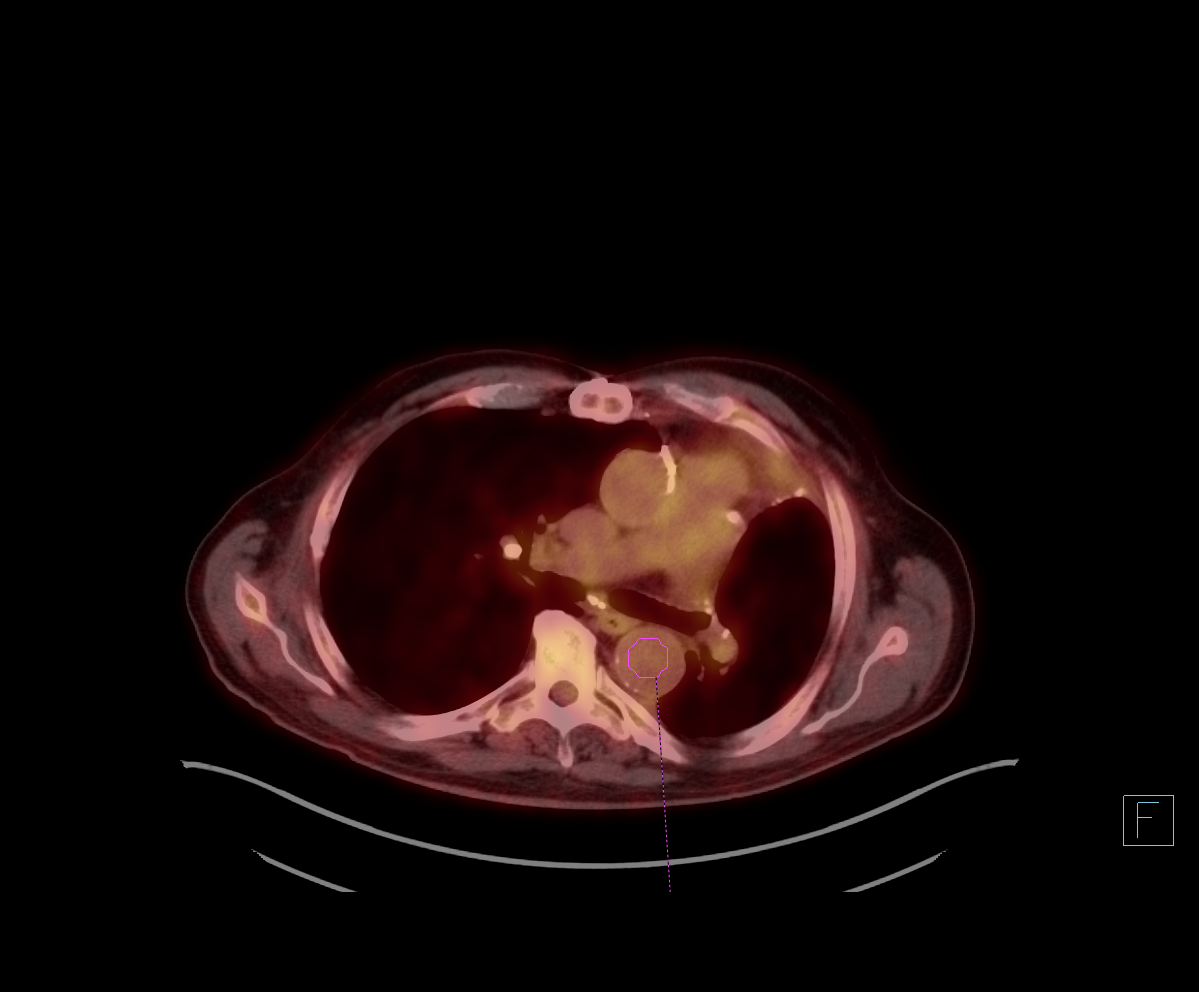

[18 of 25 positions shown; findings below may reference images not displayed]

FINDINGS: Mediastinal blood pool activity: SUV max

Liver activity: SUV max NA

NECK: No hypermetabolic lymph nodes in the neck.

Incidental CT findings: none

CHEST: No hypermetabolic areas in the chest including nodal disease
to indicate disease worsening with interval resolution of activity
associated with small pulmonary nodule seen on the previous study.
These nodules are also no longer present on CT imaging aside from
the area of nodularity along the pleural surface in the LEFT lung
base measuring approximately 2 cm greatest axial dimension
previously 2.6 cm maximum SUV today 1.94 as compared to

Incidental CT findings: Calcified atheromatous plaque of the
thoracic aorta. No aneurysmal dilation. Three-vessel coronary artery
disease. Normal heart size. Changes of partial lung resection in the
LEFT chest as before. Pleural and parenchymal scarring throughout
the chest. Mild pleural thickening and small LEFT-sided pleural
effusion otherwise stable in the background.

ABDOMEN/PELVIS: RIGHT adrenal metastatic lesion (image 114/4) 5.3 x
3.9 cm previously 8.8 x 4.9 cm. Maximum SUV today of 23.6 as
compared to 20.94.

LEFT adrenal nodule approximately 12 mm greatest thickness on
today's study. Previously 17 mm (image 118/4) maximum SUV in this
location 3.5 as compared to 22.4.

Mild to moderate activity within a periportal/portacaval lymph node
(image 115/4) maximum SUV of 5.2, previously less than blood pool.
No additional new foci of increased metabolic activity in the
abdomen or pelvis.

Incidental CT findings: No acute findings relative to liver, spleen,
pancreas, kidneys, stomach or small bowel.

Colonic diverticular changes. Question mild pericolonic stranding.
There is diffuse colonic uptake on the current study which is
nonspecific.

Signs of aortic atherosclerosis without aneurysmal dilation. No
adenopathy by size criteria in the abdomen or pelvis.

Reproductive structures grossly unremarkable.

SKELETON: Index lesion at T10 with a maximum SUV of 5.4 as compared
with 10.9 in the RIGHT lateral aspect of T10 (image 97/4) this is
mainly sclerotic on the current exam previously a mixed lytic and
sclerotic changes and measures approximately 3 cm greatest axial
dimension which is similar to the prior study. RIGHT lateral aspect
of T1 now with dense sclerosis and without substantial metabolic
activity, very mild increased metabolic activity in this location is
visibly less than on the previous exam [DATE] as compared to
maximum SUV.

Resolution of scapular activity seen in the RIGHT scapula on the
previous study. RIGHT fourth rib with irregularity and sclerotic
changes without signs of uptake beyond mediastinal blood pool
markedly improved.

LEFT-sided seventh rib with resolution of metabolic activity as well
since the previous exam.

LEFT iliac bone with focal lesion showing further peripheral
sclerotic changes previously lucent with a maximum SUV of 4.9 as
compared to 8.9 on the prior exam.

Mild increase in generalized marrow uptake in the spine. This is in
general mildly heterogeneous.

Incidental CT findings: none
IMPRESSION: On balance, interval response to therapy with increasingly sclerotic
appearance of bony metastases showing diminished or resolved FDG
uptake compared to previous imaging, resolution of small pulmonary
nodules that previously showed mildly increased metabolic activity
in the RIGHT chest with decreased size of bilateral adrenal
metastases compared to previous imaging.

Despite increased size of the RIGHT adrenal metastasis there is
increased metabolic activity currently in the RIGHT adrenal and
there is an adjacent portacaval lymph node that shows mild to
moderate increased FDG uptake, attention on follow-up.

Decreasing size of presumed rounded atelectasis at the LEFT lung
base associated with chronic appearing LEFT effusion.

Diffuse metabolic activity throughout the colon. Nonspecific but
could be seen in the setting of colitis. Correlate with any clinical
signs of colitis. More pronounced in the sigmoid where there is
diverticular change but the activity itself is more diffuse seen
throughout the entire colon.

Generalized increased metabolic activity throughout the marrow
spaces particularly in the spine potentially related to marrow
stimulation, given mildly heterogeneous appearance would suggest
close attention on follow-up.

Aortic Atherosclerosis ([NR]-[NR]).

These results will be called to the ordering clinician or
representative by the Radiologist Assistant, and communication
documented in the PACS or [REDACTED].

## 2021-07-08 MED ORDER — FLUDEOXYGLUCOSE F - 18 (FDG) INJECTION
8.9000 | Freq: Once | INTRAVENOUS | Status: AC
  Administered 2021-07-08: 09:00:00 9.9 via INTRAVENOUS

## 2021-07-09 ENCOUNTER — Encounter: Payer: Self-pay | Admitting: *Deleted

## 2021-07-09 ENCOUNTER — Telehealth: Payer: Self-pay

## 2021-07-09 NOTE — Progress Notes (Signed)
Oncology Nurse Navigator Documentation  Oncology Nurse Navigator Flowsheets 07/09/2021  Abnormal Finding Date -  Confirmed Diagnosis Date -  Diagnosis Status -  Planned Course of Treatment -  Phase of Treatment -  Chemotherapy Actual Start Date: -  Chemotherapy Actual End Date: -  Surgery Actual Start Date: -  Navigator Follow Up Date: 07/17/2021  Navigator Follow Up Reason: Follow-up Appointment  Navigator Location [facility]  Referral Date to RadOnc/MedOnc -  Navigator Encounter Type Scan Review  Telephone -  Treatment Initiated Date -  Patient Visit Type MedOnc  Treatment Phase Active Tx  Barriers/Navigation Needs Coordination of Care;Education  Education -  Interventions None Required  Acuity Level 2-Minimal Needs (1-2 Barriers Identified)  Coordination of Care -  Education Method -  Support Groups/Services Friends and Family  Time Spent with Patient 15

## 2021-07-09 NOTE — Telephone Encounter (Signed)
Pt advised of PET scan results and stated understanding.

## 2021-07-17 ENCOUNTER — Encounter: Payer: Self-pay | Admitting: Hematology & Oncology

## 2021-07-17 ENCOUNTER — Other Ambulatory Visit: Payer: Self-pay

## 2021-07-17 ENCOUNTER — Encounter: Payer: Self-pay | Admitting: *Deleted

## 2021-07-17 ENCOUNTER — Telehealth: Payer: Self-pay | Admitting: *Deleted

## 2021-07-17 ENCOUNTER — Inpatient Hospital Stay (HOSPITAL_BASED_OUTPATIENT_CLINIC_OR_DEPARTMENT_OTHER): Payer: Medicare Other | Admitting: Hematology & Oncology

## 2021-07-17 ENCOUNTER — Telehealth: Payer: Self-pay | Admitting: Hematology & Oncology

## 2021-07-17 ENCOUNTER — Inpatient Hospital Stay: Payer: Medicare Other

## 2021-07-17 VITALS — BP 114/67 | HR 57 | Temp 98.5°F | Resp 20 | Wt 194.1 lb

## 2021-07-17 DIAGNOSIS — Z7901 Long term (current) use of anticoagulants: Secondary | ICD-10-CM | POA: Diagnosis not present

## 2021-07-17 DIAGNOSIS — C7971 Secondary malignant neoplasm of right adrenal gland: Secondary | ICD-10-CM | POA: Diagnosis not present

## 2021-07-17 DIAGNOSIS — C7972 Secondary malignant neoplasm of left adrenal gland: Secondary | ICD-10-CM | POA: Diagnosis not present

## 2021-07-17 DIAGNOSIS — C3492 Malignant neoplasm of unspecified part of left bronchus or lung: Secondary | ICD-10-CM

## 2021-07-17 DIAGNOSIS — C3412 Malignant neoplasm of upper lobe, left bronchus or lung: Secondary | ICD-10-CM | POA: Diagnosis not present

## 2021-07-17 DIAGNOSIS — I2699 Other pulmonary embolism without acute cor pulmonale: Secondary | ICD-10-CM | POA: Diagnosis not present

## 2021-07-17 DIAGNOSIS — C7951 Secondary malignant neoplasm of bone: Secondary | ICD-10-CM | POA: Diagnosis not present

## 2021-07-17 LAB — CBC WITH DIFFERENTIAL (CANCER CENTER ONLY)
Abs Immature Granulocytes: 0.01 10*3/uL (ref 0.00–0.07)
Basophils Absolute: 0 10*3/uL (ref 0.0–0.1)
Basophils Relative: 0 %
Eosinophils Absolute: 0.1 10*3/uL (ref 0.0–0.5)
Eosinophils Relative: 1 %
HCT: 38.1 % — ABNORMAL LOW (ref 39.0–52.0)
Hemoglobin: 12.2 g/dL — ABNORMAL LOW (ref 13.0–17.0)
Immature Granulocytes: 0 %
Lymphocytes Relative: 9 %
Lymphs Abs: 0.4 10*3/uL — ABNORMAL LOW (ref 0.7–4.0)
MCH: 32.4 pg (ref 26.0–34.0)
MCHC: 32 g/dL (ref 30.0–36.0)
MCV: 101.3 fL — ABNORMAL HIGH (ref 80.0–100.0)
Monocytes Absolute: 0.4 10*3/uL (ref 0.1–1.0)
Monocytes Relative: 9 %
Neutro Abs: 3.6 10*3/uL (ref 1.7–7.7)
Neutrophils Relative %: 81 %
Platelet Count: 182 10*3/uL (ref 150–400)
RBC: 3.76 MIL/uL — ABNORMAL LOW (ref 4.22–5.81)
RDW: 14.6 % (ref 11.5–15.5)
WBC Count: 4.5 10*3/uL (ref 4.0–10.5)
nRBC: 0 % (ref 0.0–0.2)

## 2021-07-17 LAB — CMP (CANCER CENTER ONLY)
ALT: 354 U/L (ref 0–44)
AST: 292 U/L (ref 15–41)
Albumin: 3.9 g/dL (ref 3.5–5.0)
Alkaline Phosphatase: 819 U/L — ABNORMAL HIGH (ref 38–126)
Anion gap: 7 (ref 5–15)
BUN: 13 mg/dL (ref 8–23)
CO2: 30 mmol/L (ref 22–32)
Calcium: 9.4 mg/dL (ref 8.9–10.3)
Chloride: 106 mmol/L (ref 98–111)
Creatinine: 0.84 mg/dL (ref 0.61–1.24)
GFR, Estimated: >60 mL/min (ref >60)
Glucose, Bld: 110 mg/dL — ABNORMAL HIGH (ref 70–99)
Potassium: 3.4 mmol/L — ABNORMAL LOW (ref 3.5–5.1)
Sodium: 143 mmol/L (ref 135–145)
Total Bilirubin: 1 mg/dL (ref 0.3–1.2)
Total Protein: 6.9 g/dL (ref 6.5–8.1)

## 2021-07-17 LAB — LACTATE DEHYDROGENASE: LDH: 278 U/L — ABNORMAL HIGH (ref 98–192)

## 2021-07-17 NOTE — Telephone Encounter (Signed)
Dr. Marin Olp notified of [id number], [place] and Alk [id number].  No new orders received at this time.

## 2021-07-17 NOTE — Progress Notes (Signed)
Patient in the office today for follow up while on oral treatment with Lumakras. Unfortunately treatment will need to be held for liver and GI toxicity. Dr Marin Olp would also like an US of the liver to rule out other possible etiologies.   Oncology Nurse Navigator Documentation  Oncology Nurse Navigator Flowsheets 07/17/2021  Abnormal Finding Date -  Confirmed Diagnosis Date -  Diagnosis Status -  Planned Course of Treatment -  Phase of Treatment -  Chemotherapy Actual Start Date: -  Chemotherapy Actual End Date: -  Surgery Actual Start Date: -  Navigator Follow Up Date: 07/31/2021  Navigator Follow Up Reason: Follow-up Appointment  Navigator Location [facility]  Referral Date to RadOnc/MedOnc -  Navigator Encounter Type Appt/Treatment Plan Review  Telephone -  Treatment Initiated Date -  Patient Visit Type MedOnc  Treatment Phase Active Tx  Barriers/Navigation Needs Coordination of Care;Education  Education -  Interventions None Required  Acuity Level 2-Minimal Needs (1-2 Barriers Identified)  Coordination of Care -  Education Method -  Support Groups/Services Friends and Family  Time Spent with Patient 30

## 2021-07-17 NOTE — Progress Notes (Signed)
Hematology and Oncology Follow Up Visit  John Hinton [id number] 12-15-1945 75 y.o. 07/17/2021   Principle Diagnosis:  Stage IVA (T3N2N0) adenocarcinoma of the LUL -- resected -- (+) KRAS -- progression - metastasis to right lung, bilateral adrenal glands, bony metastasis to T1, Y 10 and left iliac bone PE involving the right lower lobe and right middle lobe branches, no right heart strain noted  Past Therapy: S/P cycle 6 of Carbo/Alimta/Pembrolizumab Alimta/pembrolizumab-maintenance therapy s/p cycle 3/4  - start on 11/28/2020 --Alimta discontinued after cycle 2   Current Therapy:        Lumakras 960 mg po q day -- started on 05/02/2021 -- hold on 07/17/2022 Xgeva every 3 months - due again 08/2021 Eliquis 5 mg PO BID   Interim History:  John Hinton is here today for follow-up.  Unfortunately, I think he is having problems with the [place].  He is having a lot of diarrhea.  He goes at least 10 times a day.  He is on Lomotil which did is helping a little bit but still diarrhea is a real problem for him.  He is lost 6 pounds since we last saw him here.  His liver function studies are also quite high.  Again I believe all this is from the [facility].  He did have a PET scan done.  The PET scan showed that there was a response.  However, there was a right adrenal lesion that was smaller at 5.3 x 3.9.  However it still had a significant SUV of 23.6.  Again, this is all about quality of life.  We will have to hold the [facility] for right now.  I told him to also stop the Crestor that he is on.    He is not having any pain.  He just does not have much of an appetite.  He has had no bleeding.  Of note, he did have a chest CT scan done on 07/04/2021.  This showed resolution of multiple pulmonary nodules.  Again, I have to believe that the Lumakras is helping.  We just may have to decrease the dose.  I would like to get a ultrasound of his liver to make sure there is nothing going on  with the liver.\  He continues on Eliquis.  He has had a history of pulmonary embolism.  Currently, I would say his performance status is probably ECOG 2.     Medications:  Allergies as of 07/17/2021       Reactions   Amlodipine Swelling   Swelling of ankles        Medication List        Accurate as of July 17, 2021  9:55 AM. If you have any questions, ask your nurse or doctor.          acetaminophen 500 MG tablet Commonly known as: TYLENOL Take 1,000 mg by mouth every 6 (six) hours as needed for moderate pain or headache.   apixaban 5 MG Tabs tablet Commonly known as: ELIQUIS Take 5 mg by mouth 2 (two) times daily.   aspirin 81 MG tablet Take 81 mg by mouth at bedtime.   B-12 PO Take 1,000 mcg by mouth daily.   cholecalciferol 1000 units tablet Commonly known as: VITAMIN D Take 1,000 Units by mouth at bedtime.   diphenoxylate-atropine 2.5-0.025 MG tablet Commonly known as: LOMOTIL Take 2 tablets at onset of diarrhea. Take 1 tablets after each loose stool. Max 8 tablets per day.   Entresto 24-26 MG  Generic drug: sacubitril-valsartan Take 1 tablet by mouth 2 (two) times daily.   ezetimibe 10 MG tablet Commonly known as: ZETIA Take 1 tablet (10 mg total) by mouth daily.   folic acid 1 MG tablet Commonly known as: FOLVITE TAKE ONE TABLET BY MOUTH DAILY START 5 TO 7 DAYS BEFORE ALIMTA CHEMO AND CONTINUE UNTIL 21 DAYS AFTER COMPLETED   Lumakras 120 MG Tabs Generic drug: sotorasib Take 8 tablets (960 mg) by mouth daily after breakfast.   metoprolol succinate 25 MG 24 hr tablet Commonly known as: TOPROL-XL Take 1 tablet (25 mg total) by mouth at bedtime.   nitroGLYCERIN 0.4 MG SL tablet Commonly known as: NITROSTAT Place 1 tablet (0.4 mg total) under the tongue every 5 (five) minutes as needed for chest pain.   rosuvastatin 40 MG tablet Commonly known as: CRESTOR Take 1 tablet (40 mg total) by mouth at bedtime.   sertraline 50 MG  tablet Commonly known as: ZOLOFT Take 50 mg by mouth in the morning and at bedtime.   spironolactone 25 MG tablet Commonly known as: ALDACTONE Take 1 tablet (25 mg total) by mouth daily.   traMADol 50 MG tablet Commonly known as: ULTRAM Take 1 tablet (50 mg total) by mouth every 6 (six) hours as needed.        Allergies:  Allergies  Allergen Reactions   Amlodipine Swelling    Swelling of ankles    Past Medical History, Surgical history, Social history, and Family History were reviewed and updated.  Review of Systems: Review of Systems  Constitutional: Negative.   HENT: Negative.    Eyes: Negative.   Respiratory: Negative.    Cardiovascular: Negative.   Gastrointestinal: Negative.   Genitourinary: Negative.   Musculoskeletal: Negative.   Skin: Negative.   Neurological: Negative.   Endo/Heme/Allergies: Negative.   Psychiatric/Behavioral: Negative.      Physical Exam:  weight is 194 lb 1.3 oz (88 kg). His oral temperature is 98.5 F (36.9 C). His blood pressure is 114/67 and his pulse is 57 (abnormal). His respiration is 20 and oxygen saturation is 100%.   Wt Readings from Last 3 Encounters:  07/17/21 194 lb 1.3 oz (88 kg)  07/04/21 199 lb (90.3 kg)  06/20/21 199 lb (90.3 kg)    Physical Exam Vitals reviewed.  HENT:     Head: Normocephalic and atraumatic.  Eyes:     Pupils: Pupils are equal, round, and reactive to light.  Cardiovascular:     Rate and Rhythm: Normal rate and regular rhythm.     Heart sounds: Normal heart sounds.  Pulmonary:     Effort: Pulmonary effort is normal.     Breath sounds: Normal breath sounds.  Abdominal:     General: Bowel sounds are normal.     Palpations: Abdomen is soft.  Musculoskeletal:        General: No tenderness or deformity. Normal range of motion.     Cervical back: Normal range of motion.  Lymphadenopathy:     Cervical: No cervical adenopathy.  Skin:    General: Skin is warm and dry.     Findings: No erythema  or rash.  Neurological:     Mental Status: He is alert and oriented to person, place, and time.  Psychiatric:        Behavior: Behavior normal.        Thought Content: Thought content normal.        Judgment: Judgment normal.    Lab Results  Component Value  Date   WBC 4.5 07/17/2021   HGB 12.2 (L) 07/17/2021   HCT 38.1 (L) 07/17/2021   MCV 101.3 (H) 07/17/2021   PLT 182 07/17/2021   Lab Results  Component Value Date   FERRITIN 758 (H) 10/25/2020   IRON 69 10/25/2020   TIBC 321 10/25/2020   UIBC 252 10/25/2020   IRONPCTSAT 22 10/25/2020   Lab Results  Component Value Date   RBC 3.76 (L) 07/17/2021   No results found for: KPAFRELGTCHN, LAMBDASER, KAPLAMBRATIO No results found for: IGGSERUM, IGA, IGMSERUM No results found for: Odetta Pink, SPEI   Chemistry      Component Value Date/Time   NA 143 07/17/2021 0840   NA 142 06/19/2021 1032   K 3.4 (L) 07/17/2021 0840   CL 106 07/17/2021 0840   CO2 30 07/17/2021 0840   BUN 13 07/17/2021 0840   BUN 12 06/19/2021 1032   CREATININE 0.84 07/17/2021 0840   CREATININE 1.01 03/26/2016 1009      Component Value Date/Time   CALCIUM 9.4 07/17/2021 0840   ALKPHOS 819 (H) 07/17/2021 0840   AST 292 (HH) 07/17/2021 0840   ALT 354 (HH) 07/17/2021 0840   BILITOT 1.0 07/17/2021 0840       Impression and Plan: Mr. Mcdiarmid is a very pleasant 75 yo caucasian male with an atypical stage IV lung cancer which was previously resected followed by systemic chemotherapy and maintenance pembrolizumab.   Again, we will have to hold the [facility] for right now.  I do not see a problem with doing this.  We really have to improve his quality of life.  I told him he could take 2 Lomotil if necessary after each bout of diarrhea.  I also told he and his wife to try a probiotic.  I would like to get him back in a couple weeks.  We will have to see how he is feeling.  We will try to get the  ultrasound of his abdomen tomorrow.  I know this is quite complicated.  I am just surprised that he has had these problems.   [name], MD 12/29/20229:55 AM

## 2021-07-17 NOTE — Telephone Encounter (Signed)
Scheduled appt per 12/29 los - unable to reach patient . Left message for patient with appt date and time

## 2021-07-18 ENCOUNTER — Ambulatory Visit (HOSPITAL_COMMUNITY)
Admission: RE | Admit: 2021-07-18 | Discharge: 2021-07-18 | Disposition: A | Discharge status: Home / Self Care | Payer: Medicare Other | Source: Ambulatory Visit | Attending: Hematology & Oncology | Admitting: Hematology & Oncology

## 2021-07-18 DIAGNOSIS — K838 Other specified diseases of biliary tract: Secondary | ICD-10-CM | POA: Diagnosis not present

## 2021-07-18 DIAGNOSIS — R945 Abnormal results of liver function studies: Secondary | ICD-10-CM | POA: Diagnosis not present

## 2021-07-18 DIAGNOSIS — C3492 Malignant neoplasm of unspecified part of left bronchus or lung: Secondary | ICD-10-CM | POA: Insufficient documentation

## 2021-07-18 IMAGING — US US ABDOMEN LIMITED
1 series · 14 of 25 positions shown · non-contrast
Comparison: [DATE].

CLINICAL DATA: Elevated liver function tests.

EXAM:
ULTRASOUND ABDOMEN LIMITED RIGHT UPPER QUADRANT

[Series 1: us abdomen limited · 60 acquisitions, 14 frames shown]
[im 1/60]
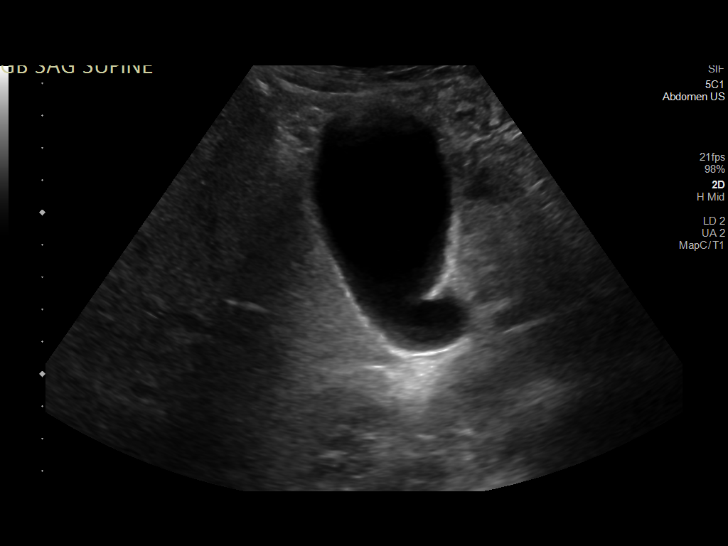
[im 5/60]
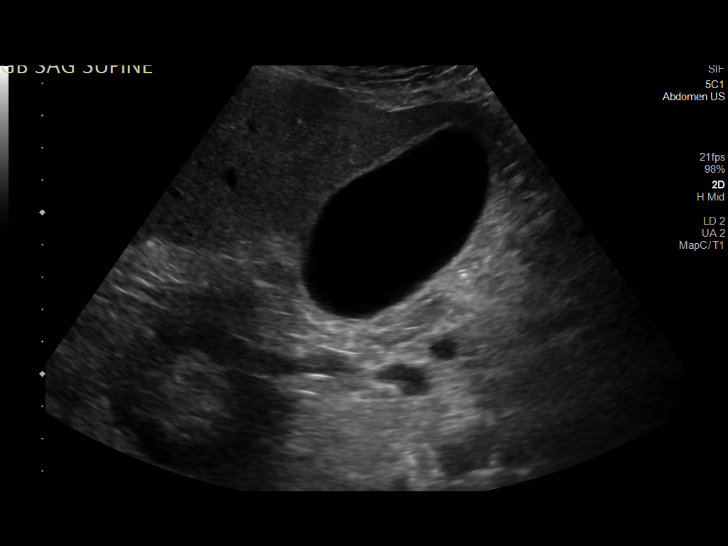
[im 10/60]
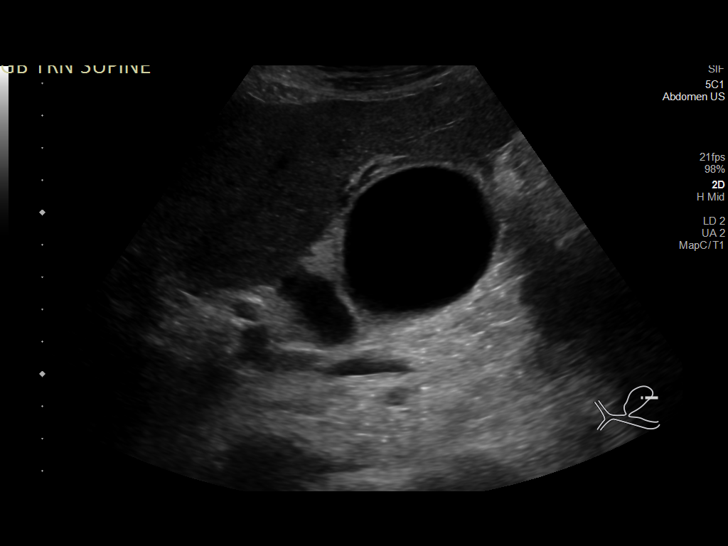
[im 15/60]
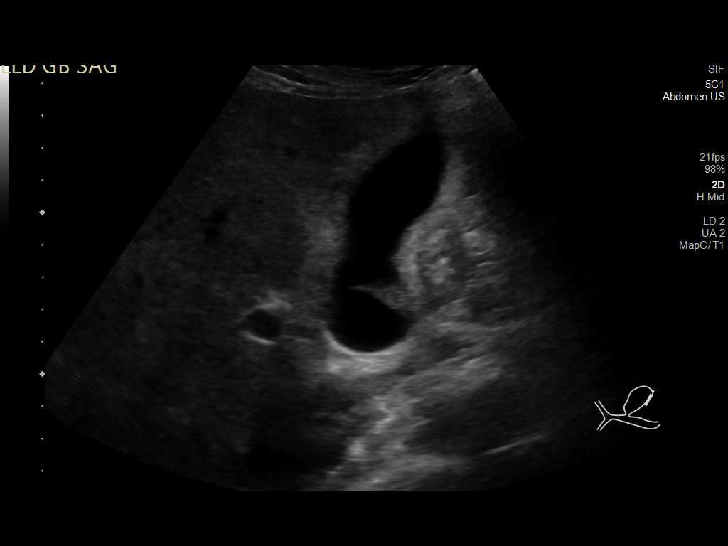
[im 20/60]
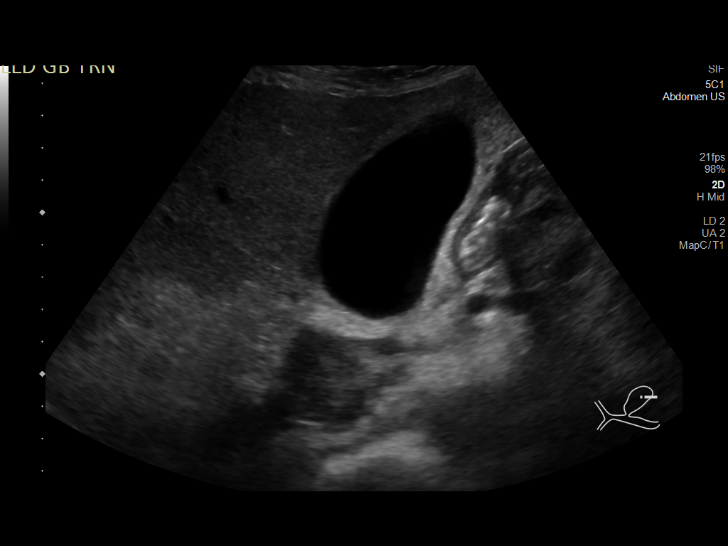
[im 23/60]
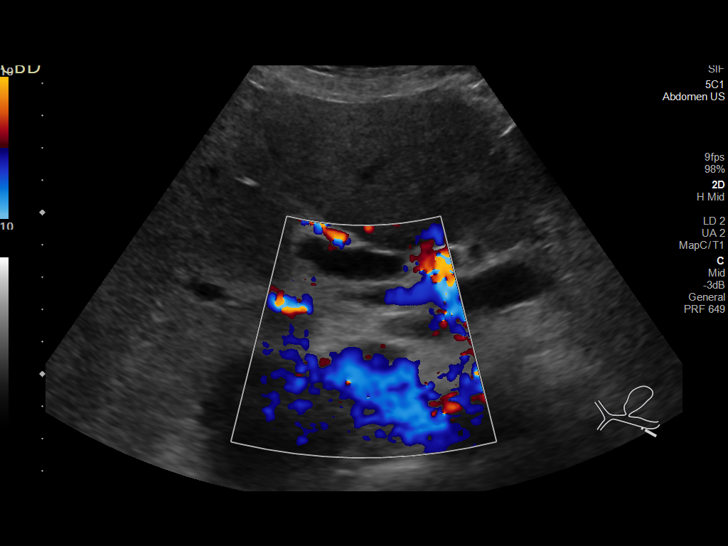
[im 28/60]
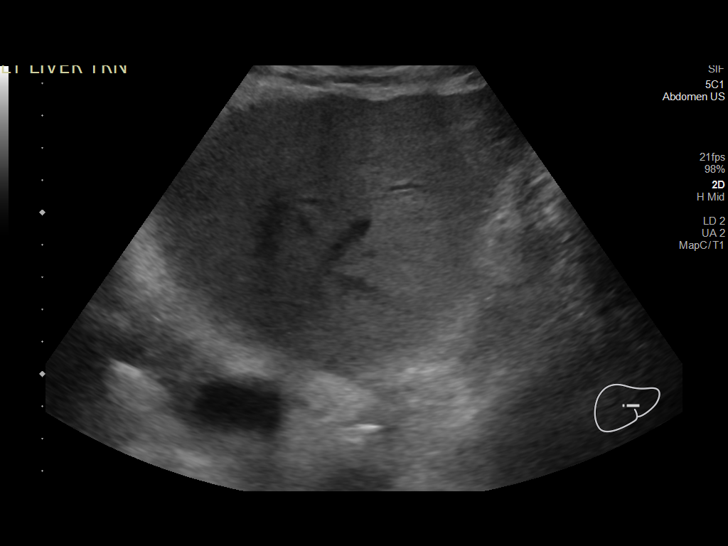
[im 32/60]
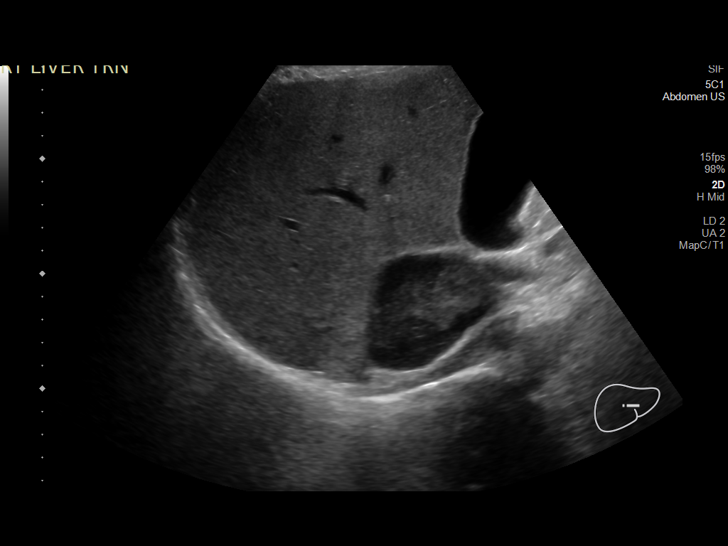
[im 37/60]
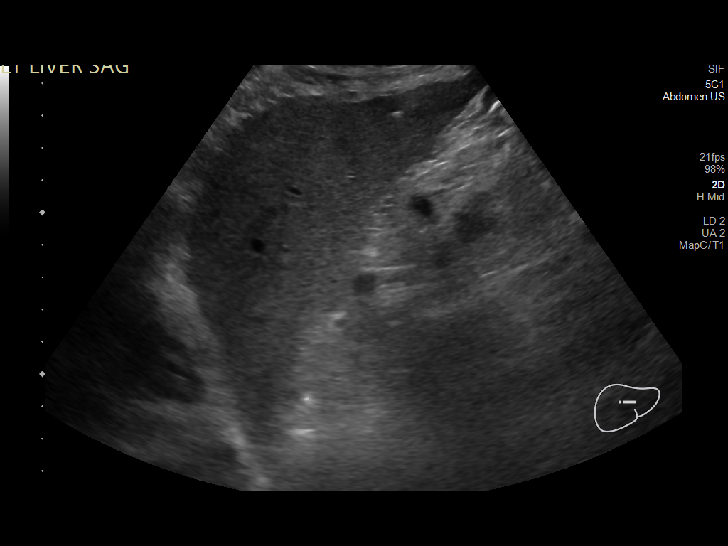
[im 40/60]
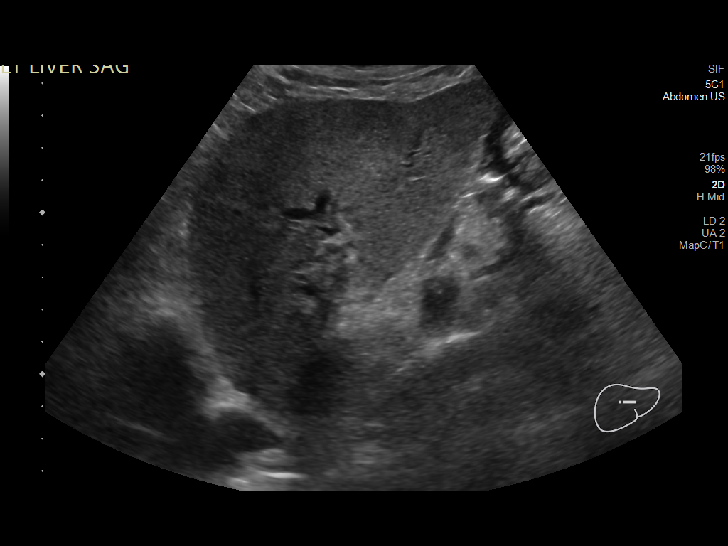
[im 45/60]
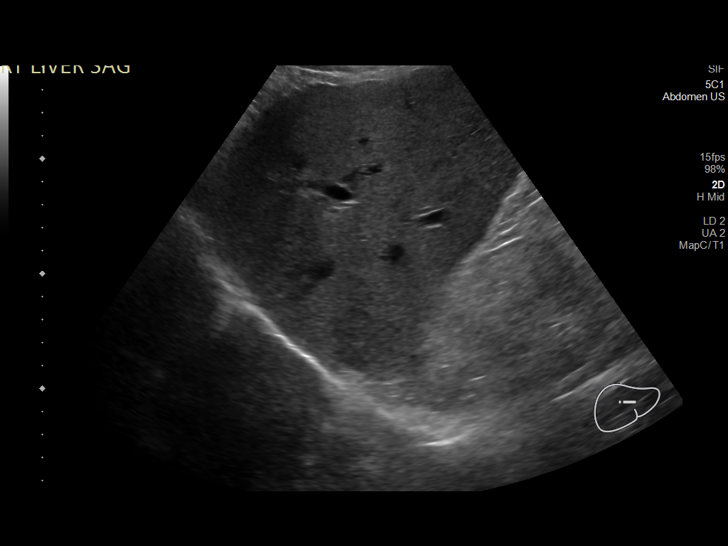
[im 50/60]
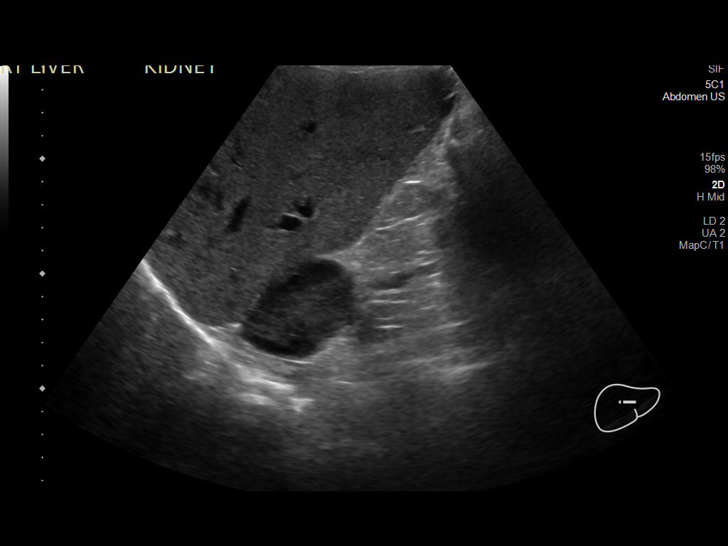
[im 55/60]
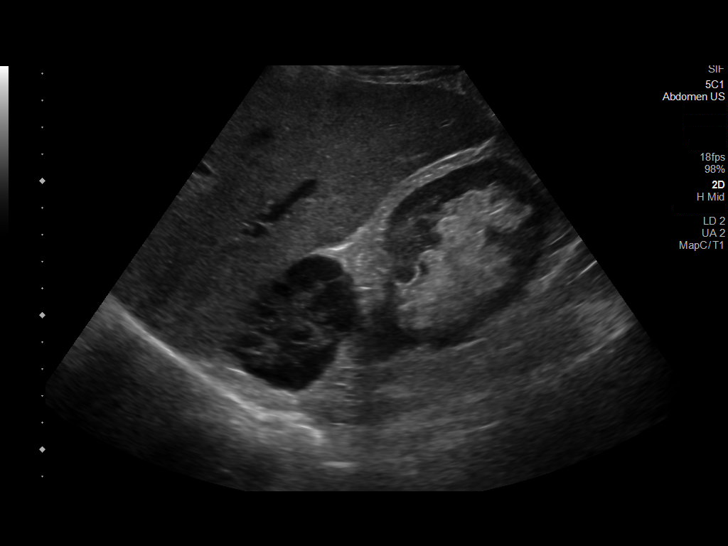
[im 60/60]
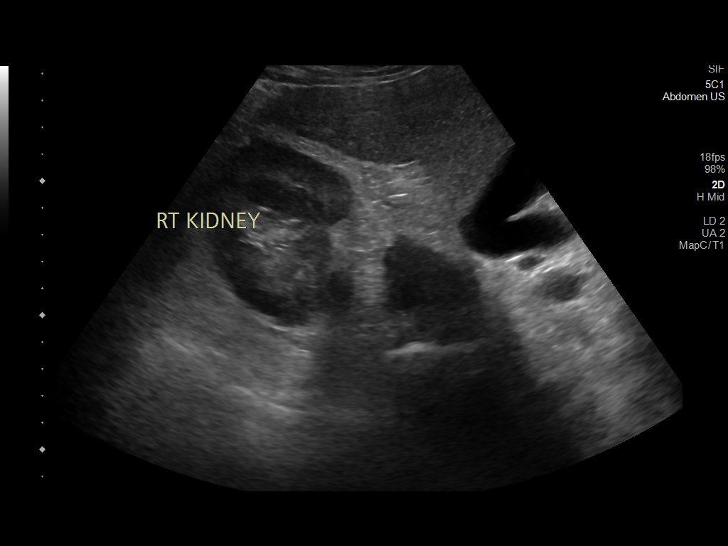

[14 of 25 positions shown; findings below may reference images not displayed]

FINDINGS: Gallbladder:

No gallstones or wall thickening visualized. No sonographic Murphy
sign noted by sonographer.

Common bile duct:

Diameter: 9 mm which is dilated and concerning for distal common
bile duct obstruction.

Liver:

No focal lesion identified. Within normal limits in parenchymal
echogenicity. Portal vein is patent on color Doppler imaging with
normal direction of blood flow towards the liver.

Other: 5.4 x 6.4 x 3.7 cm right adrenal mass is again noted.
IMPRESSION: No cholelithiasis is noted.

Dilated common bile duct is noted concerning for distal common bile
duct obstruction. Correlation with liver function tests is
recommended as well as MRCP.

Continued presence of 6.4 cm right adrenal mass as noted on prior CT
scan.

## 2021-07-21 ENCOUNTER — Other Ambulatory Visit: Payer: Self-pay | Admitting: Hematology & Oncology

## 2021-07-22 ENCOUNTER — Encounter: Payer: Self-pay | Admitting: Hematology & Oncology

## 2021-07-23 ENCOUNTER — Encounter: Payer: Self-pay | Admitting: Hematology & Oncology

## 2021-07-24 ENCOUNTER — Other Ambulatory Visit (HOSPITAL_COMMUNITY): Payer: Self-pay

## 2021-07-31 ENCOUNTER — Other Ambulatory Visit: Payer: Self-pay

## 2021-07-31 ENCOUNTER — Encounter: Payer: Self-pay | Admitting: *Deleted

## 2021-07-31 ENCOUNTER — Inpatient Hospital Stay: Payer: Medicare Other | Attending: Hematology & Oncology | Admitting: Hematology & Oncology

## 2021-07-31 ENCOUNTER — Encounter: Payer: Self-pay | Admitting: Hematology & Oncology

## 2021-07-31 ENCOUNTER — Inpatient Hospital Stay: Payer: Medicare Other

## 2021-07-31 VITALS — BP 94/56 | HR 58 | Temp 98.7°F | Resp 16 | Wt 188.0 lb

## 2021-07-31 DIAGNOSIS — Z79899 Other long term (current) drug therapy: Secondary | ICD-10-CM | POA: Diagnosis not present

## 2021-07-31 DIAGNOSIS — R197 Diarrhea, unspecified: Secondary | ICD-10-CM | POA: Diagnosis not present

## 2021-07-31 DIAGNOSIS — Z7901 Long term (current) use of anticoagulants: Secondary | ICD-10-CM | POA: Diagnosis not present

## 2021-07-31 DIAGNOSIS — C3492 Malignant neoplasm of unspecified part of left bronchus or lung: Secondary | ICD-10-CM | POA: Diagnosis not present

## 2021-07-31 DIAGNOSIS — C7951 Secondary malignant neoplasm of bone: Secondary | ICD-10-CM | POA: Diagnosis not present

## 2021-07-31 DIAGNOSIS — R7989 Other specified abnormal findings of blood chemistry: Secondary | ICD-10-CM | POA: Insufficient documentation

## 2021-07-31 DIAGNOSIS — C3412 Malignant neoplasm of upper lobe, left bronchus or lung: Secondary | ICD-10-CM | POA: Insufficient documentation

## 2021-07-31 DIAGNOSIS — C7971 Secondary malignant neoplasm of right adrenal gland: Secondary | ICD-10-CM | POA: Insufficient documentation

## 2021-07-31 LAB — CBC WITH DIFFERENTIAL (CANCER CENTER ONLY)
Abs Immature Granulocytes: 0.03 10*3/uL (ref 0.00–0.07)
Basophils Absolute: 0 10*3/uL (ref 0.0–0.1)
Basophils Relative: 0 %
Eosinophils Absolute: 0.1 10*3/uL (ref 0.0–0.5)
Eosinophils Relative: 1 %
HCT: 39.8 % (ref 39.0–52.0)
Hemoglobin: 12.6 g/dL — ABNORMAL LOW (ref 13.0–17.0)
Immature Granulocytes: 0 %
Lymphocytes Relative: 12 %
Lymphs Abs: 1 10*3/uL (ref 0.7–4.0)
MCH: 31.7 pg (ref 26.0–34.0)
MCHC: 31.7 g/dL (ref 30.0–36.0)
MCV: 100 fL (ref 80.0–100.0)
Monocytes Absolute: 0.6 10*3/uL (ref 0.1–1.0)
Monocytes Relative: 7 %
Neutro Abs: 6.9 10*3/uL (ref 1.7–7.7)
Neutrophils Relative %: 80 %
Platelet Count: 228 10*3/uL (ref 150–400)
RBC: 3.98 MIL/uL — ABNORMAL LOW (ref 4.22–5.81)
RDW: 13.1 % (ref 11.5–15.5)
WBC Count: 8.8 10*3/uL (ref 4.0–10.5)
nRBC: 0 % (ref 0.0–0.2)

## 2021-07-31 LAB — CMP (CANCER CENTER ONLY)
ALT: 44 U/L (ref 0–44)
AST: 36 U/L (ref 15–41)
Albumin: 4.4 g/dL (ref 3.5–5.0)
Alkaline Phosphatase: 338 U/L — ABNORMAL HIGH (ref 38–126)
Anion gap: 9 (ref 5–15)
BUN: 13 mg/dL (ref 8–23)
CO2: 29 mmol/L (ref 22–32)
Calcium: 10 mg/dL (ref 8.9–10.3)
Chloride: 100 mmol/L (ref 98–111)
Creatinine: 0.89 mg/dL (ref 0.61–1.24)
GFR, Estimated: >60 mL/min (ref >60)
Glucose, Bld: 90 mg/dL (ref 70–99)
Potassium: 4.4 mmol/L (ref 3.5–5.1)
Sodium: 138 mmol/L (ref 135–145)
Total Bilirubin: 0.5 mg/dL (ref 0.3–1.2)
Total Protein: 7.4 g/dL (ref 6.5–8.1)

## 2021-07-31 LAB — LACTATE DEHYDROGENASE: LDH: 229 U/L — ABNORMAL HIGH (ref 98–192)

## 2021-07-31 NOTE — Progress Notes (Signed)
Patient here for follow up on his elevated liver enzymes. Labs have greatly improved since last check. Will restart the Lumakras at a lower dose and slowly titrate.  Oncology Nurse Navigator Documentation  Oncology Nurse Navigator Flowsheets 07/31/2021  Abnormal Finding Date -  Confirmed Diagnosis Date -  Diagnosis Status -  Planned Course of Treatment -  Phase of Treatment -  Chemotherapy Actual Start Date: -  Chemotherapy Actual End Date: -  Surgery Actual Start Date: -  Navigator Follow Up Date: 08/26/2021  Navigator Follow Up Reason: Follow-up Appointment  Navigator Location [facility]  Referral Date to RadOnc/MedOnc -  Navigator Encounter Type Follow-up Appt  Telephone -  Treatment Initiated Date -  Patient Visit Type MedOnc  Treatment Phase Active Tx  Barriers/Navigation Needs Coordination of Care;Education  Education -  Interventions Psycho-Social Support  Acuity Level 2-Minimal Needs (1-2 Barriers Identified)  Coordination of Care -  Education Method -  Support Groups/Services Friends and Family  Time Spent with Patient 15

## 2021-07-31 NOTE — Progress Notes (Signed)
Hematology and Oncology Follow Up Visit  John Hinton [id number] [birth year] 76 y.o. 07/31/2021   Principle Diagnosis:  Stage IVA (T3N2N0) adenocarcinoma of the LUL -- resected -- (+) KRAS -- progression - metastasis to right lung, bilateral adrenal glands, bony metastasis to T1, Y 10 and left iliac bone PE involving the right lower lobe and right middle lobe branches, no right heart strain noted  Past Therapy: S/P cycle 6 of Carbo/Alimta/Pembrolizumab Alimta/pembrolizumab-maintenance therapy s/p cycle 3/4  - start on 11/28/2020 --Alimta discontinued after cycle 2   Current Therapy:        Lumakras 360 mg po q day -- started on 07/31/2021 Xgeva every 3 months - due again 08/2021 Eliquis 5 mg PO BID   Interim History:  John Hinton is here today for follow-up.  He is doing a whole lot better.  He does not have diarrhea.  His liver function studies have basically normalized now.  He feels a lot better.  His appetite is doing better.  There is been no issues with nausea or vomiting.  Clearly, the Lumakras was the etiology for the elevated LFTs and the diarrhea.  We did do a ultrasound of his liver after we saw him last time.  This was unremarkable.  I see him like after couple days off the [facility], he began to feel a little bit better.  We will restart the Lumakras at 360 mg p.o. daily.  We will slowly try to titrate the dose up.  I was glad that his quality life is doing a whole lot better now.  Overall, I would say his performance status is probably ECOG 1.    Medications:  Allergies as of 07/31/2021       Reactions   Amlodipine Swelling   Swelling of ankles        Medication List        Accurate as of July 31, 2021  2:50 PM. If you have any questions, ask your nurse or doctor.          acetaminophen 500 MG tablet Commonly known as: TYLENOL Take 1,000 mg by mouth every 6 (six) hours as needed for moderate pain or headache.   apixaban 5 MG Tabs  tablet Commonly known as: ELIQUIS Take 5 mg by mouth 2 (two) times daily.   aspirin 81 MG tablet Take 81 mg by mouth at bedtime.   B-12 PO Take 1,000 mcg by mouth daily.   cholecalciferol 1000 units tablet Commonly known as: VITAMIN D Take 1,000 Units by mouth at bedtime.   diphenoxylate-atropine 2.5-0.025 MG tablet Commonly known as: LOMOTIL TAKE 2 TABLETS BY MOUTH AT ONSET OF DIARRHEA THEN TAKE 1 TABLET AFTER EACH LOOSE STOOL MAX 8 TABLETS PER DAY   Entresto 24-26 MG Generic drug: sacubitril-valsartan Take 1 tablet by mouth 2 (two) times daily.   ezetimibe 10 MG tablet Commonly known as: ZETIA Take 1 tablet (10 mg total) by mouth daily.   folic acid 1 MG tablet Commonly known as: FOLVITE TAKE ONE TABLET BY MOUTH DAILY START 5 TO 7 DAYS BEFORE ALIMTA CHEMO AND CONTINUE UNTIL 21 DAYS AFTER COMPLETED   Lumakras 120 MG Tabs Generic drug: sotorasib Take 8 tablets (960 mg) by mouth daily after breakfast.   metoprolol succinate 25 MG 24 hr tablet Commonly known as: TOPROL-XL Take 1 tablet (25 mg total) by mouth at bedtime.   nitroGLYCERIN 0.4 MG SL tablet Commonly known as: NITROSTAT Place 1 tablet (0.4 mg total) under the tongue every 5 (  five) minutes as needed for chest pain.   rosuvastatin 40 MG tablet Commonly known as: CRESTOR Take 1 tablet (40 mg total) by mouth at bedtime.   sertraline 50 MG tablet Commonly known as: ZOLOFT Take 50 mg by mouth in the morning and at bedtime.   spironolactone 25 MG tablet Commonly known as: ALDACTONE Take 1 tablet (25 mg total) by mouth daily.   traMADol 50 MG tablet Commonly known as: ULTRAM Take 1 tablet (50 mg total) by mouth every 6 (six) hours as needed.        Allergies:  Allergies  Allergen Reactions   Amlodipine Swelling    Swelling of ankles    Past Medical History, Surgical history, Social history, and Family History were reviewed and updated.  Review of Systems: Review of Systems  Constitutional:  Negative.   HENT: Negative.    Eyes: Negative.   Respiratory: Negative.    Cardiovascular: Negative.   Gastrointestinal: Negative.   Genitourinary: Negative.   Musculoskeletal: Negative.   Skin: Negative.   Neurological: Negative.   Endo/Heme/Allergies: Negative.   Psychiatric/Behavioral: Negative.      Physical Exam:  weight is 188 lb (85.3 kg). His oral temperature is 98.7 F (37.1 C). His blood pressure is 94/56 (abnormal) and his pulse is 58 (abnormal). His respiration is 16 and oxygen saturation is 99%.   Wt Readings from Last 3 Encounters:  07/31/21 188 lb (85.3 kg)  07/17/21 194 lb 1.3 oz (88 kg)  07/04/21 199 lb (90.3 kg)    Physical Exam Vitals reviewed.  HENT:     Head: Normocephalic and atraumatic.  Eyes:     Pupils: Pupils are equal, round, and reactive to light.  Cardiovascular:     Rate and Rhythm: Normal rate and regular rhythm.     Heart sounds: Normal heart sounds.  Pulmonary:     Effort: Pulmonary effort is normal.     Breath sounds: Normal breath sounds.  Abdominal:     General: Bowel sounds are normal.     Palpations: Abdomen is soft.  Musculoskeletal:        General: No tenderness or deformity. Normal range of motion.     Cervical back: Normal range of motion.  Lymphadenopathy:     Cervical: No cervical adenopathy.  Skin:    General: Skin is warm and dry.     Findings: No erythema or rash.  Neurological:     Mental Status: He is alert and oriented to person, place, and time.  Psychiatric:        Behavior: Behavior normal.        Thought Content: Thought content normal.        Judgment: Judgment normal.    Lab Results  Component Value Date   WBC 8.8 07/31/2021   HGB 12.6 (L) 07/31/2021   HCT 39.8 07/31/2021   MCV 100.0 07/31/2021   PLT 228 07/31/2021   Lab Results  Component Value Date   FERRITIN 758 (H) 10/25/2020   IRON 69 10/25/2020   TIBC 321 10/25/2020   UIBC 252 10/25/2020   IRONPCTSAT 22 10/25/2020   Lab Results   Component Value Date   RBC 3.98 (L) 07/31/2021   No results found for: KPAFRELGTCHN, LAMBDASER, KAPLAMBRATIO No results found for: IGGSERUM, IGA, IGMSERUM No results found for: Ronnald Ramp, A1GS, A2GS, Violet Baldy, MSPIKE, SPEI   Chemistry      Component Value Date/Time   NA 138 07/31/2021 1330   NA 142 06/19/2021 1032  K 4.4 07/31/2021 1330   CL 100 07/31/2021 1330   CO2 29 07/31/2021 1330   BUN 13 07/31/2021 1330   BUN 12 06/19/2021 1032   CREATININE 0.89 07/31/2021 1330   CREATININE 1.01 03/26/2016 1009      Component Value Date/Time   CALCIUM 10.0 07/31/2021 1330   ALKPHOS 338 (H) 07/31/2021 1330   AST 36 07/31/2021 1330   ALT 44 07/31/2021 1330   BILITOT 0.5 07/31/2021 1330       Impression and Plan: John Hinton is a very pleasant 76 yo caucasian male with an atypical stage IV lung cancer which was previously resected followed by systemic chemotherapy and maintenance pembrolizumab.   Again, I think he had toxicity from the [facility].  We held this for couple weeks.  Everything seems to have normalized for the most part.  We will restart the Lumakras.  We will start him at 360 mg p.o. daily.  He will let us know if he has problems with diarrhea.  I would like to see him back in 2 weeks.  Hopefully he will be doing well.   [name], MD 1/12/20232:50 PM

## 2021-08-01 ENCOUNTER — Encounter: Payer: Self-pay | Admitting: Hematology & Oncology

## 2021-08-07 ENCOUNTER — Encounter: Payer: Self-pay | Admitting: *Deleted

## 2021-08-08 ENCOUNTER — Encounter: Payer: Self-pay | Admitting: *Deleted

## 2021-08-08 NOTE — Progress Notes (Signed)
See MyChart message dated 08/07/2021. Responded to message, but also attempted to reach out via phone. Left message on voicemail.   Oncology Nurse Navigator Documentation  Oncology Nurse Navigator Flowsheets 08/08/2021  Abnormal Finding Date -  Confirmed Diagnosis Date -  Diagnosis Status -  Planned Course of Treatment -  Phase of Treatment -  Chemotherapy Actual Start Date: -  Chemotherapy Actual End Date: -  Surgery Actual Start Date: -  Navigator Follow Up Date: 08/26/2021  Navigator Follow Up Reason: Follow-up Appointment  Navigator Location [facility]  Referral Date to RadOnc/MedOnc -  Navigator Encounter Type MyChart;Telephone  Telephone Outgoing Call  Treatment Initiated Date -  Patient Visit Type MedOnc  Treatment Phase Active Tx  Barriers/Navigation Needs Coordination of Care;Education  Education Other  Interventions Education  Acuity Level 2-Minimal Needs (1-2 Barriers Identified)  Coordination of Care -  Education Method Written  Support Groups/Services Friends and Family  Time Spent with Patient 15

## 2021-08-26 ENCOUNTER — Telehealth: Payer: Self-pay | Admitting: Pharmacy Technician

## 2021-08-26 ENCOUNTER — Telehealth: Payer: Self-pay | Admitting: Pharmacist

## 2021-08-26 ENCOUNTER — Other Ambulatory Visit: Payer: Self-pay

## 2021-08-26 ENCOUNTER — Encounter: Payer: Self-pay | Admitting: Hematology & Oncology

## 2021-08-26 ENCOUNTER — Inpatient Hospital Stay (HOSPITAL_BASED_OUTPATIENT_CLINIC_OR_DEPARTMENT_OTHER): Payer: Medicare Other | Admitting: Hematology & Oncology

## 2021-08-26 ENCOUNTER — Other Ambulatory Visit (HOSPITAL_COMMUNITY): Payer: Self-pay

## 2021-08-26 ENCOUNTER — Inpatient Hospital Stay: Payer: Medicare Other | Attending: Hematology & Oncology

## 2021-08-26 ENCOUNTER — Encounter: Payer: Self-pay | Admitting: *Deleted

## 2021-08-26 VITALS — BP 154/86 | HR 55 | Temp 98.0°F | Resp 18 | Ht 71.0 in | Wt 180.8 lb

## 2021-08-26 DIAGNOSIS — C7971 Secondary malignant neoplasm of right adrenal gland: Secondary | ICD-10-CM | POA: Diagnosis not present

## 2021-08-26 DIAGNOSIS — C3492 Malignant neoplasm of unspecified part of left bronchus or lung: Secondary | ICD-10-CM

## 2021-08-26 DIAGNOSIS — C7951 Secondary malignant neoplasm of bone: Secondary | ICD-10-CM | POA: Diagnosis not present

## 2021-08-26 DIAGNOSIS — Z79899 Other long term (current) drug therapy: Secondary | ICD-10-CM | POA: Insufficient documentation

## 2021-08-26 DIAGNOSIS — R197 Diarrhea, unspecified: Secondary | ICD-10-CM | POA: Diagnosis not present

## 2021-08-26 DIAGNOSIS — C3412 Malignant neoplasm of upper lobe, left bronchus or lung: Secondary | ICD-10-CM | POA: Insufficient documentation

## 2021-08-26 DIAGNOSIS — R7989 Other specified abnormal findings of blood chemistry: Secondary | ICD-10-CM | POA: Diagnosis not present

## 2021-08-26 DIAGNOSIS — C7972 Secondary malignant neoplasm of left adrenal gland: Secondary | ICD-10-CM | POA: Diagnosis not present

## 2021-08-26 LAB — CBC WITH DIFFERENTIAL (CANCER CENTER ONLY)
Abs Immature Granulocytes: 0.01 10*3/uL (ref 0.00–0.07)
Basophils Absolute: 0 10*3/uL (ref 0.0–0.1)
Basophils Relative: 0 %
Eosinophils Absolute: 0.1 10*3/uL (ref 0.0–0.5)
Eosinophils Relative: 1 %
HCT: 39.2 % (ref 39.0–52.0)
Hemoglobin: 12.6 g/dL — ABNORMAL LOW (ref 13.0–17.0)
Immature Granulocytes: 0 %
Lymphocytes Relative: 12 %
Lymphs Abs: 0.7 10*3/uL (ref 0.7–4.0)
MCH: 31.9 pg (ref 26.0–34.0)
MCHC: 32.1 g/dL (ref 30.0–36.0)
MCV: 99.2 fL (ref 80.0–100.0)
Monocytes Absolute: 0.6 10*3/uL (ref 0.1–1.0)
Monocytes Relative: 9 %
Neutro Abs: 4.9 10*3/uL (ref 1.7–7.7)
Neutrophils Relative %: 78 %
Platelet Count: 191 10*3/uL (ref 150–400)
RBC: 3.95 MIL/uL — ABNORMAL LOW (ref 4.22–5.81)
RDW: 15.1 % (ref 11.5–15.5)
WBC Count: 6.4 10*3/uL (ref 4.0–10.5)
nRBC: 0 % (ref 0.0–0.2)

## 2021-08-26 LAB — CMP (CANCER CENTER ONLY)
ALT: 194 U/L — ABNORMAL HIGH (ref 0–44)
AST: 220 U/L (ref 15–41)
Albumin: 4 g/dL (ref 3.5–5.0)
Alkaline Phosphatase: 917 U/L — ABNORMAL HIGH (ref 38–126)
Anion gap: 6 (ref 5–15)
BUN: 10 mg/dL (ref 8–23)
CO2: 32 mmol/L (ref 22–32)
Calcium: 9.5 mg/dL (ref 8.9–10.3)
Chloride: 104 mmol/L (ref 98–111)
Creatinine: 0.78 mg/dL (ref 0.61–1.24)
GFR, Estimated: >60 mL/min (ref >60)
Glucose, Bld: 84 mg/dL (ref 70–99)
Potassium: 4.1 mmol/L (ref 3.5–5.1)
Sodium: 142 mmol/L (ref 135–145)
Total Bilirubin: 1.1 mg/dL (ref 0.3–1.2)
Total Protein: 7 g/dL (ref 6.5–8.1)

## 2021-08-26 LAB — LACTATE DEHYDROGENASE: LDH: 227 U/L — ABNORMAL HIGH (ref 98–192)

## 2021-08-26 MED ORDER — ADAGRASIB 200 MG PO TABS
400.0000 mg | ORAL_TABLET | Freq: Two times a day (BID) | ORAL | 4 refills | Status: DC
  Filled 2021-08-26: qty 180, fill #0

## 2021-08-26 NOTE — Telephone Encounter (Signed)
Oral Oncology Patient Advocate Encounter   Received notification from [facility] that prior authorization for John Hinton is required.   PA submitted on CoverMyMeds Key [id number] Status is pending   Oral Oncology Clinic will continue to follow.  [place] Patient John Hinton Phone [phone] Fax [phone] 08/26/2021 3:27 PM

## 2021-08-26 NOTE — Telephone Encounter (Signed)
Oral Oncology Pharmacist Encounter  Received new prescription for [name]) for the treatment of metastatic non-small cell lung cancer, KRAS G12C-mutated, planned duration until disease progression or unacceptable drug toxicity.  CBC w/ Diff and CMP from 08/26/21 assessed, noted AST 220 U/L, ALT 194 U/L and alk phos 917 U/L, T. Bili 1.1 mg/dL - this is secondary to [place]. No baseline hepatic dose adjustments recommended in package insert. Note, patient will take treatment break prior to starting [place]. Patient starting on dose reduction of Krazati per MD discretion.   Current medication list in Epic reviewed, DDIs with [name] identified: Category C DDI between [place] and [place] may increase bleed risk from Apixaban due to [place] being a strong CYP3A4 Inhibitor. Will counsel patient to monitor for increase s/sx of bleeding and bruising. Note patient starting on reduced dose of Krazati, thus bleed risk may be slightly less.  Category C DDI between [place] and Sertraline - risk of Qtc prolongation with concomitant use of agents, EKGs from 03/2021 and 04/2021 with EKG 464 ms and 453m. PI recommends dose reduction and or holding if change in Qtc while on therapy is > 500 ms. Patient starting on dose reduction of Krazati at baseline. No changes warranted at this time.  Category C DDI between [place] Tramadol - KAlfred Levinsbeing a strong CYP3A4 inhibitor may increase serum concentrations of tramadol, but due to it also having moderate CYP2D6 activity, this may be lesser than expected. No change in therapy warranted at this time. Category C DDI between [place] Metoprolol succinate - Krazati may increase serum concentrations of metoprolol via CYP2D6 inhibition. Recommend monitoring for decreased HR, fatigue while on combination of therapy.   Evaluated chart and no patient barriers to medication adherence noted.   Patient agreement for treatment documented in MD note on  08/26/21.  [facility].  Oral Oncology Clinic will continue to follow for insurance authorization, copayment issues, initial counseling and start date.  RLeron Croak PharmD, BCPS Hematology/Oncology Clinical Pharmacist [phone]/01/2022 2:17 PM

## 2021-08-26 NOTE — Progress Notes (Signed)
Unfortunately the patient's liver function is once again affected by the [place]. This will be discontinued and he will prescribe Adagrasib. Prescription has been sent. Patient will also need PET prior to his next appointment.   Will ensure PET is scheduled once PA obtained.  Oncology Nurse Navigator Documentation  Oncology Nurse Navigator Flowsheets 08/26/2021  Abnormal Finding Date -  Confirmed Diagnosis Date -  Diagnosis Status -  Planned Course of Treatment -  Phase of Treatment -  Chemotherapy Actual Start Date: -  Chemotherapy Actual End Date: -  Surgery Actual Start Date: -  Navigator Follow Up Date: 08/28/2021  Navigator Follow Up Reason: Radiology  Navigator Location [facility]  Referral Date to RadOnc/MedOnc -  Navigator Encounter Type Appt/Treatment Plan Review  Telephone -  Treatment Initiated Date -  Patient Visit Type MedOnc  Treatment Phase Active Tx  Barriers/Navigation Needs Coordination of Care;Education  Education -  Interventions None Required  Acuity Level 2-Minimal Needs (1-2 Barriers Identified)  Coordination of Care -  Education Method -  Support Groups/Services Friends and Family  Time Spent with Patient 15

## 2021-08-26 NOTE — Progress Notes (Signed)
Hematology and Oncology Follow Up Visit  John Hinton [id number] Jul 12, 1946 76 y.o. 08/26/2021   Principle Diagnosis:  Stage IVA (T3N2N0) adenocarcinoma of the LUL -- resected -- (+) KRAS -- progression - metastasis to right lung, bilateral adrenal glands, bony metastasis to T1, Y 10 and left iliac bone PE involving the right lower lobe and right middle lobe branches, no right heart strain noted  Past Therapy: S/P cycle 6 of Carbo/Alimta/Pembrolizumab Alimta/pembrolizumab-maintenance therapy s/p cycle 3/4  - start on 11/28/2020 --Alimta discontinued after cycle 2   Current Therapy:        Lumakras 360 mg po q day -- started on 07/31/2021 -- d/c on 02/07/203 for toxicity Xgeva every 3 months - due again 08/2021 Eliquis 5 mg PO BID Adagrasib 400 mg po BID -- start on 09/24/2021   Interim History:  John Hinton is here today for follow-up.  Unfortunately, he just is not can tolerate the Lumakras.  His liver function studies are elevated again.  He is having a lot of diarrhea.  His quality of life is just not good right now.  He is losing weight.  He did this before with Lumakras.  We held the [facility] for a few weeks.  We then restarted Lumakras at a low dose.  However, the liver function studies are back up again.  At this point, we will going to stop the Lumakras.  Thankfully, the FDA recently approved the new K-ras inhibitor- adagrasib -and hopefully we will be able to use this.  We will not start anything until after he and his wife get back from vacation.  They are going to [place], [place] to be with the family and ski.  Again he is having a lot of diarrhea.  I told him he can take Lomotil to 12 times a day if he needs to.  He says food does not taste all that well.  He is not complain of any pain.  He has had no problems with cough or shortness of breath.  He has had no urinary issues.  He has had no bleeding.  Overall, I would say his performance status is probably ECOG 1.     Medications:  Allergies as of 08/26/2021       Reactions   Amlodipine Swelling   Swelling of ankles        Medication List        Accurate as of August 26, 2021  1:31 PM. If you have any questions, ask your nurse or doctor.          acetaminophen 500 MG tablet Commonly known as: TYLENOL Take 1,000 mg by mouth every 6 (six) hours as needed for moderate pain or headache.   apixaban 5 MG Tabs tablet Commonly known as: ELIQUIS Take 5 mg by mouth 2 (two) times daily.   aspirin 81 MG tablet Take 81 mg by mouth at bedtime.   B-12 PO Take 1,000 mcg by mouth daily.   cholecalciferol 1000 units tablet Commonly known as: VITAMIN D Take 1,000 Units by mouth at bedtime.   diphenoxylate-atropine 2.5-0.025 MG tablet Commonly known as: LOMOTIL TAKE 2 TABLETS BY MOUTH AT ONSET OF DIARRHEA THEN TAKE 1 TABLET AFTER EACH LOOSE STOOL MAX 8 TABLETS PER DAY   Entresto 24-26 MG Generic drug: sacubitril-valsartan Take 1 tablet by mouth 2 (two) times daily.   ezetimibe 10 MG tablet Commonly known as: ZETIA Take 1 tablet (10 mg total) by mouth daily.   folic acid 1 MG tablet  Commonly known as: FOLVITE TAKE ONE TABLET BY MOUTH DAILY START 5 TO 7 DAYS BEFORE ALIMTA CHEMO AND CONTINUE UNTIL 21 DAYS AFTER COMPLETED   Lumakras 120 MG tablet Generic drug: sotorasib Take 8 tablets (960 mg) by mouth daily after breakfast.   metoprolol succinate 25 MG 24 hr tablet Commonly known as: TOPROL-XL Take 1 tablet (25 mg total) by mouth at bedtime.   nitroGLYCERIN 0.4 MG SL tablet Commonly known as: NITROSTAT Place 1 tablet (0.4 mg total) under the tongue every 5 (five) minutes as needed for chest pain.   rosuvastatin 40 MG tablet Commonly known as: CRESTOR Take 1 tablet (40 mg total) by mouth at bedtime.   sertraline 50 MG tablet Commonly known as: ZOLOFT Take 50 mg by mouth in the morning and at bedtime.   spironolactone 25 MG tablet Commonly known as: ALDACTONE Take 1 tablet (25  mg total) by mouth daily.   traMADol 50 MG tablet Commonly known as: ULTRAM Take 1 tablet (50 mg total) by mouth every 6 (six) hours as needed.        Allergies:  Allergies  Allergen Reactions   Amlodipine Swelling    Swelling of ankles    Past Medical History, Surgical history, Social history, and Family History were reviewed and updated.  Review of Systems: Review of Systems  Constitutional: Negative.   HENT: Negative.    Eyes: Negative.   Respiratory: Negative.    Cardiovascular: Negative.   Gastrointestinal: Negative.   Genitourinary: Negative.   Musculoskeletal: Negative.   Skin: Negative.   Neurological: Negative.   Endo/Heme/Allergies: Negative.   Psychiatric/Behavioral: Negative.      Physical Exam:  height is '5\' 11"'  (1.803 m) and weight is 180 lb 12 oz (82 kg). His oral temperature is 98 F (36.7 C). His blood pressure is 154/86 (abnormal) and his pulse is 55 (abnormal). His respiration is 18 and oxygen saturation is 100%.   Wt Readings from Last 3 Encounters:  08/26/21 180 lb 12 oz (82 kg)  07/31/21 188 lb (85.3 kg)  07/17/21 194 lb 1.3 oz (88 kg)    Physical Exam Vitals reviewed.  HENT:     Head: Normocephalic and atraumatic.  Eyes:     Pupils: Pupils are equal, round, and reactive to light.  Cardiovascular:     Rate and Rhythm: Normal rate and regular rhythm.     Heart sounds: Normal heart sounds.  Pulmonary:     Effort: Pulmonary effort is normal.     Breath sounds: Normal breath sounds.  Abdominal:     General: Bowel sounds are normal.     Palpations: Abdomen is soft.  Musculoskeletal:        General: No tenderness or deformity. Normal range of motion.     Cervical back: Normal range of motion.  Lymphadenopathy:     Cervical: No cervical adenopathy.  Skin:    General: Skin is warm and dry.     Findings: No erythema or rash.  Neurological:     Mental Status: He is alert and oriented to person, place, and time.  Psychiatric:         Behavior: Behavior normal.        Thought Content: Thought content normal.        Judgment: Judgment normal.    Lab Results  Component Value Date   WBC 6.4 08/26/2021   HGB 12.6 (L) 08/26/2021   HCT 39.2 08/26/2021   MCV 99.2 08/26/2021   PLT 191 08/26/2021  Lab Results  Component Value Date   FERRITIN 758 (H) 10/25/2020   IRON 69 10/25/2020   TIBC 321 10/25/2020   UIBC 252 10/25/2020   IRONPCTSAT 22 10/25/2020   Lab Results  Component Value Date   RBC 3.95 (L) 08/26/2021   No results found for: KPAFRELGTCHN, LAMBDASER, KAPLAMBRATIO No results found for: IGGSERUM, IGA, IGMSERUM No results found for: Odetta Pink, SPEI   Chemistry      Component Value Date/Time   NA 142 08/26/2021 1155   NA 142 06/19/2021 1032   K 4.1 08/26/2021 1155   CL 104 08/26/2021 1155   CO2 32 08/26/2021 1155   BUN 10 08/26/2021 1155   BUN 12 06/19/2021 1032   CREATININE 0.78 08/26/2021 1155   CREATININE 1.01 03/26/2016 1009      Component Value Date/Time   CALCIUM 9.5 08/26/2021 1155   ALKPHOS 917 (H) 08/26/2021 1155   AST 220 (HH) 08/26/2021 1155   ALT 194 (H) 08/26/2021 1155   BILITOT 1.1 08/26/2021 1155       Impression and Plan: Mr. Minniefield is a very pleasant 76 yo caucasian male with an atypical stage IV lung cancer which was previously resected followed by systemic chemotherapy and maintenance pembrolizumab.   Again, we are stopping the Lumakras.  The LFT elevation and diarrhea are too much for him.  This is at a low dose.  Again, I hope that he will not have problems with the adagrasib.     Again, we will not start until after he gets back.  I will also get a PET scan on him before we restart the adagrasib.  I just hate that he cannot tolerate the Lumakras.  Again, quality of life is our focus.  This is our main priority for him.  I will plan to see him back in 1 month.  I hope that he has a wonderful vacation and  will be able to enjoy his family.    [name], MD 2/7/20231:31 PM

## 2021-08-26 NOTE — Telephone Encounter (Signed)
Oral Oncology Patient Advocate Encounter  Prior Authorization for [name] has been approved.    PA# [id number] Effective dates: 07/20/21 through 08/26/22  Patients co-pay is $2876.68  Oral Oncology Clinic will continue to follow.   [name] John Hinton Phone [phone] Fax [phone] 08/26/2021 3:48 PM

## 2021-08-27 ENCOUNTER — Other Ambulatory Visit (HOSPITAL_COMMUNITY): Payer: Self-pay

## 2021-08-28 ENCOUNTER — Encounter: Payer: Self-pay | Admitting: *Deleted

## 2021-08-28 NOTE — Progress Notes (Signed)
Scheduled PET scan for 09/22/2021.  Called patient and educated him to PET appointment including date, time and location. Reviewed PET prep instructions with him. Radiology info sheet also mailed to patient home for education reinforcement.   Oncology Nurse Navigator Documentation  Oncology Nurse Navigator Flowsheets 08/28/2021  Abnormal Finding Date -  Confirmed Diagnosis Date -  Diagnosis Status -  Planned Course of Treatment -  Phase of Treatment -  Chemotherapy Actual Start Date: -  Chemotherapy Actual End Date: -  Surgery Actual Start Date: -  Navigator Follow Up Date: 09/22/2021  Navigator Follow Up Reason: Scan Review  Navigator Location [facility]  Referral Date to RadOnc/MedOnc -  Navigator Encounter Type Telephone;Appt/Treatment Plan Review  Telephone Appt Confirmation/Clarification;Education;Outgoing Call  Treatment Initiated Date -  Patient Visit Type MedOnc  Treatment Phase Active Tx  Barriers/Navigation Needs Coordination of Care;Education  Education Other  Interventions Coordination of Care;Education  Acuity Level 2-Minimal Needs (1-2 Barriers Identified)  Coordination of Care Appts;Radiology  Education Method Verbal;Written  Support Groups/Services Friends and Family  Time Spent with Patient 30

## 2021-09-01 MED ORDER — ADAGRASIB 200 MG PO TABS: 400.0000 mg | ORAL_TABLET | Freq: Two times a day (BID) | ORAL | 4 refills | Status: DC

## 2021-09-02 ENCOUNTER — Encounter: Payer: Self-pay | Admitting: *Deleted

## 2021-09-02 DIAGNOSIS — C3492 Malignant neoplasm of unspecified part of left bronchus or lung: Secondary | ICD-10-CM

## 2021-09-02 NOTE — Progress Notes (Signed)
Received a call from patient's wife, Pamala Hurry. She is concerned because patient drank "quite a bit" of alcohol on Sunday and since that time he seems to be more confused and disoriented. She mentions that his liver function is currently poor, and she wonders if the excessive drinking caused more problems, or if the alcohol isn't being metabolized appropriately.   Offered to bring patient in for lab work and potential IVF. Appointment made for tomorrow morning. Lab orders placed.   Pamala Hurry knows to continue to encourage water and food intake until he is seen.   Oncology Nurse Navigator Documentation  Oncology Nurse Navigator Flowsheets 09/02/2021  Abnormal Finding Date -  Confirmed Diagnosis Date -  Diagnosis Status -  Planned Course of Treatment -  Phase of Treatment -  Chemotherapy Actual Start Date: -  Chemotherapy Actual End Date: -  Surgery Actual Start Date: -  Navigator Follow Up Date: 09/03/2021  Navigator Follow Up Reason: Symptom Management  Navigator Location [facility]  Referral Date to RadOnc/MedOnc -  Navigator Encounter Type Telephone  Telephone Symptom Mgt;Incoming Call  Treatment Initiated Date -  Patient Visit Type MedOnc  Treatment Phase Active Tx  Barriers/Navigation Needs Coordination of Care;Education  Education Pain/ Symptom Management  Interventions Coordination of Care;Education;Psycho-Social Support  Acuity Level 2-Minimal Needs (1-2 Barriers Identified)  Coordination of Care Appts  Education Method Verbal  Support Groups/Services Friends and Family  Time Spent with Patient 30

## 2021-09-03 ENCOUNTER — Inpatient Hospital Stay (HOSPITAL_BASED_OUTPATIENT_CLINIC_OR_DEPARTMENT_OTHER): Payer: Medicare Other | Admitting: Hematology & Oncology

## 2021-09-03 ENCOUNTER — Encounter: Payer: Self-pay | Admitting: Hematology & Oncology

## 2021-09-03 ENCOUNTER — Other Ambulatory Visit: Payer: Self-pay

## 2021-09-03 ENCOUNTER — Inpatient Hospital Stay: Payer: Medicare Other

## 2021-09-03 ENCOUNTER — Ambulatory Visit (HOSPITAL_COMMUNITY)
Admission: RE | Admit: 2021-09-03 | Discharge: 2021-09-03 | Disposition: A | Discharge status: Home / Self Care | Payer: Medicare Other | Source: Ambulatory Visit | Attending: Hematology & Oncology | Admitting: Hematology & Oncology

## 2021-09-03 ENCOUNTER — Encounter: Payer: Self-pay | Admitting: *Deleted

## 2021-09-03 VITALS — BP 109/54 | HR 68 | Resp 18

## 2021-09-03 VITALS — BP 109/54 | HR 68 | Temp 99.0°F | Resp 18

## 2021-09-03 DIAGNOSIS — C3490 Malignant neoplasm of unspecified part of unspecified bronchus or lung: Secondary | ICD-10-CM | POA: Diagnosis not present

## 2021-09-03 DIAGNOSIS — C3492 Malignant neoplasm of unspecified part of left bronchus or lung: Secondary | ICD-10-CM | POA: Diagnosis not present

## 2021-09-03 DIAGNOSIS — E860 Dehydration: Secondary | ICD-10-CM

## 2021-09-03 LAB — CMP (CANCER CENTER ONLY)
ALT: 64 U/L — ABNORMAL HIGH (ref 0–44)
AST: 47 U/L — ABNORMAL HIGH (ref 15–41)
Albumin: 3.7 g/dL (ref 3.5–5.0)
Alkaline Phosphatase: 432 U/L — ABNORMAL HIGH (ref 38–126)
Anion gap: 10 (ref 5–15)
BUN: 13 mg/dL (ref 8–23)
CO2: 27 mmol/L (ref 22–32)
Calcium: 9 mg/dL (ref 8.9–10.3)
Chloride: 100 mmol/L (ref 98–111)
Creatinine: 0.74 mg/dL (ref 0.61–1.24)
GFR, Estimated: >60 mL/min (ref >60)
Glucose, Bld: 92 mg/dL (ref 70–99)
Potassium: 3.5 mmol/L (ref 3.5–5.1)
Sodium: 137 mmol/L (ref 135–145)
Total Bilirubin: 1.4 mg/dL — ABNORMAL HIGH (ref 0.3–1.2)
Total Protein: 7.3 g/dL (ref 6.5–8.1)

## 2021-09-03 LAB — CBC WITH DIFFERENTIAL (CANCER CENTER ONLY)
Abs Immature Granulocytes: 0.08 10*3/uL — ABNORMAL HIGH (ref 0.00–0.07)
Basophils Absolute: 0 10*3/uL (ref 0.0–0.1)
Basophils Relative: 0 %
Eosinophils Absolute: 0.1 10*3/uL (ref 0.0–0.5)
Eosinophils Relative: 1 %
HCT: 35.8 % — ABNORMAL LOW (ref 39.0–52.0)
Hemoglobin: 11.8 g/dL — ABNORMAL LOW (ref 13.0–17.0)
Immature Granulocytes: 1 %
Lymphocytes Relative: 7 %
Lymphs Abs: 0.9 10*3/uL (ref 0.7–4.0)
MCH: 32.4 pg (ref 26.0–34.0)
MCHC: 33 g/dL (ref 30.0–36.0)
MCV: 98.4 fL (ref 80.0–100.0)
Monocytes Absolute: 1.1 10*3/uL — ABNORMAL HIGH (ref 0.1–1.0)
Monocytes Relative: 8 %
Neutro Abs: 11.6 10*3/uL — ABNORMAL HIGH (ref 1.7–7.7)
Neutrophils Relative %: 83 %
Platelet Count: 238 10*3/uL (ref 150–400)
RBC: 3.64 MIL/uL — ABNORMAL LOW (ref 4.22–5.81)
RDW: 14.4 % (ref 11.5–15.5)
WBC Count: 13.9 10*3/uL — ABNORMAL HIGH (ref 4.0–10.5)
nRBC: 0 % (ref 0.0–0.2)

## 2021-09-03 LAB — AMMONIA: Ammonia: 21 umol/L (ref 9–35)

## 2021-09-03 IMAGING — MR MR HEAD WO/W CM
15 series · 48 of 48 positions shown · IV contrast (gadavist)
Comparison: [DATE]

CLINICAL DATA: Staging of non-small cell lung carcinoma

EXAM:
MRI HEAD WITHOUT AND WITH CONTRAST
TECHNIQUE: Multiplanar, multiecho pulse sequences of the brain and surrounding
structures were obtained without and with intravenous contrast.
CONTRAST:  8mL GADAVIST GADOBUTROL 1 MMOL/ML IV SOLN

[Series 5: DWI · axial · 3.0mm · 1.36mm/px · z∈[-44,+95]mm · 6 of 96 slices shown (1 of 2)]
[im 1/96]
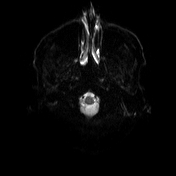
[im 20/96]
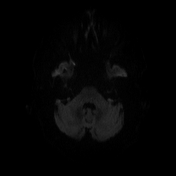
[im 39/96]
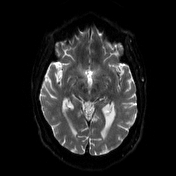
[im 58/96]
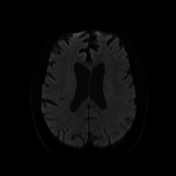
[im 77/96]
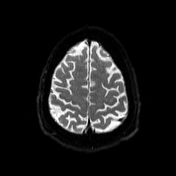
[im 96/96]
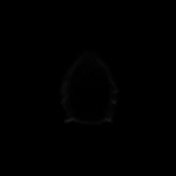

[Series 6: DWI · axial · 3.0mm · 1.36mm/px · z∈[-44,+95]mm · 3 of 48 slices shown (2 of 2)]
[im 1/48]
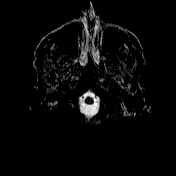
[im 24/48]
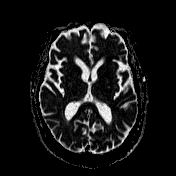
[im 48/48]
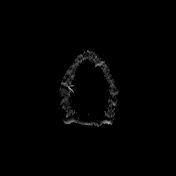

[Series 7: T1 · sagittal · 5.0mm · 0.75mm/px · 1 of 24 slices shown (1 of 4)]
[im 1/24]
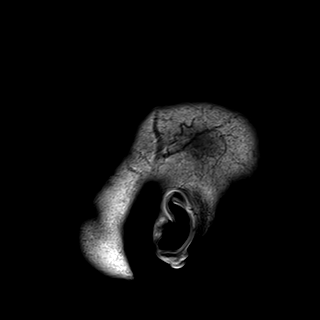

[Series 8: T2 · axial · 5.0mm · 0.62mm/px · 1 of 26 slices shown]
[im 1/26]
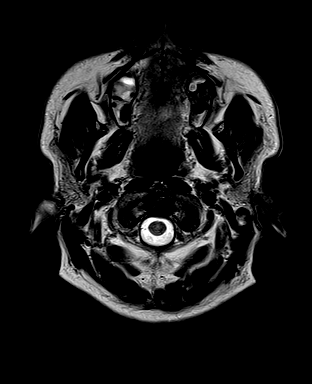

[Series 9: swi_images · axial · 3.0mm · 0.75mm/px · z∈[-56,+107]mm · 3 of 56 slices shown]
[im 1/56]
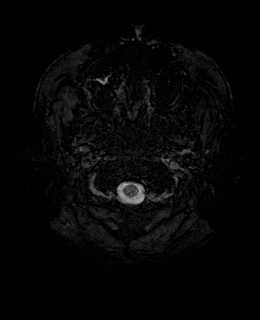
[im 28/56]
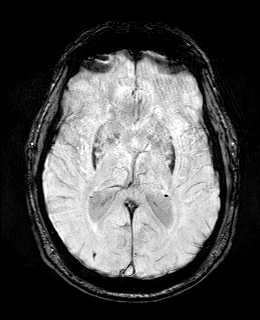
[im 56/56]
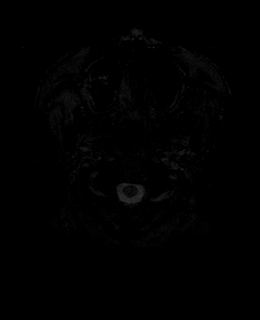

[Series 11: FLAIR · axial · 3.0mm · 0.75mm/px · z∈[-50,+101]mm · 3 of 52 slices shown]
[im 1/52]
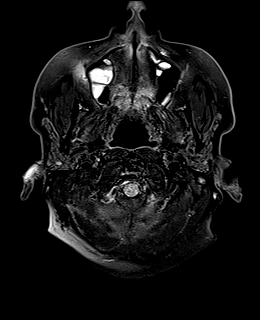
[im 26/52]
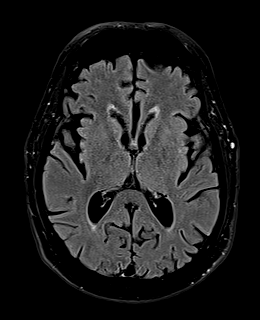
[im 52/52]
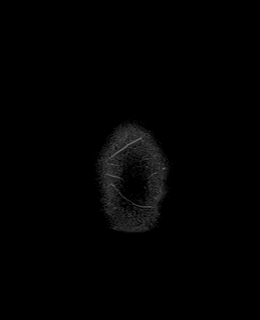

[Series 12: T1 · axial · 1.0mm · 0.94mm/px · z∈[-45,+96]mm · 8 of 144 slices shown (2 of 4)]
[im 1/144]
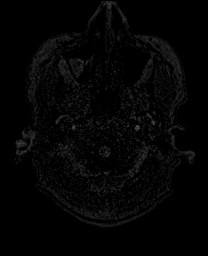
[im 21/144]
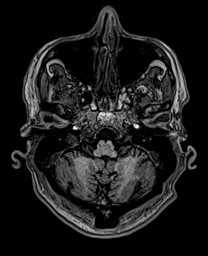
[im 41/144]
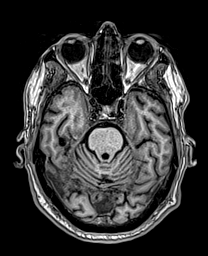
[im 62/144]
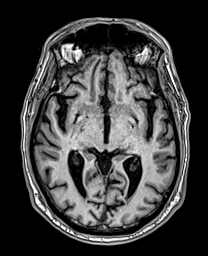
[im 82/144]
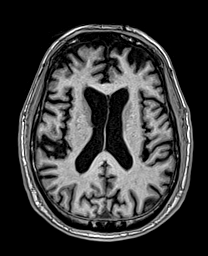
[im 103/144]
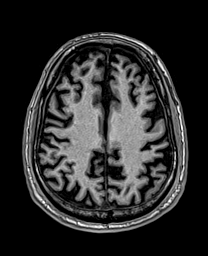
[im 123/144]
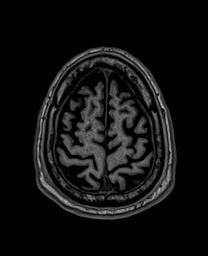
[im 144/144]
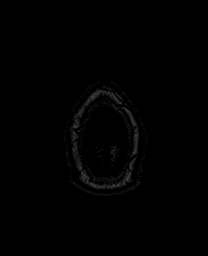

[Series 13: cor dwi_tracew · coronal · 5.0mm · 1.53mm/px · 3 of 60 slices shown]
[im 1/60]
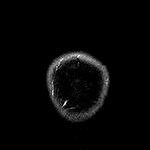
[im 30/60]
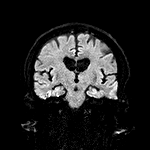
[im 60/60]
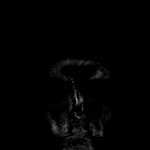

[Series 14: cor dwi_adc · coronal · 5.0mm · 1.53mm/px · 2 of 30 slices shown]
[im 1/30]
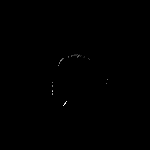
[im 30/30]
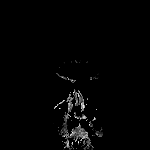

[Series 15: T2 post-contrast · coronal · 5.0mm · 0.57mm/px · 2 of 30 slices shown]
[im 1/30]
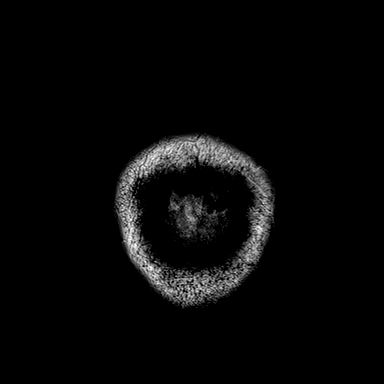
[im 30/30]
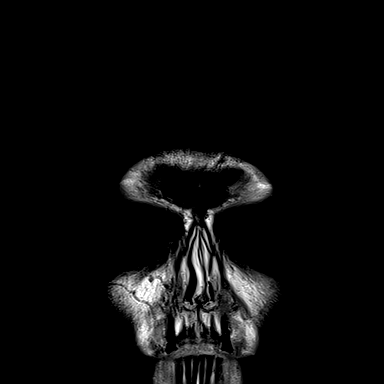

[Series 16: T1 post-contrast · axial · 1.0mm · 0.94mm/px · z∈[-45,+96]mm · 8 of 144 slices shown (1 of 3)]
[im 1/144]
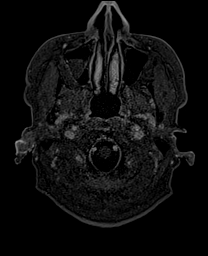
[im 21/144]
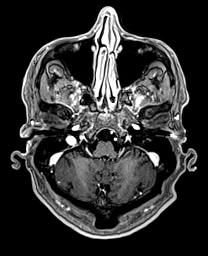
[im 41/144]
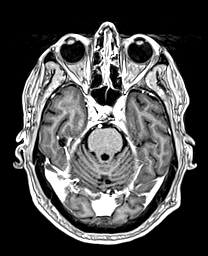
[im 62/144]
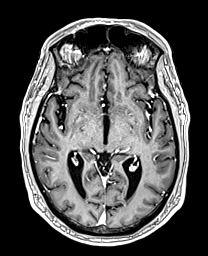
[im 82/144]
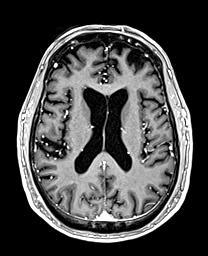
[im 103/144]
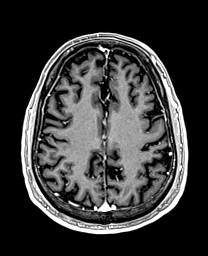
[im 123/144]
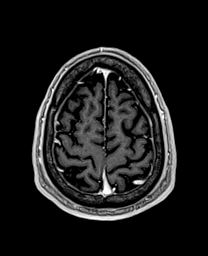
[im 144/144]
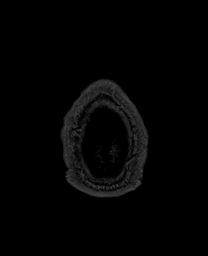

[Series 17: T1 · sagittal · 4.0mm · 0.94mm/px · 2 of 36 slices shown (3 of 4)]
[im 1/36]
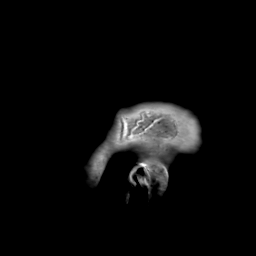
[im 36/36]
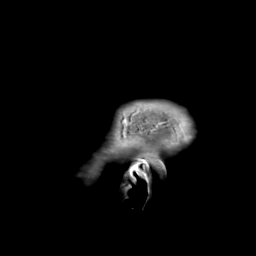

[Series 18: T1 · coronal · 4.0mm · 0.94mm/px · 3 of 46 slices shown (4 of 4)]
[im 1/46]
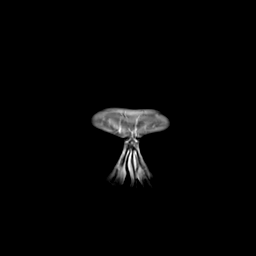
[im 23/46]
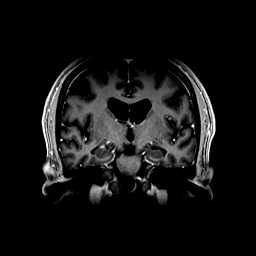
[im 46/46]
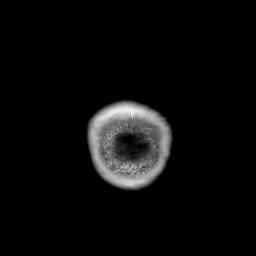

[Series 19: T1 post-contrast · coronal · 5.0mm · 0.43mm/px · 2 of 30 slices shown (2 of 3)]
[im 1/30]
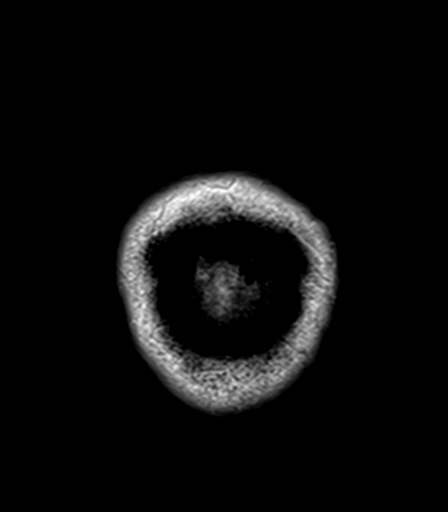
[im 30/30]
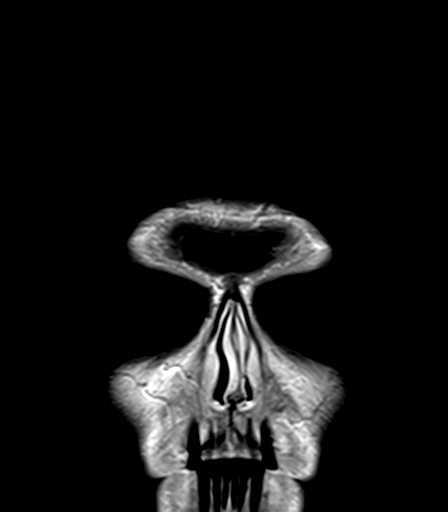

[Series 20: T1 post-contrast · sagittal · 5.0mm · 0.75mm/px · 1 of 24 slices shown (3 of 3)]
[im 1/24]
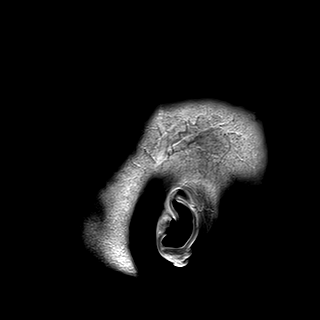

[48 of 48 positions shown; findings below may reference images not displayed]

FINDINGS: Brain: No acute infarct, mass effect or extra-axial collection. No
acute or chronic hemorrhage. There is multifocal hyperintense
T2-weighted signal within the white matter. Generalized volume loss
without a clear lobar predilection. The midline structures are
normal. There is no abnormal contrast enhancement.

Vascular: Major flow voids are preserved.

Skull and upper cervical spine: Normal calvarium and skull base.
Visualized upper cervical spine and soft tissues are normal.

Sinuses/Orbits:No paranasal sinus fluid levels or advanced mucosal
thickening. No mastoid or middle ear effusion. Normal orbits.
IMPRESSION: 1. No intracranial metastatic disease.
2. Mild chronic microvascular disease and generalized volume loss.

## 2021-09-03 MED ORDER — SODIUM CHLORIDE 0.9 % IV SOLN: Freq: Once | INTRAVENOUS | Status: AC

## 2021-09-03 MED ORDER — GADOBUTROL 1 MMOL/ML IV SOLN
8.0000 mL | Freq: Once | INTRAVENOUS | Status: AC | PRN
  Administered 2021-09-03: 8 mL via INTRAVENOUS

## 2021-09-03 NOTE — Progress Notes (Deleted)
Hematology and Oncology Follow Up Visit  John Hinton [id number] 1946-05-23 76 y.o. 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  He is doing a whole lot better.  He does not have diarrhea.  His liver function studies have basically normalized now.  He feels a lot better.  His appetite is doing better.  There is been no issues with nausea or vomiting.  Clearly, the Lumakras was the etiology for the elevated LFTs and the diarrhea.  We did do a ultrasound of his liver after we saw him last time.  This was unremarkable.  I see him like after couple days off the [facility], he began to feel a little bit better.  We will restart the Lumakras at 360 mg p.o. daily.  We will slowly try to titrate the dose up.  I was glad that his quality life is doing a whole lot better now.  Overall, I would say his performance status is probably ECOG 1.    Medications:  Allergies as of 09/03/2021       Reactions   Amlodipine Swelling   Swelling of ankles        Medication List        Accurate as of September 03, 2021 12:48 PM. If you have any questions, ask your nurse or doctor.          acetaminophen 500 MG tablet Commonly known as: TYLENOL Take 1,000 mg by mouth every 6 (six) hours as needed for moderate pain or headache.   Adagrasib 200 MG Tabs Take 400  mg by mouth 2 (two) times daily.   apixaban 5 MG Tabs tablet Commonly known as: ELIQUIS Take 5 mg by mouth 2 (two) times daily.   aspirin 81 MG tablet Take 81 mg by mouth at bedtime.   B-12 PO Take 1,000 mcg by mouth daily.   cholecalciferol 1000 units tablet Commonly known as: VITAMIN D Take 1,000 Units by mouth at bedtime.   diphenoxylate-atropine 2.5-0.025 MG tablet Commonly known as: LOMOTIL TAKE 2 TABLETS BY MOUTH AT ONSET OF DIARRHEA THEN TAKE 1 TABLET AFTER EACH LOOSE STOOL MAX 8 TABLETS PER DAY   Entresto 24-26 MG Generic drug: sacubitril-valsartan Take 1 tablet by mouth 2 (two) times daily.   ezetimibe 10 MG tablet Commonly known as: ZETIA Take 1 tablet (10 mg total) by mouth daily.   folic acid 1 MG tablet Commonly known as: FOLVITE TAKE ONE TABLET BY MOUTH DAILY START 5 TO 7 DAYS BEFORE ALIMTA CHEMO AND CONTINUE UNTIL 21 DAYS AFTER COMPLETED   metoprolol succinate 25 MG 24 hr tablet Commonly known as: TOPROL-XL Take 1 tablet (25 mg total) by mouth at bedtime.   nitroGLYCERIN 0.4 MG SL tablet Commonly known as: NITROSTAT Place 1 tablet (0.4 mg total) under the tongue every 5 (five) minutes as needed for  chest pain.   rosuvastatin 40 MG tablet Commonly known as: CRESTOR Take 1 tablet (40 mg total) by mouth at bedtime.   sertraline 50 MG tablet Commonly known as: ZOLOFT Take 50 mg by mouth in the morning and at bedtime.   spironolactone 25 MG tablet Commonly known as: ALDACTONE Take 1 tablet (25 mg total) by mouth daily.   traMADol 50 MG tablet Commonly known as: ULTRAM Take 1 tablet (50 mg total) by mouth every 6 (six) hours as needed.        Allergies:  Allergies  Allergen Reactions   Amlodipine Swelling    Swelling of ankles    Past Medical History, Surgical history, Social history, and Family History were reviewed and updated.  Review of Systems: Review of Systems  Constitutional: Negative.   HENT: Negative.    Eyes: Negative.    Respiratory: Negative.    Cardiovascular: Negative.   Gastrointestinal: Negative.   Genitourinary: Negative.   Musculoskeletal: Negative.   Skin: Negative.   Neurological: Negative.   Endo/Heme/Allergies: Negative.   Psychiatric/Behavioral: Negative.      Physical Exam:  oral temperature is 99 F (37.2 C). His blood pressure is 109/54 (abnormal) and his pulse is 68. His respiration is 18 and oxygen saturation is 99%.   Wt Readings from Last 3 Encounters:  08/26/21 180 lb 12 oz (82 kg)  07/31/21 188 lb (85.3 kg)  07/17/21 194 lb 1.3 oz (88 kg)    Physical Exam Vitals reviewed.  HENT:     Head: Normocephalic and atraumatic.  Eyes:     Pupils: Pupils are equal, round, and reactive to light.  Cardiovascular:     Rate and Rhythm: Normal rate and regular rhythm.     Heart sounds: Normal heart sounds.  Pulmonary:     Effort: Pulmonary effort is normal.     Breath sounds: Normal breath sounds.  Abdominal:     General: Bowel sounds are normal.     Palpations: Abdomen is soft.  Musculoskeletal:        General: No tenderness or deformity. Normal range of motion.     Cervical back: Normal range of motion.  Lymphadenopathy:     Cervical: No cervical adenopathy.  Skin:    General: Skin is warm and dry.     Findings: No erythema or rash.  Neurological:     Mental Status: He is alert and oriented to person, place, and time.  Psychiatric:        Behavior: Behavior normal.        Thought Content: Thought content normal.        Judgment: Judgment normal.    Lab Results  Component Value Date   WBC 13.9 (H) 09/03/2021   HGB 11.8 (L) 09/03/2021   HCT 35.8 (L) 09/03/2021   MCV 98.4 09/03/2021   PLT 238 09/03/2021   Lab Results  Component Value Date   FERRITIN 758 (H) 10/25/2020   IRON 69 10/25/2020   TIBC 321 10/25/2020   UIBC 252 10/25/2020   IRONPCTSAT 22 10/25/2020   Lab Results  Component Value Date   RBC 3.64 (L) 09/03/2021   No results found for: KPAFRELGTCHN,  LAMBDASER, KAPLAMBRATIO No results found for: IGGSERUM, IGA, IGMSERUM No results found for: Ronnald Ramp, A1GS, A2GS, Violet Baldy, MSPIKE, SPEI   Chemistry      Component Value Date/Time   NA 137 09/03/2021 0958   NA 142 06/19/2021 1032   K 3.5 09/03/2021 0958   CL 100  09/03/2021 0958   CO2 27 09/03/2021 0958   BUN 13 09/03/2021 0958   BUN 12 06/19/2021 1032   CREATININE 0.74 09/03/2021 0958   CREATININE 1.01 03/26/2016 1009      Component Value Date/Time   CALCIUM 9.0 09/03/2021 0958   ALKPHOS 432 (H) 09/03/2021 0958   AST 47 (H) 09/03/2021 0958   ALT 64 (H) 09/03/2021 0958   BILITOT 1.4 (H) 09/03/2021 0958       Impression and Plan: John Hinton is a very pleasant 76 yo caucasian male with an atypical stage IV lung cancer which was previously resected followed by systemic chemotherapy and maintenance pembrolizumab.   Again, I think he had toxicity from the [facility].  We held this for couple weeks.  Everything seems to have normalized for the most part.  We will restart the Lumakras.  We will start him at 360 mg p.o. daily.  He will let us know if he has problems with diarrhea.  I would like to see him back in 2 weeks.  Hopefully he will be doing well.   [name], MD 2/15/202312:48 PM

## 2021-09-03 NOTE — Progress Notes (Signed)
Patient here for labs and IVF. Lab results show overall improvement in liver function. Dr Marin Olp reviewed results and would like patient to have an MRI of the brain completed. Order placed. Will schedule once auth obtained.   Oncology Nurse Navigator Documentation  Oncology Nurse Navigator Flowsheets 09/03/2021  Abnormal Finding Date -  Confirmed Diagnosis Date -  Diagnosis Status -  Planned Course of Treatment -  Phase of Treatment -  Chemotherapy Actual Start Date: -  Chemotherapy Actual End Date: -  Surgery Actual Start Date: -  Navigator Follow Up Date: 09/04/2021  Navigator Follow Up Reason: Radiology  Navigator Location [facility]  Referral Date to RadOnc/MedOnc -  Navigator Encounter Type Treatment;Diagnostic Results  Telephone -  Treatment Initiated Date -  Patient Visit Type MedOnc  Treatment Phase Active Tx  Barriers/Navigation Needs Coordination of Care;Education  Education -  Interventions Coordination of Care;Psycho-Social Support  Acuity Level 2-Minimal Needs (1-2 Barriers Identified)  Coordination of Care Radiology  Education Method Verbal  Support Groups/Services Friends and Family  Time Spent with Patient 30

## 2021-09-03 NOTE — Patient Instructions (Signed)

## 2021-09-03 NOTE — Progress Notes (Signed)
Hematology and Oncology Follow Up Visit  Caston Coopersmith [id number] 1945/12/26 76 y.o. 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 -- (+) KRAS -- progression - metastasis to right lung, bilateral adrenal glands, bony metastasis to T1, Y 10 and left iliac bone PE involving the right lower lobe and right middle lobe branches, no right heart strain noted  Past Therapy: S/P cycle 6 of Carbo/Alimta/Pembrolizumab Alimta/pembrolizumab-maintenance therapy s/p cycle 3/4  - start on 11/28/2020 --Alimta discontinued after cycle 2   Current Therapy:        Lumakras 360 mg po q day -- started on 07/31/2021 -- d/c on 02/07/203 for toxicity Xgeva every 3 months - due again 08/2021 Eliquis 5 mg PO BID Adagrasib 400 mg po BID -- start on 09/24/2021   Interim History:  Mr. Gesner is here today for unscheduled visit.  He was in getting some lab work and fluids.  His wife said over the weekend, he did a little bit of excess alcohol intake during the [place].  Since then, he has been little confused.  He has not eaten much.  He has had no obvious problems with nausea or vomiting.  His diarrhea has resolved.  He is off the [place].  His liver function studies are much better.  I do worry about CNS metastasis.  I am going to have to get an MRI of the brain.  As possible that he just might be having issues with respect to the excess alcohol intake.  However, given the history with his malignancy, it is would not be a shock if he had CNS metastasis.  He has had no double vision.  He has no difficulties speaking.  He has had no incontinence.  There has been no fever.  He has had no bleeding.  He is on Eliquis.  Overall, I would say his performance status is ECOG 1.    Medications:  Allergies as of 09/03/2021       Reactions   Amlodipine Swelling   Swelling of ankles        Medication List        Accurate as of September 03, 2021 12:53 PM. If  you have any questions, ask your nurse or doctor.          acetaminophen 500 MG tablet Commonly known as: TYLENOL Take 1,000 mg by mouth every 6 (six) hours as needed for moderate pain or headache.   Adagrasib 200 MG Tabs Take 400 mg by mouth 2 (two) times daily.   apixaban 5 MG Tabs tablet Commonly known as: ELIQUIS Take 5 mg by mouth 2 (two) times daily.   aspirin 81 MG tablet Take 81 mg by mouth at bedtime.   B-12 PO Take 1,000 mcg by mouth daily.   cholecalciferol 1000 units tablet Commonly known as: VITAMIN D Take 1,000 Units by mouth at bedtime.   diphenoxylate-atropine 2.5-0.025 MG tablet Commonly known as: LOMOTIL TAKE 2 TABLETS BY MOUTH AT ONSET OF DIARRHEA THEN TAKE 1 TABLET AFTER EACH LOOSE STOOL MAX 8 TABLETS PER DAY   Entresto 24-26 MG Generic drug: sacubitril-valsartan Take 1 tablet by mouth 2 (two) times daily.   ezetimibe 10 MG tablet Commonly known as: ZETIA Take 1 tablet (10 mg total) by mouth daily.   folic acid 1 MG tablet Commonly known as: FOLVITE TAKE ONE TABLET BY MOUTH DAILY START 5 TO 7 DAYS BEFORE ALIMTA CHEMO AND CONTINUE UNTIL 21 DAYS AFTER COMPLETED  metoprolol succinate 25 MG 24 hr tablet Commonly known as: TOPROL-XL Take 1 tablet (25 mg total) by mouth at bedtime.   nitroGLYCERIN 0.4 MG SL tablet Commonly known as: NITROSTAT Place 1 tablet (0.4 mg total) under the tongue every 5 (five) minutes as needed for chest pain.   rosuvastatin 40 MG tablet Commonly known as: CRESTOR Take 1 tablet (40 mg total) by mouth at bedtime.   sertraline 50 MG tablet Commonly known as: ZOLOFT Take 50 mg by mouth in the morning and at bedtime.   spironolactone 25 MG tablet Commonly known as: ALDACTONE Take 1 tablet (25 mg total) by mouth daily.   traMADol 50 MG tablet Commonly known as: ULTRAM Take 1 tablet (50 mg total) by mouth every 6 (six) hours as needed.        Allergies:  Allergies  Allergen Reactions   Amlodipine Swelling     Swelling of ankles    Past Medical History, Surgical history, Social history, and Family History were reviewed and updated.  Review of Systems: Review of Systems  Constitutional: Negative.   HENT: Negative.    Eyes: Negative.   Respiratory: Negative.    Cardiovascular: Negative.   Gastrointestinal: Negative.   Genitourinary: Negative.   Musculoskeletal: Negative.   Skin: Negative.   Neurological: Negative.   Endo/Heme/Allergies: Negative.   Psychiatric/Behavioral: Negative.      Physical Exam:  oral temperature is 99 F (37.2 C). His blood pressure is 109/54 (abnormal) and his pulse is 68. His respiration is 18 and oxygen saturation is 99%.   Wt Readings from Last 3 Encounters:  08/26/21 180 lb 12 oz (82 kg)  07/31/21 188 lb (85.3 kg)  07/17/21 194 lb 1.3 oz (88 kg)    Physical Exam Vitals reviewed.  HENT:     Head: Normocephalic and atraumatic.  Eyes:     Pupils: Pupils are equal, round, and reactive to light.  Cardiovascular:     Rate and Rhythm: Normal rate and regular rhythm.     Heart sounds: Normal heart sounds.  Pulmonary:     Effort: Pulmonary effort is normal.     Breath sounds: Normal breath sounds.  Abdominal:     General: Bowel sounds are normal.     Palpations: Abdomen is soft.  Musculoskeletal:        General: No tenderness or deformity. Normal range of motion.     Cervical back: Normal range of motion.  Lymphadenopathy:     Cervical: No cervical adenopathy.  Skin:    General: Skin is warm and dry.     Findings: No erythema or rash.  Neurological:     Mental Status: He is alert and oriented to person, place, and time.  Psychiatric:        Behavior: Behavior normal.        Thought Content: Thought content normal.        Judgment: Judgment normal.    Lab Results  Component Value Date   WBC 13.9 (H) 09/03/2021   HGB 11.8 (L) 09/03/2021   HCT 35.8 (L) 09/03/2021   MCV 98.4 09/03/2021   PLT 238 09/03/2021   Lab Results  Component Value  Date   FERRITIN 758 (H) 10/25/2020   IRON 69 10/25/2020   TIBC 321 10/25/2020   UIBC 252 10/25/2020   IRONPCTSAT 22 10/25/2020   Lab Results  Component Value Date   RBC 3.64 (L) 09/03/2021   No results found for: KPAFRELGTCHN, LAMBDASER, KAPLAMBRATIO No results found for: IGGSERUM,  IGA, IGMSERUM No results found for: Odetta Pink, SPEI   Chemistry      Component Value Date/Time   NA 137 09/03/2021 0958   NA 142 06/19/2021 1032   K 3.5 09/03/2021 0958   CL 100 09/03/2021 0958   CO2 27 09/03/2021 0958   BUN 13 09/03/2021 0958   BUN 12 06/19/2021 1032   CREATININE 0.74 09/03/2021 0958   CREATININE 1.01 03/26/2016 1009      Component Value Date/Time   CALCIUM 9.0 09/03/2021 0958   ALKPHOS 432 (H) 09/03/2021 0958   AST 47 (H) 09/03/2021 0958   ALT 64 (H) 09/03/2021 0958   BILITOT 1.4 (H) 09/03/2021 0958       Impression and Plan: Mr. Mirarchi is a very pleasant 76 yo caucasian male with an atypical stage IV lung cancer which was previously resected followed by systemic chemotherapy and maintenance pembrolizumab.   He was on Lumakras but cannot tolerate this because of the elevated LFTs and the diarrhea.  He received the adagrasib.  He is not going to start this until after he gets back from [place] which would be an early March.  Again we will see about MRI of the brain just to be on the safe side.  He certainly looked okay.  He really was not all that confused.  Maybe the IV fluids helped.  His liver function studies looked a whole lot better.  He was not hypercalcemic.  He was not hyponatremic.    [name], MD 2/15/202312:53 PM

## 2021-09-04 ENCOUNTER — Encounter: Payer: Self-pay | Admitting: *Deleted

## 2021-09-04 NOTE — Progress Notes (Signed)
[name], MD  P Onc Nurse Hp Call -- the brain looks ok!!!  No tumors!!  [employer] and spoke to patient's wife, Pamala Hurry. She is aware of results. She felt like patient was slightly clearer after his fluids yesterday.   Oncology Nurse Navigator Documentation  Oncology Nurse Navigator Flowsheets 09/04/2021  Abnormal Finding Date -  Confirmed Diagnosis Date -  Diagnosis Status -  Planned Course of Treatment -  Phase of Treatment -  Chemotherapy Actual Start Date: -  Chemotherapy Actual End Date: -  Surgery Actual Start Date: -  Navigator Follow Up Date: 09/24/2021  Navigator Follow Up Reason: Follow-up Appointment  Navigator Location [facility]  Referral Date to RadOnc/MedOnc -  Navigator Encounter Type Telephone  Telephone Diagnostic Results  Treatment Initiated Date -  Patient Visit Type MedOnc  Treatment Phase Active Tx  Barriers/Navigation Needs Coordination of Care;Education  Education Other  Interventions Education;Psycho-Social Support  Acuity Level 2-Minimal Needs (1-2 Barriers Identified)  Coordination of Care -  Education Method Verbal  Support Groups/Services Friends and Family  Time Spent with Patient 15

## 2021-09-06 ENCOUNTER — Other Ambulatory Visit (HOSPITAL_BASED_OUTPATIENT_CLINIC_OR_DEPARTMENT_OTHER): Payer: Medicare Other

## 2021-09-07 DIAGNOSIS — E7800 Pure hypercholesterolemia, unspecified: Secondary | ICD-10-CM | POA: Diagnosis not present

## 2021-09-07 DIAGNOSIS — I10 Essential (primary) hypertension: Secondary | ICD-10-CM | POA: Diagnosis not present

## 2021-09-09 ENCOUNTER — Other Ambulatory Visit: Payer: Self-pay | Admitting: Hematology & Oncology

## 2021-09-09 ENCOUNTER — Encounter: Payer: Self-pay | Admitting: *Deleted

## 2021-09-09 MED ORDER — OXYCODONE-ACETAMINOPHEN 7.5-325 MG PO TABS
1.0000 | ORAL_TABLET | ORAL | 0 refills | Status: DC | PRN
Start: 2021-09-09 — End: 2022-09-25

## 2021-09-09 NOTE — Progress Notes (Signed)
Patient's wife, Pamala Hurry, calling notifying office that patient has had increased back pain over the last few days. It is getting bad enough that he believes he will need to cancel his upcoming trip to [place]. He has been taking Ultram 50mg  every 6 hours and this isn't helping at all. Pamala Hurry wants to know if something stronger can be prescribed that would help allow him to travel to [place].   Spoke to Dr Marin Olp and he will send in prescription for percocet.   Notified Pamala Hurry of new prescription. Instructed her to give a few doses today to see how well it treats his pain, but to also determine if there will be any side effects from the stronger medication. She agrees.   Oncology Nurse Navigator Documentation  Oncology Nurse Navigator Flowsheets 09/09/2021  Abnormal Finding Date -  Confirmed Diagnosis Date -  Diagnosis Status -  Planned Course of Treatment -  Phase of Treatment -  Chemotherapy Actual Start Date: -  Chemotherapy Actual End Date: -  Surgery Actual Start Date: -  Navigator Follow Up Date: 09/24/2021  Navigator Follow Up Reason: Follow-up Appointment  Navigator Location [facility]  Referral Date to RadOnc/MedOnc -  Navigator Encounter Type Treatment  Telephone Symptom Mgt;Incoming Call  Treatment Initiated Date -  Patient Visit Type MedOnc  Treatment Phase Active Tx  Barriers/Navigation Needs Coordination of Care;Education  Education Other  Interventions Education;Medication Assistance;Psycho-Social Support  Acuity Level 2-Minimal Needs (1-2 Barriers Identified)  Coordination of Care -  Education Method Verbal  Support Groups/Services Friends and Family  Time Spent with Patient 30

## 2021-09-21 ENCOUNTER — Encounter: Payer: Self-pay | Admitting: *Deleted

## 2021-09-22 ENCOUNTER — Other Ambulatory Visit: Payer: Self-pay

## 2021-09-22 ENCOUNTER — Other Ambulatory Visit: Payer: Self-pay | Admitting: *Deleted

## 2021-09-22 ENCOUNTER — Encounter (HOSPITAL_COMMUNITY)
Admission: RE | Admit: 2021-09-22 | Discharge: 2021-09-22 | Disposition: A | Discharge status: Home / Self Care | Payer: Medicare Other | Source: Ambulatory Visit | Attending: Hematology & Oncology | Admitting: Hematology & Oncology

## 2021-09-22 DIAGNOSIS — N200 Calculus of kidney: Secondary | ICD-10-CM | POA: Diagnosis not present

## 2021-09-22 DIAGNOSIS — C3492 Malignant neoplasm of unspecified part of left bronchus or lung: Secondary | ICD-10-CM | POA: Diagnosis not present

## 2021-09-22 DIAGNOSIS — C7951 Secondary malignant neoplasm of bone: Secondary | ICD-10-CM | POA: Insufficient documentation

## 2021-09-22 DIAGNOSIS — I700 Atherosclerosis of aorta: Secondary | ICD-10-CM | POA: Insufficient documentation

## 2021-09-22 DIAGNOSIS — C7970 Secondary malignant neoplasm of unspecified adrenal gland: Secondary | ICD-10-CM | POA: Diagnosis not present

## 2021-09-22 DIAGNOSIS — Z85819 Personal history of malignant neoplasm of unspecified site of lip, oral cavity, and pharynx: Secondary | ICD-10-CM | POA: Diagnosis not present

## 2021-09-22 DIAGNOSIS — C3490 Malignant neoplasm of unspecified part of unspecified bronchus or lung: Secondary | ICD-10-CM | POA: Diagnosis not present

## 2021-09-22 LAB — GLUCOSE, CAPILLARY: Glucose-Capillary: 94 mg/dL (ref 70–99)

## 2021-09-22 IMAGING — CT NM PET TUM IMG RESTAG (PS) SKULL BASE T - THIGH
1 of 9 series · 1 of 25 positions shown · non-contrast
Comparison: [DATE] and CT chest [DATE].

CLINICAL DATA: Subsequent treatment strategy for small cell lung
cancer, on oral chemotherapy.

EXAM:
NUCLEAR MEDICINE PET SKULL BASE TO THIGH
TECHNIQUE: 8.5 mCi F-18 FDG was injected intravenously. Full-ring PET imaging
was performed from the skull base to thigh after the radiotracer. CT
data was obtained and used for attenuation correction and anatomic
localization.
Fasting blood glucose: 94 mg/dl

[Series 4: ct sk_thigh 5.0 bf37 · axial · 5.0mm · 0.92mm/px · 1 of 244 slices shown]
[im 244/244  brain]
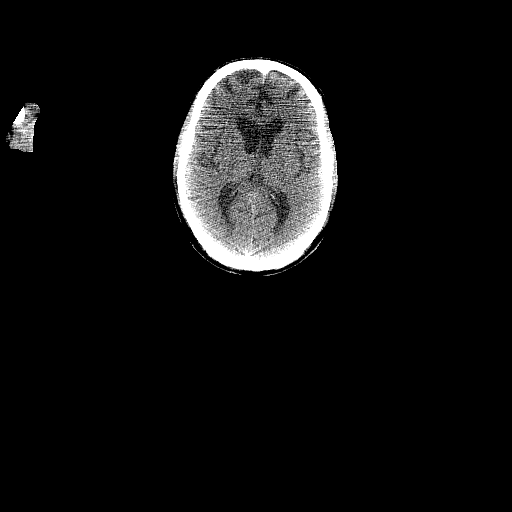

[1 of 25 positions shown; findings below may reference images not displayed]

FINDINGS: Mediastinal blood pool activity: SUV max

Liver activity: SUV max NA

NECK:

Left roof of mouth asymmetric hypermetabolism without a definite CT
correlate. No hypermetabolic lymph nodes.

Incidental CT findings:

None.

CHEST:

No hypermetabolic mediastinal, hilar or axillary nodes. No
hypermetabolic pulmonary nodules.

Incidental CT findings:

Atherosclerotic calcification of the aorta and aortic valve.
Enlarged pulmonic trunk and heart. No pericardial effusion.
Calcified mediastinal and hilar lymph nodes. Calcified granulomas.
Pleuroparenchymal scarring bilaterally, worst in the left lower
lobe. Left upper lobectomy.

ABDOMEN/PELVIS:

Hypermetabolic right adrenal mass is enlarged in the interval, now
measuring 5.7 x 6.5 cm and SUV max 17.6, compared to 4.0 x 5.4 cm
and SUV max 23.6. Left adrenal mass is increased in size
significantly, now measuring 3.3 x 4.7 cm, SUV max 23.3, compared to
1.3 cm and SUV max 3.5. No abnormal hypermetabolism in the liver,
spleen or pancreas. 1.4 cm portacaval lymph node (4/122), SUV max
3.3 previously 5.2.

Incidental CT findings:

1.5 cm fluid density lesion in the left hepatic lobe, likely a cyst.
Liver and gallbladder are otherwise unremarkable. Subcentimeter
low-attenuation lesions in the kidneys are too small to
characterize. 2.4 cm fluid density lesion in the left kidney, likely
a cyst. Tiny stone in the right kidney. Spleen, pancreas, stomach
and bowel are otherwise grossly unremarkable. Atherosclerotic
calcification of the aorta.

SKELETON:

Increasingly hypermetabolic lesions in the T1 and T11 vertebral
bodies as well as iliac wing. Index lesion in the left iliac wing
has an SUV max 9.8, compared to 4.9 previously.

Incidental CT findings:

Degenerative changes in the spine.
IMPRESSION: 1. Enlarging hypermetabolic adrenal metastases.
2. Increasingly hypermetabolic T1, T11 and left iliac wing
metastases, worrisome for disease progression. Post treatment
healing can also have this effect.
3. Tiny right renal stone.
4.  Aortic atherosclerosis ([PB]-[PB]).
5. Enlarged pulmonic trunk, indicative of pulmonary arterial
hypertension.

## 2021-09-22 MED ORDER — TRAMADOL HCL 50 MG PO TABS: 50.0000 mg | ORAL_TABLET | Freq: Four times a day (QID) | ORAL | 0 refills | Status: DC | PRN

## 2021-09-22 MED ORDER — FLUDEOXYGLUCOSE F - 18 (FDG) INJECTION
9.0000 | Freq: Once | INTRAVENOUS | Status: AC | PRN
  Administered 2021-09-22: 8.48 via INTRAVENOUS

## 2021-09-23 ENCOUNTER — Encounter: Payer: Self-pay | Admitting: *Deleted

## 2021-09-23 ENCOUNTER — Telehealth: Payer: Self-pay

## 2021-09-23 NOTE — Progress Notes (Signed)
John Napoleon, MD  P Onc Nurse Hp ?Allergic message for him on his mobile phone.  I gave him a message about the PET scan that it did look worse.  There is nothing new but what he had is larger and more active.  I did tell him to start the [place].  There will be 400 mg p.o. twice daily.  I told him to call my nurse tomorrow and I gave him the phone number [phone]) to let me know that he got the message.  ? ?Laurey Arrow  ? ?Oncology Nurse Navigator Documentation ? ?Oncology Nurse Navigator Flowsheets 09/23/2021  ?Abnormal Finding Date -  ?Confirmed Diagnosis Date -  ?Diagnosis Status -  ?Planned Course of Treatment -  ?Phase of Treatment -  ?Chemotherapy Actual Start Date: -  ?Chemotherapy Actual End Date: -  ?Surgery Actual Start Date: -  ?Navigator Follow Up Date: 09/24/2021  ?Navigator Follow Up Reason: Follow-up Appointment  ?Navigator Location [facility]  ?Referral Date to RadOnc/MedOnc -  ?Navigator Encounter Type Scan Review  ?Telephone -  ?Treatment Initiated Date -  ?Patient Visit Type MedOnc  ?Treatment Phase Active Tx  ?Barriers/Navigation Needs Coordination of Care;Education  ?Education -  ?Interventions None Required  ?Acuity Level 2-Minimal Needs (1-2 Barriers Identified)  ?Coordination of Care -  ?Education Method -  ?Support Groups/Services Friends and Family  ?Time Spent with Patient 15  ?  ?

## 2021-09-23 NOTE — Telephone Encounter (Signed)
Oral Chemotherapy Pharmacist Encounter ?  ?I spoke with patient for overview of new oral chemotherapy medication: [name] (adagrasib) for the treatment of metastatic non-small cell lung cancer, KRAS G12C-mutated, planned duration until disease progression or unacceptable drug toxicity. ?  ?Pt is doing well. Counseled patient on administration, dosing, side effects, monitoring, drug-food interactions, safe handling, storage, and disposal. ?  ?Patient will take Krazati 200 mg tablets, 2 tablets (400 mg total) by mouth 2 (two) times daily. Recommended patient take [name] with food to decrease GI upset. ?  ?Start date: 09/23/21  ?  ?Side effects include but are not limited to: diarrhea, nausea/vomiting, hepatotoxicity, edema, fatigue, decrease in blood counts. Also reviewed rare but serious side effect of Qtc prolongation as well as pneumonitis that have been reported with use of medication.  ? ?Patient has anti-emetic on hand and knows to take it if nausea develops.   ?Patient will obtain anti diarrheal and alert the office of 4 or more loose stools above baseline.  ?  ?Reviewed with patient importance of keeping a medication schedule and plan for any missed doses. Discussed drug-drug interactions with patient as previously noted in note from 08/26/21. ?  ?After discussion with patient no patient barriers to medication adherence identified.  ?  ?Ship broker for [name] has been obtained. First fill obtained through free trial through manufacturer. Subsequent fills will be through [facility].  ? ?All questions answered. ? ?Mr. Rigor voiced understanding and appreciation.  ? ?Medication education handout placed in mail for patient. Patient knows to call the office with questions or concerns. Oral Chemotherapy Clinic phone number provided to patient. ? ?Leron Croak, PharmD, BCPS ?Hematology/Oncology Clinical Pharmacist ?[facility] ?[phone] ?09/23/2021 9:38 AM ? ?

## 2021-09-23 NOTE — Telephone Encounter (Signed)
Per Dr Marin Olp:  ? ?message for him on his mobile phone.  I gave him a message about the PET scan that it did look worse.  There is nothing new but what he had is larger and more active.  I did tell him to start the [place].  There will be 400 mg p.o. twice daily.  I told him to call my nurse tomorrow and I gave him the phone number [phone]) to let me know that he got the message.  ? ?Laurey Arrow  ?

## 2021-09-24 ENCOUNTER — Inpatient Hospital Stay: Payer: Medicare Other

## 2021-09-24 ENCOUNTER — Inpatient Hospital Stay (HOSPITAL_BASED_OUTPATIENT_CLINIC_OR_DEPARTMENT_OTHER): Payer: Medicare Other | Admitting: Hematology & Oncology

## 2021-09-24 ENCOUNTER — Other Ambulatory Visit: Payer: Self-pay

## 2021-09-24 ENCOUNTER — Inpatient Hospital Stay: Payer: Medicare Other | Attending: Hematology & Oncology

## 2021-09-24 ENCOUNTER — Encounter: Payer: Self-pay | Admitting: Hematology & Oncology

## 2021-09-24 VITALS — BP 100/64 | HR 82 | Temp 97.6°F | Resp 18 | Ht 71.0 in | Wt 173.1 lb

## 2021-09-24 DIAGNOSIS — C3412 Malignant neoplasm of upper lobe, left bronchus or lung: Secondary | ICD-10-CM | POA: Diagnosis not present

## 2021-09-24 DIAGNOSIS — C3492 Malignant neoplasm of unspecified part of left bronchus or lung: Secondary | ICD-10-CM

## 2021-09-24 DIAGNOSIS — C7951 Secondary malignant neoplasm of bone: Secondary | ICD-10-CM | POA: Insufficient documentation

## 2021-09-24 LAB — CBC WITH DIFFERENTIAL (CANCER CENTER ONLY)
Abs Immature Granulocytes: 0.05 10*3/uL (ref 0.00–0.07)
Basophils Absolute: 0 10*3/uL (ref 0.0–0.1)
Basophils Relative: 0 %
Eosinophils Absolute: 0.1 10*3/uL (ref 0.0–0.5)
Eosinophils Relative: 1 %
HCT: 39 % (ref 39.0–52.0)
Hemoglobin: 12.9 g/dL — ABNORMAL LOW (ref 13.0–17.0)
Immature Granulocytes: 0 %
Lymphocytes Relative: 11 %
Lymphs Abs: 1.4 10*3/uL (ref 0.7–4.0)
MCH: 31.7 pg (ref 26.0–34.0)
MCHC: 33.1 g/dL (ref 30.0–36.0)
MCV: 95.8 fL (ref 80.0–100.0)
Monocytes Absolute: 0.7 10*3/uL (ref 0.1–1.0)
Monocytes Relative: 6 %
Neutro Abs: 10 10*3/uL — ABNORMAL HIGH (ref 1.7–7.7)
Neutrophils Relative %: 82 %
Platelet Count: 255 10*3/uL (ref 150–400)
RBC: 4.07 MIL/uL — ABNORMAL LOW (ref 4.22–5.81)
RDW: 13.8 % (ref 11.5–15.5)
WBC Count: 12.3 10*3/uL — ABNORMAL HIGH (ref 4.0–10.5)
nRBC: 0 % (ref 0.0–0.2)

## 2021-09-24 LAB — CMP (CANCER CENTER ONLY)
ALT: 21 U/L (ref 0–44)
AST: 22 U/L (ref 15–41)
Albumin: 4.1 g/dL (ref 3.5–5.0)
Alkaline Phosphatase: 207 U/L — ABNORMAL HIGH (ref 38–126)
Anion gap: 9 (ref 5–15)
BUN: 17 mg/dL (ref 8–23)
CO2: 27 mmol/L (ref 22–32)
Calcium: 9.3 mg/dL (ref 8.9–10.3)
Chloride: 101 mmol/L (ref 98–111)
Creatinine: 0.98 mg/dL (ref 0.61–1.24)
GFR, Estimated: >60 mL/min (ref >60)
Glucose, Bld: 125 mg/dL — ABNORMAL HIGH (ref 70–99)
Potassium: 4.4 mmol/L (ref 3.5–5.1)
Sodium: 137 mmol/L (ref 135–145)
Total Bilirubin: 0.6 mg/dL (ref 0.3–1.2)
Total Protein: 7.6 g/dL (ref 6.5–8.1)

## 2021-09-24 LAB — LACTATE DEHYDROGENASE: LDH: 247 U/L — ABNORMAL HIGH (ref 98–192)

## 2021-09-24 MED ORDER — DENOSUMAB 120 MG/1.7ML ~~LOC~~ SOLN
120.0000 mg | Freq: Once | SUBCUTANEOUS | Status: AC
  Administered 2021-09-24: 120 mg via SUBCUTANEOUS
  Filled 2021-09-24: qty 1.7

## 2021-09-24 MED ORDER — DRONABINOL 5 MG PO CAPS: 5.0000 mg | ORAL_CAPSULE | Freq: Two times a day (BID) | ORAL | 0 refills | Status: DC

## 2021-09-24 NOTE — Patient Instructions (Signed)
Denosumab injection ?What is this medication? ?DENOSUMAB (den oh sue mab) slows bone breakdown. Prolia is used to treat osteoporosis in women after menopause and in men, and in people who are taking corticosteroids for 6 months or more. Xgeva is used to treat a high calcium level due to cancer and to prevent bone fractures and other bone problems caused by multiple myeloma or cancer bone metastases. Xgeva is also used to treat giant cell tumor of the bone. ?This medicine may be used for other purposes; ask your health care provider or pharmacist if you have questions. ?COMMON BRAND NAME(S): Prolia, XGEVA ?What should I tell my care team before I take this medication? ?They need to know if you have any of these conditions: ?dental disease ?having surgery or tooth extraction ?infection ?kidney disease ?low levels of calcium or Vitamin D in the blood ?malnutrition ?on hemodialysis ?skin conditions or sensitivity ?thyroid or parathyroid disease ?an unusual reaction to denosumab, other medicines, foods, dyes, or preservatives ?pregnant or trying to get pregnant ?breast-feeding ?How should I use this medication? ?This medicine is for injection under the skin. It is given by a health care professional in a hospital or clinic setting. ?A special MedGuide will be given to you before each treatment. Be sure to read this information carefully each time. ?For Prolia, talk to your pediatrician regarding the use of this medicine in children. Special care may be needed. For Xgeva, talk to your pediatrician regarding the use of this medicine in children. While this drug may be prescribed for children as young as 13 years for selected conditions, precautions do apply. ?Overdosage: If you think you have taken too much of this medicine contact a poison control center or emergency room at once. ?NOTE: This medicine is only for you. Do not share this medicine with others. ?What if I miss a dose? ?It is important not to miss your dose.  Call your doctor or health care professional if you are unable to keep an appointment. ?What may interact with this medication? ?Do not take this medicine with any of the following medications: ?other medicines containing denosumab ?This medicine may also interact with the following medications: ?medicines that lower your chance of fighting infection ?steroid medicines like prednisone or cortisone ?This list may not describe all possible interactions. Give your health care provider a list of all the medicines, herbs, non-prescription drugs, or dietary supplements you use. Also tell them if you smoke, drink alcohol, or use illegal drugs. Some items may interact with your medicine. ?What should I watch for while using this medication? ?Visit your doctor or health care professional for regular checks on your progress. Your doctor or health care professional may order blood tests and other tests to see how you are doing. ?Call your doctor or health care professional for advice if you get a fever, chills or sore throat, or other symptoms of a cold or flu. Do not treat yourself. This drug may decrease your body's ability to fight infection. Try to avoid being around people who are sick. ?You should make sure you get enough calcium and vitamin D while you are taking this medicine, unless your doctor tells you not to. Discuss the foods you eat and the vitamins you take with your health care professional. ?See your dentist regularly. Brush and floss your teeth as directed. Before you have any dental work done, tell your dentist you are receiving this medicine. ?Do not become pregnant while taking this medicine or for 5 months after   stopping it. Talk with your doctor or health care professional about your birth control options while taking this medicine. Women should inform their doctor if they wish to become pregnant or think they might be pregnant. There is a potential for serious side effects to an unborn child. Talk to  your health care professional or pharmacist for more information. ?What side effects may I notice from receiving this medication? ?Side effects that you should report to your doctor or health care professional as soon as possible: ?allergic reactions like skin rash, itching or hives, swelling of the face, lips, or tongue ?bone pain ?breathing problems ?dizziness ?jaw pain, especially after dental work ?redness, blistering, peeling of the skin ?signs and symptoms of infection like fever or chills; cough; sore throat; pain or trouble passing urine ?signs of low calcium like fast heartbeat, muscle cramps or muscle pain; pain, tingling, numbness in the hands or feet; seizures ?unusual bleeding or bruising ?unusually weak or tired ?Side effects that usually do not require medical attention (report to your doctor or health care professional if they continue or are bothersome): ?constipation ?diarrhea ?headache ?joint pain ?loss of appetite ?muscle pain ?runny nose ?tiredness ?upset stomach ?This list may not describe all possible side effects. Call your doctor for medical advice about side effects. You may report side effects to FDA at 1-800-FDA-1088. ?Where should I keep my medication? ?This medicine is only given in a clinic, doctor's office, or other health care setting and will not be stored at home. ?NOTE: This sheet is a summary. It may not cover all possible information. If you have questions about this medicine, talk to your doctor, pharmacist, or health care provider. ?? 2022 Elsevier/Gold Standard (2017-11-12 00:00:00) ? ?

## 2021-09-24 NOTE — Progress Notes (Signed)
?Hematology and Oncology Follow Up Visit ? ?Jhamal Plucinski ?[id number] ?19-Feb-1946 76 y.o. ?09/24/2021 ? ? 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 ?Past Therapy: ?S/P cycle 6 of Carbo/Alimta/Pembrolizumab ?Alimta/pembrolizumab-maintenance therapy s/p cycle 3/4  - start on 11/28/2020 --Alimta discontinued after cycle 2 ?  ?Current Therapy:        ?Lumakras 360 mg po q day -- started on 07/31/2021 -- d/c on 02/07/203 for toxicity ?Xgeva every 3 months - due again 12/2021 ?Eliquis 5 mg PO BID ?Adagrasib 400 mg po BID -- start on 09/22/2021 ?  ?Interim History:  Mr. Full is here today for follow-up.  He comes in with his wife.  That she did have a nice time out in [place].  He really was not all that hungry.  He was having a little bit of pain issues. ? ?We did do a PET scan on him.  This was done last week.  The PET scan did show that he did have some uptake in T1 and T11.  He also adrenal metastasis. ? ?I will go ahead and see about getting referred to Radiation Oncology.  Hopefully they can help with his back pain. ? ?He started the adagrasib a couple days ago.  Hopefully, it would not affect his liver.  He says he has had little bit of diarrhea.  I told him that he has to take the Lomotil every time he takes the adagrasib. ? ?He is losing weight.  He is not eating.  I will try him on some Marinol to see if this may help.  We will try him on 5 mg p.o. twice daily. ? ?He says he not urinating as well.  He is still urinating. ? ?He has had no cough.  He has had no mouth sores.  Says some things taste salty. ? ?He still has little bit of a headache.  We did do an MRI on him previously which showed no evidence of CNS metastasis. ? ?Overall, I would say his  performance status is probably ECOG 2.    ? ?Medications:  ?Allergies as of 09/24/2021   ? ?   Reactions  ? Amlodipine Swelling  ? Swelling of ankles  ? ?  ? ?  ?Medication List  ?  ? ?  ? Accurate as of September 24, 2021  4:01 PM. If you have any questions, ask your nurse or doctor.  ?  ?  ? ?  ? ?Adagrasib 200 MG Tabs ?Take 400 mg by mouth 2 (two) times daily. ?  ?apixaban 5 MG Tabs tablet ?Commonly known as: ELIQUIS ?Take 5 mg by mouth 2 (two) times daily. ?  ?aspirin 81 MG tablet ?Take 81 mg by mouth at bedtime. ?  ?B-12 PO ?Take 1,000 mcg by mouth daily. ?  ?cholecalciferol 1000 units tablet ?Commonly known as: VITAMIN D ?Take 1,000 Units by mouth at bedtime. ?  ?diphenoxylate-atropine 2.5-0.025 MG tablet ?Commonly known as: LOMOTIL ?TAKE 2 TABLETS BY MOUTH AT ONSET OF DIARRHEA THEN TAKE 1 TABLET AFTER EACH LOOSE STOOL MAX 8 TABLETS PER DAY ?  ?Entresto 24-26 MG ?Generic drug: sacubitril-valsartan ?Take 1 tablet by mouth 2 (two) times daily. ?  ?ezetimibe 10 MG tablet ?Commonly known as: ZETIA ?Take 1 tablet (  10 mg total) by mouth daily. ?  ?folic acid 1 MG tablet ?Commonly known as: FOLVITE ?TAKE ONE TABLET BY MOUTH DAILY START 5 TO 7 DAYS BEFORE ALIMTA CHEMO AND CONTINUE UNTIL 21 DAYS AFTER COMPLETED ?  ?metoprolol succinate 25 MG 24 hr tablet ?Commonly known as: TOPROL-XL ?Take 1 tablet (25 mg total) by mouth at bedtime. ?  ?nitroGLYCERIN 0.4 MG SL tablet ?Commonly known as: NITROSTAT ?Place 1 tablet (0.4 mg total) under the tongue every 5 (five) minutes as needed for chest pain. ?  ?oxyCODONE-acetaminophen 7.5-325 MG tablet ?Commonly known as: Percocet ?Take 1 tablet by mouth every 4 (four) hours as needed for severe pain. ?  ?rosuvastatin 40 MG tablet ?Commonly known as: CRESTOR ?Take 1 tablet (40 mg total) by mouth at bedtime. ?  ?sertraline 50 MG tablet ?Commonly known as: ZOLOFT ?Take 50 mg by mouth in the morning and at bedtime. ?  ?spironolactone 25 MG tablet ?Commonly known as: ALDACTONE ?Take 1 tablet (25  mg total) by mouth daily. ?  ?traMADol 50 MG tablet ?Commonly known as: ULTRAM ?Take 1 tablet (50 mg total) by mouth every 6 (six) hours as needed. ?  ? ?  ? ? ?Allergies:  ?Allergies  ?Allergen Reactions  ? Amlodipine Swelling  ?  Swelling of ankles  ? ? ?Past Medical History, Surgical history, Social history, and Family History were reviewed and updated. ? ?Review of Systems: ?Review of Systems  ?Constitutional: Negative.   ?HENT: Negative.    ?Eyes: Negative.   ?Respiratory: Negative.    ?Cardiovascular: Negative.   ?Gastrointestinal: Negative.   ?Genitourinary: Negative.   ?Musculoskeletal: Negative.   ?Skin: Negative.   ?Neurological: Negative.   ?Endo/Heme/Allergies: Negative.   ?Psychiatric/Behavioral: Negative.    ? ? ?Physical Exam: ? height is '5\' 11"'  (1.803 m) and weight is 173 lb 1.9 oz (78.5 kg). His oral temperature is 97.6 ?F (36.4 ?C). His blood pressure is 100/64 and his pulse is 82. His respiration is 18 and oxygen saturation is 100%.  ? ?Wt Readings from Last 3 Encounters:  ?09/24/21 173 lb 1.9 oz (78.5 kg)  ?08/26/21 180 lb 12 oz (82 kg)  ?07/31/21 188 lb (85.3 kg)  ? ? ?Physical Exam ?Vitals reviewed.  ?HENT:  ?   Head: Normocephalic and atraumatic.  ?Eyes:  ?   Pupils: Pupils are equal, round, and reactive to light.  ?Cardiovascular:  ?   Rate and Rhythm: Normal rate and regular rhythm.  ?   Heart sounds: Normal heart sounds.  ?Pulmonary:  ?   Effort: Pulmonary effort is normal.  ?   Breath sounds: Normal breath sounds.  ?Abdominal:  ?   General: Bowel sounds are normal.  ?   Palpations: Abdomen is soft.  ?Musculoskeletal:     ?   General: No tenderness or deformity. Normal range of motion.  ?   Cervical back: Normal range of motion.  ?Lymphadenopathy:  ?   Cervical: No cervical adenopathy.  ?Skin: ?   General: Skin is warm and dry.  ?   Findings: No erythema or rash.  ?Neurological:  ?   Mental Status: He is alert and oriented to person, place, and time.  ?Psychiatric:     ?   Behavior:  Behavior normal.     ?   Thought Content: Thought content normal.     ?   Judgment: Judgment normal.  ? ? ?Lab Results  ?Component Value Date  ? WBC 12.3 (H) 09/24/2021  ? HGB 12.9 (L)  09/24/2021  ? HCT 39.0 09/24/2021  ? MCV 95.8 09/24/2021  ? PLT 255 09/24/2021  ? ?Lab Results  ?Component Value Date  ? FERRITIN 758 (H) 10/25/2020  ? IRON 69 10/25/2020  ? TIBC 321 10/25/2020  ? UIBC 252 10/25/2020  ? IRONPCTSAT 22 10/25/2020  ? ?Lab Results  ?Component Value Date  ? RBC 4.07 (L) 09/24/2021  ? ?No results found for: KPAFRELGTCHN, LAMBDASER, KAPLAMBRATIO ?No results found for: IGGSERUM, IGA, IGMSERUM ?No results found for: TOTALPROTELP, ALBUMINELP, A1GS, A2GS, BETS, BETA2SER, GAMS, MSPIKE, SPEI ?  Chemistry   ?   ?Component Value Date/Time  ? NA 137 09/24/2021 1511  ? NA 142 06/19/2021 1032  ? K 4.4 09/24/2021 1511  ? CL 101 09/24/2021 1511  ? CO2 27 09/24/2021 1511  ? BUN 17 09/24/2021 1511  ? BUN 12 06/19/2021 1032  ? CREATININE 0.98 09/24/2021 1511  ? CREATININE 1.01 03/26/2016 1009  ?    ?Component Value Date/Time  ? CALCIUM 9.3 09/24/2021 1511  ? ALKPHOS 207 (H) 09/24/2021 1511  ? AST 22 09/24/2021 1511  ? ALT 21 09/24/2021 1511  ? BILITOT 0.6 09/24/2021 1511  ?  ? ? ? ?Impression and Plan: Mr. Holzhauer is a very pleasant 76 yo caucasian male with an atypical stage IV lung cancer which was previously resected followed by systemic chemotherapy and maintenance pembrolizumab.  ? ?We now have him on adagrasib.  Hopefully, he will be able to tolerate this.  He is not on full dose. ? ?I will see if Radiation Oncology can help him. ? ?We will see if the Marinol can help him. ? ?This is clearly quality of life issues. ? ?I would like to see him back in about a month.  I do not think we have to do any scans on him probably until late May or early June. ? ?I think that his weight will clearly tell us how things are going for him. ? ?  ?[name], MD ?3/8/20234:01 PM ? ?

## 2021-09-25 ENCOUNTER — Telehealth: Payer: Self-pay | Admitting: Radiation Oncology

## 2021-09-25 ENCOUNTER — Encounter: Payer: Self-pay | Admitting: *Deleted

## 2021-09-25 DIAGNOSIS — C3492 Malignant neoplasm of unspecified part of left bronchus or lung: Secondary | ICD-10-CM

## 2021-09-25 NOTE — Telephone Encounter (Signed)
3/9 @ 9:10 am Left voicemail for patient to call our office.   ?

## 2021-09-25 NOTE — Progress Notes (Signed)
Per MD note, he would like to consider radiation for treatment of symptomatic spinal mets. Referral order placed.  ? ?Patient already scheduled for consult on 09/29/21. ? ?Oncology Nurse Navigator Documentation ? ?Oncology Nurse Navigator Flowsheets 09/25/2021  ?Abnormal Finding Date -  ?Confirmed Diagnosis Date -  ?Diagnosis Status -  ?Planned Course of Treatment -  ?Phase of Treatment -  ?Chemotherapy Actual Start Date: -  ?Chemotherapy Actual End Date: -  ?Surgery Actual Start Date: -  ?Navigator Follow Up Date: 10/24/2021  ?Navigator Follow Up Reason: Follow-up Appointment  ?Navigator Location [facility]  ?Referral Date to RadOnc/MedOnc -  ?Navigator Encounter Type Appt/Treatment Plan Review  ?Telephone -  ?Treatment Initiated Date -  ?Patient Visit Type MedOnc  ?Treatment Phase Active Tx  ?Barriers/Navigation Needs Coordination of Care;Education  ?Education -  ?Interventions Referrals  ?Acuity Level 2-Minimal Needs (1-2 Barriers Identified)  ?Referrals Radiation  ?Coordination of Care -  ?Education Method -  ?Support Groups/Services Friends and Family  ?Time Spent with Patient 15  ?  ? ? ?

## 2021-09-25 NOTE — Progress Notes (Signed)
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 ?Sites of Visceral and Bony Metastatic Disease: metastasis to right lung, bilateral adrenal glands, bony metastasis to T1, Y 10 and left iliac bone ? ?Location(s) of Symptomatic Metastases: new metastasis to T1 and T11 ? ?Past/Anticipated chemotherapy by medical oncology, if any:  ?Past Therapy: ?S/P cycle 6 of Carbo/Alimta/Pembrolizumab ?Alimta/pembrolizumab-maintenance therapy s/p cycle 3/4  - start on 11/28/2020 --Alimta discontinued after cycle 2 ?  ?Current Therapy:        ?Lumakras 360 mg po q day -- started on 07/31/2021 -- d/c on 02/07/203 for toxicity ?Xgeva every 3 months - due again 12/2021 ?Eliquis 5 mg PO BID ?Adagrasib 400 mg po BID -- start on 09/22/2021 ? ?Pain on a scale of 0-10 is:  denies pain at this time   ? ? ?If Spine Met(s), symptoms, if any, include: ?Bowel/Bladder retention or incontinence (please describe): denies ?Numbness or weakness in extremities (please describe): reports weakness ?Current Decadron regimen, if applicable:  none ? ?Ambulatory status? Walker? Wheelchair?: Ambulatory ? ?SAFETY ISSUES: ?Prior radiation? no ?Pacemaker/ICD? no ?Possible current pregnancy? no ?Is the patient on methotrexate? no ? ?Current Complaints / other details:  intermittent back pain and rib cage pain ? ?Vitals:  ? 09/29/21 0827  ?BP: 108/60  ?Pulse: 80  ?Resp: 20  ?Temp: 97.9 ?F (36.6 ?C)  ?SpO2: 100%  ?Weight: 174 lb 3.2 oz (79 kg)  ?Height: '5\' 11"'  (1.803 m)  ? ? ? ?

## 2021-09-26 ENCOUNTER — Encounter: Payer: Self-pay | Admitting: Hematology & Oncology

## 2021-09-26 ENCOUNTER — Other Ambulatory Visit: Payer: Self-pay

## 2021-09-26 DIAGNOSIS — C3492 Malignant neoplasm of unspecified part of left bronchus or lung: Secondary | ICD-10-CM

## 2021-09-26 MED ORDER — DRONABINOL 5 MG PO CAPS: 5.0000 mg | ORAL_CAPSULE | Freq: Two times a day (BID) | ORAL | 0 refills | Status: DC

## 2021-09-27 NOTE — Progress Notes (Signed)
Radiation Oncology         [phone] ________________________________  Follow-up New Visit  Outpatient   Name: John Hinton MRN: [id number]  Date: 09/29/2021  DOB: [birth year]  YP:[id number], Jenny Reichmann, MD  [name], MD   REFERRING PHYSICIAN: [name], MD  DIAGNOSIS: The primary encounter diagnosis was Non-small cell carcinoma of lung, stage 4, left ([place]). A diagnosis of Malignant neoplasm of lung, unspecified laterality, unspecified part of lung ([place]) was also pertinent to this visit.  The encounter diagnosis was Non-small cell carcinoma of lung, stage 4, left ([place]).   LUL adenocarcinoma, now with metastasis to right lung, bilateral adrenal glands, and bony metastasis to T1, T10, and left iliac bone. PET on 09/03/21 with evidence of recent disease progression.   Narrative / Interval History :John Hinton is a 76 y.o. male who returns today for re-evaluation and consideration of radiation therapy in management of his recent progression of metastatic lung cancer. I last met with the patient for his initial consultation on 05/14/21. During which time, we discussed potential palliative radiation therapy for management of his osseous metastasis. However, given that there were no concerning lesions at that time, I did not recommend preventative radiation therapy, and the patient continued with treatment consisting of lumakras.   Since then, the patient has maintained follow up with Dr. Marin Olp. Follow up chest CT on 07/04/21 demonstrated a constellation of findings consistent with a positive treatment response, including: essentially complete interval resolution of multiple newer small PET avid pulmonary nodules seen on prior examination; interval reduction in size of the right adrenal mass; and interval sclerosis of osseous metastatic lesions. CT also showed a unchanged small, chronic, loculated left pleural effusion, with associated pleural thickening and round atelectasis of  the dependent left lung base, and bandlike sequelae of a prior right middle lobe infarction, (seen acutely on prior examination dated 05/04/2021).  Restaging PET on 07/08/21 again showed an interval response to therapy, defined by the increasingly sclerotic appearance of bony metastases showing diminished or resolved FDG uptake compared to previous imaging. Resolution was also seen of the small pulmonary nodules which previously showed mildly increased metabolic activity in the right chest, as well as a decrease in size of bilateral adrenal metastases compared to previous imaging. Despite a decrease in size seen of the right adrenal metastasis, increased metabolic activity was seen in the right adrenal, and moderate increased FDG uptake in an adjacent portacaval lymph node. Other findings appreciated on PET included: a decrease in size of a presumed rounded atelectasis at the left lung base associated with a chronic appearing left effusion; diffuse metabolic activity throughout the colon (nonspecific but possibly in the setting of colitis); and generalized increased metabolic activity throughout the marrow spaces, particularly in the spine, and potentially related to marrow stimulation.   MRI of the brain on 09/03/21 demonstrated no evidence of intracranial metastatic disease. (Prompted due to reports of increased confusion. The patient's wife noted that the patient drank in excess during the super bowl which may have contributed to this).   Restaging PET on 09/03/21 demonstrated enlargement of the hypermetabolic right adrenal mass in the interval, measuring 5.7 x 6.5 cm and SUV max 17.6, previously measuring 4.0 x 5.4 cm and SUV max 23.6. A significant increase in size was also seen in the left adrenal mass, measuring 3.3 x 4.7 cm, SUV max 23.3, previously 1.3 cm and SUV max 3.5. PET also showed an increase in hypermetabolism of the T1, T11 and left iliac  wing metastases, worrisome for disease progression  (index lesion in the left iliac wing with an SUV max of 9.8, compared to 4.9 previously).   Per his most recent follow-up with Dr. Marin Olp on 09/24/21, the patient reported weight loss, significant lack of appetite, issues with voiding, as well as some pain issues. Given his loss of appetite, Dr. Marin Olp prescribed Marinol.   Current Therapy:        --Lumakras 360 mg po q day -- started on 07/31/2021 -- d/c on 02/07/203 for toxicities; diarrhea, elevated liver function studies, weight loss, changes in taste --Xgeva every 3 months - due again 12/2021 --Eliquis 5 mg PO BID --Adagrasib 400 mg po BID -- started on 09/24/2021, tolerating this well  Past Therapy: S/P cycle 6 of Carbo/Alimta/Pembrolizumab Alimta/pembrolizumab-maintenance therapy s/p cycle 3/4  - start on 11/28/2020 --Alimta discontinued after cycle 2    HISTORY OF PRESENT ILLNESS (Initial Consultation 05/14/21):: The patient initially presented to his PCP in September of 2021 with shortness of breath. Chest x-ray performed for evaluation on 04/16/2020 demonstrated a suspected 4.8 cm left upper lobe lung mass. Findings prompted CT of the chest on 04/16/2020 which demonstrated the opacity noted on the prior chest CT to correspond with a 3.1 cm pulmonary mass within the left upper lobe. An indeterminate 2.1 cm nodule within the left lung base was noted as well.    Accordingly, the patient was referred to Dr. Kipp Brood, and proceeded to undergo bronchoscopy with endobrachial biopsies on 05/09/2020 . Pathology from the procedure revealed malignant cells present consistent with non-small cell carcinoma of the LUL.    PET on 05/14/21 revealed a hypermetabolic 2.8 cm left upper lobe lung mass consistent with the primary lung neoplasm. No mediastinal or hilar lymphadenopathy or evidence of metastatic disease were elsewhere seen.    The patient was then referred to Dr. Valeta Harms on 05/17/20 for surgical evaluation/ consideration. Following  discussion, the patient opted to proceed with left-upper lobectomy on 05/22/20 under the care of Dr. Kipp Brood. Pathology from the procedure revealed moderately differentiated adenocarcinoma, acinar predominant, spanning 5.7 cm. Tumor noted to invade the pleura, and multiple pleural tumor deposits present. Nodal status of 6/9  lymph nodes involved by carcinoma.    Genetic testing collected on 05/22/21 revealed no actionable mutation, though was positive for PD-L1 and KRAS (G12C).    The patient was then referred to Dr. Marin Olp on 06/19/20 for consideration of further treatment. Following discussion, the patient opted to proceed with adjuvant chemotherapy consisting of Carbo/Alimta/Pembrolizumab. First dose on 06/27/20.    MRI of the brain performed on 06/22/20 prior to start of chemotherapy revealed no evidence of intracranial metastatic disease.    Restaging PET on 11/25/20 demonstrated no evidence of local recurrence following LEFT upper lobe resection. Loculated fluid collection was noted at the left lung base with mild increase in complexity. No evidence of mediastinal nodal metastasis, supraclavicular nodal metastasis, or distant metastatic disease was seen otherwise.   Following cycle 6 of Carbo/Alimta/Pembrolizumab, the patient continued therapy consisting of Alimta/pembrolizumab-maintenance starting on 11/28/20.    Cardiac MRI perfomred on 04/17/21 demonstrated mild left ventricular dilatation with moderate systolic dysfunction and an EF 39%. Mixed cardiomyopathy with evidence of both ischemic and nonischemic etiologies was appreciated as well. RV was of normal size with mild systolic dysfunction, and an EF of 42%.    Restaging PET on 04/23/21 unfortunately demonstrated new metastatic disease including the right lung, bilateral adrenal glands, and multiple locations in the skeleton (spinal metastasis to T1,  T10, left iliac bone). Left lung was seen with reduced left pleural effusion and  resolution of a region of probable rounded atelectasis in the left lower lobe.   Accordingly, the patient met with Dr. Marin Olp on 04/25/21 to discuss these findings. During this visit, the patient reported back pain. Dr. Marin Olp noted that the patient's disease recurrence is likely due to his  K-ras mutation, which puts him at an increased risk of recurrence and can decrease response to chemotherapy. Due to this, the patient was placed on  Lumakras, 960 mg daily.    MRI of the thoracic and lumbar spine with and without contrast performed on 05/03/21 demonstrated abnormal signal and contrast enhancement in the T1 and T10 vertebral bodies, concerning for metastatic disease. Abnormal signal and contrast enhancement was also appreciated in the left iliac bone, adjacent the sacroiliac joint, also concerning for metastatic disease, measuring up to 2.2 cm in the axial plane.    MRI of the brain performed  to rule out metastasis on 05/03/21 demonstrated no acute intracranial process, and no evidence of metastatic disease in the brain.   The patient presented to the ED on 05/04/21 with chest pain, shortness of breath and confusion. Patient was found to have submassive PE (on imaging below). He was admitted due to right-sided pleuritic pain on inhalation. Patient was discharged home on 05/06/21 with Eliquis or Xarelto. Imaging performed during hospital course is detailed below: --CT of the head without contrast on 05/04/21 again demonstrated no acute intracranial abnormality. CTA performed on this same date demonstrated findings consistent with pulmonary embolism involving the right lower lobe and right middle lobe branches. No right heart strain was noted. Some associated new airspace opacity was noted in the right middle lobe likely related to the underlying embolus. Multiple bony metastatic lesions were again noted, with a pathologic fracture seen involving the right fourth rib laterally. Right adrenal lesion  consistent with metastatic disease was noted as well, stable since prior imaging.   At this time the patient reports pain primarily in his right great toe.  Concern is for potential gout.  Images were taken of the feet and forwarded to Dr. Marin Olp for review.  In addition the patient complains of pain in the lower thoracic spine area possibly in the bilateral flank areas.  He denies any significant pain in the left pelvis region or upper thoracic spine area.     PREVIOUS RADIATION THERAPY: No  PAST MEDICAL HISTORY:  Past Medical History:  Diagnosis Date   BPH (benign prostatic hypertrophy)    Bradycardia    a. H/o asymptomatic bradycardia.   Coronary artery disease    a. s/p CABG ~2007 with LIMA to LAD, SVG seq to OM1 and OM2, SVG to PDA and SVG to diag. b. Abnormal nuc 03/2015 - s/p DES to SVG-OM1-OM2. 01/29/17 POBA with cutting balloon to SVG-->OM.  No ischemia Lexi myoview 2021   DCM (dilated cardiomyopathy) ([place]) 09/17/2015   mixed by cMRI 03/2021 with EF 39% with non viable infarct in basal to mid lateral wall and LGE pattern c/w nonischemic component as well. Mild RV dysfunction RVEF 42%.   Dyslipidemia    Dyspnea    r/t lung mass   ED (erectile dysfunction)    Fear of heights    Goals of care, counseling/discussion 06/19/2020   Hypertension    Non-small cell carcinoma of lung, stage 4, left ([place]) 06/19/2020   OSA (obstructive sleep apnea) 07/12/2015   Moderate OSA with AHI 16.5/hr now on  CPAP at 8cm H2O, uses cpap nightly      RBBB    Shoulder, capsulitis, adhesive    Left Shoulder    PAST SURGICAL HISTORY: Past Surgical History:  Procedure Laterality Date   APPENDECTOMY     BRONCHIAL BIOPSY  05/09/2020   Procedure: BRONCHIAL BIOPSIES;  Surgeon: [name], DO;  Location: [facility];  Service: Pulmonary;;   BRONCHIAL BRUSHINGS  05/09/2020   Procedure: BRONCHIAL BRUSHINGS;  Surgeon: [name], DO;  Location: [facility];  Service: Pulmonary;;   BRONCHIAL  NEEDLE ASPIRATION BIOPSY  05/09/2020   Procedure: BRONCHIAL NEEDLE ASPIRATION BIOPSIES;  Surgeon: [name], DO;  Location: [facility];  Service: Pulmonary;;   BRONCHIAL WASHINGS  05/09/2020   Procedure: BRONCHIAL WASHINGS;  Surgeon: [name], DO;  Location: [facility];  Service: Pulmonary;;   CARDIAC CATHETERIZATION N/A 04/05/2015   Procedure: Left Heart Cath and Coronary Angiography;  Surgeon: Sherren Mocha, MD;  Location: [facility];  Service: Cardiovascular;  Laterality: N/A;   COLONOSCOPY     CORONARY ARTERY BYPASS GRAFT     w/LIMA to LAD, seq SVG to OM1 and OM2, SVG to PDA and SVG to Diagonal   CORONARY BALLOON ANGIOPLASTY N/A 01/29/2017   Procedure: Coronary Balloon Angioplasty;  Surgeon: Martinique, Peter M, MD;  Location: [facility];  Service: Cardiovascular;  Laterality: N/A;   FIDUCIAL MARKER PLACEMENT  05/09/2020   Procedure: FIDUCIAL MARKER PLACEMENT;  Surgeon: [name], DO;  Location: [facility];  Service: Pulmonary;;   INTERCOSTAL NERVE BLOCK Left 05/22/2020   Procedure: INTERCOSTAL NERVE BLOCK;  Surgeon: Lajuana Matte, MD;  Location: [facility];  Service: Thoracic;  Laterality: Left;   LEFT HEART CATH AND CORS/GRAFTS ANGIOGRAPHY N/A 01/29/2017   Procedure: Left Heart Cath and Cors/Grafts Angiography;  Surgeon: Martinique, Peter M, MD;  Location: [facility];  Service: Cardiovascular;  Laterality: N/A;   NODE DISSECTION N/A 05/22/2020   Procedure: NODE DISSECTION;  Surgeon: Lajuana Matte, MD;  Location: [facility];  Service: Thoracic;  Laterality: N/A;   VIDEO BRONCHOSCOPY WITH ENDOBRONCHIAL NAVIGATION N/A 05/09/2020   Procedure: VIDEO BRONCHOSCOPY WITH ENDOBRONCHIAL NAVIGATION;  Surgeon: [name], DO;  Location: [facility];  Service: Pulmonary;  Laterality: N/A;   VIDEO BRONCHOSCOPY WITH ENDOBRONCHIAL ULTRASOUND N/A 05/09/2020   Procedure: VIDEO BRONCHOSCOPY WITH ENDOBRONCHIAL ULTRASOUND;  Surgeon: [name], DO;   Location: [facility];  Service: Pulmonary;  Laterality: N/A;    FAMILY HISTORY:  Family History  Problem Relation Age of Onset   Lung cancer Mother    Lung cancer Father    Heart disease Father    Heart failure Father    Cancer Sister     SOCIAL HISTORY:  Social History   Tobacco Use   Smoking status: Former    Types: Cigarettes    Quit date: 07/21/1983    Years since quitting: 38.2   Smokeless tobacco: Former  Scientific laboratory technician Use: Never used  Substance Use Topics   Alcohol use: Yes    Alcohol/week: 20.0 - 28.0 standard drinks    Types: 20 - 28 Standard drinks or equivalent per week    Comment: daily - 20-28 /week   Drug use: No    ALLERGIES:  Allergies  Allergen Reactions   Amlodipine Swelling    Swelling of ankles    MEDICATIONS:  Current Outpatient Medications  Medication Sig Dispense Refill   Adagrasib 200 MG TABS Take 400 mg  by mouth 2 (two) times daily. 120 tablet 4   apixaban (ELIQUIS) 5 MG TABS tablet Take 5 mg by mouth 2 (two) times daily.     aspirin 81 MG tablet Take 81 mg by mouth at bedtime.      cholecalciferol (VITAMIN D) 1000 UNITS tablet Take 1,000 Units by mouth at bedtime.      Cyanocobalamin (B-12 PO) Take 1,000 mcg by mouth daily.      diphenoxylate-atropine (LOMOTIL) 2.5-0.025 MG tablet TAKE 2 TABLETS BY MOUTH AT ONSET OF DIARRHEA THEN TAKE 1 TABLET AFTER EACH LOOSE STOOL MAX 8 TABLETS PER DAY 329 tablet 0   folic acid (FOLVITE) 1 MG tablet TAKE ONE TABLET BY MOUTH DAILY START 5 TO 7 DAYS BEFORE ALIMTA CHEMO AND CONTINUE UNTIL 21 DAYS AFTER COMPLETED 90 tablet 3   metoprolol succinate (TOPROL-XL) 25 MG 24 hr tablet Take 1 tablet (25 mg total) by mouth at bedtime. 90 tablet 3   oxyCODONE-acetaminophen (PERCOCET) 7.5-325 MG tablet Take 1 tablet by mouth every 4 (four) hours as needed for severe pain. 60 tablet 0   rosuvastatin (CRESTOR) 40 MG tablet Take 1 tablet (40 mg total) by mouth at bedtime. 90 tablet 3   sacubitril-valsartan  (ENTRESTO) 24-26 MG Take 1 tablet by mouth 2 (two) times daily. 60 tablet 11   sertraline (ZOLOFT) 50 MG tablet Take 50 mg by mouth in the morning and at bedtime.      traMADol (ULTRAM) 50 MG tablet Take 1 tablet (50 mg total) by mouth every 6 (six) hours as needed. 90 tablet 0   colchicine 0.6 MG tablet Take 1 tablet (0.6 mg total) by mouth every 8 (eight) hours as needed. 60 tablet 1   dronabinol (MARINOL) 5 MG capsule Take 1 capsule (5 mg total) by mouth 2 (two) times daily before lunch and supper. (Patient not taking: Reported on 09/29/2021) 60 capsule 0   ezetimibe (ZETIA) 10 MG tablet Take 1 tablet (10 mg total) by mouth daily. (Patient not taking: Reported on 09/29/2021) 90 tablet 3   methylPREDNISolone (MEDROL DOSEPAK) 4 MG TBPK tablet Take as directed. 1 each 0   nitroGLYCERIN (NITROSTAT) 0.4 MG SL tablet Place 1 tablet (0.4 mg total) under the tongue every 5 (five) minutes as needed for chest pain. (Patient not taking: Reported on 09/24/2021) 25 tablet 3   spironolactone (ALDACTONE) 25 MG tablet Take 1 tablet (25 mg total) by mouth daily. (Patient not taking: Reported on 09/29/2021) 90 tablet 3   No current facility-administered medications for this encounter.    REVIEW OF SYSTEMS:  A 10+ POINT REVIEW OF SYSTEMS WAS OBTAINED including neurology, dermatology, psychiatry, cardiac, respiratory, lymph, extremities, GI, GU, musculoskeletal, constitutional, reproductive, HEENT.  Pain issues as above   PHYSICAL EXAM:  height is '5\' 11"'  (1.803 m) and weight is 174 lb 3.2 oz (79 kg). His temperature is 97.9 F (36.6 C). His blood pressure is 108/60 and his pulse is 80. His respiration is 20 and oxygen saturation is 100%.   General: Alert and oriented, in no acute distress HEENT: Head is normocephalic. Extraocular movements are intact.  Neck: Neck is supple, no palpable cervical or supraclavicular lymphadenopathy. Heart: Regular in rate and rhythm with no murmurs, rubs, or gallops. Chest: Clear to  auscultation bilaterally, with no rhonchi, wheezes, or rales. Abdomen: Soft, nontender, nondistended, with no rigidity or guarding. Extremities: No cyanosis or edema.  Right great toe is swollen and erythematous consistent with gout. Lymphatics: see Neck Exam Skin: No concerning  lesions. Musculoskeletal: symmetric strength and muscle tone throughout. Neurologic: Cranial nerves II through XII are grossly intact. No obvious focalities. Speech is fluent. Coordination is intact. Psychiatric: Judgment and insight are intact. Affect is appropriate.   ECOG = 1  0 - Asymptomatic (Fully active, able to carry on all predisease activities without restriction)  1 - Symptomatic but completely ambulatory (Restricted in physically strenuous activity but ambulatory and able to carry out work of a light or sedentary nature. For example, light housework, office work)  2 - Symptomatic, <50% in bed during the day (Ambulatory and capable of all self care but unable to carry out any work activities. Up and about more than 50% of waking hours)  3 - Symptomatic, >50% in bed, but not bedbound (Capable of only limited self-care, confined to bed or chair 50% or more of waking hours)  4 - Bedbound (Completely disabled. Cannot carry on any self-care. Totally confined to bed or chair)  5 - Death   Eustace Pen MM, Creech RH, Tormey DC, et al. [phone]). "Toxicity and response criteria of the [facility]". [place] Oncol. 5 (6): 649-55  LABORATORY DATA:  Lab Results  Component Value Date   WBC 12.3 (H) 09/24/2021   HGB 12.9 (L) 09/24/2021   HCT 39.0 09/24/2021   MCV 95.8 09/24/2021   PLT 255 09/24/2021   NEUTROABS 10.0 (H) 09/24/2021   Lab Results  Component Value Date   NA 137 09/24/2021   K 4.4 09/24/2021   CL 101 09/24/2021   CO2 27 09/24/2021   GLUCOSE 125 (H) 09/24/2021   CREATININE 0.98 09/24/2021   CALCIUM 9.3 09/24/2021      RADIOGRAPHY: MR Brain W Wo Contrast  Result Date:  09/03/2021 CLINICAL DATA:  Staging of non-small cell lung carcinoma EXAM: MRI HEAD WITHOUT AND WITH CONTRAST TECHNIQUE: Multiplanar, multiecho pulse sequences of the brain and surrounding structures were obtained without and with intravenous contrast. CONTRAST:  67m GADAVIST GADOBUTROL 1 MMOL/ML IV SOLN COMPARISON:  05/03/2021 FINDINGS: Brain: No acute infarct, mass effect or extra-axial collection. No acute or chronic hemorrhage. There is multifocal hyperintense T2-weighted signal within the white matter. Generalized volume loss without a clear lobar predilection. The midline structures are normal. There is no abnormal contrast enhancement. Vascular: Major flow voids are preserved. Skull and upper cervical spine: Normal calvarium and skull base. Visualized upper cervical spine and soft tissues are normal. Sinuses/Orbits:No paranasal sinus fluid levels or advanced mucosal thickening. No mastoid or middle ear effusion. Normal orbits. IMPRESSION: 1. No intracranial metastatic disease. 2. Mild chronic microvascular disease and generalized volume loss. Electronically Signed   By: KUlyses JarredM.D.   On: 09/03/2021 20:54   NM PET Image Restag (PS) Skull Base To Thigh  Result Date: 09/22/2021 CLINICAL DATA:  Subsequent treatment strategy for small cell lung cancer, on oral chemotherapy. EXAM: NUCLEAR MEDICINE PET SKULL BASE TO THIGH TECHNIQUE: 8.5 mCi F-18 FDG was injected intravenously. Full-ring PET imaging was performed from the skull base to thigh after the radiotracer. CT data was obtained and used for attenuation correction and anatomic localization. Fasting blood glucose: 94 mg/dl COMPARISON:  07/08/2021 and CT chest 07/04/2021. FINDINGS: Mediastinal blood pool activity: SUV max 2.6 Liver activity: SUV max NA NECK: Left roof of mouth asymmetric hypermetabolism without a definite CT correlate. No hypermetabolic lymph nodes. Incidental CT findings: None. CHEST: No hypermetabolic mediastinal, hilar or axillary  nodes. No hypermetabolic pulmonary nodules. Incidental CT findings: Atherosclerotic calcification of the aorta and aortic valve.  Enlarged pulmonic trunk and heart. No pericardial effusion. Calcified mediastinal and hilar lymph nodes. Calcified granulomas. Pleuroparenchymal scarring bilaterally, worst in the left lower lobe. Left upper lobectomy. ABDOMEN/PELVIS: Hypermetabolic right adrenal mass is enlarged in the interval, now measuring 5.7 x 6.5 cm and SUV max 17.6, compared to 4.0 x 5.4 cm and SUV max 23.6. Left adrenal mass is increased in size significantly, now measuring 3.3 x 4.7 cm, SUV max 23.3, compared to 1.3 cm and SUV max 3.5. No abnormal hypermetabolism in the liver, spleen or pancreas. 1.4 cm portacaval lymph node (4/122), SUV max 3.3 previously 5.2. Incidental CT findings: 1.5 cm fluid density lesion in the left hepatic lobe, likely a cyst. Liver and gallbladder are otherwise unremarkable. Subcentimeter low-attenuation lesions in the kidneys are too small to characterize. 2.4 cm fluid density lesion in the left kidney, likely a cyst. Tiny stone in the right kidney. Spleen, pancreas, stomach and bowel are otherwise grossly unremarkable. Atherosclerotic calcification of the aorta. SKELETON: Increasingly hypermetabolic lesions in the T1 and T11 vertebral bodies as well as iliac wing. Index lesion in the left iliac wing has an SUV max 9.8, compared to 4.9 previously. Incidental CT findings: Degenerative changes in the spine. IMPRESSION: 1. Enlarging hypermetabolic adrenal metastases. 2. Increasingly hypermetabolic T1, Y19 and left iliac wing metastases, worrisome for disease progression. Post treatment healing can also have this effect. 3. Tiny right renal stone. 4.  Aortic atherosclerosis (ICD10-I70.0). 5. Enlarged pulmonic trunk, indicative of pulmonary arterial hypertension. Electronically Signed   By: Lorin Picket M.D.   On: 09/22/2021 13:54      IMPRESSION: The encounter diagnosis was Non-small  cell carcinoma of lung, stage 4, left ([place]).   LUL adenocarcinoma, now with metastasis to right lung, bilateral adrenal glands, and bony metastasis to T1, T10, and left iliac bone. PET on 09/03/21 with evidence of recent disease progression.   We reviewed the patient's PET scan in detail today.  We noted 5 areas which show progression on his bone scan that being T1, T11, bilateral adrenal glands and the left iliac bone.  I also discussed potential treatments of these areas with Dr. Marin Olp who recommends coverage of all these areas if technically possible.   We discussed the natural history of non-small cell lung cancer and general treatment, highlighting the role of radiotherapy in the management.  We discussed the available radiation techniques, and focused on the details of logistics and delivery.  We reviewed the anticipated acute and late sequelae associated with radiation in this setting.  The patient was encouraged to ask questions that I answered to the best of my ability.  A patient consent form was discussed and signed.  We retained a copy for our records.  The patient would like to proceed with radiation and will be scheduled for CT simulation.  PLAN: Patient will return for CT simulation later this week.  Anticipate between 10 and 15 treatments directed at the 5 areas mentioned above.   35 minutes of total time was spent for this patient encounter, including preparation, face-to-face counseling with the patient and coordination of care, physical exam, and documentation of the encounter.   ------------------------------------------------  Blair Promise, PhD, MD  This document serves as a record of services personally performed by Gery Pray, MD. It was created on his behalf by Roney Mans, a trained medical scribe. The creation of this record is based on the scribe's personal observations and the provider's statements to them. This document has been checked and approved  by the  attending provider.

## 2021-09-29 ENCOUNTER — Ambulatory Visit
Admission: RE | Admit: 2021-09-29 | Discharge: 2021-09-29 | Disposition: A | Discharge status: Home / Self Care | Payer: Medicare Other | Source: Ambulatory Visit | Attending: Radiation Oncology | Admitting: Radiation Oncology

## 2021-09-29 ENCOUNTER — Other Ambulatory Visit: Payer: Self-pay | Admitting: *Deleted

## 2021-09-29 ENCOUNTER — Encounter: Payer: Self-pay | Admitting: Radiation Oncology

## 2021-09-29 ENCOUNTER — Encounter: Payer: Self-pay | Admitting: *Deleted

## 2021-09-29 ENCOUNTER — Other Ambulatory Visit: Payer: Self-pay

## 2021-09-29 VITALS — BP 108/60 | HR 80 | Temp 97.9°F | Resp 20 | Ht 71.0 in | Wt 174.2 lb

## 2021-09-29 DIAGNOSIS — R410 Disorientation, unspecified: Secondary | ICD-10-CM | POA: Insufficient documentation

## 2021-09-29 DIAGNOSIS — Z7982 Long term (current) use of aspirin: Secondary | ICD-10-CM | POA: Insufficient documentation

## 2021-09-29 DIAGNOSIS — M79674 Pain in right toe(s): Secondary | ICD-10-CM | POA: Insufficient documentation

## 2021-09-29 DIAGNOSIS — I700 Atherosclerosis of aorta: Secondary | ICD-10-CM | POA: Diagnosis not present

## 2021-09-29 DIAGNOSIS — E785 Hyperlipidemia, unspecified: Secondary | ICD-10-CM | POA: Insufficient documentation

## 2021-09-29 DIAGNOSIS — Z87891 Personal history of nicotine dependence: Secondary | ICD-10-CM | POA: Diagnosis not present

## 2021-09-29 DIAGNOSIS — Z809 Family history of malignant neoplasm, unspecified: Secondary | ICD-10-CM | POA: Insufficient documentation

## 2021-09-29 DIAGNOSIS — Z801 Family history of malignant neoplasm of trachea, bronchus and lung: Secondary | ICD-10-CM | POA: Diagnosis not present

## 2021-09-29 DIAGNOSIS — G4730 Sleep apnea, unspecified: Secondary | ICD-10-CM | POA: Diagnosis not present

## 2021-09-29 DIAGNOSIS — C7971 Secondary malignant neoplasm of right adrenal gland: Secondary | ICD-10-CM | POA: Insufficient documentation

## 2021-09-29 DIAGNOSIS — N200 Calculus of kidney: Secondary | ICD-10-CM | POA: Diagnosis not present

## 2021-09-29 DIAGNOSIS — C3492 Malignant neoplasm of unspecified part of left bronchus or lung: Secondary | ICD-10-CM

## 2021-09-29 DIAGNOSIS — Z7901 Long term (current) use of anticoagulants: Secondary | ICD-10-CM | POA: Diagnosis not present

## 2021-09-29 DIAGNOSIS — I2510 Atherosclerotic heart disease of native coronary artery without angina pectoris: Secondary | ICD-10-CM | POA: Insufficient documentation

## 2021-09-29 DIAGNOSIS — J90 Pleural effusion, not elsewhere classified: Secondary | ICD-10-CM | POA: Insufficient documentation

## 2021-09-29 DIAGNOSIS — Z79899 Other long term (current) drug therapy: Secondary | ICD-10-CM | POA: Diagnosis not present

## 2021-09-29 DIAGNOSIS — C7951 Secondary malignant neoplasm of bone: Secondary | ICD-10-CM | POA: Diagnosis not present

## 2021-09-29 DIAGNOSIS — C7972 Secondary malignant neoplasm of left adrenal gland: Secondary | ICD-10-CM | POA: Insufficient documentation

## 2021-09-29 DIAGNOSIS — Z7952 Long term (current) use of systemic steroids: Secondary | ICD-10-CM | POA: Insufficient documentation

## 2021-09-29 DIAGNOSIS — M546 Pain in thoracic spine: Secondary | ICD-10-CM | POA: Insufficient documentation

## 2021-09-29 DIAGNOSIS — C7801 Secondary malignant neoplasm of right lung: Secondary | ICD-10-CM | POA: Insufficient documentation

## 2021-09-29 DIAGNOSIS — I10 Essential (primary) hypertension: Secondary | ICD-10-CM | POA: Diagnosis not present

## 2021-09-29 DIAGNOSIS — C3412 Malignant neoplasm of upper lobe, left bronchus or lung: Secondary | ICD-10-CM | POA: Insufficient documentation

## 2021-09-29 DIAGNOSIS — N400 Enlarged prostate without lower urinary tract symptoms: Secondary | ICD-10-CM | POA: Diagnosis not present

## 2021-09-29 DIAGNOSIS — C3490 Malignant neoplasm of unspecified part of unspecified bronchus or lung: Secondary | ICD-10-CM

## 2021-09-29 MED ORDER — METHYLPREDNISOLONE 4 MG PO TBPK: ORAL_TABLET | ORAL | 0 refills | Status: DC

## 2021-09-29 MED ORDER — COLCHICINE 0.6 MG PO TABS: 0.6000 mg | ORAL_TABLET | Freq: Three times a day (TID) | ORAL | 1 refills | Status: DC | PRN

## 2021-09-29 NOTE — Progress Notes (Signed)
See MD note for nursing evaluation. °

## 2021-09-30 ENCOUNTER — Ambulatory Visit
Admission: RE | Admit: 2021-09-30 | Discharge: 2021-09-30 | Disposition: A | Discharge status: Home / Self Care | Payer: Medicare Other | Source: Ambulatory Visit | Attending: Radiation Oncology | Admitting: Radiation Oncology

## 2021-09-30 DIAGNOSIS — C7951 Secondary malignant neoplasm of bone: Secondary | ICD-10-CM | POA: Insufficient documentation

## 2021-09-30 DIAGNOSIS — C7972 Secondary malignant neoplasm of left adrenal gland: Secondary | ICD-10-CM | POA: Diagnosis not present

## 2021-09-30 DIAGNOSIS — C7971 Secondary malignant neoplasm of right adrenal gland: Secondary | ICD-10-CM | POA: Insufficient documentation

## 2021-09-30 DIAGNOSIS — C3412 Malignant neoplasm of upper lobe, left bronchus or lung: Secondary | ICD-10-CM | POA: Diagnosis not present

## 2021-09-30 DIAGNOSIS — Z510 Encounter for antineoplastic radiation therapy: Secondary | ICD-10-CM | POA: Insufficient documentation

## 2021-09-30 DIAGNOSIS — C3492 Malignant neoplasm of unspecified part of left bronchus or lung: Secondary | ICD-10-CM

## 2021-10-03 ENCOUNTER — Other Ambulatory Visit (HOSPITAL_COMMUNITY): Payer: Self-pay

## 2021-10-06 DIAGNOSIS — C7951 Secondary malignant neoplasm of bone: Secondary | ICD-10-CM | POA: Diagnosis not present

## 2021-10-06 DIAGNOSIS — Z510 Encounter for antineoplastic radiation therapy: Secondary | ICD-10-CM | POA: Diagnosis not present

## 2021-10-06 DIAGNOSIS — C7971 Secondary malignant neoplasm of right adrenal gland: Secondary | ICD-10-CM | POA: Diagnosis not present

## 2021-10-06 DIAGNOSIS — C7972 Secondary malignant neoplasm of left adrenal gland: Secondary | ICD-10-CM | POA: Diagnosis not present

## 2021-10-06 DIAGNOSIS — C3412 Malignant neoplasm of upper lobe, left bronchus or lung: Secondary | ICD-10-CM | POA: Diagnosis not present

## 2021-10-07 ENCOUNTER — Ambulatory Visit
Admission: RE | Admit: 2021-10-07 | Discharge: 2021-10-07 | Disposition: A | Discharge status: Home / Self Care | Payer: Medicare Other | Source: Ambulatory Visit | Attending: Radiation Oncology | Admitting: Radiation Oncology

## 2021-10-07 ENCOUNTER — Telehealth: Payer: Self-pay | Admitting: Pharmacy Technician

## 2021-10-07 ENCOUNTER — Other Ambulatory Visit: Payer: Self-pay

## 2021-10-07 DIAGNOSIS — C3412 Malignant neoplasm of upper lobe, left bronchus or lung: Secondary | ICD-10-CM | POA: Diagnosis not present

## 2021-10-07 DIAGNOSIS — C7972 Secondary malignant neoplasm of left adrenal gland: Secondary | ICD-10-CM | POA: Diagnosis not present

## 2021-10-07 DIAGNOSIS — Z510 Encounter for antineoplastic radiation therapy: Secondary | ICD-10-CM | POA: Diagnosis not present

## 2021-10-07 DIAGNOSIS — C3492 Malignant neoplasm of unspecified part of left bronchus or lung: Secondary | ICD-10-CM

## 2021-10-07 DIAGNOSIS — C7951 Secondary malignant neoplasm of bone: Secondary | ICD-10-CM | POA: Diagnosis not present

## 2021-10-07 DIAGNOSIS — C7971 Secondary malignant neoplasm of right adrenal gland: Secondary | ICD-10-CM | POA: Diagnosis not present

## 2021-10-08 ENCOUNTER — Encounter: Payer: Self-pay | Admitting: Cardiology

## 2021-10-08 ENCOUNTER — Ambulatory Visit (INDEPENDENT_AMBULATORY_CARE_PROVIDER_SITE_OTHER): Payer: Medicare Other | Admitting: Cardiology

## 2021-10-08 ENCOUNTER — Ambulatory Visit
Admission: RE | Admit: 2021-10-08 | Discharge: 2021-10-08 | Disposition: A | Discharge status: Home / Self Care | Payer: Medicare Other | Source: Ambulatory Visit | Attending: Radiation Oncology | Admitting: Radiation Oncology

## 2021-10-08 VITALS — BP 108/68 | HR 72 | Ht 71.0 in | Wt 176.6 lb

## 2021-10-08 DIAGNOSIS — I420 Dilated cardiomyopathy: Secondary | ICD-10-CM

## 2021-10-08 DIAGNOSIS — C7972 Secondary malignant neoplasm of left adrenal gland: Secondary | ICD-10-CM | POA: Diagnosis not present

## 2021-10-08 DIAGNOSIS — E7800 Pure hypercholesterolemia, unspecified: Secondary | ICD-10-CM | POA: Diagnosis not present

## 2021-10-08 DIAGNOSIS — C7971 Secondary malignant neoplasm of right adrenal gland: Secondary | ICD-10-CM | POA: Diagnosis not present

## 2021-10-08 DIAGNOSIS — F4323 Adjustment disorder with mixed anxiety and depressed mood: Secondary | ICD-10-CM | POA: Diagnosis not present

## 2021-10-08 DIAGNOSIS — I2699 Other pulmonary embolism without acute cor pulmonale: Secondary | ICD-10-CM | POA: Diagnosis not present

## 2021-10-08 DIAGNOSIS — C3412 Malignant neoplasm of upper lobe, left bronchus or lung: Secondary | ICD-10-CM | POA: Diagnosis not present

## 2021-10-08 DIAGNOSIS — C7951 Secondary malignant neoplasm of bone: Secondary | ICD-10-CM | POA: Diagnosis not present

## 2021-10-08 DIAGNOSIS — I2510 Atherosclerotic heart disease of native coronary artery without angina pectoris: Secondary | ICD-10-CM | POA: Diagnosis not present

## 2021-10-08 DIAGNOSIS — I10 Essential (primary) hypertension: Secondary | ICD-10-CM | POA: Diagnosis not present

## 2021-10-08 DIAGNOSIS — F40228 Other natural environment type phobia: Secondary | ICD-10-CM | POA: Diagnosis not present

## 2021-10-08 DIAGNOSIS — F411 Generalized anxiety disorder: Secondary | ICD-10-CM | POA: Diagnosis not present

## 2021-10-08 DIAGNOSIS — Z510 Encounter for antineoplastic radiation therapy: Secondary | ICD-10-CM | POA: Diagnosis not present

## 2021-10-08 NOTE — Progress Notes (Signed)
? ?Office Visit  Note  ? ?Date:  10/08/2021  ? ?ID:  John Hinton, DOB 09-Jan-1946, MRN [id number] ? ? ?PCP:  [name], MD  ?Cardiologist:  Fransico Him, MD ?Electrophysiologist:  None  ? ?Chief Complaint:  CAD, HTN, lipids and OSA ? ?History of Present Illness:   ? ?John Hinton is a 76 y.o. male with a hx of ASCAD s/p CABG and then PCI of SVG to OM1.  Repeat heart cath for unstable angina on 01/27/2017 showed severe focal in-stent restenosis in the SVG to OM stent status post balloon angioplasty.  All of the grafts were patent.  He has a history of hyperlipidemia and hypertension as well as moderate OSA with a PSG showing an AHI of 16.5/hr and is on CPAP at 8cm H2O. ? 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 He is now in remission and his SOB has significantly improved.  ? ?He was hospitalized on 05/06/2021 due to chest pain and shortness of breath with confusion.  He was found to have submassive PE and was discharged home on Eliquis.  His thrombus was not amenable to embolectomy due to distal location. 2D echo showed EF 45-50% with G1DD, PASP 38mmHg, mild LAE, mild MR. ? ?He is here today for followup and is doing well.  He has chronic DOE due to sedentary state.  He denies any chest pain or pressure,  PND, orthopnea, LE edema (except for gout), dizziness (except if he gets up too fast), palpitations or syncope. He is compliant with his meds and is tolerating meds with no SE.    ? ?Prior CV studies:   ?The following studies were reviewed today: ? ?Labs from PCP ? ?Past Medical History:  ?Diagnosis Date  ? BPH (benign prostatic hypertrophy)   ? Bradycardia   ? a. H/o asymptomatic bradycardia.  ? Coronary artery disease   ? a. s/p CABG ~2007 with LIMA to LAD, SVG seq to OM1 and OM2, SVG to PDA and SVG to diag. b. Abnormal nuc 03/2015 - s/p DES to SVG-OM1-OM2. 01/29/17 POBA with cutting  balloon to SVG-->OM.  No ischemia Lexi myoview 2021  ? DCM (dilated cardiomyopathy) ([place]) 09/17/2015  ? mixed by cMRI 03/2021 with EF 39% with non viable infarct in basal to mid lateral wall and LGE pattern c/w nonischemic component as well. Mild RV dysfunction RVEF 42%.  ? Dyslipidemia   ? Dyspnea   ? r/t lung mass  ? ED (erectile dysfunction)   ? Fear of heights   ? Goals of care, counseling/discussion 06/19/2020  ? Hypertension   ? Non-small cell carcinoma of lung, stage 4, left ([place]) 06/19/2020  ? OSA (obstructive sleep apnea) 07/12/2015  ? Moderate OSA with AHI 16.5/hr now on CPAP at 8cm H2O, uses cpap nightly     ? RBBB   ? Shoulder, capsulitis, adhesive   ? Left Shoulder  ? ?Past Surgical History:  ?Procedure Laterality Date  ? APPENDECTOMY    ? BRONCHIAL BIOPSY  05/09/2020  ? Procedure: BRONCHIAL BIOPSIES;  Surgeon: [name], DO;  Location: [facility];  Service: Pulmonary;;  ? BRONCHIAL BRUSHINGS  05/09/2020  ? Procedure: BRONCHIAL BRUSHINGS;  Surgeon: [name], DO;  Location: [facility];  Service: Pulmonary;;  ? BRONCHIAL NEEDLE ASPIRATION BIOPSY  05/09/2020  ? Procedure: BRONCHIAL NEEDLE ASPIRATION BIOPSIES;  Surgeon: [name], DO;  Location: [facility];  Service: Pulmonary;;  ?  BRONCHIAL WASHINGS  05/09/2020  ? Procedure: BRONCHIAL WASHINGS;  Surgeon: [name], DO;  Location: [facility];  Service: Pulmonary;;  ? CARDIAC CATHETERIZATION N/A 04/05/2015  ? Procedure: Left Heart Cath and Coronary Angiography;  Surgeon: Sherren Mocha, MD;  Location: [facility];  Service: Cardiovascular;  Laterality: N/A;  ? COLONOSCOPY    ? CORONARY ARTERY BYPASS GRAFT    ? w/LIMA to LAD, seq SVG to OM1 and OM2, SVG to PDA and SVG to Diagonal  ? CORONARY BALLOON ANGIOPLASTY N/A 01/29/2017  ? Procedure: Coronary Balloon Angioplasty;  Surgeon: Martinique, Peter M, MD;  Location: [facility];  Service: Cardiovascular;  Laterality: N/A;  ? FIDUCIAL MARKER PLACEMENT  05/09/2020  ?  Procedure: FIDUCIAL MARKER PLACEMENT;  Surgeon: [name], DO;  Location: [facility];  Service: Pulmonary;;  ? INTERCOSTAL NERVE BLOCK Left 05/22/2020  ? Procedure: INTERCOSTAL NERVE BLOCK;  Surgeon: Lajuana Matte, MD;  Location: [facility];  Service: Thoracic;  Laterality: Left;  ? LEFT HEART CATH AND CORS/GRAFTS ANGIOGRAPHY N/A 01/29/2017  ? Procedure: Left Heart Cath and Cors/Grafts Angiography;  Surgeon: Martinique, Peter M, MD;  Location: [facility];  Service: Cardiovascular;  Laterality: N/A;  ? NODE DISSECTION N/A 05/22/2020  ? Procedure: NODE DISSECTION;  Surgeon: Lajuana Matte, MD;  Location: [facility];  Service: Thoracic;  Laterality: N/A;  ? VIDEO BRONCHOSCOPY WITH ENDOBRONCHIAL NAVIGATION N/A 05/09/2020  ? Procedure: VIDEO BRONCHOSCOPY WITH ENDOBRONCHIAL NAVIGATION;  Surgeon: [name], DO;  Location: [facility];  Service: Pulmonary;  Laterality: N/A;  ? VIDEO BRONCHOSCOPY WITH ENDOBRONCHIAL ULTRASOUND N/A 05/09/2020  ? Procedure: VIDEO BRONCHOSCOPY WITH ENDOBRONCHIAL ULTRASOUND;  Surgeon: [name], DO;  Location: [facility];  Service: Pulmonary;  Laterality: N/A;  ?  ? ?Current Meds  ?Medication Sig  ? Adagrasib 200 MG TABS Take 400 mg by mouth 2 (two) times daily.  ? apixaban (ELIQUIS) 5 MG TABS tablet Take 5 mg by mouth 2 (two) times daily.  ? aspirin 81 MG tablet Take 81 mg by mouth at bedtime.   ? cholecalciferol (VITAMIN D) 1000 UNITS tablet Take 1,000 Units by mouth at bedtime.   ? colchicine 0.6 MG tablet Take 1 tablet (0.6 mg total) by mouth every 8 (eight) hours as needed.  ? Cyanocobalamin (B-12 PO) Take 1,000 mcg by mouth daily.   ? diphenoxylate-atropine (LOMOTIL) 2.5-0.025 MG tablet TAKE 2 TABLETS BY MOUTH AT ONSET OF DIARRHEA THEN TAKE 1 TABLET AFTER EACH LOOSE STOOL MAX 8 TABLETS PER DAY  ? dronabinol (MARINOL) 5 MG capsule Take 1 capsule (5 mg total) by mouth 2 (two) times daily before lunch and supper.  ? folic acid (FOLVITE) 1 MG tablet TAKE ONE TABLET  BY MOUTH DAILY START 5 TO 7 DAYS BEFORE ALIMTA CHEMO AND CONTINUE UNTIL 21 DAYS AFTER COMPLETED  ? metoprolol succinate (TOPROL-XL) 25 MG 24 hr tablet Take 1 tablet (25 mg total) by mouth at bedtime.  ? nitroGLYCERIN (NITROSTAT) 0.4 MG SL tablet Place 1 tablet (0.4 mg total) under the tongue every 5 (five) minutes as needed for chest pain.  ? oxyCODONE-acetaminophen (PERCOCET) 7.5-325 MG tablet Take 1 tablet by mouth every 4 (four) hours as needed for severe pain.  ? rosuvastatin (CRESTOR) 40 MG tablet Take 1 tablet (40 mg total) by mouth at bedtime.  ? sacubitril-valsartan (ENTRESTO) 24-26 MG Take 1 tablet by mouth 2 (two) times daily.  ? sertraline (ZOLOFT) 50 MG tablet Take 50 mg by mouth in the  morning and at bedtime.   ? traMADol (ULTRAM) 50 MG tablet Take 1 tablet (50 mg total) by mouth every 6 (six) hours as needed.  ?  ? ?Allergies:   Amlodipine  ? ?Social History  ? ?Tobacco Use  ? Smoking status: Former  ?  Types: Cigarettes  ?  Quit date: 07/21/1983  ?  Years since quitting: 38.2  ? Smokeless tobacco: Former  ?Vaping Use  ? Vaping Use: Never used  ?Substance Use Topics  ? Alcohol use: Yes  ?  Alcohol/week: 20.0 - 28.0 standard drinks  ?  Types: 20 - 28 Standard drinks or equivalent per week  ?  Comment: daily - 20-28 /week  ? Drug use: No  ?  ? ?Family Hx: ?The patient's family history includes Cancer in his sister; Heart disease in his father; Heart failure in his father; Lung cancer in his father and mother. ? ?ROS:   ?Please see the history of present illness.    ? ?All other systems reviewed and are negative. ? ? ?Labs/Other Tests and Data Reviewed:   ? ?Recent Labs: ?05/04/2021: B Natriuretic Peptide 143.7 ?05/06/2021: Magnesium 2.1 ?06/20/2021: TSH 2.248 ?09/24/2021: ALT 21; BUN 17; Creatinine 0.98; Hemoglobin 12.9; Platelet Count 255; Potassium 4.4; Sodium 137  ? ?Recent Lipid Panel ?Lab Results  ?Component Value Date/Time  ? CHOL 145 04/08/2021 10:32 AM  ? TRIG 112 04/08/2021 10:32 AM  ? HDL 44  04/08/2021 10:32 AM  ? CHOLHDL 3.3 04/08/2021 10:32 AM  ? CHOLHDL 2.2 03/26/2016 10:09 AM  ? LDLCALC 81 04/08/2021 10:32 AM  ? ? ?Wt Readings from Last 3 Encounters:  ?10/08/21 176 lb 9.6 oz (80.1 kg)  ?03/13/2

## 2021-10-08 NOTE — Patient Instructions (Addendum)
Medication Instructions:  ?Your physician recommends that you continue on your current medications as directed. Please refer to the Current Medication list given to you today. ? ?*If you need a refill on your cardiac medications before your next appointment, please call your pharmacy* ? ? ?Lab Work: ?Come back between 7:15am and 5:00pm for fasting lab work on Friday March 24th ? ?If you have labs (blood work) drawn today and your tests are completely normal, you will receive your results only by: ?MyChart Message (if you have MyChart) OR ?A paper copy in the mail ?If you have any lab test that is abnormal or we need to change your treatment, we will call you to review the results. ? ?Follow-Up: ?At [facility], you and your health needs are our priority.  As part of our continuing mission to provide you with exceptional heart care, we have created designated Provider Care Teams.  These Care Teams include your primary Cardiologist (physician) and Advanced Practice Providers (APPs -  Physician Assistants and Nurse Practitioners) who all work together to provide you with the care you need, when you need it. ? ?Your next appointment:   ?1 year(s) ? ?The format for your next appointment:   ?In Person ? ?Provider:   ?Fransico Him, MD   ? ?

## 2021-10-08 NOTE — Addendum Note (Signed)
Addended by: Antonieta Iba on: 10/08/2021 08:40 AM ? ? Modules accepted: Orders ? ?

## 2021-10-09 ENCOUNTER — Ambulatory Visit
Admission: RE | Admit: 2021-10-09 | Discharge: 2021-10-09 | Disposition: A | Discharge status: Home / Self Care | Payer: Medicare Other | Source: Ambulatory Visit | Attending: Radiation Oncology | Admitting: Radiation Oncology

## 2021-10-09 ENCOUNTER — Other Ambulatory Visit: Payer: Self-pay

## 2021-10-09 DIAGNOSIS — C7951 Secondary malignant neoplasm of bone: Secondary | ICD-10-CM | POA: Diagnosis not present

## 2021-10-09 DIAGNOSIS — C3412 Malignant neoplasm of upper lobe, left bronchus or lung: Secondary | ICD-10-CM | POA: Diagnosis not present

## 2021-10-09 DIAGNOSIS — Z510 Encounter for antineoplastic radiation therapy: Secondary | ICD-10-CM | POA: Diagnosis not present

## 2021-10-09 DIAGNOSIS — C7972 Secondary malignant neoplasm of left adrenal gland: Secondary | ICD-10-CM | POA: Diagnosis not present

## 2021-10-09 DIAGNOSIS — C7971 Secondary malignant neoplasm of right adrenal gland: Secondary | ICD-10-CM | POA: Diagnosis not present

## 2021-10-09 NOTE — Telephone Encounter (Signed)
Oral Oncology Patient Advocate Encounter ? ?Received call from [place], nurse advocate with [facility] [employer] that patient has been denied enrollment into their program to receive Krazati from the drug manufacturer due to income limitations. ? ?Patient has the option to submit a hardship letter to the company to see if they will provide and exception based on his out of pocket expenses.     ? ?Patient knows to call the office with questions or concerns. ?  ?Oral Oncology Clinic will continue to follow. ? ?[name] CPHT ?Specialty Pharmacy Patient Advocate ?[place] ?Phone [phone] ?Fax [phone] ?10/09/2021 12:03 PM ? ? ?

## 2021-10-09 NOTE — Telephone Encounter (Signed)
Oral Oncology Patient Advocate Encounter ? ?Spoke to patients wife and coordinated with her by email to complete an application for [place] [employer] patient support program in an effort to reduce patient's out of pocket expense for Krazati to $0.   ? ?Application completed and faxed to [phone] on 10/07/21.  ? ?[place] [employer] patient assistance phone number for follow up is [phone].  ? ?This encounter will be updated until final determination.  ? ?[name] CPHT ?Specialty Pharmacy Patient Advocate ?[place] ?Phone [phone] ?Fax [phone] ? ? ? ?

## 2021-10-10 ENCOUNTER — Other Ambulatory Visit: Payer: Medicare Other | Admitting: *Deleted

## 2021-10-10 ENCOUNTER — Ambulatory Visit
Admission: RE | Admit: 2021-10-10 | Discharge: 2021-10-10 | Disposition: A | Discharge status: Home / Self Care | Payer: Medicare Other | Source: Ambulatory Visit | Attending: Radiation Oncology | Admitting: Radiation Oncology

## 2021-10-10 DIAGNOSIS — I420 Dilated cardiomyopathy: Secondary | ICD-10-CM | POA: Diagnosis not present

## 2021-10-10 DIAGNOSIS — C7972 Secondary malignant neoplasm of left adrenal gland: Secondary | ICD-10-CM | POA: Diagnosis not present

## 2021-10-10 DIAGNOSIS — I10 Essential (primary) hypertension: Secondary | ICD-10-CM | POA: Diagnosis not present

## 2021-10-10 DIAGNOSIS — I2699 Other pulmonary embolism without acute cor pulmonale: Secondary | ICD-10-CM | POA: Diagnosis not present

## 2021-10-10 DIAGNOSIS — E7800 Pure hypercholesterolemia, unspecified: Secondary | ICD-10-CM | POA: Diagnosis not present

## 2021-10-10 DIAGNOSIS — C7971 Secondary malignant neoplasm of right adrenal gland: Secondary | ICD-10-CM | POA: Diagnosis not present

## 2021-10-10 DIAGNOSIS — Z510 Encounter for antineoplastic radiation therapy: Secondary | ICD-10-CM | POA: Diagnosis not present

## 2021-10-10 DIAGNOSIS — C3412 Malignant neoplasm of upper lobe, left bronchus or lung: Secondary | ICD-10-CM | POA: Diagnosis not present

## 2021-10-10 DIAGNOSIS — I2510 Atherosclerotic heart disease of native coronary artery without angina pectoris: Secondary | ICD-10-CM | POA: Diagnosis not present

## 2021-10-10 DIAGNOSIS — C7951 Secondary malignant neoplasm of bone: Secondary | ICD-10-CM | POA: Diagnosis not present

## 2021-10-10 LAB — LIPID PANEL
Chol/HDL Ratio: 5.7 ratio — ABNORMAL HIGH (ref 0.0–5.0)
Cholesterol, Total: 210 mg/dL — ABNORMAL HIGH (ref 100–199)
HDL: 37 mg/dL — ABNORMAL LOW (ref >39)
LDL Chol Calc (NIH): 141 mg/dL — ABNORMAL HIGH (ref 0–99)
Triglycerides: 178 mg/dL — ABNORMAL HIGH (ref 0–149)
VLDL Cholesterol Cal: 32 mg/dL (ref 5–40)

## 2021-10-10 LAB — ALT: ALT: 20 IU/L (ref 0–44)

## 2021-10-13 ENCOUNTER — Ambulatory Visit
Admission: RE | Admit: 2021-10-13 | Discharge: 2021-10-13 | Disposition: A | Discharge status: Home / Self Care | Payer: Medicare Other | Source: Ambulatory Visit | Attending: Radiation Oncology | Admitting: Radiation Oncology

## 2021-10-13 ENCOUNTER — Inpatient Hospital Stay: Payer: Medicare Other | Admitting: Nutrition

## 2021-10-13 ENCOUNTER — Encounter: Payer: Self-pay | Admitting: *Deleted

## 2021-10-13 ENCOUNTER — Other Ambulatory Visit: Payer: Self-pay

## 2021-10-13 DIAGNOSIS — L439 Lichen planus, unspecified: Secondary | ICD-10-CM | POA: Diagnosis not present

## 2021-10-13 DIAGNOSIS — Z510 Encounter for antineoplastic radiation therapy: Secondary | ICD-10-CM | POA: Diagnosis not present

## 2021-10-13 DIAGNOSIS — C7971 Secondary malignant neoplasm of right adrenal gland: Secondary | ICD-10-CM | POA: Diagnosis not present

## 2021-10-13 DIAGNOSIS — I872 Venous insufficiency (chronic) (peripheral): Secondary | ICD-10-CM | POA: Diagnosis not present

## 2021-10-13 DIAGNOSIS — C7972 Secondary malignant neoplasm of left adrenal gland: Secondary | ICD-10-CM | POA: Diagnosis not present

## 2021-10-13 DIAGNOSIS — L570 Actinic keratosis: Secondary | ICD-10-CM | POA: Diagnosis not present

## 2021-10-13 DIAGNOSIS — C7951 Secondary malignant neoplasm of bone: Secondary | ICD-10-CM | POA: Diagnosis not present

## 2021-10-13 DIAGNOSIS — C3412 Malignant neoplasm of upper lobe, left bronchus or lung: Secondary | ICD-10-CM | POA: Diagnosis not present

## 2021-10-13 DIAGNOSIS — L821 Other seborrheic keratosis: Secondary | ICD-10-CM | POA: Diagnosis not present

## 2021-10-13 DIAGNOSIS — D225 Melanocytic nevi of trunk: Secondary | ICD-10-CM | POA: Diagnosis not present

## 2021-10-13 DIAGNOSIS — L814 Other melanin hyperpigmentation: Secondary | ICD-10-CM | POA: Diagnosis not present

## 2021-10-13 DIAGNOSIS — D492 Neoplasm of unspecified behavior of bone, soft tissue, and skin: Secondary | ICD-10-CM | POA: Diagnosis not present

## 2021-10-13 NOTE — Progress Notes (Signed)
76 year old male diagnosed with stage IV lung cancer in December 2021.  Patient is being followed by Dr. Marin Olp and Dr. Sondra Come.   ? ?Current Therapy:        ?Lumakras 360 mg po q day -- started on 07/31/2021 -- d/c on 02/07/203 for toxicity ?Xgeva every 3 months - due again 12/2021 ?Eliquis 5 mg PO BID ?Adagrasib 400 mg po BID -- start on 09/22/2021 ? ?He is receiving radiation therapy for spinal metastases. ? ?PMH includes CAD, Dyslipidemia, HTN, and OSA. ? ?Medications include Vitamin D, Vitamin B12, Lomotil, Folvite. Patient D/C Marinol. ? ?Labs include Glucose 125 on March 8. ? ?Height: 5" 11". ?Weight: 175.6 pounds. ?UBW: 199 lb Dec 2022. ?BMI:24.49. ? ?Nutrition Focused Physical exam showed mild depletion of fat and muscle stores.  ? ?12% weight loss over 3 months which is significant. ? ?Patient reports he is feeling much better today. His appetite has improved  and he is eating smaller portions of food. He tolerated oatmeal, berries, yogurt and granola this morning. He drinks 1 carton of Ensure Complete daily. He likes Strawberry. ?States diarrhea resolved after medication change. His appetite improved after 4 days of Marinol so he discontinued. ?Weight stable the past 2 weeks. ? ?Nutrition Diagnosis: ?Inadequate oral intake related to metastatic lung cancer as evidenced by percent weight loss. ? ?Intervention: ?Educated to consume higher calorie, higher protein foods and small frequent meals and snacks.  Continue adequate fluid intake. ?Bowel regimen as needed. ?Take nausea medication as needed to minimize nausea. ?Continue ADLs for improved quality of life. ?Nutrition facts sheets provided. ?Consume 1-2 cartons of Ensure high-protein daily. ?Provided 1 complementary case of strawberry Ensure high-protein. ? ?Monitoring, Evaluation, Goals: ?Patient will tolerate adequate calories and protein to minimize further weight loss. ? ?Next Visit: ?Patient to contact RD with questions or concerns. ? ?**Disclaimer:  This note was dictated with voice recognition software. Similar sounding words can inadvertently be transcribed and this note may contain transcription errors which may not have been corrected upon publication of note.** ? ?

## 2021-10-14 ENCOUNTER — Ambulatory Visit
Admission: RE | Admit: 2021-10-14 | Discharge: 2021-10-14 | Disposition: A | Discharge status: Home / Self Care | Payer: Medicare Other | Source: Ambulatory Visit | Attending: Radiation Oncology | Admitting: Radiation Oncology

## 2021-10-14 ENCOUNTER — Telehealth: Payer: Self-pay

## 2021-10-14 ENCOUNTER — Other Ambulatory Visit: Payer: Self-pay

## 2021-10-14 ENCOUNTER — Other Ambulatory Visit (HOSPITAL_COMMUNITY): Payer: Self-pay

## 2021-10-14 DIAGNOSIS — Z510 Encounter for antineoplastic radiation therapy: Secondary | ICD-10-CM | POA: Diagnosis not present

## 2021-10-14 DIAGNOSIS — C7971 Secondary malignant neoplasm of right adrenal gland: Secondary | ICD-10-CM | POA: Diagnosis not present

## 2021-10-14 DIAGNOSIS — C7951 Secondary malignant neoplasm of bone: Secondary | ICD-10-CM | POA: Diagnosis not present

## 2021-10-14 DIAGNOSIS — C3412 Malignant neoplasm of upper lobe, left bronchus or lung: Secondary | ICD-10-CM | POA: Diagnosis not present

## 2021-10-14 DIAGNOSIS — I2510 Atherosclerotic heart disease of native coronary artery without angina pectoris: Secondary | ICD-10-CM

## 2021-10-14 DIAGNOSIS — C7972 Secondary malignant neoplasm of left adrenal gland: Secondary | ICD-10-CM | POA: Diagnosis not present

## 2021-10-14 DIAGNOSIS — E7800 Pure hypercholesterolemia, unspecified: Secondary | ICD-10-CM

## 2021-10-14 MED ORDER — APIXABAN 5 MG PO TABS
5.0000 mg | ORAL_TABLET | Freq: Two times a day (BID) | ORAL | 0 refills | Status: DC
  Filled 2021-10-14: qty 60, 30d supply, fill #0

## 2021-10-14 NOTE — Telephone Encounter (Signed)
Faxed hardship letter to [employer].  Emailed patients letter to [email or web address]. ? ?[name] CPHT ?Specialty Pharmacy Patient Advocate ?[place] ?Phone [phone] ?Fax [phone] ?10/14/2021 4:27 PM ? ?

## 2021-10-14 NOTE — Telephone Encounter (Signed)
Patient has been adherent to his rosuvastatin 40 mg daily. He states that his oncologist took him off of his zetia but he is unsure why. He also reports that since he has been going through cancer treatment he lost a lot of weight because he did not have an appetite. He states that over the past several months his oncologist has encouraged him to start eating whatever he could so that he could gain weight back. He states that he has been able to eat more but it has not been healthy food. He states that he has been eating a lot of sweets as well. Patient states that at this time his quality of life if most important to him. He does not want to be seen by PharmD right now. He is willing to try and work on still eating enough food but making healthy choices. States that he would also be willing to restart zetia if his oncologist was okay with it.  ?

## 2021-10-14 NOTE — Telephone Encounter (Signed)
Refill request for Adagrasib to be sent to [facility] at the request of the pharmacist. Pt will be at [facility] today for radiation and would like to receive Rx there.  ?

## 2021-10-14 NOTE — Telephone Encounter (Signed)
Message has been sent to oncology to see if they are okay with the patient restarting zetia.  ?

## 2021-10-14 NOTE — Telephone Encounter (Signed)
-----   Message from Leeroy Bock, [place] sent at 10/14/2021  9:47 AM EDT ----- ?Would clarify if pt is adherent to rosuvastatin 40mg  daily, his LDL was previously much lower at 67-81, unusual that it would have increased to 141 with no medication change. If he has been adherent to rosuvastatin, please schedule him visit with PharmD to discuss addition of PCSK9i therapy. If he has not been adherent, he should resume his rosuvastatin and have lipids rechecked in 6-8 weeks. ?

## 2021-10-15 ENCOUNTER — Other Ambulatory Visit: Payer: Self-pay

## 2021-10-15 ENCOUNTER — Ambulatory Visit
Admission: RE | Admit: 2021-10-15 | Discharge: 2021-10-15 | Disposition: A | Discharge status: Home / Self Care | Payer: Medicare Other | Source: Ambulatory Visit | Attending: Radiation Oncology | Admitting: Radiation Oncology

## 2021-10-15 DIAGNOSIS — C7971 Secondary malignant neoplasm of right adrenal gland: Secondary | ICD-10-CM | POA: Diagnosis not present

## 2021-10-15 DIAGNOSIS — C7972 Secondary malignant neoplasm of left adrenal gland: Secondary | ICD-10-CM | POA: Diagnosis not present

## 2021-10-15 DIAGNOSIS — C3412 Malignant neoplasm of upper lobe, left bronchus or lung: Secondary | ICD-10-CM | POA: Diagnosis not present

## 2021-10-15 DIAGNOSIS — Z510 Encounter for antineoplastic radiation therapy: Secondary | ICD-10-CM | POA: Diagnosis not present

## 2021-10-15 DIAGNOSIS — C7951 Secondary malignant neoplasm of bone: Secondary | ICD-10-CM | POA: Diagnosis not present

## 2021-10-15 MED ORDER — EZETIMIBE 10 MG PO TABS: 10.0000 mg | ORAL_TABLET | Freq: Every day | ORAL | 3 refills | Status: DC

## 2021-10-15 NOTE — Telephone Encounter (Signed)
Left message for patient advising him to restart zetia 10 mg daily. Lab work scheduled for 5/30. Advised to call back with any questions.  ?

## 2021-10-15 NOTE — Telephone Encounter (Signed)
John Napoleon, MD  Antonieta Iba, RN ?Please restart the Zetia.  Pete  ?

## 2021-10-16 ENCOUNTER — Other Ambulatory Visit: Payer: Self-pay

## 2021-10-16 ENCOUNTER — Ambulatory Visit
Admission: RE | Admit: 2021-10-16 | Discharge: 2021-10-16 | Disposition: A | Discharge status: Home / Self Care | Payer: Medicare Other | Source: Ambulatory Visit | Attending: Radiation Oncology | Admitting: Radiation Oncology

## 2021-10-16 DIAGNOSIS — C3412 Malignant neoplasm of upper lobe, left bronchus or lung: Secondary | ICD-10-CM | POA: Diagnosis not present

## 2021-10-16 DIAGNOSIS — C7972 Secondary malignant neoplasm of left adrenal gland: Secondary | ICD-10-CM | POA: Diagnosis not present

## 2021-10-16 DIAGNOSIS — C7951 Secondary malignant neoplasm of bone: Secondary | ICD-10-CM | POA: Diagnosis not present

## 2021-10-16 DIAGNOSIS — C7971 Secondary malignant neoplasm of right adrenal gland: Secondary | ICD-10-CM | POA: Diagnosis not present

## 2021-10-16 DIAGNOSIS — Z510 Encounter for antineoplastic radiation therapy: Secondary | ICD-10-CM | POA: Diagnosis not present

## 2021-10-17 ENCOUNTER — Ambulatory Visit
Admission: RE | Admit: 2021-10-17 | Discharge: 2021-10-17 | Disposition: A | Discharge status: Home / Self Care | Payer: Medicare Other | Source: Ambulatory Visit | Attending: Radiation Oncology | Admitting: Radiation Oncology

## 2021-10-17 DIAGNOSIS — C3412 Malignant neoplasm of upper lobe, left bronchus or lung: Secondary | ICD-10-CM | POA: Diagnosis not present

## 2021-10-17 DIAGNOSIS — C7972 Secondary malignant neoplasm of left adrenal gland: Secondary | ICD-10-CM | POA: Diagnosis not present

## 2021-10-17 DIAGNOSIS — Z510 Encounter for antineoplastic radiation therapy: Secondary | ICD-10-CM | POA: Diagnosis not present

## 2021-10-17 DIAGNOSIS — C7971 Secondary malignant neoplasm of right adrenal gland: Secondary | ICD-10-CM | POA: Diagnosis not present

## 2021-10-17 DIAGNOSIS — C7951 Secondary malignant neoplasm of bone: Secondary | ICD-10-CM | POA: Diagnosis not present

## 2021-10-17 NOTE — Telephone Encounter (Signed)
Spoke to patients wife on 3/29 to let her know hardship letter was faxed.  She stated the patients free trial he received is enough for 3 months due to reduced dosing.  Patient has about 2 months worth of med on hand currently and will wait for a determination from the appeal with the manufacturer. ? ?[name] CPHT ?Specialty Pharmacy Patient Advocate ?[place] ?Phone [phone] ?Fax [phone] ?10/17/2021 12:52 PM ? ?

## 2021-10-20 ENCOUNTER — Ambulatory Visit
Admission: RE | Admit: 2021-10-20 | Discharge: 2021-10-20 | Disposition: A | Discharge status: Home / Self Care | Payer: Medicare Other | Source: Ambulatory Visit | Attending: Radiation Oncology | Admitting: Radiation Oncology

## 2021-10-20 ENCOUNTER — Other Ambulatory Visit: Payer: Self-pay

## 2021-10-20 ENCOUNTER — Encounter: Payer: Self-pay | Admitting: Radiation Oncology

## 2021-10-20 DIAGNOSIS — C3412 Malignant neoplasm of upper lobe, left bronchus or lung: Secondary | ICD-10-CM | POA: Insufficient documentation

## 2021-10-20 DIAGNOSIS — C7951 Secondary malignant neoplasm of bone: Secondary | ICD-10-CM | POA: Insufficient documentation

## 2021-10-20 DIAGNOSIS — C7972 Secondary malignant neoplasm of left adrenal gland: Secondary | ICD-10-CM | POA: Insufficient documentation

## 2021-10-20 DIAGNOSIS — Z510 Encounter for antineoplastic radiation therapy: Secondary | ICD-10-CM | POA: Insufficient documentation

## 2021-10-20 DIAGNOSIS — C7971 Secondary malignant neoplasm of right adrenal gland: Secondary | ICD-10-CM | POA: Diagnosis not present

## 2021-10-22 NOTE — Telephone Encounter (Signed)
Called [employer] and me to check the status of the appeal that was submitted.  Rep stated that the information had been forwarded to the manufacturer and they had not received a response yet. Nurse for the case, [name], will be in touch regarding any information she receives.  ? ?[name] CPHT ?Specialty Pharmacy Patient Advocate ?[name] ?Phone [phone] ?Fax [phone] ?10/22/2021 9:44 AM ? ?

## 2021-10-23 ENCOUNTER — Other Ambulatory Visit (HOSPITAL_COMMUNITY): Payer: Self-pay

## 2021-10-24 ENCOUNTER — Inpatient Hospital Stay (HOSPITAL_BASED_OUTPATIENT_CLINIC_OR_DEPARTMENT_OTHER): Payer: Medicare Other | Admitting: Hematology & Oncology

## 2021-10-24 ENCOUNTER — Inpatient Hospital Stay: Payer: Medicare Other | Attending: Hematology & Oncology

## 2021-10-24 ENCOUNTER — Other Ambulatory Visit: Payer: Self-pay

## 2021-10-24 ENCOUNTER — Other Ambulatory Visit: Payer: Self-pay | Admitting: Radiation Oncology

## 2021-10-24 ENCOUNTER — Encounter: Payer: Self-pay | Admitting: Hematology & Oncology

## 2021-10-24 ENCOUNTER — Encounter: Payer: Self-pay | Admitting: Radiation Oncology

## 2021-10-24 VITALS — BP 112/56 | HR 61 | Temp 97.8°F | Resp 18 | Ht 71.0 in | Wt 177.0 lb

## 2021-10-24 DIAGNOSIS — M109 Gout, unspecified: Secondary | ICD-10-CM | POA: Insufficient documentation

## 2021-10-24 DIAGNOSIS — C3492 Malignant neoplasm of unspecified part of left bronchus or lung: Secondary | ICD-10-CM

## 2021-10-24 DIAGNOSIS — Z7901 Long term (current) use of anticoagulants: Secondary | ICD-10-CM | POA: Insufficient documentation

## 2021-10-24 DIAGNOSIS — C3412 Malignant neoplasm of upper lobe, left bronchus or lung: Secondary | ICD-10-CM | POA: Insufficient documentation

## 2021-10-24 DIAGNOSIS — C7951 Secondary malignant neoplasm of bone: Secondary | ICD-10-CM | POA: Diagnosis not present

## 2021-10-24 DIAGNOSIS — C7971 Secondary malignant neoplasm of right adrenal gland: Secondary | ICD-10-CM | POA: Insufficient documentation

## 2021-10-24 DIAGNOSIS — C7972 Secondary malignant neoplasm of left adrenal gland: Secondary | ICD-10-CM | POA: Insufficient documentation

## 2021-10-24 DIAGNOSIS — R1310 Dysphagia, unspecified: Secondary | ICD-10-CM | POA: Diagnosis not present

## 2021-10-24 LAB — CBC WITH DIFFERENTIAL (CANCER CENTER ONLY)
Abs Immature Granulocytes: 0.01 10*3/uL (ref 0.00–0.07)
Basophils Absolute: 0 10*3/uL (ref 0.0–0.1)
Basophils Relative: 0 %
Eosinophils Absolute: 0.1 10*3/uL (ref 0.0–0.5)
Eosinophils Relative: 3 %
HCT: 34.8 % — ABNORMAL LOW (ref 39.0–52.0)
Hemoglobin: 11.5 g/dL — ABNORMAL LOW (ref 13.0–17.0)
Immature Granulocytes: 0 %
Lymphocytes Relative: 3 %
Lymphs Abs: 0.1 10*3/uL — ABNORMAL LOW (ref 0.7–4.0)
MCH: 32.2 pg (ref 26.0–34.0)
MCHC: 33 g/dL (ref 30.0–36.0)
MCV: 97.5 fL (ref 80.0–100.0)
Monocytes Absolute: 0.4 10*3/uL (ref 0.1–1.0)
Monocytes Relative: 10 %
Neutro Abs: 3.4 10*3/uL (ref 1.7–7.7)
Neutrophils Relative %: 84 %
Platelet Count: 98 10*3/uL — ABNORMAL LOW (ref 150–400)
RBC: 3.57 MIL/uL — ABNORMAL LOW (ref 4.22–5.81)
RDW: 15.1 % (ref 11.5–15.5)
WBC Count: 4.1 10*3/uL (ref 4.0–10.5)
nRBC: 0 % (ref 0.0–0.2)

## 2021-10-24 LAB — CMP (CANCER CENTER ONLY)
ALT: 10 U/L (ref 0–44)
AST: 16 U/L (ref 15–41)
Albumin: 4.2 g/dL (ref 3.5–5.0)
Alkaline Phosphatase: 77 U/L (ref 38–126)
Anion gap: 8 (ref 5–15)
BUN: 12 mg/dL (ref 8–23)
CO2: 26 mmol/L (ref 22–32)
Calcium: 8.8 mg/dL — ABNORMAL LOW (ref 8.9–10.3)
Chloride: 106 mmol/L (ref 98–111)
Creatinine: 0.76 mg/dL (ref 0.61–1.24)
GFR, Estimated: >60 mL/min (ref >60)
Glucose, Bld: 100 mg/dL — ABNORMAL HIGH (ref 70–99)
Potassium: 3.6 mmol/L (ref 3.5–5.1)
Sodium: 140 mmol/L (ref 135–145)
Total Bilirubin: 0.5 mg/dL (ref 0.3–1.2)
Total Protein: 6.8 g/dL (ref 6.5–8.1)

## 2021-10-24 LAB — LACTATE DEHYDROGENASE: LDH: 104 U/L (ref 98–192)

## 2021-10-24 MED ORDER — SUCRALFATE 1 G PO TABS: 1.0000 g | ORAL_TABLET | Freq: Four times a day (QID) | ORAL | 2 refills | Status: DC

## 2021-10-24 NOTE — Progress Notes (Signed)
?Hematology and Oncology Follow Up Visit ? ?John Hinton ?[id number] ?10-15-1945 76 y.o. ?10/24/2021 ? ? 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 ?Past Therapy: ?S/P cycle 6 of Carbo/Alimta/Pembrolizumab ?Alimta/pembrolizumab-maintenance therapy s/p cycle 3/4  - start on 11/28/2020 --Alimta discontinued after cycle 2 ?  ?Current Therapy:        ?Lumakras 360 mg po q day -- started on 07/31/2021 -- d/c on 02/07/203 for toxicity ?Xgeva every 3 months - due again 12/2021 ?Eliquis 5 mg PO BID ?Adagrasib 400 mg po BID -- start on 09/22/2021 ?  ?Interim History:  John Hinton is here today for follow-up.  He is doing pretty well.  I saw him at the [employer] [place] earlier this week.  He had a wonderful time.  He was there with his wife. ? ?His main problem right now is odynophagia and dysphagia.  He says he is having a hard time swallowing.  This might be from when he had radiation therapy to a lesion.  He was given some Carafate by Radiation Oncology.  He has not yet started this. ? ?He has had problems with diarrhea.  This seems to be under better control by using the Lomotil. ? ?Looks like he has gained weight.  His appetite is doing better now that he is on Marinol. ? ?He has had no rashes.  There is been no leg swelling. ? ?Steroids really helped his gout.  Also helped him feel better. ? ?He has had no bleeding.  He has had no headache. ? ?Overall, I would say his performance status is ECOG 1.     ? ?Medications:  ?Allergies as of 10/24/2021   ? ?   Reactions  ? Amlodipine Swelling  ? Swelling of ankles  ? ?  ? ?  ?Medication List  ?  ? ?  ? Accurate as of October 24, 2021  3:37 PM. If you have any questions, ask your nurse or doctor.  ?  ?  ? ?  ? ?Adagrasib 200  MG Tabs ?Take 400 mg by mouth 2 (two) times daily. ?  ?apixaban 5 MG Tabs tablet ?Commonly known as: ELIQUIS ?Take 1 tablet by mouth 2 times daily. ?  ?aspirin 81 MG tablet ?Take 81 mg by mouth at bedtime. ?  ?B-12 PO ?Take 1,000 mcg by mouth daily. ?  ?cholecalciferol 1000 units tablet ?Commonly known as: VITAMIN D ?Take 1,000 Units by mouth at bedtime. ?  ?colchicine 0.6 MG tablet ?Take 1 tablet (0.6 mg total) by mouth every 8 (eight) hours as needed. ?  ?diphenoxylate-atropine 2.5-0.025 MG tablet ?Commonly known as: LOMOTIL ?TAKE 2 TABLETS BY MOUTH AT ONSET OF DIARRHEA THEN TAKE 1 TABLET AFTER EACH LOOSE STOOL MAX 8 TABLETS PER DAY ?  ?dronabinol 5 MG capsule ?Commonly known as: MARINOL ?Take 1 capsule (5 mg total) by mouth 2 (two) times daily before lunch and supper. ?  ?Entresto 24-26 MG ?Generic drug: sacubitril-valsartan ?Take 1 tablet by mouth 2 (two) times daily. ?  ?ezetimibe 10 MG tablet ?Commonly known as: ZETIA ?Take 1 tablet (10 mg total) by mouth daily. ?  ?folic acid 1 MG tablet ?Commonly known as: FOLVITE ?TAKE ONE TABLET BY MOUTH DAILY START 5 TO 7 DAYS BEFORE ALIMTA CHEMO  AND CONTINUE UNTIL 21 DAYS AFTER COMPLETED ?  ?metoprolol succinate 25 MG 24 hr tablet ?Commonly known as: TOPROL-XL ?Take 1 tablet (25 mg total) by mouth at bedtime. ?  ?nitroGLYCERIN 0.4 MG SL tablet ?Commonly known as: NITROSTAT ?Place 1 tablet (0.4 mg total) under the tongue every 5 (five) minutes as needed for chest pain. ?  ?oxyCODONE-acetaminophen 7.5-325 MG tablet ?Commonly known as: Percocet ?Take 1 tablet by mouth every 4 (four) hours as needed for severe pain. ?  ?rosuvastatin 40 MG tablet ?Commonly known as: CRESTOR ?Take 1 tablet (40 mg total) by mouth at bedtime. ?  ?sertraline 50 MG tablet ?Commonly known as: ZOLOFT ?Take 50 mg by mouth in the morning and at bedtime. ?  ?sertraline 100 MG tablet ?Commonly known as: ZOLOFT ?Take 100 mg by mouth 2 (two) times daily. ?  ?sucralfate 1 g tablet ?Commonly known as:  Carafate ?Take 1 tablet (1 g total) by mouth 4 (four) times daily. Dissolve each tablet in 15 cc water before use. ?Started by: Kyung Rudd, MD ?  ?traMADol 50 MG tablet ?Commonly known as: ULTRAM ?Take 1 tablet (50 mg total) by mouth every 6 (six) hours as needed. ?  ? ?  ? ? ?Allergies:  ?Allergies  ?Allergen Reactions  ? Amlodipine Swelling  ?  Swelling of ankles  ? ? ?Past Medical History, Surgical history, Social history, and Family History were reviewed and updated. ? ?Review of Systems: ?Review of Systems  ?Constitutional: Negative.   ?HENT: Negative.    ?Eyes: Negative.   ?Respiratory: Negative.    ?Cardiovascular: Negative.   ?Gastrointestinal: Negative.   ?Genitourinary: Negative.   ?Musculoskeletal: Negative.   ?Skin: Negative.   ?Neurological: Negative.   ?Endo/Heme/Allergies: Negative.   ?Psychiatric/Behavioral: Negative.    ? ? ?Physical Exam: ? height is '5\' 11"'  (1.803 m) and weight is 177 lb (80.3 kg). His oral temperature is 97.8 ?F (36.6 ?C). His blood pressure is 112/56 (abnormal) and his pulse is 61. His respiration is 18 and oxygen saturation is 100%.  ? ?Wt Readings from Last 3 Encounters:  ?10/24/21 177 lb (80.3 kg)  ?10/13/21 175 lb 9.6 oz (79.7 kg)  ?10/08/21 176 lb 9.6 oz (80.1 kg)  ? ? ?Physical Exam ?Vitals reviewed.  ?HENT:  ?   Head: Normocephalic and atraumatic.  ?Eyes:  ?   Pupils: Pupils are equal, round, and reactive to light.  ?Cardiovascular:  ?   Rate and Rhythm: Normal rate and regular rhythm.  ?   Heart sounds: Normal heart sounds.  ?Pulmonary:  ?   Effort: Pulmonary effort is normal.  ?   Breath sounds: Normal breath sounds.  ?Abdominal:  ?   General: Bowel sounds are normal.  ?   Palpations: Abdomen is soft.  ?Musculoskeletal:     ?   General: No tenderness or deformity. Normal range of motion.  ?   Cervical back: Normal range of motion.  ?Lymphadenopathy:  ?   Cervical: No cervical adenopathy.  ?Skin: ?   General: Skin is warm and dry.  ?   Findings: No erythema or rash.   ?Neurological:  ?   Mental Status: He is alert and oriented to person, place, and time.  ?Psychiatric:     ?   Behavior: Behavior normal.     ?   Thought Content: Thought content normal.     ?   Judgment: Judgment normal.  ? ? ?Lab Results  ?Component Value Date  ? WBC 4.1 10/24/2021  ?  HGB 11.5 (L) 10/24/2021  ? HCT 34.8 (L) 10/24/2021  ? MCV 97.5 10/24/2021  ? PLT 98 (L) 10/24/2021  ? ?Lab Results  ?Component Value Date  ? FERRITIN 758 (H) 10/25/2020  ? IRON 69 10/25/2020  ? TIBC 321 10/25/2020  ? UIBC 252 10/25/2020  ? IRONPCTSAT 22 10/25/2020  ? ?Lab Results  ?Component Value Date  ? RBC 3.57 (L) 10/24/2021  ? ?No results found for: KPAFRELGTCHN, LAMBDASER, KAPLAMBRATIO ?No results found for: IGGSERUM, IGA, IGMSERUM ?No results found for: TOTALPROTELP, ALBUMINELP, A1GS, A2GS, BETS, BETA2SER, GAMS, MSPIKE, SPEI ?  Chemistry   ?   ?Component Value Date/Time  ? NA 140 10/24/2021 1501  ? NA 142 06/19/2021 1032  ? K 3.6 10/24/2021 1501  ? CL 106 10/24/2021 1501  ? CO2 26 10/24/2021 1501  ? BUN 12 10/24/2021 1501  ? BUN 12 06/19/2021 1032  ? CREATININE 0.76 10/24/2021 1501  ? CREATININE 1.01 03/26/2016 1009  ?    ?Component Value Date/Time  ? CALCIUM 8.8 (L) 10/24/2021 1501  ? ALKPHOS 77 10/24/2021 1501  ? AST 16 10/24/2021 1501  ? ALT 10 10/24/2021 1501  ? BILITOT 0.5 10/24/2021 1501  ?  ? ? ? ?Impression and Plan: Mr. Lacock is a very pleasant 76 yo caucasian male with an atypical stage IV lung cancer which was previously resected followed by systemic chemotherapy and maintenance pembrolizumab.  ? ?We now have him on adagrasib.  I think is doing quite well with the adagrasib.  His liver tests look great.  His weight is up. ? ?Again, my problem or my concern is that his swallowing.  Hopefully, the Carafate will help.  He just completed radiation therapy earlier this week on Monday so it may be a little bit until he has resolution of some radiation esophagitis. ? ?We will call him on Wednesday or Thursday and see  how he is doing with swallowing.  If he is doing no better, he will need to be scoped and may be stented or dilated. ? ?We will plan to do another PET scan on him in early May.  By then, he would have been on

## 2021-10-27 ENCOUNTER — Encounter: Payer: Self-pay | Admitting: *Deleted

## 2021-10-27 NOTE — Progress Notes (Signed)
Patient will need a PET prior to his next MD appointment. Scheduled for 11/19/21. ? ?Patient's wife, Pamala Hurry, is aware of PET scan including date, time and location. PET prep also reviewed. Radiology information sheet also mailed to patient home.  ? ?Oncology Nurse Navigator Documentation ? ? ?  10/27/2021  ?  8:15 AM  ?Oncology Nurse Navigator Flowsheets  ?Navigator Follow Up Date: 11/19/2021  ?Navigator Follow Up Reason: Scan Review  ?Navigator Location [facility]  ?Navigator Encounter Type Appt/Treatment Plan Review;Telephone  ?Telephone Appt Confirmation/Clarification;Education;Outgoing Call  ?Patient Visit Type MedOnc  ?Treatment Phase Active Tx  ?Barriers/Navigation Needs Coordination of Care;Education  ?Education Other  ?Interventions Coordination of Care;Education  ?Acuity Level 2-Minimal Needs (1-2 Barriers Identified)  ?Coordination of Care Radiology  ?Education Method Verbal;Written  ?Support Groups/Services Friends and Family  ?Time Spent with Patient 30  ?  ?

## 2021-10-30 ENCOUNTER — Telehealth: Payer: Self-pay

## 2021-10-30 ENCOUNTER — Telehealth: Payer: Self-pay | Admitting: *Deleted

## 2021-10-30 DIAGNOSIS — Z20822 Contact with and (suspected) exposure to covid-19: Secondary | ICD-10-CM | POA: Diagnosis not present

## 2021-10-30 NOTE — Telephone Encounter (Signed)
Appointment made with patient  ?

## 2021-10-30 NOTE — Telephone Encounter (Signed)
Appointment made with patient for an office visit with Dr. Bryan Lemma on 11/06/21 at 8.40am. Address provided. Patient verbalized understanding. ?

## 2021-10-30 NOTE — Telephone Encounter (Signed)
Call placed to patient to check on his swallowing per order of Dr. Marin Olp.  Pt.'s wife states that pt.'s swallowing has "improved a little bit."  She states that pt is able to take Ensure and soft foods such as mashed avocado and eggs and is taking Carafate QID.  Dr. Marin Olp notified. Call placed back to patient's wife and patient's wife notified per order of Dr. Marin Olp that Dr. Vivia Ewing office would be contacting them for a possible endoscopy.  Pt.'s wife is appreciative of call and has no questions at this time. ? ? ?

## 2021-11-03 ENCOUNTER — Encounter: Payer: Self-pay | Admitting: *Deleted

## 2021-11-03 ENCOUNTER — Other Ambulatory Visit: Payer: Self-pay | Admitting: Radiation Oncology

## 2021-11-03 MED ORDER — LIDOCAINE VISCOUS HCL 2 % MT SOLN: 15.0000 mL | OROMUCOSAL | 1 refills | Status: DC | PRN

## 2021-11-03 NOTE — Telephone Encounter (Signed)
Called [employer] to check the appeal status for patient.  Beth, rep, stated she will email the manufacturer to ask for an update and call back to let me know.   ? ?[name] CPHT ?Specialty Pharmacy Patient Advocate ?[place] ?Phone [phone] ?Fax [phone] ?11/03/2021 10:03 AM ? ?

## 2021-11-03 NOTE — Telephone Encounter (Signed)
This encounter was created in error - please disregard.

## 2021-11-04 ENCOUNTER — Other Ambulatory Visit: Payer: Self-pay

## 2021-11-04 ENCOUNTER — Encounter: Payer: Self-pay | Admitting: Cardiology

## 2021-11-04 ENCOUNTER — Encounter: Payer: Self-pay | Admitting: Hematology & Oncology

## 2021-11-04 DIAGNOSIS — C3492 Malignant neoplasm of unspecified part of left bronchus or lung: Secondary | ICD-10-CM

## 2021-11-04 MED ORDER — APIXABAN 5 MG PO TABS: 5.0000 mg | ORAL_TABLET | Freq: Two times a day (BID) | ORAL | 3 refills | Status: DC

## 2021-11-04 NOTE — Telephone Encounter (Signed)
Notified patient of lidocaine sent to pharmacy and explained dilution instructions. Advised patient to call back if this doesn't provide relief. Patient verbalized understanding. ?

## 2021-11-05 NOTE — Telephone Encounter (Signed)
Oral Oncology Patient Advocate Encounter ? ?Received fax notification that patient has been approved for [employer] patient assistance from 11/03/21-07/19/22. ? ?[name] CPHT ?Specialty Pharmacy Patient Advocate ?[place] ?Phone [phone] ?Fax [phone] ?11/05/2021 11:51 AM ? ?

## 2021-11-06 ENCOUNTER — Ambulatory Visit (INDEPENDENT_AMBULATORY_CARE_PROVIDER_SITE_OTHER): Payer: Medicare Other | Admitting: Gastroenterology

## 2021-11-06 ENCOUNTER — Encounter: Payer: Self-pay | Admitting: Gastroenterology

## 2021-11-06 VITALS — BP 130/80 | HR 58 | Ht 71.0 in | Wt 175.5 lb

## 2021-11-06 DIAGNOSIS — I2510 Atherosclerotic heart disease of native coronary artery without angina pectoris: Secondary | ICD-10-CM | POA: Diagnosis not present

## 2021-11-06 DIAGNOSIS — Z902 Acquired absence of lung [part of]: Secondary | ICD-10-CM | POA: Diagnosis not present

## 2021-11-06 DIAGNOSIS — C3492 Malignant neoplasm of unspecified part of left bronchus or lung: Secondary | ICD-10-CM | POA: Diagnosis not present

## 2021-11-06 DIAGNOSIS — I420 Dilated cardiomyopathy: Secondary | ICD-10-CM | POA: Diagnosis not present

## 2021-11-06 DIAGNOSIS — R1310 Dysphagia, unspecified: Secondary | ICD-10-CM | POA: Diagnosis not present

## 2021-11-06 DIAGNOSIS — Z7901 Long term (current) use of anticoagulants: Secondary | ICD-10-CM

## 2021-11-06 DIAGNOSIS — G4733 Obstructive sleep apnea (adult) (pediatric): Secondary | ICD-10-CM | POA: Diagnosis not present

## 2021-11-06 DIAGNOSIS — I10 Essential (primary) hypertension: Secondary | ICD-10-CM

## 2021-11-06 NOTE — Progress Notes (Signed)
? ? ?          ?Chief Complaint: Dysphagia, odynophagia ? ? ?Referring Provider:     Burney Gauze, MD ? ? ? ?HPI:   ? ? ?John Hinton is a 76 y.o. male with a history of stage IV LUL adenocarcinoma  s/p resection then progression with metastasis to right lung, bilateral adrenal glands, bony metastasis, treated with radiation (completed 10/20/2021) and chemotherapy (currently on adagrasib maintenance).  Additionally, history of submassive PE in 04/2021 (on Eliquis and ASA), CAD  s/p CABG 2007 then PCI of SVG to OM1 followed by balloon angioplasty in 01/2017 for focal in-stent restenosis, dilated cardiomyopathy with EF 45-50%, dyslipidemia, HTN, OSA (on CPAP), referred to the Gastroenterology Clinic for evaluation of odynophagia and dysphagia. ? ?Symptoms started at the end of radiation therapy (completed radiation on 10/20/2021).  Points to anterior neck/suprasternal notch.  Was eventually having difficulty tolerating secretions and even swallowing the air was painful. ? ?Was seen by Dr. Marin Olp on 10/24/2021.  Was started on Carafate QID for his odynophagia/dysphagia.  He was also started on viscous lidocaine AC&HS by Dr. Sondra Come, and over the last week or so has noticed improvement.  He is now tolerating solid foods, to include red meat.  Has been using Ensure to increase caloric intake as well.  He is also sleeping better as he is tolerating secretions.  Still with occasional episodes of globus sensation, but again symptoms are much improved. ? ?No fever, nausea, vomiting, hematemesis, melena.  ? ?He was last seen in the Cardiology Clinic on 10/08/2021 with plan for 1 year follow-up.  Last TTE was 04/2021. ? ? ? ?  Latest Ref Rng & Units 10/24/2021  ?  3:01 PM 09/24/2021  ?  3:11 PM 09/03/2021  ?  9:58 AM  ?CBC  ?WBC 4.0 - 10.5 K/uL 4.1   12.3   13.9    ?Hemoglobin 13.0 - 17.0 g/dL 11.5   12.9   11.8    ?Hematocrit 39.0 - 52.0 % 34.8   39.0   35.8    ?Platelets 150 - 400 K/uL 98   255   238    ? ? ?  Latest Ref Rng &  Units 10/24/2021  ?  3:01 PM 10/10/2021  ?  9:24 AM 09/24/2021  ?  3:11 PM  ?CMP  ?Glucose 70 - 99 mg/dL 100    125    ?BUN 8 - 23 mg/dL 12    17    ?Creatinine 0.61 - 1.24 mg/dL 0.76    0.98    ?Sodium 135 - 145 mmol/L 140    137    ?Potassium 3.5 - 5.1 mmol/L 3.6    4.4    ?Chloride 98 - 111 mmol/L 106    101    ?CO2 22 - 32 mmol/L 26    27    ?Calcium 8.9 - 10.3 mg/dL 8.8    9.3    ?Total Protein 6.5 - 8.1 g/dL 6.8    7.6    ?Total Bilirubin 0.3 - 1.2 mg/dL 0.5    0.6    ?Alkaline Phos 38 - 126 U/L 77    207    ?AST 15 - 41 U/L 16    22    ?ALT 0 - 44 U/L 10   20   21     ? ? ? ?Past Medical History:  ?Diagnosis Date  ? BPH (benign prostatic hypertrophy)   ? Bradycardia   ? a. H/o asymptomatic  bradycardia.  ? Coronary artery disease   ? a. s/p CABG ~2007 with LIMA to LAD, SVG seq to OM1 and OM2, SVG to PDA and SVG to diag. b. Abnormal nuc 03/2015 - s/p DES to SVG-OM1-OM2. 01/29/17 POBA with cutting balloon to SVG-->OM.  No ischemia Lexi myoview 2021  ? DCM (dilated cardiomyopathy) ([place]) 09/17/2015  ? mixed by cMRI 03/2021 with EF 39% with non viable infarct in basal to mid lateral wall and LGE pattern c/w nonischemic component as well. Mild RV dysfunction RVEF 42%.  ? Dyslipidemia   ? Dyspnea   ? r/t lung mass  ? ED (erectile dysfunction)   ? Fear of heights   ? Goals of care, counseling/discussion 06/19/2020  ? Hypertension   ? Non-small cell carcinoma of lung, stage 4, left ([place]) 06/19/2020  ? OSA (obstructive sleep apnea) 07/12/2015  ? Moderate OSA with AHI 16.5/hr now on CPAP at 8cm H2O, uses cpap nightly     ? RBBB   ? Shoulder, capsulitis, adhesive   ? Left Shoulder  ? ? ? ?Past Surgical History:  ?Procedure Laterality Date  ? APPENDECTOMY    ? BRONCHIAL BIOPSY  05/09/2020  ? Procedure: BRONCHIAL BIOPSIES;  Surgeon: [name], DO;  Location: [facility];  Service: Pulmonary;;  ? BRONCHIAL BRUSHINGS  05/09/2020  ? Procedure: BRONCHIAL BRUSHINGS;  Surgeon: [name], DO;  Location: [facility];   Service: Pulmonary;;  ? BRONCHIAL NEEDLE ASPIRATION BIOPSY  05/09/2020  ? Procedure: BRONCHIAL NEEDLE ASPIRATION BIOPSIES;  Surgeon: [name], DO;  Location: [facility];  Service: Pulmonary;;  ? BRONCHIAL WASHINGS  05/09/2020  ? Procedure: BRONCHIAL WASHINGS;  Surgeon: [name], DO;  Location: [facility];  Service: Pulmonary;;  ? CARDIAC CATHETERIZATION N/A 04/05/2015  ? Procedure: Left Heart Cath and Coronary Angiography;  Surgeon: Sherren Mocha, MD;  Location: [facility];  Service: Cardiovascular;  Laterality: N/A;  ? COLONOSCOPY    ? CORONARY ARTERY BYPASS GRAFT    ? w/LIMA to LAD, seq SVG to OM1 and OM2, SVG to PDA and SVG to Diagonal  ? CORONARY BALLOON ANGIOPLASTY N/A 01/29/2017  ? Procedure: Coronary Balloon Angioplasty;  Surgeon: Martinique, Peter M, MD;  Location: [facility];  Service: Cardiovascular;  Laterality: N/A;  ? FIDUCIAL MARKER PLACEMENT  05/09/2020  ? Procedure: FIDUCIAL MARKER PLACEMENT;  Surgeon: [name], DO;  Location: [facility];  Service: Pulmonary;;  ? INTERCOSTAL NERVE BLOCK Left 05/22/2020  ? Procedure: INTERCOSTAL NERVE BLOCK;  Surgeon: Lajuana Matte, MD;  Location: [facility];  Service: Thoracic;  Laterality: Left;  ? LEFT HEART CATH AND CORS/GRAFTS ANGIOGRAPHY N/A 01/29/2017  ? Procedure: Left Heart Cath and Cors/Grafts Angiography;  Surgeon: Martinique, Peter M, MD;  Location: [facility];  Service: Cardiovascular;  Laterality: N/A;  ? NODE DISSECTION N/A 05/22/2020  ? Procedure: NODE DISSECTION;  Surgeon: Lajuana Matte, MD;  Location: [facility];  Service: Thoracic;  Laterality: N/A;  ? VIDEO BRONCHOSCOPY WITH ENDOBRONCHIAL NAVIGATION N/A 05/09/2020  ? Procedure: VIDEO BRONCHOSCOPY WITH ENDOBRONCHIAL NAVIGATION;  Surgeon: [name], DO;  Location: [facility];  Service: Pulmonary;  Laterality: N/A;  ? VIDEO BRONCHOSCOPY WITH ENDOBRONCHIAL ULTRASOUND N/A 05/09/2020  ? Procedure: VIDEO BRONCHOSCOPY WITH ENDOBRONCHIAL ULTRASOUND;   Surgeon: [name], DO;  Location: [facility];  Service: Pulmonary;  Laterality: N/A;  ? ?Family History  ?Problem Relation Age of Onset  ? Lung cancer Mother   ? Lung cancer Father   ?  Heart disease Father   ? Heart failure Father   ? Cancer Sister   ? ?Social History  ? ?Tobacco Use  ? Smoking status: Former  ?  Types: Cigarettes  ?  Quit date: 07/21/1983  ?  Years since quitting: 38.3  ? Smokeless tobacco: Former  ?Vaping Use  ? Vaping Use: Never used  ?Substance Use Topics  ? Alcohol use: Yes  ?  Alcohol/week: 20.0 - 28.0 standard drinks  ?  Types: 20 - 28 Standard drinks or equivalent per week  ?  Comment: daily - 20-28 /week  ? Drug use: No  ? ?Current Outpatient Medications  ?Medication Sig Dispense Refill  ? Adagrasib 200 MG TABS Take 400 mg by mouth 2 (two) times daily. 120 tablet 4  ? apixaban (ELIQUIS) 5 MG TABS tablet Take 1 tablet by mouth 2 times daily. 60 tablet 3  ? aspirin 81 MG tablet Take 81 mg by mouth at bedtime.     ? cholecalciferol (VITAMIN D) 1000 UNITS tablet Take 1,000 Units by mouth at bedtime.     ? colchicine 0.6 MG tablet Take 1 tablet (0.6 mg total) by mouth every 8 (eight) hours as needed. 60 tablet 1  ? Cyanocobalamin (B-12 PO) Take 1,000 mcg by mouth daily.     ? diphenoxylate-atropine (LOMOTIL) 2.5-0.025 MG tablet TAKE 2 TABLETS BY MOUTH AT ONSET OF DIARRHEA THEN TAKE 1 TABLET AFTER EACH LOOSE STOOL MAX 8 TABLETS PER DAY 100 tablet 0  ? ezetimibe (ZETIA) 10 MG tablet Take 1 tablet (10 mg total) by mouth daily. 90 tablet 3  ? folic acid (FOLVITE) 1 MG tablet TAKE ONE TABLET BY MOUTH DAILY START 5 TO 7 DAYS BEFORE ALIMTA CHEMO AND CONTINUE UNTIL 21 DAYS AFTER COMPLETED 90 tablet 3  ? lidocaine (XYLOCAINE) 2 % solution Use as directed 15 mLs in the mouth or throat as needed for mouth pain. Swallow prior to meals, dilution instructions given by nurse 100 mL 1  ? metoprolol succinate (TOPROL-XL) 25 MG 24 hr tablet Take 1 tablet (25 mg total) by mouth at bedtime. 90 tablet 3  ?  nitroGLYCERIN (NITROSTAT) 0.4 MG SL tablet Place 1 tablet (0.4 mg total) under the tongue every 5 (five) minutes as needed for chest pain. 25 tablet 3  ? oxyCODONE-acetaminophen (PERCOCET) 7.5-325 MG tablet Take 1

## 2021-11-06 NOTE — Patient Instructions (Addendum)
If you are age 74 or older, your body mass index should be between 23-30. Your There is no height or weight on file to calculate BMI. If this is out of the aforementioned range listed, please consider follow up with your Primary Care Provider. ? ?__________________________________________________________ ? ?The [facility] providers would like to encourage you to use [facility] to communicate with providers for non-urgent requests or questions.  Due to long hold times on the telephone, sending your provider a message by [facility] may be a faster and more efficient way to get a response.  Please allow 48 business hours for a response.  Please remember that this is for non-urgent requests.   ? ?Due to recent changes in healthcare laws, you may see the results of your imaging and laboratory studies on MyChart before your provider has had a chance to review them.  We understand that in some cases there may be results that are confusing or concerning to you. Not all laboratory results come back in the same time frame and the provider may be waiting for multiple results in order to interpret others.  Please give [place] 48 hours in order for your provider to thoroughly review all the results before contacting the office for clarification of your results.  ? ?Please make an appointment as needed. ? ?Thank you for choosing me and [facility]. ? ?Gerrit Heck, D.O. ? ?We want to thank you for trusting [facility] with your care. All of our staff and providers value the relationships we have built with our patients, and it is an honor to care for you.  ? ?We are writing to let you know that [facility] will close on Dec 01, 2021, and we invite you to continue to see Dr. Carmell Austria and Gerrit Heck at the [facility] [place] office location. We are consolidating our serices at these [facility] practices to better provide care. Our office staff will work with you  to ensure a seamless transition.  ? ?Gerrit Heck, DO -Dr. Bryan Lemma will be movig to [facility] at [address] [place], [place] [address], effective Dec 01, 2021.  Contact [phone] to schedule an appointment with him.  ? ?Carmell Austria, MD- Dr. Lyndel Safe will be movig to [facility] at [address] [place], [place] [address], effective Dec 01, 2021.  Contact [phone] to schedule an appointment with him.  ? ?Requesting Medical Records ?If you need to request your medical records, please follow the instructions below. Your medical records are confidential, and a copy can be transferred to another provider or released to you or another person you designate only with your permission. ? ?There are several ways to request your medical records: ?Requests for medical records can be submitted through our practice.   ?You can also request your records electronically, in your MyChart account by selecting the ?Request Health Records? tab.  ?If you need additional information on how to request records, please go to [email or web address]/, choose Patient Information, then select Request Medical Records. ?To make an appointment or if you have any questions about your health care needs, please contact our office at [phone] and one of our staff members will be glad to assist you. ?[facility] is committed to providing exceptional care for you and our community. Thank you for allowing [place] to serve your health care needs. ?Sincerely, ? ?Windy Canny, Director [facility] ?[facility] also offers convenient virtual care options. Sore throat? Sinus problems? Cold  or flu symptoms? Get care from the comfort of home with [facility] Video Visits and e-Visits. Learn more about the non-emergency conditions treated and start your virtual visit at [email or web address] ? ? ? ? ?

## 2021-11-11 ENCOUNTER — Encounter: Payer: Self-pay | Admitting: Radiology

## 2021-11-13 DIAGNOSIS — F40228 Other natural environment type phobia: Secondary | ICD-10-CM | POA: Diagnosis not present

## 2021-11-13 DIAGNOSIS — F411 Generalized anxiety disorder: Secondary | ICD-10-CM | POA: Diagnosis not present

## 2021-11-13 DIAGNOSIS — F4323 Adjustment disorder with mixed anxiety and depressed mood: Secondary | ICD-10-CM | POA: Diagnosis not present

## 2021-11-17 DIAGNOSIS — Z20822 Contact with and (suspected) exposure to covid-19: Secondary | ICD-10-CM | POA: Diagnosis not present

## 2021-11-19 ENCOUNTER — Encounter (HOSPITAL_COMMUNITY)
Admission: RE | Admit: 2021-11-19 | Discharge: 2021-11-19 | Disposition: A | Discharge status: Home / Self Care | Payer: Medicare Other | Source: Ambulatory Visit | Attending: Hematology & Oncology | Admitting: Hematology & Oncology

## 2021-11-19 DIAGNOSIS — L928 Other granulomatous disorders of the skin and subcutaneous tissue: Secondary | ICD-10-CM | POA: Diagnosis not present

## 2021-11-19 DIAGNOSIS — C7970 Secondary malignant neoplasm of unspecified adrenal gland: Secondary | ICD-10-CM | POA: Diagnosis not present

## 2021-11-19 DIAGNOSIS — C7951 Secondary malignant neoplasm of bone: Secondary | ICD-10-CM | POA: Insufficient documentation

## 2021-11-19 DIAGNOSIS — C7972 Secondary malignant neoplasm of left adrenal gland: Secondary | ICD-10-CM | POA: Diagnosis not present

## 2021-11-19 DIAGNOSIS — Z902 Acquired absence of lung [part of]: Secondary | ICD-10-CM | POA: Insufficient documentation

## 2021-11-19 DIAGNOSIS — C7971 Secondary malignant neoplasm of right adrenal gland: Secondary | ICD-10-CM | POA: Diagnosis not present

## 2021-11-19 DIAGNOSIS — C3490 Malignant neoplasm of unspecified part of unspecified bronchus or lung: Secondary | ICD-10-CM | POA: Diagnosis not present

## 2021-11-19 DIAGNOSIS — C3492 Malignant neoplasm of unspecified part of left bronchus or lung: Secondary | ICD-10-CM | POA: Diagnosis not present

## 2021-11-19 DIAGNOSIS — I700 Atherosclerosis of aorta: Secondary | ICD-10-CM | POA: Insufficient documentation

## 2021-11-19 DIAGNOSIS — J8410 Pulmonary fibrosis, unspecified: Secondary | ICD-10-CM | POA: Diagnosis not present

## 2021-11-19 LAB — GLUCOSE, CAPILLARY: Glucose-Capillary: 99 mg/dL (ref 70–99)

## 2021-11-19 IMAGING — CT NM PET TUM IMG RESTAG (PS) SKULL BASE T - THIGH
7 series · 25 of 25 positions shown · non-contrast
Comparison: [DATE].

CLINICAL DATA: Subsequent treatment strategy for small cell lung
cancer.

EXAM:
NUCLEAR MEDICINE PET SKULL BASE TO THIGH
TECHNIQUE: 8.5 mCi F-18 FDG was injected intravenously. Full-ring PET imaging
was performed from the skull base to thigh after the radiotracer. CT
data was obtained and used for attenuation correction and anatomic
localization.
Fasting blood glucose: 99 mg/dl

[Series 3: pet sk_thigh ac · axial · 5.0mm · 4.07mm/px · z∈[-808,+140]mm · 5 of 238 slices shown]
[im 1/238]
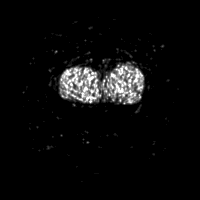
[im 60/238]
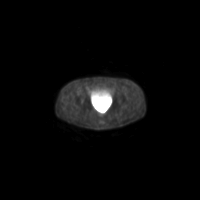
[im 119/238]
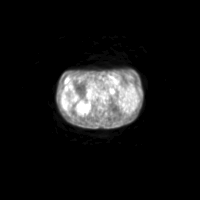
[im 178/238]
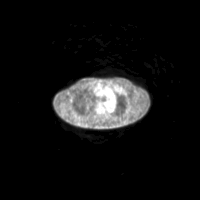
[im 238/238]
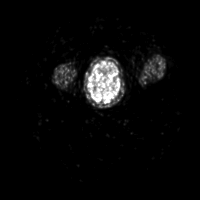

[Series 4: ct sk_thigh 5.0 br38 · axial · 5.0mm · 0.86mm/px · z∈[-808,+140]mm · 5 of 238 slices shown]
[im 1/238]
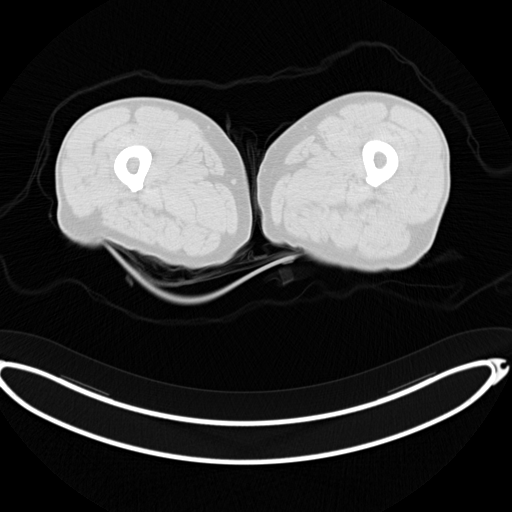
[im 60/238]
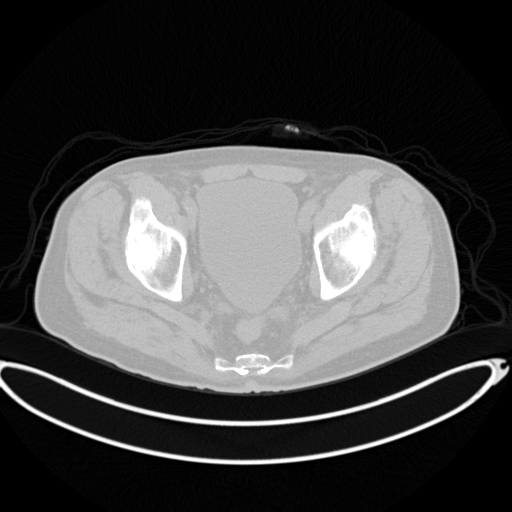
[im 119/238]
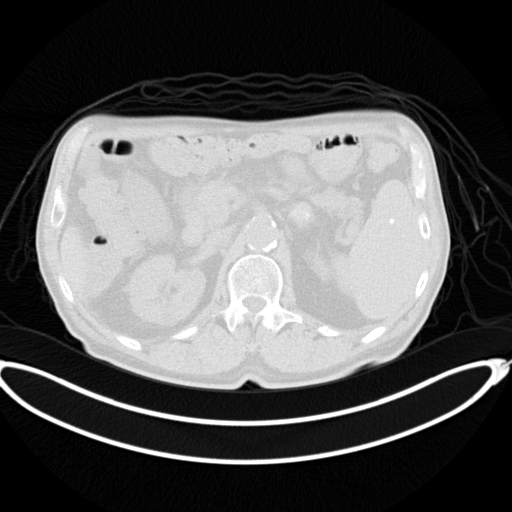
[im 178/238]
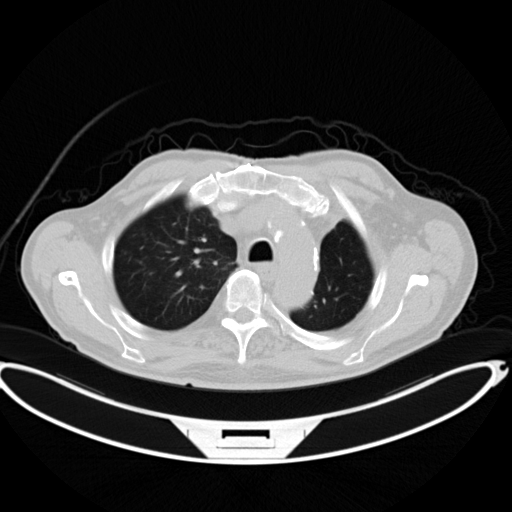
[im 238/238  brain]
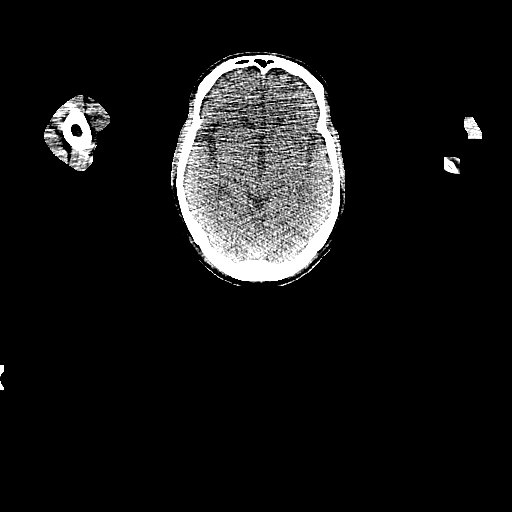

[Series 5: pet sk_thigh nac · axial · 5.0mm · 4.07mm/px · z∈[-808,+140]mm · 6 of 238 slices shown]
[im 1/238]
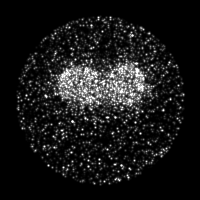
[im 48/238]
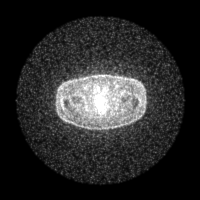
[im 95/238]
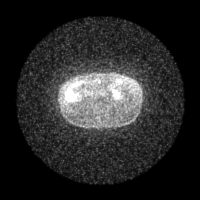
[im 143/238]
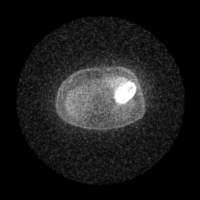
[im 190/238]
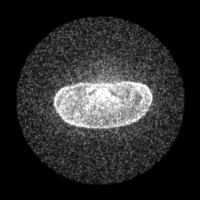
[im 238/238]
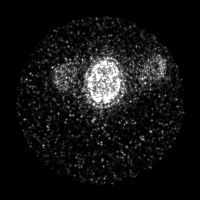

[Series 7: ct 5.0 bl57 lung_bone · axial · 5.0mm · 0.67mm/px · z∈[-298,-30]mm · 2 of 68 slices shown]
[im 1/68]
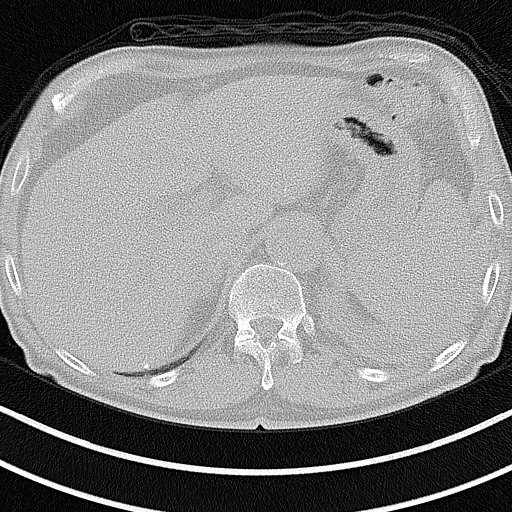
[im 68/68]
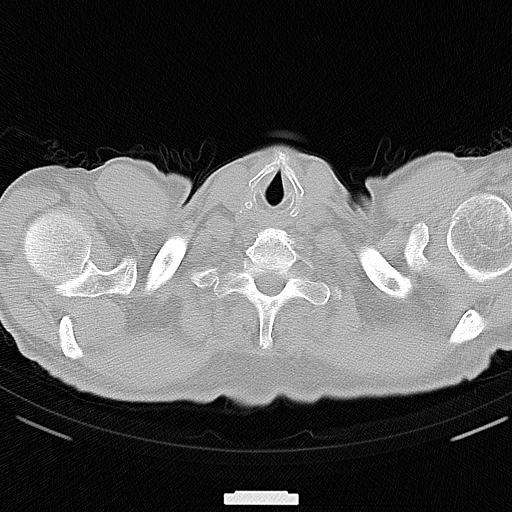

[Series 603: fused tra · 5 of 217 slices shown]
[im 1/217]
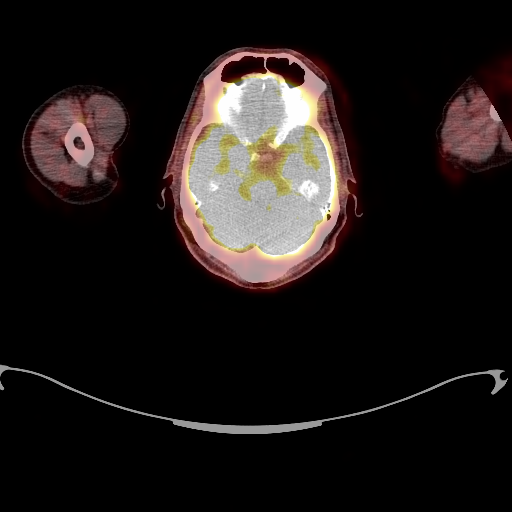
[im 55/217]
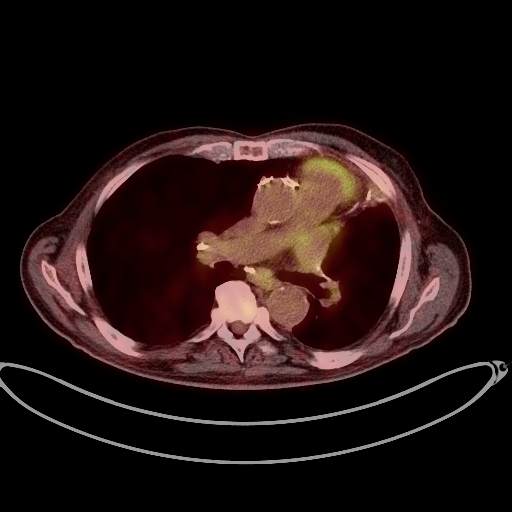
[im 109/217]
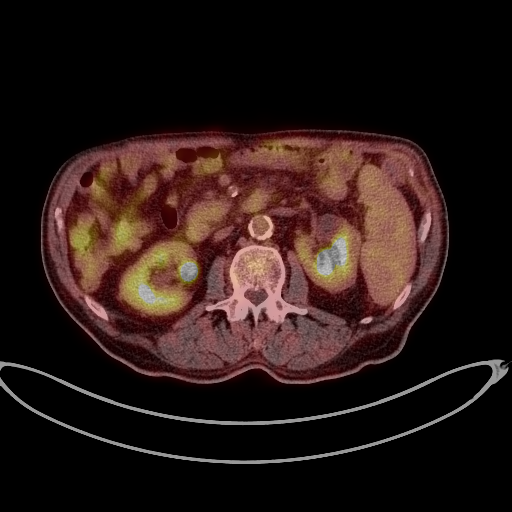
[im 163/217]
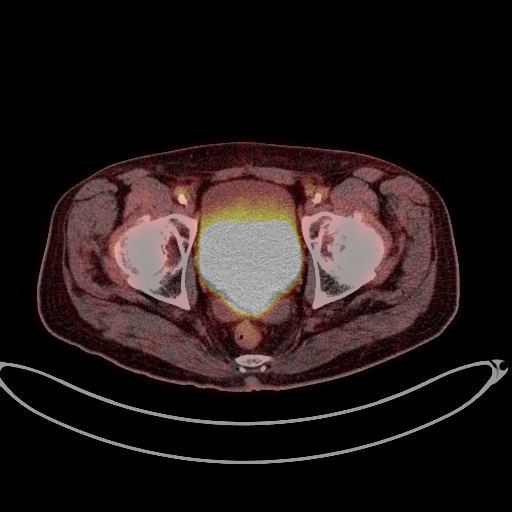
[im 217/217]
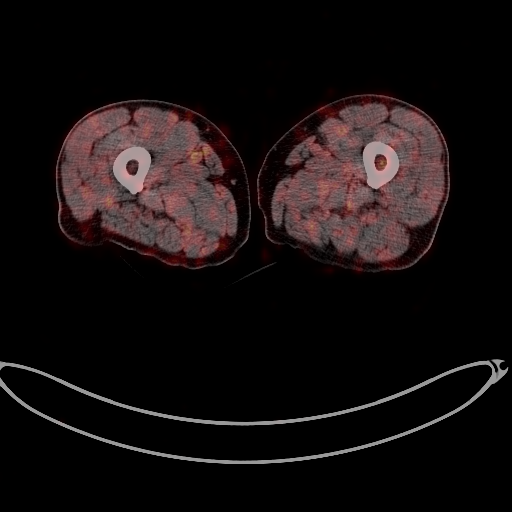

[Series 604: fused cor · 1 of 31 slices shown]
[im 1/31]
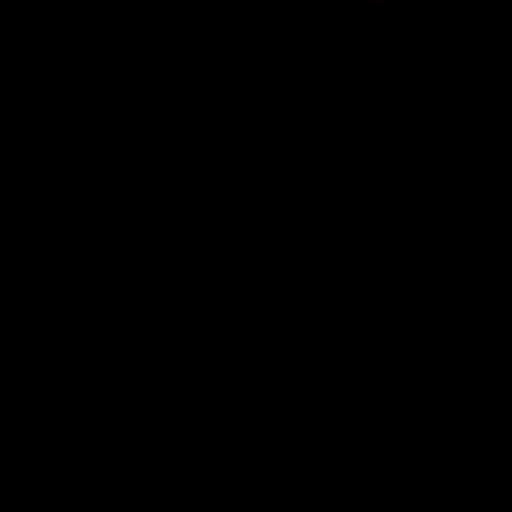

[Series 605: mip pet · coronal · 1.97mm/px · 1 of 32 slices shown]
[im 1/32]
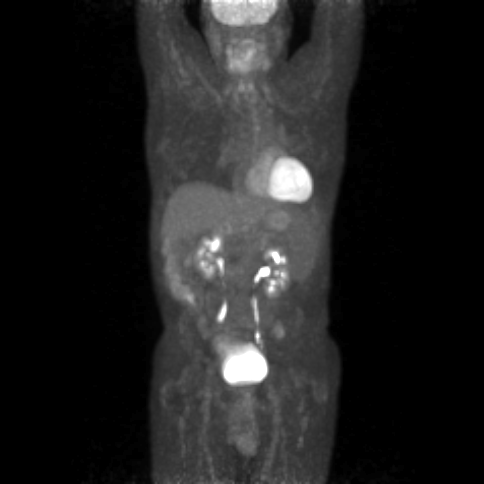

[25 of 25 positions shown; findings below may reference images not displayed]

FINDINGS: Mediastinal blood pool activity: SUV max

Liver activity: SUV max NA

NECK: No hypermetabolic lymph nodes in the neck.

Incidental CT findings: none

CHEST: Post LEFT upper lobectomy. No adenopathy in the chest. No
suspicious pulmonary nodule

Incidental CT findings: Postoperative changes in the LEFT chest.
Granuloma at the RIGHT lung base. Airways are patent.

Aortic atherosclerosis and signs of CABG.

ABDOMEN/PELVIS: Bilateral adrenal metastatic disease decreased in
size. RIGHT adrenal measuring 5.7 cm previously 7.8 cm (image 113/4)
maximum SUV in this location of 4.51 as compared to

Marked decrease in size of the LEFT adrenal now with maximum SUV of
3.64 as compared to 23. Size at 2.1 cm greatest axial dimension as
compared to 4.6 cm. No signs of adenopathy in the abdomen or in the
pelvis

Incidental CT findings: No acute findings relative to liver,
gallbladder, spleen, pancreas, kidneys, stomach, small or large
bowel with signs of colonic diverticulosis. Aortic atherosclerotic
plaque, moderate to marked without aneurysmal dilation of the
abdominal aorta.

SKELETON: Generalized diminished metabolic activity in marrow spaces
and with diminished metabolic activity associated with metastatic
bony lesions.

Sclerotic lesion in T1 with an SUV of 3.0 previously 7.1 unchanged
appearance on CT.

Lesion previously reported as T11 actually appears to be T10 showing
mixed sclerotic changes similar appearance on CT and involving the
RIGHT lateral aspect of the vertebral body displaying decreased
metabolic activity (image 99/4) 3.93 maximum SUV is compared to
12.63

LEFT iliac lesion with a maximum SUV of 4.3 showing prior maximum
SUV of 9.76 showing no change on CT.

Incidental CT findings: Spinal degenerative changes, changes of
median sternotomy.
IMPRESSION: Marked interval response to therapy still with some residual
activity in bony and adrenal metastatic disease. Adrenal glands
displayed interval decrease in size on the CT portion of the exam.

No new signs of metastatic disease.

## 2021-11-19 MED ORDER — FLUDEOXYGLUCOSE F - 18 (FDG) INJECTION
8.5000 | Freq: Once | INTRAVENOUS | Status: AC
  Administered 2021-11-19: 8.5 via INTRAVENOUS

## 2021-11-19 NOTE — Progress Notes (Signed)
?Radiation Oncology         (336) [phone] ?________________________________ ? ?Name: John Hinton MRN: [id number]  ?Date: 11/20/2021  DOB: 08-20-1945 ? ?Follow-Up Visit Note ? ?CC: [name], MD  [name], MD ? ?  ICD-10-CM   ?1. Non-small cell carcinoma of lung, stage 4, left (HCC)  C34.92   ?  ? ? ?Diagnosis: The primary encounter diagnosis was Non-small cell carcinoma of lung, stage 4, left ([place]). A diagnosis of Malignant neoplasm of lung, unspecified laterality, unspecified part of lung ([place]) was also pertinent to this visit. ?  ?The encounter diagnosis was Non-small cell carcinoma of lung, stage 4, left ([place]). ?  ?LUL adenocarcinoma, now with metastasis to right lung, bilateral adrenal glands, and bony metastasis to T1, T10, and left iliac bone. PET on 09/03/21 with evidence of recent disease progression.   ? ?Interval Since Last Radiation: 1 month and 3 days  ? ?Intent: Palliative ? ?Radiation Treatment Dates: 10/07/2021 through 10/20/2021 ?Site Technique Total Dose (Gy) Dose per Fx (Gy) Completed Fx Beam Energies  ?Thoracic Spine: Spine 3D 30/30 3 10/10 6X, 10X  ?Thoracic Spine: Spine_Lwr+Abd 3D 30/30 3 10/10 15X  ?Ilium, Left: Pelvis_L 3D 30/30 3 10/10 15X  ? ? ?Narrative:  The patient returns today for routine follow-up.  The patient tolerated radiation therapy relatively well. During his final weekly treatment check on 10/14/21, the patient reported fatigue and mild nausea. He otherwise denied any other symptoms.    ? ?Since completing treatment, the patient followed up with Dr. Marin Olp on 10/24/21. During which time, the patient reported his main issues to be odynophagia and dysphagia, and that he had not yet used the carafate we gave to him. He also endorsed issues with diarrhea which lomotil seems to help with, and was noted to be tolerating adagrasib quite well.  ? ?Per his most recent follow up with GI on 11/06/21, the patient noted improvement in his swallowing issues with carafate and  viscous lidocaine. The patient was instructed to continue on carafate for the next 6 weeks then prn after, and to continue viscous lidocaine for 2 weeks then prn.                 ? ?The patient has a restaging PET showing good response to his recent palliative radiation therapy.  The patient will meet with Dr. Marin Olp early next week for further review of his PET scan. ? ?Patient did have a rough time with odynophagia for approximately a week.  He continues on Carafate and is now stopped using his viscous lidocaine.  He is back to eating solid foods as usual.  His taste is still off from his chemotherapy.        ? ?Allergies:  is allergic to amlodipine. ? ?Meds: ?Current Outpatient Medications  ?Medication Sig Dispense Refill  ? Adagrasib 200 MG TABS Take 400 mg by mouth 2 (two) times daily. 120 tablet 4  ? apixaban (ELIQUIS) 5 MG TABS tablet Take 1 tablet by mouth 2 times daily. 60 tablet 3  ? aspirin 81 MG tablet Take 81 mg by mouth at bedtime.     ? cholecalciferol (VITAMIN D) 1000 UNITS tablet Take 1,000 Units by mouth at bedtime.     ? colchicine 0.6 MG tablet Take 1 tablet (0.6 mg total) by mouth every 8 (eight) hours as needed. 60 tablet 1  ? Cyanocobalamin (B-12 PO) Take 1,000 mcg by mouth daily.     ? diphenoxylate-atropine (LOMOTIL) 2.5-0.025 MG tablet  TAKE 2 TABLETS BY MOUTH AT ONSET OF DIARRHEA THEN TAKE 1 TABLET AFTER EACH LOOSE STOOL MAX 8 TABLETS PER DAY 100 tablet 0  ? ezetimibe (ZETIA) 10 MG tablet Take 1 tablet (10 mg total) by mouth daily. 90 tablet 3  ? folic acid (FOLVITE) 1 MG tablet TAKE ONE TABLET BY MOUTH DAILY START 5 TO 7 DAYS BEFORE ALIMTA CHEMO AND CONTINUE UNTIL 21 DAYS AFTER COMPLETED 90 tablet 3  ? metoprolol succinate (TOPROL-XL) 25 MG 24 hr tablet Take 1 tablet (25 mg total) by mouth at bedtime. 90 tablet 3  ? nitroGLYCERIN (NITROSTAT) 0.4 MG SL tablet Place 1 tablet (0.4 mg total) under the tongue every 5 (five) minutes as needed for chest pain. 25 tablet 3  ?  oxyCODONE-acetaminophen (PERCOCET) 7.5-325 MG tablet Take 1 tablet by mouth every 4 (four) hours as needed for severe pain. 60 tablet 0  ? rosuvastatin (CRESTOR) 40 MG tablet Take 1 tablet (40 mg total) by mouth at bedtime. 90 tablet 3  ? sacubitril-valsartan (ENTRESTO) 24-26 MG Take 1 tablet by mouth 2 (two) times daily. 60 tablet 11  ? sertraline (ZOLOFT) 100 MG tablet Take 100 mg by mouth 2 (two) times daily.    ? sucralfate (CARAFATE) 1 g tablet Take 1 tablet (1 g total) by mouth 4 (four) times daily. Dissolve each tablet in 15 cc water before use. 120 tablet 2  ? traMADol (ULTRAM) 50 MG tablet Take 1 tablet (50 mg total) by mouth every 6 (six) hours as needed. 90 tablet 0  ? lidocaine (XYLOCAINE) 2 % solution Use as directed 15 mLs in the mouth or throat as needed for mouth pain. Swallow prior to meals, dilution instructions given by nurse (Patient not taking: Reported on 11/20/2021) 100 mL 1  ? ?No current facility-administered medications for this encounter.  ? ? ?Physical Findings: ?The patient is in no acute distress. Patient is alert and oriented. ? height is 5\' 11"  (1.803 m) and weight is 178 lb 6 oz (80.9 kg). His temporal temperature is 96.1 ?F (35.6 ?C) (abnormal). His blood pressure is 143/70 (abnormal) and his pulse is 63. His respiration is 18 and oxygen saturation is 100%. .  . Lungs are clear to auscultation bilaterally. Heart has regular rate and rhythm. No palpable cervical, supraclavicular, or axillary adenopathy. Abdomen soft, non-tender, normal bowel sounds.  ? ? ?Lab Findings: ?Lab Results  ?Component Value Date  ? WBC 4.1 10/24/2021  ? HGB 11.5 (L) 10/24/2021  ? HCT 34.8 (L) 10/24/2021  ? MCV 97.5 10/24/2021  ? PLT 98 (L) 10/24/2021  ? ? ?Radiographic Findings: ?NM PET Image Restag (PS) Skull Base To Thigh ? ?Result Date: 11/20/2021 ?CLINICAL DATA:  Subsequent treatment strategy for small cell lung cancer. EXAM: NUCLEAR MEDICINE PET SKULL BASE TO THIGH TECHNIQUE: 8.5 mCi F-18 FDG was injected  intravenously. Full-ring PET imaging was performed from the skull base to thigh after the radiotracer. CT data was obtained and used for attenuation correction and anatomic localization. Fasting blood glucose: 99 mg/dl COMPARISON:  September 22, 2021. FINDINGS: Mediastinal blood pool activity: SUV max 2.28 Liver activity: SUV max NA NECK: No hypermetabolic lymph nodes in the neck. Incidental CT findings: none CHEST: Post LEFT upper lobectomy. No adenopathy in the chest. No suspicious pulmonary nodule Incidental CT findings: Postoperative changes in the LEFT chest. Granuloma at the RIGHT lung base. Airways are patent. Aortic atherosclerosis and signs of CABG. ABDOMEN/PELVIS: Bilateral adrenal metastatic disease decreased in size. RIGHT adrenal measuring 5.7 cm  previously 7.8 cm (image 113/4) maximum SUV in this location of 4.51 as compared to 17.55 Marked decrease in size of the LEFT adrenal now with maximum SUV of 3.64 as compared to 23. Size at 2.1 cm greatest axial dimension as compared to 4.6 cm. No signs of adenopathy in the abdomen or in the pelvis Incidental CT findings: No acute findings relative to liver, gallbladder, spleen, pancreas, kidneys, stomach, small or large bowel with signs of colonic diverticulosis. Aortic atherosclerotic plaque, moderate to marked without aneurysmal dilation of the abdominal aorta. SKELETON: Generalized diminished metabolic activity in marrow spaces and with diminished metabolic activity associated with metastatic bony lesions. Sclerotic lesion in T1 with an SUV of 3.0 previously 7.1 unchanged appearance on CT. Lesion previously reported as T11 actually appears to be T10 showing mixed sclerotic changes similar appearance on CT and involving the RIGHT lateral aspect of the vertebral body displaying decreased metabolic activity (image 73/4) 3.93 maximum SUV is compared to 12.63 LEFT iliac lesion with a maximum SUV of 4.3 showing prior maximum SUV of 9.76 showing no change on CT.  Incidental CT findings: Spinal degenerative changes, changes of median sternotomy. IMPRESSION: Marked interval response to therapy still with some residual activity in bony and adrenal metastatic disease. Adrenal glands displayed

## 2021-11-19 NOTE — Progress Notes (Incomplete)
?  Radiation Oncology         (336) [phone] ?________________________________ ? ?Patient Name: John Hinton ?MRN: [id number] ?DOB: [birth year] ?Referring Physician: Burney Gauze (Profile Not Attached) ?Date of Service: 10/20/2021 ?[facility], [place] ? ?                                                      End Of Treatment Note ? ?Diagnoses: C34.12-Malignant neoplasm of upper lobe, left bronchus or lung ? ?Cancer Staging: The primary encounter diagnosis was Non-small cell carcinoma of lung, stage 4, left ([place]). A diagnosis of Malignant neoplasm of lung, unspecified laterality, unspecified part of lung ([place]) was also pertinent to this visit. ?  ?The encounter diagnosis was Non-small cell carcinoma of lung, stage 4, left ([place]). ?  ?LUL adenocarcinoma, now with metastasis to right lung, bilateral adrenal glands, and bony metastasis to T1, T10, and left iliac bone. PET on 09/03/21 with evidence of recent disease progression.  ? ?Intent: Palliative ? ?Radiation Treatment Dates: 10/07/2021 through 10/20/2021 ?Site Technique Total Dose (Gy) Dose per Fx (Gy) Completed Fx Beam Energies  ?Thoracic Spine: Spine 3D 30/30 3 10/10 6X, 10X  ?Thoracic Spine: Spine_Lwr+Abd 3D 30/30 3 10/10 15X  ?Ilium, Left: Pelvis_L 3D 30/30 3 10/10 15X  ? ?Narrative: The patient tolerated radiation therapy relatively well. During his final weekly treatment check on 10/14/21, the patient reported fatigue and mild nausea. He otherwise denied any other symptoms.  ? ?Plan: The patient will follow-up with radiation oncology in one month . ? ?________________________________________________ ?----------------------------------- ? ?Blair Promise, PhD, MD ? ?This document serves as a record of services personally performed by Gery Pray, MD. It was created on his behalf by Roney Mans, a trained medical scribe. The creation of this record is based on the scribe's personal observations and the provider's statements to them. This  document has been checked and approved by the attending provider. ? ?

## 2021-11-20 ENCOUNTER — Ambulatory Visit
Admission: RE | Admit: 2021-11-20 | Discharge: 2021-11-20 | Disposition: A | Discharge status: Home / Self Care | Payer: Medicare Other | Source: Ambulatory Visit | Attending: Radiation Oncology | Admitting: Radiation Oncology

## 2021-11-20 ENCOUNTER — Other Ambulatory Visit: Payer: Self-pay

## 2021-11-20 ENCOUNTER — Encounter: Payer: Self-pay | Admitting: *Deleted

## 2021-11-20 ENCOUNTER — Encounter: Payer: Self-pay | Admitting: Radiation Oncology

## 2021-11-20 VITALS — BP 143/70 | HR 63 | Temp 96.1°F | Resp 18 | Ht 71.0 in | Wt 178.4 lb

## 2021-11-20 DIAGNOSIS — C7971 Secondary malignant neoplasm of right adrenal gland: Secondary | ICD-10-CM | POA: Insufficient documentation

## 2021-11-20 DIAGNOSIS — C3412 Malignant neoplasm of upper lobe, left bronchus or lung: Secondary | ICD-10-CM | POA: Diagnosis not present

## 2021-11-20 DIAGNOSIS — C7801 Secondary malignant neoplasm of right lung: Secondary | ICD-10-CM | POA: Diagnosis not present

## 2021-11-20 DIAGNOSIS — C7972 Secondary malignant neoplasm of left adrenal gland: Secondary | ICD-10-CM | POA: Diagnosis not present

## 2021-11-20 DIAGNOSIS — K209 Esophagitis, unspecified without bleeding: Secondary | ICD-10-CM | POA: Insufficient documentation

## 2021-11-20 DIAGNOSIS — C7951 Secondary malignant neoplasm of bone: Secondary | ICD-10-CM | POA: Insufficient documentation

## 2021-11-20 DIAGNOSIS — Z923 Personal history of irradiation: Secondary | ICD-10-CM | POA: Insufficient documentation

## 2021-11-20 DIAGNOSIS — Z7901 Long term (current) use of anticoagulants: Secondary | ICD-10-CM | POA: Diagnosis not present

## 2021-11-20 DIAGNOSIS — Z79899 Other long term (current) drug therapy: Secondary | ICD-10-CM | POA: Diagnosis not present

## 2021-11-20 DIAGNOSIS — C3492 Malignant neoplasm of unspecified part of left bronchus or lung: Secondary | ICD-10-CM

## 2021-11-20 DIAGNOSIS — Z7982 Long term (current) use of aspirin: Secondary | ICD-10-CM | POA: Insufficient documentation

## 2021-11-20 NOTE — Progress Notes (Addendum)
John Hinton is here today for follow up post radiation to the lung. ? ?Lung Side: left ? ?Completed radiation treatment to thoracic spine and left ilium on: 10/20/2021 ? ?Does the patient complain of any of the following: ?Pain:denies ?Shortness of breath w/wo exertion: shortness of breath with exertion ?Cough: dry cough ?Hemoptysis: denies ?Pain with swallowing: has diminished somewhat over the last week ?Swallowing/choking concerns: has decreased over the last week ?Appetite: good ?Energy Level: improving ?Post radiation skin Changes: denies ?Ambulates slowly ?Denies neuropathy ? ?Additional comments if applicable: back aches in morning and evening. ? ?Vitals:  ? 11/20/21 1127  ?BP: (!) 143/70  ?Pulse: 63  ?Resp: 18  ?Temp: (!) 96.1 ?F (35.6 ?C)  ?TempSrc: Temporal  ?SpO2: 100%  ?Weight: 178 lb 6 oz (80.9 kg)  ?Height: 5\' 11"  (1.803 m)  ? ? ? ?

## 2021-11-20 NOTE — Progress Notes (Signed)
PET scan show response to therapy.  ? ?Oncology Nurse Navigator Documentation ? ? ?  11/20/2021  ?  9:30 AM  ?Oncology Nurse Navigator Flowsheets  ?Navigator Follow Up Date: 11/24/2021  ?Navigator Follow Up Reason: Follow-up Appointment  ?Navigator Location [facility]  ?Navigator Encounter Type Scan Review  ?Patient Visit Type MedOnc  ?Treatment Phase Active Tx  ?Barriers/Navigation Needs Coordination of Care;Education  ?Interventions None Required  ?Acuity Level 2-Minimal Needs (1-2 Barriers Identified)  ?Support Groups/Services Friends and Family  ?Time Spent with Patient 15  ?  ?

## 2021-11-24 ENCOUNTER — Encounter: Payer: Self-pay | Admitting: *Deleted

## 2021-11-24 ENCOUNTER — Inpatient Hospital Stay (HOSPITAL_BASED_OUTPATIENT_CLINIC_OR_DEPARTMENT_OTHER): Payer: Medicare Other | Admitting: Hematology & Oncology

## 2021-11-24 ENCOUNTER — Encounter: Payer: Self-pay | Admitting: Hematology & Oncology

## 2021-11-24 ENCOUNTER — Inpatient Hospital Stay: Payer: Medicare Other | Attending: Hematology & Oncology

## 2021-11-24 VITALS — BP 128/62 | HR 58 | Temp 98.7°F | Resp 20 | Wt 179.0 lb

## 2021-11-24 DIAGNOSIS — C7971 Secondary malignant neoplasm of right adrenal gland: Secondary | ICD-10-CM | POA: Diagnosis not present

## 2021-11-24 DIAGNOSIS — C3492 Malignant neoplasm of unspecified part of left bronchus or lung: Secondary | ICD-10-CM

## 2021-11-24 DIAGNOSIS — C7951 Secondary malignant neoplasm of bone: Secondary | ICD-10-CM | POA: Diagnosis not present

## 2021-11-24 DIAGNOSIS — Z7901 Long term (current) use of anticoagulants: Secondary | ICD-10-CM | POA: Diagnosis not present

## 2021-11-24 DIAGNOSIS — Z20822 Contact with and (suspected) exposure to covid-19: Secondary | ICD-10-CM | POA: Diagnosis not present

## 2021-11-24 DIAGNOSIS — C3412 Malignant neoplasm of upper lobe, left bronchus or lung: Secondary | ICD-10-CM | POA: Diagnosis not present

## 2021-11-24 DIAGNOSIS — C7972 Secondary malignant neoplasm of left adrenal gland: Secondary | ICD-10-CM | POA: Diagnosis not present

## 2021-11-24 LAB — CMP (CANCER CENTER ONLY)
ALT: 24 U/L (ref 0–44)
AST: 29 U/L (ref 15–41)
Albumin: 4.4 g/dL (ref 3.5–5.0)
Alkaline Phosphatase: 131 U/L — ABNORMAL HIGH (ref 38–126)
Anion gap: 7 (ref 5–15)
BUN: 14 mg/dL (ref 8–23)
CO2: 30 mmol/L (ref 22–32)
Calcium: 9.5 mg/dL (ref 8.9–10.3)
Chloride: 107 mmol/L (ref 98–111)
Creatinine: 0.82 mg/dL (ref 0.61–1.24)
GFR, Estimated: >60 mL/min (ref >60)
Glucose, Bld: 114 mg/dL — ABNORMAL HIGH (ref 70–99)
Potassium: 4.2 mmol/L (ref 3.5–5.1)
Sodium: 144 mmol/L (ref 135–145)
Total Bilirubin: 0.4 mg/dL (ref 0.3–1.2)
Total Protein: 6.7 g/dL (ref 6.5–8.1)

## 2021-11-24 LAB — CBC WITH DIFFERENTIAL (CANCER CENTER ONLY)
Abs Immature Granulocytes: 0.01 10*3/uL (ref 0.00–0.07)
Basophils Absolute: 0 10*3/uL (ref 0.0–0.1)
Basophils Relative: 0 %
Eosinophils Absolute: 0.1 10*3/uL (ref 0.0–0.5)
Eosinophils Relative: 2 %
HCT: 35.5 % — ABNORMAL LOW (ref 39.0–52.0)
Hemoglobin: 11.9 g/dL — ABNORMAL LOW (ref 13.0–17.0)
Immature Granulocytes: 0 %
Lymphocytes Relative: 9 %
Lymphs Abs: 0.3 10*3/uL — ABNORMAL LOW (ref 0.7–4.0)
MCH: 34 pg (ref 26.0–34.0)
MCHC: 33.5 g/dL (ref 30.0–36.0)
MCV: 101.4 fL — ABNORMAL HIGH (ref 80.0–100.0)
Monocytes Absolute: 0.4 10*3/uL (ref 0.1–1.0)
Monocytes Relative: 12 %
Neutro Abs: 2.5 10*3/uL (ref 1.7–7.7)
Neutrophils Relative %: 77 %
Platelet Count: 95 10*3/uL — ABNORMAL LOW (ref 150–400)
RBC: 3.5 MIL/uL — ABNORMAL LOW (ref 4.22–5.81)
RDW: 19.3 % — ABNORMAL HIGH (ref 11.5–15.5)
WBC Count: 3.2 10*3/uL — ABNORMAL LOW (ref 4.0–10.5)
nRBC: 0 % (ref 0.0–0.2)

## 2021-11-24 LAB — LACTATE DEHYDROGENASE: LDH: 144 U/L (ref 98–192)

## 2021-11-24 MED ORDER — METHYLPREDNISOLONE 4 MG PO TBPK: ORAL_TABLET | ORAL | 3 refills | Status: DC

## 2021-11-24 NOTE — Progress Notes (Signed)
?Hematology and Oncology Follow Up Visit ? ?John Hinton ?[id number] ?[birth year] 76 y.o. ?11/24/2021 ? ? 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 ?Past Therapy: ?S/P cycle 6 of Carbo/Alimta/Pembrolizumab ?Alimta/pembrolizumab-maintenance therapy s/p cycle 3/4  - start on 11/28/2020 --Alimta discontinued after cycle 2 ?  ?Current Therapy:        ?Lumakras 360 mg po q day -- started on 07/31/2021 -- d/c on 02/07/203 for toxicity ?Xgeva every 3 months - due again 12/2021 ?Eliquis 5 mg PO BID ?Adagrasib 200 mg po BID -- start on 09/22/2021 ?  ?Interim History:  John Hinton is here today for follow-up.  John Hinton finally looks like his old self.  John Hinton feels like his old self.  I am very happy for him. ? ?We did do a PET scan on him.  The PET scan did show that John Hinton had a very nice response. ? ?I know that John Hinton recently has had radiation treatments.  I think this is helped a little bit. ? ?John Hinton is eating well.  John Hinton is gaining weight.  John Hinton is having no dysphagia or odynophagia. ? ?There is been no change in bowel or bladder habits.  John Hinton is no longer having any diarrhea. ? ?John Hinton is on the 200 mg twice a day dose.  This seems to be working well for him. ? ?John Hinton has had no rashes.  Is been no leg swelling..  There is a lesion on the right lower leg.  John Hinton has been seen by dermatology.  I am unsure exactly what this might be. ? ?John Hinton has had no headache. ? ?John Hinton is doing well on the Eliquis. ? ?Overall, I would say his performance status is probably ECOG 1.  .     ? ?Medications:  ?Allergies as of 11/24/2021   ? ?   Reactions  ? Amlodipine Swelling  ? Swelling of ankles  ? ?  ? ?  ?Medication List  ?  ? ?  ? Accurate as of Nov 24, 2021 11:29 AM. If you have any questions, ask your nurse or  doctor.  ?  ?  ? ?  ? ?STOP taking these medications   ? ?lidocaine 2 % solution ?Commonly known as: XYLOCAINE ?Stopped by: [name], MD ?  ? ?  ? ?TAKE these medications   ? ?Adagrasib 200 MG Tabs ?Take 400 mg by mouth 2 (two) times daily. ?  ?apixaban 5 MG Tabs tablet ?Commonly known as: ELIQUIS ?Take 1 tablet by mouth 2 times daily. ?  ?aspirin 81 MG tablet ?Take 81 mg by mouth at bedtime. ?  ?B-12 PO ?Take 1,000 mcg by mouth daily. ?  ?cholecalciferol 1000 units tablet ?Commonly known as: VITAMIN D ?Take 1,000 Units by mouth at bedtime. ?  ?colchicine 0.6 MG tablet ?Take 1 tablet (0.6 mg total) by mouth every 8 (eight) hours as needed. ?  ?diphenoxylate-atropine 2.5-0.025 MG tablet ?Commonly known as: LOMOTIL ?TAKE 2 TABLETS BY MOUTH AT ONSET OF DIARRHEA THEN TAKE 1 TABLET AFTER EACH LOOSE STOOL MAX 8 TABLETS PER DAY ?  ?Entresto 24-26 MG ?Generic drug: sacubitril-valsartan ?Take 1 tablet by mouth 2 (two) times daily. ?  ?ezetimibe 10 MG tablet ?Commonly known as: ZETIA ?Take 1 tablet (10 mg total) by  mouth daily. ?  ?folic acid 1 MG tablet ?Commonly known as: FOLVITE ?TAKE ONE TABLET BY MOUTH DAILY START 5 TO 7 DAYS BEFORE ALIMTA CHEMO AND CONTINUE UNTIL 21 DAYS AFTER COMPLETED ?  ?metoprolol succinate 25 MG 24 hr tablet ?Commonly known as: TOPROL-XL ?Take 1 tablet (25 mg total) by mouth at bedtime. ?  ?nitroGLYCERIN 0.4 MG SL tablet ?Commonly known as: NITROSTAT ?Place 1 tablet (0.4 mg total) under the tongue every 5 (five) minutes as needed for chest pain. ?  ?oxyCODONE-acetaminophen 7.5-325 MG tablet ?Commonly known as: Percocet ?Take 1 tablet by mouth every 4 (four) hours as needed for severe pain. ?  ?rosuvastatin 40 MG tablet ?Commonly known as: CRESTOR ?Take 1 tablet (40 mg total) by mouth at bedtime. ?  ?sertraline 100 MG tablet ?Commonly known as: ZOLOFT ?Take 100 mg by mouth 2 (two) times daily. ?  ?sucralfate 1 g tablet ?Commonly known as: Carafate ?Take 1 tablet (1 g total) by mouth 4 (four)  times daily. Dissolve each tablet in 15 cc water before use. ?  ?traMADol 50 MG tablet ?Commonly known as: ULTRAM ?Take 1 tablet (50 mg total) by mouth every 6 (six) hours as needed. ?  ? ?  ? ? ?Allergies:  ?Allergies  ?Allergen Reactions  ? Amlodipine Swelling  ?  Swelling of ankles  ? ? ?Past Medical History, Surgical history, Social history, and Family History were reviewed and updated. ? ?Review of Systems: ?Review of Systems  ?Constitutional: Negative.   ?HENT: Negative.    ?Eyes: Negative.   ?Respiratory: Negative.    ?Cardiovascular: Negative.   ?Gastrointestinal: Negative.   ?Genitourinary: Negative.   ?Musculoskeletal: Negative.   ?Skin: Negative.   ?Neurological: Negative.   ?Endo/Heme/Allergies: Negative.   ?Psychiatric/Behavioral: Negative.    ? ? ?Physical Exam: ? weight is 179 lb (81.2 kg). His oral temperature is 98.7 ?F (37.1 ?C). His blood pressure is 128/62 and his pulse is 58 (abnormal). His respiration is 20 and oxygen saturation is 100%.  ? ?Wt Readings from Last 3 Encounters:  ?11/24/21 179 lb (81.2 kg)  ?11/20/21 178 lb 6 oz (80.9 kg)  ?11/06/21 175 lb 8 oz (79.6 kg)  ? ? ?Physical Exam ?Vitals reviewed.  ?HENT:  ?   Head: Normocephalic and atraumatic.  ?Eyes:  ?   Pupils: Pupils are equal, round, and reactive to light.  ?Cardiovascular:  ?   Rate and Rhythm: Normal rate and regular rhythm.  ?   Heart sounds: Normal heart sounds.  ?Pulmonary:  ?   Effort: Pulmonary effort is normal.  ?   Breath sounds: Normal breath sounds.  ?Abdominal:  ?   General: Bowel sounds are normal.  ?   Palpations: Abdomen is soft.  ?Musculoskeletal:     ?   General: No tenderness or deformity. Normal range of motion.  ?   Cervical back: Normal range of motion.  ?Lymphadenopathy:  ?   Cervical: No cervical adenopathy.  ?Skin: ?   General: Skin is warm and dry.  ?   Findings: No erythema or rash.  ?Neurological:  ?   Mental Status: John Hinton is alert and oriented to person, place, and time.  ?Psychiatric:     ?   Behavior:  Behavior normal.     ?   Thought Content: Thought content normal.     ?   Judgment: Judgment normal.  ? ? ?Lab Results  ?Component Value Date  ? WBC 3.2 (L) 11/24/2021  ? HGB 11.9 (L) 11/24/2021  ?  HCT 35.5 (L) 11/24/2021  ? MCV 101.4 (H) 11/24/2021  ? PLT 95 (L) 11/24/2021  ? ?Lab Results  ?Component Value Date  ? FERRITIN 758 (H) 10/25/2020  ? IRON 69 10/25/2020  ? TIBC 321 10/25/2020  ? UIBC 252 10/25/2020  ? IRONPCTSAT 22 10/25/2020  ? ?Lab Results  ?Component Value Date  ? RBC 3.50 (L) 11/24/2021  ? ?No results found for: KPAFRELGTCHN, LAMBDASER, KAPLAMBRATIO ?No results found for: IGGSERUM, IGA, IGMSERUM ?No results found for: TOTALPROTELP, ALBUMINELP, A1GS, A2GS, BETS, BETA2SER, GAMS, MSPIKE, SPEI ?  Chemistry   ?   ?Component Value Date/Time  ? NA 144 11/24/2021 0946  ? NA 142 06/19/2021 1032  ? K 4.2 11/24/2021 0946  ? CL 107 11/24/2021 0946  ? CO2 30 11/24/2021 0946  ? BUN 14 11/24/2021 0946  ? BUN 12 06/19/2021 1032  ? CREATININE 0.82 11/24/2021 0946  ? CREATININE 1.01 03/26/2016 1009  ?    ?Component Value Date/Time  ? CALCIUM 9.5 11/24/2021 0946  ? ALKPHOS 131 (H) 11/24/2021 0946  ? AST 29 11/24/2021 0946  ? ALT 24 11/24/2021 0946  ? BILITOT 0.4 11/24/2021 0946  ?  ? ? ? ?Impression and Plan: John Hinton is a very pleasant 76 yo caucasian male with an atypical stage IV lung cancer which was previously resected followed by systemic chemotherapy and maintenance pembrolizumab.  ? ?We now have him on adagrasib.  John Hinton has done quite well with the adagrasib.  We will not change the dose.  His blood counts really not good enough that we can change the dose increase. ? ?I do not think we need another PET scan probably until August. ? ?I know John Hinton is planning on a trip with some fraternity brothers in June.  I do not see a problem with him going to that. ? ?I like to get him back after his trip.   ?  ?[name], MD ?5/8/202311:29 AM ? ?

## 2021-11-26 ENCOUNTER — Encounter: Payer: Self-pay | Admitting: Hematology & Oncology

## 2021-11-26 NOTE — Progress Notes (Signed)
Oncology Nurse Navigator Documentation ? ? ?  11/24/2021  ? 12:30 PM  ?Oncology Nurse Navigator Flowsheets  ?Navigator Follow Up Date: 01/12/2022  ?Navigator Follow Up Reason: Follow-up Appointment  ?Navigator Location [facility]  ?Navigator Encounter Type Appt/Treatment Plan Review  ?Patient Visit Type MedOnc  ?Treatment Phase Active Tx  ?Barriers/Navigation Needs Coordination of Care;Education  ?Interventions None Required  ?Acuity Level 2-Minimal Needs (1-2 Barriers Identified)  ?Support Groups/Services Friends and Family  ?Time Spent with Patient 15  ?  ?

## 2021-12-11 DIAGNOSIS — I10 Essential (primary) hypertension: Secondary | ICD-10-CM | POA: Diagnosis not present

## 2021-12-11 DIAGNOSIS — I5020 Unspecified systolic (congestive) heart failure: Secondary | ICD-10-CM | POA: Diagnosis not present

## 2021-12-11 DIAGNOSIS — C3412 Malignant neoplasm of upper lobe, left bronchus or lung: Secondary | ICD-10-CM | POA: Diagnosis not present

## 2021-12-11 DIAGNOSIS — Z86711 Personal history of pulmonary embolism: Secondary | ICD-10-CM | POA: Diagnosis not present

## 2021-12-11 DIAGNOSIS — I2510 Atherosclerotic heart disease of native coronary artery without angina pectoris: Secondary | ICD-10-CM | POA: Diagnosis not present

## 2021-12-16 ENCOUNTER — Other Ambulatory Visit: Payer: Medicare Other | Admitting: *Deleted

## 2021-12-16 DIAGNOSIS — I2510 Atherosclerotic heart disease of native coronary artery without angina pectoris: Secondary | ICD-10-CM

## 2021-12-16 DIAGNOSIS — E7800 Pure hypercholesterolemia, unspecified: Secondary | ICD-10-CM

## 2021-12-16 LAB — LIPID PANEL
Chol/HDL Ratio: 4.9 ratio (ref 0.0–5.0)
Cholesterol, Total: 232 mg/dL — ABNORMAL HIGH (ref 100–199)
HDL: 47 mg/dL (ref >39)
LDL Chol Calc (NIH): 156 mg/dL — ABNORMAL HIGH (ref 0–99)
Triglycerides: 158 mg/dL — ABNORMAL HIGH (ref 0–149)
VLDL Cholesterol Cal: 29 mg/dL (ref 5–40)

## 2021-12-16 LAB — ALT: ALT: 22 IU/L (ref 0–44)

## 2021-12-18 ENCOUNTER — Encounter: Payer: Self-pay | Admitting: Cardiology

## 2021-12-18 DIAGNOSIS — I2510 Atherosclerotic heart disease of native coronary artery without angina pectoris: Secondary | ICD-10-CM

## 2021-12-18 DIAGNOSIS — E7800 Pure hypercholesterolemia, unspecified: Secondary | ICD-10-CM

## 2021-12-19 ENCOUNTER — Encounter: Payer: Self-pay | Admitting: Cardiology

## 2021-12-19 DIAGNOSIS — E7800 Pure hypercholesterolemia, unspecified: Secondary | ICD-10-CM

## 2021-12-22 NOTE — Telephone Encounter (Signed)
Statin is ok!!  Lattie Haw, Rudell Cobb, MD   Sueanne Margarita, MD [facility] [place] are you ok with restarting statins? We doe have other options like the [id number]   Traci

## 2021-12-22 NOTE — Telephone Encounter (Signed)
Sueanne Margarita, MD to [facility] Triage     11:38 AM Please get into lipid clinic

## 2022-01-12 ENCOUNTER — Inpatient Hospital Stay: Payer: Medicare Other | Attending: Hematology & Oncology

## 2022-01-12 ENCOUNTER — Other Ambulatory Visit: Payer: Self-pay

## 2022-01-12 ENCOUNTER — Inpatient Hospital Stay (HOSPITAL_BASED_OUTPATIENT_CLINIC_OR_DEPARTMENT_OTHER): Payer: Medicare Other | Admitting: Hematology & Oncology

## 2022-01-12 ENCOUNTER — Inpatient Hospital Stay: Payer: Medicare Other

## 2022-01-12 ENCOUNTER — Encounter: Payer: Self-pay | Admitting: Hematology & Oncology

## 2022-01-12 VITALS — BP 132/77 | HR 54 | Temp 98.3°F | Resp 19 | Wt 184.0 lb

## 2022-01-12 DIAGNOSIS — C7801 Secondary malignant neoplasm of right lung: Secondary | ICD-10-CM | POA: Insufficient documentation

## 2022-01-12 DIAGNOSIS — C7951 Secondary malignant neoplasm of bone: Secondary | ICD-10-CM | POA: Insufficient documentation

## 2022-01-12 DIAGNOSIS — C3492 Malignant neoplasm of unspecified part of left bronchus or lung: Secondary | ICD-10-CM

## 2022-01-12 DIAGNOSIS — I2510 Atherosclerotic heart disease of native coronary artery without angina pectoris: Secondary | ICD-10-CM

## 2022-01-12 DIAGNOSIS — C7971 Secondary malignant neoplasm of right adrenal gland: Secondary | ICD-10-CM | POA: Diagnosis not present

## 2022-01-12 DIAGNOSIS — C3412 Malignant neoplasm of upper lobe, left bronchus or lung: Secondary | ICD-10-CM | POA: Diagnosis not present

## 2022-01-12 DIAGNOSIS — C7972 Secondary malignant neoplasm of left adrenal gland: Secondary | ICD-10-CM | POA: Diagnosis not present

## 2022-01-12 DIAGNOSIS — E7800 Pure hypercholesterolemia, unspecified: Secondary | ICD-10-CM

## 2022-01-12 LAB — CMP (CANCER CENTER ONLY)
ALT: 30 U/L (ref 0–44)
AST: 36 U/L (ref 15–41)
Albumin: 4.5 g/dL (ref 3.5–5.0)
Alkaline Phosphatase: 117 U/L (ref 38–126)
Anion gap: 5 (ref 5–15)
BUN: 15 mg/dL (ref 8–23)
CO2: 31 mmol/L (ref 22–32)
Calcium: 9.5 mg/dL (ref 8.9–10.3)
Chloride: 105 mmol/L (ref 98–111)
Creatinine: 1.03 mg/dL (ref 0.61–1.24)
GFR, Estimated: >60 mL/min (ref >60)
Glucose, Bld: 90 mg/dL (ref 70–99)
Potassium: 4.4 mmol/L (ref 3.5–5.1)
Sodium: 141 mmol/L (ref 135–145)
Total Bilirubin: 0.5 mg/dL (ref 0.3–1.2)
Total Protein: 7 g/dL (ref 6.5–8.1)

## 2022-01-12 LAB — CBC WITH DIFFERENTIAL (CANCER CENTER ONLY)
Abs Immature Granulocytes: 0.04 10*3/uL (ref 0.00–0.07)
Basophils Absolute: 0 10*3/uL (ref 0.0–0.1)
Basophils Relative: 0 %
Eosinophils Absolute: 0.2 10*3/uL (ref 0.0–0.5)
Eosinophils Relative: 3 %
HCT: 39.8 % (ref 39.0–52.0)
Hemoglobin: 13.4 g/dL (ref 13.0–17.0)
Immature Granulocytes: 1 %
Lymphocytes Relative: 5 %
Lymphs Abs: 0.3 10*3/uL — ABNORMAL LOW (ref 0.7–4.0)
MCH: 36.1 pg — ABNORMAL HIGH (ref 26.0–34.0)
MCHC: 33.7 g/dL (ref 30.0–36.0)
MCV: 107.3 fL — ABNORMAL HIGH (ref 80.0–100.0)
Monocytes Absolute: 0.5 10*3/uL (ref 0.1–1.0)
Monocytes Relative: 8 %
Neutro Abs: 5.1 10*3/uL (ref 1.7–7.7)
Neutrophils Relative %: 83 %
Platelet Count: 70 10*3/uL — ABNORMAL LOW (ref 150–400)
RBC: 3.71 MIL/uL — ABNORMAL LOW (ref 4.22–5.81)
RDW: 14.1 % (ref 11.5–15.5)
WBC Count: 6.2 10*3/uL (ref 4.0–10.5)
nRBC: 0 % (ref 0.0–0.2)

## 2022-01-12 LAB — LACTATE DEHYDROGENASE: LDH: 167 U/L (ref 98–192)

## 2022-01-12 MED ORDER — DENOSUMAB 120 MG/1.7ML ~~LOC~~ SOLN
120.0000 mg | Freq: Once | SUBCUTANEOUS | Status: AC
  Administered 2022-01-12: 120 mg via SUBCUTANEOUS
  Filled 2022-01-12: qty 1.7

## 2022-01-12 NOTE — Progress Notes (Signed)
Hematology and Oncology Follow Up Visit  John Hinton [id number] 1946-07-06 76 y.o. 01/12/2022   Principle Diagnosis:  Stage IVA (T3N2N0) adenocarcinoma of the LUL -- resected -- (+) KRAS -- progression - metastasis to right lung, bilateral adrenal glands, bony metastasis to T1, Y 10 and left iliac bone PE involving the right lower lobe and right middle lobe branches, no right heart strain noted  Past Therapy: S/P cycle 6 of Carbo/Alimta/Pembrolizumab Alimta/pembrolizumab-maintenance therapy s/p cycle 3/4  - start on 11/28/2020 --Alimta discontinued after cycle 2   Current Therapy:        Lumakras 360 mg po q day -- started on 07/31/2021 -- d/c on 02/07/203 for toxicity Xgeva every 3 months - due again 03/2022 Eliquis 5 mg PO BID Adagrasib  (Krazati)200 mg po BID -- start on 09/22/2021   Interim History:  John Hinton is here today for follow-up.  He really looks good.  I am so happy that he is doing as well as he is doing.  He has had no problems with the adagrasib.  He was down in [place] a week ago.  He was down with, I think, fraternity brothers, for a golf trip.  He comes by himself today.  His wife is up in [place] at a convention.  He is not having any pain.  He is not having any nausea or vomiting.  There is no diarrhea.  He has had no problems with back discomfort.  He has had no change in bowel or bladder habits.  He has had no cough or shortness of breath.  He is on Eliquis.  He is doing well on the Eliquis.  There is no bleeding.  Overall, I would say his performance status is probably ECOG 1.  .      Medications:  Allergies as of 01/12/2022       Reactions   Amlodipine Swelling   Swelling of ankles        Medication List        Accurate as of January 12, 2022 12:19 PM. If you have any questions, ask your nurse or doctor.          Adagrasib 200 MG Tabs Take 400 mg by mouth 2 (two) times daily.   apixaban 5 MG Tabs tablet Commonly known as:  ELIQUIS Take 1 tablet by mouth 2 times daily.   aspirin 81 MG tablet Take 81 mg by mouth at bedtime.   B-12 PO Take 1,000 mcg by mouth daily.   cholecalciferol 1000 units tablet Commonly known as: VITAMIN D Take 1,000 Units by mouth at bedtime.   colchicine 0.6 MG tablet Take 1 tablet (0.6 mg total) by mouth every 8 (eight) hours as needed.   diphenoxylate-atropine 2.5-0.025 MG tablet Commonly known as: LOMOTIL TAKE 2 TABLETS BY MOUTH AT ONSET OF DIARRHEA THEN TAKE 1 TABLET AFTER EACH LOOSE STOOL MAX 8 TABLETS PER DAY   Entresto 24-26 MG Generic drug: sacubitril-valsartan Take 1 tablet by mouth 2 (two) times daily.   ezetimibe 10 MG tablet Commonly known as: ZETIA Take 1 tablet (10 mg total) by mouth daily.   folic acid 1 MG tablet Commonly known as: FOLVITE TAKE ONE TABLET BY MOUTH DAILY START 5 TO 7 DAYS BEFORE ALIMTA CHEMO AND CONTINUE UNTIL 21 DAYS AFTER COMPLETED   methylPREDNISolone 4 MG Tbpk tablet Commonly known as: MEDROL DOSEPAK Take as prescribed: 6-5-4-3-2-1   metoprolol succinate 25 MG 24 hr tablet Commonly known as: TOPROL-XL Take 1 tablet (25 mg  total) by mouth at bedtime.   nitroGLYCERIN 0.4 MG SL tablet Commonly known as: NITROSTAT Place 1 tablet (0.4 mg total) under the tongue every 5 (five) minutes as needed for chest pain.   oxyCODONE-acetaminophen 7.5-325 MG tablet Commonly known as: Percocet Take 1 tablet by mouth every 4 (four) hours as needed for severe pain.   rosuvastatin 40 MG tablet Commonly known as: CRESTOR Take 1 tablet (40 mg total) by mouth at bedtime.   sertraline 100 MG tablet Commonly known as: ZOLOFT Take 100 mg by mouth 2 (two) times daily.   sucralfate 1 g tablet Commonly known as: Carafate Take 1 tablet (1 g total) by mouth 4 (four) times daily. Dissolve each tablet in 15 cc water before use.   traMADol 50 MG tablet Commonly known as: ULTRAM Take 1 tablet (50 mg total) by mouth every 6 (six) hours as needed.         Allergies:  Allergies  Allergen Reactions   Amlodipine Swelling    Swelling of ankles    Past Medical History, Surgical history, Social history, and Family History were reviewed and updated.  Review of Systems: Review of Systems  Constitutional: Negative.   HENT: Negative.    Eyes: Negative.   Respiratory: Negative.    Cardiovascular: Negative.   Gastrointestinal: Negative.   Genitourinary: Negative.   Musculoskeletal: Negative.   Skin: Negative.   Neurological: Negative.   Endo/Heme/Allergies: Negative.   Psychiatric/Behavioral: Negative.       Physical Exam:  weight is 184 lb (83.5 kg). His oral temperature is 98.3 F (36.8 C). His blood pressure is 132/77 and his pulse is 54 (abnormal). His respiration is 19 and oxygen saturation is 99%.   Wt Readings from Last 3 Encounters:  01/12/22 184 lb (83.5 kg)  11/24/21 179 lb (81.2 kg)  11/20/21 178 lb 6 oz (80.9 kg)    Physical Exam Vitals reviewed.  HENT:     Head: Normocephalic and atraumatic.  Eyes:     Pupils: Pupils are equal, round, and reactive to light.  Cardiovascular:     Rate and Rhythm: Normal rate and regular rhythm.     Heart sounds: Normal heart sounds.  Pulmonary:     Effort: Pulmonary effort is normal.     Breath sounds: Normal breath sounds.  Abdominal:     General: Bowel sounds are normal.     Palpations: Abdomen is soft.  Musculoskeletal:        General: No tenderness or deformity. Normal range of motion.     Cervical back: Normal range of motion.  Lymphadenopathy:     Cervical: No cervical adenopathy.  Skin:    General: Skin is warm and dry.     Findings: No erythema or rash.  Neurological:     Mental Status: He is alert and oriented to person, place, and time.  Psychiatric:        Behavior: Behavior normal.        Thought Content: Thought content normal.        Judgment: Judgment normal.     Lab Results  Component Value Date   WBC 6.2 01/12/2022   HGB 13.4 01/12/2022    HCT 39.8 01/12/2022   MCV 107.3 (H) 01/12/2022   PLT 70 (L) 01/12/2022   Lab Results  Component Value Date   FERRITIN 758 (H) 10/25/2020   IRON 69 10/25/2020   TIBC 321 10/25/2020   UIBC 252 10/25/2020   IRONPCTSAT 22 10/25/2020   Lab Results  Component Value Date   RBC 3.71 (L) 01/12/2022   No results found for: "KPAFRELGTCHN", "LAMBDASER", "KAPLAMBRATIO" No results found for: "IGGSERUM", "IGA", "IGMSERUM" No results found for: "TOTALPROTELP", "ALBUMINELP", "A1GS", "A2GS", "BETS", "BETA2SER", "GAMS", "MSPIKE", "SPEI"   Chemistry      Component Value Date/Time   NA 141 01/12/2022 1020   NA 142 06/19/2021 1032   K 4.4 01/12/2022 1020   CL 105 01/12/2022 1020   CO2 31 01/12/2022 1020   BUN 15 01/12/2022 1020   BUN 12 06/19/2021 1032   CREATININE 1.03 01/12/2022 1020   CREATININE 1.01 03/26/2016 1009      Component Value Date/Time   CALCIUM 9.5 01/12/2022 1020   ALKPHOS 117 01/12/2022 1020   AST 36 01/12/2022 1020   ALT 30 01/12/2022 1020   BILITOT 0.5 01/12/2022 1020       Impression and Plan: Mr. Adair is a very pleasant 76 yo caucasian male with an atypical stage IV lung cancer which was previously resected followed by systemic chemotherapy and maintenance pembrolizumab.   When he recurred, we put him on Lumakras.  He could not tolerate this because of elevated liver function studies.  We now have him on adagrasib.  He has done quite well with the adagrasib.  We will not change the dose.    We will plan for a PET scan in about a month.  I will plan to see him back afterwards.  The big news is that he and his wife will be going over to the [place], [place] in late September for the [employer].    Josph Macho, MD 6/26/202312:19 PM

## 2022-01-13 ENCOUNTER — Encounter: Payer: Self-pay | Admitting: *Deleted

## 2022-01-15 ENCOUNTER — Other Ambulatory Visit: Payer: Medicare Other

## 2022-01-15 ENCOUNTER — Telehealth: Payer: Self-pay | Admitting: Pharmacist

## 2022-01-15 ENCOUNTER — Ambulatory Visit: Payer: Medicare Other

## 2022-01-15 DIAGNOSIS — E7800 Pure hypercholesterolemia, unspecified: Secondary | ICD-10-CM | POA: Diagnosis not present

## 2022-01-15 LAB — HEPATIC FUNCTION PANEL
ALT: 38 IU/L (ref 0–44)
AST: 44 IU/L — ABNORMAL HIGH (ref 0–40)
Albumin: 4.5 g/dL (ref 3.7–4.7)
Alkaline Phosphatase: 176 IU/L — ABNORMAL HIGH (ref 44–121)
Bilirubin Total: 0.2 mg/dL (ref 0.0–1.2)
Bilirubin, Direct: 0.12 mg/dL (ref 0.00–0.40)
Total Protein: 6.5 g/dL (ref 6.0–8.5)

## 2022-01-15 NOTE — Telephone Encounter (Signed)
Pt came for LFT recheck today. Briefly discussed his cholesterol but canceled office visit. He's been back on rosuvastatin 40mg  daily for the past few weeks and never stopped taking his ezetimibe. He'll continue on both meds and repeat lipids/LFTs in mid August. Lipids previously well controlled on this regimen.

## 2022-01-15 NOTE — Progress Notes (Deleted)
Patient ID: John Hinton                 DOB: 15-Jun-1946                    MRN: [id number]     HPI: John Hinton is a 76 y.o. male patient referred to lipid clinic by Dr [name]. PMH is significant for CAD s/p CABG then PCI of SVG to OM1, repeat heart cath for unstable angina on 01/27/2017 showed severe focal in-stent restenosis in the SVG to OM stent s/p balloon angioplasty; all of the grafts were patent. Also has hx of dilated cardiomyopathy with both ischemic and nonischemic etiologies (most recent LVEF 45-50% on 05/05/21), HLD, HTN, OSA, PE 04/2021, stage 4a NSCLC s/p resection and chemo but has returned and metastasized to adrenal glands, bones, and spine and started new chemo 04/30/21.  Avoid branded meds due to cost? High deductible plan last year  Pt just needs to take rosuvastatin and zetia daily and recheck labs - LFTs today then add on lipids in another month  Current Medications: rosuvastatin 40mg  daily, ezetimibe 10mg  daily Risk Factors: progressive ASCVD, age, HTN LDL goal: 55mg /dL  Diet:   Exercise:   Family History: Cancer in his sister; Heart disease in his father; Heart failure in his father; Lung cancer in his father and mother  Social History: Former smoker (quit 1985)  Labs: 12/16/21: TC 232, TG 158, HDL 47, LDL 156  Past Medical History:  Diagnosis Date   BPH (benign prostatic hypertrophy)    Bradycardia    a. H/o asymptomatic bradycardia.   Coronary artery disease    a. s/p CABG ~2007 with LIMA to LAD, SVG seq to OM1 and OM2, SVG to PDA and SVG to diag. b. Abnormal nuc 03/2015 - s/p DES to SVG-OM1-OM2. 01/29/17 POBA with cutting balloon to SVG-->OM.  No ischemia Lexi myoview 2021   DCM (dilated cardiomyopathy) ([place]) 09/17/2015   mixed by cMRI 03/2021 with EF 39% with non viable infarct in basal to mid lateral wall and LGE pattern c/w nonischemic component as well. Mild RV dysfunction RVEF 42%.   Dyslipidemia    Dyspnea    r/t lung mass   ED (erectile  dysfunction)    Fear of heights    Goals of care, counseling/discussion 06/19/2020   History of radiation therapy    T spine and left ilium 10/07/2021-10/20/2021  Dr Gery Pray   Hypertension    Non-small cell carcinoma of lung, stage 4, left ([place]) 06/19/2020   OSA (obstructive sleep apnea) 07/12/2015   Moderate OSA with AHI 16.5/hr now on CPAP at 8cm H2O, uses cpap nightly      RBBB    Shoulder, capsulitis, adhesive    Left Shoulder    Current Outpatient Medications on File Prior to Visit  Medication Sig Dispense Refill   Adagrasib 200 MG TABS Take 400 mg by mouth 2 (two) times daily. 120 tablet 4   apixaban (ELIQUIS) 5 MG TABS tablet Take 1 tablet by mouth 2 times daily. 60 tablet 3   aspirin 81 MG tablet Take 81 mg by mouth at bedtime.      cholecalciferol (VITAMIN D) 1000 UNITS tablet Take 1,000 Units by mouth at bedtime.      colchicine 0.6 MG tablet Take 1 tablet (0.6 mg total) by mouth every 8 (eight) hours as needed. 60 tablet 1   Cyanocobalamin (B-12 PO) Take 1,000 mcg by mouth daily.      diphenoxylate-atropine (  LOMOTIL) 2.5-0.025 MG tablet TAKE 2 TABLETS BY MOUTH AT ONSET OF DIARRHEA THEN TAKE 1 TABLET AFTER EACH LOOSE STOOL MAX 8 TABLETS PER DAY (Patient not taking: Reported on 11/24/2021) 100 tablet 0   ezetimibe (ZETIA) 10 MG tablet Take 1 tablet (10 mg total) by mouth daily. 90 tablet 3   folic acid (FOLVITE) 1 MG tablet TAKE ONE TABLET BY MOUTH DAILY START 5 TO 7 DAYS BEFORE ALIMTA CHEMO AND CONTINUE UNTIL 21 DAYS AFTER COMPLETED 90 tablet 3   methylPREDNISolone (MEDROL DOSEPAK) 4 MG TBPK tablet Take as prescribed: 6-5-4-3-2-1 21 tablet 3   metoprolol succinate (TOPROL-XL) 25 MG 24 hr tablet Take 1 tablet (25 mg total) by mouth at bedtime. 90 tablet 3   nitroGLYCERIN (NITROSTAT) 0.4 MG SL tablet Place 1 tablet (0.4 mg total) under the tongue every 5 (five) minutes as needed for chest pain. (Patient not taking: Reported on 11/24/2021) 25 tablet 3   oxyCODONE-acetaminophen  (PERCOCET) 7.5-325 MG tablet Take 1 tablet by mouth every 4 (four) hours as needed for severe pain. (Patient not taking: Reported on 11/24/2021) 60 tablet 0   rosuvastatin (CRESTOR) 40 MG tablet Take 1 tablet (40 mg total) by mouth at bedtime. 90 tablet 3   sacubitril-valsartan (ENTRESTO) 24-26 MG Take 1 tablet by mouth 2 (two) times daily. 60 tablet 11   sertraline (ZOLOFT) 100 MG tablet Take 100 mg by mouth 2 (two) times daily.     sucralfate (CARAFATE) 1 g tablet Take 1 tablet (1 g total) by mouth 4 (four) times daily. Dissolve each tablet in 15 cc water before use. 120 tablet 2   traMADol (ULTRAM) 50 MG tablet Take 1 tablet (50 mg total) by mouth every 6 (six) hours as needed. 90 tablet 0   [DISCONTINUED] prochlorperazine (COMPAZINE) 10 MG tablet Take 1 tablet (10 mg total) by mouth every 6 (six) hours as needed (Nausea or vomiting). 30 tablet 1   No current facility-administered medications on file prior to visit.    Allergies  Allergen Reactions   Amlodipine Swelling    Swelling of ankles    Assessment/Plan:  1. Hyperlipidemia -

## 2022-01-15 NOTE — Telephone Encounter (Signed)
Called pt - he's scheduled for lipid appt today but doesn't need an appt, just needs LFTs checked since he restarted his rosuvastatin 2 weeks ago, then needs lipids checked in another month. Statin had been on hold per oncology due to LFT issue but he's since resumed and lipids were previously well controlled on therapy.  Left message for pt.

## 2022-01-23 ENCOUNTER — Telehealth: Payer: Self-pay

## 2022-01-23 DIAGNOSIS — E7800 Pure hypercholesterolemia, unspecified: Secondary | ICD-10-CM

## 2022-01-23 DIAGNOSIS — I519 Heart disease, unspecified: Secondary | ICD-10-CM

## 2022-01-23 NOTE — Telephone Encounter (Signed)
The patient has been notified of the result and verbalized understanding.  All questions (if any) were answered. Antonieta Iba, RN 01/23/2022 11:33 AM  Repeat labs have been scheduled.

## 2022-01-23 NOTE — Telephone Encounter (Signed)
-----  Message from Sueanne Margarita, MD sent at 01/15/2022  9:59 PM EDT ----- Please forward to his oncologist to make them aware of his uptrending alk phos. Repeat LFTs in 4 weeks

## 2022-01-26 ENCOUNTER — Encounter: Payer: Self-pay | Admitting: Hematology & Oncology

## 2022-02-09 ENCOUNTER — Other Ambulatory Visit: Payer: Medicare Other

## 2022-02-09 DIAGNOSIS — E7800 Pure hypercholesterolemia, unspecified: Secondary | ICD-10-CM

## 2022-02-09 LAB — HEPATIC FUNCTION PANEL
ALT: 29 IU/L (ref 0–44)
AST: 35 IU/L (ref 0–40)
Albumin: 4.7 g/dL (ref 3.8–4.8)
Alkaline Phosphatase: 189 IU/L — ABNORMAL HIGH (ref 44–121)
Bilirubin Total: 0.3 mg/dL (ref 0.0–1.2)
Bilirubin, Direct: 0.14 mg/dL (ref 0.00–0.40)
Total Protein: 6.8 g/dL (ref 6.0–8.5)

## 2022-02-11 ENCOUNTER — Ambulatory Visit (HOSPITAL_COMMUNITY)
Admission: RE | Admit: 2022-02-11 | Discharge: 2022-02-11 | Disposition: A | Discharge status: Home / Self Care | Payer: Medicare Other | Source: Ambulatory Visit | Attending: Hematology & Oncology | Admitting: Hematology & Oncology

## 2022-02-11 DIAGNOSIS — C7970 Secondary malignant neoplasm of unspecified adrenal gland: Secondary | ICD-10-CM | POA: Insufficient documentation

## 2022-02-11 DIAGNOSIS — C3492 Malignant neoplasm of unspecified part of left bronchus or lung: Secondary | ICD-10-CM

## 2022-02-11 DIAGNOSIS — C3490 Malignant neoplasm of unspecified part of unspecified bronchus or lung: Secondary | ICD-10-CM | POA: Diagnosis not present

## 2022-02-11 DIAGNOSIS — I700 Atherosclerosis of aorta: Secondary | ICD-10-CM | POA: Insufficient documentation

## 2022-02-11 DIAGNOSIS — K429 Umbilical hernia without obstruction or gangrene: Secondary | ICD-10-CM | POA: Diagnosis not present

## 2022-02-11 LAB — GLUCOSE, CAPILLARY: Glucose-Capillary: 100 mg/dL — ABNORMAL HIGH (ref 70–99)

## 2022-02-11 MED ORDER — FLUDEOXYGLUCOSE F - 18 (FDG) INJECTION
9.2000 | Freq: Once | INTRAVENOUS | Status: AC | PRN
  Administered 2022-02-11: 9.2 via INTRAVENOUS

## 2022-02-12 ENCOUNTER — Encounter: Payer: Self-pay | Admitting: *Deleted

## 2022-02-12 NOTE — Progress Notes (Signed)
PET shows continued treatment response.  Oncology Nurse Navigator Documentation     02/12/2022    8:00 AM  Oncology Nurse Navigator Flowsheets  Navigator Follow Up Date: 02/23/2022  Navigator Follow Up Reason: Follow-up Appointment  Navigator Location [facility]  Navigator Encounter Type Scan Review  Patient Visit Type MedOnc  Treatment Phase Active Tx  Barriers/Navigation Needs Coordination of Care;Education  Interventions None Required  Acuity Level 2-Minimal Needs (1-2 Barriers Identified)  Support Groups/Services Friends and Family  Time Spent with Patient 15

## 2022-02-23 ENCOUNTER — Other Ambulatory Visit: Payer: Self-pay

## 2022-02-23 ENCOUNTER — Inpatient Hospital Stay: Payer: Medicare Other | Attending: Hematology & Oncology

## 2022-02-23 ENCOUNTER — Inpatient Hospital Stay (HOSPITAL_BASED_OUTPATIENT_CLINIC_OR_DEPARTMENT_OTHER): Payer: Medicare Other | Admitting: Hematology & Oncology

## 2022-02-23 ENCOUNTER — Encounter: Payer: Self-pay | Admitting: Hematology & Oncology

## 2022-02-23 VITALS — BP 129/72 | HR 86 | Temp 97.7°F | Resp 18 | Wt 186.0 lb

## 2022-02-23 DIAGNOSIS — C7951 Secondary malignant neoplasm of bone: Secondary | ICD-10-CM | POA: Insufficient documentation

## 2022-02-23 DIAGNOSIS — I2699 Other pulmonary embolism without acute cor pulmonale: Secondary | ICD-10-CM | POA: Insufficient documentation

## 2022-02-23 DIAGNOSIS — C7972 Secondary malignant neoplasm of left adrenal gland: Secondary | ICD-10-CM | POA: Insufficient documentation

## 2022-02-23 DIAGNOSIS — C3492 Malignant neoplasm of unspecified part of left bronchus or lung: Secondary | ICD-10-CM | POA: Diagnosis not present

## 2022-02-23 DIAGNOSIS — C7971 Secondary malignant neoplasm of right adrenal gland: Secondary | ICD-10-CM | POA: Diagnosis not present

## 2022-02-23 DIAGNOSIS — Z79899 Other long term (current) drug therapy: Secondary | ICD-10-CM | POA: Diagnosis not present

## 2022-02-23 DIAGNOSIS — C3412 Malignant neoplasm of upper lobe, left bronchus or lung: Secondary | ICD-10-CM | POA: Insufficient documentation

## 2022-02-23 LAB — LACTATE DEHYDROGENASE: LDH: 160 U/L (ref 98–192)

## 2022-02-23 LAB — CMP (CANCER CENTER ONLY)
ALT: 26 U/L (ref 0–44)
AST: 31 U/L (ref 15–41)
Albumin: 4.9 g/dL (ref 3.5–5.0)
Alkaline Phosphatase: 118 U/L (ref 38–126)
Anion gap: 9 (ref 5–15)
BUN: 24 mg/dL — ABNORMAL HIGH (ref 8–23)
CO2: 27 mmol/L (ref 22–32)
Calcium: 9.9 mg/dL (ref 8.9–10.3)
Chloride: 106 mmol/L (ref 98–111)
Creatinine: 1.16 mg/dL (ref 0.61–1.24)
GFR, Estimated: >60 mL/min (ref >60)
Glucose, Bld: 119 mg/dL — ABNORMAL HIGH (ref 70–99)
Potassium: 4.3 mmol/L (ref 3.5–5.1)
Sodium: 142 mmol/L (ref 135–145)
Total Bilirubin: 0.6 mg/dL (ref 0.3–1.2)
Total Protein: 7.3 g/dL (ref 6.5–8.1)

## 2022-02-23 LAB — CBC WITH DIFFERENTIAL (CANCER CENTER ONLY)
Abs Immature Granulocytes: 0.02 10*3/uL (ref 0.00–0.07)
Basophils Absolute: 0 10*3/uL (ref 0.0–0.1)
Basophils Relative: 0 %
Eosinophils Absolute: 0.2 10*3/uL (ref 0.0–0.5)
Eosinophils Relative: 4 %
HCT: 37.3 % — ABNORMAL LOW (ref 39.0–52.0)
Hemoglobin: 12.7 g/dL — ABNORMAL LOW (ref 13.0–17.0)
Immature Granulocytes: 0 %
Lymphocytes Relative: 8 %
Lymphs Abs: 0.4 10*3/uL — ABNORMAL LOW (ref 0.7–4.0)
MCH: 36.1 pg — ABNORMAL HIGH (ref 26.0–34.0)
MCHC: 34 g/dL (ref 30.0–36.0)
MCV: 106 fL — ABNORMAL HIGH (ref 80.0–100.0)
Monocytes Absolute: 0.5 10*3/uL (ref 0.1–1.0)
Monocytes Relative: 9 %
Neutro Abs: 4.1 10*3/uL (ref 1.7–7.7)
Neutrophils Relative %: 79 %
Platelet Count: 130 10*3/uL — ABNORMAL LOW (ref 150–400)
RBC: 3.52 MIL/uL — ABNORMAL LOW (ref 4.22–5.81)
RDW: 13.5 % (ref 11.5–15.5)
WBC Count: 5.2 10*3/uL (ref 4.0–10.5)
nRBC: 0 % (ref 0.0–0.2)

## 2022-02-23 MED ORDER — METHYLPREDNISOLONE 4 MG PO TBPK: ORAL_TABLET | ORAL | 4 refills | Status: DC

## 2022-02-23 NOTE — Progress Notes (Signed)
Hematology and Oncology Follow Up Visit  John Hinton [id number] [birth year] 76 y.o. 02/23/2022   Principle Diagnosis:  Stage IVA (T3N2N0) adenocarcinoma of the LUL -- resected -- (+) KRAS -- progression - metastasis to right lung, bilateral adrenal glands, bony metastasis to T1, Y 10 and left iliac bone PE involving the right lower lobe and right middle lobe branches, no right heart strain noted  Past Therapy: S/P cycle 6 of Carbo/Alimta/Pembrolizumab Alimta/pembrolizumab-maintenance therapy s/p cycle 3/4  - start on 11/28/2020 --Alimta discontinued after cycle 2   Current Therapy:        Lumakras 360 mg po q day -- started on 07/31/2021 -- d/c on 02/07/203 for toxicity Xgeva every 3 months - due again 03/2022 Eliquis 5 mg PO BID Adagrasib  (Krazati)200 mg po BID -- start on 09/22/2021   Interim History:  [name] is here today for follow-up.  As expected, he and his wife will be going on trips.  They are leaving at the there is a new to [place].  They come back.  They go out to [place].  They come back and then finally, they are going out to [place] for the [employer] in late September.  He looks great.  He feels good.  He is gaining weight.  He is eating well.  He has had no nausea or vomiting.  He has had no cough or shortness of breath.  We did do a PET scan on him.  The PET scan was done on 02/11/2022.  The PET scan bases showed stable disease.  I am incredibly happy about this.  He is doing well with the [place].  He has had no headache.  There is been no rashes.  He is on Eliquis because of history of pulmonary embolism.  He is being followed by cardiology.  Currently, I would say his performance status is probably ECOG 1.       Medications:  Allergies as of 02/23/2022       Reactions   Amlodipine Swelling   Swelling of ankles        Medication List        Accurate as of February 23, 2022 12:37 PM. If you have any questions, ask your nurse or doctor.           adagrasib 200 MG tablet Commonly known as: KRAZATI Take 400 mg by mouth 2 (two) times daily.   apixaban 5 MG Tabs tablet Commonly known as: ELIQUIS Take 1 tablet by mouth 2 times daily.   aspirin 81 MG tablet Take 81 mg by mouth at bedtime.   B-12 PO Take 1,000 mcg by mouth daily.   cholecalciferol 1000 units tablet Commonly known as: VITAMIN D Take 1,000 Units by mouth at bedtime.   colchicine 0.6 MG tablet Take 1 tablet (0.6 mg total) by mouth every 8 (eight) hours as needed.   diphenoxylate-atropine 2.5-0.025 MG tablet Commonly known as: LOMOTIL TAKE 2 TABLETS BY MOUTH AT ONSET OF DIARRHEA THEN TAKE 1 TABLET AFTER EACH LOOSE STOOL MAX 8 TABLETS PER DAY   Entresto 24-26 MG Generic drug: sacubitril-valsartan Take 1 tablet by mouth 2 (two) times daily.   ezetimibe 10 MG tablet Commonly known as: ZETIA Take 1 tablet (10 mg total) by mouth daily.   folic acid 1 MG tablet Commonly known as: FOLVITE TAKE ONE TABLET BY MOUTH DAILY START 5 TO 7 DAYS BEFORE ALIMTA CHEMO AND CONTINUE UNTIL 21 DAYS AFTER COMPLETED   methylPREDNISolone 4 MG Tbpk tablet  Commonly known as: MEDROL DOSEPAK Take as prescribed: 6-5-4-3-2-1   metoprolol succinate 25 MG 24 hr tablet Commonly known as: TOPROL-XL Take 1 tablet (25 mg total) by mouth at bedtime.   nitroGLYCERIN 0.4 MG SL tablet Commonly known as: NITROSTAT Place 1 tablet (0.4 mg total) under the tongue every 5 (five) minutes as needed for chest pain.   oxyCODONE-acetaminophen 7.5-325 MG tablet Commonly known as: Percocet Take 1 tablet by mouth every 4 (four) hours as needed for severe pain.   rosuvastatin 40 MG tablet Commonly known as: CRESTOR Take 1 tablet (40 mg total) by mouth at bedtime.   sertraline 100 MG tablet Commonly known as: ZOLOFT Take 100 mg by mouth 2 (two) times daily.   sucralfate 1 g tablet Commonly known as: Carafate Take 1 tablet (1 g total) by mouth 4 (four) times daily. Dissolve each tablet in  15 cc water before use.   traMADol 50 MG tablet Commonly known as: ULTRAM Take 1 tablet (50 mg total) by mouth every 6 (six) hours as needed.        Allergies:  Allergies  Allergen Reactions   Amlodipine Swelling    Swelling of ankles    Past Medical History, Surgical history, Social history, and Family History were reviewed and updated.  Review of Systems: Review of Systems  Constitutional: Negative.   HENT: Negative.    Eyes: Negative.   Respiratory: Negative.    Cardiovascular: Negative.   Gastrointestinal: Negative.   Genitourinary: Negative.   Musculoskeletal: Negative.   Skin: Negative.   Neurological: Negative.   Endo/Heme/Allergies: Negative.   Psychiatric/Behavioral: Negative.       Physical Exam:  weight is 186 lb (84.4 kg). His oral temperature is 97.7 F (36.5 C). His blood pressure is 129/72 and his pulse is 86. His respiration is 18 and oxygen saturation is 100%.   Wt Readings from Last 3 Encounters:  02/23/22 186 lb (84.4 kg)  01/12/22 184 lb (83.5 kg)  11/24/21 179 lb (81.2 kg)    Physical Exam Vitals reviewed.  HENT:     Head: Normocephalic and atraumatic.  Eyes:     Pupils: Pupils are equal, round, and reactive to light.  Cardiovascular:     Rate and Rhythm: Normal rate and regular rhythm.     Heart sounds: Normal heart sounds.  Pulmonary:     Effort: Pulmonary effort is normal.     Breath sounds: Normal breath sounds.  Abdominal:     General: Bowel sounds are normal.     Palpations: Abdomen is soft.  Musculoskeletal:        General: No tenderness or deformity. Normal range of motion.     Cervical back: Normal range of motion.  Lymphadenopathy:     Cervical: No cervical adenopathy.  Skin:    General: Skin is warm and dry.     Findings: No erythema or rash.  Neurological:     Mental Status: He is alert and oriented to person, place, and time.  Psychiatric:        Behavior: Behavior normal.        Thought Content: Thought  content normal.        Judgment: Judgment normal.     Lab Results  Component Value Date   WBC 5.2 02/23/2022   HGB 12.7 (L) 02/23/2022   HCT 37.3 (L) 02/23/2022   MCV 106.0 (H) 02/23/2022   PLT 130 (L) 02/23/2022   Lab Results  Component Value Date   FERRITIN 758 (  H) 10/25/2020   IRON 69 10/25/2020   TIBC 321 10/25/2020   UIBC 252 10/25/2020   IRONPCTSAT 22 10/25/2020   Lab Results  Component Value Date   RBC 3.52 (L) 02/23/2022   No results found for: "KPAFRELGTCHN", "LAMBDASER", "KAPLAMBRATIO" No results found for: "IGGSERUM", "IGA", "IGMSERUM" No results found for: "TOTALPROTELP", "ALBUMINELP", "A1GS", "A2GS", "BETS", "BETA2SER", "GAMS", "MSPIKE", "SPEI"   Chemistry      Component Value Date/Time   NA 142 02/23/2022 1144   NA 142 06/19/2021 1032   K 4.3 02/23/2022 1144   CL 106 02/23/2022 1144   CO2 27 02/23/2022 1144   BUN 24 (H) 02/23/2022 1144   BUN 12 06/19/2021 1032   CREATININE 1.16 02/23/2022 1144   CREATININE 1.01 03/26/2016 1009      Component Value Date/Time   CALCIUM 9.9 02/23/2022 1144   ALKPHOS 118 02/23/2022 1144   AST 31 02/23/2022 1144   ALT 26 02/23/2022 1144   BILITOT 0.6 02/23/2022 1144       Impression and Plan: Mr. Tolan is a very pleasant 76 yo caucasian male with an atypical stage IV lung cancer which was previously resected followed by systemic chemotherapy and maintenance pembrolizumab.   When he recurred, we put him on Lumakras.  He could not tolerate this because of elevated liver function studies.  We now have him on adagrasib.  His liver function studies are normal.  He looks fantastic.  Hopefully, his heart is doing okay.  I do not think we need another PET scan probably until October.  I would like to see him back in September.  I want to see him back before he goes over to [place].  I did send in a prescription for a Medrol Dosepak.  He likes to take these when he goes on vacation.     [name],  MD 8/7/202312:37 PM

## 2022-02-24 ENCOUNTER — Encounter: Payer: Self-pay | Admitting: *Deleted

## 2022-02-24 NOTE — Progress Notes (Signed)
Oncology Nurse Navigator Documentation     02/24/2022   11:15 AM  Oncology Nurse Navigator Flowsheets  Navigator Follow Up Date: 04/13/2022  Navigator Follow Up Reason: Follow-up Appointment  Navigator Location [facility]  Navigator Encounter Type Appt/Treatment Plan Review  Patient Visit Type MedOnc  Treatment Phase Active Tx  Barriers/Navigation Needs No Barriers At This Time  Interventions None Required  Acuity Level 1-No Barriers  Support Groups/Services Friends and Family  Time Spent with Patient 15

## 2022-02-28 ENCOUNTER — Other Ambulatory Visit: Payer: Self-pay | Admitting: Hematology & Oncology

## 2022-02-28 DIAGNOSIS — C3492 Malignant neoplasm of unspecified part of left bronchus or lung: Secondary | ICD-10-CM

## 2022-03-01 ENCOUNTER — Encounter: Payer: Self-pay | Admitting: Hematology & Oncology

## 2022-03-02 ENCOUNTER — Other Ambulatory Visit: Payer: Medicare Other

## 2022-03-02 ENCOUNTER — Encounter: Payer: Self-pay | Admitting: Hematology & Oncology

## 2022-03-03 ENCOUNTER — Other Ambulatory Visit: Payer: Medicare Other

## 2022-03-03 DIAGNOSIS — E7800 Pure hypercholesterolemia, unspecified: Secondary | ICD-10-CM | POA: Diagnosis not present

## 2022-03-03 LAB — HEPATIC FUNCTION PANEL
ALT: 24 IU/L (ref 0–44)
AST: 25 IU/L (ref 0–40)
Albumin: 4.4 g/dL (ref 3.8–4.8)
Alkaline Phosphatase: 109 IU/L (ref 44–121)
Bilirubin Total: 0.4 mg/dL (ref 0.0–1.2)
Bilirubin, Direct: 0.15 mg/dL (ref 0.00–0.40)
Total Protein: 6.4 g/dL (ref 6.0–8.5)

## 2022-03-03 LAB — LIPID PANEL
Chol/HDL Ratio: 2.6 ratio (ref 0.0–5.0)
Cholesterol, Total: 151 mg/dL (ref 100–199)
HDL: 58 mg/dL (ref >39)
LDL Chol Calc (NIH): 77 mg/dL (ref 0–99)
Triglycerides: 87 mg/dL (ref 0–149)
VLDL Cholesterol Cal: 16 mg/dL (ref 5–40)

## 2022-03-03 NOTE — Addendum Note (Signed)
Addended by: Willeen Cass A on: 03/03/2022 11:54 AM   Modules accepted: Orders

## 2022-03-04 ENCOUNTER — Encounter: Payer: Self-pay | Admitting: *Deleted

## 2022-03-04 DIAGNOSIS — C3492 Malignant neoplasm of unspecified part of left bronchus or lung: Secondary | ICD-10-CM

## 2022-03-04 NOTE — Progress Notes (Signed)
Patient requested a referral to Physical Therapy due to c/o loss of muscle mass and strength. Referral order placed.   See MyChart communication dated 03/01/22.  Oncology Nurse Navigator Documentation     03/04/2022    8:00 AM  Oncology Nurse Navigator Flowsheets  Navigator Follow Up Date: 04/13/2022  Navigator Follow Up Reason: Follow-up Appointment  Navigator Location [facility]  Navigator Encounter Type Appt/Treatment Plan Review  Patient Visit Type MedOnc  Treatment Phase Active Tx  Barriers/Navigation Needs No Barriers At This Time  Interventions Referrals  Acuity Level 1-No Barriers  Referrals Physical Therapy  Support Groups/Services Friends and Family  Time Spent with Patient 15

## 2022-03-19 ENCOUNTER — Other Ambulatory Visit (HOSPITAL_COMMUNITY): Payer: Self-pay

## 2022-03-27 DIAGNOSIS — Z23 Encounter for immunization: Secondary | ICD-10-CM | POA: Diagnosis not present

## 2022-04-02 DIAGNOSIS — I2510 Atherosclerotic heart disease of native coronary artery without angina pectoris: Secondary | ICD-10-CM | POA: Diagnosis not present

## 2022-04-02 DIAGNOSIS — E7800 Pure hypercholesterolemia, unspecified: Secondary | ICD-10-CM | POA: Diagnosis not present

## 2022-04-02 DIAGNOSIS — I10 Essential (primary) hypertension: Secondary | ICD-10-CM | POA: Diagnosis not present

## 2022-04-08 NOTE — Therapy (Signed)
OUTPATIENT PHYSICAL THERAPY LOWER EXTREMITY EVALUATION   Patient Name: John Hinton MRN: [id number] DOB:June 19, 1946, 76 y.o., male Today's Date: 04/09/2022   PT End of Session - 04/09/22 1248     Visit Number 1    Number of Visits 12    Date for PT Re-Evaluation 05/21/22    Authorization Type MCR A and B    Progress Note Due on Visit 10    PT Start Time 1148    PT Stop Time 1248    PT Time Calculation (min) 60 min    Activity Tolerance Patient tolerated treatment well    Behavior During Therapy WFL for tasks assessed/performed             Past Medical History:  Diagnosis Date   BPH (benign prostatic hypertrophy)    Bradycardia    a. H/o asymptomatic bradycardia.   Coronary artery disease    a. s/p CABG ~2007 with LIMA to LAD, SVG seq to OM1 and OM2, SVG to PDA and SVG to diag. b. Abnormal nuc 03/2015 - s/p DES to SVG-OM1-OM2. 01/29/17 POBA with cutting balloon to SVG-->OM.  No ischemia Lexi myoview 2021   DCM (dilated cardiomyopathy) ([place]) 09/17/2015   mixed by cMRI 03/2021 with EF 39% with non viable infarct in basal to mid lateral wall and LGE pattern c/w nonischemic component as well. Mild RV dysfunction RVEF 42%.   Dyslipidemia    Dyspnea    r/t lung mass   ED (erectile dysfunction)    Fear of heights    Goals of care, counseling/discussion 06/19/2020   History of radiation therapy    T spine and left ilium 10/07/2021-10/20/2021  Dr Gery Pray   Hypertension    Non-small cell carcinoma of lung, stage 4, left ([place]) 06/19/2020   OSA (obstructive sleep apnea) 07/12/2015   Moderate OSA with AHI 16.5/hr now on CPAP at 8cm H2O, uses cpap nightly      RBBB    Shoulder, capsulitis, adhesive    Left Shoulder   Past Surgical History:  Procedure Laterality Date   APPENDECTOMY     BRONCHIAL BIOPSY  05/09/2020   Procedure: BRONCHIAL BIOPSIES;  Surgeon: [name], DO;  Location: [facility];  Service: Pulmonary;;   BRONCHIAL BRUSHINGS  05/09/2020   Procedure:  BRONCHIAL BRUSHINGS;  Surgeon: [name], DO;  Location: [facility];  Service: Pulmonary;;   BRONCHIAL NEEDLE ASPIRATION BIOPSY  05/09/2020   Procedure: BRONCHIAL NEEDLE ASPIRATION BIOPSIES;  Surgeon: [name], DO;  Location: [facility];  Service: Pulmonary;;   BRONCHIAL WASHINGS  05/09/2020   Procedure: BRONCHIAL WASHINGS;  Surgeon: [name], DO;  Location: [facility];  Service: Pulmonary;;   CARDIAC CATHETERIZATION N/A 04/05/2015   Procedure: Left Heart Cath and Coronary Angiography;  Surgeon: Sherren Mocha, MD;  Location: [facility];  Service: Cardiovascular;  Laterality: N/A;   COLONOSCOPY     CORONARY ARTERY BYPASS GRAFT     w/LIMA to LAD, seq SVG to OM1 and OM2, SVG to PDA and SVG to Diagonal   CORONARY BALLOON ANGIOPLASTY N/A 01/29/2017   Procedure: Coronary Balloon Angioplasty;  Surgeon: Martinique, Peter M, MD;  Location: [facility];  Service: Cardiovascular;  Laterality: N/A;   FIDUCIAL MARKER PLACEMENT  05/09/2020   Procedure: FIDUCIAL MARKER PLACEMENT;  Surgeon: [name], DO;  Location: [facility];  Service: Pulmonary;;   INTERCOSTAL NERVE BLOCK Left 05/22/2020   Procedure: INTERCOSTAL NERVE BLOCK;  Surgeon: Lajuana Matte, MD;  Location:  [facility];  Service: Thoracic;  Laterality: Left;   LEFT HEART CATH AND CORS/GRAFTS ANGIOGRAPHY N/A 01/29/2017   Procedure: Left Heart Cath and Cors/Grafts Angiography;  Surgeon: Martinique, Peter M, MD;  Location: [facility];  Service: Cardiovascular;  Laterality: N/A;   NODE DISSECTION N/A 05/22/2020   Procedure: NODE DISSECTION;  Surgeon: Lajuana Matte, MD;  Location: [facility];  Service: Thoracic;  Laterality: N/A;   VIDEO BRONCHOSCOPY WITH ENDOBRONCHIAL NAVIGATION N/A 05/09/2020   Procedure: VIDEO BRONCHOSCOPY WITH ENDOBRONCHIAL NAVIGATION;  Surgeon: [name], DO;  Location: [facility];  Service: Pulmonary;  Laterality: N/A;   VIDEO BRONCHOSCOPY WITH ENDOBRONCHIAL ULTRASOUND N/A  05/09/2020   Procedure: VIDEO BRONCHOSCOPY WITH ENDOBRONCHIAL ULTRASOUND;  Surgeon: [name], DO;  Location: [facility];  Service: Pulmonary;  Laterality: N/A;   Patient Active Problem List   Diagnosis Date Noted   Malignant neoplasm of lung ([place]) 05/29/2021   Acute pulmonary embolism ([place]) 05/04/2021   Non-small cell carcinoma of lung, stage 4, left ([place]) 06/19/2020   Goals of care, counseling/discussion 06/19/2020   S/P partial lobectomy of lung 05/22/2020   DCM (dilated cardiomyopathy) ([place]) 09/17/2015   OSA (obstructive sleep apnea) 07/12/2015   Bradycardia 08/29/2013   CAD (coronary artery disease), native coronary artery    RBBB    Essential hypertension, benign 08/18/2013   Hyperlipemia 08/18/2013     REFERRING PROVIDER: [name], MD  REFERRING DIAG: C34.92 (ICD-10-CM) - Non-small cell carcinoma of lung, stage 4, left    THERAPY DIAG:  Muscle weakness (generalized)  Rationale for Evaluation and Treatment Rehabilitation  ONSET DATE: PT order 03/04/2022  SUBJECTIVE:   SUBJECTIVE STATEMENT: -Pt was dx'd with Lung CA approx 2 years ago and had a lobectomy.  He has undergone chemo and radiation. Pt currently taking chemo orally.  Pt had a PET scan on 02/11/2022 which showed stable disease per MD note.  MD note also indicated he looks great, feels good, is gaining weight, and has had no cough or shortness of breath.  He was hospitalized on 05/06/2021 due to chest pain and shortness of breath with confusion. He was found to have submassive PE and was discharged home on Eliquis.  Pt and wife denies any further issues with PE's.    MD notes indicated pt requested a referral to Physical Therapy due to c/o loss of muscle mass and strength.   PT order indicated Patient receiving chemotherapy for lung cancer and c/o loss of strength and endurance.   Pt becomes fatigued and SOB with performing stairs.  Pt is limited with ambulation distance due to fatigue and SOB.   He has started walking 10-15 mins in neighborhood in the past 1-2 months.  Pt hasn't played golf in approx 1.5 - 2 years.  Pt's wife states he has loss a lot of muscle mass and had difficulty getting off of the floor.  Pt reports he has lost upper and lower body strength.  Pt has been working with a Physiological scientist for about 5 weeks once per week.  He states he can some have some minor back pain at the end of a session and is fatigued the following day.     PERTINENT HISTORY: -Stage 4 Lung CA and thoracic spine tumor.   PT order indicated Patient receiving chemotherapy for lung cancer and c/o loss of strength and endurance. -Hx of PE in 2022 -PMHx:  CAD, cardiomyopathy, CABG approx 2007   PAIN: Pt has a tumor on thoracic spine  which causes pain.  He reports reduced pain since radiation.  Pt doesn't lift heavy objects.   Pt has increased pain with sitting for prolonged duration including on an airplane.  Current and best pain:  0/10 ; Worst pain:  5/10 Location:  Thoracic and lumbar  PRECAUTIONS: Other: Stage 4 CA, thoracic spine tumor  WEIGHT BEARING RESTRICTIONS No  FALLS:  Has patient fallen in last 6 months? Yes. Number of falls 2 ; walking up a ramp when it was dark ; standing up from a bent over position  LIVING ENVIRONMENT: Lives with: lives with their spouse Lives in: 2 story Stairs: yes Has following equipment at home: cane, SW  OCCUPATION: pt is retired  PLOF: Independent; Pt was able to play golf, snow ski, and perform daily mobility and activities without SOB and much improved endurance/tolerance.    PATIENT GOALS wants to play golf, snow ski, improve LE strength, improve stamina   OBJECTIVE:   DIAGNOSTIC FINDINGS:   PET scan on 02/11/22:   Degenerative changes in the spine.   IMPRESSION: 1. Decreased FDG avidity within sclerotic lesions in the thoracic spine and medial left iliac wing, compatible with therapy response. 2. Adrenal metastases are stable in  size with associated hypermetabolism, generally similar to prior. 3. No evidence of new metastatic disease. 4. New peribronchovascular ground-glass and consolidation in the peripheral aspect of the right lower lobe is likely infectious/inflammatory in etiology. 5.  Aortic atherosclerosis (ICD10-I70.0).  PATIENT SURVEYS:  FOTO 52 with a goal of 92 at visit #11  COGNITION:  Overall cognitive status: Within functional limits for tasks assessed     VITALS: O2:  98% HR:  67    UE Strength (MMT) Right eval Left eval  Shoulder flexion 5/5 5/5  Hip extension    Shoulder Abduction 5/5 5/5  Elbow flexion 5/5 4+/5  Elbow extension 5/5 5/5  Hip external rotation    Knee flexion    Knee extension    Ankle dorsiflexion    Ankle plantarflexion    Ankle inversion    Ankle eversion     (Blank rows = not tested)  LOWER EXTREMITY MMT:  MMT Right eval Left eval  Hip flexion 5/5; 22.2 HHD 5/5; 19.0 HHD  Hip extension    Hip abduction 4/5; 28.2 HHD seated 4-/5; 31.9 HHD seated  Hip adduction    Hip internal rotation    Hip external rotation 5/5 5/5  Knee flexion    Knee extension 5/5 5/5  Ankle dorsiflexion 5/5 5/5  Ankle plantarflexion WFL, tested in sitting WFL, tested in sitting   Ankle inversion    Ankle eversion     (Blank rows = not tested)   FUNCTIONAL TESTS:  Pt performed STS without UE support. 5x STS:  10 sec without Ue's.   GAIT: Assistive device utilized: None Level of assistance: Complete Independence Comments:   Pt ambulates without limping and has a reciprocal arm swing.  He has decreased foot clearance on L and occasionally scuffs L foot.  Pt ambulated 900 ft in 4.5 mins.  O2:  98% when he first sat down and decreased to 95%.  HR:  84.  HR and O2 both improved as he sat and rested.     TODAY'S TREATMENT: -Reviewed his current workouts with personal trainer.  Pt states that some of the exercises he performs include chest press, leg press, knee  extensions, DB shoulder press, sit to stands, and biceps curls.  -PT answered pt's and wife's questions.  -  See below for pt education   PATIENT EDUCATION:  Education details: POC, objective findings, relevant anatomy, and rationale of exercises.  Person educated: Patient and Spouse Education method: Explanation Education comprehension: verbalized understanding and needs further education   HOME EXERCISE PROGRAM: Will give at a later date  ASSESSMENT:  CLINICAL IMPRESSION: Patient is a 76 y.o. male with a dx of L Non-small cell carcinoma of lung, stage 4 presenting to the clinic with muscle weakness in bilat LE's.  Pt and wife report he has weakness in UE's and LE's.  Pt becomes fatigued and SOB with performing stairs and is limited with ambulation distance due to fatigue and SOB.  Pt enjoys golf but hasn't played in approx 1.5 - 2 years.  Pt's wife states he has difficulty getting off of the floor.  Pt reports he has lost upper and lower body strength.  Pt has been working out with a Physiological scientist for approx 5 weeks.  Pt did well on 5x STS test being below the norms.  Pt has weakness in bilat hip flexors as evidenced by HHD and hip abductors as evidenced by MMT and HHD.  Pt did well ambulating today.  He was fatigued at 4.5 mins of ambulating but his vitals were still good.  Pt should benefit from skilled PT services to address impairments and to improve overall function.      OBJECTIVE IMPAIRMENTS cardiopulmonary status limiting activity, decreased activity tolerance, decreased endurance, decreased mobility, difficulty walking, decreased strength, and pain.   ACTIVITY LIMITATIONS lifting, stairs, and locomotion level  PARTICIPATION LIMITATIONS: community activity and golf  PERSONAL FACTORS Time since onset of injury/illness/exacerbation and 3+ comorbidities: cardiomyopathy, CABG, thoracic spine tumor, and prior hx of PE  are also affecting patient's functional outcome.   REHAB  POTENTIAL: Good  CLINICAL DECISION MAKING: Evolving/moderate complexity  EVALUATION COMPLEXITY: Moderate   GOALS:  SHORT TERM GOALS: Target date: 05/06/2022 Pt will be independent and compliant with HEP for improved strength, function, and tolerance to activity Baseline: Goal status: INITIAL  2.  Pt will report at least a 25% improvement in tolerance with ambulation and perform stairs.   Baseline:  Goal status: INITIAL  3.  Pt will score 0-1 on the lateral step down test on a 6 inch step for improved performance of stairs.  Baseline:  Goal status: INITIAL   LONG TERM GOALS: Target date: 05/21/2022  Pt will be able to perform floor transfers without difficulty.  Baseline:  Goal status: INITIAL  2.  Pt will demo improved bilat hip strength to 5/5 in bilat hip abd and at least a 5# increase in bilat hip flex and abd by HHD for improved performance of and tolerance with functional mobility.  Baseline:  Goal status: INITIAL  3.  Pt will perform a reciprocal gait on the stairs with 1 rail and report he is able to perform stairs without significant fatigue. Baseline:  Goal status: INITIAL  4.  Pt will increase his walking program without adverse effects.  Baseline:  Goal status: INITIAL  5.  Pt will perform 6 MWT next visit and then PT to set goal Baseline:  Goal status: INITIAL     PLAN: PT FREQUENCY: 2x/week  PT DURATION: 6 weeks  PLANNED INTERVENTIONS: Therapeutic exercises, Therapeutic activity, Neuromuscular re-education, Balance training, Gait training, Patient/Family education, Self Care, Stair training, Aquatic Therapy, Manual therapy, and Re-evaluation  PLAN FOR NEXT SESSION:   6 MWT next visit and check vitals.  Pt plans to travel to  [place] next week.  Establish HEP.  Perform supine SLR, S/L vs standing hip abd, lateral band walks, wall push-ups, double leg bridge vs single leg bridge, and heel raises.     Selinda Michaels III PT, DPT 04/09/22 8:11 PM

## 2022-04-09 ENCOUNTER — Encounter: Payer: Self-pay | Admitting: Hematology & Oncology

## 2022-04-09 ENCOUNTER — Other Ambulatory Visit: Payer: Self-pay

## 2022-04-09 ENCOUNTER — Ambulatory Visit (HOSPITAL_BASED_OUTPATIENT_CLINIC_OR_DEPARTMENT_OTHER): Payer: Medicare Other | Attending: Hematology & Oncology | Admitting: Physical Therapy

## 2022-04-09 ENCOUNTER — Encounter (HOSPITAL_BASED_OUTPATIENT_CLINIC_OR_DEPARTMENT_OTHER): Payer: Self-pay | Admitting: Physical Therapy

## 2022-04-09 DIAGNOSIS — M6281 Muscle weakness (generalized): Secondary | ICD-10-CM | POA: Diagnosis not present

## 2022-04-09 DIAGNOSIS — C3492 Malignant neoplasm of unspecified part of left bronchus or lung: Secondary | ICD-10-CM | POA: Insufficient documentation

## 2022-04-13 ENCOUNTER — Inpatient Hospital Stay: Payer: Medicare Other

## 2022-04-13 ENCOUNTER — Other Ambulatory Visit: Payer: Self-pay

## 2022-04-13 ENCOUNTER — Encounter: Payer: Self-pay | Admitting: *Deleted

## 2022-04-13 ENCOUNTER — Inpatient Hospital Stay: Payer: Medicare Other | Attending: Hematology & Oncology

## 2022-04-13 ENCOUNTER — Inpatient Hospital Stay (HOSPITAL_BASED_OUTPATIENT_CLINIC_OR_DEPARTMENT_OTHER): Payer: Medicare Other | Admitting: Hematology & Oncology

## 2022-04-13 ENCOUNTER — Encounter: Payer: Self-pay | Admitting: Hematology & Oncology

## 2022-04-13 VITALS — BP 137/70 | HR 57 | Temp 98.5°F | Resp 16 | Ht 71.0 in | Wt 194.0 lb

## 2022-04-13 DIAGNOSIS — Z79899 Other long term (current) drug therapy: Secondary | ICD-10-CM | POA: Diagnosis not present

## 2022-04-13 DIAGNOSIS — C7801 Secondary malignant neoplasm of right lung: Secondary | ICD-10-CM | POA: Diagnosis not present

## 2022-04-13 DIAGNOSIS — C7951 Secondary malignant neoplasm of bone: Secondary | ICD-10-CM | POA: Insufficient documentation

## 2022-04-13 DIAGNOSIS — C3492 Malignant neoplasm of unspecified part of left bronchus or lung: Secondary | ICD-10-CM

## 2022-04-13 DIAGNOSIS — C3412 Malignant neoplasm of upper lobe, left bronchus or lung: Secondary | ICD-10-CM | POA: Insufficient documentation

## 2022-04-13 DIAGNOSIS — C7802 Secondary malignant neoplasm of left lung: Secondary | ICD-10-CM | POA: Diagnosis not present

## 2022-04-13 DIAGNOSIS — I2699 Other pulmonary embolism without acute cor pulmonale: Secondary | ICD-10-CM | POA: Diagnosis not present

## 2022-04-13 LAB — CMP (CANCER CENTER ONLY)
ALT: 16 U/L (ref 0–44)
AST: 21 U/L (ref 15–41)
Albumin: 4.5 g/dL (ref 3.5–5.0)
Alkaline Phosphatase: 95 U/L (ref 38–126)
Anion gap: 7 (ref 5–15)
BUN: 17 mg/dL (ref 8–23)
CO2: 29 mmol/L (ref 22–32)
Calcium: 9.8 mg/dL (ref 8.9–10.3)
Chloride: 107 mmol/L (ref 98–111)
Creatinine: 1.19 mg/dL (ref 0.61–1.24)
GFR, Estimated: >60 mL/min (ref >60)
Glucose, Bld: 108 mg/dL — ABNORMAL HIGH (ref 70–99)
Potassium: 3.9 mmol/L (ref 3.5–5.1)
Sodium: 143 mmol/L (ref 135–145)
Total Bilirubin: 0.7 mg/dL (ref 0.3–1.2)
Total Protein: 7.3 g/dL (ref 6.5–8.1)

## 2022-04-13 LAB — CBC WITH DIFFERENTIAL (CANCER CENTER ONLY)
Abs Immature Granulocytes: 0.01 10*3/uL (ref 0.00–0.07)
Basophils Absolute: 0 10*3/uL (ref 0.0–0.1)
Basophils Relative: 0 %
Eosinophils Absolute: 0.2 10*3/uL (ref 0.0–0.5)
Eosinophils Relative: 3 %
HCT: 32.9 % — ABNORMAL LOW (ref 39.0–52.0)
Hemoglobin: 10.9 g/dL — ABNORMAL LOW (ref 13.0–17.0)
Immature Granulocytes: 0 %
Lymphocytes Relative: 7 %
Lymphs Abs: 0.3 10*3/uL — ABNORMAL LOW (ref 0.7–4.0)
MCH: 35.6 pg — ABNORMAL HIGH (ref 26.0–34.0)
MCHC: 33.1 g/dL (ref 30.0–36.0)
MCV: 107.5 fL — ABNORMAL HIGH (ref 80.0–100.0)
Monocytes Absolute: 0.6 10*3/uL (ref 0.1–1.0)
Monocytes Relative: 11 %
Neutro Abs: 4 10*3/uL (ref 1.7–7.7)
Neutrophils Relative %: 79 %
Platelet Count: 102 10*3/uL — ABNORMAL LOW (ref 150–400)
RBC: 3.06 MIL/uL — ABNORMAL LOW (ref 4.22–5.81)
RDW: 14.1 % (ref 11.5–15.5)
WBC Count: 5.1 10*3/uL (ref 4.0–10.5)
nRBC: 0 % (ref 0.0–0.2)

## 2022-04-13 LAB — LACTATE DEHYDROGENASE: LDH: 187 U/L (ref 98–192)

## 2022-04-13 MED ORDER — DENOSUMAB 120 MG/1.7ML ~~LOC~~ SOLN
120.0000 mg | Freq: Once | SUBCUTANEOUS | Status: DC
  Filled 2022-04-13: qty 1.7

## 2022-04-13 MED ORDER — METHYLPREDNISOLONE 4 MG PO TBPK: ORAL_TABLET | ORAL | 3 refills | Status: DC

## 2022-04-13 NOTE — Progress Notes (Addendum)
Hematology and Oncology Follow Up Visit  Hershal Eriksson [id number] [birth year] 76 y.o. 04/13/2022   Principle Diagnosis:  Stage IVA (T3N2N0) adenocarcinoma of the LUL -- resected -- (+) KRAS -- progression - metastasis to right lung, bilateral adrenal glands, bony metastasis to T1, Y 10 and left iliac bone PE involving the right lower lobe and right middle lobe branches, no right heart strain noted  Past Therapy: S/P cycle 6 of Carbo/Alimta/Pembrolizumab Alimta/pembrolizumab-maintenance therapy s/p cycle 3/4  - start on 11/28/2020 --Alimta discontinued after cycle 2   Current Therapy:        Lumakras 360 mg po q day -- started on 07/31/2021 -- d/c on 02/07/203 for toxicity Xgeva every 3 months - due again 05/2022 Eliquis 5 mg PO BID Adagrasib  (Krazati)200 mg po BID -- start on 09/22/2021   Interim History:  Mr. Springsteen is here today for follow-up.  He and his wife were going over to [place] tomorrow for the [employer].  So happy that he could go over.  I know he has been looking forward to this.  I do not see any problems with him going over there.  I will give him some prednisone to take while he is over there.  This makes him feel a little bit better.  He has had no problems with the adagrasib.  He has had no cough or shortness of breath.  There is been no nausea or vomiting.  He has had no diarrhea.  He has had no leg swelling.  There has been no rashes.  He has had no headache.  Overall, his performance status is ECOG 1.        Medications:  Allergies as of 04/13/2022       Reactions   Amlodipine Swelling   Swelling of ankles        Medication List        Accurate as of April 13, 2022 10:14 AM. If you have any questions, ask your nurse or doctor.          adagrasib 200 MG tablet Commonly known as: KRAZATI Take 400 mg by mouth 2 (two) times daily.   aspirin 81 MG tablet Take 81 mg by mouth at bedtime.   B-12 PO Take 1,000 mcg by mouth  daily.   cholecalciferol 1000 units tablet Commonly known as: VITAMIN D Take 1,000 Units by mouth at bedtime.   colchicine 0.6 MG tablet Take 1 tablet (0.6 mg total) by mouth every 8 (eight) hours as needed.   diphenoxylate-atropine 2.5-0.025 MG tablet Commonly known as: LOMOTIL TAKE 2 TABLETS BY MOUTH AT ONSET OF DIARRHEA THEN TAKE 1 TABLET AFTER EACH LOOSE STOOL MAX 8 TABLETS PER DAY   Eliquis 5 MG Tabs tablet Generic drug: apixaban TAKE ONE TABLET BY MOUTH TWICE A DAY   Entresto 24-26 MG Generic drug: sacubitril-valsartan Take 1 tablet by mouth 2 (two) times daily.   ezetimibe 10 MG tablet Commonly known as: ZETIA Take 1 tablet (10 mg total) by mouth daily.   folic acid 1 MG tablet Commonly known as: FOLVITE TAKE ONE TABLET BY MOUTH DAILY START 5 TO 7 DAYS BEFORE ALIMTA CHEMO AND CONTINUE UNTIL 21 DAYS AFTER COMPLETED   methylPREDNISolone 4 MG Tbpk tablet Commonly known as: MEDROL DOSEPAK Take as prescribed: 6-5-4-3-2-1   methylPREDNISolone 4 MG Tbpk tablet Commonly known as: MEDROL DOSEPAK Take as directed   metoprolol succinate 25 MG 24 hr tablet Commonly known as: TOPROL-XL Take 1 tablet (25 mg total)  by mouth at bedtime.   nitroGLYCERIN 0.4 MG SL tablet Commonly known as: NITROSTAT Place 1 tablet (0.4 mg total) under the tongue every 5 (five) minutes as needed for chest pain.   oxyCODONE-acetaminophen 7.5-325 MG tablet Commonly known as: Percocet Take 1 tablet by mouth every 4 (four) hours as needed for severe pain.   rosuvastatin 40 MG tablet Commonly known as: CRESTOR Take 1 tablet (40 mg total) by mouth at bedtime.   sertraline 100 MG tablet Commonly known as: ZOLOFT Take 100 mg by mouth 2 (two) times daily.   sucralfate 1 g tablet Commonly known as: Carafate Take 1 tablet (1 g total) by mouth 4 (four) times daily. Dissolve each tablet in 15 cc water before use.   traMADol 50 MG tablet Commonly known as: ULTRAM Take 1 tablet (50 mg total) by  mouth every 6 (six) hours as needed.        Allergies:  Allergies  Allergen Reactions   Amlodipine Swelling    Swelling of ankles    Past Medical History, Surgical history, Social history, and Family History were reviewed and updated.  Review of Systems: Review of Systems  Constitutional: Negative.   HENT: Negative.    Eyes: Negative.   Respiratory: Negative.    Cardiovascular: Negative.   Gastrointestinal: Negative.   Genitourinary: Negative.   Musculoskeletal: Negative.   Skin: Negative.   Neurological: Negative.   Endo/Heme/Allergies: Negative.   Psychiatric/Behavioral: Negative.       Physical Exam:  height is '5\' 11"'  (1.803 m) and weight is 194 lb (88 kg). His oral temperature is 98.5 F (36.9 C). His blood pressure is 137/70 and his pulse is 57 (abnormal). His respiration is 16 and oxygen saturation is 100%.   Wt Readings from Last 3 Encounters:  04/13/22 194 lb (88 kg)  02/23/22 186 lb (84.4 kg)  01/12/22 184 lb (83.5 kg)    Physical Exam Vitals reviewed.  HENT:     Head: Normocephalic and atraumatic.  Eyes:     Pupils: Pupils are equal, round, and reactive to light.  Cardiovascular:     Rate and Rhythm: Normal rate and regular rhythm.     Heart sounds: Normal heart sounds.  Pulmonary:     Effort: Pulmonary effort is normal.     Breath sounds: Normal breath sounds.  Abdominal:     General: Bowel sounds are normal.     Palpations: Abdomen is soft.  Musculoskeletal:        General: No tenderness or deformity. Normal range of motion.     Cervical back: Normal range of motion.  Lymphadenopathy:     Cervical: No cervical adenopathy.  Skin:    General: Skin is warm and dry.     Findings: No erythema or rash.  Neurological:     Mental Status: He is alert and oriented to person, place, and time.  Psychiatric:        Behavior: Behavior normal.        Thought Content: Thought content normal.        Judgment: Judgment normal.     Lab Results   Component Value Date   WBC 5.1 04/13/2022   HGB 10.9 (L) 04/13/2022   HCT 32.9 (L) 04/13/2022   MCV 107.5 (H) 04/13/2022   PLT 102 (L) 04/13/2022   Lab Results  Component Value Date   FERRITIN 758 (H) 10/25/2020   IRON 69 10/25/2020   TIBC 321 10/25/2020   UIBC 252 10/25/2020   IRONPCTSAT  22 10/25/2020   Lab Results  Component Value Date   RBC 3.06 (L) 04/13/2022   No results found for: "KPAFRELGTCHN", "LAMBDASER", "KAPLAMBRATIO" No results found for: "IGGSERUM", "IGA", "IGMSERUM" No results found for: "TOTALPROTELP", "ALBUMINELP", "A1GS", "A2GS", "BETS", "BETA2SER", "GAMS", "MSPIKE", "SPEI"   Chemistry      Component Value Date/Time   NA 143 04/13/2022 0933   NA 142 06/19/2021 1032   K 3.9 04/13/2022 0933   CL 107 04/13/2022 0933   CO2 29 04/13/2022 0933   BUN 17 04/13/2022 0933   BUN 12 06/19/2021 1032   CREATININE 1.19 04/13/2022 0933   CREATININE 1.01 03/26/2016 1009      Component Value Date/Time   CALCIUM 9.8 04/13/2022 0933   ALKPHOS 95 04/13/2022 0933   AST 21 04/13/2022 0933   ALT 16 04/13/2022 0933   BILITOT 0.7 04/13/2022 0933       Impression and Plan: Mr. Cullen is a very pleasant 76 yo caucasian male with an atypical stage IV lung cancer which was previously resected followed by systemic chemotherapy and maintenance pembrolizumab.   When he recurred, we put him on Lumakras.  He could not tolerate this because of elevated liver function studies.  We now have him on adagrasib.  His liver function studies are normal.  He looks fantastic.  Hopefully, his heart is doing okay.  I noticed the drop in his hemoglobin little bit.  We will have to watch this.  I do not think this is going to be a problem for him when he goes over to [place].  When he comes back, we will go ahead and do a PET scan on him.  I will do this in of October.  We will going give him Delton See when he gets back in November.  I will plan to see him back in November.  If all looks  good in November, then we can try to get him through the Holiday season.    [name], MD 9/25/202310:14 AM

## 2022-04-13 NOTE — Patient Instructions (Signed)
Denosumab Injection (Oncology) What is this medication? DENOSUMAB (den oh SUE mab) prevents weakened bones caused by cancer. It may also be used to treat noncancerous bone tumors that cannot be removed by surgery. It can also be used to treat high calcium levels in the blood caused by cancer. It works by blocking a protein that causes bones to break down quickly. This slows down the release of calcium from bones, which lowers calcium levels in your blood. It also makes your bones stronger and less likely to break (fracture). This medicine may be used for other purposes; ask your health care provider or pharmacist if you have questions. COMMON BRAND NAME(S): XGEVA What should I tell my care team before I take this medication? They need to know if you have any of these conditions: Dental disease Having surgery or tooth extraction Infection Kidney disease Low levels of calcium or vitamin D in the blood Malnutrition On hemodialysis Skin conditions or sensitivity Thyroid or parathyroid disease An unusual reaction to denosumab, other medications, foods, dyes, or preservatives Pregnant or trying to get pregnant Breast-feeding How should I use this medication? This medication is for injection under the skin. It is given by your care team in a hospital or clinic setting. A special MedGuide will be given to you before each treatment. Be sure to read this information carefully each time. Talk to your care team about the use of this medication in children. While it may be prescribed for children as young as 13 years for selected conditions, precautions do apply. Overdosage: If you think you have taken too much of this medicine contact a poison control center or emergency room at once. NOTE: This medicine is only for you. Do not share this medicine with others. What if I miss a dose? Keep appointments for follow-up doses. It is important not to miss your dose. Call your care team if you are unable to  keep an appointment. What may interact with this medication? Do not take this medication with any of the following: Other medications containing denosumab This medication may also interact with the following: Medications that lower your chance of fighting infection Steroid medications, such as prednisone or cortisone This list may not describe all possible interactions. Give your health care provider a list of all the medicines, herbs, non-prescription drugs, or dietary supplements you use. Also tell them if you smoke, drink alcohol, or use illegal drugs. Some items may interact with your medicine. What should I watch for while using this medication? Your condition will be monitored carefully while you are receiving this medication. You may need blood work while taking this medication. This medication may increase your risk of getting an infection. Call your care team for advice if you get a fever, chills, sore throat, or other symptoms of a cold or flu. Do not treat yourself. Try to avoid being around people who are sick. You should make sure you get enough calcium and vitamin D while you are taking this medication, unless your care team tells you not to. Discuss the foods you eat and the vitamins you take with your care team. Some people who take this medication have severe bone, joint, or muscle pain. This medication may also increase your risk for jaw problems or a broken thigh bone. Tell your care team right away if you have severe pain in your jaw, bones, joints, or muscles. Tell your care team if you have any pain that does not go away or that gets worse. Talk   to your care team if you may be pregnant. Serious birth defects can occur if you take this medication during pregnancy and for 5 months after the last dose. You will need a negative pregnancy test before starting this medication. Contraception is recommended while taking this medication and for 5 months after the last dose. Your care team  can help you find the option that works for you. What side effects may I notice from receiving this medication? Side effects that you should report to your care team as soon as possible: Allergic reactions--skin rash, itching, hives, swelling of the face, lips, tongue, or throat Bone, joint, or muscle pain Low calcium level--muscle pain or cramps, confusion, tingling, or numbness in the hands or feet Osteonecrosis of the jaw--pain, swelling, or redness in the mouth, numbness of the jaw, poor healing after dental work, unusual discharge from the mouth, visible bones in the mouth Side effects that usually do not require medical attention (report to your care team if they continue or are bothersome): Cough Diarrhea Fatigue Headache Nausea This list may not describe all possible side effects. Call your doctor for medical advice about side effects. You may report side effects to FDA at 1-800-FDA-1088. Where should I keep my medication? This medication is given in a hospital or clinic. It will not be stored at home. NOTE: This sheet is a summary. It may not cover all possible information. If you have questions about this medicine, talk to your doctor, pharmacist, or health care provider.  2023 Elsevier/Gold Standard (2021-11-24 00:00:00)  

## 2022-04-13 NOTE — Progress Notes (Signed)
Patient is going to [place] for vacation. He will need a PET scan upon his return.   Oncology Nurse Navigator Documentation     04/13/2022   10:15 AM  Oncology Nurse Navigator Flowsheets  Navigator Location [facility]  Navigator Encounter Type Follow-up Appt;Appt/Treatment Plan Review  Patient Visit Type MedOnc  Treatment Phase Active Tx  Barriers/Navigation Needs No Barriers At This Time  Acuity Level 1-No Barriers  Support Groups/Services Friends and Family  Time Spent with Patient 15

## 2022-04-13 NOTE — Progress Notes (Signed)
Pt left office after MD appt without getting injection. Called pt lmovm for pt to call office to confirm he will be coming back today or will need to reschedule.

## 2022-04-14 ENCOUNTER — Encounter: Payer: Self-pay | Admitting: *Deleted

## 2022-04-14 NOTE — Progress Notes (Signed)
PET scan scheduled for 05/13/22. Patient is travelling so information regarding his appointment sent to his MyChart, and Radiology Information Sheet also mailed to patient home.   Oncology Nurse Navigator Documentation     04/14/2022    8:15 AM  Oncology Nurse Navigator Flowsheets  Navigator Follow Up Date: 05/13/2022  Navigator Follow Up Reason: Scan Review  Navigator Location [facility]  Navigator Encounter Type MyChart;Appt/Treatment Plan Review  Patient Visit Type MedOnc  Treatment Phase Active Tx  Barriers/Navigation Needs Coordination of Care  Education Other  Interventions Coordination of Care;Education  Acuity Level 1-No Barriers  Coordination of Care Radiology  Education Method Written  Support Groups/Services Friends and Family  Time Spent with Patient 30

## 2022-04-17 ENCOUNTER — Other Ambulatory Visit: Payer: Self-pay

## 2022-04-17 DIAGNOSIS — C3492 Malignant neoplasm of unspecified part of left bronchus or lung: Secondary | ICD-10-CM

## 2022-04-17 MED ORDER — ADAGRASIB 200 MG PO TABS: 400.0000 mg | ORAL_TABLET | Freq: Two times a day (BID) | ORAL | 4 refills | Status: DC

## 2022-04-29 ENCOUNTER — Ambulatory Visit (HOSPITAL_BASED_OUTPATIENT_CLINIC_OR_DEPARTMENT_OTHER): Payer: Medicare Other | Admitting: Physical Therapy

## 2022-05-01 ENCOUNTER — Ambulatory Visit (HOSPITAL_BASED_OUTPATIENT_CLINIC_OR_DEPARTMENT_OTHER): Payer: Medicare Other | Attending: Hematology & Oncology | Admitting: Physical Therapy

## 2022-05-01 ENCOUNTER — Encounter (HOSPITAL_BASED_OUTPATIENT_CLINIC_OR_DEPARTMENT_OTHER): Payer: Self-pay | Admitting: Physical Therapy

## 2022-05-01 DIAGNOSIS — M6281 Muscle weakness (generalized): Secondary | ICD-10-CM | POA: Diagnosis not present

## 2022-05-01 NOTE — Therapy (Signed)
OUTPATIENT PHYSICAL THERAPY TREATMENT NOTE   Patient Name: John Hinton MRN: [id number] DOB:[birth year], 76 y.o., male Today's Date: 05/01/2022   PT End of Session - 05/01/22 1148     Visit Number 2    Number of Visits 12    Date for PT Re-Evaluation 05/21/22    Authorization Type MCR A and B    Progress Note Due on Visit 10    PT Start Time 1145    PT Stop Time 1230    PT Time Calculation (min) 45 min    Activity Tolerance Patient tolerated treatment well    Behavior During Therapy WFL for tasks assessed/performed              Past Medical History:  Diagnosis Date   BPH (benign prostatic hypertrophy)    Bradycardia    a. H/o asymptomatic bradycardia.   Coronary artery disease    a. s/p CABG ~2007 with LIMA to LAD, SVG seq to OM1 and OM2, SVG to PDA and SVG to diag. b. Abnormal nuc 03/2015 - s/p DES to SVG-OM1-OM2. 01/29/17 POBA with cutting balloon to SVG-->OM.  No ischemia Lexi myoview 2021   DCM (dilated cardiomyopathy) ([place]) 09/17/2015   mixed by cMRI 03/2021 with EF 39% with non viable infarct in basal to mid lateral wall and LGE pattern c/w nonischemic component as well. Mild RV dysfunction RVEF 42%.   Dyslipidemia    Dyspnea    r/t lung mass   ED (erectile dysfunction)    Fear of heights    Goals of care, counseling/discussion 06/19/2020   History of radiation therapy    T spine and left ilium 10/07/2021-10/20/2021  Dr Gery Pray   Hypertension    Non-small cell carcinoma of lung, stage 4, left ([place]) 06/19/2020   OSA (obstructive sleep apnea) 07/12/2015   Moderate OSA with AHI 16.5/hr now on CPAP at 8cm H2O, uses cpap nightly      RBBB    Shoulder, capsulitis, adhesive    Left Shoulder   Past Surgical History:  Procedure Laterality Date   APPENDECTOMY     BRONCHIAL BIOPSY  05/09/2020   Procedure: BRONCHIAL BIOPSIES;  Surgeon: [name], DO;  Location: [facility];  Service: Pulmonary;;   BRONCHIAL BRUSHINGS  05/09/2020   Procedure: BRONCHIAL  BRUSHINGS;  Surgeon: [name], DO;  Location: [facility];  Service: Pulmonary;;   BRONCHIAL NEEDLE ASPIRATION BIOPSY  05/09/2020   Procedure: BRONCHIAL NEEDLE ASPIRATION BIOPSIES;  Surgeon: [name], DO;  Location: [facility];  Service: Pulmonary;;   BRONCHIAL WASHINGS  05/09/2020   Procedure: BRONCHIAL WASHINGS;  Surgeon: [name], DO;  Location: [facility];  Service: Pulmonary;;   CARDIAC CATHETERIZATION N/A 04/05/2015   Procedure: Left Heart Cath and Coronary Angiography;  Surgeon: Sherren Mocha, MD;  Location: [facility];  Service: Cardiovascular;  Laterality: N/A;   COLONOSCOPY     CORONARY ARTERY BYPASS GRAFT     w/LIMA to LAD, seq SVG to OM1 and OM2, SVG to PDA and SVG to Diagonal   CORONARY BALLOON ANGIOPLASTY N/A 01/29/2017   Procedure: Coronary Balloon Angioplasty;  Surgeon: Martinique, Peter M, MD;  Location: [facility];  Service: Cardiovascular;  Laterality: N/A;   FIDUCIAL MARKER PLACEMENT  05/09/2020   Procedure: FIDUCIAL MARKER PLACEMENT;  Surgeon: [name], DO;  Location: [facility];  Service: Pulmonary;;   INTERCOSTAL NERVE BLOCK Left 05/22/2020   Procedure: INTERCOSTAL NERVE BLOCK;  Surgeon: Lajuana Matte, MD;  Location:  [facility];  Service: Thoracic;  Laterality: Left;   LEFT HEART CATH AND CORS/GRAFTS ANGIOGRAPHY N/A 01/29/2017   Procedure: Left Heart Cath and Cors/Grafts Angiography;  Surgeon: Martinique, Peter M, MD;  Location: [facility];  Service: Cardiovascular;  Laterality: N/A;   NODE DISSECTION N/A 05/22/2020   Procedure: NODE DISSECTION;  Surgeon: Lajuana Matte, MD;  Location: [facility];  Service: Thoracic;  Laterality: N/A;   VIDEO BRONCHOSCOPY WITH ENDOBRONCHIAL NAVIGATION N/A 05/09/2020   Procedure: VIDEO BRONCHOSCOPY WITH ENDOBRONCHIAL NAVIGATION;  Surgeon: [name], DO;  Location: [facility];  Service: Pulmonary;  Laterality: N/A;   VIDEO BRONCHOSCOPY WITH ENDOBRONCHIAL ULTRASOUND N/A  05/09/2020   Procedure: VIDEO BRONCHOSCOPY WITH ENDOBRONCHIAL ULTRASOUND;  Surgeon: [name], DO;  Location: [facility];  Service: Pulmonary;  Laterality: N/A;   Patient Active Problem List   Diagnosis Date Noted   Malignant neoplasm of lung ([place]) 05/29/2021   Acute pulmonary embolism ([place]) 05/04/2021   Non-small cell carcinoma of lung, stage 4, left ([place]) 06/19/2020   Goals of care, counseling/discussion 06/19/2020   S/P partial lobectomy of lung 05/22/2020   DCM (dilated cardiomyopathy) ([place]) 09/17/2015   OSA (obstructive sleep apnea) 07/12/2015   Bradycardia 08/29/2013   CAD (coronary artery disease), native coronary artery    RBBB    Essential hypertension, benign 08/18/2013   Hyperlipemia 08/18/2013     REFERRING PROVIDER: [name], MD  REFERRING DIAG: C34.92 (ICD-10-CM) - Non-small cell carcinoma of lung, stage 4, left    THERAPY DIAG:  Muscle weakness (generalized)  Rationale for Evaluation and Treatment Rehabilitation  ONSET DATE: PT order 03/04/2022  SUBJECTIVE:   SUBJECTIVE STATEMENT: -Pt was dx'd with Lung CA approx 2 years ago and had a lobectomy.  He has undergone chemo and radiation. Pt currently taking chemo orally.  Pt had a PET scan on 02/11/2022 which showed stable disease per MD note.  MD note also indicated he looks great, feels good, is gaining weight, and has had no cough or shortness of breath.  -Pt becomes fatigued and SOB with performing stairs.  Pt is limited with ambulation distance due to fatigue and SOB.  He has started walking 10-15 mins in neighborhood in the past 1-2 months.  Pt has been working with a Physiological scientist for about 5 weeks once per week.  He states he can some have some minor back pain at the end of a session and is fatigued the following day.    -Pt denies any adverse effects after prior Rx.  Pt just returned from [place] 2 days ago.  He did a lot of walking in [place] and reports he was SOB with walking.  It did  irritate his lumbar some, but his lumbar did not limit him.  He did have fatigue in hip and thigh mm.   PERTINENT HISTORY: -Stage 4 Lung CA and thoracic spine tumor.   PT order indicated Patient receiving chemotherapy for lung cancer and c/o loss of strength and endurance. -Hx of PE in 2022 -PMHx:  CAD, cardiomyopathy, CABG approx 2007   PAIN: Pt has a tumor on thoracic spine which causes pain.  He reports reduced pain since radiation.  Pt doesn't lift heavy objects.   Pt has increased pain with sitting for prolonged duration including on an airplane.  Current and best pain:  0/10 ; Worst pain:  5/10 Location:  Thoracic and lumbar  PRECAUTIONS: Other: Stage 4 CA, thoracic spine tumor  WEIGHT BEARING RESTRICTIONS No  FALLS:  Has patient fallen in last 6 months? Yes. Number of falls 2 ; walking up a ramp when it was dark ; standing up from a bent over position  LIVING ENVIRONMENT: Lives with: lives with their spouse Lives in: 2 story Stairs: yes Has following equipment at home: cane, SW  OCCUPATION: pt is retired  PLOF: Independent; Pt was able to play golf, snow ski, and perform daily mobility and activities without SOB and much improved endurance/tolerance.    PATIENT GOALS wants to play golf, snow ski, improve LE strength, improve stamina   OBJECTIVE:   DIAGNOSTIC FINDINGS:   PET scan on 02/11/22:   Degenerative changes in the spine.   IMPRESSION: 1. Decreased FDG avidity within sclerotic lesions in the thoracic spine and medial left iliac wing, compatible with therapy response. 2. Adrenal metastases are stable in size with associated hypermetabolism, generally similar to prior. 3. No evidence of new metastatic disease. 4. New peribronchovascular ground-glass and consolidation in the peripheral aspect of the right lower lobe is likely infectious/inflammatory in etiology. 5.  Aortic atherosclerosis (ICD10-I70.0).   TODAY'S TREATMENT: Vitals:   HR: 60 O2:  99%  6 MWT: Pt ambulated 1,140 ft.  PT assessed vitals though pulse oximeter would not read vitals.  Pt had no SOB with 6 MWT.   Pt performed: Supine SLR 2x10 reps bilat S/L hip abduction 2x10 reps bilat Supine bridge 2x10 reps Standing hip abd x 10 bilat Wall push ups 2x10 Heel raises 2x10 reps Lateral band walks with GTB above knees x 2 laps    PT established HEP and gave pt a HEP handout.  Educated pt in correct form and appropriate frequency.  Instructed pt he should not have pain with HEP.      PATIENT EDUCATION:  Education details: HEP, POC, relevant anatomy, exercise form, and rationale of exercises.  Person educated: Patient and Spouse Education method: Explanation Education comprehension: verbalized understanding and needs further education   HOME EXERCISE PROGRAM: Access Code: [email or web address] URL: [email or web address] Date: 05/01/2022 Prepared by: Ronny Flurry  Exercises - Supine Active Straight Leg Raise  - 1 x daily - 5-6 x weekly - 2 sets - 10 reps - Supine Bridge  - 1 x daily - 5-6 x weekly - 2 sets - 10 reps - Sidelying Hip Abduction  - 1 x daily - 5-6 x weekly - 2 sets - 10 reps - Wall Push Up  - 1 x daily - 4-5 x weekly - 2 sets - 10 reps - Standing Heel Raise with Support  - 1 x daily - 7 x weekly - 2 sets - 10 reps  ASSESSMENT:  CLINICAL IMPRESSION: Pt reports doing a lot of walking while in [place].  He performed 6 MWT today and demonstrated decreased ambulation distance according to norms.  He had no SOB and ambulated the entire 6 minutes.  PT established HEP and gave pt a HEP handout.  Pt performed exercises well with cuing and instruction in correct form.  Pt responded well to Rx stating he felt good after Rx and had no pain.  He should benefit from skilled PT services to address impairments and to improve overall function.        OBJECTIVE IMPAIRMENTS cardiopulmonary status limiting activity, decreased activity tolerance, decreased endurance,  decreased mobility, difficulty walking, decreased strength, and pain.   ACTIVITY LIMITATIONS lifting, stairs, and locomotion level  PARTICIPATION LIMITATIONS: community activity and golf  PERSONAL FACTORS Time since onset of injury/illness/exacerbation and 3+ comorbidities: cardiomyopathy, CABG, thoracic  spine tumor, and prior hx of PE  are also affecting patient's functional outcome.   REHAB POTENTIAL: Good  CLINICAL DECISION MAKING: Evolving/moderate complexity  EVALUATION COMPLEXITY: Moderate   GOALS:  SHORT TERM GOALS: Target date: 05/06/2022 Pt will be independent and compliant with HEP for improved strength, function, and tolerance to activity Baseline: Goal status: INITIAL  2.  Pt will report at least a 25% improvement in tolerance with ambulation and perform stairs.   Baseline:  Goal status: INITIAL  3.  Pt will score 0-1 on the lateral step down test on a 6 inch step for improved performance of stairs.  Baseline:  Goal status: INITIAL   LONG TERM GOALS: Target date: 05/21/2022  Pt will be able to perform floor transfers without difficulty.  Baseline:  Goal status: INITIAL  2.  Pt will demo improved bilat hip strength to 5/5 in bilat hip abd and at least a 5# increase in bilat hip flex and abd by HHD for improved performance of and tolerance with functional mobility.  Baseline:  Goal status: INITIAL  3.  Pt will perform a reciprocal gait on the stairs with 1 rail and report he is able to perform stairs without significant fatigue. Baseline:  Goal status: INITIAL  4.  Pt will increase his walking program without adverse effects.  Baseline:  Goal status: INITIAL  5.  Pt will perform 6 MWT next visit and then PT to set goal Baseline:  Goal status: INITIAL     PLAN: PT FREQUENCY: 2x/week  PT DURATION: 6 weeks  PLANNED INTERVENTIONS: Therapeutic exercises, Therapeutic activity, Neuromuscular re-education, Balance training, Gait training, Patient/Family  education, Self Care, Stair training, Aquatic Therapy, Manual therapy, and Re-evaluation  PLAN FOR NEXT SESSION:   Review and perform HEP.  Progress strengthening and functional tolerance.  Add Nustep and step exercises next visit.    Selinda Michaels III PT, DPT 05/01/22 12:43 PM

## 2022-05-04 ENCOUNTER — Other Ambulatory Visit (HOSPITAL_COMMUNITY): Payer: Self-pay

## 2022-05-06 ENCOUNTER — Ambulatory Visit (HOSPITAL_BASED_OUTPATIENT_CLINIC_OR_DEPARTMENT_OTHER): Payer: Medicare Other | Admitting: Physical Therapy

## 2022-05-06 ENCOUNTER — Encounter (HOSPITAL_BASED_OUTPATIENT_CLINIC_OR_DEPARTMENT_OTHER): Payer: Self-pay | Admitting: Physical Therapy

## 2022-05-06 DIAGNOSIS — M6281 Muscle weakness (generalized): Secondary | ICD-10-CM | POA: Diagnosis not present

## 2022-05-06 NOTE — Therapy (Signed)
OUTPATIENT PHYSICAL THERAPY TREATMENT NOTE   Patient Name: John Hinton MRN: [id number] DOB:1945/11/17, 76 y.o., male Today's Date: 05/07/2022   PT End of Session - 05/06/22 1159     Visit Number 3    Number of Visits 12    Date for PT Re-Evaluation 05/21/22    Authorization Type MCR A and B    Progress Note Due on Visit 10    PT Start Time 1151    PT Stop Time 1232    PT Time Calculation (min) 41 min    Activity Tolerance Patient tolerated treatment well    Behavior During Therapy WFL for tasks assessed/performed              Past Medical History:  Diagnosis Date   BPH (benign prostatic hypertrophy)    Bradycardia    a. H/o asymptomatic bradycardia.   Coronary artery disease    a. s/p CABG ~2007 with LIMA to LAD, SVG seq to OM1 and OM2, SVG to PDA and SVG to diag. b. Abnormal nuc 03/2015 - s/p DES to SVG-OM1-OM2. 01/29/17 POBA with cutting balloon to SVG-->OM.  No ischemia Lexi myoview 2021   DCM (dilated cardiomyopathy) ([place]) 09/17/2015   mixed by cMRI 03/2021 with EF 39% with non viable infarct in basal to mid lateral wall and LGE pattern c/w nonischemic component as well. Mild RV dysfunction RVEF 42%.   Dyslipidemia    Dyspnea    r/t lung mass   ED (erectile dysfunction)    Fear of heights    Goals of care, counseling/discussion 06/19/2020   History of radiation therapy    T spine and left ilium 10/07/2021-10/20/2021  Dr Gery Pray   Hypertension    Non-small cell carcinoma of lung, stage 4, left ([place]) 06/19/2020   OSA (obstructive sleep apnea) 07/12/2015   Moderate OSA with AHI 16.5/hr now on CPAP at 8cm H2O, uses cpap nightly      RBBB    Shoulder, capsulitis, adhesive    Left Shoulder   Past Surgical History:  Procedure Laterality Date   APPENDECTOMY     BRONCHIAL BIOPSY  05/09/2020   Procedure: BRONCHIAL BIOPSIES;  Surgeon: [name], DO;  Location: [facility];  Service: Pulmonary;;   BRONCHIAL BRUSHINGS  05/09/2020   Procedure: BRONCHIAL  BRUSHINGS;  Surgeon: [name], DO;  Location: [facility];  Service: Pulmonary;;   BRONCHIAL NEEDLE ASPIRATION BIOPSY  05/09/2020   Procedure: BRONCHIAL NEEDLE ASPIRATION BIOPSIES;  Surgeon: [name], DO;  Location: [facility];  Service: Pulmonary;;   BRONCHIAL WASHINGS  05/09/2020   Procedure: BRONCHIAL WASHINGS;  Surgeon: [name], DO;  Location: [facility];  Service: Pulmonary;;   CARDIAC CATHETERIZATION N/A 04/05/2015   Procedure: Left Heart Cath and Coronary Angiography;  Surgeon: Sherren Mocha, MD;  Location: [facility];  Service: Cardiovascular;  Laterality: N/A;   COLONOSCOPY     CORONARY ARTERY BYPASS GRAFT     w/LIMA to LAD, seq SVG to OM1 and OM2, SVG to PDA and SVG to Diagonal   CORONARY BALLOON ANGIOPLASTY N/A 01/29/2017   Procedure: Coronary Balloon Angioplasty;  Surgeon: Martinique, Peter M, MD;  Location: [facility];  Service: Cardiovascular;  Laterality: N/A;   FIDUCIAL MARKER PLACEMENT  05/09/2020   Procedure: FIDUCIAL MARKER PLACEMENT;  Surgeon: [name], DO;  Location: [facility];  Service: Pulmonary;;   INTERCOSTAL NERVE BLOCK Left 05/22/2020   Procedure: INTERCOSTAL NERVE BLOCK;  Surgeon: Lajuana Matte, MD;  [facility];  Service: Thoracic;  Laterality: Left;   LEFT HEART CATH AND CORS/GRAFTS ANGIOGRAPHY N/A 01/29/2017   Procedure: Left Heart Cath and Cors/Grafts Angiography;  Surgeon: Martinique, Peter M, MD;  Location: [facility];  Service: Cardiovascular;  Laterality: N/A;   NODE DISSECTION N/A 05/22/2020   Procedure: NODE DISSECTION;  Surgeon: Lajuana Matte, MD;  Location: [facility];  Service: Thoracic;  Laterality: N/A;   VIDEO BRONCHOSCOPY WITH ENDOBRONCHIAL NAVIGATION N/A 05/09/2020   Procedure: VIDEO BRONCHOSCOPY WITH ENDOBRONCHIAL NAVIGATION;  Surgeon: [name], DO;  Location: [facility];  Service: Pulmonary;  Laterality: N/A;   VIDEO BRONCHOSCOPY WITH ENDOBRONCHIAL ULTRASOUND N/A  05/09/2020   Procedure: VIDEO BRONCHOSCOPY WITH ENDOBRONCHIAL ULTRASOUND;  Surgeon: [name], DO;  Location: [facility];  Service: Pulmonary;  Laterality: N/A;   Patient Active Problem List   Diagnosis Date Noted   Malignant neoplasm of lung ([place]) 05/29/2021   Acute pulmonary embolism ([place]) 05/04/2021   Non-small cell carcinoma of lung, stage 4, left ([place]) 06/19/2020   Goals of care, counseling/discussion 06/19/2020   S/P partial lobectomy of lung 05/22/2020   DCM (dilated cardiomyopathy) ([place]) 09/17/2015   OSA (obstructive sleep apnea) 07/12/2015   Bradycardia 08/29/2013   CAD (coronary artery disease), native coronary artery    RBBB    Essential hypertension, benign 08/18/2013   Hyperlipemia 08/18/2013     REFERRING PROVIDER: [name], MD  REFERRING DIAG: C34.92 (ICD-10-CM) - Non-small cell carcinoma of lung, stage 4, left    THERAPY DIAG:  Muscle weakness (generalized)  Rationale for Evaluation and Treatment Rehabilitation  ONSET DATE: PT order 03/04/2022  SUBJECTIVE:   SUBJECTIVE STATEMENT: -Pt was dx'd with Lung CA approx 2 years ago and had a lobectomy.  He has undergone chemo and radiation. Pt currently taking chemo orally.  Pt had a PET scan on 02/11/2022 which showed stable disease per MD note.  MD note also indicated he looks great, feels good, is gaining weight, and has had no cough or shortness of breath.  -Pt becomes fatigued and SOB with performing stairs.  Pt is limited with ambulation distance due to fatigue and SOB.  Pt denies any adverse effects after prior Rx and did have minimal back pain the following AM.  Pt reports compliance with  HEP.  Pt denies pain currently.   PERTINENT HISTORY: -Stage 4 Lung CA and thoracic spine tumor.   PT order indicated Patient receiving chemotherapy for lung cancer and c/o loss of strength and endurance. -Hx of PE in 2022 -PMHx:  CAD, cardiomyopathy, CABG approx 2007   PAIN: Pt has a tumor on thoracic  spine which causes pain.  He reports reduced pain since radiation.  Pt doesn't lift heavy objects.   Pt has increased pain with sitting for prolonged duration including on an airplane.  Current and best pain:  0/10 ; Worst pain:  5/10 Location:  Thoracic and lumbar  PRECAUTIONS: Other: Stage 4 CA, thoracic spine tumor  WEIGHT BEARING RESTRICTIONS No  FALLS:  Has patient fallen in last 6 months? Yes. Number of falls 2 ; walking up a ramp when it was dark ; standing up from a bent over position  LIVING ENVIRONMENT: Lives with: lives with their spouse Lives in: 2 story Stairs: yes Has following equipment at home: cane, SW  OCCUPATION: pt is retired  PLOF: Independent; Pt was able to play golf, snow ski, and perform daily mobility and activities without SOB and much improved endurance/tolerance.  PATIENT GOALS wants to play golf, snow ski, improve LE strength, improve stamina   OBJECTIVE:   DIAGNOSTIC FINDINGS:   PET scan on 02/11/22:   Degenerative changes in the spine.   IMPRESSION: 1. Decreased FDG avidity within sclerotic lesions in the thoracic spine and medial left iliac wing, compatible with therapy response. 2. Adrenal metastases are stable in size with associated hypermetabolism, generally similar to prior. 3. No evidence of new metastatic disease. 4. New peribronchovascular ground-glass and consolidation in the peripheral aspect of the right lower lobe is likely infectious/inflammatory in etiology. 5.  Aortic atherosclerosis (ICD10-I70.0).   TODAY'S TREATMENT:  Pt performed: Nustep at L4 x 5 mins UE/Les  Supine SLR 2x10 reps bilat S/L hip abduction 2x10 reps bilat Supine bridge 2x10 reps Heel raises 2x10 reps Lateral band walks with GTB above knees x 3 laps  Therapeutic Activities: (for improved functional mobility and performance of stairs) Step ups 2x10 on 6 inch step bilat Lateral step ups on 4 inch step 2x10 bilat     Marching on airex  2x10  Vitals after step ups:   HR: 82-84 O2: 98-99%    PT updated HEP and gave pt a HEP handout.  Educated pt in correct form and appropriate frequency.  Instructed pt he should not have pain with HEP.      PATIENT EDUCATION:  Education details: HEP, POC, relevant anatomy, exercise form, and rationale of exercises.  Person educated: Patient and Spouse Education method: Explanation Education comprehension: verbalized understanding and needs further education   HOME EXERCISE PROGRAM: Access Code: [email or web address] URL: [email or web address] Date: 05/01/2022 Prepared by: Ronny Flurry  Exercises - Supine Active Straight Leg Raise  - 1 x daily - 5-6 x weekly - 2 sets - 10 reps - Supine Bridge  - 1 x daily - 5-6 x weekly - 2 sets - 10 reps - Sidelying Hip Abduction  - 1 x daily - 5-6 x weekly - 2 sets - 10 reps - Wall Push Up  - 1 x daily - 4-5 x weekly - 2 sets - 10 reps - Standing Heel Raise with Support  - 1 x daily - 7 x weekly - 2 sets - 10 reps  Updated HEP: - Side Stepping with Resistance at Thighs  - 1 x daily - 3-4 x weekly - 2-3 sets - 10 reps  ASSESSMENT:  CLINICAL IMPRESSION: Pt gives great effort with all exercises.  He performed exercises well with cuing and instruction in correct form.  Pt did have some fatigue and labored breathing with step exercises.  He responded well to Rx stating he felt fine after Rx and had no pain.  He should benefit from continued skilled PT services to address impairments and to improve overall function.        OBJECTIVE IMPAIRMENTS cardiopulmonary status limiting activity, decreased activity tolerance, decreased endurance, decreased mobility, difficulty walking, decreased strength, and pain.   ACTIVITY LIMITATIONS lifting, stairs, and locomotion level  PARTICIPATION LIMITATIONS: community activity and golf  PERSONAL FACTORS Time since onset of injury/illness/exacerbation and 3+ comorbidities: cardiomyopathy, CABG, thoracic spine  tumor, and prior hx of PE  are also affecting patient's functional outcome.   REHAB POTENTIAL: Good  CLINICAL DECISION MAKING: Evolving/moderate complexity  EVALUATION COMPLEXITY: Moderate   GOALS:  SHORT TERM GOALS: Target date: 05/06/2022 Pt will be independent and compliant with HEP for improved strength, function, and tolerance to activity Baseline: Goal status: INITIAL  2.  Pt will report at least a 25% improvement in  tolerance with ambulation and perform stairs.   Baseline:  Goal status: INITIAL  3.  Pt will score 0-1 on the lateral step down test on a 6 inch step for improved performance of stairs.  Baseline:  Goal status: INITIAL   LONG TERM GOALS: Target date: 05/21/2022  Pt will be able to perform floor transfers without difficulty.  Baseline:  Goal status: INITIAL  2.  Pt will demo improved bilat hip strength to 5/5 in bilat hip abd and at least a 5# increase in bilat hip flex and abd by HHD for improved performance of and tolerance with functional mobility.  Baseline:  Goal status: INITIAL  3.  Pt will perform a reciprocal gait on the stairs with 1 rail and report he is able to perform stairs without significant fatigue. Baseline:  Goal status: INITIAL  4.  Pt will increase his walking program without adverse effects.  Baseline:  Goal status: INITIAL  5.  Pt will ambulate at least 1,290 fton 6 MWT. Baseline:  Goal status: INITIAL     PLAN: PT FREQUENCY: 2x/week  PT DURATION: 6 weeks  PLANNED INTERVENTIONS: Therapeutic exercises, Therapeutic activity, Neuromuscular re-education, Balance training, Gait training, Patient/Family education, Self Care, Stair training, Aquatic Therapy, Manual therapy, and Re-evaluation  PLAN FOR NEXT SESSION:   Review and perform HEP.  Progress strengthening and functional tolerance.     Selinda Michaels III PT, DPT 05/07/22 9:39 AM

## 2022-05-08 ENCOUNTER — Ambulatory Visit (HOSPITAL_BASED_OUTPATIENT_CLINIC_OR_DEPARTMENT_OTHER): Payer: Medicare Other | Admitting: Physical Therapy

## 2022-05-08 ENCOUNTER — Encounter (HOSPITAL_BASED_OUTPATIENT_CLINIC_OR_DEPARTMENT_OTHER): Payer: Self-pay | Admitting: Physical Therapy

## 2022-05-08 DIAGNOSIS — M6281 Muscle weakness (generalized): Secondary | ICD-10-CM | POA: Diagnosis not present

## 2022-05-08 NOTE — Therapy (Addendum)
OUTPATIENT PHYSICAL THERAPY TREATMENT NOTE   Patient Name: John Hinton MRN: [id number] DOB:[birth year], 76 y.o., male Today's Date: 05/08/2022   PT End of Session - 05/08/22 1159     Visit Number 4    Number of Visits 12    Date for PT Re-Evaluation 05/21/22    Authorization Type MCR A and B    PT Start Time 1148    PT Stop Time 1229    PT Time Calculation (min) 41 min    Activity Tolerance Patient tolerated treatment well    Behavior During Therapy WFL for tasks assessed/performed               Past Medical History:  Diagnosis Date   BPH (benign prostatic hypertrophy)    Bradycardia    a. H/o asymptomatic bradycardia.   Coronary artery disease    a. s/p CABG ~2007 with LIMA to LAD, SVG seq to OM1 and OM2, SVG to PDA and SVG to diag. b. Abnormal nuc 03/2015 - s/p DES to SVG-OM1-OM2. 01/29/17 POBA with cutting balloon to SVG-->OM.  No ischemia Lexi myoview 2021   DCM (dilated cardiomyopathy) ([facility]) 09/17/2015   mixed by cMRI 03/2021 with EF 39% with non viable infarct in basal to mid lateral wall and LGE pattern c/w nonischemic component as well. Mild RV dysfunction RVEF 42%.   Dyslipidemia    Dyspnea    r/t lung mass   ED (erectile dysfunction)    Fear of heights    Goals of care, counseling/discussion 06/19/2020   History of radiation therapy    T spine and left ilium 10/07/2021-10/20/2021  Dr Antony Blackbird   Hypertension    Non-small cell carcinoma of lung, stage 4, left ([facility]) 06/19/2020   OSA (obstructive sleep apnea) 07/12/2015   Moderate OSA with AHI 16.5/hr now on CPAP at 8cm H2O, uses cpap nightly      RBBB    Shoulder, capsulitis, adhesive    Left Shoulder   Past Surgical History:  Procedure Laterality Date   APPENDECTOMY     BRONCHIAL BIOPSY  05/09/2020   Procedure: BRONCHIAL BIOPSIES;  Surgeon: Josephine Igo, DO;  [facility]: [facility];  Service: Pulmonary;;   BRONCHIAL BRUSHINGS  05/09/2020   Procedure: BRONCHIAL BRUSHINGS;  Surgeon: Josephine Igo, DO;  [facility];  Service: Pulmonary;;   BRONCHIAL NEEDLE ASPIRATION BIOPSY  05/09/2020   Procedure: BRONCHIAL NEEDLE ASPIRATION BIOPSIES;  Surgeon: Josephine Igo, DO;  [facility];  Service: Pulmonary;;   BRONCHIAL WASHINGS  05/09/2020   Procedure: BRONCHIAL WASHINGS;  Surgeon: Josephine Igo, DO;  [facility];  Service: Pulmonary;;   CARDIAC CATHETERIZATION N/A 04/05/2015   Procedure: Left Heart Cath and Coronary Angiography;  Surgeon: Tonny Bollman, MD;  Location: [facility];  Service: Cardiovascular;  Laterality: N/A;   COLONOSCOPY     CORONARY ARTERY BYPASS GRAFT     w/LIMA to LAD, seq SVG to OM1 and OM2, SVG to PDA and SVG to Diagonal   CORONARY BALLOON ANGIOPLASTY N/A 01/29/2017   Procedure: Coronary Balloon Angioplasty;  Surgeon: Swazil[place], Peter M, MD;  Location: [facility];  Service: Cardiovascular;  Laterality: N/A;   FIDUCIAL MARKER PLACEMENT  05/09/2020   Procedure: FIDUCIAL MARKER PLACEMENT;  Surgeon: Josephine Igo, DO;  [facility];  Service: Pulmonary;;   INTERCOSTAL NERVE BLOCK Left 05/22/2020   Procedure: INTERCOSTAL NERVE BLOCK;  Surgeon: Corliss Skains, MD;  [facility];  Service: Thoracic;  Laterality: Left;  LEFT HEART CATH AND CORS/GRAFTS ANGIOGRAPHY N/A 01/29/2017   Procedure: Left Heart Cath and Cors/Grafts Angiography;  Surgeon: Swazil[place], Peter M, MD;  [facility]: [facility];  Service: Cardiovascular;  Laterality: N/A;   NODE DISSECTION N/A 05/22/2020   Procedure: NODE DISSECTION;  Surgeon: Corliss Skains, MD;  [facility];  Service: Thoracic;  Laterality: N/A;   VIDEO BRONCHOSCOPY WITH ENDOBRONCHIAL NAVIGATION N/A 05/09/2020   Procedure: VIDEO BRONCHOSCOPY WITH ENDOBRONCHIAL NAVIGATION;  Surgeon: Josephine Igo, DO;  [facility];  Service: Pulmonary;  Laterality: N/A;   VIDEO BRONCHOSCOPY WITH ENDOBRONCHIAL ULTRASOUND N/A 05/09/2020   Procedure: VIDEO  BRONCHOSCOPY WITH ENDOBRONCHIAL ULTRASOUND;  Surgeon: Josephine Igo, DO;  [facility];  Service: Pulmonary;  Laterality: N/A;   Patient Active Problem List   Diagnosis Date Noted   Malignant neoplasm of lung ([facility]) 05/29/2021   Acute pulmonary embolism ([facility]) 05/04/2021   Non-small cell carcinoma of lung, stage 4, left ([facility]) 06/19/2020   Goals of care, counseling/discussion 06/19/2020   S/P partial lobectomy of lung 05/22/2020   DCM (dilated cardiomyopathy) ([facility]) 09/17/2015   OSA (obstructive sleep apnea) 07/12/2015   Bradycardia 08/29/2013   CAD (coronary artery disease), native coronary artery    RBBB    Essential hypertension, benign 08/18/2013   Hyperlipemia 08/18/2013     REFERRING PROVIDER: Josph Macho, MD  REFERRING DIAG: C34.92 (ICD-10-CM) - Non-small cell carcinoma of lung, stage 4, left    THERAPY DIAG:  Muscle weakness (generalized)  Rationale for Evaluation and Treatment Rehabilitation  ONSET DATE: PT order 03/04/2022  SUBJECTIVE:   SUBJECTIVE STATEMENT: -Pt was dx'd with Lung CA approx 2 years ago and had a lobectomy.  He has undergone chemo and radiation. Pt currently taking chemo orally.  Pt had a PET scan on 02/11/2022 which showed stable disease per MD note.  MD note also indicated he looks great, feels good, is gaining weight, and has had no cough or shortness of breath.  -Pt reports he was sore all over the following day.  He states he didn't have any increased pain after Rx.  Pt reports he is more tired today and yesterday.  He did wake up with some pain this AM.  Pt becomes fatigued and SOB with performing stairs.  Pt is limited with ambulation distance due to fatigue and SOB.  Pt reports compliance with  HEP.       PERTINENT HISTORY: -Stage 4 Lung CA and thoracic spine tumor.   PT order indicated Patient receiving chemotherapy for lung cancer and c/o loss of strength and endurance. -Hx of PE in 2022 -PMHx:  CAD, cardiomyopathy, CABG  approx 2007   PAIN: Pt has a tumor on thoracic spine which causes pain.  He reports reduced pain since radiation.  Pt doesn't lift heavy objects.   Pt has increased pain with sitting for prolonged duration including on an airplane.  Current:  3-4/10 ; best pain:  0/10 ; Worst pain:  5/10 Location:  lumbar  PRECAUTIONS: Other: Stage 4 CA, thoracic spine tumor  WEIGHT BEARING RESTRICTIONS No  FALLS:  Has patient fallen in last 6 months? Yes. Number of falls 2 ; walking up a ramp when it was dark ; standing up from a bent over position  LIVING ENVIRONMENT: Lives with: lives with their spouse Lives in: 2 story Stairs: yes Has following equipment at home: cane, SW  OCCUPATION: pt is retired  PLOF: Independent; Pt was able to play golf, snow ski, and perform  daily mobility and activities without SOB and much improved endurance/tolerance.    PATIENT GOALS wants to play golf, snow ski, improve LE strength, improve stamina   OBJECTIVE:   DIAGNOSTIC FINDINGS:   PET scan on 02/11/22:   Degenerative changes in the spine.   IMPRESSION: 1. Decreased FDG avidity within sclerotic lesions in the thoracic spine and medial left iliac wing, compatible with therapy response. 2. Adrenal metastases are stable in size with associated hypermetabolism, generally similar to prior. 3. No evidence of new metastatic disease. 4. New peribronchovascular ground-glass and consolidation in the peripheral aspect of the right lower lobe is likely infectious/inflammatory in etiology. 5.  Aortic atherosclerosis (ICD10-I70.0).   TODAY'S TREATMENT:  Pt performed: Nustep at L4 x 5 mins UE/Les  Supine SLR 2x10 reps bilat S/L hip abduction 2x10 reps bilat Supine bridge 2x10 reps Heel raises 2x10 reps Lateral band walks with GTB above knees x 4 laps  Therapeutic Activities: (for improved functional mobility and performance of stairs) Step ups 2x10 on 6 inch step bilat Lateral step ups on 4 inch step  2x10 bilat     Marching on airex 2x10     Heel taps on 4 inch step x 10 bilat    PATIENT EDUCATION:  Education details: HEP, POC, relevant anatomy, exercise form, and rationale of exercises.  Person educated: Patient and Spouse Education method: Explanation Education comprehension: verbalized understanding and needs further education   HOME EXERCISE PROGRAM: Access Code: [email or web address] URL: [email or web address] Date: 05/01/2022 Prepared by: Aaron Edelman  Exercises - Supine Active Straight Leg Raise  - 1 x daily - 5-6 x weekly - 2 sets - 10 reps - Supine Bridge  - 1 x daily - 5-6 x weekly - 2 sets - 10 reps - Sidelying Hip Abduction  - 1 x daily - 5-6 x weekly - 2 sets - 10 reps - Wall Push Up  - 1 x daily - 4-5 x weekly - 2 sets - 10 reps - Standing Heel Raise with Support  - 1 x daily - 7 x weekly - 2 sets - 10 reps  Updated HEP: - Side Stepping with Resistance at Thighs  - 1 x daily - 3-4 x weekly - 2-3 sets - 10 reps  ASSESSMENT:  CLINICAL IMPRESSION: Pt performed exercises well with cuing and instruction in correct form.  PT added heel taps and pt required increased cuing for correct form.  Pt was fatigued with Rx though states he felt better exercising.  Pt gives great effort with all exercises.   He responded well to Rx stating his back felt better after Rx.  He should benefit from continued skilled PT services to address impairments and to improve overall function.        OBJECTIVE IMPAIRMENTS cardiopulmonary status limiting activity, decreased activity tolerance, decreased endurance, decreased mobility, difficulty walking, decreased strength, and pain.   ACTIVITY LIMITATIONS lifting, stairs, and locomotion level  PARTICIPATION LIMITATIONS: community activity and golf  PERSONAL FACTORS Time since onset of injury/illness/exacerbation and 3+ comorbidities: cardiomyopathy, CABG, thoracic spine tumor, and prior hx of PE  are also affecting patient's functional  outcome.   REHAB POTENTIAL: Good  CLINICAL DECISION MAKING: Evolving/moderate complexity  EVALUATION COMPLEXITY: Moderate   GOALS:  SHORT TERM GOALS: Target date: 05/06/2022 Pt will be independent and compliant with HEP for improved strength, function, and tolerance to activity Baseline: Goal status: INITIAL  2.  Pt will report at least a 25% improvement in tolerance with ambulation and perform stairs.  Baseline:  Goal status: INITIAL  3.  Pt will score 0-1 on the lateral step down test on a 6 inch step for improved performance of stairs.  Baseline:  Goal status: INITIAL   LONG TERM GOALS: Target date: 05/21/2022  Pt will be able to perform floor transfers without difficulty.  Baseline:  Goal status: INITIAL  2.  Pt will demo improved bilat hip strength to 5/5 in bilat hip abd and at least a 5# increase in bilat hip flex and abd by HHD for improved performance of and tolerance with functional mobility.  Baseline:  Goal status: INITIAL  3.  Pt will perform a reciprocal gait on the stairs with 1 rail and report he is able to perform stairs without significant fatigue. Baseline:  Goal status: INITIAL  4.  Pt will increase his walking program without adverse effects.  Baseline:  Goal status: INITIAL  5.  Pt will ambulate at least 1,290 fton 6 MWT. Baseline:  Goal status: INITIAL     PLAN: PT FREQUENCY: 2x/week  PT DURATION: 6 weeks  PLANNED INTERVENTIONS: Therapeutic exercises, Therapeutic activity, Neuromuscular re-education, Balance training, Gait training, Patient/Family education, Self Care, Stair training, Aquatic Therapy, Manual therapy, and Re-evaluation  PLAN FOR NEXT SESSION:   Review and perform HEP.  Progress strengthening and functional tolerance.     Audie Clear III PT, DPT 05/08/22 5:13 PM  PHYSICAL THERAPY DISCHARGE SUMMARY  Visits from Start of Care: 4  Current functional level related to goals / functional outcomes: Unable to assess  current functional status or goals due to pt not being present at discharge.    Remaining deficits: Unable to assess due to pt not being present at discharge.    Education / Equipment: Pt has a HEP.   Patient was seen in PT from 04/09/22 - 05/08/22.  Pt had a change in medical status and his wife sent a message to PT stating he had been admitted into the hospital.  Patient is being discharged due to a change in medical status.    Audie Clear III PT, DPT 09/04/22 9:53 AM

## 2022-05-13 ENCOUNTER — Encounter (HOSPITAL_BASED_OUTPATIENT_CLINIC_OR_DEPARTMENT_OTHER): Payer: Medicare Other | Admitting: Physical Therapy

## 2022-05-13 ENCOUNTER — Ambulatory Visit (HOSPITAL_COMMUNITY)
Admission: RE | Admit: 2022-05-13 | Discharge: 2022-05-13 | Disposition: A | Discharge status: Home / Self Care | Payer: Medicare Other | Source: Ambulatory Visit | Attending: Hematology & Oncology | Admitting: Hematology & Oncology

## 2022-05-13 ENCOUNTER — Encounter: Payer: Self-pay | Admitting: *Deleted

## 2022-05-13 DIAGNOSIS — C3492 Malignant neoplasm of unspecified part of left bronchus or lung: Secondary | ICD-10-CM | POA: Diagnosis not present

## 2022-05-13 DIAGNOSIS — J984 Other disorders of lung: Secondary | ICD-10-CM | POA: Diagnosis not present

## 2022-05-13 DIAGNOSIS — C7492 Malignant neoplasm of unspecified part of left adrenal gland: Secondary | ICD-10-CM | POA: Diagnosis not present

## 2022-05-13 DIAGNOSIS — R051 Acute cough: Secondary | ICD-10-CM

## 2022-05-13 DIAGNOSIS — C7951 Secondary malignant neoplasm of bone: Secondary | ICD-10-CM | POA: Insufficient documentation

## 2022-05-13 DIAGNOSIS — C7972 Secondary malignant neoplasm of left adrenal gland: Secondary | ICD-10-CM | POA: Diagnosis not present

## 2022-05-13 LAB — GLUCOSE, CAPILLARY: Glucose-Capillary: 116 mg/dL — ABNORMAL HIGH (ref 70–99)

## 2022-05-13 NOTE — Progress Notes (Signed)
Please see MyChart communication dated 05/13/2022. Orders placed. Appointment made.  Oncology Nurse Navigator Documentation     05/13/2022    1:15 PM  Oncology Nurse Navigator Flowsheets  Navigator Follow Up Date: 05/14/2022  Navigator Follow Up Reason: Scan Review;Test Results  Navigator Location [facility]  Navigator Encounter Type MyChart  Patient Visit Type MedOnc  Treatment Phase Active Tx  Barriers/Navigation Needs Coordination of Care  Education Other  Interventions Coordination of Care;Education  Acuity Level 1-No Barriers  Coordination of Care Radiology;Appts  Education Method Written  Support Groups/Services Friends and Family  Time Spent with Patient 47

## 2022-05-14 ENCOUNTER — Inpatient Hospital Stay: Payer: Medicare Other | Attending: Hematology & Oncology

## 2022-05-14 ENCOUNTER — Ambulatory Visit (HOSPITAL_BASED_OUTPATIENT_CLINIC_OR_DEPARTMENT_OTHER)
Admission: RE | Admit: 2022-05-14 | Discharge: 2022-05-14 | Disposition: A | Discharge status: Home / Self Care | Payer: Medicare Other | Source: Ambulatory Visit | Attending: Hematology & Oncology | Admitting: Hematology & Oncology

## 2022-05-14 DIAGNOSIS — R051 Acute cough: Secondary | ICD-10-CM | POA: Insufficient documentation

## 2022-05-14 DIAGNOSIS — R059 Cough, unspecified: Secondary | ICD-10-CM | POA: Diagnosis not present

## 2022-05-14 DIAGNOSIS — C3412 Malignant neoplasm of upper lobe, left bronchus or lung: Secondary | ICD-10-CM | POA: Diagnosis not present

## 2022-05-14 LAB — CMP (CANCER CENTER ONLY)
ALT: 34 U/L (ref 0–44)
AST: 35 U/L (ref 15–41)
Albumin: 4.2 g/dL (ref 3.5–5.0)
Alkaline Phosphatase: 78 U/L (ref 38–126)
Anion gap: 9 (ref 5–15)
BUN: 15 mg/dL (ref 8–23)
CO2: 25 mmol/L (ref 22–32)
Calcium: 9.6 mg/dL (ref 8.9–10.3)
Chloride: 107 mmol/L (ref 98–111)
Creatinine: 1.11 mg/dL (ref 0.61–1.24)
GFR, Estimated: >60 mL/min (ref >60)
Glucose, Bld: 116 mg/dL — ABNORMAL HIGH (ref 70–99)
Potassium: 4 mmol/L (ref 3.5–5.1)
Sodium: 141 mmol/L (ref 135–145)
Total Bilirubin: 0.6 mg/dL (ref 0.3–1.2)
Total Protein: 7 g/dL (ref 6.5–8.1)

## 2022-05-14 LAB — CBC WITH DIFFERENTIAL (CANCER CENTER ONLY)
Abs Immature Granulocytes: 0.02 10*3/uL (ref 0.00–0.07)
Basophils Absolute: 0 10*3/uL (ref 0.0–0.1)
Basophils Relative: 0 %
Eosinophils Absolute: 0.1 10*3/uL (ref 0.0–0.5)
Eosinophils Relative: 2 %
HCT: 34 % — ABNORMAL LOW (ref 39.0–52.0)
Hemoglobin: 11.4 g/dL — ABNORMAL LOW (ref 13.0–17.0)
Immature Granulocytes: 1 %
Lymphocytes Relative: 5 %
Lymphs Abs: 0.2 10*3/uL — ABNORMAL LOW (ref 0.7–4.0)
MCH: 35.3 pg — ABNORMAL HIGH (ref 26.0–34.0)
MCHC: 33.5 g/dL (ref 30.0–36.0)
MCV: 105.3 fL — ABNORMAL HIGH (ref 80.0–100.0)
Monocytes Absolute: 0.3 10*3/uL (ref 0.1–1.0)
Monocytes Relative: 7 %
Neutro Abs: 3.6 10*3/uL (ref 1.7–7.7)
Neutrophils Relative %: 85 %
Platelet Count: 132 10*3/uL — ABNORMAL LOW (ref 150–400)
RBC: 3.23 MIL/uL — ABNORMAL LOW (ref 4.22–5.81)
RDW: 13.4 % (ref 11.5–15.5)
WBC Count: 4.2 10*3/uL (ref 4.0–10.5)
nRBC: 0 % (ref 0.0–0.2)

## 2022-05-14 LAB — SAMPLE TO BLOOD BANK

## 2022-05-15 ENCOUNTER — Ambulatory Visit (HOSPITAL_BASED_OUTPATIENT_CLINIC_OR_DEPARTMENT_OTHER): Payer: Medicare Other | Admitting: Physical Therapy

## 2022-05-15 ENCOUNTER — Encounter: Payer: Self-pay | Admitting: Hematology & Oncology

## 2022-05-15 ENCOUNTER — Other Ambulatory Visit: Payer: Self-pay | Admitting: Family

## 2022-05-15 ENCOUNTER — Encounter: Payer: Self-pay | Admitting: *Deleted

## 2022-05-15 ENCOUNTER — Encounter (HOSPITAL_BASED_OUTPATIENT_CLINIC_OR_DEPARTMENT_OTHER): Payer: Self-pay

## 2022-05-15 DIAGNOSIS — C3492 Malignant neoplasm of unspecified part of left bronchus or lung: Secondary | ICD-10-CM

## 2022-05-15 DIAGNOSIS — R059 Cough, unspecified: Secondary | ICD-10-CM

## 2022-05-15 MED ORDER — BENZONATATE 200 MG PO CAPS: 200.0000 mg | ORAL_CAPSULE | Freq: Three times a day (TID) | ORAL | 1 refills | Status: DC | PRN

## 2022-05-15 MED ORDER — CEFDINIR 300 MG PO CAPS: 600.0000 mg | ORAL_CAPSULE | Freq: Every day | ORAL | 0 refills | Status: DC

## 2022-05-15 NOTE — Progress Notes (Unsigned)
Suttle, John Ashing, MD  Donita Brooks D Approved for CT guided left pleural biopsy.   Dylan

## 2022-05-15 NOTE — Progress Notes (Signed)
Reviewed PET results with Lottie Dawson NP. She will send message to Dr Marin Olp regarding finding.   Patient's wife called after seeing PET results. She states they are very anxious about findings. She also states that patient cannot stop coughing and he needs something for this. Explained to her that Dr Marin Olp was not in the office, but that we had a message out and would call when we had a plan.   Received message from Dr Marin Olp: Unfortunately I suspect cancer recurred.  We need a biopsy of pleura to confirm.  I do not see a problem with antibiotics.  I like Omnicef 600 mg po q day for 7 days.  Thanks!!    Judson Roch will place order for biopsy, antibiotic and tessalon.   Called wife. She is aware of new prescriptions and order for biopsy. Instructed her to take the patient to the ED for assessment and treatment.   Oncology Nurse Navigator Documentation     05/15/2022    3:00 PM  Oncology Nurse Navigator Flowsheets  Navigator Follow Up Date: 05/19/2022  Navigator Follow Up Reason: Appointment Review  Navigator Location [facility]  Navigator Encounter Type Telephone  Telephone Diagnostic Results;Outgoing Call;Medication Assistance  Patient Visit Type MedOnc  Treatment Phase Active Tx  Barriers/Navigation Needs Coordination of Care  Education Other  Interventions Education;Medication Assistance;Psycho-Social Support  Acuity Level 1-No Barriers  Education Method Verbal  Support Groups/Services Friends and Family  Time Spent with Patient 30

## 2022-05-18 ENCOUNTER — Encounter (HOSPITAL_BASED_OUTPATIENT_CLINIC_OR_DEPARTMENT_OTHER): Payer: Self-pay | Admitting: Physical Therapy

## 2022-05-18 NOTE — Progress Notes (Unsigned)
Suttle, Rosanne Ashing, MD  Donita Brooks D Approved for CT guided left pleural biopsy.   John Hinton

## 2022-05-19 ENCOUNTER — Other Ambulatory Visit: Payer: Self-pay | Admitting: *Deleted

## 2022-05-19 ENCOUNTER — Encounter: Payer: Self-pay | Admitting: *Deleted

## 2022-05-19 MED ORDER — DIPHENOXYLATE-ATROPINE 2.5-0.025 MG PO TABS: ORAL_TABLET | ORAL | 0 refills | Status: DC

## 2022-05-19 NOTE — Progress Notes (Signed)
Received call from patient's wife. They have still not yet been scheduled for a biopsy. Reached out to scheduling, and they have received the approval, but are waiting to hear back from CT about open time slots. Patient's wife, John Hinton is updated.   Patient scheduled for biopsy on 05/22/2022. Called wife and she is aware of appointment.   Oncology Nurse Navigator Documentation     05/19/2022    3:00 PM  Oncology Nurse Navigator Flowsheets  Navigator Follow Up Date: 05/22/2022  Navigator Follow Up Reason: Other:  Navigator Location [facility]  Navigator Encounter Type Appt/Treatment Plan Review  Telephone Appt Confirmation/Clarification  Patient Visit Type MedOnc  Treatment Phase Active Tx  Barriers/Navigation Needs Coordination of Care  Interventions Other  Acuity Level 1-No Barriers  Coordination of Care Radiology  Support Groups/Services Friends and Family  Time Spent with Patient 15

## 2022-05-20 ENCOUNTER — Encounter (HOSPITAL_BASED_OUTPATIENT_CLINIC_OR_DEPARTMENT_OTHER): Payer: Self-pay

## 2022-05-20 ENCOUNTER — Encounter (HOSPITAL_BASED_OUTPATIENT_CLINIC_OR_DEPARTMENT_OTHER): Payer: Medicare Other | Admitting: Physical Therapy

## 2022-05-20 NOTE — Progress Notes (Unsigned)
   RE: [id number] Received: Blinda Leatherwood, MD  Donita Brooks D Just hold for 2 days. Laurey Arrow        Previous Messages    ----- Message -----  From: Donita Brooks D  Sent: 05/19/2022   2:20 PM EDT  To: San Morelle, RN; [name], MD  Subject: Lollie Sails morning!  Please advise how long patient needs to HOLD Eloquis for BX.  I am able to get him in on Friday 11/3 @ 9am.  I have left a voicemail for him to call me back   Thanks  [place]

## 2022-05-21 ENCOUNTER — Other Ambulatory Visit (HOSPITAL_COMMUNITY): Payer: Self-pay | Admitting: Physician Assistant

## 2022-05-21 ENCOUNTER — Other Ambulatory Visit: Payer: Self-pay | Admitting: Radiology

## 2022-05-21 DIAGNOSIS — J948 Other specified pleural conditions: Secondary | ICD-10-CM

## 2022-05-21 NOTE — H&P (Signed)
Chief Complaint: Restaging for SCLC.  Found to have hypermetabolic left lung base. Request is for left lung base biopsy  Referring Physician(s): Celso Amy  Supervising Physician: Sandi Mariscal  Patient Status: [facility] - Out-pt  History of Present Illness: John Hinton is a 76 y.o. male outpatient. History of HTN, HLD, CAD, SCLC of the left lung. Found to have hypermetabolic thickened rim of the left lung base. Team is requesting lung biopsy for further evaluation.  Currently without any significant complaints. Patient alert and laying in bed,calm. Denies any fevers, headache, chest pain, SOB, cough, abdominal pain, nausea, vomiting or bleeding. Return precautions and treatment recommendations and follow-up discussed with the patient  who is agreeable with the plan.    Past Medical History:  Diagnosis Date   BPH (benign prostatic hypertrophy)    Bradycardia    a. H/o asymptomatic bradycardia.   Coronary artery disease    a. s/p CABG ~2007 with LIMA to LAD, SVG seq to OM1 and OM2, SVG to PDA and SVG to diag. b. Abnormal nuc 03/2015 - s/p DES to SVG-OM1-OM2. 01/29/17 POBA with cutting balloon to SVG-->OM.  No ischemia Lexi myoview 2021   DCM (dilated cardiomyopathy) ([place]) 09/17/2015   mixed by cMRI 03/2021 with EF 39% with non viable infarct in basal to mid lateral wall and LGE pattern c/w nonischemic component as well. Mild RV dysfunction RVEF 42%.   Dyslipidemia    Dyspnea    r/t lung mass   ED (erectile dysfunction)    Fear of heights    Goals of care, counseling/discussion 06/19/2020   History of radiation therapy    T spine and left ilium 10/07/2021-10/20/2021  Dr Gery Pray   Hypertension    Non-small cell carcinoma of lung, stage 4, left ([place]) 06/19/2020   OSA (obstructive sleep apnea) 07/12/2015   Moderate OSA with AHI 16.5/hr now on CPAP at 8cm H2O, uses cpap nightly      RBBB    Shoulder, capsulitis, adhesive    Left Shoulder    Past Surgical History:   Procedure Laterality Date   APPENDECTOMY     BRONCHIAL BIOPSY  05/09/2020   Procedure: BRONCHIAL BIOPSIES;  Surgeon: [name], DO;  Location: [place] [facility];  Service: Pulmonary;;   BRONCHIAL BRUSHINGS  05/09/2020   Procedure: BRONCHIAL BRUSHINGS;  Surgeon: [name], DO;  Location: [place] [facility];  Service: Pulmonary;;   BRONCHIAL NEEDLE ASPIRATION BIOPSY  05/09/2020   Procedure: BRONCHIAL NEEDLE ASPIRATION BIOPSIES;  Surgeon: [name], DO;  Location: [place] [facility];  Service: Pulmonary;;   BRONCHIAL WASHINGS  05/09/2020   Procedure: BRONCHIAL WASHINGS;  Surgeon: [name], DO;  Location: [place] [facility];  Service: Pulmonary;;   CARDIAC CATHETERIZATION N/A 04/05/2015   Procedure: Left Heart Cath and Coronary Angiography;  Surgeon: Sherren Mocha, MD;  Location: [place] [facility];  Service: Cardiovascular;  Laterality: N/A;   COLONOSCOPY     CORONARY ARTERY BYPASS GRAFT     w/LIMA to LAD, seq SVG to OM1 and OM2, SVG to PDA and SVG to Diagonal   CORONARY BALLOON ANGIOPLASTY N/A 01/29/2017   Procedure: Coronary Balloon Angioplasty;  Surgeon: Martinique, Peter M, MD;  Location: [place] [facility];  Service: Cardiovascular;  Laterality: N/A;   FIDUCIAL MARKER PLACEMENT  05/09/2020   Procedure: FIDUCIAL MARKER PLACEMENT;  Surgeon: [name], DO;  Location: [place] [facility];  Service: Pulmonary;;   INTERCOSTAL NERVE BLOCK Left 05/22/2020   Procedure: INTERCOSTAL NERVE BLOCK;  Surgeon: Lajuana Matte,  MD;  Location: [facility];  Service: Thoracic;  Laterality: Left;   LEFT HEART CATH AND CORS/GRAFTS ANGIOGRAPHY N/A 01/29/2017   Procedure: Left Heart Cath and Cors/Grafts Angiography;  Surgeon: Martinique, Peter M, MD;  Location: [place] [facility];  Service: Cardiovascular;  Laterality: N/A;   NODE DISSECTION N/A 05/22/2020   Procedure: NODE DISSECTION;  Surgeon: Lajuana Matte, MD;  Location: [place];  Service: Thoracic;  Laterality: N/A;   VIDEO BRONCHOSCOPY WITH ENDOBRONCHIAL  NAVIGATION N/A 05/09/2020   Procedure: VIDEO BRONCHOSCOPY WITH ENDOBRONCHIAL NAVIGATION;  Surgeon: [name], DO;  Location: [place];  Service: Pulmonary;  Laterality: N/A;   VIDEO BRONCHOSCOPY WITH ENDOBRONCHIAL ULTRASOUND N/A 05/09/2020   Procedure: VIDEO BRONCHOSCOPY WITH ENDOBRONCHIAL ULTRASOUND;  Surgeon: [name], DO;  Location: [place];  Service: Pulmonary;  Laterality: N/A;    Allergies: Amlodipine  Medications: Prior to Admission medications   Medication Sig Start Date End Date Taking? Authorizing Provider  adagrasib (KRAZATI) 200 MG tablet Take 2 tablets (400 mg total) by mouth 2 (two) times daily. 04/17/22  Yes [name], MD  aspirin 81 MG tablet Take 81 mg by mouth at bedtime.    Yes [provider]  benzonatate (TESSALON) 200 MG capsule Take 1 capsule (200 mg total) by mouth 3 (three) times daily as needed for cough. 05/15/22  Yes Celso Amy, NP  cefdinir (OMNICEF) 300 MG capsule Take 2 capsules (600 mg total) by mouth daily. 05/15/22  Yes Celso Amy, NP  cholecalciferol (VITAMIN D) 1000 UNITS tablet Take 1,000 Units by mouth at bedtime.    Yes [provider]  colchicine 0.6 MG tablet Take 1 tablet (0.6 mg total) by mouth every 8 (eight) hours as needed. 09/29/21  Yes Ennever, Rudell Cobb, MD  Cyanocobalamin (B-12 PO) Take 1,000 mcg by mouth daily.    Yes [provider]  diphenoxylate-atropine (LOMOTIL) 2.5-0.025 MG tablet TAKE 2 TABLETS BY MOUTH AT ONSET OF DIARRHEA THEN TAKE 1 TABLET AFTER EACH LOOSE STOOL MAX 8 TABLETS PER DAY 05/19/22  Yes Celso Amy, NP  ELIQUIS 5 MG TABS tablet TAKE ONE TABLET BY MOUTH TWICE A DAY 03/02/22  Yes Ennever, Rudell Cobb, MD  ezetimibe (ZETIA) 10 MG tablet Take 1 tablet (10 mg total) by mouth daily. 10/15/21  Yes Turner, Eber Hong, MD  folic acid (FOLVITE) 1 MG tablet TAKE ONE TABLET BY MOUTH DAILY START 5 TO 7 DAYS BEFORE ALIMTA CHEMO AND CONTINUE UNTIL 21 DAYS AFTER COMPLETED Patient  taking differently: Take 1 mg by mouth daily. 06/16/21  Yes [name], MD  metoprolol succinate (TOPROL-XL) 25 MG 24 hr tablet Take 1 tablet (25 mg total) by mouth at bedtime. 06/03/21  Yes Turner, Eber Hong, MD  Multiple Vitamin (MULTIVITAMIN) capsule Take 1 capsule by mouth daily.   Yes [provider]  nitroGLYCERIN (NITROSTAT) 0.4 MG SL tablet Place 1 tablet (0.4 mg total) under the tongue every 5 (five) minutes as needed for chest pain. 06/03/21  Yes Turner, Eber Hong, MD  oxyCODONE-acetaminophen (PERCOCET) 7.5-325 MG tablet Take 1 tablet by mouth every 4 (four) hours as needed for severe pain. 09/09/21  Yes [name], MD  rosuvastatin (CRESTOR) 40 MG tablet Take 1 tablet (40 mg total) by mouth at bedtime. 06/03/21  Yes Turner, Eber Hong, MD  sacubitril-valsartan (ENTRESTO) 24-26 MG Take 1 tablet by mouth 2 (two) times daily. 06/03/21  Yes Turner, Eber Hong, MD  sertraline (ZOLOFT) 100 MG tablet Take 100  mg by mouth every morning. 10/08/21  Yes [provider]  sertraline (ZOLOFT) 50 MG tablet Take 50 mg by mouth at bedtime.   Yes [provider]  traMADol (ULTRAM) 50 MG tablet Take 1 tablet (50 mg total) by mouth every 6 (six) hours as needed. Patient taking differently: Take 50 mg by mouth every 6 (six) hours as needed for moderate pain or severe pain. 09/22/21  Yes Ennever, Rudell Cobb, MD  sucralfate (CARAFATE) 1 g tablet Take 1 tablet (1 g total) by mouth 4 (four) times daily. Dissolve each tablet in 15 cc water before use. Patient not taking: Reported on 05/21/2022 10/24/21   Kyung Rudd, MD  prochlorperazine (COMPAZINE) 10 MG tablet Take 1 tablet (10 mg total) by mouth every 6 (six) hours as needed (Nausea or vomiting). 06/24/20 04/25/21  [name], MD     Family History  Problem Relation Age of Onset   Lung cancer Mother    Lung cancer Father    Heart disease Father    Heart failure Father    Cancer Sister     Social History   Socioeconomic History    Marital status: Married    Spouse name: Not on file   Number of children: Not on file   Years of education: Not on file   Highest education level: Not on file  Occupational History   Not on file  Tobacco Use   Smoking status: Former    Years: 15.00    Types: Cigarettes    Quit date: 07/21/1983    Years since quitting: 38.8   Smokeless tobacco: Former    Quit date: 07/20/1986  Vaping Use   Vaping Use: Never used  Substance and Sexual Activity   Alcohol use: Yes    Alcohol/week: 20.0 - 28.0 standard drinks of alcohol    Types: 20 - 28 Standard drinks or equivalent per week    Comment: daily - 20-28 /week   Drug use: No   Sexual activity: Not Currently  Other Topics Concern   Not on file  Social History Narrative   Not on file   Social Determinants of Health   Financial Resource Strain: Not on file  Food Insecurity: Not on file  Transportation Needs: Not on file  Physical Activity: Not on file  Stress: Not on file  Social Connections: Not on file     Review of Systems: A 12 point ROS discussed and pertinent positives are indicated in the HPI above.  All other systems are negative.  Review of Systems  Constitutional:  Negative for fever.  HENT:  Negative for congestion.   Respiratory:  Negative for cough and shortness of breath.   Cardiovascular:  Negative for chest pain.  Gastrointestinal:  Negative for abdominal pain.  Neurological:  Negative for headaches.  Psychiatric/Behavioral:  Negative for behavioral problems and confusion.     Vital Signs: BP 120/72   Pulse 66   Temp 97.8 F (36.6 C) (Oral)   Resp 18   Ht 5\' 11"  (1.803 m)   Wt 188 lb (85.3 kg)   SpO2 93%   BMI 26.22 kg/m     Physical Exam Vitals and nursing note reviewed.  Constitutional:      Appearance: He is well-developed.  HENT:     Head: Normocephalic.  Cardiovascular:     Rate and Rhythm: Normal rate and regular rhythm.     Heart sounds: Normal heart sounds.  Pulmonary:      Effort: Pulmonary  effort is normal.  Musculoskeletal:        General: Normal range of motion.     Cervical back: Normal range of motion.  Skin:    General: Skin is dry.  Neurological:     Mental Status: He is alert and oriented to person, place, and time.     Imaging: DG Chest 2 View  Result Date: 05/16/2022 CLINICAL DATA:  76 year old male with cough for 2 weeks EXAM: CHEST - 2 VIEW COMPARISON:  05/04/2021, PET-CT 05/13/2022 FINDINGS: Cardiomediastinal silhouette unchanged in size and contour with the left heart border partially obscured by lung and pleural disease. Persisting opacity at the left lung base with blunting of the costophrenic angle, pleuroparenchymal thickening, obscuration of left hemidiaphragm and the left heart border. Right lung relatively well aerated with interval resolution of the mixed interstitial and airspace disease at the right lung base. No pneumothorax. No displaced fracture.  Degenerative changes of the spine. IMPRESSION: Persisting opacity at the left lung base, better characterized on recent PET-CT, and likely a combination of pleural fluid, possible atelectasis/consolidation, as well as potential malignancy (per PET imaging). Surgical changes of median sternotomy and CABG. Electronically Signed   By: Corrie Mckusick D.O.   On: 05/16/2022 07:53   NM PET Image Restag (PS) Skull Base To Thigh  Result Date: 05/15/2022 CLINICAL DATA:  Subsequent treatment strategy for small cell lung cancer. Oral chemotherapy ongoing. Stage IV lung cancer EXAM: NUCLEAR MEDICINE PET SKULL BASE TO THIGH TECHNIQUE: 9.6 mCi F-18 FDG was injected intravenously. Full-ring PET imaging was performed from the skull base to thigh after the radiotracer. CT data was obtained and used for attenuation correction and anatomic localization. Fasting blood glucose: 116 mg/dl COMPARISON:  PET CT 02/11/2022 FINDINGS: Mediastinal blood pool activity: SUV max 2.3 Liver activity: SUV max NA NECK: No  hypermetabolic lymph nodes in the neck. Incidental CT findings: None. CHEST: Again demonstrated loculated pleural effusion at the LEFT lung base with the thickened rim. The thickened rim now has intense metabolic activity with SUV max equal 16.1 which is new from prior. There is a new focus of peripheral airspace disease in the RIGHT lower lobe measuring 2.9 cm (image 54/7) with SUV max equal 5.7. Subtle small focus of metabolic peripheral nodularity in the posterior RIGHT lung apex measuring 9 mm on image 58 with SUV max equal 4.5. Incidental CT findings: None. ABDOMEN/PELVIS: Metabolic activity associated with the partially calcified LEFT adrenal metastasis with SUV max equal 6.7 compared SUV max equal 4.8. No changes CT imaging Incidental CT findings: None. SKELETON: Several sclerotic lesions again noted throughout the pelvis and spine. Sclerotic lesion the posterior LEFT iliac bone with SUV max equal 2.7 not changed from SUV max equal 2.1. However, sclerotic lesion in the T10 vertebral body (image 103) is increased with SUV max equal 4.9 compared SUV max equal 2.9. Incidental CT findings: None. IMPRESSION: 1. New metabolic activity associated with the loculated pleural effusion at the LEFT lung base with thickened rim. The thickened rim has new hypermetabolic activity concerning for recurrence of small cell lung cancer. Consider tissue sampling to exclude pulmonary infection. 2. Two foci of nodular airspace disease in the RIGHT lung are indeterminate but could represent lung cancer recurrence versus pulmonary infection. 3. Increased metabolic activity of calcified LEFT adrenal metastasis is also concerning for recurrence although no CT change. 4. Similar mildly metabolic sclerotic skeletal metastasis. One lesion at T12 is increased in metabolic activity. Electronically Signed   By: Helane Gunther.D.  On: 05/15/2022 09:07    Labs:  CBC: Recent Labs    02/23/22 1144 04/13/22 0933 05/14/22 0909  05/22/22 0726  WBC 5.2 5.1 4.2 5.9  HGB 12.7* 10.9* 11.4* 12.2*  HCT 37.3* 32.9* 34.0* 36.0*  PLT 130* 102* 132* 242    COAGS: Recent Labs    05/22/22 0726  INR 1.2    BMP: Recent Labs    01/12/22 1020 02/23/22 1144 04/13/22 0933 05/14/22 0909  NA 141 142 143 141  K 4.4 4.3 3.9 4.0  CL 105 106 107 107  CO2 31 27 29 25   GLUCOSE 90 119* 108* 116*  BUN 15 24* 17 15  CALCIUM 9.5 9.9 9.8 9.6  CREATININE 1.03 1.16 1.19 1.11  GFRNONAA >60 >60 >60 >60    LIVER FUNCTION TESTS: Recent Labs    02/23/22 1144 03/03/22 1155 04/13/22 0933 05/14/22 0909  BILITOT 0.6 0.4 0.7 0.6  AST 31 25 21  35  ALT 26 24 16  34  ALKPHOS 118 109 95 78  PROT 7.3 6.4 7.3 7.0  ALBUMIN 4.9 4.4 4.5 4.2     Assessment and Plan:  76 y.o. male outpatient. History of HTN, HLD, CAD, SCLC of the left lung. Found to have hypermetabolic thickened rim of the left lung base. Team is requesting lung biopsy for further evaluation.,   Restaging PET scan from 10.25.23 reads New metabolic activity associated with the loculated pleural effusion at the LEFT lung base with thickened rim. The thickened rim has new hypermetabolic activity concerning for recurrence of small cell lung cancer. Consider tissue sampling to exclude pulmonary infection.  All labs are within acceptable parameters. Patient is on ASA and eliquis. last dose of eliauis on 11.1.23. Patient has not stopped his ASA. IR Attending aware and agrees to proceed. No pertinent allergies. Patient has been NPO since midnight.   Risks and benefits of CT guided lung nodule biopsy was discussed with the patient including, but not limited to bleeding, hemoptysis, respiratory failure requiring intubation, infection, pneumothorax requiring chest tube placement, stroke from air embolism or even death.  All of the patient's questions were answered and the patient is agreeable to proceed.  Consent signed and in chart.   Thank you for this interesting consult.   I greatly enjoyed meeting John Hinton and look forward to participating in their care.  A copy of this report was sent to the requesting provider on this date.  Electronically Signed: Jacqualine Mau, NP 05/22/2022, 8:12 AM   I spent a total of  30 Minutes   in face to face in clinical consultation, greater than 50% of which was counseling/coordinating care for Left lung base

## 2022-05-22 ENCOUNTER — Encounter (HOSPITAL_BASED_OUTPATIENT_CLINIC_OR_DEPARTMENT_OTHER): Payer: Self-pay

## 2022-05-22 ENCOUNTER — Ambulatory Visit (HOSPITAL_COMMUNITY)
Admission: RE | Admit: 2022-05-22 | Discharge: 2022-05-22 | Disposition: A | Discharge status: Home / Self Care | Payer: Medicare Other | Source: Ambulatory Visit | Attending: Family | Admitting: Family

## 2022-05-22 ENCOUNTER — Other Ambulatory Visit: Payer: Self-pay | Admitting: *Deleted

## 2022-05-22 ENCOUNTER — Encounter (HOSPITAL_BASED_OUTPATIENT_CLINIC_OR_DEPARTMENT_OTHER): Payer: Medicare Other | Admitting: Physical Therapy

## 2022-05-22 ENCOUNTER — Other Ambulatory Visit: Payer: Self-pay

## 2022-05-22 ENCOUNTER — Encounter: Payer: Self-pay | Admitting: *Deleted

## 2022-05-22 DIAGNOSIS — Z87891 Personal history of nicotine dependence: Secondary | ICD-10-CM | POA: Diagnosis not present

## 2022-05-22 DIAGNOSIS — Z85118 Personal history of other malignant neoplasm of bronchus and lung: Secondary | ICD-10-CM | POA: Diagnosis not present

## 2022-05-22 DIAGNOSIS — I2510 Atherosclerotic heart disease of native coronary artery without angina pectoris: Secondary | ICD-10-CM | POA: Insufficient documentation

## 2022-05-22 DIAGNOSIS — Z8249 Family history of ischemic heart disease and other diseases of the circulatory system: Secondary | ICD-10-CM | POA: Insufficient documentation

## 2022-05-22 DIAGNOSIS — R059 Cough, unspecified: Secondary | ICD-10-CM | POA: Diagnosis not present

## 2022-05-22 DIAGNOSIS — C3492 Malignant neoplasm of unspecified part of left bronchus or lung: Secondary | ICD-10-CM

## 2022-05-22 DIAGNOSIS — I10 Essential (primary) hypertension: Secondary | ICD-10-CM | POA: Diagnosis not present

## 2022-05-22 DIAGNOSIS — J948 Other specified pleural conditions: Secondary | ICD-10-CM | POA: Insufficient documentation

## 2022-05-22 DIAGNOSIS — J929 Pleural plaque without asbestos: Secondary | ICD-10-CM | POA: Diagnosis not present

## 2022-05-22 DIAGNOSIS — Z801 Family history of malignant neoplasm of trachea, bronchus and lung: Secondary | ICD-10-CM | POA: Diagnosis not present

## 2022-05-22 LAB — CBC
HCT: 36 % — ABNORMAL LOW (ref 39.0–52.0)
Hemoglobin: 12.2 g/dL — ABNORMAL LOW (ref 13.0–17.0)
MCH: 35.1 pg — ABNORMAL HIGH (ref 26.0–34.0)
MCHC: 33.9 g/dL (ref 30.0–36.0)
MCV: 103.4 fL — ABNORMAL HIGH (ref 80.0–100.0)
Platelets: 242 10*3/uL (ref 150–400)
RBC: 3.48 MIL/uL — ABNORMAL LOW (ref 4.22–5.81)
RDW: 13.6 % (ref 11.5–15.5)
WBC: 5.9 10*3/uL (ref 4.0–10.5)
nRBC: 0 % (ref 0.0–0.2)

## 2022-05-22 LAB — PROTIME-INR
INR: 1.2 (ref 0.8–1.2)
Prothrombin Time: 14.9 seconds (ref 11.4–15.2)

## 2022-05-22 MED ORDER — MIDAZOLAM HCL 2 MG/2ML IJ SOLN
INTRAMUSCULAR | Status: AC | PRN
  Administered 2022-05-22 (×2): 1 mg via INTRAVENOUS

## 2022-05-22 MED ORDER — MIDAZOLAM HCL 2 MG/2ML IJ SOLN
INTRAMUSCULAR | Status: AC
  Filled 2022-05-22: qty 4

## 2022-05-22 MED ORDER — SODIUM CHLORIDE 0.9 % IV SOLN: INTRAVENOUS | Status: DC

## 2022-05-22 MED ORDER — FENTANYL CITRATE (PF) 100 MCG/2ML IJ SOLN
INTRAMUSCULAR | Status: AC | PRN
  Administered 2022-05-22: 25 ug via INTRAVENOUS
  Administered 2022-05-22: 50 ug via INTRAVENOUS

## 2022-05-22 MED ORDER — FENTANYL CITRATE (PF) 100 MCG/2ML IJ SOLN
INTRAMUSCULAR | Status: AC
  Filled 2022-05-22: qty 4

## 2022-05-22 MED ORDER — METHYLPREDNISOLONE 4 MG PO TBPK: ORAL_TABLET | ORAL | 0 refills | Status: DC

## 2022-05-22 MED ORDER — HYDROCODONE BIT-HOMATROP MBR 5-1.5 MG/5ML PO SOLN: 5.0000 mL | Freq: Four times a day (QID) | ORAL | 0 refills | Status: DC | PRN

## 2022-05-22 MED ORDER — LIDOCAINE HCL (PF) 1 % IJ SOLN
5.0000 mL | Freq: Once | INTRAMUSCULAR | Status: AC
  Administered 2022-05-22: 5 mL
  Filled 2022-05-22: qty 5

## 2022-05-22 NOTE — Procedures (Signed)
Pre Procedure Dx: Hypermetabolic left basilar pleural thickening. Post Procedural Dx: Same  Technically successful [place] and CT guided biopsy of left basilar pleural thickening.  EBL: None No immediate complications.   Ronny Bacon, MD Pager #: [phone]

## 2022-05-22 NOTE — Progress Notes (Signed)
Patient had a technically successful pleura biopsy today. Will follow for results.   Please see MyChart communication dated 04/14/2022. Received message from patient's wife that he still has significant coughing and that it's impacting his daily life. Spoke with Dr Marin Olp and prescriptions were sent.   Oncology Nurse Navigator Documentation     05/22/2022   11:00 AM  Oncology Nurse Navigator Flowsheets  Navigator Follow Up Date: 05/26/2022  Navigator Follow Up Reason: Pathology  Navigator Location [facility]  Navigator Encounter Type MyChart;Appt/Treatment Plan Review  Patient Visit Type MedOnc  Treatment Phase Active Tx  Barriers/Navigation Needs Coordination of Care  Education Pain/ Symptom Management  Interventions Medication Assistance;Education  Acuity Level 1-No Barriers  Education Method Written  Support Groups/Services Friends and Family  Time Spent with Patient 15

## 2022-05-25 ENCOUNTER — Encounter: Payer: Self-pay | Admitting: *Deleted

## 2022-05-25 LAB — SURGICAL PATHOLOGY

## 2022-05-25 NOTE — Progress Notes (Signed)
Biopsy results reviewed with Dr Marin Olp. Results negative for mesothelium, tumor and atypia.   Due to negative biopsy, loculated effusion, and continued symptoms, Dr Marin Olp would like patient to be seen by his previous surgeon, Dr Kipp Brood regarding possible intervention.   Called and spoke to Drayton [phone]) at Dr Abran Duke office. She scheduled patient this Friday, November 10th at 12:00p.   Called and spoke to patient. He is aware of biopsy results and appointment with Dr Kipp Brood.   Oncology Nurse Navigator Documentation     05/25/2022   10:45 AM  Oncology Nurse Navigator Flowsheets  Navigator Follow Up Date: 05/29/2022  Navigator Follow Up Reason: Review Note  Navigator Location [facility]  Navigator Encounter Type Pathology Review;Telephone  Patient Visit Type MedOnc  Treatment Phase Active Tx  Barriers/Navigation Needs Coordination of Care  Interventions Coordination of Care;Education;Psycho-Social Support;Referrals  Acuity Level 1-No Barriers  Referrals Other  Coordination of Care Appts  Support Groups/Services Friends and Family  Time Spent with Patient 30

## 2022-05-28 ENCOUNTER — Other Ambulatory Visit: Payer: Self-pay | Admitting: *Deleted

## 2022-05-28 MED ORDER — HYDROCODONE BIT-HOMATROP MBR 5-1.5 MG/5ML PO SOLN: 5.0000 mL | Freq: Four times a day (QID) | ORAL | 0 refills | Status: DC | PRN

## 2022-05-29 ENCOUNTER — Institutional Professional Consult (permissible substitution) (INDEPENDENT_AMBULATORY_CARE_PROVIDER_SITE_OTHER): Payer: Medicare Other | Admitting: Thoracic Surgery (Cardiothoracic Vascular Surgery)

## 2022-05-29 ENCOUNTER — Encounter: Payer: Self-pay | Admitting: *Deleted

## 2022-05-29 VITALS — BP 140/70 | HR 61 | Resp 20 | Ht 71.0 in | Wt 188.0 lb

## 2022-05-29 DIAGNOSIS — J90 Pleural effusion, not elsewhere classified: Secondary | ICD-10-CM

## 2022-05-29 DIAGNOSIS — C3492 Malignant neoplasm of unspecified part of left bronchus or lung: Secondary | ICD-10-CM | POA: Diagnosis not present

## 2022-05-29 NOTE — Progress Notes (Unsigned)
Patient seen today by Dr Kipp Brood.

## 2022-06-01 ENCOUNTER — Encounter: Payer: Self-pay | Admitting: Hematology & Oncology

## 2022-06-01 ENCOUNTER — Inpatient Hospital Stay (HOSPITAL_BASED_OUTPATIENT_CLINIC_OR_DEPARTMENT_OTHER): Payer: Medicare Other | Admitting: Hematology & Oncology

## 2022-06-01 ENCOUNTER — Inpatient Hospital Stay: Payer: Medicare Other | Attending: Hematology & Oncology

## 2022-06-01 ENCOUNTER — Inpatient Hospital Stay: Payer: Medicare Other

## 2022-06-01 VITALS — BP 110/68 | HR 71 | Temp 97.6°F | Resp 20 | Ht 71.0 in | Wt 178.4 lb

## 2022-06-01 DIAGNOSIS — C7971 Secondary malignant neoplasm of right adrenal gland: Secondary | ICD-10-CM | POA: Insufficient documentation

## 2022-06-01 DIAGNOSIS — Z79899 Other long term (current) drug therapy: Secondary | ICD-10-CM | POA: Insufficient documentation

## 2022-06-01 DIAGNOSIS — C7972 Secondary malignant neoplasm of left adrenal gland: Secondary | ICD-10-CM | POA: Diagnosis not present

## 2022-06-01 DIAGNOSIS — C3492 Malignant neoplasm of unspecified part of left bronchus or lung: Secondary | ICD-10-CM | POA: Diagnosis not present

## 2022-06-01 DIAGNOSIS — C7951 Secondary malignant neoplasm of bone: Secondary | ICD-10-CM | POA: Insufficient documentation

## 2022-06-01 DIAGNOSIS — R93811 Abnormal radiologic findings on diagnostic imaging of right testicle: Secondary | ICD-10-CM | POA: Diagnosis not present

## 2022-06-01 DIAGNOSIS — C3412 Malignant neoplasm of upper lobe, left bronchus or lung: Secondary | ICD-10-CM | POA: Diagnosis not present

## 2022-06-01 DIAGNOSIS — J90 Pleural effusion, not elsewhere classified: Secondary | ICD-10-CM | POA: Insufficient documentation

## 2022-06-01 LAB — FERRITIN: Ferritin: 731 ng/mL — ABNORMAL HIGH (ref 24–336)

## 2022-06-01 LAB — CMP (CANCER CENTER ONLY)
ALT: 21 U/L (ref 0–44)
AST: 22 U/L (ref 15–41)
Albumin: 4.1 g/dL (ref 3.5–5.0)
Alkaline Phosphatase: 89 U/L (ref 38–126)
Anion gap: 7 (ref 5–15)
BUN: 39 mg/dL — ABNORMAL HIGH (ref 8–23)
CO2: 31 mmol/L (ref 22–32)
Calcium: 9.8 mg/dL (ref 8.9–10.3)
Chloride: 106 mmol/L (ref 98–111)
Creatinine: 1.45 mg/dL — ABNORMAL HIGH (ref 0.61–1.24)
GFR, Estimated: 51 mL/min — ABNORMAL LOW (ref >60)
Glucose, Bld: 127 mg/dL — ABNORMAL HIGH (ref 70–99)
Potassium: 4 mmol/L (ref 3.5–5.1)
Sodium: 144 mmol/L (ref 135–145)
Total Bilirubin: 0.4 mg/dL (ref 0.3–1.2)
Total Protein: 6.9 g/dL (ref 6.5–8.1)

## 2022-06-01 LAB — RETICULOCYTES
Immature Retic Fract: 9.8 % (ref 2.3–15.9)
RBC.: 3.49 MIL/uL — ABNORMAL LOW (ref 4.22–5.81)
Retic Count, Absolute: 93.5 10*3/uL (ref 19.0–186.0)
Retic Ct Pct: 2.7 % (ref 0.4–3.1)

## 2022-06-01 LAB — CBC WITH DIFFERENTIAL (CANCER CENTER ONLY)
Abs Immature Granulocytes: 0.1 10*3/uL — ABNORMAL HIGH (ref 0.00–0.07)
Basophils Absolute: 0 10*3/uL (ref 0.0–0.1)
Basophils Relative: 0 %
Eosinophils Absolute: 0.1 10*3/uL (ref 0.0–0.5)
Eosinophils Relative: 1 %
HCT: 36.1 % — ABNORMAL LOW (ref 39.0–52.0)
Hemoglobin: 11.8 g/dL — ABNORMAL LOW (ref 13.0–17.0)
Immature Granulocytes: 2 %
Lymphocytes Relative: 6 %
Lymphs Abs: 0.4 10*3/uL — ABNORMAL LOW (ref 0.7–4.0)
MCH: 33.8 pg (ref 26.0–34.0)
MCHC: 32.7 g/dL (ref 30.0–36.0)
MCV: 103.4 fL — ABNORMAL HIGH (ref 80.0–100.0)
Monocytes Absolute: 0.4 10*3/uL (ref 0.1–1.0)
Monocytes Relative: 7 %
Neutro Abs: 5 10*3/uL (ref 1.7–7.7)
Neutrophils Relative %: 84 %
Platelet Count: 125 10*3/uL — ABNORMAL LOW (ref 150–400)
RBC: 3.49 MIL/uL — ABNORMAL LOW (ref 4.22–5.81)
RDW: 14.1 % (ref 11.5–15.5)
WBC Count: 6 10*3/uL (ref 4.0–10.5)
nRBC: 0 % (ref 0.0–0.2)

## 2022-06-01 LAB — LACTATE DEHYDROGENASE: LDH: 200 U/L — ABNORMAL HIGH (ref 98–192)

## 2022-06-01 LAB — IRON AND IRON BINDING CAPACITY (CC-WL,HP ONLY)
Iron: 73 ug/dL (ref 45–182)
Saturation Ratios: 24 % (ref 17.9–39.5)
TIBC: 311 ug/dL (ref 250–450)
UIBC: 238 ug/dL (ref 117–376)

## 2022-06-01 MED ORDER — DEXTROSE 5 % IV SOLN
2.0000 g | Freq: Once | INTRAVENOUS | Status: AC
  Administered 2022-06-01: 2 g via INTRAVENOUS
  Filled 2022-06-01: qty 20

## 2022-06-01 MED ORDER — SODIUM CHLORIDE 0.9 % IV SOLN
20.0000 mg | Freq: Once | INTRAVENOUS | Status: AC
  Administered 2022-06-01: 20 mg via INTRAVENOUS
  Filled 2022-06-01: qty 20

## 2022-06-01 MED ORDER — SODIUM CHLORIDE 0.9 % IV SOLN: INTRAVENOUS | Status: AC

## 2022-06-01 MED ORDER — SODIUM CHLORIDE 0.9 % IV SOLN
40.0000 mg | Freq: Once | INTRAVENOUS | Status: AC
  Administered 2022-06-01: 40 mg via INTRAVENOUS
  Filled 2022-06-01: qty 4

## 2022-06-01 NOTE — Patient Instructions (Signed)
Ceftriaxone Injection What is this medication? CEFTRIAXONE (sef try AX one) treats infections caused by bacteria. It belongs to a group of medications called cephalosporin antibiotics. It will not treat colds, the flu, or infections caused by viruses. This medicine may be used for other purposes; ask your health care provider or pharmacist if you have questions. COMMON BRAND NAME(S): Ceftrisol Plus, Rocephin What should I tell my care team before I take this medication? They need to know if you have any of these conditions: Bleeding disorder High bilirubin level in newborn patients Kidney disease Liver disease Poor nutrition An unusual or allergic reaction to ceftriaxone, other penicillin or cephalosporin antibiotics, other medications, foods, dyes, or preservatives Pregnant or trying to get pregnant Breast-feeding How should I use this medication? This medication is injected into a vein or a muscle. It is usually given by your care team in a hospital or clinic setting. It may also be given at home. If you get this medication at home, you will be taught how to prepare and give it. Use exactly as directed. Take it as directed on the prescription label at the same time every day. Keep taking it even if you think you are better. It is important that you put your used needles and syringes in a special sharps container. Do not put them in a trash can. If you do not have a sharps container, call your pharmacist or care team to get one. Talk to your care team about the use of this medication in children. While it may be prescribed for children as young as newborns for selected conditions, precautions do apply. Overdosage: If you think you have taken too much of this medicine contact a poison control center or emergency room at once. NOTE: This medicine is only for you. Do not share this medicine with others. What if I miss a dose? If you get this medication at the hospital or clinic: It is important  not to miss your dose. Call your care team if you are unable to keep an appointment. If you give yourself this medication at home: If you miss a dose, take it as soon as you can. Then continue your normal schedule. If it is almost time for your next dose, take only that dose. Do not take double or extra doses. Call your care team with questions. What may interact with this medication? Estrogen or progestin hormones Intravenous calcium This list may not describe all possible interactions. Give your health care provider a list of all the medicines, herbs, non-prescription drugs, or dietary supplements you use. Also tell them if you smoke, drink alcohol, or use illegal drugs. Some items may interact with your medicine. What should I watch for while using this medication? Tell your care team if your symptoms do not start to get better or if they get worse. Do not treat diarrhea with over the counter products. Contact your care team if you have diarrhea that lasts more than 2 days or if it is severe and watery. If you have diabetes, you may get a false-positive result for sugar in your urine. Check with your care team. If you are being treated for a sexually transmitted infection (STI), avoid sexual contact until you have finished your treatment. Your partner may also need treatment. What side effects may I notice from receiving this medication? Side effects that you should report to your care team as soon as possible: Allergic reactions--skin rash, itching, hives, swelling of the face, lips, tongue, or throat  Hemolytic anemia--unusual weakness or fatigue, dizziness, headache, trouble breathing, dark urine, yellowing skin or eyes Severe diarrhea, fever Unusual vaginal discharge, itching, or odor Side effects that usually do not require medical attention (report to your care team if they continue or are bothersome): Diarrhea Headache Nausea Pain, redness, or irritation at injection site This list may  not describe all possible side effects. Call your doctor for medical advice about side effects. You may report side effects to FDA at 1-800-FDA-1088. Where should I keep my medication? Keep out of the reach of children and pets. You will be instructed on how to store this medication. Get rid of any unused medication after the expiration date. To get rid of medications that are no longer needed or have expired: Take the medication to a medication take-back program. Check with your pharmacy or law enforcement to find a location. If you cannot return the medication, ask your pharmacist or care team how to get rid of this medication safely. NOTE: This sheet is a summary. It may not cover all possible information. If you have questions about this medicine, talk to your doctor, pharmacist, or health care provider.  2023 Elsevier/Gold Standard (2007-08-27 00:00:00)

## 2022-06-01 NOTE — Progress Notes (Signed)
Hematology and Oncology Follow Up Visit  John Hinton [id number] 22-Mar-1946 76 y.o. 06/01/2022   Principle Diagnosis:  Stage IVA (T3N2N0) adenocarcinoma of the LUL -- resected -- (+) KRAS -- progression - metastasis to right lung, bilateral adrenal glands, bony metastasis to T1, Y 10 and left iliac bone PE involving the right lower lobe and right middle lobe branches, no right heart strain noted  Past Therapy: S/P cycle 6 of Carbo/Alimta/Pembrolizumab Alimta/pembrolizumab-maintenance therapy s/p cycle 3/4  - start on 11/28/2020 --Alimta discontinued after cycle 2   Current Therapy:        Lumakras 360 mg po q day -- started on 07/31/2021 -- d/c on 02/07/203 for toxicity Xgeva every 3 months - due again 05/2022 Eliquis 5 mg PO BID Adagrasib  (Krazati)200 mg po BID -- start on 09/22/2021   Interim History:  John Hinton is here today for follow-up.  Unfortunately, he clearly has declined.  He is lost quite a bit of weight since we last saw him.  When we last saw him, he was headed over to [place] for the [employer].  He had a good time over there despite the fact that the [employer] team did not play well.  Since he came back, he has not done well.  He has had quite a few issues.  He seems more confused.  Again he is lost quite a bit of weight.  He is not eating.  We did go ahead and do a PET scan on him.  The PET scan was done on 05/13/2022.  The PET scan was quite troublesome for the possibility of recurrent disease.  He had a new metabolically active loculated pleural effusion on the left lung.  There was a metabolically active calcified left adrenal met which was felt to be concerning for recurrence.  He has stable sclerotic skeletal lesions.  He subsequently underwent a biopsy of the left pleura.  This was on 05/22/2022.  The pathology report ([id number]) showed a very limited sample.  There was fibroconnective tissue.  There is no active cancer.  He has seen Dr. Kipp Brood of  thoracic surgery.  Their question now is whether or not he needs to have a VATS to help get tissue to see if there is recurrence.  I be worried about recurrence just from how he looks.  Again he has his confusion.  He did not have this before.  He needs an MRI of the brain to make sure there is no CNS metastasis.  He has not complained of any pain.  He just is not eating much.  He is having some balance issues.  I think he may have fallen but thankfully has not done any damage.  There is no problems with bowel or bladder incontinence.  He has been on the Lehr period I think he is doing pretty well with this.  Currently, I would say his performance status is probably ECOG 2.  Medications:  Allergies as of 06/01/2022       Reactions   Amlodipine Swelling   Swelling of ankles        Medication List        Accurate as of June 01, 2022 10:05 AM. If you have any questions, ask your nurse or doctor.          STOP taking these medications    cefdinir 300 MG capsule Commonly known as: OMNICEF Stopped by: [name], MD   methylPREDNISolone 4 MG Tbpk tablet Commonly known as: MEDROL  DOSEPAK Stopped by: [name], MD       TAKE these medications    adagrasib 200 MG tablet Commonly known as: KRAZATI Take 2 tablets (400 mg total) by mouth 2 (two) times daily. What changed: how much to take   aspirin 81 MG tablet Take 81 mg by mouth at bedtime.   B-12 PO Take 1,000 mcg by mouth daily.   benzonatate 200 MG capsule Commonly known as: TESSALON Take 1 capsule (200 mg total) by mouth 3 (three) times daily as needed for cough.   cholecalciferol 1000 units tablet Commonly known as: VITAMIN D Take 1,000 Units by mouth at bedtime.   colchicine 0.6 MG tablet Take 1 tablet (0.6 mg total) by mouth every 8 (eight) hours as needed.   diphenoxylate-atropine 2.5-0.025 MG tablet Commonly known as: LOMOTIL TAKE 2 TABLETS BY MOUTH AT ONSET OF DIARRHEA THEN  TAKE 1 TABLET AFTER EACH LOOSE STOOL MAX 8 TABLETS PER DAY   Eliquis 5 MG Tabs tablet Generic drug: apixaban TAKE ONE TABLET BY MOUTH TWICE A DAY   Entresto 24-26 MG Generic drug: sacubitril-valsartan Take 1 tablet by mouth 2 (two) times daily.   ezetimibe 10 MG tablet Commonly known as: ZETIA Take 1 tablet (10 mg total) by mouth daily.   folic acid 1 MG tablet Commonly known as: FOLVITE TAKE ONE TABLET BY MOUTH DAILY START 5 TO 7 DAYS BEFORE ALIMTA CHEMO AND CONTINUE UNTIL 21 DAYS AFTER COMPLETED What changed: See the new instructions.   HYDROcodone bit-homatropine 5-1.5 MG/5ML syrup Commonly known as: HYCODAN Take 5 mLs by mouth every 6 (six) hours as needed for cough.   metoprolol succinate 25 MG 24 hr tablet Commonly known as: TOPROL-XL Take 1 tablet (25 mg total) by mouth at bedtime.   multivitamin capsule Take 1 capsule by mouth daily.   nitroGLYCERIN 0.4 MG SL tablet Commonly known as: NITROSTAT Place 1 tablet (0.4 mg total) under the tongue every 5 (five) minutes as needed for chest pain.   oxyCODONE-acetaminophen 7.5-325 MG tablet Commonly known as: Percocet Take 1 tablet by mouth every 4 (four) hours as needed for severe pain.   rosuvastatin 40 MG tablet Commonly known as: CRESTOR Take 1 tablet (40 mg total) by mouth at bedtime.   sertraline 50 MG tablet Commonly known as: ZOLOFT Take 50 mg by mouth at bedtime.   sertraline 100 MG tablet Commonly known as: ZOLOFT Take 100 mg by mouth every morning.   sucralfate 1 g tablet Commonly known as: Carafate Take 1 tablet (1 g total) by mouth 4 (four) times daily. Dissolve each tablet in 15 cc water before use.   traMADol 50 MG tablet Commonly known as: ULTRAM Take 1 tablet (50 mg total) by mouth every 6 (six) hours as needed. What changed: reasons to take this        Allergies:  Allergies  Allergen Reactions   Amlodipine Swelling    Swelling of ankles    Past Medical History, Surgical history,  Social history, and Family History were reviewed and updated.  Review of Systems: Review of Systems  Constitutional: Negative.   HENT: Negative.    Eyes: Negative.   Respiratory: Negative.    Cardiovascular: Negative.   Gastrointestinal: Negative.   Genitourinary: Negative.   Musculoskeletal: Negative.   Skin: Negative.   Neurological: Negative.   Endo/Heme/Allergies: Negative.   Psychiatric/Behavioral: Negative.       Physical Exam:  height is _0  (1.803 m) and weight is 178 lb 6.4  oz (80.9 kg). His oral temperature is 97.6 F (36.4 C). His blood pressure is 110/68 and his pulse is 71. His respiration is 20 and oxygen saturation is 100%.   Wt Readings from Last 3 Encounters:  06/01/22 178 lb 6.4 oz (80.9 kg)  05/29/22 188 lb (85.3 kg)  05/22/22 188 lb (85.3 kg)    Physical Exam Vitals reviewed.  HENT:     Head: Normocephalic and atraumatic.  Eyes:     Pupils: Pupils are equal, round, and reactive to light.  Cardiovascular:     Rate and Rhythm: Normal rate and regular rhythm.     Heart sounds: Normal heart sounds.  Pulmonary:     Effort: Pulmonary effort is normal.     Breath sounds: Normal breath sounds.  Abdominal:     General: Bowel sounds are normal.     Palpations: Abdomen is soft.  Musculoskeletal:        General: No tenderness or deformity. Normal range of motion.     Cervical back: Normal range of motion.  Lymphadenopathy:     Cervical: No cervical adenopathy.  Skin:    General: Skin is warm and dry.     Findings: No erythema or rash.  Neurological:     Mental Status: He is alert and oriented to person, place, and time.  Psychiatric:        Behavior: Behavior normal.        Thought Content: Thought content normal.        Judgment: Judgment normal.    Lab Results  Component Value Date   WBC 6.0 06/01/2022   HGB 11.8 (L) 06/01/2022   HCT 36.1 (L) 06/01/2022   MCV 103.4 (H) 06/01/2022   PLT 125 (L) 06/01/2022   Lab Results  Component Value  Date   FERRITIN 758 (H) 10/25/2020   IRON 69 10/25/2020   TIBC 321 10/25/2020   UIBC 252 10/25/2020   IRONPCTSAT 22 10/25/2020   Lab Results  Component Value Date   RETICCTPCT 2.7 06/01/2022   RBC 3.49 (L) 06/01/2022   No results found for: "KPAFRELGTCHN", "LAMBDASER", "KAPLAMBRATIO" No results found for: "IGGSERUM", "IGA", "IGMSERUM" No results found for: "TOTALPROTELP", "ALBUMINELP", "A1GS", "A2GS", "BETS", "BETA2SER", "GAMS", "MSPIKE", "SPEI"   Chemistry      Component Value Date/Time   NA 144 06/01/2022 0914   NA 142 06/19/2021 1032   K 4.0 06/01/2022 0914   CL 106 06/01/2022 0914   CO2 31 06/01/2022 0914   BUN 39 (H) 06/01/2022 0914   BUN 12 06/19/2021 1032   CREATININE 1.45 (H) 06/01/2022 0914   CREATININE 1.01 03/26/2016 1009      Component Value Date/Time   CALCIUM 9.8 06/01/2022 0914   ALKPHOS 89 06/01/2022 0914   AST 22 06/01/2022 0914   ALT 21 06/01/2022 0914   BILITOT 0.4 06/01/2022 0914       Impression and Plan: Mr. Frutiger is a very pleasant 76 yo caucasian male with an atypical stage IV lung cancer which was previously resected followed by systemic chemotherapy and maintenance pembrolizumab.   When he recurred, we put him on Lumakras.  He could not tolerate this because of elevated liver function studies.  We now have him on adagrasib.  I would have to believe that he is doing okay with this.  I do worry about the fact that he may be progressing.  We will get an MRI of the brain.  We will see what that looks like.  I think if  he does have CNS metastasis, then I think we can safely say that he is progressing and that we probably will get him off the adagrasib.  If the MRI of the brain is negative, then I do think he is probably going need to have a VATS procedure so that we can actually get tissue that is meaningful and figure out what might be going on.  He does appear to be dehydrated.  I will give him some IV fluid in the office.  I will give him a  dose of Decadron.  He will get a dose of IV antibiotic with Rocephin.  I just hate the fact that he has declined somewhat since we last saw him about 6 weeks ago.  I am glad that he was able to make it over to [place].  He and his wife are supposed to go down to [place] on Sunday for Thanksgiving.  I told them that I do not think that he should be going unless we see that his scans were all unremarkable.  Again, I just hate that he has declined.  The weight loss is certainly troublesome.  We will going ahead to get him back quickly.  We will see about getting the MRI of the brain tomorrow.  I will speak with Dr. Kipp Brood about the possibility of a VATS.    [name], MD 11/13/202310:05 AM

## 2022-06-01 NOTE — Progress Notes (Signed)
[address]       [place] [address]             [phone]                    John Hinton [facility] Medical Record #[id number] Date of Birth: Jan 08, 1946  Referring: [name], MD Primary Care: [name], MD Primary Cardiologist: Fransico Him, MD  Chief Complaint:    Chief Complaint  Patient presents with   Pleural Effusion    Surgical consult, PET Scan 05/13/22/ CT BX 05/22/22/ HX of Left upper lobectomy 05/2020    History of Present Illness:    John Hinton 76 y.o. male presents for surgical evaluation of left pleural space.  In the setting of a loculated effusion.  In November 2021, he underwent a robotic assisted left upper lobectomy for a [id number], stage IV adenocarcinoma.  He subsequently underwent adjuvant therapy.  He has done fine this therapy until recently were he had the PET scan which showed the possibility of new disease.  Admits that he recently came back from a trip to [place] where he had potential upper respiratory infection.  He has been coughing ever since that trip.  He also underwent a CT-guided biopsy of an area in the pleura with avidity on PET scan.  This biopsy was negative for any malignancy.     Past Medical History:  Diagnosis Date   BPH (benign prostatic hypertrophy)    Bradycardia    a. H/o asymptomatic bradycardia.   Coronary artery disease    a. s/p CABG ~2007 with LIMA to LAD, SVG seq to OM1 and OM2, SVG to PDA and SVG to diag. b. Abnormal nuc 03/2015 - s/p DES to SVG-OM1-OM2. 01/29/17 POBA with cutting balloon to SVG-->OM.  No ischemia Lexi myoview 2021   DCM (dilated cardiomyopathy) ([facility]) 09/17/2015   mixed by cMRI 03/2021 with EF 39% with non viable infarct in basal to mid lateral wall and LGE pattern c/w nonischemic component as well. Mild RV dysfunction RVEF 42%.   Dyslipidemia    Dyspnea    r/t lung mass   ED (erectile dysfunction)    Fear of heights    Goals of care, counseling/discussion  06/19/2020   History of radiation therapy    T spine and left ilium 10/07/2021-10/20/2021  Dr Gery Pray   Hypertension    Non-small cell carcinoma of lung, stage 4, left ([facility]) 06/19/2020   OSA (obstructive sleep apnea) 07/12/2015   Moderate OSA with AHI 16.5/hr now on CPAP at 8cm H2O, uses cpap nightly      RBBB    Shoulder, capsulitis, adhesive    Left Shoulder    Past Surgical History:  Procedure Laterality Date   APPENDECTOMY     BRONCHIAL BIOPSY  05/09/2020   Procedure: BRONCHIAL BIOPSIES;  Surgeon: [name], DO;  Location: [facility];  Service: Pulmonary;;   BRONCHIAL BRUSHINGS  05/09/2020   Procedure: BRONCHIAL BRUSHINGS;  Surgeon: [name], DO;  Location: [facility];  Service: Pulmonary;;   BRONCHIAL NEEDLE ASPIRATION BIOPSY  05/09/2020   Procedure: BRONCHIAL NEEDLE ASPIRATION BIOPSIES;  Surgeon: [name], DO;  Location: [facility];  Service: Pulmonary;;   BRONCHIAL WASHINGS  05/09/2020   Procedure: BRONCHIAL WASHINGS;  Surgeon: [name], DO;  Location: [facility];  Service: Pulmonary;;   CARDIAC CATHETERIZATION N/A 04/05/2015   Procedure: Left Heart Cath and Coronary Angiography;  Surgeon: Sherren Mocha, MD;  Location: [facility];  Service: Cardiovascular;  Laterality: N/A;   COLONOSCOPY     CORONARY ARTERY BYPASS GRAFT     w/LIMA to LAD, seq SVG to OM1 and OM2, SVG to PDA and SVG to Diagonal   CORONARY BALLOON ANGIOPLASTY N/A 01/29/2017   Procedure: Coronary Balloon Angioplasty;  Surgeon: Martinique, Peter M, MD;  Location: [facility];  Service: Cardiovascular;  Laterality: N/A;   FIDUCIAL MARKER PLACEMENT  05/09/2020   Procedure: FIDUCIAL MARKER PLACEMENT;  Surgeon: [name], DO;  Location: [facility];  Service: Pulmonary;;   INTERCOSTAL NERVE BLOCK Left 05/22/2020   Procedure: INTERCOSTAL NERVE BLOCK;  Surgeon: Lajuana Matte, MD;  Location: [facility];  Service: Thoracic;  Laterality: Left;   LEFT HEART CATH  AND CORS/GRAFTS ANGIOGRAPHY N/A 01/29/2017   Procedure: Left Heart Cath and Cors/Grafts Angiography;  Surgeon: Martinique, Peter M, MD;  Location: [facility];  Service: Cardiovascular;  Laterality: N/A;   NODE DISSECTION N/A 05/22/2020   Procedure: NODE DISSECTION;  Surgeon: Lajuana Matte, MD;  Location: [facility];  Service: Thoracic;  Laterality: N/A;   VIDEO BRONCHOSCOPY WITH ENDOBRONCHIAL NAVIGATION N/A 05/09/2020   Procedure: VIDEO BRONCHOSCOPY WITH ENDOBRONCHIAL NAVIGATION;  Surgeon: [name], DO;  Location: [facility];  Service: Pulmonary;  Laterality: N/A;   VIDEO BRONCHOSCOPY WITH ENDOBRONCHIAL ULTRASOUND N/A 05/09/2020   Procedure: VIDEO BRONCHOSCOPY WITH ENDOBRONCHIAL ULTRASOUND;  Surgeon: [name], DO;  Location: [facility];  Service: Pulmonary;  Laterality: N/A;    Family History  Problem Relation Age of Onset   Lung cancer Mother    Lung cancer Father    Heart disease Father    Heart failure Father    Cancer Sister      Social History   Tobacco Use  Smoking Status Former   Years: 15.00   Types: Cigarettes   Quit date: 07/21/1983   Years since quitting: 38.8  Smokeless Tobacco Former   Quit date: 07/20/1986    Social History   Substance and Sexual Activity  Alcohol Use Yes   Alcohol/week: 20.0 - 28.0 standard drinks of alcohol   Types: 20 - 28 Standard drinks or equivalent per week   Comment: daily - 20-28 /week     Allergies  Allergen Reactions   Amlodipine Swelling    Swelling of ankles    Current Outpatient Medications  Medication Sig Dispense Refill   adagrasib (KRAZATI) 200 MG tablet Take 2 tablets (400 mg total) by mouth 2 (two) times daily. 120 tablet 4   aspirin 81 MG tablet Take 81 mg by mouth at bedtime.      benzonatate (TESSALON) 200 MG capsule Take 1 capsule (200 mg total) by mouth 3 (three) times daily as needed for cough. 30 capsule 1   cefdinir (OMNICEF) 300 MG capsule Take 2 capsules (600 mg total) by mouth daily.  14 capsule 0   cholecalciferol (VITAMIN D) 1000 UNITS tablet Take 1,000 Units by mouth at bedtime.      colchicine 0.6 MG tablet Take 1 tablet (0.6 mg total) by mouth every 8 (eight) hours as needed. 60 tablet 1   Cyanocobalamin (B-12 PO) Take 1,000 mcg by mouth daily.      diphenoxylate-atropine (LOMOTIL) 2.5-0.025 MG tablet TAKE 2 TABLETS BY MOUTH AT ONSET OF DIARRHEA THEN TAKE 1 TABLET AFTER EACH LOOSE STOOL MAX 8 TABLETS PER DAY 100 tablet 0   ELIQUIS 5 MG TABS tablet TAKE ONE TABLET BY MOUTH TWICE A DAY  60 tablet 3   ezetimibe (ZETIA) 10 MG tablet Take 1 tablet (10 mg total) by mouth daily. 90 tablet 3   folic acid (FOLVITE) 1 MG tablet TAKE ONE TABLET BY MOUTH DAILY START 5 TO 7 DAYS BEFORE ALIMTA CHEMO AND CONTINUE UNTIL 21 DAYS AFTER COMPLETED (Patient taking differently: Take 1 mg by mouth daily.) 90 tablet 3   HYDROcodone bit-homatropine (HYCODAN) 5-1.5 MG/5ML syrup Take 5 mLs by mouth every 6 (six) hours as needed for cough. 120 mL 0   methylPREDNISolone (MEDROL DOSEPAK) 4 MG TBPK tablet Take per package instructions 1 each 0   metoprolol succinate (TOPROL-XL) 25 MG 24 hr tablet Take 1 tablet (25 mg total) by mouth at bedtime. 90 tablet 3   Multiple Vitamin (MULTIVITAMIN) capsule Take 1 capsule by mouth daily.     nitroGLYCERIN (NITROSTAT) 0.4 MG SL tablet Place 1 tablet (0.4 mg total) under the tongue every 5 (five) minutes as needed for chest pain. 25 tablet 3   oxyCODONE-acetaminophen (PERCOCET) 7.5-325 MG tablet Take 1 tablet by mouth every 4 (four) hours as needed for severe pain. 60 tablet 0   rosuvastatin (CRESTOR) 40 MG tablet Take 1 tablet (40 mg total) by mouth at bedtime. 90 tablet 3   sacubitril-valsartan (ENTRESTO) 24-26 MG Take 1 tablet by mouth 2 (two) times daily. 60 tablet 11   sertraline (ZOLOFT) 100 MG tablet Take 100 mg by mouth every morning.     sertraline (ZOLOFT) 50 MG tablet Take 50 mg by mouth at bedtime.     sucralfate (CARAFATE) 1 g tablet Take 1 tablet (1 g  total) by mouth 4 (four) times daily. Dissolve each tablet in 15 cc water before use. (Patient not taking: Reported on 05/21/2022) 120 tablet 2   traMADol (ULTRAM) 50 MG tablet Take 1 tablet (50 mg total) by mouth every 6 (six) hours as needed. (Patient taking differently: Take 50 mg by mouth every 6 (six) hours as needed for moderate pain or severe pain.) 90 tablet 0   No current facility-administered medications for this visit.    Review of Systems  Constitutional:  Positive for malaise/fatigue.  Respiratory:  Positive for cough.   Cardiovascular:  Negative for chest pain.    PHYSICAL EXAMINATION: BP (!) 140/70   Pulse 61   Resp 20   Ht 5\' 11"  (1.803 m)   Wt 188 lb (85.3 kg)   SpO2 97% Comment: RA  BMI 26.22 kg/m   Physical Exam Constitutional:      General: He is not in acute distress.    Appearance: He is normal weight.  HENT:     Head: Normocephalic and atraumatic.  Eyes:     Extraocular Movements: Extraocular movements intact.  Cardiovascular:     Rate and Rhythm: Normal rate.  Pulmonary:     Effort: Pulmonary effort is normal. No respiratory distress.  Abdominal:     General: Abdomen is flat. There is no distension.  Musculoskeletal:        General: Normal range of motion.  Skin:    General: Skin is warm and dry.  Neurological:     General: No focal deficit present.     Mental Status: He is alert and oriented to person, place, and time.      Diagnostic Studies & Laboratory data:     Recent Radiology Findings:   CT Biopsy  Result Date: 05/22/2022 INDICATION: History of lung cancer, now with nodular hypermetabolic pleural thickening within the left costophrenic angle. Patient presents  for image guided biopsy for tissue diagnostic purposes. EXAM: ULTRASOUND AND CT GUIDED BIOPSY OF LEFT BASILAR PLEURAL THICKENING COMPARISON:  PET-CT-05/13/2022; 02/11/2022 MEDICATIONS: None. ANESTHESIA/SEDATION: Moderate (conscious) sedation was employed during this procedure as  administered by the Interventional Radiology RN. A total of Versed 1 mg and Fentanyl 50 mcg was administered intravenously. Moderate Sedation Time: 15 minutes. The patient's level of consciousness and vital signs were monitored continuously by radiology nursing throughout the procedure under my direct supervision. CONTRAST:  None. COMPLICATIONS: None immediate. PROCEDURE: RADIATION DOSE REDUCTION: This exam was performed according to the departmental dose-optimization program which includes automated exposure control, adjustment of the mA and/or kV according to patient size and/or use of iterative reconstruction technique. Informed consent was obtained from the patient following an explanation of the procedure, risks, benefits and alternatives. A time out was performed prior to the initiation of the procedure. The patient was positioned supine, slightly RPO on the CT table and a limited CT was performed for procedural planning demonstrating chronic left-sided pleural effusion and associated pleural thickening. CT gantry table position was marked and the nodular pleural thickening was identified sonographically, correlating with the dominant hypermetabolic pleural thickening demonstrated on preceding PET-CT image 112, series 604. The procedure was planned. The operative site was prepped and draped in the usual sterile fashion. Under direct ultrasound guidance, a 17 gauge coaxial needle was advanced into the peripheral aspect of the left basilar pleural thickening. Multiple ultrasound images were saved procedural documentation purposes. Appropriate positioning was confirmed with CT imaging (image 17, series 4). Next, under direct ultrasound guidance, 4 core needle biopsy samples were obtained with an 18 gauge core needle biopsy device. The co-axial needle was removed and hemostasis was achieved with manual compression. A limited postprocedural CT was negative for pneumothorax, hemorrhage or additional complication. A  dressing was applied. The patient tolerated the procedure well without immediate postprocedural complication. IMPRESSION: Technically successful ultrasound and CT-guided biopsy of left basilar pleural thickening. Electronically Signed   By: Sandi Mariscal M.D.   On: 05/22/2022 11:06   DG Chest 2 View  Result Date: 05/16/2022 CLINICAL DATA:  76 year old male with cough for 2 weeks EXAM: CHEST - 2 VIEW COMPARISON:  05/04/2021, PET-CT 05/13/2022 FINDINGS: Cardiomediastinal silhouette unchanged in size and contour with the left heart border partially obscured by lung and pleural disease. Persisting opacity at the left lung base with blunting of the costophrenic angle, pleuroparenchymal thickening, obscuration of left hemidiaphragm and the left heart border. Right lung relatively well aerated with interval resolution of the mixed interstitial and airspace disease at the right lung base. No pneumothorax. No displaced fracture.  Degenerative changes of the spine. IMPRESSION: Persisting opacity at the left lung base, better characterized on recent PET-CT, and likely a combination of pleural fluid, possible atelectasis/consolidation, as well as potential malignancy (per PET imaging). Surgical changes of median sternotomy and CABG. Electronically Signed   By: Corrie Mckusick D.O.   On: 05/16/2022 07:53   NM PET Image Restag (PS) Skull Base To Thigh  Result Date: 05/15/2022 CLINICAL DATA:  Subsequent treatment strategy for small cell lung cancer. Oral chemotherapy ongoing. Stage IV lung cancer EXAM: NUCLEAR MEDICINE PET SKULL BASE TO THIGH TECHNIQUE: 9.6 mCi F-18 FDG was injected intravenously. Full-ring PET imaging was performed from the skull base to thigh after the radiotracer. CT data was obtained and used for attenuation correction and anatomic localization. Fasting blood glucose: 116 mg/dl COMPARISON:  PET CT 02/11/2022 FINDINGS: Mediastinal blood pool activity: SUV max 2.3 Liver  activity: SUV max NA NECK: No  hypermetabolic lymph nodes in the neck. Incidental CT findings: None. CHEST: Again demonstrated loculated pleural effusion at the LEFT lung base with the thickened rim. The thickened rim now has intense metabolic activity with SUV max equal 16.1 which is new from prior. There is a new focus of peripheral airspace disease in the RIGHT lower lobe measuring 2.9 cm (image 54/7) with SUV max equal 5.7. Subtle small focus of metabolic peripheral nodularity in the posterior RIGHT lung apex measuring 9 mm on image 58 with SUV max equal 4.5. Incidental CT findings: None. ABDOMEN/PELVIS: Metabolic activity associated with the partially calcified LEFT adrenal metastasis with SUV max equal 6.7 compared SUV max equal 4.8. No changes CT imaging Incidental CT findings: None. SKELETON: Several sclerotic lesions again noted throughout the pelvis and spine. Sclerotic lesion the posterior LEFT iliac bone with SUV max equal 2.7 not changed from SUV max equal 2.1. However, sclerotic lesion in the T10 vertebral body (image 103) is increased with SUV max equal 4.9 compared SUV max equal 2.9. Incidental CT findings: None. IMPRESSION: 1. New metabolic activity associated with the loculated pleural effusion at the LEFT lung base with thickened rim. The thickened rim has new hypermetabolic activity concerning for recurrence of small cell lung cancer. Consider tissue sampling to exclude pulmonary infection. 2. Two foci of nodular airspace disease in the RIGHT lung are indeterminate but could represent lung cancer recurrence versus pulmonary infection. 3. Increased metabolic activity of calcified LEFT adrenal metastasis is also concerning for recurrence although no CT change. 4. Similar mildly metabolic sclerotic skeletal metastasis. One lesion at T12 is increased in metabolic activity. Electronically Signed   By: Suzy Bouchard M.D.   On: 05/15/2022 09:07       I have independently reviewed the above radiology studies  and reviewed the  findings with the patient.   Recent Lab Findings: Lab Results  Component Value Date   WBC 5.9 05/22/2022   HGB 12.2 (L) 05/22/2022   HCT 36.0 (L) 05/22/2022   PLT 242 05/22/2022   GLUCOSE 116 (H) 05/14/2022   CHOL 151 03/03/2022   TRIG 87 03/03/2022   HDL 58 03/03/2022   LDLCALC 77 03/03/2022   ALT 34 05/14/2022   AST 35 05/14/2022   NA 141 05/14/2022   K 4.0 05/14/2022   CL 107 05/14/2022   CREATININE 1.11 05/14/2022   BUN 15 05/14/2022   CO2 25 05/14/2022   TSH 2.248 06/20/2021   INR 1.2 05/22/2022        Assessment / Plan:   76 year old male with history of stage IV adenocarcinoma of the left lung now with pleural thickening and avidity.  This is also in the setting of an upper respiratory infection.  We will need to discuss plans with his oncologist however I am recommending that we at least wait until he is recovered from his infection, and reassess as to whether or not he truly needs a biopsy.      Lajuana Matte 06/01/2022 8:11 AM

## 2022-06-02 ENCOUNTER — Encounter: Payer: Self-pay | Admitting: Hematology & Oncology

## 2022-06-02 ENCOUNTER — Ambulatory Visit (HOSPITAL_COMMUNITY)
Admission: RE | Admit: 2022-06-02 | Discharge: 2022-06-02 | Disposition: A | Discharge status: Home / Self Care | Payer: Medicare Other | Source: Ambulatory Visit | Attending: Hematology & Oncology | Admitting: Hematology & Oncology

## 2022-06-02 ENCOUNTER — Telehealth: Payer: Self-pay

## 2022-06-02 ENCOUNTER — Encounter: Payer: Self-pay | Admitting: *Deleted

## 2022-06-02 ENCOUNTER — Other Ambulatory Visit (HOSPITAL_COMMUNITY): Payer: Self-pay

## 2022-06-02 DIAGNOSIS — C3492 Malignant neoplasm of unspecified part of left bronchus or lung: Secondary | ICD-10-CM | POA: Diagnosis not present

## 2022-06-02 DIAGNOSIS — R410 Disorientation, unspecified: Secondary | ICD-10-CM | POA: Diagnosis not present

## 2022-06-02 MED ORDER — GADOBUTROL 1 MMOL/ML IV SOLN
8.0000 mL | Freq: Once | INTRAVENOUS | Status: AC | PRN
  Administered 2022-06-02: 8 mL via INTRAVENOUS

## 2022-06-02 NOTE — Telephone Encounter (Signed)
Oral Oncology Patient Advocate Encounter   Received notification that patient is due for re-enrollment for assistance for Krazati through [employer].   Re-enrollment process has been initiated and will be submitted upon completion of necessary documents.  [employer]'s phone number [phone].   I will continue to follow until final determination.  Berdine Addison, [facility] Patient [place]  [phone] (phone) [phone] (fax) 06/02/2022 3:49 PM

## 2022-06-02 NOTE — Progress Notes (Signed)
Patient needs a STAT MRI today due to increased confusion and concern for disease progression. MRI scheduled for today with 2:30p arrival time.   Called and spoke with patient's wife, Pamala Hurry. She is aware of patient's appointment including date, time and location.   Oncology Nurse Navigator Documentation     06/02/2022   10:30 AM  Oncology Nurse Navigator Flowsheets  Navigator Follow Up Date: 06/03/2022  Navigator Follow Up Reason: Scan Review  Navigator Location [facility]  Navigator Encounter Type Appt/Treatment Plan Review;Telephone  Telephone Appt Confirmation/Clarification;Education;Outgoing Call  Patient Visit Type MedOnc  Treatment Phase Active Tx  Barriers/Navigation Needs Coordination of Care  Education Other  Interventions Coordination of Care;Education  Acuity Level 2-Minimal Needs (1-2 Barriers Identified)  Coordination of Care Radiology  Education Method Verbal;Teach-back  Support Groups/Services Friends and Family  Time Spent with Patient 30

## 2022-06-03 ENCOUNTER — Other Ambulatory Visit: Payer: Self-pay | Admitting: *Deleted

## 2022-06-03 ENCOUNTER — Encounter: Payer: Self-pay | Admitting: *Deleted

## 2022-06-03 ENCOUNTER — Encounter: Payer: Self-pay | Admitting: Hematology & Oncology

## 2022-06-03 MED ORDER — METHYLPREDNISOLONE 4 MG PO TBPK: ORAL_TABLET | ORAL | 0 refills | Status: DC

## 2022-06-03 NOTE — Progress Notes (Signed)
See MyChart communication dated 06/02/2022.  Oncology Nurse Navigator Documentation     06/03/2022    8:30 AM  Oncology Nurse Navigator Flowsheets  Navigator Follow Up Date: 06/17/2022  Navigator Follow Up Reason: Follow-up Appointment  Navigator Location [facility]  Navigator Encounter Type Scan Review;MyChart  Patient Visit Type MedOnc  Treatment Phase Active Tx  Barriers/Navigation Needs Coordination of Care  Education Other  Interventions Education;Coordination of Care  Acuity Level 2-Minimal Needs (1-2 Barriers Identified)  Coordination of Care Other  Education Method Written  Support Groups/Services Friends and Family  Time Spent with Patient 15

## 2022-06-03 NOTE — Telephone Encounter (Signed)
Left VM for patient to call back to initiate re-enrollment process.   Berdine Addison, [facility] Patient [place]  [phone] (phone) [phone] (fax) 06/03/2022 8:42 AM

## 2022-06-07 ENCOUNTER — Other Ambulatory Visit: Payer: Self-pay | Admitting: Cardiology

## 2022-06-13 ENCOUNTER — Other Ambulatory Visit: Payer: Self-pay | Admitting: Hematology & Oncology

## 2022-06-13 ENCOUNTER — Other Ambulatory Visit: Payer: Self-pay | Admitting: Cardiology

## 2022-06-13 DIAGNOSIS — C3492 Malignant neoplasm of unspecified part of left bronchus or lung: Secondary | ICD-10-CM

## 2022-06-17 ENCOUNTER — Encounter: Payer: Self-pay | Admitting: Hematology & Oncology

## 2022-06-17 ENCOUNTER — Inpatient Hospital Stay (HOSPITAL_BASED_OUTPATIENT_CLINIC_OR_DEPARTMENT_OTHER): Payer: Medicare Other | Admitting: Hematology & Oncology

## 2022-06-17 ENCOUNTER — Inpatient Hospital Stay: Payer: Medicare Other

## 2022-06-17 ENCOUNTER — Encounter: Payer: Self-pay | Admitting: *Deleted

## 2022-06-17 ENCOUNTER — Telehealth: Payer: Self-pay

## 2022-06-17 ENCOUNTER — Inpatient Hospital Stay: Payer: Medicare Other | Admitting: Licensed Clinical Social Worker

## 2022-06-17 VITALS — BP 128/72 | HR 59 | Temp 98.5°F | Resp 20 | Ht 71.0 in | Wt 180.0 lb

## 2022-06-17 DIAGNOSIS — C3492 Malignant neoplasm of unspecified part of left bronchus or lung: Secondary | ICD-10-CM

## 2022-06-17 DIAGNOSIS — E039 Hypothyroidism, unspecified: Secondary | ICD-10-CM

## 2022-06-17 DIAGNOSIS — C7972 Secondary malignant neoplasm of left adrenal gland: Secondary | ICD-10-CM | POA: Diagnosis not present

## 2022-06-17 DIAGNOSIS — C7971 Secondary malignant neoplasm of right adrenal gland: Secondary | ICD-10-CM | POA: Diagnosis not present

## 2022-06-17 DIAGNOSIS — Z79899 Other long term (current) drug therapy: Secondary | ICD-10-CM | POA: Diagnosis not present

## 2022-06-17 DIAGNOSIS — J90 Pleural effusion, not elsewhere classified: Secondary | ICD-10-CM | POA: Diagnosis not present

## 2022-06-17 DIAGNOSIS — C3412 Malignant neoplasm of upper lobe, left bronchus or lung: Secondary | ICD-10-CM | POA: Diagnosis not present

## 2022-06-17 DIAGNOSIS — C7951 Secondary malignant neoplasm of bone: Secondary | ICD-10-CM | POA: Diagnosis not present

## 2022-06-17 DIAGNOSIS — R93811 Abnormal radiologic findings on diagnostic imaging of right testicle: Secondary | ICD-10-CM

## 2022-06-17 LAB — CMP (CANCER CENTER ONLY)
ALT: 25 U/L (ref 0–44)
AST: 20 U/L (ref 15–41)
Albumin: 4.1 g/dL (ref 3.5–5.0)
Alkaline Phosphatase: 55 U/L (ref 38–126)
Anion gap: 8 (ref 5–15)
BUN: 20 mg/dL (ref 8–23)
CO2: 29 mmol/L (ref 22–32)
Calcium: 9.4 mg/dL (ref 8.9–10.3)
Chloride: 105 mmol/L (ref 98–111)
Creatinine: 1.25 mg/dL — ABNORMAL HIGH (ref 0.61–1.24)
GFR, Estimated: >60 mL/min (ref >60)
Glucose, Bld: 95 mg/dL (ref 70–99)
Potassium: 3.8 mmol/L (ref 3.5–5.1)
Sodium: 142 mmol/L (ref 135–145)
Total Bilirubin: 0.5 mg/dL (ref 0.3–1.2)
Total Protein: 6.2 g/dL — ABNORMAL LOW (ref 6.5–8.1)

## 2022-06-17 LAB — CBC WITH DIFFERENTIAL (CANCER CENTER ONLY)
Abs Immature Granulocytes: 0.03 10*3/uL (ref 0.00–0.07)
Basophils Absolute: 0 10*3/uL (ref 0.0–0.1)
Basophils Relative: 0 %
Eosinophils Absolute: 0.1 10*3/uL (ref 0.0–0.5)
Eosinophils Relative: 1 %
HCT: 33.8 % — ABNORMAL LOW (ref 39.0–52.0)
Hemoglobin: 11.1 g/dL — ABNORMAL LOW (ref 13.0–17.0)
Immature Granulocytes: 1 %
Lymphocytes Relative: 8 %
Lymphs Abs: 0.4 10*3/uL — ABNORMAL LOW (ref 0.7–4.0)
MCH: 34.4 pg — ABNORMAL HIGH (ref 26.0–34.0)
MCHC: 32.8 g/dL (ref 30.0–36.0)
MCV: 104.6 fL — ABNORMAL HIGH (ref 80.0–100.0)
Monocytes Absolute: 0.4 10*3/uL (ref 0.1–1.0)
Monocytes Relative: 8 %
Neutro Abs: 3.8 10*3/uL (ref 1.7–7.7)
Neutrophils Relative %: 82 %
Platelet Count: 95 10*3/uL — ABNORMAL LOW (ref 150–400)
RBC: 3.23 MIL/uL — ABNORMAL LOW (ref 4.22–5.81)
RDW: 14.6 % (ref 11.5–15.5)
WBC Count: 4.7 10*3/uL (ref 4.0–10.5)
nRBC: 0 % (ref 0.0–0.2)

## 2022-06-17 LAB — PREALBUMIN: Prealbumin: 27 mg/dL (ref 18–38)

## 2022-06-17 LAB — LACTATE DEHYDROGENASE: LDH: 183 U/L (ref 98–192)

## 2022-06-17 LAB — TSH: TSH: 5.252 u[IU]/mL — ABNORMAL HIGH (ref 0.350–4.500)

## 2022-06-17 MED ORDER — LEVOTHYROXINE SODIUM 50 MCG PO TABS: 50.0000 ug | ORAL_TABLET | Freq: Every day | ORAL | 3 refills | Status: DC

## 2022-06-17 MED ORDER — PREDNISONE 10 MG PO TABS: 10.0000 mg | ORAL_TABLET | Freq: Every day | ORAL | 4 refills | Status: DC

## 2022-06-17 MED ORDER — DENOSUMAB 120 MG/1.7ML ~~LOC~~ SOLN
120.0000 mg | SUBCUTANEOUS | Status: DC
  Administered 2022-06-17: 120 mg via SUBCUTANEOUS

## 2022-06-17 NOTE — Patient Instructions (Signed)
[facility]  Discharge Instructions: Thank you for choosing [facility] to provide your oncology and hematology care.   If you have a lab appointment with the [facility], please go directly to the [facility] and check in at the registration area.  Wear comfortable clothing and clothing appropriate for easy access to any Portacath or PICC line.   We strive to give you quality time with your provider. You may need to reschedule your appointment if you arrive late (15 or more minutes).  Arriving late affects you and other patients whose appointments are after yours.  Also, if you miss three or more appointments without notifying the office, you may be dismissed from the clinic at the provider's discretion.      For prescription refill requests, have your pharmacy contact our office and allow 72 hours for refills to be completed.    Today you received the following chemotherapy and/or immunotherapy agents xgeva       To help prevent nausea and vomiting after your treatment, we encourage you to take your nausea medication as directed.  BELOW ARE SYMPTOMS THAT SHOULD BE REPORTED IMMEDIATELY: *FEVER GREATER THAN 100.4 F (38 C) OR HIGHER *CHILLS OR SWEATING *NAUSEA AND VOMITING THAT IS NOT CONTROLLED WITH YOUR NAUSEA MEDICATION *UNUSUAL SHORTNESS OF BREATH *UNUSUAL BRUISING OR BLEEDING *URINARY PROBLEMS (pain or burning when urinating, or frequent urination) *BOWEL PROBLEMS (unusual diarrhea, constipation, pain near the anus) TENDERNESS IN MOUTH AND THROAT WITH OR WITHOUT PRESENCE OF ULCERS (sore throat, sores in mouth, or a toothache) UNUSUAL RASH, SWELLING OR PAIN  UNUSUAL VAGINAL DISCHARGE OR ITCHING   Items with * indicate a potential emergency and should be followed up as soon as possible or go to the Emergency Department if any problems should occur.  Please show the CHEMOTHERAPY ALERT CARD or IMMUNOTHERAPY ALERT CARD at check-in to the  Emergency Department and triage nurse. Should you have questions after your visit or need to cancel or reschedule your appointment, please contact [facility]  [phone] and follow the prompts.  Office hours are 8:00 a.m. to 4:30 p.m. Monday - Friday. Please note that voicemails left after 4:00 p.m. may not be returned until the following business day.  We are closed weekends and major holidays. You have access to a nurse at all times for urgent questions. Please call the main number to the clinic [phone] and follow the prompts.  For any non-urgent questions, you may also contact your provider using MyChart. We now offer e-Visits for anyone 28 and older to request care online for non-urgent symptoms. For details visit [email or web address].   Also download the MyChart app! Go to the app store, search "MyChart", open the app, select [facility], and log in with your MyChart username and password.  Masks are optional in the cancer centers. If you would like for your care team to wear a mask while they are taking care of you, please let them know. You may have one support person who is at least 75 years old accompany you for your appointments.

## 2022-06-17 NOTE — Addendum Note (Signed)
Addended by: Burney Gauze R on: 06/17/2022 09:20 AM   Modules accepted: Orders

## 2022-06-17 NOTE — Telephone Encounter (Signed)
-----   Message from John Napoleon, MD sent at 06/17/2022  3:30 PM EST ----- Call and let him know that the thyroid is a little bit on the low side.  We need to get him started on Synthroid.  Please call in Synthroid 50 mcg p.o. daily.  Thanks.  Laurey Arrow

## 2022-06-17 NOTE — Progress Notes (Signed)
Patient here for follow up. He is feeling much better than at his last appointment. He will get symptom management while here.   His wife, Pamala Hurry, called me while he was in infusion. She was upset to hear that patient had an MD appointment today, as she had many questions for Dr Marin Olp. I spoke with her and attempted to answer her questions, but she will need to speak to Dr Marin Olp. Message given to him requesting a call.   One of Barbara's concerns was that patient has been more forgetful and if this office is calling with results, information, or scheduling he may not get the message or forget what was said. She asks that her number be the primary number on the chart. This is changed.   Patient will need a PET before his next appointment. Scheduled for   Patient is aware of PET appointment including date, time, and location. The following prep is reviewed with patient and confirmed with teachback: - arrive 30 minutes before appointment time - NPO except water for 6h before scan. No candy, no gum - have a low carb dinner the night prior  Oncology Nurse Navigator Documentation     06/17/2022    8:30 AM  Oncology Nurse Navigator Flowsheets  Navigator Follow Up Date: 07/17/2022  Navigator Follow Up Reason: Scan Review  Navigator Location [facility]  Navigator Encounter Type Treatment;Telephone;Appt/Treatment Plan Review  Telephone Patient Update;Education;Incoming Call  Patient Visit Type MedOnc  Treatment Phase Active Tx  Barriers/Navigation Needs Coordination of Care  Education Pain/ Symptom Management;Other  Interventions Coordination of Care;Education;Psycho-Social Support  Acuity Level 2-Minimal Needs (1-2 Barriers Identified)  Coordination of Care Radiology  Education Method Verbal;Written  Support Groups/Services Friends and Family  Time Spent with Patient 68

## 2022-06-17 NOTE — Telephone Encounter (Signed)
Called and informed pts wife of TSH results and confirmed pharmacy for new medication, pts wife confirmed and declines any other questions or concerns at this time.

## 2022-06-17 NOTE — Progress Notes (Signed)
Hematology and Oncology Follow Up Visit  John Hinton [id number] 03-19-1946 76 y.o. 06/17/2022   Principle Diagnosis:  Stage IVA (T3N2N0) adenocarcinoma of the LUL -- resected -- (+) KRAS -- progression - metastasis to right lung, bilateral adrenal glands, bony metastasis to T1, Y 10 and left iliac bone PE involving the right lower lobe and right middle lobe branches, no right heart strain noted  Past Therapy: S/P cycle 6 of Carbo/Alimta/Pembrolizumab Alimta/pembrolizumab-maintenance therapy s/p cycle 3/4  - start on 11/28/2020 --Alimta discontinued after cycle 2   Current Therapy:        Lumakras 360 mg po q day -- started on 07/31/2021 -- d/c on 02/07/203 for toxicity Xgeva every 3 months - due again 08/2022 Eliquis 5 mg PO BID Adagrasib  (Krazati)200 mg po BID -- start on 09/22/2021   Interim History:  John Hinton is here today for follow-up.  Today, he looks a whole lot better.  I am unsure exactly what happened but he is feeling a whole lot better.  Looks like his old self.  I am not sure if he picked up something when he was over in [place].  He did see Dr. Kipp Brood I have Thoracic Surgery.  Dr. Kipp Brood did not think he needed to a VATS procedure right now.  John Hinton has not had any problems with pain.  He has had no problems with nausea or vomiting.  We did do an MRI of the brain.  This did not show any evidence of metastasis.  Does have he has quality of life is better.  I think what has helped him has been prednisone.  Once he started the prednisone Dosepak, he started to feel better.  As such, I will put him on a low-dose of prednisone at 10 mg a day.  He has had no diarrhea.  He has had no rashes.  He had no bleeding.  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  He is doing well with this.  Overall, I would say his performance status is ECOG 1.    Medications:  Allergies as of 06/17/2022       Reactions   Amlodipine Swelling   Swelling of ankles         Medication List        Accurate as of June 17, 2022  8:38 AM. If you have any questions, ask your nurse or doctor.          STOP taking these medications    methylPREDNISolone 4 MG Tbpk tablet Commonly known as: MEDROL DOSEPAK Stopped by: [name], MD       TAKE these medications    adagrasib 200 MG tablet Commonly known as: KRAZATI Take 2 tablets (400 mg total) by mouth 2 (two) times daily. What changed: how much to take   aspirin 81 MG tablet Take 81 mg by mouth at bedtime.   B-12 PO Take 1,000 mcg by mouth daily.   benzonatate 200 MG capsule Commonly known as: TESSALON Take 1 capsule (200 mg total) by mouth 3 (three) times daily as needed for cough.   cholecalciferol 1000 units tablet Commonly known as: VITAMIN D Take 1,000 Units by mouth at bedtime.   colchicine 0.6 MG tablet Take 1 tablet (0.6 mg total) by mouth every 8 (eight) hours as needed.   diphenoxylate-atropine 2.5-0.025 MG tablet Commonly known as: LOMOTIL TAKE 2 TABLETS BY MOUTH AT ONSET OF DIARRHEA THEN TAKE 1 TABLET AFTER EACH LOOSE STOOL MAX 8 TABLETS  PER DAY   Eliquis 5 MG Tabs tablet Generic drug: apixaban TAKE ONE TABLET BY MOUTH TWICE A DAY   Entresto 24-26 MG Generic drug: sacubitril-valsartan TAKE ONE TABLET BY MOUTH TWICE A DAY   ezetimibe 10 MG tablet Commonly known as: ZETIA Take 1 tablet (10 mg total) by mouth daily.   folic acid 1 MG tablet Commonly known as: FOLVITE TAKE ONE TABLET BY MOUTH DAILY START 5 TO 7 DAYS PRIOR TO CHEMO AND CONTINUE UNTIL 21 DAYS AFTER COMPLETES   HYDROcodone bit-homatropine 5-1.5 MG/5ML syrup Commonly known as: HYCODAN Take 5 mLs by mouth every 6 (six) hours as needed for cough.   metoprolol succinate 25 MG 24 hr tablet Commonly known as: TOPROL-XL Take 1 tablet (25 mg total) by mouth at bedtime.   multivitamin capsule Take 1 capsule by mouth daily.   nitroGLYCERIN 0.4 MG SL tablet Commonly known as:  NITROSTAT Place 1 tablet (0.4 mg total) under the tongue every 5 (five) minutes as needed for chest pain.   oxyCODONE-acetaminophen 7.5-325 MG tablet Commonly known as: Percocet Take 1 tablet by mouth every 4 (four) hours as needed for severe pain.   rosuvastatin 40 MG tablet Commonly known as: CRESTOR TAKE ONE TABLET BY MOUTH EVERY NIGHT AT BEDTIME   sertraline 50 MG tablet Commonly known as: ZOLOFT Take 50 mg by mouth at bedtime.   sertraline 100 MG tablet Commonly known as: ZOLOFT Take 100 mg by mouth every morning.   sucralfate 1 g tablet Commonly known as: Carafate Take 1 tablet (1 g total) by mouth 4 (four) times daily. Dissolve each tablet in 15 cc water before use.   traMADol 50 MG tablet Commonly known as: ULTRAM Take 1 tablet (50 mg total) by mouth every 6 (six) hours as needed.        Allergies:  Allergies  Allergen Reactions   Amlodipine Swelling    Swelling of ankles    Past Medical History, Surgical history, Social history, and Family History were reviewed and updated.  Review of Systems: Review of Systems  Constitutional: Negative.   HENT: Negative.    Eyes: Negative.   Respiratory: Negative.    Cardiovascular: Negative.   Gastrointestinal: Negative.   Genitourinary: Negative.   Musculoskeletal: Negative.   Skin: Negative.   Neurological: Negative.   Endo/Heme/Allergies: Negative.   Psychiatric/Behavioral: Negative.       Physical Exam:  height is _0  (1.803 m) and weight is 180 lb (81.6 kg). His oral temperature is 98.5 F (36.9 C). His blood pressure is 128/72 and his pulse is 59 (abnormal). His respiration is 20 and oxygen saturation is 100%.   Wt Readings from Last 3 Encounters:  06/17/22 180 lb (81.6 kg)  06/01/22 178 lb 6.4 oz (80.9 kg)  05/29/22 188 lb (85.3 kg)    Physical Exam Vitals reviewed.  HENT:     Head: Normocephalic and atraumatic.  Eyes:     Pupils: Pupils are equal, round, and reactive to light.   Cardiovascular:     Rate and Rhythm: Normal rate and regular rhythm.     Heart sounds: Normal heart sounds.  Pulmonary:     Effort: Pulmonary effort is normal.     Breath sounds: Normal breath sounds.  Abdominal:     General: Bowel sounds are normal.     Palpations: Abdomen is soft.  Musculoskeletal:        General: No tenderness or deformity. Normal range of motion.     Cervical back: Normal  range of motion.  Lymphadenopathy:     Cervical: No cervical adenopathy.  Skin:    General: Skin is warm and dry.     Findings: No erythema or rash.  Neurological:     Mental Status: He is alert and oriented to person, place, and time.  Psychiatric:        Behavior: Behavior normal.        Thought Content: Thought content normal.        Judgment: Judgment normal.     Lab Results  Component Value Date   WBC 4.7 06/17/2022   HGB 11.1 (L) 06/17/2022   HCT 33.8 (L) 06/17/2022   MCV 104.6 (H) 06/17/2022   PLT 95 (L) 06/17/2022   Lab Results  Component Value Date   FERRITIN 731 (H) 06/01/2022   IRON 73 06/01/2022   TIBC 311 06/01/2022   UIBC 238 06/01/2022   IRONPCTSAT 24 06/01/2022   Lab Results  Component Value Date   RETICCTPCT 2.7 06/01/2022   RBC 3.23 (L) 06/17/2022   No results found for: "KPAFRELGTCHN", "LAMBDASER", "KAPLAMBRATIO" No results found for: "IGGSERUM", "IGA", "IGMSERUM" No results found for: "TOTALPROTELP", "ALBUMINELP", "A1GS", "A2GS", "BETS", "BETA2SER", "GAMS", "MSPIKE", "SPEI"   Chemistry      Component Value Date/Time   NA 144 06/01/2022 0914   NA 142 06/19/2021 1032   K 4.0 06/01/2022 0914   CL 106 06/01/2022 0914   CO2 31 06/01/2022 0914   BUN 39 (H) 06/01/2022 0914   BUN 12 06/19/2021 1032   CREATININE 1.45 (H) 06/01/2022 0914   CREATININE 1.01 03/26/2016 1009      Component Value Date/Time   CALCIUM 9.8 06/01/2022 0914   ALKPHOS 89 06/01/2022 0914   AST 22 06/01/2022 0914   ALT 21 06/01/2022 0914   BILITOT 0.4 06/01/2022 0914        Impression and Plan: Mr. Brod is a very pleasant 76 yo caucasian male with an atypical stage IV lung cancer which was previously resected followed by systemic chemotherapy and maintenance pembrolizumab.   When he recurred, we put him on Lumakras.  He could not tolerate this because of elevated liver function studies.  We now have him on adagrasib.  I would have to believe that he is doing okay with this.    I will order another PET scan on him.  Will do this at the end of December.  I think this will be incredibly important to see how everything looks with the lungs.  Clearly, he has gotten better.  When he was last here, we gave him some IV fluid and some IV antibiotic.  This did seem to help him.  I am just happy that his quality life is doing better now.  He really wants to be active.  I will see him back in January after he has a PET scan done.    He will get his Delton See today.      [name], MD 11/29/20238:38 AM

## 2022-06-18 ENCOUNTER — Inpatient Hospital Stay: Payer: Medicare Other

## 2022-06-18 NOTE — Progress Notes (Signed)
[facility]  Clinical Social Work was referred by Art therapist for assessment of psychosocial needs.  Clinical Social Worker contacted caregiver by phone  to offer support and assess for needs.    CSW spoke with patient's wife, Pamala Hurry.  She stated they had no needs currently, but know they will need assistance in the future.  She agreed to allow CSW to mail her [place] and business card.  Encouraged her to contact CSW with any questions or concerns.     Margaree Mackintosh, LCSW  Clinical Social Worker [facility]

## 2022-06-27 ENCOUNTER — Other Ambulatory Visit: Payer: Self-pay | Admitting: Hematology & Oncology

## 2022-06-27 DIAGNOSIS — C3492 Malignant neoplasm of unspecified part of left bronchus or lung: Secondary | ICD-10-CM

## 2022-06-30 NOTE — Telephone Encounter (Signed)
Oral Oncology Patient Advocate Encounter  Reached out and spoke with patient regarding PAP paperwork, explained that I would send it to their preferred email via DocuSign.   Confirmed email address: [email or web address].    Patient expressed understanding and consent.  Will follow up once paperwork has been signed and returned.   Berdine Addison, [facility] Patient [place]  [phone] (phone) [phone] (fax) 06/30/2022 8:43 AM

## 2022-07-01 NOTE — Telephone Encounter (Signed)
Received patient signature. Will submit once I have MD signature.   John Hinton, [facility] Patient [place]  [phone] (phone) [phone] (fax) 07/01/2022 8:11 AM

## 2022-07-03 NOTE — Telephone Encounter (Signed)
Oral Oncology Patient Advocate Encounter   Submitted application for assistance for Krazati to [employer].   Application submitted via e-fax to [phone]   [employer]'s phone number [phone].   I will continue to check the status until final determination.   Berdine Addison, [facility] Patient [place]  [phone] (phone) [phone] (fax) 07/03/2022 4:06 PM

## 2022-07-08 ENCOUNTER — Encounter: Payer: Self-pay | Admitting: *Deleted

## 2022-07-16 ENCOUNTER — Other Ambulatory Visit (HOSPITAL_COMMUNITY): Payer: Self-pay

## 2022-07-17 ENCOUNTER — Ambulatory Visit (HOSPITAL_COMMUNITY)
Admission: RE | Admit: 2022-07-17 | Discharge: 2022-07-17 | Disposition: A | Discharge status: Home / Self Care | Payer: Medicare Other | Source: Ambulatory Visit | Attending: Hematology & Oncology | Admitting: Hematology & Oncology

## 2022-07-17 DIAGNOSIS — R937 Abnormal findings on diagnostic imaging of other parts of musculoskeletal system: Secondary | ICD-10-CM | POA: Diagnosis not present

## 2022-07-17 DIAGNOSIS — C3492 Malignant neoplasm of unspecified part of left bronchus or lung: Secondary | ICD-10-CM | POA: Diagnosis not present

## 2022-07-17 DIAGNOSIS — R590 Localized enlarged lymph nodes: Secondary | ICD-10-CM | POA: Insufficient documentation

## 2022-07-17 DIAGNOSIS — J90 Pleural effusion, not elsewhere classified: Secondary | ICD-10-CM | POA: Diagnosis not present

## 2022-07-17 DIAGNOSIS — C3490 Malignant neoplasm of unspecified part of unspecified bronchus or lung: Secondary | ICD-10-CM | POA: Diagnosis not present

## 2022-07-17 LAB — GLUCOSE, CAPILLARY: Glucose-Capillary: 100 mg/dL — ABNORMAL HIGH (ref 70–99)

## 2022-07-17 MED ORDER — FLUDEOXYGLUCOSE F - 18 (FDG) INJECTION
9.2000 | Freq: Once | INTRAVENOUS | Status: AC
  Administered 2022-07-17: 9.1 via INTRAVENOUS

## 2022-07-22 ENCOUNTER — Encounter: Payer: Self-pay | Admitting: *Deleted

## 2022-07-22 NOTE — Progress Notes (Signed)
Reviewed PET which is concerning for progression. Patient is already scheduled for office follow up on Friday.   Oncology Nurse Navigator Documentation     07/22/2022    8:00 AM  Oncology Nurse Navigator Flowsheets  Navigator Follow Up Date: 07/24/2022  Navigator Follow Up Reason: Follow-up Appointment  Navigator Location [facility]  Navigator Encounter Type Scan Review  Patient Visit Type MedOnc  Treatment Phase Active Tx  Barriers/Navigation Needs Coordination of Care  Interventions None Required  Acuity Level 2-Minimal Needs (1-2 Barriers Identified)  Support Groups/Services Friends and Family  Time Spent with Patient 15

## 2022-07-23 DIAGNOSIS — I10 Essential (primary) hypertension: Secondary | ICD-10-CM | POA: Diagnosis not present

## 2022-07-23 DIAGNOSIS — C3412 Malignant neoplasm of upper lobe, left bronchus or lung: Secondary | ICD-10-CM | POA: Diagnosis not present

## 2022-07-23 DIAGNOSIS — Z86711 Personal history of pulmonary embolism: Secondary | ICD-10-CM | POA: Diagnosis not present

## 2022-07-23 DIAGNOSIS — I2510 Atherosclerotic heart disease of native coronary artery without angina pectoris: Secondary | ICD-10-CM | POA: Diagnosis not present

## 2022-07-24 ENCOUNTER — Inpatient Hospital Stay: Payer: Medicare Other

## 2022-07-24 ENCOUNTER — Other Ambulatory Visit: Payer: Self-pay

## 2022-07-24 ENCOUNTER — Encounter: Payer: Self-pay | Admitting: Hematology & Oncology

## 2022-07-24 ENCOUNTER — Encounter: Payer: Self-pay | Admitting: *Deleted

## 2022-07-24 ENCOUNTER — Inpatient Hospital Stay (HOSPITAL_BASED_OUTPATIENT_CLINIC_OR_DEPARTMENT_OTHER): Payer: Medicare Other | Admitting: Hematology & Oncology

## 2022-07-24 ENCOUNTER — Inpatient Hospital Stay: Payer: Medicare Other | Attending: Hematology & Oncology

## 2022-07-24 VITALS — BP 141/69 | HR 63 | Temp 97.7°F | Resp 18 | Ht 71.0 in | Wt 185.0 lb

## 2022-07-24 DIAGNOSIS — I517 Cardiomegaly: Secondary | ICD-10-CM | POA: Diagnosis not present

## 2022-07-24 DIAGNOSIS — C3402 Malignant neoplasm of left main bronchus: Secondary | ICD-10-CM

## 2022-07-24 DIAGNOSIS — R0602 Shortness of breath: Secondary | ICD-10-CM | POA: Insufficient documentation

## 2022-07-24 DIAGNOSIS — C7971 Secondary malignant neoplasm of right adrenal gland: Secondary | ICD-10-CM | POA: Diagnosis not present

## 2022-07-24 DIAGNOSIS — R9431 Abnormal electrocardiogram [ECG] [EKG]: Secondary | ICD-10-CM | POA: Diagnosis not present

## 2022-07-24 DIAGNOSIS — Z79899 Other long term (current) drug therapy: Secondary | ICD-10-CM | POA: Insufficient documentation

## 2022-07-24 DIAGNOSIS — C3492 Malignant neoplasm of unspecified part of left bronchus or lung: Secondary | ICD-10-CM

## 2022-07-24 DIAGNOSIS — C7972 Secondary malignant neoplasm of left adrenal gland: Secondary | ICD-10-CM | POA: Insufficient documentation

## 2022-07-24 DIAGNOSIS — C3412 Malignant neoplasm of upper lobe, left bronchus or lung: Secondary | ICD-10-CM | POA: Diagnosis not present

## 2022-07-24 DIAGNOSIS — C7951 Secondary malignant neoplasm of bone: Secondary | ICD-10-CM | POA: Insufficient documentation

## 2022-07-24 LAB — CMP (CANCER CENTER ONLY)
ALT: 15 U/L (ref 0–44)
AST: 16 U/L (ref 15–41)
Albumin: 4.6 g/dL (ref 3.5–5.0)
Alkaline Phosphatase: 52 U/L (ref 38–126)
Anion gap: 9 (ref 5–15)
BUN: 19 mg/dL (ref 8–23)
CO2: 30 mmol/L (ref 22–32)
Calcium: 9.5 mg/dL (ref 8.9–10.3)
Chloride: 105 mmol/L (ref 98–111)
Creatinine: 1.25 mg/dL — ABNORMAL HIGH (ref 0.61–1.24)
GFR, Estimated: >60 mL/min (ref >60)
Glucose, Bld: 94 mg/dL (ref 70–99)
Potassium: 3.8 mmol/L (ref 3.5–5.1)
Sodium: 144 mmol/L (ref 135–145)
Total Bilirubin: 0.5 mg/dL (ref 0.3–1.2)
Total Protein: 7.2 g/dL (ref 6.5–8.1)

## 2022-07-24 LAB — CBC WITH DIFFERENTIAL (CANCER CENTER ONLY)
Abs Immature Granulocytes: 0.03 10*3/uL (ref 0.00–0.07)
Basophils Absolute: 0 10*3/uL (ref 0.0–0.1)
Basophils Relative: 0 %
Eosinophils Absolute: 0 10*3/uL (ref 0.0–0.5)
Eosinophils Relative: 1 %
HCT: 35.1 % — ABNORMAL LOW (ref 39.0–52.0)
Hemoglobin: 11.6 g/dL — ABNORMAL LOW (ref 13.0–17.0)
Immature Granulocytes: 1 %
Lymphocytes Relative: 9 %
Lymphs Abs: 0.6 10*3/uL — ABNORMAL LOW (ref 0.7–4.0)
MCH: 34.2 pg — ABNORMAL HIGH (ref 26.0–34.0)
MCHC: 33 g/dL (ref 30.0–36.0)
MCV: 103.5 fL — ABNORMAL HIGH (ref 80.0–100.0)
Monocytes Absolute: 0.5 10*3/uL (ref 0.1–1.0)
Monocytes Relative: 8 %
Neutro Abs: 5.3 10*3/uL (ref 1.7–7.7)
Neutrophils Relative %: 81 %
Platelet Count: 132 10*3/uL — ABNORMAL LOW (ref 150–400)
RBC: 3.39 MIL/uL — ABNORMAL LOW (ref 4.22–5.81)
RDW: 15.6 % — ABNORMAL HIGH (ref 11.5–15.5)
WBC Count: 6.4 10*3/uL (ref 4.0–10.5)
nRBC: 0 % (ref 0.0–0.2)

## 2022-07-24 LAB — LACTATE DEHYDROGENASE: LDH: 155 U/L (ref 98–192)

## 2022-07-24 NOTE — Progress Notes (Signed)
Hematology and Oncology Follow Up Visit  John Hinton [id number] 1945-07-22 77 y.o. 07/24/2022   Principle Diagnosis:  Stage IVA (T3N2N0) adenocarcinoma of the LUL -- resected -- (+) KRAS -- progression - metastasis to right lung, bilateral adrenal glands, bony metastasis to T1, Y 10 and left iliac bone PE involving the right lower lobe and right middle lobe branches, no right heart strain noted  Past Therapy: S/P cycle 6 of Carbo/Alimta/Pembrolizumab Alimta/pembrolizumab-maintenance therapy s/p cycle 3/4  - start on 11/28/2020 --Alimta discontinued after cycle 2   Current Therapy:        Lumakras 360 mg po q day -- started on 07/31/2021 -- d/c on 02/07/203 for toxicity Xgeva every 3 months - due again 08/2022 Eliquis 5 mg PO BID Adagrasib  (Krazati)400 mg po BID -- changed on 07/25/2022 Nivolumab 480mg  IV q month -- start on 07/29/2022   Interim History:  John Hinton is here today for follow-up.  Unfortunately, looks like we probably do have progressive disease.  He had a PET scan that was done on 07/17/2022.  This showed increased metabolic activity in the right hilum.  He has stable activity of the pleural involving the left lower lobe.  He had intense metabolic activity at T1 and T10.  Again, I really think that he is having some progressive disease.  Is been on the Trumann for about 10 months.  He has done quite well with this.  I think that we can increase the Krazati to 40 mg p.o. twice daily.  Hopefully, he will be able to tolerate this dose.  Think another good option would be to add immunotherapy.  I know that there have been some studies that have shown the advantage of having immunotherapy along with the KRAS inhibitor.  Hopefully, this will be approved by insurance.  He really has had no problems with cough.  There is no pain issues.  He did have a nice Christmas and New Year's holiday.  As always, he and the family will be going to [place], I think in  February.  His appetite has been good.  He has had no diarrhea.  He has had no nausea or vomiting.  There is been no leg swelling.  He continues on Eliquis.  He is doing well on Eliquis without any evidence of bleeding.  Overall, his performance status has been ECOG 1.    Medications:  Allergies as of 07/24/2022       Reactions   Amlodipine Swelling   Swelling of ankles        Medication List        Accurate as of July 24, 2022  8:12 AM. If you have any questions, ask your nurse or doctor.          adagrasib 200 MG tablet Commonly known as: KRAZATI Take 2 tablets (400 mg total) by mouth 2 (two) times daily. What changed: how much to take   aspirin 81 MG tablet Take 81 mg by mouth at bedtime.   B-12 PO Take 1,000 mcg by mouth daily.   benzonatate 200 MG capsule Commonly known as: TESSALON Take 1 capsule (200 mg total) by mouth 3 (three) times daily as needed for cough.   cholecalciferol 1000 units tablet Commonly known as: VITAMIN D Take 1,000 Units by mouth at bedtime.   colchicine 0.6 MG tablet Take 1 tablet (0.6 mg total) by mouth every 8 (eight) hours as needed.   diphenoxylate-atropine 2.5-0.025 MG tablet Commonly known as: LOMOTIL TAKE 2  TABLETS BY MOUTH AT ONSET OF DIARRHEA THEN TAKE 1 TABLET AFTER EACH LOOSE STOOL MAX 8 TABLETS PER DAY   Eliquis 5 MG Tabs tablet Generic drug: apixaban TAKE 1 TABLET BY MOUTH TWICE A DAY   Entresto 24-26 MG Generic drug: sacubitril-valsartan TAKE ONE TABLET BY MOUTH TWICE A DAY   ezetimibe 10 MG tablet Commonly known as: ZETIA Take 1 tablet (10 mg total) by mouth daily.   folic acid 1 MG tablet Commonly known as: FOLVITE TAKE ONE TABLET BY MOUTH DAILY START 5 TO 7 DAYS PRIOR TO CHEMO AND CONTINUE UNTIL 21 DAYS AFTER COMPLETES   HYDROcodone bit-homatropine 5-1.5 MG/5ML syrup Commonly known as: HYCODAN Take 5 mLs by mouth every 6 (six) hours as needed for cough.   levothyroxine 50 MCG tablet Commonly  known as: Synthroid Take 1 tablet (50 mcg total) by mouth daily before breakfast.   metoprolol succinate 25 MG 24 hr tablet Commonly known as: TOPROL-XL Take 1 tablet (25 mg total) by mouth at bedtime.   multivitamin capsule Take 1 capsule by mouth daily.   nitroGLYCERIN 0.4 MG SL tablet Commonly known as: NITROSTAT Place 1 tablet (0.4 mg total) under the tongue every 5 (five) minutes as needed for chest pain.   oxyCODONE-acetaminophen 7.5-325 MG tablet Commonly known as: Percocet Take 1 tablet by mouth every 4 (four) hours as needed for severe pain.   predniSONE 10 MG tablet Commonly known as: DELTASONE Take 1 tablet (10 mg total) by mouth daily with breakfast.   rosuvastatin 40 MG tablet Commonly known as: CRESTOR TAKE ONE TABLET BY MOUTH EVERY NIGHT AT BEDTIME   sertraline 50 MG tablet Commonly known as: ZOLOFT Take 50 mg by mouth at bedtime.   sertraline 100 MG tablet Commonly known as: ZOLOFT Take 100 mg by mouth every morning.   sucralfate 1 g tablet Commonly known as: Carafate Take 1 tablet (1 g total) by mouth 4 (four) times daily. Dissolve each tablet in 15 cc water before use.   traMADol 50 MG tablet Commonly known as: ULTRAM Take 1 tablet (50 mg total) by mouth every 6 (six) hours as needed.        Allergies:  Allergies  Allergen Reactions   Amlodipine Swelling    Swelling of ankles    Past Medical History, Surgical history, Social history, and Family History were reviewed and updated.  Review of Systems: Review of Systems  Constitutional: Negative.   HENT: Negative.    Eyes: Negative.   Respiratory: Negative.    Cardiovascular: Negative.   Gastrointestinal: Negative.   Genitourinary: Negative.   Musculoskeletal: Negative.   Skin: Negative.   Neurological: Negative.   Endo/Heme/Allergies: Negative.   Psychiatric/Behavioral: Negative.       Physical Exam:  vitals were not taken for this visit.   Wt Readings from Last 3 Encounters:   06/17/22 180 lb (81.6 kg)  06/01/22 178 lb 6.4 oz (80.9 kg)  05/29/22 188 lb (85.3 kg)    Physical Exam Vitals reviewed.  HENT:     Head: Normocephalic and atraumatic.  Eyes:     Pupils: Pupils are equal, round, and reactive to light.  Cardiovascular:     Rate and Rhythm: Normal rate and regular rhythm.     Heart sounds: Normal heart sounds.  Pulmonary:     Effort: Pulmonary effort is normal.     Breath sounds: Normal breath sounds.  Abdominal:     General: Bowel sounds are normal.     Palpations: Abdomen is  soft.  Musculoskeletal:        General: No tenderness or deformity. Normal range of motion.     Cervical back: Normal range of motion.  Lymphadenopathy:     Cervical: No cervical adenopathy.  Skin:    General: Skin is warm and dry.     Findings: No erythema or rash.  Neurological:     Mental Status: He is alert and oriented to person, place, and time.  Psychiatric:        Behavior: Behavior normal.        Thought Content: Thought content normal.        Judgment: Judgment normal.    Lab Results  Component Value Date   WBC 4.7 06/17/2022   HGB 11.1 (L) 06/17/2022   HCT 33.8 (L) 06/17/2022   MCV 104.6 (H) 06/17/2022   PLT 95 (L) 06/17/2022   Lab Results  Component Value Date   FERRITIN 731 (H) 06/01/2022   IRON 73 06/01/2022   TIBC 311 06/01/2022   UIBC 238 06/01/2022   IRONPCTSAT 24 06/01/2022   Lab Results  Component Value Date   RETICCTPCT 2.7 06/01/2022   RBC 3.23 (L) 06/17/2022   No results found for: "KPAFRELGTCHN", "LAMBDASER", "KAPLAMBRATIO" No results found for: "IGGSERUM", "IGA", "IGMSERUM" No results found for: "TOTALPROTELP", "ALBUMINELP", "A1GS", "A2GS", "BETS", "BETA2SER", "GAMS", "MSPIKE", "SPEI"   Chemistry      Component Value Date/Time   NA 142 06/17/2022 0758   NA 142 06/19/2021 1032   K 3.8 06/17/2022 0758   CL 105 06/17/2022 0758   CO2 29 06/17/2022 0758   BUN 20 06/17/2022 0758   BUN 12 06/19/2021 1032   CREATININE 1.25  (H) 06/17/2022 0758   CREATININE 1.01 03/26/2016 1009      Component Value Date/Time   CALCIUM 9.4 06/17/2022 0758   ALKPHOS 55 06/17/2022 0758   AST 20 06/17/2022 0758   ALT 25 06/17/2022 0758   BILITOT 0.5 06/17/2022 0758       Impression and Plan: Mr. Sortor is a very pleasant 77 yo caucasian male with an atypical stage IV lung cancer which was previously resected followed by systemic chemotherapy and maintenance pembrolizumab.   When he recurred, we put him on Lumakras.  He could not tolerate this because of elevated liver function studies.  We now have him on adagrasib.  We will put him up to full dose adagrasib and hopefully we will see a response.  Again, I will try to add immunotherapy.  I think this to be very reasonable.  I think this would help his quality of life.  This is very important for him.  I would like to try to start the immunotherapy next week.  I would then would probably see him back in about 2 weeks or so.  I do think we have to follow him closely.  Again, I want to try to maintain his quality of life.  I think by using the adagrasib with immunotherapy, we can help his quality of life and hopefully cause some cancer regression.     [name], MD 1/5/20248:12 AM

## 2022-07-26 ENCOUNTER — Other Ambulatory Visit: Payer: Self-pay

## 2022-07-27 NOTE — Progress Notes (Unsigned)
Pharmacist Chemotherapy Monitoring - Initial Assessment    Anticipated start date: 07/30/22   The following has been reviewed per standard work regarding the patient's treatment regimen: The patient's diagnosis, treatment plan and drug doses, and organ/hematologic function Lab orders and baseline tests specific to treatment regimen  The treatment plan start date, drug sequencing, and pre-medications Prior authorization status  Patient's documented medication list, including drug-drug interaction screen and prescriptions for anti-emetics and supportive care specific to the treatment regimen The drug concentrations, fluid compatibility, administration routes, and timing of the medications to be used The patient's access for treatment and lifetime cumulative dose history, if applicable  The patient's medication allergies and previous infusion related reactions, if applicable   Changes made to treatment plan:  N/A  Follow up needed:  Pending authorization for treatment    Claybon Jabs, [facility], 07/27/2022  9:46 AM

## 2022-07-28 ENCOUNTER — Encounter: Payer: Self-pay | Admitting: Hematology & Oncology

## 2022-07-28 ENCOUNTER — Other Ambulatory Visit: Payer: Self-pay

## 2022-07-28 NOTE — Progress Notes (Signed)
Patient's PET earlier this week shows progressive disease. Plan to increase Krazati to full dose and add Nivolumab. Will start next week.   Oncology Nurse Navigator Documentation     07/24/2022    1:45 PM  Oncology Nurse Navigator Flowsheets  Navigator Follow Up Date: 08/17/2022  Navigator Follow Up Reason: Follow-up Appointment  Navigator Location [facility]  Navigator Encounter Type Appt/Treatment Plan Review  Patient Visit Type MedOnc  Treatment Phase Active Tx  Barriers/Navigation Needs Coordination of Care  Interventions None Required  Acuity Level 2-Minimal Needs (1-2 Barriers Identified)  Support Groups/Services Friends and Family  Time Spent with Patient 15

## 2022-07-30 ENCOUNTER — Inpatient Hospital Stay: Payer: Medicare Other

## 2022-07-30 VITALS — BP 141/72 | HR 63 | Temp 97.7°F

## 2022-07-30 DIAGNOSIS — C7951 Secondary malignant neoplasm of bone: Secondary | ICD-10-CM | POA: Diagnosis not present

## 2022-07-30 DIAGNOSIS — C7971 Secondary malignant neoplasm of right adrenal gland: Secondary | ICD-10-CM | POA: Diagnosis not present

## 2022-07-30 DIAGNOSIS — C3412 Malignant neoplasm of upper lobe, left bronchus or lung: Secondary | ICD-10-CM | POA: Diagnosis not present

## 2022-07-30 DIAGNOSIS — R0602 Shortness of breath: Secondary | ICD-10-CM | POA: Diagnosis not present

## 2022-07-30 DIAGNOSIS — I517 Cardiomegaly: Secondary | ICD-10-CM | POA: Diagnosis not present

## 2022-07-30 DIAGNOSIS — C3402 Malignant neoplasm of left main bronchus: Secondary | ICD-10-CM

## 2022-07-30 DIAGNOSIS — C7972 Secondary malignant neoplasm of left adrenal gland: Secondary | ICD-10-CM | POA: Diagnosis not present

## 2022-07-30 MED ORDER — SODIUM CHLORIDE 0.9 % IV SOLN: Freq: Once | INTRAVENOUS | Status: AC

## 2022-07-30 MED ORDER — SODIUM CHLORIDE 0.9 % IV SOLN
480.0000 mg | Freq: Once | INTRAVENOUS | Status: AC
  Administered 2022-07-30: 480 mg via INTRAVENOUS
  Filled 2022-07-30: qty 48

## 2022-08-05 ENCOUNTER — Other Ambulatory Visit: Payer: Self-pay

## 2022-08-05 DIAGNOSIS — I10 Essential (primary) hypertension: Secondary | ICD-10-CM | POA: Diagnosis not present

## 2022-08-05 DIAGNOSIS — F4323 Adjustment disorder with mixed anxiety and depressed mood: Secondary | ICD-10-CM | POA: Diagnosis not present

## 2022-08-05 DIAGNOSIS — F40228 Other natural environment type phobia: Secondary | ICD-10-CM | POA: Diagnosis not present

## 2022-08-05 DIAGNOSIS — E7800 Pure hypercholesterolemia, unspecified: Secondary | ICD-10-CM | POA: Diagnosis not present

## 2022-08-05 DIAGNOSIS — N401 Enlarged prostate with lower urinary tract symptoms: Secondary | ICD-10-CM | POA: Diagnosis not present

## 2022-08-05 DIAGNOSIS — I5020 Unspecified systolic (congestive) heart failure: Secondary | ICD-10-CM | POA: Diagnosis not present

## 2022-08-05 DIAGNOSIS — F411 Generalized anxiety disorder: Secondary | ICD-10-CM | POA: Diagnosis not present

## 2022-08-07 ENCOUNTER — Ambulatory Visit (HOSPITAL_COMMUNITY)
Admission: RE | Admit: 2022-08-07 | Discharge: 2022-08-07 | Disposition: A | Discharge status: Home / Self Care | Payer: Medicare Other | Source: Ambulatory Visit | Attending: Hematology & Oncology | Admitting: Hematology & Oncology

## 2022-08-07 DIAGNOSIS — R001 Bradycardia, unspecified: Secondary | ICD-10-CM | POA: Diagnosis not present

## 2022-08-07 DIAGNOSIS — Z0189 Encounter for other specified special examinations: Secondary | ICD-10-CM | POA: Diagnosis not present

## 2022-08-07 DIAGNOSIS — R0600 Dyspnea, unspecified: Secondary | ICD-10-CM | POA: Diagnosis not present

## 2022-08-07 DIAGNOSIS — R9431 Abnormal electrocardiogram [ECG] [EKG]: Secondary | ICD-10-CM | POA: Diagnosis not present

## 2022-08-07 DIAGNOSIS — I2510 Atherosclerotic heart disease of native coronary artery without angina pectoris: Secondary | ICD-10-CM | POA: Diagnosis not present

## 2022-08-07 DIAGNOSIS — H2513 Age-related nuclear cataract, bilateral: Secondary | ICD-10-CM | POA: Diagnosis not present

## 2022-08-07 DIAGNOSIS — G4730 Sleep apnea, unspecified: Secondary | ICD-10-CM | POA: Insufficient documentation

## 2022-08-07 DIAGNOSIS — E785 Hyperlipidemia, unspecified: Secondary | ICD-10-CM | POA: Insufficient documentation

## 2022-08-07 DIAGNOSIS — C3402 Malignant neoplasm of left main bronchus: Secondary | ICD-10-CM | POA: Insufficient documentation

## 2022-08-07 NOTE — Progress Notes (Signed)
Echocardiogram 2D Echocardiogram has been performed.  John Hinton 08/07/2022, 11:57 AM

## 2022-08-08 LAB — ECHOCARDIOGRAM COMPLETE
Calc EF: 51.6 %
Est EF: 50
S' Lateral: 4.6 cm
Single Plane A2C EF: 50.3 %
Single Plane A4C EF: 54.3 %

## 2022-08-09 ENCOUNTER — Encounter: Payer: Self-pay | Admitting: Hematology & Oncology

## 2022-08-10 ENCOUNTER — Ambulatory Visit (HOSPITAL_BASED_OUTPATIENT_CLINIC_OR_DEPARTMENT_OTHER)
Admission: RE | Admit: 2022-08-10 | Discharge: 2022-08-10 | Disposition: A | Discharge status: Home / Self Care | Payer: Medicare Other | Source: Ambulatory Visit | Attending: Hematology & Oncology | Admitting: Hematology & Oncology

## 2022-08-10 ENCOUNTER — Encounter: Payer: Self-pay | Admitting: Hematology & Oncology

## 2022-08-10 ENCOUNTER — Encounter: Payer: Self-pay | Admitting: *Deleted

## 2022-08-10 ENCOUNTER — Other Ambulatory Visit: Payer: Self-pay

## 2022-08-10 ENCOUNTER — Inpatient Hospital Stay (HOSPITAL_BASED_OUTPATIENT_CLINIC_OR_DEPARTMENT_OTHER): Payer: Medicare Other | Admitting: Hematology & Oncology

## 2022-08-10 ENCOUNTER — Inpatient Hospital Stay: Payer: Medicare Other

## 2022-08-10 ENCOUNTER — Encounter (HOSPITAL_BASED_OUTPATIENT_CLINIC_OR_DEPARTMENT_OTHER): Payer: Self-pay

## 2022-08-10 VITALS — BP 122/55 | HR 57 | Temp 98.3°F | Resp 18 | Ht 71.0 in | Wt 191.1 lb

## 2022-08-10 DIAGNOSIS — C3492 Malignant neoplasm of unspecified part of left bronchus or lung: Secondary | ICD-10-CM

## 2022-08-10 DIAGNOSIS — R4182 Altered mental status, unspecified: Secondary | ICD-10-CM | POA: Diagnosis not present

## 2022-08-10 DIAGNOSIS — C3402 Malignant neoplasm of left main bronchus: Secondary | ICD-10-CM

## 2022-08-10 DIAGNOSIS — I672 Cerebral atherosclerosis: Secondary | ICD-10-CM | POA: Diagnosis not present

## 2022-08-10 DIAGNOSIS — Z85118 Personal history of other malignant neoplasm of bronchus and lung: Secondary | ICD-10-CM | POA: Diagnosis not present

## 2022-08-10 LAB — CBC WITH DIFFERENTIAL (CANCER CENTER ONLY)
Abs Immature Granulocytes: 0.02 10*3/uL (ref 0.00–0.07)
Basophils Absolute: 0 10*3/uL (ref 0.0–0.1)
Basophils Relative: 0 %
Eosinophils Absolute: 0 10*3/uL (ref 0.0–0.5)
Eosinophils Relative: 0 %
HCT: 36.5 % — ABNORMAL LOW (ref 39.0–52.0)
Hemoglobin: 11.6 g/dL — ABNORMAL LOW (ref 13.0–17.0)
Immature Granulocytes: 0 %
Lymphocytes Relative: 8 %
Lymphs Abs: 0.5 10*3/uL — ABNORMAL LOW (ref 0.7–4.0)
MCH: 33 pg (ref 26.0–34.0)
MCHC: 31.8 g/dL (ref 30.0–36.0)
MCV: 104 fL — ABNORMAL HIGH (ref 80.0–100.0)
Monocytes Absolute: 0.6 10*3/uL (ref 0.1–1.0)
Monocytes Relative: 8 %
Neutro Abs: 6 10*3/uL (ref 1.7–7.7)
Neutrophils Relative %: 84 %
Platelet Count: 132 10*3/uL — ABNORMAL LOW (ref 150–400)
RBC: 3.51 MIL/uL — ABNORMAL LOW (ref 4.22–5.81)
RDW: 15.4 % (ref 11.5–15.5)
WBC Count: 7.2 10*3/uL (ref 4.0–10.5)
nRBC: 0 % (ref 0.0–0.2)

## 2022-08-10 LAB — CMP (CANCER CENTER ONLY)
ALT: 20 U/L (ref 0–44)
AST: 20 U/L (ref 15–41)
Albumin: 4.8 g/dL (ref 3.5–5.0)
Alkaline Phosphatase: 51 U/L (ref 38–126)
Anion gap: 11 (ref 5–15)
BUN: 20 mg/dL (ref 8–23)
CO2: 28 mmol/L (ref 22–32)
Calcium: 9.4 mg/dL (ref 8.9–10.3)
Chloride: 107 mmol/L (ref 98–111)
Creatinine: 1.38 mg/dL — ABNORMAL HIGH (ref 0.61–1.24)
GFR, Estimated: 54 mL/min — ABNORMAL LOW (ref >60)
Glucose, Bld: 134 mg/dL — ABNORMAL HIGH (ref 70–99)
Potassium: 3.9 mmol/L (ref 3.5–5.1)
Sodium: 146 mmol/L — ABNORMAL HIGH (ref 135–145)
Total Bilirubin: 0.5 mg/dL (ref 0.3–1.2)
Total Protein: 7.2 g/dL (ref 6.5–8.1)

## 2022-08-10 LAB — LACTATE DEHYDROGENASE: LDH: 177 U/L (ref 98–192)

## 2022-08-10 LAB — MAGNESIUM: Magnesium: 2.7 mg/dL — ABNORMAL HIGH (ref 1.7–2.4)

## 2022-08-10 MED ORDER — IOHEXOL 300 MG/ML  SOLN
100.0000 mL | Freq: Once | INTRAMUSCULAR | Status: AC | PRN
  Administered 2022-08-10: 80 mL via INTRAVENOUS
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
## 2022-08-10 NOTE — Telephone Encounter (Signed)
Called to check status of application. Informed by representative that Benefits Investigation had just completed. All required information is in hand and was sent to Case Manager for Final Review/ Processing marled as urgent. I will continue to follow and update until final determination.   Ardeen Fillers, CPhT Oncology Pharmacy Patient Advocate  [facility]  [phone] (phone) [phone] (fax) 08/10/2022 1:52 PM

## 2022-08-11 ENCOUNTER — Ambulatory Visit (HOSPITAL_COMMUNITY)
Admission: RE | Admit: 2022-08-11 | Discharge: 2022-08-11 | Disposition: A | Discharge status: Home / Self Care | Payer: Medicare Other | Source: Ambulatory Visit | Attending: Hematology & Oncology | Admitting: Hematology & Oncology

## 2022-08-11 DIAGNOSIS — C3490 Malignant neoplasm of unspecified part of unspecified bronchus or lung: Secondary | ICD-10-CM | POA: Diagnosis not present

## 2022-08-11 DIAGNOSIS — C3492 Malignant neoplasm of unspecified part of left bronchus or lung: Secondary | ICD-10-CM | POA: Diagnosis not present

## 2022-08-11 MED ORDER — GADOBUTROL 1 MMOL/ML IV SOLN
8.5000 mL | Freq: Once | INTRAVENOUS | Status: AC | PRN
  Administered 2022-08-11: 8.5 mL via INTRAVENOUS

## 2022-08-12 ENCOUNTER — Encounter: Payer: Self-pay | Admitting: Hematology & Oncology

## 2022-08-12 ENCOUNTER — Encounter: Payer: Self-pay | Admitting: *Deleted

## 2022-08-13 NOTE — Telephone Encounter (Signed)
Received notification that Letter of Hardship was needed to continue re-enrollment due to income of patient. I have received a signed letter of hardship from MD and faxed to [phone]. I will continue to follow and update until final determination.   Berdine Addison, [facility] [place]  [phone] (phone) [phone] (fax) 08/13/2022 12:40 PM

## 2022-08-17 ENCOUNTER — Inpatient Hospital Stay: Payer: Medicare Other

## 2022-08-17 ENCOUNTER — Other Ambulatory Visit (HOSPITAL_COMMUNITY): Payer: Self-pay

## 2022-08-17 ENCOUNTER — Encounter: Payer: Self-pay | Admitting: *Deleted

## 2022-08-17 ENCOUNTER — Other Ambulatory Visit: Payer: Self-pay

## 2022-08-17 ENCOUNTER — Inpatient Hospital Stay (HOSPITAL_BASED_OUTPATIENT_CLINIC_OR_DEPARTMENT_OTHER): Payer: Medicare Other | Admitting: Hematology & Oncology

## 2022-08-17 ENCOUNTER — Ambulatory Visit (HOSPITAL_BASED_OUTPATIENT_CLINIC_OR_DEPARTMENT_OTHER)
Admission: RE | Admit: 2022-08-17 | Discharge: 2022-08-17 | Disposition: A | Discharge status: Home / Self Care | Payer: Medicare Other | Source: Ambulatory Visit | Attending: Hematology & Oncology | Admitting: Hematology & Oncology

## 2022-08-17 VITALS — BP 103/54 | HR 58 | Temp 98.4°F | Resp 20 | Ht 71.0 in | Wt 185.0 lb

## 2022-08-17 DIAGNOSIS — T451X5A Adverse effect of antineoplastic and immunosuppressive drugs, initial encounter: Secondary | ICD-10-CM | POA: Diagnosis present

## 2022-08-17 DIAGNOSIS — C3402 Malignant neoplasm of left main bronchus: Secondary | ICD-10-CM

## 2022-08-17 DIAGNOSIS — C7971 Secondary malignant neoplasm of right adrenal gland: Secondary | ICD-10-CM | POA: Diagnosis not present

## 2022-08-17 DIAGNOSIS — Z86718 Personal history of other venous thrombosis and embolism: Secondary | ICD-10-CM | POA: Diagnosis not present

## 2022-08-17 DIAGNOSIS — C3412 Malignant neoplasm of upper lobe, left bronchus or lung: Secondary | ICD-10-CM | POA: Diagnosis not present

## 2022-08-17 DIAGNOSIS — C3492 Malignant neoplasm of unspecified part of left bronchus or lung: Secondary | ICD-10-CM | POA: Insufficient documentation

## 2022-08-17 DIAGNOSIS — Z818 Family history of other mental and behavioral disorders: Secondary | ICD-10-CM | POA: Diagnosis not present

## 2022-08-17 DIAGNOSIS — Z951 Presence of aortocoronary bypass graft: Secondary | ICD-10-CM | POA: Diagnosis not present

## 2022-08-17 DIAGNOSIS — Z801 Family history of malignant neoplasm of trachea, bronchus and lung: Secondary | ICD-10-CM | POA: Diagnosis not present

## 2022-08-17 DIAGNOSIS — C7801 Secondary malignant neoplasm of right lung: Secondary | ICD-10-CM | POA: Diagnosis present

## 2022-08-17 DIAGNOSIS — R270 Ataxia, unspecified: Secondary | ICD-10-CM | POA: Diagnosis not present

## 2022-08-17 DIAGNOSIS — R93811 Abnormal radiologic findings on diagnostic imaging of right testicle: Secondary | ICD-10-CM

## 2022-08-17 DIAGNOSIS — C7972 Secondary malignant neoplasm of left adrenal gland: Secondary | ICD-10-CM | POA: Diagnosis not present

## 2022-08-17 DIAGNOSIS — R262 Difficulty in walking, not elsewhere classified: Secondary | ICD-10-CM | POA: Diagnosis not present

## 2022-08-17 DIAGNOSIS — Z7901 Long term (current) use of anticoagulants: Secondary | ICD-10-CM | POA: Diagnosis not present

## 2022-08-17 DIAGNOSIS — R260 Ataxic gait: Secondary | ICD-10-CM | POA: Diagnosis present

## 2022-08-17 DIAGNOSIS — Z79899 Other long term (current) drug therapy: Secondary | ICD-10-CM | POA: Diagnosis not present

## 2022-08-17 DIAGNOSIS — R001 Bradycardia, unspecified: Secondary | ICD-10-CM | POA: Diagnosis not present

## 2022-08-17 DIAGNOSIS — R0602 Shortness of breath: Secondary | ICD-10-CM | POA: Diagnosis not present

## 2022-08-17 DIAGNOSIS — I420 Dilated cardiomyopathy: Secondary | ICD-10-CM | POA: Diagnosis not present

## 2022-08-17 DIAGNOSIS — R531 Weakness: Secondary | ICD-10-CM | POA: Diagnosis not present

## 2022-08-17 DIAGNOSIS — C7951 Secondary malignant neoplasm of bone: Secondary | ICD-10-CM | POA: Diagnosis not present

## 2022-08-17 DIAGNOSIS — Z87891 Personal history of nicotine dependence: Secondary | ICD-10-CM | POA: Diagnosis not present

## 2022-08-17 DIAGNOSIS — G130 Paraneoplastic neuromyopathy and neuropathy: Secondary | ICD-10-CM | POA: Diagnosis present

## 2022-08-17 DIAGNOSIS — I2510 Atherosclerotic heart disease of native coronary artery without angina pectoris: Secondary | ICD-10-CM | POA: Diagnosis present

## 2022-08-17 DIAGNOSIS — Z8249 Family history of ischemic heart disease and other diseases of the circulatory system: Secondary | ICD-10-CM | POA: Diagnosis not present

## 2022-08-17 DIAGNOSIS — D72828 Other elevated white blood cell count: Secondary | ICD-10-CM | POA: Diagnosis not present

## 2022-08-17 DIAGNOSIS — I10 Essential (primary) hypertension: Secondary | ICD-10-CM | POA: Diagnosis not present

## 2022-08-17 DIAGNOSIS — F0390 Unspecified dementia without behavioral disturbance: Secondary | ICD-10-CM | POA: Diagnosis present

## 2022-08-17 DIAGNOSIS — Z86711 Personal history of pulmonary embolism: Secondary | ICD-10-CM | POA: Diagnosis not present

## 2022-08-17 DIAGNOSIS — C3490 Malignant neoplasm of unspecified part of unspecified bronchus or lung: Secondary | ICD-10-CM | POA: Diagnosis not present

## 2022-08-17 DIAGNOSIS — Z955 Presence of coronary angioplasty implant and graft: Secondary | ICD-10-CM | POA: Diagnosis not present

## 2022-08-17 DIAGNOSIS — R836 Abnormal cytological findings in cerebrospinal fluid: Secondary | ICD-10-CM | POA: Diagnosis not present

## 2022-08-17 DIAGNOSIS — J90 Pleural effusion, not elsewhere classified: Secondary | ICD-10-CM | POA: Diagnosis not present

## 2022-08-17 DIAGNOSIS — G4489 Other headache syndrome: Secondary | ICD-10-CM | POA: Diagnosis not present

## 2022-08-17 DIAGNOSIS — E785 Hyperlipidemia, unspecified: Secondary | ICD-10-CM | POA: Diagnosis present

## 2022-08-17 DIAGNOSIS — T380X5A Adverse effect of glucocorticoids and synthetic analogues, initial encounter: Secondary | ICD-10-CM | POA: Diagnosis not present

## 2022-08-17 DIAGNOSIS — N400 Enlarged prostate without lower urinary tract symptoms: Secondary | ICD-10-CM | POA: Diagnosis not present

## 2022-08-17 DIAGNOSIS — G4733 Obstructive sleep apnea (adult) (pediatric): Secondary | ICD-10-CM | POA: Diagnosis present

## 2022-08-17 LAB — CMP (CANCER CENTER ONLY)
ALT: 17 U/L (ref 0–44)
AST: 18 U/L (ref 15–41)
Albumin: 4.4 g/dL (ref 3.5–5.0)
Alkaline Phosphatase: 38 U/L (ref 38–126)
Anion gap: 10 (ref 5–15)
BUN: 18 mg/dL (ref 8–23)
CO2: 28 mmol/L (ref 22–32)
Calcium: 9.2 mg/dL (ref 8.9–10.3)
Chloride: 109 mmol/L (ref 98–111)
Creatinine: 1.37 mg/dL — ABNORMAL HIGH (ref 0.61–1.24)
GFR, Estimated: 54 mL/min — ABNORMAL LOW (ref >60)
Glucose, Bld: 103 mg/dL — ABNORMAL HIGH (ref 70–99)
Potassium: 3.7 mmol/L (ref 3.5–5.1)
Sodium: 147 mmol/L — ABNORMAL HIGH (ref 135–145)
Total Bilirubin: 0.5 mg/dL (ref 0.3–1.2)
Total Protein: 6.8 g/dL (ref 6.5–8.1)

## 2022-08-17 LAB — CBC WITH DIFFERENTIAL (CANCER CENTER ONLY)
Abs Immature Granulocytes: 0.03 10*3/uL (ref 0.00–0.07)
Basophils Absolute: 0 10*3/uL (ref 0.0–0.1)
Basophils Relative: 0 %
Eosinophils Absolute: 0 10*3/uL (ref 0.0–0.5)
Eosinophils Relative: 0 %
HCT: 35.7 % — ABNORMAL LOW (ref 39.0–52.0)
Hemoglobin: 11.5 g/dL — ABNORMAL LOW (ref 13.0–17.0)
Immature Granulocytes: 1 %
Lymphocytes Relative: 7 %
Lymphs Abs: 0.4 10*3/uL — ABNORMAL LOW (ref 0.7–4.0)
MCH: 32.9 pg (ref 26.0–34.0)
MCHC: 32.2 g/dL (ref 30.0–36.0)
MCV: 102 fL — ABNORMAL HIGH (ref 80.0–100.0)
Monocytes Absolute: 0.4 10*3/uL (ref 0.1–1.0)
Monocytes Relative: 7 %
Neutro Abs: 4.9 10*3/uL (ref 1.7–7.7)
Neutrophils Relative %: 85 %
Platelet Count: 106 10*3/uL — ABNORMAL LOW (ref 150–400)
RBC: 3.5 MIL/uL — ABNORMAL LOW (ref 4.22–5.81)
RDW: 14.8 % (ref 11.5–15.5)
WBC Count: 5.7 10*3/uL (ref 4.0–10.5)
nRBC: 0 % (ref 0.0–0.2)

## 2022-08-17 LAB — TSH: TSH: 5.227 u[IU]/mL — ABNORMAL HIGH (ref 0.350–4.500)

## 2022-08-17 LAB — LACTATE DEHYDROGENASE: LDH: 171 U/L (ref 98–192)

## 2022-08-17 NOTE — Progress Notes (Signed)
Patient continues to have periods of confusion. Nothing on MRI explains symptoms. There is nothing on his physical assessment that lends to cause either. Dr Marin Olp spoke to Dr Tat, in Neurology, and referral was made. He has been scheduled for this Friday.   Oncology Nurse Navigator Documentation     08/17/2022   11:45 AM  Oncology Nurse Navigator Flowsheets  Navigator Follow Up Date: 08/21/2022  Navigator Follow Up Reason: Review Note  Navigator Location [facility]  Navigator Encounter Type Appt/Treatment Plan Review  Patient Visit Type MedOnc  Treatment Phase Active Tx  Barriers/Navigation Needs Coordination of Care  Interventions Referrals  Acuity Level 2-Minimal Needs (1-2 Barriers Identified)  Referrals Other  Support Groups/Services Friends and Family  Time Spent with Patient 15

## 2022-08-17 NOTE — Progress Notes (Signed)
Hematology and Oncology Follow Up Visit  John Hinton [id number] [birth year] 77 y.o. 08/17/2022   Principle Diagnosis:  Stage IVA (T3N2N0) adenocarcinoma of the LUL -- resected -- (+) KRAS -- progression - metastasis to right lung, bilateral adrenal glands, bony metastasis to T1, Y 10 and left iliac bone PE involving the right lower lobe and right middle lobe branches, no right heart strain noted  Past Therapy: S/P cycle 6 of Carbo/Alimta/Pembrolizumab Alimta/pembrolizumab-maintenance therapy s/p cycle 3/4  - start on 11/28/2020 --Alimta discontinued after cycle 2   Current Therapy:        Lumakras 360 mg po q day -- started on 07/31/2021 -- d/c on 02/07/203 for toxicity Xgeva every 3 months - due again 08/2022 Eliquis 5 mg PO BID Adagrasib  (Krazati)400 mg po BID -- changed on 07/25/2022 Nivolumab 480mg  IV q month -- start on 07/29/2022 --hold starting 08/17/2022   Interim History:  John Hinton is here today for follow-up.  Unfortunate, it still seems like he is not getting better.  He still has neurological problems.  His wife says that she is surprised that he is not being admitted.  He said he had a hard time even getting up today.  He comes in in a wheelchair.  We did do an MRI on him.  This was done a week ago.  The MRI of the brain really did not show [place] anything.  I thought that for sure we would see something with the brain with respect to metastatic disease.  His labs do not look all that bad.  Again he has had 1 dose of immunotherapy.  I know that there is the rare patient that may have immunotherapy neurological issues.  I would think if he would have had encephalitis, that we would be seeing something on the MRI.  He is also short of breath.  I did do a chest x-ray on him.  The chest x-ray really did not show much.  He has stable cardiac enlargement.  There is no real significant effusions.  There is no infiltrate.  He does have cardiac issues.  He had an  echocardiogram a week ago.  This showed an ejection fraction of 50%.  I thought that maybe stopping his metoprolol may not be a bad idea.  He does have a relatively low blood pressure.  He is on Entresto.  Maybe, the metoprolol is causing some beta blockade and his heart cannot increase it activity when he exerts himself.  He seems to be eating okay.  He has had no nausea or vomiting.  He has had no problems with bowels or bladder.  He has had no rashes.  There has been no leg swelling.  He has had no fever.  He is still on the [place].  I think he is doing okay with the [place].  I noted that his TSH was up slightly.  I am not sure this really is a problem for him.  I do not see anything that looks like hypothyroidism.  Overall, I would have to say that his performance status is probably ECOG 2. .    Medications:  Allergies as of 08/17/2022       Reactions   Amlodipine Swelling   Swelling of ankles        Medication List        Accurate as of August 17, 2022 10:15 AM. If you have any questions, ask your nurse or doctor.  STOP taking these medications    benzonatate 200 MG capsule Commonly known as: TESSALON Stopped by: [name], MD       TAKE these medications    adagrasib 200 MG tablet Commonly known as: KRAZATI Take 2 tablets (400 mg total) by mouth 2 (two) times daily. What changed: how much to take   aspirin 81 MG tablet Take 81 mg by mouth at bedtime.   B-12 PO Take 1,000 mcg by mouth daily.   cholecalciferol 1000 units tablet Commonly known as: VITAMIN D Take 1,000 Units by mouth at bedtime.   colchicine 0.6 MG tablet Take 1 tablet (0.6 mg total) by mouth every 8 (eight) hours as needed.   diphenoxylate-atropine 2.5-0.025 MG tablet Commonly known as: LOMOTIL TAKE 2 TABLETS BY MOUTH AT ONSET OF DIARRHEA THEN TAKE 1 TABLET AFTER EACH LOOSE STOOL MAX 8 TABLETS PER DAY   Eliquis 5 MG Tabs tablet Generic drug: apixaban TAKE 1  TABLET BY MOUTH TWICE A DAY   Entresto 24-26 MG Generic drug: sacubitril-valsartan TAKE ONE TABLET BY MOUTH TWICE A DAY   ezetimibe 10 MG tablet Commonly known as: ZETIA Take 1 tablet (10 mg total) by mouth daily.   folic acid 1 MG tablet Commonly known as: FOLVITE TAKE ONE TABLET BY MOUTH DAILY START 5 TO 7 DAYS PRIOR TO CHEMO AND CONTINUE UNTIL 21 DAYS AFTER COMPLETES   HYDROcodone bit-homatropine 5-1.5 MG/5ML syrup Commonly known as: HYCODAN Take 5 mLs by mouth every 6 (six) hours as needed for cough.   levothyroxine 50 MCG tablet Commonly known as: Synthroid Take 1 tablet (50 mcg total) by mouth daily before breakfast.   metoprolol succinate 25 MG 24 hr tablet Commonly known as: TOPROL-XL Take 1 tablet (25 mg total) by mouth at bedtime.   multivitamin capsule Take 1 capsule by mouth daily.   nitroGLYCERIN 0.4 MG SL tablet Commonly known as: NITROSTAT Place 1 tablet (0.4 mg total) under the tongue every 5 (five) minutes as needed for chest pain.   oxyCODONE-acetaminophen 7.5-325 MG tablet Commonly known as: Percocet Take 1 tablet by mouth every 4 (four) hours as needed for severe pain.   predniSONE 10 MG tablet Commonly known as: DELTASONE Take 1 tablet (10 mg total) by mouth daily with breakfast.   rosuvastatin 40 MG tablet Commonly known as: CRESTOR TAKE ONE TABLET BY MOUTH EVERY NIGHT AT BEDTIME   sertraline 100 MG tablet Commonly known as: ZOLOFT Take 100 mg by mouth 2 (two) times daily. What changed: Another medication with the same name was removed. Continue taking this medication, and follow the directions you see here. Changed by: [name], MD   sucralfate 1 g tablet Commonly known as: Carafate Take 1 tablet (1 g total) by mouth 4 (four) times daily. Dissolve each tablet in 15 cc water before use.   traMADol 50 MG tablet Commonly known as: ULTRAM Take 1 tablet (50 mg total) by mouth every 6 (six) hours as needed.        Allergies:   Allergies  Allergen Reactions   Amlodipine Swelling    Swelling of ankles    Past Medical History, Surgical history, Social history, and Family History were reviewed and updated.  Review of Systems: Review of Systems  Constitutional: Negative.   HENT: Negative.    Eyes: Negative.   Respiratory: Negative.    Cardiovascular: Negative.   Gastrointestinal: Negative.   Genitourinary: Negative.   Musculoskeletal: Negative.   Skin: Negative.   Neurological: Negative.  Endo/Heme/Allergies: Negative.   Psychiatric/Behavioral: Negative.       Physical Exam:  height is 5\' 11"  (1.803 m) and weight is 185 lb (83.9 kg). His oral temperature is 98.4 F (36.9 C). His blood pressure is 103/54 (abnormal) and his pulse is 58 (abnormal). His respiration is 20 and oxygen saturation is 100%.   Wt Readings from Last 3 Encounters:  08/17/22 185 lb (83.9 kg)  08/10/22 191 lb 1.9 oz (86.7 kg)  07/24/22 185 lb (83.9 kg)    Physical Exam Vitals reviewed.  HENT:     Head: Normocephalic and atraumatic.  Eyes:     Pupils: Pupils are equal, round, and reactive to light.  Cardiovascular:     Rate and Rhythm: Normal rate and regular rhythm.     Heart sounds: Normal heart sounds.  Pulmonary:     Effort: Pulmonary effort is normal.     Breath sounds: Normal breath sounds.  Abdominal:     General: Bowel sounds are normal.     Palpations: Abdomen is soft.  Musculoskeletal:        General: No tenderness or deformity. Normal range of motion.     Cervical back: Normal range of motion.  Lymphadenopathy:     Cervical: No cervical adenopathy.  Skin:    General: Skin is warm and dry.     Findings: No erythema or rash.  Neurological:     Mental Status: He is alert and oriented to person, place, and time.     Comments: Neurological exam does show some slight neurological deficit with respect to cerebellar function.  Psychiatric:        Behavior: Behavior normal.        Thought Content: Thought  content normal.        Judgment: Judgment normal.    Lab Results  Component Value Date   WBC 5.7 08/17/2022   HGB 11.5 (L) 08/17/2022   HCT 35.7 (L) 08/17/2022   MCV 102.0 (H) 08/17/2022   PLT 106 (L) 08/17/2022   Lab Results  Component Value Date   FERRITIN 731 (H) 06/01/2022   IRON 73 06/01/2022   TIBC 311 06/01/2022   UIBC 238 06/01/2022   IRONPCTSAT 24 06/01/2022   Lab Results  Component Value Date   RETICCTPCT 2.7 06/01/2022   RBC 3.50 (L) 08/17/2022   No results found for: "KPAFRELGTCHN", "LAMBDASER", "KAPLAMBRATIO" No results found for: "IGGSERUM", "IGA", "IGMSERUM" No results found for: "TOTALPROTELP", "ALBUMINELP", "A1GS", "A2GS", "BETS", "BETA2SER", "GAMS", "MSPIKE", "SPEI"   Chemistry      Component Value Date/Time   NA 147 (H) 08/17/2022 0842   NA 142 06/19/2021 1032   K 3.7 08/17/2022 0842   CL 109 08/17/2022 0842   CO2 28 08/17/2022 0842   BUN 18 08/17/2022 0842   BUN 12 06/19/2021 1032   CREATININE 1.37 (H) 08/17/2022 0842   CREATININE 1.01 03/26/2016 1009      Component Value Date/Time   CALCIUM 9.2 08/17/2022 0842   ALKPHOS 38 08/17/2022 0842   AST 18 08/17/2022 0842   ALT 17 08/17/2022 0842   BILITOT 0.5 08/17/2022 0842       Impression and Plan: Mr. Mijangos is a very pleasant 77 yo caucasian male with an atypical stage IV lung cancer which was previously resected followed by systemic chemotherapy and maintenance pembrolizumab.   When he recurred, we put him on Lumakras.  He could not tolerate this because of elevated liver function studies.  We now have him on adagrasib.  We have him on full dose.  I think the 1 possibility that we could be looking at is a paraneoplastic process.  I know that with adenocarcinoma, there could be a paraneoplastic neurological syndrome.  I think the only way to identify this would be with a lumbar puncture.  I think that if we got neurology to see him, they certainly could initiate this.  I suppose we  could also see about getting blood work.  I know that it could take several days before we could tell if there are any antibodies related to a neurologic paraneoplastic syndrome.  I will plan to get Mr. Levinson back in couple weeks or so for follow-up.  Again, we are looking at quality of life as the primary focus here.  Marland Kitchen   [name], MD 1/29/202410:15 AM

## 2022-08-18 ENCOUNTER — Encounter: Payer: Self-pay | Admitting: Hematology & Oncology

## 2022-08-18 LAB — T4: T4, Total: 4.1 ug/dL — ABNORMAL LOW (ref 4.5–12.0)

## 2022-08-19 ENCOUNTER — Emergency Department (HOSPITAL_COMMUNITY): Payer: Medicare Other

## 2022-08-19 ENCOUNTER — Other Ambulatory Visit: Payer: Self-pay

## 2022-08-19 ENCOUNTER — Inpatient Hospital Stay (HOSPITAL_COMMUNITY)
Admission: EM | Admit: 2022-08-19 | Discharge: 2022-08-25 | DRG: 092 | Disposition: A | Discharge status: Home / Self Care | Payer: Medicare Other | Attending: Family Medicine | Admitting: Family Medicine

## 2022-08-19 ENCOUNTER — Encounter: Payer: Self-pay | Admitting: *Deleted

## 2022-08-19 ENCOUNTER — Encounter (HOSPITAL_COMMUNITY): Payer: Self-pay

## 2022-08-19 DIAGNOSIS — C3492 Malignant neoplasm of unspecified part of left bronchus or lung: Secondary | ICD-10-CM

## 2022-08-19 DIAGNOSIS — J90 Pleural effusion, not elsewhere classified: Secondary | ICD-10-CM | POA: Diagnosis not present

## 2022-08-19 DIAGNOSIS — R260 Ataxic gait: Principal | ICD-10-CM | POA: Diagnosis present

## 2022-08-19 DIAGNOSIS — Z8249 Family history of ischemic heart disease and other diseases of the circulatory system: Secondary | ICD-10-CM

## 2022-08-19 DIAGNOSIS — Z955 Presence of coronary angioplasty implant and graft: Secondary | ICD-10-CM

## 2022-08-19 DIAGNOSIS — R001 Bradycardia, unspecified: Secondary | ICD-10-CM | POA: Diagnosis not present

## 2022-08-19 DIAGNOSIS — Z7982 Long term (current) use of aspirin: Secondary | ICD-10-CM

## 2022-08-19 DIAGNOSIS — Z801 Family history of malignant neoplasm of trachea, bronchus and lung: Secondary | ICD-10-CM

## 2022-08-19 DIAGNOSIS — Z951 Presence of aortocoronary bypass graft: Secondary | ICD-10-CM

## 2022-08-19 DIAGNOSIS — C7801 Secondary malignant neoplasm of right lung: Secondary | ICD-10-CM | POA: Diagnosis present

## 2022-08-19 DIAGNOSIS — Z818 Family history of other mental and behavioral disorders: Secondary | ICD-10-CM

## 2022-08-19 DIAGNOSIS — T380X5A Adverse effect of glucocorticoids and synthetic analogues, initial encounter: Secondary | ICD-10-CM | POA: Diagnosis not present

## 2022-08-19 DIAGNOSIS — T451X5A Adverse effect of antineoplastic and immunosuppressive drugs, initial encounter: Secondary | ICD-10-CM | POA: Diagnosis present

## 2022-08-19 DIAGNOSIS — R531 Weakness: Secondary | ICD-10-CM | POA: Diagnosis present

## 2022-08-19 DIAGNOSIS — N400 Enlarged prostate without lower urinary tract symptoms: Secondary | ICD-10-CM | POA: Diagnosis present

## 2022-08-19 DIAGNOSIS — G4489 Other headache syndrome: Secondary | ICD-10-CM | POA: Diagnosis not present

## 2022-08-19 DIAGNOSIS — Z7901 Long term (current) use of anticoagulants: Secondary | ICD-10-CM

## 2022-08-19 DIAGNOSIS — R270 Ataxia, unspecified: Secondary | ICD-10-CM | POA: Diagnosis not present

## 2022-08-19 DIAGNOSIS — I10 Essential (primary) hypertension: Secondary | ICD-10-CM | POA: Diagnosis present

## 2022-08-19 DIAGNOSIS — R292 Abnormal reflex: Secondary | ICD-10-CM | POA: Diagnosis present

## 2022-08-19 DIAGNOSIS — R836 Abnormal cytological findings in cerebrospinal fluid: Secondary | ICD-10-CM | POA: Diagnosis not present

## 2022-08-19 DIAGNOSIS — C3490 Malignant neoplasm of unspecified part of unspecified bronchus or lung: Secondary | ICD-10-CM

## 2022-08-19 DIAGNOSIS — I420 Dilated cardiomyopathy: Secondary | ICD-10-CM | POA: Diagnosis not present

## 2022-08-19 DIAGNOSIS — R0602 Shortness of breath: Secondary | ICD-10-CM | POA: Diagnosis not present

## 2022-08-19 DIAGNOSIS — Z87891 Personal history of nicotine dependence: Secondary | ICD-10-CM

## 2022-08-19 DIAGNOSIS — I2510 Atherosclerotic heart disease of native coronary artery without angina pectoris: Secondary | ICD-10-CM | POA: Diagnosis present

## 2022-08-19 DIAGNOSIS — G4733 Obstructive sleep apnea (adult) (pediatric): Secondary | ICD-10-CM | POA: Diagnosis present

## 2022-08-19 DIAGNOSIS — Z86711 Personal history of pulmonary embolism: Secondary | ICD-10-CM

## 2022-08-19 DIAGNOSIS — Z79899 Other long term (current) drug therapy: Secondary | ICD-10-CM

## 2022-08-19 DIAGNOSIS — R262 Difficulty in walking, not elsewhere classified: Secondary | ICD-10-CM | POA: Diagnosis not present

## 2022-08-19 DIAGNOSIS — C7971 Secondary malignant neoplasm of right adrenal gland: Secondary | ICD-10-CM | POA: Diagnosis present

## 2022-08-19 DIAGNOSIS — C7951 Secondary malignant neoplasm of bone: Secondary | ICD-10-CM | POA: Diagnosis present

## 2022-08-19 DIAGNOSIS — Z79890 Hormone replacement therapy (postmenopausal): Secondary | ICD-10-CM

## 2022-08-19 DIAGNOSIS — F0390 Unspecified dementia without behavioral disturbance: Secondary | ICD-10-CM | POA: Diagnosis present

## 2022-08-19 DIAGNOSIS — Z85118 Personal history of other malignant neoplasm of bronchus and lung: Secondary | ICD-10-CM

## 2022-08-19 DIAGNOSIS — C3411 Malignant neoplasm of upper lobe, right bronchus or lung: Secondary | ICD-10-CM

## 2022-08-19 DIAGNOSIS — E785 Hyperlipidemia, unspecified: Secondary | ICD-10-CM | POA: Diagnosis present

## 2022-08-19 DIAGNOSIS — G130 Paraneoplastic neuromyopathy and neuropathy: Secondary | ICD-10-CM | POA: Diagnosis present

## 2022-08-19 DIAGNOSIS — D72828 Other elevated white blood cell count: Secondary | ICD-10-CM | POA: Diagnosis not present

## 2022-08-19 LAB — URINALYSIS, ROUTINE W REFLEX MICROSCOPIC
Bacteria, UA: NONE SEEN
Bilirubin Urine: NEGATIVE
Glucose, UA: NEGATIVE mg/dL
Ketones, ur: NEGATIVE mg/dL
Leukocytes,Ua: NEGATIVE
Nitrite: NEGATIVE
Protein, ur: NEGATIVE mg/dL
Specific Gravity, Urine: 1.019 (ref 1.005–1.030)
pH: 6 (ref 5.0–8.0)

## 2022-08-19 LAB — CBC WITH DIFFERENTIAL/PLATELET
Abs Immature Granulocytes: 0.02 10*3/uL (ref 0.00–0.07)
Basophils Absolute: 0 10*3/uL (ref 0.0–0.1)
Basophils Relative: 0 %
Eosinophils Absolute: 0 10*3/uL (ref 0.0–0.5)
Eosinophils Relative: 0 %
HCT: 35 % — ABNORMAL LOW (ref 39.0–52.0)
Hemoglobin: 11.6 g/dL — ABNORMAL LOW (ref 13.0–17.0)
Immature Granulocytes: 0 %
Lymphocytes Relative: 6 %
Lymphs Abs: 0.3 10*3/uL — ABNORMAL LOW (ref 0.7–4.0)
MCH: 33.4 pg (ref 26.0–34.0)
MCHC: 33.1 g/dL (ref 30.0–36.0)
MCV: 100.9 fL — ABNORMAL HIGH (ref 80.0–100.0)
Monocytes Absolute: 0.4 10*3/uL (ref 0.1–1.0)
Monocytes Relative: 7 %
Neutro Abs: 5.1 10*3/uL (ref 1.7–7.7)
Neutrophils Relative %: 87 %
Platelets: 112 10*3/uL — ABNORMAL LOW (ref 150–400)
RBC: 3.47 MIL/uL — ABNORMAL LOW (ref 4.22–5.81)
RDW: 14.8 % (ref 11.5–15.5)
WBC: 5.9 10*3/uL (ref 4.0–10.5)
nRBC: 0 % (ref 0.0–0.2)

## 2022-08-19 LAB — VITAMIN B12: Vitamin B-12: 794 pg/mL (ref 180–914)

## 2022-08-19 LAB — COMPREHENSIVE METABOLIC PANEL
ALT: 20 U/L (ref 0–44)
AST: 21 U/L (ref 15–41)
Albumin: 4 g/dL (ref 3.5–5.0)
Alkaline Phosphatase: 40 U/L (ref 38–126)
Anion gap: 8 (ref 5–15)
BUN: 21 mg/dL (ref 8–23)
CO2: 27 mmol/L (ref 22–32)
Calcium: 8.8 mg/dL — ABNORMAL LOW (ref 8.9–10.3)
Chloride: 108 mmol/L (ref 98–111)
Creatinine, Ser: 1.21 mg/dL (ref 0.61–1.24)
GFR, Estimated: >60 mL/min (ref >60)
Glucose, Bld: 115 mg/dL — ABNORMAL HIGH (ref 70–99)
Potassium: 3.1 mmol/L — ABNORMAL LOW (ref 3.5–5.1)
Sodium: 143 mmol/L (ref 135–145)
Total Bilirubin: 0.7 mg/dL (ref 0.3–1.2)
Total Protein: 6.7 g/dL (ref 6.5–8.1)

## 2022-08-19 LAB — PHOSPHORUS: Phosphorus: 3.3 mg/dL (ref 2.5–4.6)

## 2022-08-19 LAB — TROPONIN I (HIGH SENSITIVITY): Troponin I (High Sensitivity): 38 ng/L — ABNORMAL HIGH (ref <18)

## 2022-08-19 LAB — MAGNESIUM: Magnesium: 2.6 mg/dL — ABNORMAL HIGH (ref 1.7–2.4)

## 2022-08-19 LAB — TSH: TSH: 2.954 u[IU]/mL (ref 0.350–4.500)

## 2022-08-19 MED ORDER — TRAMADOL HCL 50 MG PO TABS: 50.0000 mg | ORAL_TABLET | Freq: Four times a day (QID) | ORAL | Status: DC | PRN

## 2022-08-19 MED ORDER — SERTRALINE HCL 100 MG PO TABS
100.0000 mg | ORAL_TABLET | Freq: Two times a day (BID) | ORAL | Status: DC
  Administered 2022-08-19 – 2022-08-25 (×12): 100 mg via ORAL
  Filled 2022-08-19 (×12): qty 1

## 2022-08-19 MED ORDER — OXYCODONE-ACETAMINOPHEN 7.5-325 MG PO TABS: 1.0000 | ORAL_TABLET | ORAL | Status: DC | PRN

## 2022-08-19 MED ORDER — FOLIC ACID 1 MG PO TABS
1.0000 mg | ORAL_TABLET | Freq: Every day | ORAL | Status: DC
  Administered 2022-08-19 – 2022-08-25 (×7): 1 mg via ORAL
  Filled 2022-08-19 (×7): qty 1

## 2022-08-19 MED ORDER — LIDOCAINE HCL 2 % IJ SOLN
10.0000 mL | Freq: Once | INTRAMUSCULAR | Status: AC
  Administered 2022-08-19: 200 mg
  Filled 2022-08-19: qty 20

## 2022-08-19 MED ORDER — ADULT MULTIVITAMIN W/MINERALS CH
1.0000 | ORAL_TABLET | Freq: Every day | ORAL | Status: DC
  Administered 2022-08-20 – 2022-08-25 (×6): 1 via ORAL
  Filled 2022-08-19 (×6): qty 1

## 2022-08-19 MED ORDER — ASPIRIN 81 MG PO TBEC
81.0000 mg | DELAYED_RELEASE_TABLET | Freq: Every day | ORAL | Status: DC
  Administered 2022-08-19: 81 mg via ORAL
  Filled 2022-08-19: qty 1

## 2022-08-19 MED ORDER — SUCRALFATE 1 G PO TABS: 1.0000 g | ORAL_TABLET | Freq: Four times a day (QID) | ORAL | Status: DC | PRN

## 2022-08-19 MED ORDER — ROSUVASTATIN CALCIUM 20 MG PO TABS
40.0000 mg | ORAL_TABLET | Freq: Every day | ORAL | Status: DC
  Administered 2022-08-19 – 2022-08-24 (×6): 40 mg via ORAL
  Filled 2022-08-19 (×6): qty 2

## 2022-08-19 MED ORDER — ACETAMINOPHEN 325 MG PO TABS
650.0000 mg | ORAL_TABLET | Freq: Four times a day (QID) | ORAL | Status: DC | PRN
  Administered 2022-08-23 – 2022-08-24 (×2): 650 mg via ORAL
  Filled 2022-08-19 (×2): qty 2

## 2022-08-19 MED ORDER — ENOXAPARIN SODIUM 40 MG/0.4ML IJ SOSY
40.0000 mg | PREFILLED_SYRINGE | INTRAMUSCULAR | Status: DC
  Administered 2022-08-20: 40 mg via SUBCUTANEOUS
  Filled 2022-08-19 (×2): qty 0.4

## 2022-08-19 MED ORDER — ADAGRASIB 200 MG PO TABS
400.0000 mg | ORAL_TABLET | Freq: Two times a day (BID) | ORAL | Status: DC
  Administered 2022-08-19: 400 mg via ORAL
  Filled 2022-08-19 (×2): qty 2

## 2022-08-19 MED ORDER — PREDNISONE 10 MG PO TABS
10.0000 mg | ORAL_TABLET | Freq: Every day | ORAL | Status: DC
  Administered 2022-08-20 – 2022-08-21 (×2): 10 mg via ORAL
  Filled 2022-08-19 (×2): qty 1

## 2022-08-19 MED ORDER — ACETAMINOPHEN 650 MG RE SUPP: 650.0000 mg | Freq: Four times a day (QID) | RECTAL | Status: DC | PRN

## 2022-08-19 MED ORDER — POTASSIUM CHLORIDE CRYS ER 20 MEQ PO TBCR
40.0000 meq | EXTENDED_RELEASE_TABLET | Freq: Once | ORAL | Status: AC
  Administered 2022-08-19: 40 meq via ORAL
  Filled 2022-08-19: qty 2

## 2022-08-19 MED ORDER — SACUBITRIL-VALSARTAN 24-26 MG PO TABS
1.0000 | ORAL_TABLET | Freq: Two times a day (BID) | ORAL | Status: DC
  Administered 2022-08-20 – 2022-08-25 (×11): 1 via ORAL
  Filled 2022-08-19 (×13): qty 1

## 2022-08-19 MED ORDER — LEVOTHYROXINE SODIUM 50 MCG PO TABS
50.0000 ug | ORAL_TABLET | Freq: Every day | ORAL | Status: DC
  Administered 2022-08-20 – 2022-08-25 (×6): 50 ug via ORAL
  Filled 2022-08-19 (×6): qty 1

## 2022-08-19 MED ORDER — ONDANSETRON HCL 4 MG PO TABS: 4.0000 mg | ORAL_TABLET | Freq: Four times a day (QID) | ORAL | Status: DC | PRN

## 2022-08-19 MED ORDER — ONDANSETRON HCL 4 MG/2ML IJ SOLN: 4.0000 mg | Freq: Four times a day (QID) | INTRAMUSCULAR | Status: DC | PRN

## 2022-08-19 NOTE — Progress Notes (Signed)
Please see MyChart Communication dated 08/12/2022.  Called and spoke to Justin. She called EMS and at the time we spoke he was on route to [facility] via ambulance. Notified Dr Marin Olp.   Will follow for admission.  Oncology Nurse Navigator Documentation     08/19/2022    1:15 PM  Oncology Nurse Navigator Flowsheets  Navigator Follow Up Date: 08/25/2022  Navigator Follow Up Reason: Appointment Review  Navigator Location [facility]  Navigator Encounter Type Telephone;MyChart  Telephone Outgoing Call  Patient Visit Type MedOnc  Treatment Phase Active Tx  Barriers/Navigation Needs Coordination of Care  Interventions Psycho-Social Support;Education  Acuity Level 2-Minimal Needs (1-2 Barriers Identified)  Education Method Written  Support Groups/Services Friends and Family  Time Spent with Patient 30

## 2022-08-19 NOTE — Assessment & Plan Note (Addendum)
Continue entresto, crestor. Cont ASA 81 for the moment. Metoprolol on hold by onc. Echo last week = EF 50%
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
## 2022-08-19 NOTE — Assessment & Plan Note (Signed)
Continue Entresto Metoprolol on hold.

## 2022-08-19 NOTE — Assessment & Plan Note (Signed)
Continue prednisone Continue John Hinton

## 2022-08-19 NOTE — Assessment & Plan Note (Addendum)
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 DDx includes: nutritional, paraneoplastic/autoimmune/post infectious and leptomeningeal metastatic dz. Recent MRI brain was negative 7 days ago. See neuro consult note: neuro exam with symmetric length dependent ataxia with sensory greater than cerebellar ataxia with absent vibratory sensation in feet and decreased proprioception Plan for LP after 48h eliquis washout Vitamin B12, folate, SPEP, copper, ceruloplasmin, vitamin E, celiac panel, zinc, RPR, vitamin B1, HIV antibody.  Serum paraneoplastic antibody panel.  Holding eliquis TSH today was normal

## 2022-08-19 NOTE — H&P (Signed)
History and Physical    Patient: John Hinton HWE:[id number] DOB: [birth year] DOA: 08/19/2022 DOS: the patient was seen and examined on 08/19/2022 PCP: [name], MD  Patient coming from: Home  Chief Complaint:  Chief Complaint  Patient presents with   Weakness   Lung Cancer   HPI: John Hinton is Hinton 77 y.o. male with medical history significant of stage 4 adenocarcinoma of lung with mets to lungs, B adrenal glands, bone.  Prior PE without RHS on chronic eliquis.  BPH, HTN, dilated cardiomyopathy.  Pt presents to ED with ~1 week h/o generalized weakness, difficulty walking.  Reports difficulty grabbing stuff with hands, occasionally miss target.  Progressively worse over past 3-4 days to point where he cannot walk.  No recent fever, chills, has baseline cough and runny nose and no change to that recently, no sore throat or other signs of Hinton URI.  No signs or symptoms of gastroenteritis, no nausea, no vomiting, no diarrhea.  No signs or symptoms of Hinton UTI with no burning when urinating or hematuria.   Had 1 dose of immunotherapy on 07/29/22.  MRI brain earlier this week was negative for acute findings.   Review of Systems: As mentioned in the history of present illness. All other systems reviewed and are negative. Past Medical History:  Diagnosis Date   BPH (benign prostatic hypertrophy)    Bradycardia    Hinton. H/o asymptomatic bradycardia.   Coronary artery disease    Hinton. s/p CABG ~2007 with LIMA to LAD, SVG seq to OM1 and OM2, SVG to PDA and SVG to diag. b. Abnormal nuc 03/2015 - s/p DES to SVG-OM1-OM2. 01/29/17 POBA with cutting balloon to SVG-->OM.  No ischemia Lexi myoview 2021   DCM (dilated cardiomyopathy) ([place]) 09/17/2015   mixed by cMRI 03/2021 with EF 39% with non viable infarct in basal to mid lateral wall and LGE pattern c/w nonischemic component as well. Mild RV dysfunction RVEF 42%.   Dyslipidemia    Dyspnea    r/t lung mass   ED (erectile dysfunction)    Fear  of heights    Goals of care, counseling/discussion 06/19/2020   History of radiation therapy    T spine and left ilium 10/07/2021-10/20/2021  Dr Gery Pray   Hypertension    Non-small cell carcinoma of lung, stage 4, left ([place]) 06/19/2020   OSA (obstructive sleep apnea) 07/12/2015   Moderate OSA with AHI 16.5/hr now on CPAP at 8cm H2O, uses cpap nightly      RBBB    Shoulder, capsulitis, adhesive    Left Shoulder   Past Surgical History:  Procedure Laterality Date   APPENDECTOMY     BRONCHIAL BIOPSY  05/09/2020   Procedure: BRONCHIAL BIOPSIES;  Surgeon: [name], DO;  Location: [facility];  Service: Pulmonary;;   BRONCHIAL BRUSHINGS  05/09/2020   Procedure: BRONCHIAL BRUSHINGS;  Surgeon: [name], DO;  Location: [facility];  Service: Pulmonary;;   BRONCHIAL NEEDLE ASPIRATION BIOPSY  05/09/2020   Procedure: BRONCHIAL NEEDLE ASPIRATION BIOPSIES;  Surgeon: [name], DO;  Location: [facility];  Service: Pulmonary;;   BRONCHIAL WASHINGS  05/09/2020   Procedure: BRONCHIAL WASHINGS;  Surgeon: [name], DO;  Location: [facility];  Service: Pulmonary;;   CARDIAC CATHETERIZATION N/Hinton 04/05/2015   Procedure: Left Heart Cath and Coronary Angiography;  Surgeon: Sherren Mocha, MD;  Location: [facility];  Service: Cardiovascular;  Laterality: N/Hinton;   COLONOSCOPY     CORONARY ARTERY BYPASS GRAFT  w/LIMA to LAD, seq SVG to OM1 and OM2, SVG to PDA and SVG to Diagonal   CORONARY BALLOON ANGIOPLASTY N/Hinton 01/29/2017   Procedure: Coronary Balloon Angioplasty;  Surgeon: Martinique, Peter M, MD;  Location: [facility];  Service: Cardiovascular;  Laterality: N/Hinton;   FIDUCIAL MARKER PLACEMENT  05/09/2020   Procedure: FIDUCIAL MARKER PLACEMENT;  Surgeon: [name], DO;  Location: [facility];  Service: Pulmonary;;   INTERCOSTAL NERVE BLOCK Left 05/22/2020   Procedure: INTERCOSTAL NERVE BLOCK;  Surgeon: Lajuana Matte, MD;  Location: [facility];  Service:  Thoracic;  Laterality: Left;   LEFT HEART CATH AND CORS/GRAFTS ANGIOGRAPHY N/Hinton 01/29/2017   Procedure: Left Heart Cath and Cors/Grafts Angiography;  Surgeon: Martinique, Peter M, MD;  Location: [facility];  Service: Cardiovascular;  Laterality: N/Hinton;   NODE DISSECTION N/Hinton 05/22/2020   Procedure: NODE DISSECTION;  Surgeon: Lajuana Matte, MD;  Location: [facility];  Service: Thoracic;  Laterality: N/Hinton;   VIDEO BRONCHOSCOPY WITH ENDOBRONCHIAL NAVIGATION N/Hinton 05/09/2020   Procedure: VIDEO BRONCHOSCOPY WITH ENDOBRONCHIAL NAVIGATION;  Surgeon: [name], DO;  Location: [facility];  Service: Pulmonary;  Laterality: N/Hinton;   VIDEO BRONCHOSCOPY WITH ENDOBRONCHIAL ULTRASOUND N/Hinton 05/09/2020   Procedure: VIDEO BRONCHOSCOPY WITH ENDOBRONCHIAL ULTRASOUND;  Surgeon: [name], DO;  Location: [facility];  Service: Pulmonary;  Laterality: N/Hinton;   Social History:  reports that he quit smoking about 39 years ago. His smoking use included cigarettes. He quit smokeless tobacco use about 36 years ago. He reports current alcohol use of about 20.0 - 28.0 standard drinks of alcohol per week. He reports that he does not use drugs.  Allergies  Allergen Reactions   Amlodipine Swelling    Swelling of ankles    Family History  Problem Relation Age of Onset   Lung cancer Mother    Lung cancer Father    Heart disease Father    Heart failure Father    Dementia Father    Cancer Sister     Prior to Admission medications   Medication Sig Start Date End Date Taking? Authorizing Provider  adagrasib (KRAZATI) 200 MG tablet Take 2 tablets (400 mg total) by mouth 2 (two) times daily. Patient taking differently: Take 200 mg by mouth 2 (two) times daily. 04/17/22  Yes John Napoleon, MD  aspirin 81 MG tablet Take 81 mg by mouth at bedtime.    Yes [provider]  cholecalciferol (VITAMIN D) 1000 UNITS tablet Take 1,000 Units by mouth at bedtime.    Yes [provider]  Cyanocobalamin (B-12  PO) Take 1,000 mcg by mouth daily.    Yes [provider]  diphenoxylate-atropine (LOMOTIL) 2.5-0.025 MG tablet TAKE 2 TABLETS BY MOUTH AT ONSET OF DIARRHEA THEN TAKE 1 TABLET AFTER EACH LOOSE STOOL MAX 8 TABLETS PER DAY 05/19/22  Yes John Amy, NP  ELIQUIS 5 MG TABS tablet TAKE 1 TABLET BY MOUTH TWICE Hinton DAY 06/27/22  Yes Ennever, Rudell Cobb, MD  ezetimibe (ZETIA) 10 MG tablet Take 1 tablet (10 mg total) by mouth daily. 10/15/21  Yes Turner, Eber Hong, MD  folic acid (FOLVITE) 1 MG tablet TAKE ONE TABLET BY MOUTH DAILY START 5 TO 7 DAYS PRIOR TO CHEMO AND CONTINUE UNTIL 21 DAYS AFTER COMPLETES 06/15/22  Yes Ennever, Rudell Cobb, MD  levothyroxine (SYNTHROID) 50 MCG tablet Take 1 tablet (50 mcg total) by mouth daily before breakfast. 06/17/22  Yes Ennever, Rudell Cobb, MD  metoprolol succinate (TOPROL-XL) 25  MG 24 hr tablet Take 1 tablet (25 mg total) by mouth at bedtime. 06/03/21  Yes Turner, Eber Hong, MD  Multiple Vitamin (MULTIVITAMIN) capsule Take 1 capsule by mouth daily.   Yes [provider]  nitroGLYCERIN (NITROSTAT) 0.4 MG SL tablet Place 1 tablet (0.4 mg total) under the tongue every 5 (five) minutes as needed for chest pain. 06/03/21  Yes Turner, Eber Hong, MD  oxyCODONE-acetaminophen (PERCOCET) 7.5-325 MG tablet Take 1 tablet by mouth every 4 (four) hours as needed for severe pain. 09/09/21  Yes John Napoleon, MD  predniSONE (DELTASONE) 10 MG tablet Take 1 tablet (10 mg total) by mouth daily with breakfast. 06/17/22  Yes Ennever, Rudell Cobb, MD  rosuvastatin (CRESTOR) 40 MG tablet TAKE ONE TABLET BY MOUTH EVERY NIGHT AT BEDTIME 06/16/22  Yes Turner, Eber Hong, MD  sacubitril-valsartan (ENTRESTO) 24-26 MG TAKE ONE TABLET BY MOUTH TWICE Hinton DAY 06/08/22  Yes Turner, Traci R, MD  sertraline (ZOLOFT) 100 MG tablet Take 100 mg by mouth 2 (two) times daily. 10/08/21  Yes [provider]  sucralfate (CARAFATE) 1 g tablet Take 1 tablet (1 g total) by mouth 4 (four) times daily. Dissolve  each tablet in 15 cc water before use. Patient taking differently: Take 1 g by mouth 4 (four) times daily as needed. Dissolve each tablet in 15 cc water before use. 10/24/21  Yes Kyung Rudd, MD  traMADol (ULTRAM) 50 MG tablet Take 1 tablet (50 mg total) by mouth every 6 (six) hours as needed. 09/22/21  Yes John Napoleon, MD  colchicine 0.6 MG tablet Take 1 tablet (0.6 mg total) by mouth every 8 (eight) hours as needed. 09/29/21   John Napoleon, MD  prochlorperazine (COMPAZINE) 10 MG tablet Take 1 tablet (10 mg total) by mouth every 6 (six) hours as needed (Nausea or vomiting). 06/24/20 04/25/21  John Napoleon, MD    Physical Exam: Vitals:   08/19/22 1945 08/19/22 2000 08/19/22 2015 08/19/22 2030  BP: (!) 106/59 (!) 92/57 99/63 94/66   Pulse: 62 61 67 60  Resp: 19 18 19 16   Temp:      TempSrc:      SpO2: 98% 98% 96% 97%  Weight:      Height:       Constitutional: NAD, calm, comfortable Neck: No JVD, supple Respiratory: clear to auscultation bilaterally, no wheezing, no crackles. Normal respiratory effort. No accessory muscle use.  Cardiovascular: Regular rate and rhythm, no murmurs / rubs / gallops. No extremity edema. 2+ pedal pulses. No carotid bruits.  Neurologic: Pt with ataxia on exam, decreased to absent sensation to vibration and proprioception in extremities.  Strength 5/5 in all 4.  Data Reviewed:  There are no new results to review at this time.  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 DDx includes: nutritional, paraneoplastic/autoimmune/post infectious and leptomeningeal metastatic dz. Recent MRI brain was negative 7 days ago. See neuro consult note: neuro exam with symmetric length dependent ataxia with sensory greater than cerebellar ataxia with absent vibratory sensation in feet and decreased proprioception Plan for LP after 48h eliquis washout Vitamin B12,  folate, SPEP, copper, ceruloplasmin, vitamin E, celiac panel, zinc, RPR, vitamin B1, HIV antibody.  Serum paraneoplastic antibody panel.  Holding eliquis TSH today was normal  Non-small cell carcinoma of lung, stage 4, left ([facility]) Continue prednisone Continue Krazati  DCM (dilated cardiomyopathy) ([place]) Continue entresto, crestor. Cont ASA 81 for the  moment. Metoprolol on hold by onc. Echo last week = EF 50%  Essential hypertension, benign Continue Entresto Metoprolol on hold.      Advance Care Planning:   Code Status: Full Code Confirmed directly with patient  Consults: Dr. Lorrin Goodell  Family Communication: Wife at bedside  Severity of Illness: The appropriate patient status for this patient is OBSERVATION. Observation status is judged to be reasonable and necessary in order to provide the required intensity of service to ensure the patient's safety. The patient's presenting symptoms, physical exam findings, and initial radiographic and laboratory data in the context of their medical condition is felt to place them at decreased risk for further clinical deterioration. Furthermore, it is anticipated that the patient will be medically stable for discharge from the hospital within 2 midnights of admission.   Author: Etta Quill., DO 08/19/2022 9:54 PM  For on call review [email or web address].

## 2022-08-19 NOTE — ED Provider Triage Note (Signed)
Emergency Medicine Provider Triage Evaluation Note  John Hinton , a 77 y.o. male  was evaluated in triage.  Pt complains of weakness and intermittent episodes of confusion.  He is a lung cancer patient.  He follows Dr. Marin Olp.  Has been seen recently for his symptoms and had an MRI of his brain.  Also had chest imaging.  Is still on chemotherapy.  States he was so weak this morning that he was not able to walk.  EMS was called for this.  Denies fevers.  States he was cold last night.  He has shortness of breath at baseline.  Denies chest pain.  Review of Systems  Positive: As above Negative: As above  Physical Exam  BP 108/76 (BP Location: Left Arm)   Pulse (!) 59   Temp 99.2 F (37.3 C) (Oral)   Resp 18   Ht 5\' 11"  (1.803 m)   Wt 81.6 kg   SpO2 100%   BMI 25.10 kg/m  Gen:   Awake, no distress   Resp:  Normal effort  MSK:   Moves extremities without difficulty  Other:    Medical Decision Making  Medically screening exam initiated at 2:34 PM.  Appropriate orders placed.  [name] was informed that the remainder of the evaluation will be completed by another provider, this initial triage assessment does not replace that evaluation, and the importance of remaining in the ED until their evaluation is complete.     Roylene Reason, [place] 08/19/22 1436

## 2022-08-19 NOTE — ED Notes (Signed)
ED TO INPATIENT HANDOFF REPORT  ED Nurse Name and Phone #: [phone]  S Name/Age/Gender John Hinton 77 y.o. male Room/Bed: 019C/019C  Code Status   Code Status: Full Code  Home/SNF/Other Home Patient oriented to: self, place, time, and situation Is this baseline? Yes   Triage Complete: Triage complete  Chief Complaint Ataxia [R27.0]  Triage Note Pt BIB [facility] from home d/t weakness over the past week having trouble ambulating & getting around at baseline. He does have Stage 4 Lung CA & sees oncologist for this who has recently stopped his chemo pill that he used to take weekly. Pt endorses an upcoming Neurologist appointment regarding his s/s plus having a recent CT & CXR for his s/s as well & c/o HA upon arrival to triage. EMS reports A/Ox4, 140/90, 65 bpm, 12 L was good, 18g Lt AC.   Allergies Allergies  Allergen Reactions   Amlodipine Swelling    Swelling of ankles    Level of Care/Admitting Diagnosis ED Disposition     ED Disposition  Admit   Condition  --   Comment  Hospital Area: [place] [[id number]]  Level of Care: Med-Surg [[id number]]  May place patient in observation at [facility] or [place] if equivalent level of care is available:: No  Covid Evaluation: Asymptomatic - no recent exposure (last 10 days) testing not required  Diagnosis: Ataxia [767341]  Admitting Physician: Etta Quill [[id number]]  Attending Physician: Etta Quill [id number]          B Medical/Surgery History Past Medical History:  Diagnosis Date   BPH (benign prostatic hypertrophy)    Bradycardia    a. H/o asymptomatic bradycardia.   Coronary artery disease    a. s/p CABG ~2007 with LIMA to LAD, SVG seq to OM1 and OM2, SVG to PDA and SVG to diag. b. Abnormal nuc 03/2015 - s/p DES to SVG-OM1-OM2. 01/29/17 POBA with cutting balloon to SVG-->OM.  No ischemia Lexi myoview 2021   DCM (dilated cardiomyopathy) ([place]) 09/17/2015   mixed by cMRI 03/2021 with EF 39% with  non viable infarct in basal to mid lateral wall and LGE pattern c/w nonischemic component as well. Mild RV dysfunction RVEF 42%.   Dyslipidemia    Dyspnea    r/t lung mass   ED (erectile dysfunction)    Fear of heights    Goals of care, counseling/discussion 06/19/2020   History of radiation therapy    T spine and left ilium 10/07/2021-10/20/2021  Dr Gery Pray   Hypertension    Non-small cell carcinoma of lung, stage 4, left ([place]) 06/19/2020   OSA (obstructive sleep apnea) 07/12/2015   Moderate OSA with AHI 16.5/hr now on CPAP at 8cm H2O, uses cpap nightly      RBBB    Shoulder, capsulitis, adhesive    Left Shoulder   Past Surgical History:  Procedure Laterality Date   APPENDECTOMY     BRONCHIAL BIOPSY  05/09/2020   Procedure: BRONCHIAL BIOPSIES;  Surgeon: [name], DO;  Location: [facility];  Service: Pulmonary;;   BRONCHIAL BRUSHINGS  05/09/2020   Procedure: BRONCHIAL BRUSHINGS;  Surgeon: [name], DO;  Location: [facility];  Service: Pulmonary;;   BRONCHIAL NEEDLE ASPIRATION BIOPSY  05/09/2020   Procedure: BRONCHIAL NEEDLE ASPIRATION BIOPSIES;  Surgeon: [name], DO;  Location: [facility];  Service: Pulmonary;;   BRONCHIAL WASHINGS  05/09/2020   Procedure: BRONCHIAL WASHINGS;  Surgeon: [name], DO;  Location: [place] [place];  Service:  Pulmonary;;   CARDIAC CATHETERIZATION N/A 04/05/2015   Procedure: Left Heart Cath and Coronary Angiography;  Surgeon: Sherren Mocha, MD;  Location: [facility];  Service: Cardiovascular;  Laterality: N/A;   COLONOSCOPY     CORONARY ARTERY BYPASS GRAFT     w/LIMA to LAD, seq SVG to OM1 and OM2, SVG to PDA and SVG to Diagonal   CORONARY BALLOON ANGIOPLASTY N/A 01/29/2017   Procedure: Coronary Balloon Angioplasty;  Surgeon: Martinique, Peter M, MD;  Location: [facility];  Service: Cardiovascular;  Laterality: N/A;   FIDUCIAL MARKER PLACEMENT  05/09/2020   Procedure: FIDUCIAL MARKER PLACEMENT;  Surgeon:  [name], DO;  Location: [facility];  Service: Pulmonary;;   INTERCOSTAL NERVE BLOCK Left 05/22/2020   Procedure: INTERCOSTAL NERVE BLOCK;  Surgeon: Lajuana Matte, MD;  Location: [facility];  Service: Thoracic;  Laterality: Left;   LEFT HEART CATH AND CORS/GRAFTS ANGIOGRAPHY N/A 01/29/2017   Procedure: Left Heart Cath and Cors/Grafts Angiography;  Surgeon: Martinique, Peter M, MD;  Location: [facility];  Service: Cardiovascular;  Laterality: N/A;   NODE DISSECTION N/A 05/22/2020   Procedure: NODE DISSECTION;  Surgeon: Lajuana Matte, MD;  Location: [facility];  Service: Thoracic;  Laterality: N/A;   VIDEO BRONCHOSCOPY WITH ENDOBRONCHIAL NAVIGATION N/A 05/09/2020   Procedure: VIDEO BRONCHOSCOPY WITH ENDOBRONCHIAL NAVIGATION;  Surgeon: [name], DO;  Location: [facility];  Service: Pulmonary;  Laterality: N/A;   VIDEO BRONCHOSCOPY WITH ENDOBRONCHIAL ULTRASOUND N/A 05/09/2020   Procedure: VIDEO BRONCHOSCOPY WITH ENDOBRONCHIAL ULTRASOUND;  Surgeon: [name], DO;  Location: [facility];  Service: Pulmonary;  Laterality: N/A;     A IV Location/Drains/Wounds Patient Lines/Drains/Airways Status     Active Line/Drains/Airways     Name Placement date Placement time Site Days   Peripheral IV 08/19/22 18 G Anterior;Distal;Left;Upper Arm 08/19/22  1827  Arm  less than 1            Intake/Output Last 24 hours No intake or output data in the 24 hours ending 08/19/22 2041  Labs/Imaging Results for orders placed or performed during the hospital encounter of 08/19/22 (from the past 48 hour(s))  Urinalysis, Routine w reflex microscopic -Urine, Clean Catch     Status: Abnormal   Collection Time: 08/19/22  2:35 PM  Result Value Ref Range   Color, Urine YELLOW YELLOW   APPearance CLEAR CLEAR   Specific Gravity, Urine 1.019 1.005 - 1.030   pH 6.0 5.0 - 8.0   Glucose, UA NEGATIVE NEGATIVE mg/dL   Hgb urine dipstick SMALL (A) NEGATIVE   Bilirubin Urine NEGATIVE  NEGATIVE   Ketones, ur NEGATIVE NEGATIVE mg/dL   Protein, ur NEGATIVE NEGATIVE mg/dL   Nitrite NEGATIVE NEGATIVE   Leukocytes,Ua NEGATIVE NEGATIVE   RBC / HPF 6-10 0 - 5 RBC/hpf   WBC, UA 0-5 0 - 5 WBC/hpf   Bacteria, UA NONE SEEN NONE SEEN   Squamous Epithelial / HPF 0-5 0 - 5 /HPF   Mucus PRESENT     Comment: Performed at [facility], [place] [address] [place], [place] [address]  CBC WITH DIFFERENTIAL     Status: Abnormal   Collection Time: 08/19/22  2:38 PM  Result Value Ref Range   WBC 5.9 4.0 - 10.5 K/uL   RBC 3.47 (L) 4.22 - 5.81 MIL/uL   Hemoglobin 11.6 (L) 13.0 - 17.0 g/dL   HCT 35.0 (L) 39.0 - 52.0 %   MCV 100.9 (H) 80.0 - 100.0 fL  MCH 33.4 26.0 - 34.0 pg   MCHC 33.1 30.0 - 36.0 g/dL   RDW 14.8 11.5 - 15.5 %   Platelets 112 (L) 150 - 400 K/uL   nRBC 0.0 0.0 - 0.2 %   Neutrophils Relative % 87 %   Neutro Abs 5.1 1.7 - 7.7 K/uL   Lymphocytes Relative 6 %   Lymphs Abs 0.3 (L) 0.7 - 4.0 K/uL   Monocytes Relative 7 %   Monocytes Absolute 0.4 0.1 - 1.0 K/uL   Eosinophils Relative 0 %   Eosinophils Absolute 0.0 0.0 - 0.5 K/uL   Basophils Relative 0 %   Basophils Absolute 0.0 0.0 - 0.1 K/uL   Immature Granulocytes 0 %   Abs Immature Granulocytes 0.02 0.00 - 0.07 K/uL    Comment: Performed at [facility] [address] [place], [place] [address]  Comprehensive metabolic panel     Status: Abnormal   Collection Time: 08/19/22  2:38 PM  Result Value Ref Range   Sodium 143 135 - 145 mmol/L   Potassium 3.1 (L) 3.5 - 5.1 mmol/L   Chloride 108 98 - 111 mmol/L   CO2 27 22 - 32 mmol/L   Glucose, Bld 115 (H) 70 - 99 mg/dL    Comment: Glucose reference range applies only to samples taken after fasting for at least 8 hours.   BUN 21 8 - 23 mg/dL   Creatinine, Ser 1.21 0.61 - 1.24 mg/dL   Calcium 8.8 (L) 8.9 - 10.3 mg/dL   Total Protein 6.7 6.5 - 8.1 g/dL   Albumin 4.0 3.5 - 5.0 g/dL   AST 21 15 - 41 U/L   ALT 20 0 - 44 U/L   Alkaline Phosphatase 40 38 - 126  U/L   Total Bilirubin 0.7 0.3 - 1.2 mg/dL   GFR, Estimated >60 >60 mL/min    Comment: (NOTE) Calculated using the CKD-EPI Creatinine Equation (2021)    Anion gap 8 5 - 15    Comment: Performed at [facility] [address], [place], [place] [address]  Magnesium     Status: Abnormal   Collection Time: 08/19/22  2:38 PM  Result Value Ref Range   Magnesium 2.6 (H) 1.7 - 2.4 mg/dL    Comment: Performed at [facility] [address], [place], [place] [address]  Phosphorus     Status: None   Collection Time: 08/19/22  2:38 PM  Result Value Ref Range   Phosphorus 3.3 2.5 - 4.6 mg/dL    Comment: Performed at [facility] [address] [place], [place] [address]  TSH     Status: None   Collection Time: 08/19/22  2:38 PM  Result Value Ref Range   TSH 2.954 0.350 - 4.500 uIU/mL    Comment: Performed by a 3rd Generation assay with a functional sensitivity of <=0.01 uIU/mL. Performed at [facility], [place] [address] [place],  [address]   Troponin I (High Sensitivity)     Status: Abnormal   Collection Time: 08/19/22  3:12 PM  Result Value Ref Range   Troponin I (High Sensitivity) 38 (H) <18 ng/L    Comment: (NOTE) Elevated high sensitivity troponin I (hsTnI) values and significant  changes across serial measurements may suggest ACS but many other  chronic and acute conditions are known to elevate hsTnI results.  Refer to the "Links" section for chest pain algorithms and additional  guidance. Performed at [facility],  [address] [place], [place] [address]    CT HEAD WO CONTRAST (5MM)  Result Date: 09-08-2022 CLINICAL DATA:  Weakness EXAM: CT HEAD WITHOUT CONTRAST TECHNIQUE: Contiguous axial images were obtained from the base of the skull through the vertex without intravenous contrast. RADIATION DOSE REDUCTION: This exam was performed according to the departmental dose-optimization program which includes automated exposure control,  adjustment of the mA and/or kV according to patient size and/or use of iterative reconstruction technique. COMPARISON:  08/10/2022 FINDINGS: Brain: No evidence of acute infarction, hemorrhage, hydrocephalus, extra-axial collection or mass lesion/mass effect. Mild atrophic changes are noted. Vascular: No hyperdense vessel or unexpected calcification. Skull: Normal. Negative for fracture or focal lesion. Sinuses/Orbits: No acute finding. Other: None. IMPRESSION: No acute intracranial abnormality noted. Mild atrophic changes are noted. Electronically Signed   By: Inez Catalina M.D.   On: 09/08/2022 18:05   DG Chest Port 1 View  Result Date: September 08, 2022 CLINICAL DATA:  Shortness of breath. Worsening shortness of breath and weakness. EXAM: PORTABLE CHEST 1 VIEW COMPARISON:  Radiograph 2 days ago 08/09/2022. PET CT 08/17/2021 FINDINGS: Prior median sternotomy and CABG. Stable heart size and mediastinal contours. Postsurgical volume loss in the left hemithorax. Small left pleural effusion and basilar opacity is similar to recent imaging. Left pleural thickening was seen on recent PET. The right lung is clear. No pulmonary edema. No pneumothorax. IMPRESSION: 1. Stable radiographic appearance of the chest. Left pleural effusion/thickening and basilar opacity is similar to recent imaging. 2. Prior CABG. Electronically Signed   By: Keith Rake M.D.   On: 09/08/2022 17:54    Pending Labs Unresulted Labs (From admission, onward)     Start     Ordered   08/20/22 0500  Miscellaneous [facility] test (send-out)  Tomorrow morning,   URGENT       Comments: serum Paraneoplastic encephalopathy panel   Question:  Test name / description:  Answer:  serum Paraneoplastic encephalopathy panel   2022-09-08 1906   08/20/22 0500  CBC  Tomorrow morning,   R        09-08-2022 2015   08/20/22 4008  Basic metabolic panel  Tomorrow morning,   R        09-08-22 2015   09/08/22 2000  Ceruloplasmin  Once,   R        Sep 08, 2022 2000    09-08-2022 2000  Gliadin antibodies, serum  Once,   R        08-Sep-2022 2000   2022-09-08 1935  Copper, serum  Once,   R        09/08/22 1935   2022-09-08 1935  HIV Antibody (routine testing w rflx)  Once,   R        09/08/22 1935   09-08-2022 1935  RPR  Once,   R        2022-09-08 1935   2022/09/08 1935  Reticulin Antibody, IgA w titer  Once,   R        09-08-22 1935   September 08, 2022 1935  Tissue transglutaminase, IgA  Once,   R        09/08/2022 1935   Sep 08, 2022 1935  Vitamin B1  Once,   R        2022-09-08 1935   08-Sep-2022 1935  Zinc  Once,   R        Sep 08, 2022 1935   2022-09-08 1906  Vitamin B12  Once,   URGENT        09-08-22 1905  08/19/22 1906  Folate  Once,   URGENT        08/19/22 1905   08/19/22 1906  Protein electrophoresis, serum  Once,   URGENT        08/19/22 1905   08/19/22 1755  VDRL, CSF  (Meningitis Panel)  Once,   URGENT        08/19/22 1754   08/19/22 1755  HSV 1/2 PCR, CSF (reference lab) Cerebrospinal Fluid  (Meningitis Panel)  Once,   URGENT        08/19/22 1754   08/19/22 1754  CSF cell count with differential collection tube #: 1  (Meningitis Panel)  Once,   STAT       Question:  collection tube #  Answer:  1   08/19/22 1754   08/19/22 1754  CSF cell count with differential collection tube #: 4  (Meningitis Panel)  Once,   STAT       Question:  collection tube #  Answer:  4   08/19/22 1754   08/19/22 1754  CSF culture w Gram Stain  (Meningitis Panel)  Once,   URGENT       Question:  Are there also cytology or pathology orders on this specimen?  Answer:  Yes   08/19/22 1754   08/19/22 1754  Protein and glucose, CSF  (Meningitis Panel)  Once,   STAT        08/19/22 1754   08/19/22 1754  Oligoclonal bands, CSF + serum  (Meningitis Panel)  Once,   URGENT        08/19/22 1754   08/19/22 1754  Draw extra clot tube  (Meningitis Panel)  Once,   URGENT        08/19/22 1754            Vitals/Pain Today's Vitals   08/19/22 1945 08/19/22 2000 08/19/22 2015 08/19/22 2030  BP: (!)  106/59 (!) 92/57 99/63 94/66   Pulse: 62 61 67 60  Resp: 19 18 19 16   Temp:      TempSrc:      SpO2: 98% 98% 96% 97%  Weight:      Height:      PainSc:        Isolation Precautions No active isolations  Medications Medications  enoxaparin (LOVENOX) injection 40 mg (has no administration in time range)  acetaminophen (TYLENOL) tablet 650 mg (has no administration in time range)    Or  acetaminophen (TYLENOL) suppository 650 mg (has no administration in time range)  ondansetron (ZOFRAN) tablet 4 mg (has no administration in time range)    Or  ondansetron (ZOFRAN) injection 4 mg (has no administration in time range)  lidocaine (XYLOCAINE) 2 % (with pres) injection 200 mg (200 mg Infiltration Given 08/19/22 2018)  potassium chloride SA (KLOR-CON M) CR tablet 40 mEq (40 mEq Oral Given 08/19/22 2032)    Mobility walks     Focused Assessments Neuro Assessment Handoff:  Swallow screen pass? Yes          Neuro Assessment: Within Defined Limits Neuro Checks:      Has TPA been given? No If patient is a Neuro Trauma and patient is going to OR before floor call report to [facility] nurse: [phone] or [phone]   R Recommendations: See Admitting Provider Note  Report given to:   Additional Notes: Call me if you have question, the patient will be sent to the floor at 2120

## 2022-08-19 NOTE — ED Triage Notes (Signed)
Pt BIB [facility] from home d/t weakness over the past week having trouble ambulating & getting around at baseline. He does have Stage 4 Lung CA & sees oncologist for this who has recently stopped his chemo pill that he used to take weekly. Pt endorses an upcoming Neurologist appointment regarding his s/s plus having a recent CT & CXR for his s/s as well & c/o HA upon arrival to triage. EMS reports A/Ox4, 140/90, 65 bpm, 12 L was good, 18g Lt AC.

## 2022-08-19 NOTE — Consult Note (Addendum)
NEUROLOGY CONSULTATION NOTE   Date of service: August 19, 2022 Patient Name: John Hinton MRN:  [id number] DOB:  [birth year] Reason for consult: "difficulty walking" Requesting Provider: Drenda Freeze, MD _ _ _   _ __   _ __ _ _  __ __   _ __   __ _  History of Present Illness  John Hinton is a 77 y.o. 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  He reports that over the last week, he has noticed that when he is trying to grab stuff with his hands, he will intermittently and occasionally missed the target.  Wife is at the bedside and adds to the history and she reports that patient's sister was here last week and patient went to pick her up at the airport and the sister noted that patient was driving more erratically going over the curb and into the shoulder at times before correcting himself.  Over the last 3 to 4 days, this has gotten so worse that patient is having trouble walking.  He is short of breath at baseline but this is much different and he feels very off balance when he is walking.  No recent fever, chills, has baseline cough and runny nose and no change to that recently, no sore throat or other signs of a URI.  No signs or symptoms of gastroenteritis, no nausea, no vomiting, no diarrhea.  No signs or symptoms of a UTI with no burning when urinating or hematuria.  No recent travels, eats a good mix of meat and vegetables and dairy.  He takes a multivitamin daily specifically takes a vitamin Q22 and folic acid every day.  They do endorse a history of alcohol intake in the past but has not had any alcohol over the last 2-3 months.  He stopped drinking around Thanksgiving.  Prior to that, he would drink about four 1.5 ounce whiskeys  daily.  He does not use any recreational substances.  He reports history of dementia.  His father had dementia about a year or 2 before his death.  Father died at age 25.   ROS   Constitutional Denies weight loss, fever and chills.   HEENT Denies changes in vision and hearing.   Respiratory Denies SOB and cough.   CV Denies palpitations and CP   GI Denies abdominal pain, nausea, vomiting and diarrhea.   GU Denies dysuria and urinary frequency.   MSK Denies myalgia and joint pain.   Skin Denies rash and pruritus.   Neurological Denies headache and syncope.   Psychiatric Denies recent changes in mood. Denies anxiety and depression.    Past History   Past Medical History:  Diagnosis Date   BPH (benign prostatic hypertrophy)    Bradycardia    a. H/o asymptomatic bradycardia.   Coronary artery disease    a. s/p CABG ~2007 with LIMA to LAD, SVG seq to OM1 and OM2, SVG to PDA and SVG to diag. b. Abnormal nuc 03/2015 - s/p DES to SVG-OM1-OM2. 01/29/17 POBA with cutting balloon to SVG-->OM.  No ischemia Lexi myoview 2021   DCM (dilated cardiomyopathy) ([place]) 09/17/2015   mixed by cMRI 03/2021 with EF 39% with non viable infarct in basal to mid lateral wall and LGE pattern c/w  nonischemic component as well. Mild RV dysfunction RVEF 42%.   Dyslipidemia    Dyspnea    r/t lung mass   ED (erectile dysfunction)    Fear of heights    Goals of care, counseling/discussion 06/19/2020   History of radiation therapy    T spine and left ilium 10/07/2021-10/20/2021  Dr Gery Pray   Hypertension    Non-small cell carcinoma of lung, stage 4, left ([place]) 06/19/2020   OSA (obstructive sleep apnea) 07/12/2015   Moderate OSA with AHI 16.5/hr now on CPAP at 8cm H2O, uses cpap nightly      RBBB    Shoulder, capsulitis, adhesive    Left Shoulder   Past Surgical History:  Procedure Laterality Date   APPENDECTOMY     BRONCHIAL BIOPSY  05/09/2020   Procedure: BRONCHIAL BIOPSIES;  Surgeon: [name],  DO;  Location: [facility];  Service: Pulmonary;;   BRONCHIAL BRUSHINGS  05/09/2020   Procedure: BRONCHIAL BRUSHINGS;  Surgeon: [name], DO;  Location: [facility];  Service: Pulmonary;;   BRONCHIAL NEEDLE ASPIRATION BIOPSY  05/09/2020   Procedure: BRONCHIAL NEEDLE ASPIRATION BIOPSIES;  Surgeon: [name], DO;  Location: [facility];  Service: Pulmonary;;   BRONCHIAL WASHINGS  05/09/2020   Procedure: BRONCHIAL WASHINGS;  Surgeon: [name], DO;  Location: [facility];  Service: Pulmonary;;   CARDIAC CATHETERIZATION N/A 04/05/2015   Procedure: Left Heart Cath and Coronary Angiography;  Surgeon: Sherren Mocha, MD;  Location: [facility];  Service: Cardiovascular;  Laterality: N/A;   COLONOSCOPY     CORONARY ARTERY BYPASS GRAFT     w/LIMA to LAD, seq SVG to OM1 and OM2, SVG to PDA and SVG to Diagonal   CORONARY BALLOON ANGIOPLASTY N/A 01/29/2017   Procedure: Coronary Balloon Angioplasty;  Surgeon: Martinique, Peter M, MD;  Location: [facility];  Service: Cardiovascular;  Laterality: N/A;   FIDUCIAL MARKER PLACEMENT  05/09/2020   Procedure: FIDUCIAL MARKER PLACEMENT;  Surgeon: [name], DO;  Location: [facility];  Service: Pulmonary;;   INTERCOSTAL NERVE BLOCK Left 05/22/2020   Procedure: INTERCOSTAL NERVE BLOCK;  Surgeon: Lajuana Matte, MD;  Location: [facility];  Service: Thoracic;  Laterality: Left;   LEFT HEART CATH AND CORS/GRAFTS ANGIOGRAPHY N/A 01/29/2017   Procedure: Left Heart Cath and Cors/Grafts Angiography;  Surgeon: Martinique, Peter M, MD;  Location: [facility];  Service: Cardiovascular;  Laterality: N/A;   NODE DISSECTION N/A 05/22/2020   Procedure: NODE DISSECTION;  Surgeon: Lajuana Matte, MD;  Location: [facility];  Service: Thoracic;  Laterality: N/A;   VIDEO BRONCHOSCOPY WITH ENDOBRONCHIAL NAVIGATION N/A 05/09/2020   Procedure: VIDEO BRONCHOSCOPY WITH ENDOBRONCHIAL NAVIGATION;  Surgeon: [name], DO;  Location: [facility];  Service: Pulmonary;  Laterality: N/A;   VIDEO BRONCHOSCOPY WITH ENDOBRONCHIAL ULTRASOUND N/A 05/09/2020   Procedure: VIDEO BRONCHOSCOPY WITH ENDOBRONCHIAL ULTRASOUND;  Surgeon: [name], DO;  Location: [facility];  Service: Pulmonary;  Laterality: N/A;   Family History  Problem Relation Age of Onset   Lung cancer Mother    Lung cancer Father    Heart disease Father    Heart failure Father    Cancer Sister    Social History   Socioeconomic History   Marital status: Married    Spouse name: Not on file   Number of children: Not on file   Years of education: Not on file   Highest education level: Not on file  Occupational History  Not on file  Tobacco Use   Smoking status: Former    Years: 15.00    Types: Cigarettes    Quit date: 07/21/1983    Years since quitting: 39.1   Smokeless tobacco: Former    Quit date: 07/20/1986  Vaping Use   Vaping Use: Never used  Substance and Sexual Activity   Alcohol use: Yes    Alcohol/week: 20.0 - 28.0 standard drinks of alcohol    Types: 20 - 28 Standard drinks or equivalent per week    Comment: daily - 20-28 /week   Drug use: No   Sexual activity: Not Currently  Other Topics Concern   Not on file  Social History Narrative   Not on file   Social Determinants of Health   Financial Resource Strain: Low Risk  (06/18/2022)   Overall Financial Resource Strain (CARDIA)    Difficulty of Paying Living Expenses: Not hard at all  Food Insecurity: No Food Insecurity (06/18/2022)   Hunger Vital Sign    Worried About Running Out of Food in the Last Year: Never true    Ran Out of Food in the Last Year: Never true  Transportation Needs: No Transportation Needs (06/18/2022)   PRAPARE - Hydrologist (Medical): No    Lack of Transportation (Non-Medical): No  Physical Activity: Not on file  Stress: No Stress Concern Present (06/18/2022)   [place]    Feeling of Stress : Not at all  Social Connections: Not on file   Allergies  Allergen Reactions   Amlodipine Swelling    Swelling of ankles    Medications  (Not in a hospital admission)    Vitals   Vitals:   08/19/22 1417 08/19/22 1417 08/19/22 1725  BP:  108/76 135/69  Pulse:  (!) 59 65  Resp:  18 (!) 21  Temp:  99.2 F (37.3 C) 98 F (36.7 C)  TempSrc:  Oral Oral  SpO2:  100% 100%  Weight: 81.6 kg    Height: 5\' 11"  (1.803 m)       Body mass index is 25.1 kg/m.  Physical Exam   General: Laying comfortably in bed; in no acute distress.  HENT: Normal oropharynx and mucosa. Normal external appearance of ears and nose.  Neck: Supple, no pain or tenderness  CV: No JVD. No peripheral edema.  Pulmonary: Symmetric Chest rise. Normal respiratory effort.  Abdomen: Soft to touch, non-tender.  Ext: No cyanosis, edema, or deformity  Skin: No rash. Normal palpation of skin.   Musculoskeletal: Normal digits and nails by inspection. No clubbing.   Neurologic Examination  Mental status/Cognition: Alert, oriented to self, place, month and year, good attention.  Able to do simple calculation, spell world backwards.  3 out of 3 recall immediately, 2 out of 3 recall at about 5 minutes. Speech/language: Fluent, comprehension intact, object naming intact, repetition intact.  Cranial nerves:   CN II Pupils equal and reactive to light, no VF deficits    CN III,IV,VI EOM intact, no gaze preference or deviation, no nystagmus    CN V normal sensation in V1, V2, and V3 segments bilaterally    CN VII no asymmetry, no nasolabial fold flattening    CN VIII normal hearing to speech    CN IX & X normal palatal elevation, no uvular deviation    CN XI 5/5 head turn and 5/5 shoulder shrug bilaterally    CN XII midline tongue  protrusion    Motor:  Muscle bulk: normal, tone normal, pronator drift none. tremor noted to have end intention tremor. Mvmt Root Nerve  Muscle Right  Left Comments  SA C5/6 Ax Deltoid 5 5   EF C5/6 Mc Biceps 5 5   EE C6/7/8 Rad Triceps 5 5   WF C6/7 Med FCR     WE C7/8 PIN ECU     F Ab C8/T1 U ADM/FDI 5 5   HF L1/2/3 Fem Illopsoas 5 5   KE L2/3/4 Fem Quad 5 5   DF L4/5 D Peron Tib Ant 5 5   PF S1/2 Tibial Grc/Sol 5 5    Sensation:  Light touch Intact throughout   Pin prick    Temperature    Vibration Absent in bilateral toes and decreased in bilateral knees and bilateral fingertips.  Proprioception Absent in bilateral toes.   Coordination/Complex Motor:  - Finger to Nose with some past-pointing and an intention tremor. - Heel to shin with ataxia bilaterally with eye closed, somewhat improved with his eyes open. - Rapid alternating movement are normal. - Gait: Stride length short. Arm swing poor. Base width wide.  Labs   CBC:  Recent Labs  Lab 08/17/22 0842 08/19/22 1438  WBC 5.7 5.9  NEUTROABS 4.9 5.1  HGB 11.5* 11.6*  HCT 35.7* 35.0*  MCV 102.0* 100.9*  PLT 106* 112*    Basic Metabolic Panel:  Lab Results  Component Value Date   NA 143 08/19/2022   K 3.1 (L) 08/19/2022   CO2 27 08/19/2022   GLUCOSE 115 (H) 08/19/2022   BUN 21 08/19/2022   CREATININE 1.21 08/19/2022   CALCIUM 8.8 (L) 08/19/2022   GFRNONAA >60 08/19/2022   GFRAA 87 02/05/2020   Lipid Panel:  Lab Results  Component Value Date   LDLCALC 77 03/03/2022   HgbA1c: No results found for: "HGBA1C" Urine Drug Screen: No results found for: "LABOPIA", "COCAINSCRNUR", "LABBENZ", "AMPHETMU", "THCU", "LABBARB"  Alcohol Level No results found for: "ETH"  CT Head without contrast(Personally reviewed): CTH was negative for a large hypodensity concerning for a large territory infarct or hyperdensity concerning for an ICH  MRI Brain 08/11/22(Personally reviewed): No acute intracranial process. No evidence of metastatic disease.   Impression   John Hinton is a 77 y.o. male with PMH significant for with PMH significant for stage IV adenocarcinoma  of left upper lobe with mets to lungs, bilateral adrenal glands, bony mets, PE without right heart strain and on Eliquis 5 mg twice daily, history of BPH, hypertension, OSA who presents with about 1 week history of difficulty walking. 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  Etiology is unclear, could be from nutritional, paraneoplastic/autoimmune/post infectious but no obvious cerebellitis noted on imaging, could be from potential leptomeningeal involvement of the cerebellum.  Will need further workup to ascertain the etiology.  Recommendations  -Vitamin B12, folate, SPEP, copper, ceruloplasmin, vitamin E, celiac panel, zinc, RPR, vitamin B1, HIV antibody.  Serum paraneoplastic antibody panel. -He is on Eliquis we will have to wait for 48 hours for washout before we can attempt an LP. -CSF cell count, differential, protein and glucose, meningitis/encephalitis panel, VDRL CSF, IgG index, oligoclonal bands, CSF paraneoplastic panel, CSF cytology. -Further workup pending above. ______________________________________________________________________   Thank you for the opportunity to take part in the care of this patient. If you have any further questions, please contact the neurology  consultation attending.  Signed,  [place] Pager Number [phone] _ _ _   _ __   _ __ _ _  __ __   _ __   __ _

## 2022-08-20 ENCOUNTER — Observation Stay (HOSPITAL_COMMUNITY): Payer: Medicare Other

## 2022-08-20 DIAGNOSIS — Z951 Presence of aortocoronary bypass graft: Secondary | ICD-10-CM | POA: Diagnosis not present

## 2022-08-20 DIAGNOSIS — C7801 Secondary malignant neoplasm of right lung: Secondary | ICD-10-CM | POA: Diagnosis present

## 2022-08-20 DIAGNOSIS — Z87891 Personal history of nicotine dependence: Secondary | ICD-10-CM | POA: Diagnosis not present

## 2022-08-20 DIAGNOSIS — G4733 Obstructive sleep apnea (adult) (pediatric): Secondary | ICD-10-CM | POA: Diagnosis present

## 2022-08-20 DIAGNOSIS — C7972 Secondary malignant neoplasm of left adrenal gland: Secondary | ICD-10-CM | POA: Diagnosis not present

## 2022-08-20 DIAGNOSIS — D72828 Other elevated white blood cell count: Secondary | ICD-10-CM | POA: Diagnosis not present

## 2022-08-20 DIAGNOSIS — F0390 Unspecified dementia without behavioral disturbance: Secondary | ICD-10-CM | POA: Diagnosis present

## 2022-08-20 DIAGNOSIS — R531 Weakness: Secondary | ICD-10-CM | POA: Diagnosis present

## 2022-08-20 DIAGNOSIS — R270 Ataxia, unspecified: Secondary | ICD-10-CM | POA: Diagnosis present

## 2022-08-20 DIAGNOSIS — R260 Ataxic gait: Secondary | ICD-10-CM | POA: Diagnosis present

## 2022-08-20 DIAGNOSIS — C3490 Malignant neoplasm of unspecified part of unspecified bronchus or lung: Secondary | ICD-10-CM | POA: Diagnosis not present

## 2022-08-20 DIAGNOSIS — C7971 Secondary malignant neoplasm of right adrenal gland: Secondary | ICD-10-CM | POA: Diagnosis present

## 2022-08-20 DIAGNOSIS — I10 Essential (primary) hypertension: Secondary | ICD-10-CM | POA: Diagnosis present

## 2022-08-20 DIAGNOSIS — Z8249 Family history of ischemic heart disease and other diseases of the circulatory system: Secondary | ICD-10-CM | POA: Diagnosis not present

## 2022-08-20 DIAGNOSIS — I420 Dilated cardiomyopathy: Secondary | ICD-10-CM | POA: Diagnosis present

## 2022-08-20 DIAGNOSIS — Z955 Presence of coronary angioplasty implant and graft: Secondary | ICD-10-CM | POA: Diagnosis not present

## 2022-08-20 DIAGNOSIS — T380X5A Adverse effect of glucocorticoids and synthetic analogues, initial encounter: Secondary | ICD-10-CM | POA: Diagnosis not present

## 2022-08-20 DIAGNOSIS — Z801 Family history of malignant neoplasm of trachea, bronchus and lung: Secondary | ICD-10-CM | POA: Diagnosis not present

## 2022-08-20 DIAGNOSIS — Z86711 Personal history of pulmonary embolism: Secondary | ICD-10-CM | POA: Diagnosis not present

## 2022-08-20 DIAGNOSIS — Z818 Family history of other mental and behavioral disorders: Secondary | ICD-10-CM | POA: Diagnosis not present

## 2022-08-20 DIAGNOSIS — Z79899 Other long term (current) drug therapy: Secondary | ICD-10-CM | POA: Diagnosis not present

## 2022-08-20 DIAGNOSIS — C7951 Secondary malignant neoplasm of bone: Secondary | ICD-10-CM | POA: Diagnosis present

## 2022-08-20 DIAGNOSIS — C3412 Malignant neoplasm of upper lobe, left bronchus or lung: Secondary | ICD-10-CM | POA: Diagnosis not present

## 2022-08-20 DIAGNOSIS — E785 Hyperlipidemia, unspecified: Secondary | ICD-10-CM | POA: Diagnosis present

## 2022-08-20 DIAGNOSIS — N400 Enlarged prostate without lower urinary tract symptoms: Secondary | ICD-10-CM | POA: Diagnosis present

## 2022-08-20 DIAGNOSIS — Z7901 Long term (current) use of anticoagulants: Secondary | ICD-10-CM | POA: Diagnosis not present

## 2022-08-20 DIAGNOSIS — T451X5A Adverse effect of antineoplastic and immunosuppressive drugs, initial encounter: Secondary | ICD-10-CM | POA: Diagnosis present

## 2022-08-20 DIAGNOSIS — Z86718 Personal history of other venous thrombosis and embolism: Secondary | ICD-10-CM | POA: Diagnosis not present

## 2022-08-20 DIAGNOSIS — I2510 Atherosclerotic heart disease of native coronary artery without angina pectoris: Secondary | ICD-10-CM | POA: Diagnosis present

## 2022-08-20 DIAGNOSIS — G130 Paraneoplastic neuromyopathy and neuropathy: Secondary | ICD-10-CM | POA: Diagnosis present

## 2022-08-20 LAB — CBC
HCT: 30.5 % — ABNORMAL LOW (ref 39.0–52.0)
Hemoglobin: 10.1 g/dL — ABNORMAL LOW (ref 13.0–17.0)
MCH: 33.2 pg (ref 26.0–34.0)
MCHC: 33.1 g/dL (ref 30.0–36.0)
MCV: 100.3 fL — ABNORMAL HIGH (ref 80.0–100.0)
Platelets: 103 10*3/uL — ABNORMAL LOW (ref 150–400)
RBC: 3.04 MIL/uL — ABNORMAL LOW (ref 4.22–5.81)
RDW: 14.9 % (ref 11.5–15.5)
WBC: 6.1 10*3/uL (ref 4.0–10.5)
nRBC: 0 % (ref 0.0–0.2)

## 2022-08-20 LAB — BASIC METABOLIC PANEL
Anion gap: 11 (ref 5–15)
BUN: 22 mg/dL (ref 8–23)
CO2: 24 mmol/L (ref 22–32)
Calcium: 8.9 mg/dL (ref 8.9–10.3)
Chloride: 107 mmol/L (ref 98–111)
Creatinine, Ser: 1.14 mg/dL (ref 0.61–1.24)
GFR, Estimated: >60 mL/min (ref >60)
Glucose, Bld: 117 mg/dL — ABNORMAL HIGH (ref 70–99)
Potassium: 4.3 mmol/L (ref 3.5–5.1)
Sodium: 142 mmol/L (ref 135–145)

## 2022-08-20 LAB — TROPONIN I (HIGH SENSITIVITY): Troponin I (High Sensitivity): 35 ng/L — ABNORMAL HIGH (ref <18)

## 2022-08-20 LAB — FOLATE: Folate: >40 ng/mL (ref >5.9)

## 2022-08-20 LAB — HIV ANTIBODY (ROUTINE TESTING W REFLEX): HIV Screen 4th Generation wRfx: NONREACTIVE

## 2022-08-20 LAB — RPR: RPR Ser Ql: NONREACTIVE

## 2022-08-20 MED ORDER — ASPIRIN 81 MG PO TBEC
81.0000 mg | DELAYED_RELEASE_TABLET | Freq: Every day | ORAL | Status: AC
  Administered 2022-08-20: 81 mg via ORAL
  Filled 2022-08-20: qty 1

## 2022-08-20 MED ORDER — THIAMINE HCL 100 MG PO TABS
100.0000 mg | ORAL_TABLET | Freq: Every day | ORAL | Status: DC
  Administered 2022-08-20 – 2022-08-25 (×6): 100 mg via ORAL
  Filled 2022-08-20 (×12): qty 1

## 2022-08-20 MED ORDER — GADOBUTROL 1 MMOL/ML IV SOLN
8.0000 mL | Freq: Once | INTRAVENOUS | Status: AC | PRN
  Administered 2022-08-20: 8 mL via INTRAVENOUS

## 2022-08-20 MED ORDER — METOPROLOL SUCCINATE ER 25 MG PO TB24
12.5000 mg | ORAL_TABLET | Freq: Every day | ORAL | Status: DC
  Administered 2022-08-21 – 2022-08-24 (×2): 12.5 mg via ORAL
  Filled 2022-08-20 (×5): qty 1

## 2022-08-20 MED ORDER — THIAMINE HCL 100 MG/ML IJ SOLN
100.0000 mg | Freq: Every day | INTRAMUSCULAR | Status: DC
  Filled 2022-08-20: qty 2

## 2022-08-20 NOTE — Progress Notes (Addendum)
PROGRESS NOTE   John Hinton  RFX:[id number] DOB: 1945-09-18 DOA: 08/19/2022 PCP: [name], MD  Brief Narrative:  77 year old white male [place] stage IVa adenocarcinoma LUL status post resection 05/2020, + KRAS with mets to right lung adrenals and bones--follows with Dr. 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 Carles Collet neurology to be seen Patient was ultimately referred to the ED, neurology consulted  Hospital-Problem based course  Ataxia and incoordination - MRI brain negative for occult disease - Y64, folic acid within normal limits, HIV so far negative-await other labs - Anticipate LP once Eliquis washes out  Stage IVa adenocarcinoma -Chemotherapy on hold at this time -some case reports of this potentially being related to nivolumab, deferring to Dr. Antonieta Pert expertise -If workup does not reveal anything on labs as above + LP is negative then may require initiation of high-dose steroids -Continue at this time prednisone 10  Prior CABG-2007, DES 03/2015 CAD with dilated cardiomyopathy-last EF 50% 07/2022 -Would hold aspirin 81 from am of 08/21/22 as per Neuro - Can hold Zetia - Metoprolol held on admission, will resume at 12.5 XL dosing, continue Entresto twice daily  Pulmonary embolism 04/2021 - Requires reinitiation of Eliquis once LP is performed  DVT prophylaxis: SCD at this  time Code Status: Full code Family Communication: D/W Mechele Collin at [place] number [phone] Disposition:  Status is: Observation The patient will require care spanning > 2 midnights and should be moved to inpatient because:   Requires LP and workup    Subjective: Awake coherent pleasant no distress-when I go to shake his hand he is quite tremulous He is not having any specific visual changes but has not been up out of bed yet to determine if he is still feeling dizzy Wife supplements the history and tells me that when he walks at home he has been walking veering to the right  Objective: Vitals:   08/19/22 2030 08/19/22 2213 08/20/22 0400 08/20/22 0740  BP: 94/66 107/66 104/73 107/71  Pulse: 60 64 61 (!) 56  Resp: 16 17 16 17   Temp:  98.1 F (36.7 C) 98 F (36.7 C) 97.8 F (36.6 C)  TempSrc:  Oral Oral Oral  SpO2: 97% 99% 95% 98%  Weight:      Height:        Intake/Output Summary (Last 24 hours) at 08/20/2022 8088 Last data filed at 08/20/2022 0400 Gross per 24 hour  Intake 240 ml  Output 275 ml  Net -35 ml   Filed Weights   08/19/22 1417  Weight: 81.6 kg    Examination:  Awake coherent pleasant no distress Slightly tremulous-past-pointing on the left side is inaccurate-patient's past-pointing on the right side is accurate but he is tremulous when he does not Chest is clear no rebound no guarding Power is 5/5 to biceps triceps and lower extremities Sensory seems intact to my cursory exam Psych is euthymic coherent Abdomen is soft  no rebound no guarding   Data Reviewed: personally reviewed   CBC    Component Value Date/Time   WBC 6.1 08/20/2022 0410   RBC 3.04 (L) 08/20/2022 0410   HGB 10.1 (L) 08/20/2022 0410   HGB 11.5 (L) 08/17/2022 0842   HGB 13.7 01/27/2017 0834   HCT 30.5 (L) 08/20/2022 0410   HCT 39.2 01/27/2017 0834   PLT 103 (L) 08/20/2022 0410   PLT 106 (L) 08/17/2022 0842   PLT 165 01/27/2017 0834   MCV 100.3 (H) 08/20/2022  0410   MCV 98 (H) 01/27/2017 0834   MCH 33.2 08/20/2022 0410   MCHC 33.1 08/20/2022 0410   RDW 14.9 08/20/2022 0410   RDW 12.9 01/27/2017 0834   LYMPHSABS 0.3 (L) 08/19/2022 1438   LYMPHSABS 1.0 01/27/2017 0834   MONOABS 0.4 08/19/2022 1438   EOSABS 0.0 08/19/2022 1438   EOSABS 0.1 01/27/2017 0834   BASOSABS 0.0 08/19/2022 1438   BASOSABS 0.0 01/27/2017 0834      Latest Ref Rng & Units 08/20/2022    4:10 AM 08/19/2022    2:38 PM 08/17/2022    8:42 AM  CMP  Glucose 70 - 99 mg/dL 117  115  103   BUN 8 - 23 mg/dL 22  21  18    Creatinine 0.61 - 1.24 mg/dL 1.14  1.21  1.37   Sodium 135 - 145 mmol/L 142  143  147   Potassium 3.5 - 5.1 mmol/L 4.3  3.1  3.7   Chloride 98 - 111 mmol/L 107  108  109   CO2 22 - 32 mmol/L 24  27  28    Calcium 8.9 - 10.3 mg/dL 8.9  8.8  9.2   Total Protein 6.5 - 8.1 g/dL  6.7  6.8   Total Bilirubin 0.3 - 1.2 mg/dL  0.7  0.5   Alkaline Phos 38 - 126 U/L  40  38   AST 15 - 41 U/L  21  18   ALT 0 - 44 U/L  20  17      Radiology Studies: CT HEAD WO CONTRAST (5MM)  Result Date: 08/19/2022 CLINICAL DATA:  Weakness EXAM: CT HEAD WITHOUT CONTRAST TECHNIQUE: Contiguous axial images were obtained from the base of the skull through the vertex without intravenous contrast. RADIATION DOSE REDUCTION: This exam was performed according to the departmental dose-optimization program which includes automated exposure control, adjustment of the mA and/or kV according to patient size and/or use of iterative reconstruction technique. COMPARISON:  08/10/2022 FINDINGS: Brain: No evidence of acute infarction, hemorrhage, hydrocephalus, extra-axial collection or mass lesion/mass effect. Mild atrophic changes are noted. Vascular: No hyperdense vessel or unexpected calcification. Skull: Normal. Negative for fracture or focal lesion. Sinuses/Orbits: No acute finding. Other: None. IMPRESSION: No acute intracranial abnormality noted. Mild atrophic changes are noted. Electronically Signed    By: Inez Catalina M.D.   On: 08/19/2022 18:05   DG Chest Port 1 View  Result Date: 08/19/2022 CLINICAL DATA:  Shortness of breath. Worsening shortness of breath and weakness. EXAM: PORTABLE CHEST 1 VIEW COMPARISON:  Radiograph 2 days ago 08/09/2022. PET CT 08/17/2021 FINDINGS: Prior median sternotomy and CABG. Stable heart size and mediastinal contours. Postsurgical volume loss in the left hemithorax. Small left pleural effusion and basilar opacity is similar to recent imaging. Left pleural thickening was seen on recent PET. The right lung is clear. No pulmonary edema. No pneumothorax. IMPRESSION: 1. Stable radiographic appearance of the chest. Left pleural effusion/thickening and basilar opacity is similar  to recent imaging. 2. Prior CABG. Electronically Signed   By: Keith Rake M.D.   On: 08/19/2022 17:54     Scheduled Meds:  adagrasib  400 mg Oral BID   aspirin EC  81 mg Oral QHS   enoxaparin (LOVENOX) injection  40 mg Subcutaneous A63K   folic acid  1 mg Oral Daily   levothyroxine  50 mcg Oral QAC breakfast   metoprolol succinate  12.5 mg Oral QHS   multivitamin with minerals  1 tablet Oral Daily   predniSONE  10 mg Oral Q breakfast   rosuvastatin  40 mg Oral QHS   sacubitril-valsartan  1 tablet Oral BID   sertraline  100 mg Oral BID   thiamine (VITAMIN B1) injection  100 mg Intravenous Daily   Or   thiamine  100 mg Oral Daily   Continuous Infusions:   LOS: 0 days   Time spent: [place], MD [facility] To contact the attending provider between 7A-7P or the covering provider during after hours 7P-7A, please log into the web site [email or web address] and access using universal [place] password for that web site. If you do not have the password, please call the hospital operator.  08/20/2022, 8:06 AM

## 2022-08-20 NOTE — Progress Notes (Addendum)
NEUROLOGY CONSULTATION PROGRESS NOTE   Date of service: August 20, 2022 Patient Name: John Hinton MRN:  [id number] DOB:  [birth year]  Brief HPI  John Hinton is a 77 y.o. 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    Interval Hx   No change in exam.  Vitals   Vitals:   08/19/22 2213 08/20/22 0400 08/20/22 0740 08/20/22 1145  BP: 107/66 104/73 107/71 (!) 93/55  Pulse: 64 61 (!) 56 72  Resp: 17 16 17 17   Temp: 98.1 F (36.7 C) 98 F (36.7 C) 97.8 F (36.6 C) 98.1 F (36.7 C)  TempSrc: Oral Oral Oral Oral  SpO2: 99% 95% 98% 96%  Weight:      Height:         Body mass index is 25.1 kg/m.  Physical Exam   General: Laying comfortably in bed; in no acute distress.  HENT: Normal oropharynx and mucosa. Normal external appearance of ears and nose.  Neck: Supple, no pain or tenderness  CV: No JVD. No peripheral edema.  Pulmonary: Symmetric Chest rise. Normal respiratory effort.  Abdomen: Soft to touch, non-tender.  Ext: No cyanosis, edema, or deformity  Skin: No rash. Normal palpation of skin.   Musculoskeletal: Normal digits and nails by inspection. No clubbing.   Neurologic Examination  Mental status/Cognition: Alert, oriented to self, place, month and year, good attention.  Speech/language: Fluent, comprehension intact, object naming intact, repetition intact.  Cranial nerves:   CN II Pupils equal and reactive to light, no VF deficits    CN III,IV,VI EOM intact, no gaze preference or deviation, no nystagmus    CN V normal  sensation in V1, V2, and V3 segments bilaterally    CN VII no asymmetry, no nasolabial fold flattening    CN VIII normal hearing to speech    CN IX & X normal palatal elevation, no uvular deviation    CN XI 5/5 head turn and 5/5 shoulder shrug bilaterally    CN XII midline tongue protrusion    Motor:  Muscle bulk: normal, tone normal, pronator drift none tremor has an end intension tremor Mvmt Root Nerve  Muscle Right Left Comments  SA C5/6 Ax Deltoid 5 5   EF C5/6 Mc Biceps 5 5   EE C6/7/8 Rad Triceps 5 5   WF C6/7 Med FCR     WE C7/8 PIN ECU     F Ab C8/T1 U ADM/FDI 5 5   HF L1/2/3 Fem Illopsoas 5 5   KE L2/3/4 Fem Quad 5 5   DF L4/5 D Peron Tib Ant 5 5   PF S1/2 Tibial Grc/Sol 5 5     Sensation:  Light touch Intact   Pin prick    Temperature    Vibration Poor in b/l toes and limited in b/l knees  Proprioception    Coordination/Complex Motor:  - Finger to Nose past pointing - Heel to shin ataxia present - Gait: Stride length short. Arm swing poor. Base width wide  Labs   Basic Metabolic Panel:  Lab Results  Component Value Date   NA 142  08/20/2022   K 4.3 08/20/2022   CO2 24 08/20/2022   GLUCOSE 117 (H) 08/20/2022   BUN 22 08/20/2022   CREATININE 1.14 08/20/2022   CALCIUM 8.9 08/20/2022   GFRNONAA >60 08/20/2022   GFRAA 87 02/05/2020   HbA1c: No results found for: "HGBA1C" LDL:  Lab Results  Component Value Date   LDLCALC 77 03/03/2022   Urine Drug Screen: No results found for: "LABOPIA", "COCAINSCRNUR", "LABBENZ", "AMPHETMU", "THCU", "LABBARB"  Alcohol Level No results found for: "ETH" No results found for: "PHENYTOIN", "ZONISAMIDE", "LAMOTRIGINE", "LEVETIRACETA" No results found for: "PHENYTOIN", "PHENOBARB", "VALPROATE", "CBMZ"  Imaging and Diagnostic studies   CT Head without contrast(Personally reviewed): CTH was negative for a large hypodensity concerning for a large territory infarct or hyperdensity concerning for an ICH   MRI Brain  08/11/22(Personally reviewed): No acute intracranial process. No evidence of metastatic disease.    Impression   John Hinton is a 77 y.o. 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   Etiology is unclear, could be from nutritional, paraneoplastic/autoimmune(check point inhibitor induced?)/post infectious but no obvious cerebellitis noted on imaging, could be from potential leptomeningeal involvement of the cerebellum.  Recommendations  -D/c Eliquis and after 48 hours, we will attempt an LP. -CSF cell count, differential, protein and glucose, meningitis/encephalitis panel, VDRL CSF, IgG index, oligoclonal bands, CSF paraneoplastic panel, CSF cytology. -SPEP, copper, ceruloplasmin, vitamin E, celiac panel, zinc, vitamin B1,  Serum paraneoplastic antibody panel pending - MRI spine total mets screening to evaluate for any leptomeningeal involvement of the dorsal columns.  -Vitamin B12 WNL, folate WNL,  RPR WNL , HIV antibody WNL.  ______________________________________________________________________   Thank you for the opportunity to take part in the care of this patient. If you have any further questions, please contact the neurology consultation attending.  Signed,  Coralyn Pear, MD PGY-1 Psychiatry   NEUROHOSPITALIST ADDENDUM Performed a face to face diagnostic evaluation.   I have reviewed the contents of history and physical exam as documented by PA/ARNP/Resident and agree with above documentation.  I have discussed and formulated the above  plan as documented. Edits to the note have been made as needed.  Donnetta Simpers, MD [facility] [phone]   If 7pm to 7am, please call on call as listed on AMION.

## 2022-08-21 ENCOUNTER — Ambulatory Visit: Payer: Medicare Other | Admitting: Neurology

## 2022-08-21 DIAGNOSIS — R531 Weakness: Secondary | ICD-10-CM | POA: Diagnosis not present

## 2022-08-21 DIAGNOSIS — C3412 Malignant neoplasm of upper lobe, left bronchus or lung: Secondary | ICD-10-CM

## 2022-08-21 DIAGNOSIS — Z86718 Personal history of other venous thrombosis and embolism: Secondary | ICD-10-CM

## 2022-08-21 DIAGNOSIS — C7972 Secondary malignant neoplasm of left adrenal gland: Secondary | ICD-10-CM

## 2022-08-21 DIAGNOSIS — C7971 Secondary malignant neoplasm of right adrenal gland: Secondary | ICD-10-CM

## 2022-08-21 DIAGNOSIS — C7951 Secondary malignant neoplasm of bone: Secondary | ICD-10-CM

## 2022-08-21 DIAGNOSIS — Z7901 Long term (current) use of anticoagulants: Secondary | ICD-10-CM

## 2022-08-21 DIAGNOSIS — R270 Ataxia, unspecified: Secondary | ICD-10-CM | POA: Diagnosis not present

## 2022-08-21 DIAGNOSIS — N400 Enlarged prostate without lower urinary tract symptoms: Secondary | ICD-10-CM

## 2022-08-21 DIAGNOSIS — Z79899 Other long term (current) drug therapy: Secondary | ICD-10-CM

## 2022-08-21 LAB — CBC WITH DIFFERENTIAL/PLATELET
Abs Immature Granulocytes: 0.02 10*3/uL (ref 0.00–0.07)
Basophils Absolute: 0 10*3/uL (ref 0.0–0.1)
Basophils Relative: 0 %
Eosinophils Absolute: 0 10*3/uL (ref 0.0–0.5)
Eosinophils Relative: 0 %
HCT: 30.4 % — ABNORMAL LOW (ref 39.0–52.0)
Hemoglobin: 9.8 g/dL — ABNORMAL LOW (ref 13.0–17.0)
Immature Granulocytes: 0 %
Lymphocytes Relative: 9 %
Lymphs Abs: 0.5 10*3/uL — ABNORMAL LOW (ref 0.7–4.0)
MCH: 33 pg (ref 26.0–34.0)
MCHC: 32.2 g/dL (ref 30.0–36.0)
MCV: 102.4 fL — ABNORMAL HIGH (ref 80.0–100.0)
Monocytes Absolute: 0.6 10*3/uL (ref 0.1–1.0)
Monocytes Relative: 11 %
Neutro Abs: 4.5 10*3/uL (ref 1.7–7.7)
Neutrophils Relative %: 80 %
Platelets: 106 10*3/uL — ABNORMAL LOW (ref 150–400)
RBC: 2.97 MIL/uL — ABNORMAL LOW (ref 4.22–5.81)
RDW: 14.7 % (ref 11.5–15.5)
WBC: 5.6 10*3/uL (ref 4.0–10.5)
nRBC: 0 % (ref 0.0–0.2)

## 2022-08-21 LAB — MENINGITIS/ENCEPHALITIS PANEL (CSF)

## 2022-08-21 LAB — CSF CELL COUNT WITH DIFFERENTIAL
RBC Count, CSF: 19 /mm3 — ABNORMAL HIGH
RBC Count, CSF: 1 /mm3 — ABNORMAL HIGH
Tube #: 1
Tube #: 4
WBC, CSF: 1 /mm3 (ref 0–5)
WBC, CSF: 1 /mm3 (ref 0–5)

## 2022-08-21 LAB — PROTEIN AND GLUCOSE, CSF
Glucose, CSF: 64 mg/dL (ref 40–70)
Total  Protein, CSF: 25 mg/dL (ref 15–45)

## 2022-08-21 MED ORDER — PANTOPRAZOLE SODIUM 40 MG IV SOLR
40.0000 mg | INTRAVENOUS | Status: DC
  Administered 2022-08-21: 40 mg via INTRAVENOUS
  Filled 2022-08-21: qty 10

## 2022-08-21 MED ORDER — SODIUM CHLORIDE 0.9 % IV SOLN
1000.0000 mg | INTRAVENOUS | Status: DC
  Administered 2022-08-21 – 2022-08-22 (×2): 1000 mg via INTRAVENOUS
  Filled 2022-08-21 (×3): qty 16

## 2022-08-21 NOTE — Progress Notes (Addendum)
NEUROLOGY CONSULTATION PROGRESS NOTE   Date of service: August 21, 2022 Patient Name: John Hinton MRN:  [id number] DOB:  1946/01/19  Brief HPI  John Hinton is a 77 y.o. 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    Interval Hx   Patient was seen this morning. He reports feeling tired and having difficulty walking from his bed to the bathroom. No change in exam today.  Vitals   Vitals:   08/20/22 1950 08/21/22 0007 08/21/22 0349 08/21/22 0732  BP: (!) 101/56 97/65 96/64  110/63  Pulse: 62 (!) 59 (!) 59 61  Resp: 18 17 17 18   Temp: 98.8 F (37.1 C) 97.7 F (36.5 C) 98.1 F (36.7 C) 97.8 F (36.6 C)  TempSrc: Oral Oral Oral Oral  SpO2: 97% 97% 98% 98%  Weight:      Height:         Body mass index is 25.1 kg/m.  Physical Exam   General: Laying comfortably in bed; in no acute distress.  HENT: Normal oropharynx and mucosa. Normal external appearance of ears and nose.  Neck: Supple, no pain or tenderness  CV: No JVD. No peripheral edema.  Pulmonary: Symmetric Chest rise. Normal respiratory effort.  Abdomen: Soft to touch, non-tender.  Ext: No cyanosis, edema, or deformity  Skin: No rash. Normal palpation of skin.   Musculoskeletal: Normal digits and nails by inspection. No clubbing.    Neurologic Examination  Mental Status -  Level of arousal and orientation to time, place, and person were intact. Language including expression, naming, repetition, comprehension was assessed and found intact. Fund of Knowledge was assessed and  was intact.   Cranial Nerves II - XII - II - Visual field intact OU. III, IV, VI - Extraocular movements intact. V - Facial sensation intact bilaterally. VII - Facial movement intact bilaterally. VIII - Hearing & vestibular intact bilaterally. X - Palate elevates symmetrically. XI - Chin turning & shoulder shrug intact bilaterally. XII - Tongue protrusion intact.   Motor:  Muscle bulk: normal, tone normal, pronator drift none tremor has an end intension tremor Mvmt Root Nerve  Muscle Right Left Comments  SA C5/6 Ax Deltoid 5 5   EF C5/6 Mc Biceps 5 5   EE C6/7/8 Rad Triceps 5 5   WF C6/7 Med FCR     WE C7/8 PIN ECU     F Ab C8/T1 U ADM/FDI 5 5   HF L1/2/3 Fem Illopsoas 5 5   KE L2/3/4 Fem Quad 5 5   DF L4/5 D Peron Tib Ant 5 5   PF S1/2 Tibial Grc/Sol 5 5     Sensation:  Light touch Limited on the right foot   Pin prick    Temperature    Vibration Poor in b/l toes and limited in b/l knees  Proprioception    Coordination/Complex Motor:  - Finger to Nose past pointing - Heel to shin ataxia present - Gait: Stride length short. Arm swing poor. Base width wide  Labs   Basic Metabolic Panel:  Lab Results  Component Value Date   NA 142 08/20/2022  K 4.3 08/20/2022   CO2 24 08/20/2022   GLUCOSE 117 (H) 08/20/2022   BUN 22 08/20/2022   CREATININE 1.14 08/20/2022   CALCIUM 8.9 08/20/2022   GFRNONAA >60 08/20/2022   GFRAA 87 02/05/2020   HbA1c: No results found for: "HGBA1C" LDL:  Lab Results  Component Value Date   LDLCALC 77 03/03/2022   Urine Drug Screen: No results found for: "LABOPIA", "COCAINSCRNUR", "LABBENZ", "AMPHETMU", "THCU", "LABBARB"  Alcohol Level No results found for: "ETH" No results found for: "PHENYTOIN", "ZONISAMIDE", "LAMOTRIGINE", "LEVETIRACETA" No results found for: "PHENYTOIN", "PHENOBARB", "VALPROATE", "CBMZ"  Imaging and Diagnostic studies   CT Head without contrast(Personally reviewed): CTH was negative for a large hypodensity  concerning for a large territory infarct or hyperdensity concerning for an ICH   MRI Brain 08/11/22(Personally reviewed): No acute intracranial process. No evidence of metastatic disease.   MRI total spine 08/20/22: IMPRESSION: 1. Redemonstrated metastatic disease in T1 and T10. No significant epidural extension of tumor. 2. Possible abnormal foci in the C7 vertebral body, but no evidence of epidural extension of tumor. 3. No abnormal enhancement in the spinal cord or cauda equina, although evaluation of the cauda equina is somewhat limited by the large field of view. 4. No significant spinal canal stenosis in the spine.  Impression   John Hinton is a 77 y.o. 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    Patient's ataxia could be from nutritional, paraneoplastic/autoimmune(check point inhibitor induced?)/post infectious but no obvious cerebellitis noted on imaging, could also be from potential leptomeningeal involvement of the cerebellum.  Will continue to obtain further workup to determine the etiology.  Recommendations  -Has been off eliquis for about 48 hours now, will plan LP today -CSF cell count, differential, protein and glucose, meningitis/encephalitis panel, VDRL CSF, IgG index, oligoclonal bands, CSF paraneoplastic panel, CSF cytology. -SPEP, copper, ceruloplasmin, vitamin E, celiac panel, zinc, vitamin B1,  Serum paraneoplastic antibody panel pending - no Spine mets or leptomeningeal involvement.  -Vitamin B12 WNL, folate WNL,  RPR WNL  , HIV antibody WNL.  ______________________________________________________________________   Thank you for the opportunity to take part in the care of this patient. If you have any further questions, please contact the neurology consultation attending.  Signed,  Coralyn Pear, MD PGY-1 Psychiatry  NEUROHOSPITALIST ADDENDUM Performed a face to face diagnostic evaluation.   I have reviewed the contents of history and physical exam as documented by PA/ARNP/Resident and agree with above documentation.  I have discussed and formulated the above plan as documented. Edits to the note have been made as needed.  Impression:  Key exam findings: Plan:  Donnetta Simpers, MD [facility] [phone]   If 7pm to 7am, please call on call as listed on AMION.

## 2022-08-21 NOTE — Procedures (Signed)
Indication: Obtaining CSF specimen for labs and cytology  Risks of the procedure were dicussed with the patient including post-LP headache, bleeding, infection, weakness/numbness of legs(radiculopathy), death.  The patient/patient's proxy agreed and written consent was obtained.   The patient was prepped and draped, and using sterile technique a 20 gauge quinke spinal needle was inserted in the L3-L4 space. The opening pressure was 18 cm h20. Approximately 22 cc of CSF were obtained and sent for analysis.

## 2022-08-21 NOTE — Progress Notes (Signed)
Preliminary CSF studies with no pleocytosis, no elevated protein, meningitis/encephalitis biofire panel is negative, gram stain with no organisms, no WBC.  Will start him on IV solumedrol 1000 daily x 5 doses, further steroids based on response. Protonix while on steroids.  Discussed with patient's wife over phone. Primary team notified via secure chat.  [place] Pager Number [phone]

## 2022-08-21 NOTE — Plan of Care (Signed)

## 2022-08-21 NOTE — Plan of Care (Signed)
  Problem: Education: Goal: Knowledge of General Education information will improve Description: Including pain rating scale, medication(s)/side effects and non-pharmacologic comfort measures Outcome: Progressing   Problem: Clinical Measurements: Goal: Will remain free from infection Outcome: Progressing   Problem: Activity: Goal: Risk for activity intolerance will decrease Outcome: Progressing   Problem: Coping: Goal: Level of anxiety will decrease Outcome: Progressing   Problem: Safety: Goal: Ability to remain free from injury will improve Outcome: Progressing   Problem: Skin Integrity: Goal: Risk for impaired skin integrity will decrease Outcome: Progressing

## 2022-08-21 NOTE — Consult Note (Signed)
John Hinton is well-known to me.  He is a 77 year old white male.  He has history of stage IV adenocarcinoma of the lung.  He has systemic metastasis.  He also has history of pulmonary emboli.  He has been on systemic chemotherapy.  He progressed.  He does have the KRAS mutation.  We currently have him on adagrasib.  We increase his dose up to 40 mg p.o. twice daily about a month ago.  He has been having progressive neurological issues.  We did give him 1 dose of nivolumab.  Of note, we initially saw him, he had chemotherapy and immuno therapy.  He have seen and had no problems with the immunotherapy.  Again, he has been having progressive neurological issues.  He has been more unsteady.  He has gotten weaker in the legs.  He did have MRI of the spine yesterday.  This did not show any obvious cord compression.  I think he has had some involvement at T1 and T10.  He has had radiation to these areas.  He was admitted on 08/19/2022.  He is being evaluated by neurology.  He has had MRI of the brain.  This was negative for any obvious metastatic disease.  He is going for a lumbar puncture today.  1 possibility I thought that he might have is a paraneoplastic process.  Neurology is is helping to evaluate this.  I suppose this neurological deficit might be related to the nivolumab.  Again, he has had pembrolizumab in the past without any issues.  His appetite is okay.  He has had no nausea or vomiting.  He has had no rashes.  There is been no bowel or bladder incontinence.  He has had no cough.  He denies any count of headache.  There is no visual changes.  His labs show sodium 142.  Potassium 4.3.  BUN 22 creatinine 1.14.  Calcium 8.9 with an albumin of 4.0.  His white cell count is 5.6.  Hemoglobin 9.8.  Platelet count 106,000.  We have stopped the adagrasib for right now.  I cannot find anything that shows that adagrasib has been involved with neurotoxicity.  Cognitively, he seems to be pretty  intact.  No so I am.  He answers questions properly.   On physical exam, his temperature is 97.8.  Pulse 61.  Blood pressure 110/63.  Oxygen saturation is 98%.  His head and neck exam shows no ocular or oral lesions.  He has no adenopathy in the neck.  Lungs are clear.  Cardiac exam regular rate and rhythm.  Abdomen is soft.  Bowel sounds are present.  He has no fluid wave.  There is no guarding or rebound tenderness.  His extremities shows decent strength in upper and lower extremities.  He does have a tremor over on the right foot and actually right hand.  Skin exam shows no rashes.  Neurological exam is as detailed by the neurologist.   John Hinton is a very nice 77 year old male.  He has metastatic adenocarcinoma of the lung.  Currently, he is on adagrasib.  We gave him 1 dose of nivolumab.  He has progressive neurological deficits.  I think the lumbar puncture will be critical.  Again, I have to worry about the possibility of a paraneoplastic syndrome.  The problem is that the test would not probably be back for about 2 or 3 weeks.  I suppose this could be from the immunotherapy.  If so, we could certainly get him on  some steroids and see if this may help.  I just want his quality of life to be better.  Everything that we are doing at this point for his cancer is reflective of his quality of life.  I want him to maintain a good quality of life.  He is always been active.  I know that he will get incredible care from everybody on [address].  We will continue to follow along.  Lattie Haw, MD  Deuteronomy 31:8

## 2022-08-22 DIAGNOSIS — R270 Ataxia, unspecified: Secondary | ICD-10-CM | POA: Diagnosis not present

## 2022-08-22 DIAGNOSIS — C7971 Secondary malignant neoplasm of right adrenal gland: Secondary | ICD-10-CM | POA: Diagnosis not present

## 2022-08-22 DIAGNOSIS — R531 Weakness: Secondary | ICD-10-CM | POA: Diagnosis not present

## 2022-08-22 DIAGNOSIS — C3412 Malignant neoplasm of upper lobe, left bronchus or lung: Secondary | ICD-10-CM | POA: Diagnosis not present

## 2022-08-22 LAB — CERULOPLASMIN: Ceruloplasmin: 20.4 mg/dL (ref 16.0–31.0)

## 2022-08-22 LAB — TISSUE TRANSGLUTAMINASE, IGA: Tissue Transglutaminase Ab, IgA: <2 U/mL (ref 0–3)

## 2022-08-22 LAB — RETICULIN ANTIBODIES, IGA W TITER: Reticulin Ab, IgA: NEGATIVE titer (ref <2.5)

## 2022-08-22 LAB — HSV 1/2 PCR, CSF
HSV-1 DNA: NEGATIVE
HSV-2 DNA: NEGATIVE

## 2022-08-22 MED ORDER — PANTOPRAZOLE SODIUM 40 MG PO TBEC
40.0000 mg | DELAYED_RELEASE_TABLET | Freq: Every day | ORAL | Status: DC
  Administered 2022-08-22 – 2022-08-25 (×4): 40 mg via ORAL
  Filled 2022-08-22 (×4): qty 1

## 2022-08-22 MED ORDER — FLUCONAZOLE 100 MG PO TABS
100.0000 mg | ORAL_TABLET | Freq: Every day | ORAL | Status: DC
  Administered 2022-08-22 – 2022-08-25 (×4): 100 mg via ORAL
  Filled 2022-08-22 (×4): qty 1

## 2022-08-22 MED ORDER — APIXABAN 5 MG PO TABS
5.0000 mg | ORAL_TABLET | Freq: Two times a day (BID) | ORAL | Status: DC
  Administered 2022-08-22 – 2022-08-25 (×7): 5 mg via ORAL
  Filled 2022-08-22 (×7): qty 1

## 2022-08-22 NOTE — Plan of Care (Signed)
  Problem: Education: Goal: Knowledge of General Education information will improve Description: Including pain rating scale, medication(s)/side effects and non-pharmacologic comfort measures 08/22/2022 0914 by Andrey Campanile, RN Outcome: Progressing 08/22/2022 0914 by Andrey Campanile, RN Outcome: Progressing   Problem: Health Behavior/Discharge Planning: Goal: Ability to manage health-related needs will improve 08/22/2022 0914 by Andrey Campanile, RN Outcome: Progressing 08/22/2022 0914 by Andrey Campanile, RN Outcome: Progressing   Problem: Clinical Measurements: Goal: Ability to maintain clinical measurements within normal limits will improve 08/22/2022 0914 by Andrey Campanile, RN Outcome: Progressing 08/22/2022 0914 by Andrey Campanile, RN Outcome: Progressing Goal: Will remain free from infection 08/22/2022 0914 by Andrey Campanile, RN Outcome: Progressing 08/22/2022 0914 by Andrey Campanile, RN Outcome: Progressing Goal: Diagnostic test results will improve 08/22/2022 0914 by Andrey Campanile, RN Outcome: Progressing 08/22/2022 0914 by Andrey Campanile, RN Outcome: Progressing Goal: Respiratory complications will improve 08/22/2022 0914 by Andrey Campanile, RN Outcome: Progressing 08/22/2022 0914 by Andrey Campanile, RN Outcome: Progressing Goal: Cardiovascular complication will be avoided 08/22/2022 0914 by Andrey Campanile, RN Outcome: Progressing 08/22/2022 0914 by Andrey Campanile, RN Outcome: Progressing   Problem: Activity: Goal: Risk for activity intolerance will decrease 08/22/2022 0914 by Andrey Campanile, RN Outcome: Progressing 08/22/2022 0914 by Andrey Campanile, RN Outcome: Progressing   Problem: Nutrition: Goal: Adequate nutrition will be maintained 08/22/2022 0914 by Andrey Campanile, RN Outcome: Progressing 08/22/2022 0914 by Andrey Campanile, RN Outcome: Progressing   Problem: Coping: Goal: Level of anxiety will decrease 08/22/2022 0914 by Andrey Campanile, RN Outcome: Progressing 08/22/2022 0914 by Andrey Campanile, RN Outcome: Progressing   Problem: Elimination: Goal: Will not experience complications related to bowel motility 08/22/2022 0914 by Andrey Campanile, RN Outcome: Progressing 08/22/2022 0914 by Andrey Campanile, RN Outcome: Progressing Goal: Will not experience complications related to urinary retention 08/22/2022 0914 by Andrey Campanile, RN Outcome: Progressing 08/22/2022 0914 by Andrey Campanile, RN Outcome: Progressing   Problem: Pain Managment: Goal: General experience of comfort will improve 08/22/2022 0914 by Andrey Campanile, RN Outcome: Progressing 08/22/2022 0914 by Andrey Campanile, RN Outcome: Progressing   Problem: Safety: Goal: Ability to remain free from injury will improve 08/22/2022 0914 by Andrey Campanile, RN Outcome: Progressing 08/22/2022 0914 by Andrey Campanile, RN Outcome: Progressing   Problem: Skin Integrity: Goal: Risk for impaired skin integrity will decrease 08/22/2022 0914 by Andrey Campanile, RN Outcome: Progressing 08/22/2022 0914 by Andrey Campanile, RN Outcome: Progressing

## 2022-08-22 NOTE — Evaluation (Addendum)
Physical Therapy Evaluation Patient Details Name: John Hinton MRN: [id number] DOB: June 21, 1946 Today's Date: 08/22/2022  History of Present Illness  Pt is a 77 y.o. male admitted 08/19/22 with new onset weakness, confusion, and ataxia. CTH and MRI negative. LP negative. Pt started on IV solumedrol. PMH:  stage 4 adenocarcinoma of lung with mets to lungs, B adrenal glands, bone.  Prior PE without RHS on chronic eliquis, BPH, HTN, dilated cardiomyopathy.   Clinical Impression  Pt admitted with above diagnosis. PTA pt lived at home with wife, active, independent, and driving. Pt currently with functional limitations due to the deficits listed below (see PT Problem List). On eval, pt required min assist bed mobility, min assist sit to stand with RW, and mod assist amb in room with RW. Poor balance in sitting and standing, with posterior lean. Ataxic gait. 2/4 DOE. SpO2 95% on RA. Pt reports dizziness with all mobility. Pt will benefit from skilled PT to increase their independence and safety with mobility to allow discharge to the venue listed below.          Recommendations for follow up therapy are one component of a multi-disciplinary discharge planning process, led by the attending physician.  Recommendations may be updated based on patient status, additional functional criteria and insurance authorization.  Follow Up Recommendations Acute inpatient rehab (3hours/day)      Assistance Recommended at Discharge Frequent or constant Supervision/Assistance  Patient can return home with the following  Assist for transportation;Help with stairs or ramp for entrance;A lot of help with bathing/dressing/bathroom;A lot of help with walking and/or transfers;Assistance with cooking/housework;Direct supervision/assist for medications management    Equipment Recommendations Rolling walker (2 wheels);BSC/3in1  Recommendations for Other Services  Rehab consult    Functional Status Assessment Patient has  had a recent decline in their functional status and demonstrates the ability to make significant improvements in function in a reasonable and predictable amount of time.     Precautions / Restrictions Precautions Precautions: Fall      Mobility  Bed Mobility Overal bed mobility: Needs Assistance Bed Mobility: Supine to Sit, Sit to Supine     Supine to sit: Min assist Sit to supine: Min guard   General bed mobility comments: assist with trunk due to posterior lean    Transfers Overall transfer level: Needs assistance Equipment used: Rolling walker (2 wheels) Transfers: Sit to/from Stand Sit to Stand: Min assist           General transfer comment: assist to power up and stabilize balance. Posterior lean. Difficulty finding midline.    Ambulation/Gait Ambulation/Gait assistance: Mod assist Gait Distance (Feet): 25 Feet Assistive device: Rolling walker (2 wheels) Gait Pattern/deviations: Step-through pattern, Decreased stride length, Wide base of support, Ataxic Gait velocity: decreased     General Gait Details: assist to manage RW and maintain balance. 2/4 DOE. SpO2 95% on RA. HR in 70s  Stairs Stairs:  (able to step over lip of shower in bathroom mod assist using RW and grab bar for support.)          Wheelchair Mobility    Modified Rankin (Stroke Patients Only)       Balance Overall balance assessment: Needs assistance Sitting-balance support: Bilateral upper extremity supported, Feet supported Sitting balance-Leahy Scale: Poor Sitting balance - Comments: difficulty finding midline. Drifts posteriorly. Postural control: Posterior lean Standing balance support: Reliant on assistive device for balance, During functional activity, Bilateral upper extremity supported Standing balance-Leahy Scale: Poor  Pertinent Vitals/Pain Pain Assessment Pain Assessment: No/denies pain    Home Living Family/patient expects  to be discharged to:: Private residence Living Arrangements: Spouse/significant other Available Help at Discharge: Family;Available 24 hours/day Type of Home: House Home Access: Stairs to enter Entrance Stairs-Rails: None Entrance Stairs-Number of Steps: 4 Alternate Level Stairs-Number of Steps: Original log cabin house with multiple additions. One step to transition between each addition. Home Layout: Multi-level Home Equipment: None      Prior Function Prior Level of Function : Independent/Modified Independent;Driving                     Hand Dominance   Dominant Hand: Right    Extremity/Trunk Assessment   Upper Extremity Assessment Upper Extremity Assessment:  (ataxic movement BUE/LE (LE>UE))    Lower Extremity Assessment Lower Extremity Assessment: RLE deficits/detail;LLE deficits/detail RLE Deficits / Details: 5/5 strength, ataxic movement RLE Sensation: decreased proprioception RLE Coordination: decreased gross motor;decreased fine motor LLE Deficits / Details: 5/5 strength, ataxic movement LLE Sensation: decreased proprioception LLE Coordination: decreased fine motor;decreased gross motor    Cervical / Trunk Assessment Cervical / Trunk Assessment: Normal  Communication   Communication: No difficulties  Cognition Arousal/Alertness: Awake/alert Behavior During Therapy: WFL for tasks assessed/performed Overall Cognitive Status: Impaired/Different from baseline Area of Impairment: Attention, Memory, Safety/judgement, Awareness, Problem solving                   Current Attention Level: Selective Memory: Decreased short-term memory   Safety/Judgement: Decreased awareness of safety Awareness: Emergent Problem Solving: Slow processing, Difficulty sequencing, Requires verbal cues General Comments: A&O x 4. Able to solve simple math equations. Unable to name months of the year backwards. Pt asked to remember an address, but unable to even repeat the  address back to therapist at beginning of activity.        General Comments General comments (skin integrity, edema, etc.): SpO2 95% on RA. 2/4 DOE with activity. HR in 70s. Pt reporting dizziness/vertigo with mobility.    Exercises     Assessment/Plan    PT Assessment Patient needs continued PT services  PT Problem List Decreased balance;Decreased cognition;Decreased knowledge of precautions;Decreased mobility;Decreased knowledge of use of DME;Decreased activity tolerance;Decreased coordination;Decreased safety awareness       PT Treatment Interventions DME instruction;Functional mobility training;Balance training;Patient/family education;Gait training;Therapeutic activities;Neuromuscular re-education;Therapeutic exercise;Stair training;Cognitive remediation    PT Goals (Current goals can be found in the Care Plan section)  Acute Rehab PT Goals Patient Stated Goal: home PT Goal Formulation: With patient/family Time For Goal Achievement: 09/05/22 Potential to Achieve Goals: Good    Frequency Min 4X/week     Co-evaluation               AM-PAC PT "6 Clicks" Mobility  Outcome Measure Help needed turning from your back to your side while in a flat bed without using bedrails?: None Help needed moving from lying on your back to sitting on the side of a flat bed without using bedrails?: A Little Help needed moving to and from a bed to a chair (including a wheelchair)?: A Little Help needed standing up from a chair using your arms (e.g., wheelchair or bedside chair)?: A Little Help needed to walk in hospital room?: A Lot Help needed climbing 3-5 steps with a railing? : Total 6 Click Score: 16    End of Session Equipment Utilized During Treatment: Gait belt Activity Tolerance: Patient tolerated treatment well Patient left: in bed;with call bell/phone within reach;with family/visitor  present Nurse Communication: Mobility status PT Visit Diagnosis: Unsteadiness on feet  (R26.81);Ataxic gait (R26.0)    Time: 0865-7846 PT Time Calculation (min) (ACUTE ONLY): 34 min   Charges:   PT Evaluation $PT Eval Moderate Complexity: 1 Mod PT Treatments $Gait Training: 8-22 mins        Gloriann Loan., PT  Office # [phone]   Lorriane Shire 08/22/2022, 11:20 AM

## 2022-08-22 NOTE — Progress Notes (Signed)
Note from 2/1 was inadvertently overwritten- It is as below   Edited by Nita Sells, MD, 08/20/2022  9:29 AM    Nita Sells, MD Physician Hospitalist   Progress Notes    Signed   Date of Service: 08/20/2022  8:06 AM      Expand All Collapse All   PROGRESS NOTE     John Hinton         ZOX:[id number] DOB: 12-17-1945 DOA: 08/19/2022 PCP: [name], MD            Brief Narrative:  77 year old white male community dwelling stage IVa adenocarcinoma LUL status post resection 05/2020, + KRAS with mets to right lung adrenals and bones--follows with Dr. 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 Carles Collet neurology to be seen Patient was ultimately referred to the ED, neurology consulted   Hospital-Problem based course   Ataxia and incoordination - MRI brain negative for occult disease - W11, folic acid within normal limits, HIV so far negative-await other labs - Anticipate LP once Eliquis washes out   Stage IVa adenocarcinoma -Chemotherapy on hold at this time -some case reports of this potentially being related to nivolumab, deferring to Dr. Antonieta Pert expertise -If workup does not reveal anything on labs as above + LP is negative then may require initiation of high-dose steroids -Continue at this time prednisone 10   Prior CABG-2007, DES 03/2015 CAD with dilated cardiomyopathy-last EF  50% 07/2022 -Would hold aspirin 81 at this time - Can hold Zetia - Metoprolol held on admission, will resume at 12.5 XL dosing, continue Entresto twice daily   Pulmonary embolism 04/2021 - Requires reinitiation of Eliquis once LP is performed   DVT prophylaxis: SCD at this time Code Status: Full code Family Communication: D/W Mechele Collin at [place] number [phone] Disposition:  Status is: Observation The patient will require care spanning > 2 midnights and should be moved to inpatient because:    Requires LP and workup     Subjective: Awake coherent pleasant no distress-when I go to shake his hand he is quite tremulous He is not having any specific visual changes but has not been up out of bed yet to determine if he is still feeling dizzy Wife supplements the history and tells me that when he walks at home he has been walking veering to the right   Objective:       Vitals:    08/19/22 2030 08/19/22 2213 08/20/22 0400 08/20/22 0740  BP: 94/66 107/66 104/73 107/71  Pulse: 60 64 61 (!) 56  Resp: 16 17 16 17   Temp:   98.1 F (36.7 C) 98 F (36.7 C) 97.8 F (36.6 C)  TempSrc:   Oral Oral Oral  SpO2: 97% 99% 95% 98%  Weight:          Height:              Intake/Output Summary (Last 24  hours) at 08/20/2022 0806 Last data filed at 08/20/2022 0400    Gross per 24 hour  Intake 240 ml  Output 275 ml  Net -35 ml       Filed Weights    08/19/22 1417  Weight: 81.6 kg      Examination:   Awake coherent pleasant no distress Slightly tremulous-past-pointing on the left side is inaccurate-patient's past-pointing on the right side is accurate but he is tremulous when he does not Chest is clear no rebound no guarding Power is 5/5 to biceps triceps and lower extremities Sensory seems intact to my cursory exam Psych is euthymic coherent Abdomen is soft no rebound no guarding     Data Reviewed: personally reviewed    CBC Labs (Brief)          Component Value  Date/Time    WBC 6.1 08/20/2022 0410    RBC 3.04 (L) 08/20/2022 0410    HGB 10.1 (L) 08/20/2022 0410    HGB 11.5 (L) 08/17/2022 0842    HGB 13.7 01/27/2017 0834    HCT 30.5 (L) 08/20/2022 0410    HCT 39.2 01/27/2017 0834    PLT 103 (L) 08/20/2022 0410    PLT 106 (L) 08/17/2022 0842    PLT 165 01/27/2017 0834    MCV 100.3 (H) 08/20/2022 0410    MCV 98 (H) 01/27/2017 0834    MCH 33.2 08/20/2022 0410    MCHC 33.1 08/20/2022 0410    RDW 14.9 08/20/2022 0410    RDW 12.9 01/27/2017 0834    LYMPHSABS 0.3 (L) 08/19/2022 1438    LYMPHSABS 1.0 01/27/2017 0834    MONOABS 0.4 08/19/2022 1438    EOSABS 0.0 08/19/2022 1438    EOSABS 0.1 01/27/2017 0834    BASOSABS 0.0 08/19/2022 1438    BASOSABS 0.0 01/27/2017 0834          Latest Ref Rng & Units 08/20/2022    4:10 AM 08/19/2022    2:38 PM 08/17/2022    8:42 AM  CMP  Glucose 70 - 99 mg/dL 117  115  103   BUN 8 - 23 mg/dL 22  21  18    Creatinine 0.61 - 1.24 mg/dL 1.14  1.21  1.37   Sodium 135 - 145 mmol/L 142  143  147   Potassium 3.5 - 5.1 mmol/L 4.3  3.1  3.7   Chloride 98 - 111 mmol/L 107  108  109   CO2 22 - 32 mmol/L 24  27  28    Calcium 8.9 - 10.3 mg/dL 8.9  8.8  9.2   Total Protein 6.5 - 8.1 g/dL   6.7  6.8   Total Bilirubin 0.3 - 1.2 mg/dL   0.7  0.5   Alkaline Phos 38 - 126 U/L   40  38   AST 15 - 41 U/L   21  18   ALT 0 - 44 U/L   20  17         Radiology Studies:  Imaging Results (Last 48 hours)  CT HEAD WO CONTRAST (5MM)   Result Date: 08/19/2022 CLINICAL DATA:  Weakness EXAM: CT HEAD WITHOUT CONTRAST TECHNIQUE: Contiguous axial images were obtained from the base of the skull through the vertex without intravenous contrast. RADIATION DOSE REDUCTION: This exam was performed according to the departmental dose-optimization program which includes automated exposure control, adjustment of the mA and/or kV according to patient size and/or use of iterative reconstruction technique. COMPARISON:  08/10/2022 FINDINGS: Brain: No  evidence of acute infarction, hemorrhage, hydrocephalus, extra-axial collection or mass lesion/mass effect. Mild atrophic changes are noted. Vascular: No hyperdense vessel or unexpected calcification. Skull: Normal. Negative for fracture or focal lesion. Sinuses/Orbits: No acute finding. Other: None. IMPRESSION: No acute intracranial abnormality noted. Mild atrophic changes are noted. Electronically Signed   By: Inez Catalina M.D.   On: 08/19/2022 18:05    DG Chest Port 1 View   Result Date: 08/19/2022 CLINICAL DATA:  Shortness of breath. Worsening shortness of breath and weakness. EXAM: PORTABLE CHEST 1 VIEW COMPARISON:  Radiograph 2 days ago 08/09/2022. PET CT 08/17/2021 FINDINGS: Prior median sternotomy and CABG. Stable heart size and mediastinal contours. Postsurgical volume loss in the left hemithorax. Small left pleural effusion and basilar opacity is similar to recent imaging. Left pleural thickening was seen on recent PET. The right lung is clear. No pulmonary edema. No pneumothorax. IMPRESSION: 1. Stable radiographic appearance of the chest. Left pleural effusion/thickening and basilar opacity is similar to recent imaging. 2. Prior CABG. Electronically Signed   By: Keith Rake M.D.   On: 08/19/2022 17:54         Scheduled Meds:  adagrasib  400 mg Oral BID   aspirin EC  81 mg Oral QHS   enoxaparin (LOVENOX) injection  40 mg Subcutaneous D92E   folic acid  1 mg Oral Daily   levothyroxine  50 mcg Oral QAC breakfast   metoprolol succinate  12.5 mg Oral QHS   multivitamin with minerals  1 tablet Oral Daily   predniSONE  10 mg Oral Q breakfast   rosuvastatin  40 mg Oral QHS   sacubitril-valsartan  1 tablet Oral BID   sertraline  100 mg Oral BID   thiamine (VITAMIN B1) injection  100 mg Intravenous Daily    Or   thiamine  100 mg Oral Daily    Continuous Infusions:    LOS: 0 days    Time spent: [place], MD [facility] To contact the attending provider  between 7A-7P or the covering provider during after hours 7P-7A, please log into the web site [email or web address] and access using universal Avis password for that web site. If you do not have the password, please call the hospital operator.   08/20/2022, 8:06 AM

## 2022-08-22 NOTE — Progress Notes (Signed)
NEUROLOGY CONSULTATION PROGRESS NOTE   Date of service: August 22, 2022 Patient Name: John Hinton MRN:  [id number] DOB:  06-30-1946  Brief HPI  John Hinton is a 77 y.o. 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    Interval Hx   Patient was seen this morning. Corrdination mildly improved in RUE but persistent ataxia in LUE with no Vitals   Vitals:   08/21/22 1606 08/21/22 1938 08/22/22 0442 08/22/22 0752  BP: (!) 91/52 129/68 (!) 105/59 130/74  Pulse: 60 65 (!) 53 (!) 53  Resp: 18 18 17 17   Temp: 98.8 F (37.1 C) 97.7 F (36.5 C) 98.4 F (36.9 C) (!) 97.5 F (36.4 C)  TempSrc: Oral Oral  Oral  SpO2: 97% 98% 96% 99%  Weight:   85.2 kg   Height:         Body mass index is 26.2 kg/m.  Physical Exam   General: Laying comfortably in bed; in no acute distress.  HENT: Normal oropharynx and mucosa. Normal external appearance of ears and nose.  Neck: Supple, no pain or tenderness  CV: No JVD. No peripheral edema.  Pulmonary: Symmetric Chest rise. Normal respiratory effort.  Abdomen: Soft to touch, non-tender.  Ext: No cyanosis, edema, or deformity  Skin: No rash. Normal palpation of skin.   Musculoskeletal: Normal digits and nails by inspection. No clubbing.    Neurologic Examination  Mental Status -  Level of arousal and orientation to time, place, and person were intact. Language including expression, naming, repetition, comprehension was assessed and found intact. Fund of Knowledge was assessed and was intact.   Cranial  Nerves II - XII - II - Visual field intact OU. III, IV, VI - Extraocular movements intact. V - Facial sensation intact bilaterally. VII - Facial movement intact bilaterally. VIII - Hearing & vestibular intact bilaterally. X - Palate elevates symmetrically. XI - Chin turning & shoulder shrug intact bilaterally. XII - Tongue protrusion intact.   Motor:  Muscle bulk: normal, tone normal, pronator drift none tremor has an end intension tremor Mvmt Root Nerve  Muscle Right Left Comments  SA C5/6 Ax Deltoid 5 5   EF C5/6 Mc Biceps 5 5   EE C6/7/8 Rad Triceps 5 5   WF C6/7 Med FCR     WE C7/8 PIN ECU     F Ab C8/T1 U ADM/FDI 5 5   HF L1/2/3 Fem Illopsoas 5 5   KE L2/3/4 Fem Quad 5 5   DF L4/5 D Peron Tib Ant 5 5   PF S1/2 Tibial Grc/Sol 5 5     Sensation:  Light touch Limited on the right foot   Pin prick    Temperature    Vibration Poor in b/l toes and limited in b/l knees  Proprioception    Coordination/Complex Motor:  - Finger to Nose with mild ataxia in RUE, persistent ataxia in RUE. - Heel to shin ataxia present BL - Gait: deferred  Labs   Basic Metabolic Panel:  Lab Results  Component Value Date   NA 142 08/20/2022   K 4.3 08/20/2022  CO2 24 08/20/2022   GLUCOSE 117 (H) 08/20/2022   BUN 22 08/20/2022   CREATININE 1.14 08/20/2022   CALCIUM 8.9 08/20/2022   GFRNONAA >60 08/20/2022   GFRAA 87 02/05/2020   HbA1c: No results found for: "HGBA1C" LDL:  Lab Results  Component Value Date   LDLCALC 77 03/03/2022   Urine Drug Screen: No results found for: "LABOPIA", "COCAINSCRNUR", "LABBENZ", "AMPHETMU", "THCU", "LABBARB"  Alcohol Level No results found for: "ETH" No results found for: "PHENYTOIN", "ZONISAMIDE", "LAMOTRIGINE", "LEVETIRACETA" No results found for: "PHENYTOIN", "PHENOBARB", "VALPROATE", "CBMZ"  Imaging and Diagnostic studies   CT Head without contrast(Personally reviewed): CTH was negative for a large hypodensity concerning for a large territory  infarct or hyperdensity concerning for an ICH   MRI Brain 08/11/22(Personally reviewed): No acute intracranial process. No evidence of metastatic disease.   MRI total spine 08/20/22: IMPRESSION: 1. Redemonstrated metastatic disease in T1 and T10. No significant epidural extension of tumor. 2. Possible abnormal foci in the C7 vertebral body, but no evidence of epidural extension of tumor. 3. No abnormal enhancement in the spinal cord or cauda equina, although evaluation of the cauda equina is somewhat limited by the large field of view. 4. No significant spinal canal stenosis in the spine.  Impression   John Hinton is a 77 y.o. 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  Workup so far with nutritional labs non revealing or pending. Imaging not concerning for cerbellitis. LP not consistent with infection/inflammation. Most of the studies are still pending.  Suspect likely 2/2 check point inhibitor. Given symptom started a couple weeks after his first dose.   Recommendations  - pending CSF studies: CSF VDRL CSF, IgG index, oligoclonal bands, CSF paraneoplastic panel, CSF cytology, CSF cultures with no growth so ar. -SPEP, copper, ceruloplasmin, vitamin E, celiac panel, zinc, vitamin B1,  Serum paraneoplastic antibody panel pending - no Spine mets or leptomeningeal involvement. -Vitamin B12 WNL, folate WNL,  RPR WNL , HIV antibody WNL.  - started on IV solumedrol 1000mg  daily x 5  doses. ______________________________________________________________________   Thank you for the opportunity to take part in the care of this patient. If you have any further questions, please contact the neurology consultation attending.  Signed,  Donnetta Simpers, MD [facility] [phone]   If 7pm to 7am, please call on call as listed on AMION.

## 2022-08-22 NOTE — Progress Notes (Signed)
Mr. Rademaker is doing okay this morning.  He underwent the lumbar puncture yesterday without any problems.  So far, looks that the studies are all unremarkable.  He has been started on high-dose Solu-Medrol.  I really think he is going need physical therapy.  I do not know if he would be a candidate for inpatient rehab.  He probably needs to be on some Diflucan to help with any type of issues with thrush that he may have with steroids.  I do not see a problem with him going back onto Eliquis.  His vital signs are all stable.  Temperature 98.4.  Pulse 53.  Blood pressure 105/59.  His physical exam really is unremarkable.  There is really no changes from yesterday.  I guess 1 question might be whether or not he would be a candidate for inpatient rehab.  Again he does need physical therapy.  Maybe, the steroids will help.  I do not think this would differentiate between paraneoplastic process and neurotoxicity from immunotherapy.  Overall, he does look pretty decent.  Hopefully he will be able to eat well.  I would think the steroids will help with that.  I do appreciate the great care he is getting from everybody on 2 W.  Lattie Haw, MD  Hebrews 12:12

## 2022-08-22 NOTE — Progress Notes (Signed)
PROGRESS NOTE   John Hinton  BWG:[id number] DOB: 07/23/1945 DOA: 08/19/2022 PCP: [name], MD  Brief Narrative:   77 year old white male community dwelling stage IVa adenocarcinoma LUL status post resection 05/2020, + KRAS with mets to right lung adrenals and bones--follows with Dr. 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 Carles Collet neurology to be seen Patient was ultimately referred to the ED, neurology consulted  2/2 LP performed  Hospital-Problem based course  Ataxia and incoordination - MRI brain negative for occult disease - V77, folic acid ceruloplasmin negative within normal limits, HIV negative-await other labs from 1/31 - prelim LP not overly concerning for infection--await all labs from 2/2 as ordered per neurology on this etc.   Stage IVa adenocarcinoma - Chemotherapy on hold at this time -?2/2 related to nivolumab/Paraneoplastic synd, deferring to Dr. Antonieta Pert expertise - high-dose steroids started on 2/2 PM -Fluconazole was started by oncologist on 2/3 ---Patient discussed in detail over phone with Dr. Marin Olp on 2/3-he feels that there is a possibility that we may resume immunotherapy by pill form-I have discussed collective concerns of wife and mine with regards to cognition--he is now a candidate for CIR hopefully  Prior CABG-2007, DES 03/2015 CAD with dilated cardiomyopathy-last EF 50%  07/2022 - Would hold aspirin 81 from am of 08/21/22 as per Neuro, resume post procedure - Can hold Zetia - Metoprolol  resume at 12.5 XL dosing, continue Entresto twice daily  Pulmonary embolism 04/2021 - Eliquis resumed on 2/3 postprocedure  DVT prophylaxis: SCD at this time Code Status: Full code Family Communication: D/w John Hinton at bedside in greater detail on 2/3/and on 2/2 PM-she tells me he is made some cognitive decline and she is taken away the majority of the work/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) She tells me that patient declined chemotherapy secondary to side effects Disposition:  Status is: Observation The patient will require care spanning > 2 midnights and should be moved to inpatient because:   Requires further coordination of care may need CIR    Subjective:  Looks comfortable no distress mentation seems improved he seems sharper than he was and can recall some events from yesterday His past-pointing and ataxia seem better   Objective: Vitals:   08/21/22 1606 08/21/22 1938 08/22/22 0442 08/22/22 0752  BP: (!) 91/52 129/68 (!) 105/59 130/74  Pulse: 60 65 (!) 53 (!) 53  Resp: 18 18 17 17   Temp: 98.8 F (37.1 C) 97.7 F (36.5 C) 98.4 F (36.9 C) (!) 97.5 F (36.4 C)  TempSrc: Oral Oral  Oral  SpO2: 97% 98% 96% 99%  Weight:   85.2 kg   Height:        Intake/Output Summary (Last 24 hours) at 08/22/2022 1214 Last data filed at 08/22/2022 0400 Gross per 24 hour  Intake 80.67 ml  Output --  Net 80.67 ml    Filed Weights   08/19/22 1417 08/22/22 0442  Weight: 81.6 kg 85.2 kg    Examination:  EOMI NCAT no focal deficit Chest is clear no rales rhonchi or wheeze S1-S2 no murmur Hyperreflexic in the upper and lower extremities gait not examined but he is able to finger-nose-finger test fairly well Chest is clear no added sound no wheeze no rales no rhonchi S1-S2 no murmur   Data Reviewed: personally reviewed   CBC    Component  Value Date/Time   WBC 5.6 08/21/2022 0546   RBC 2.97 (L) 08/21/2022 0546   HGB 9.8 (L) 08/21/2022 0546   HGB 11.5 (L) 08/17/2022 0842   HGB 13.7 01/27/2017 0834   HCT 30.4 (L) 08/21/2022 0546   HCT 39.2 01/27/2017 0834   PLT 106 (L) 08/21/2022 0546   PLT 106 (L) 08/17/2022 0842   PLT 165 01/27/2017 0834   MCV 102.4 (H) 08/21/2022 0546   MCV 98 (H) 01/27/2017 0834   MCH 33.0 08/21/2022 0546   MCHC 32.2 08/21/2022 0546   RDW 14.7 08/21/2022 0546   RDW 12.9 01/27/2017 0834   LYMPHSABS 0.5 (L) 08/21/2022 0546   LYMPHSABS 1.0 01/27/2017 0834   MONOABS 0.6 08/21/2022 0546   EOSABS 0.0 08/21/2022 0546   EOSABS 0.1 01/27/2017 0834   BASOSABS 0.0 08/21/2022 0546   BASOSABS 0.0 01/27/2017 0834      Latest Ref Rng & Units 08/20/2022    4:10 AM 08/19/2022    2:38 PM 08/17/2022    8:42 AM  CMP  Glucose 70 - 99 mg/dL 117  115  103   BUN 8 - 23 mg/dL 22  21  18    Creatinine 0.61 - 1.24 mg/dL 1.14  1.21  1.37   Sodium 135 - 145 mmol/L 142  143  147   Potassium 3.5 - 5.1 mmol/L 4.3  3.1  3.7   Chloride 98 - 111 mmol/L 107  108  109   CO2 22 - 32 mmol/L 24  27  28    Calcium 8.9 - 10.3 mg/dL 8.9  8.8  9.2   Total Protein 6.5 - 8.1 g/dL  6.7  6.8   Total Bilirubin 0.3 - 1.2 mg/dL  0.7  0.5   Alkaline Phos 38 - 126 U/L  40  38   AST 15 - 41 U/L  21  18   ALT 0 - 44 U/L  20  17      Radiology Studies: MR TOTAL SPINE METS SCREENING  Result Date: 08/20/2022 CLINICAL DATA:  Lung cancer, evaluate for metastatic disease EXAM: MRI TOTAL SPINE WITHOUT AND WITH CONTRAST TECHNIQUE: Multisequence MR imaging of the spine from the cervical spine to the sacrum was performed prior to and following IV contrast administration for evaluation of spinal metastatic disease. CONTRAST:  9mL GADAVIST GADOBUTROL 1 MMOL/ML IV SOLN COMPARISON:  MRI thoracic and lumbar spine 05/03/2021, correlation is also made with head CT 07/17/2022 FINDINGS: MRI CERVICAL SPINE FINDINGS Alignment: No significant listhesis. Vertebrae:  Heterogeneous marrow signal without significant abnormal T2 hyperintense signal or enhancement. Possible abnormal foci are seen C7 vertebral body (series 27, image 8 and 10), but no evidence of epidural extension of tumor. Cord: No abnormal enhancement.  Normal signal. Posterior Fossa, vertebral arteries, paraspinal tissues: Grossly normal. Not well evaluated given the focus of this study Disc levels: No significant spinal canal stenosis. MRI THORACIC SPINE FINDINGS Alignment:  No listhesis. Vertebrae: Decreased T1 signal, increased T2 signal, and abnormal contrast enhancement in the T1 vertebral body. No significant epidural extension of tumor. Suspect post  radiation changes in the adjacent vertebral bodies. Similar abnormal signal in the T10 vertebral body. No epidural extension of tumor. Cord:  Normal signal and morphology.  No abnormal enhancement. Paraspinal and other soft tissues: Grossly normal. Not well evaluated given the focus of the study. Disc levels: No significant spinal canal stenosis. MRI LUMBAR SPINE FINDINGS Segmentation:  5 lumbar type vertebral bodies. Alignment: No listhesis. Straightening of the normal lumbar lordosis. Vertebrae:  No significant abnormal signal or enhancement. Conus medullaris: Extends to the L1 level and appears normal. No abnormal conus or cauda equina enhancement, although evaluation of the cauda equina is somewhat limited by the large field of view. Paraspinal and other soft tissues: Grossly normal, although not well evaluated given the focus of this study. Disc levels: No significant spinal canal stenosis. IMPRESSION: 1. Redemonstrated metastatic disease in T1 and T10. No significant epidural extension of tumor. 2. Possible abnormal foci in the C7 vertebral body, but no evidence of epidural extension of tumor. 3. No abnormal enhancement in the spinal cord or cauda equina, although evaluation of the cauda equina is somewhat limited by the large field of view. 4. No  significant spinal canal stenosis in the spine. Electronically Signed   By: Merilyn Baba M.D.   On: 08/20/2022 20:37     Scheduled Meds:  apixaban  5 mg Oral BID   fluconazole  100 mg Oral Daily   folic acid  1 mg Oral Daily   levothyroxine  50 mcg Oral QAC breakfast   metoprolol succinate  12.5 mg Oral QHS   multivitamin with minerals  1 tablet Oral Daily   pantoprazole  40 mg Oral Daily   rosuvastatin  40 mg Oral QHS   sacubitril-valsartan  1 tablet Oral BID   sertraline  100 mg Oral BID   thiamine (VITAMIN B1) injection  100 mg Intravenous Daily   Or   thiamine  100 mg Oral Daily   Continuous Infusions:  methylPREDNISolone (SOLU-MEDROL) injection Stopped (08/21/22 2200)     LOS: 2 days   Time spent: [place], MD [facility] To contact the attending provider between 7A-7P or the covering provider during after hours 7P-7A, please log into the web site [email or web address] and access using universal [place] password for that web site. If you do not have the password, please call the hospital operator.  08/22/2022, 12:14 PM

## 2022-08-22 NOTE — Progress Notes (Signed)
Inpatient Rehab Admissions Coordinator:   Per therapy recommendations,  patient was screened for CIR candidacy by Clemens Catholic, MS, CCC-SLP.  At this time,  work up is ongoing and Pt. To have IV Solu-medrol for several days. I will hold off on placing consult for now and see how he looks after work up is complete and he has had a few days of high dose steroids.  Please contact me any with questions.  Clemens Catholic, [place], [place] Admissions Coordinator  [phone] ([place]) [phone] (office)

## 2022-08-23 DIAGNOSIS — R270 Ataxia, unspecified: Secondary | ICD-10-CM | POA: Diagnosis not present

## 2022-08-23 LAB — BASIC METABOLIC PANEL
Anion gap: 9 (ref 5–15)
BUN: 25 mg/dL — ABNORMAL HIGH (ref 8–23)
CO2: 19 mmol/L — ABNORMAL LOW (ref 22–32)
Calcium: 8.1 mg/dL — ABNORMAL LOW (ref 8.9–10.3)
Chloride: 109 mmol/L (ref 98–111)
Creatinine, Ser: 1.1 mg/dL (ref 0.61–1.24)
GFR, Estimated: >60 mL/min (ref >60)
Glucose, Bld: 197 mg/dL — ABNORMAL HIGH (ref 70–99)
Potassium: 3.5 mmol/L (ref 3.5–5.1)
Sodium: 137 mmol/L (ref 135–145)

## 2022-08-23 LAB — CBC
HCT: 33.3 % — ABNORMAL LOW (ref 39.0–52.0)
Hemoglobin: 11 g/dL — ABNORMAL LOW (ref 13.0–17.0)
MCH: 33.1 pg (ref 26.0–34.0)
MCHC: 33 g/dL (ref 30.0–36.0)
MCV: 100.3 fL — ABNORMAL HIGH (ref 80.0–100.0)
Platelets: 120 10*3/uL — ABNORMAL LOW (ref 150–400)
RBC: 3.32 MIL/uL — ABNORMAL LOW (ref 4.22–5.81)
RDW: 14.4 % (ref 11.5–15.5)
WBC: 12.3 10*3/uL — ABNORMAL HIGH (ref 4.0–10.5)
nRBC: 0 % (ref 0.0–0.2)

## 2022-08-23 MED ORDER — ASPIRIN 81 MG PO TBEC
81.0000 mg | DELAYED_RELEASE_TABLET | Freq: Every day | ORAL | Status: DC
  Administered 2022-08-23 – 2022-08-25 (×3): 81 mg via ORAL
  Filled 2022-08-23 (×3): qty 1

## 2022-08-23 MED ORDER — SODIUM CHLORIDE 0.9 % IV SOLN
1000.0000 mg | INTRAVENOUS | Status: AC
  Administered 2022-08-23 – 2022-08-25 (×3): 1000 mg via INTRAVENOUS
  Filled 2022-08-23 (×3): qty 16

## 2022-08-23 NOTE — Progress Notes (Signed)
NEUROLOGY CONSULTATION PROGRESS NOTE   Date of service: August 23, 2022 Patient Name: John Hinton MRN:  [id number] DOB:  [birth year]  Brief HPI  John Hinton is a 77 y.o. 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  LP not consistent with meningitis. Vitamin B12 and nutritional workup negative.  Suspicion for checkpoint inhibitor ataxia vs paraneoplastic. Paraneoplastic panel pending.   Interval Hx   Patient was seen this morning. Corrdination improved in BL uppers and in RLE.  Vitals   Vitals:   08/22/22 1443 08/22/22 1943 08/23/22 0442 08/23/22 0749  BP: 113/73 102/63 127/64 125/68  Pulse: 61 63 (!) 59 62  Resp: 17 18 18 16   Temp: 97.9 F (36.6 C) 98.2 F (36.8 C) 97.7 F (36.5 C) (!) 97.4 F (36.3 C)  TempSrc: Oral Oral Oral Oral  SpO2: 99% 98% 99% 99%  Weight:      Height:         Body mass index is 26.2 kg/m.  Physical Exam   General: Laying comfortably in bed; in no acute distress.  HENT: Normal oropharynx and mucosa. Normal external appearance of ears and nose.  Neck: Supple, no pain or tenderness  CV: No JVD. No peripheral edema.  Pulmonary: Symmetric Chest rise. Normal respiratory effort.  Abdomen: Soft to touch, non-tender.  Ext: No cyanosis, edema, or deformity  Skin: No rash. Normal palpation of skin.   Musculoskeletal: Normal digits and nails by inspection. No clubbing.    Neurologic Examination  Mental Status -  Level of arousal and orientation to time, place, and person were intact. Language including  expression, naming, repetition, comprehension was assessed and found intact. Fund of Knowledge was assessed and was intact.   Cranial Nerves II - XII - II - Visual field intact OU. III, IV, VI - Extraocular movements intact. V - Facial sensation intact bilaterally. VII - Facial movement intact bilaterally. VIII - Hearing & vestibular intact bilaterally. X - Palate elevates symmetrically. XI - Chin turning & shoulder shrug intact bilaterally. XII - Tongue protrusion intact.   Motor:  Muscle bulk: normal, tone normal, pronator drift none tremor has an end intension tremor Mvmt Root Nerve  Muscle Right Left Comments  SA C5/6 Ax Deltoid 5 5   EF C5/6 Mc Biceps 5 5   EE C6/7/8 Rad Triceps 5 5   WF C6/7 Med FCR     WE C7/8 PIN ECU     F Ab C8/T1 U ADM/FDI 5 5   HF L1/2/3 Fem Illopsoas 5 5   KE L2/3/4 Fem Quad 5 5   DF L4/5 D Peron Tib Ant 5 5   PF S1/2 Tibial Grc/Sol 5 5     Sensation:  Light touch Intact BL   Pin prick    Temperature    Vibration Poor in b/l toes and limited in b/l knees  Proprioception    Coordination/Complex Motor:  - Finger to Nose with no ataxia in RUE, significantly improved coordination in LUE but still mild ataxia. - Heel to shin with mild ataxia in RLE and persistent in LLE. - Gait: deferred  Labs   Basic Metabolic Panel:  Lab Results  Component Value Date   NA 137 08/23/2022   K 3.5 08/23/2022   CO2 19 (L) 08/23/2022   GLUCOSE 197 (H) 08/23/2022   BUN 25 (H) 08/23/2022   CREATININE 1.10 08/23/2022   CALCIUM 8.1 (L) 08/23/2022   GFRNONAA >60 08/23/2022   GFRAA 87 02/05/2020   HbA1c: No results found for: "HGBA1C" LDL:  Lab Results  Component Value Date   LDLCALC 77 03/03/2022   Urine Drug Screen: No results found for: "LABOPIA", "COCAINSCRNUR", "LABBENZ", "AMPHETMU", "THCU", "LABBARB"  Alcohol Level No results found for: "ETH" No results found for: "PHENYTOIN", "ZONISAMIDE", "LAMOTRIGINE", "LEVETIRACETA" No results found for:  "PHENYTOIN", "PHENOBARB", "VALPROATE", "CBMZ"  Imaging and Diagnostic studies   CT Head without contrast(Personally reviewed): CTH was negative for a large hypodensity concerning for a large territory infarct or hyperdensity concerning for an ICH   MRI Brain 08/11/22(Personally reviewed): No acute intracranial process. No evidence of metastatic disease.   MRI total spine 08/20/22: IMPRESSION: 1. Redemonstrated metastatic disease in T1 and T10. No significant epidural extension of tumor. 2. Possible abnormal foci in the C7 vertebral body, but no evidence of epidural extension of tumor. 3. No abnormal enhancement in the spinal cord or cauda equina, although evaluation of the cauda equina is somewhat limited by the large field of view. 4. No significant spinal canal stenosis in the spine.  Impression   John Hinton is a 77 y.o. 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  Workup so far with nutritional labs non revealing or pending. Imaging not concerning for cerbellitis. LP not consistent with infection/inflammation. Most of the studies are still pending.  Suspect likely 2/2 check point inhibitor. Given symptom started a couple weeks after his first dose.  Recommendations  - pending CSF studies: CSF VDRL CSF, IgG index, oligoclonal bands, CSF paraneoplastic panel, CSF cytology, CSF cultures with no growth so ar. -SPEP, copper, ceruloplasmin, vitamin E, celiac panel, zinc, vitamin B1,  Serum paraneoplastic antibody panel pending or negative. - no  Spine mets or leptomeningeal involvement. -Vitamin B12 WNL, folate WNL,  RPR WNL , HIV antibody WNL.  - started on IV solumedrol 1000mg  daily x 5 doses. Ataxia is improving. ______________________________________________________________________   Thank you for the opportunity to take part in the care of this patient. If you have any further questions, please contact the neurology consultation attending.  Signed,  Donnetta Simpers, MD [facility] [phone]   If 7pm to 7am, please call on call as listed on AMION.

## 2022-08-23 NOTE — Progress Notes (Addendum)
PROGRESS NOTE   John Hinton  KCL:[id number] DOB: 25-Aug-1945 DOA: 08/19/2022 PCP: [name], MD  Brief Narrative:   77 year old white male community dwelling stage IVa adenocarcinoma LUL status post resection 05/2020, + KRAS with mets to right lung adrenals and bones--follows with Dr. Cheri Kearns therapy Garnette Czech, Adagasrib increased 07/25/22-- recent start to Nivolumab 07/29/22 History of dilated cardiomyopathy 07/2022 EF 50% Also underlying PE involving right lung  last OV 07/24/2022 documents that PET scan done 12/29 showed progressive disease and meds were adjusted He did follow-up Dr Marin Olp 1/22 with very erratic driving and has been unsteady after the increase in dose of the Adagrasib--CT head was neg and an MRI was set up at the time and he was told not to drive and he did follow-up on 1/29 it was thought that he might have a paraneoplastic syndrome and he was referred to Dr. Carles Collet neurology to be seen Patient was ultimately referred to the ED, neurology/oncology consulted  2/2 LP performed-grossly negative for infection 2/3-IV Solu-Medrol started    Hospital-Problem based course  Ataxia and incoordination-?  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 Antonieta Pert expertise -Fluconazole was started by oncologist on 2/3 -TCP is chronic since 05/2022 --per Dr. Marin Olp 2/3-he feels that there is a possibility that we may resume immunotherapy by pill form -TBD as  outpatient  WBC --elevated 2/2 steroids--watch for fever  Prior CABG-2007, DES 03/2015 CAD with dilated cardiomyopathy-last EF 50% 07/2022 - Aspirin 81 resumed on 2/4 - Can hold Zetia - Metoprolol 12.5 XL dosing, continue Entresto twice daily  Pulmonary embolism 04/2021 - Eliquis resumed on 2/3 postprocedure  DVT prophylaxis: SCD at this time Code Status: Full code Family Communication: See note regarding family discussion on 2/2 and 2/3 Wife updated bedside Disposition:  Status is: Observation  Hopeful for CIR --may be able to complete IV steroids there?     Subjective:  having lunch--in good spirits No pain fever No n/v Worked with therapy in room with several back-forth to bed--semi-independent to the RR Wife thinks he is ~ 15% of his usual level of function re: ADL/IADL  Objective: Vitals:   08/22/22 1443 08/22/22 1943 08/23/22 0442 08/23/22 0749  BP: 113/73 102/63 127/64 125/68  Pulse: 61 63 (!) 59 62  Resp: 17 18 18 16   Temp: 97.9 F (36.6 C) 98.2 F (36.8 C) 97.7 F (36.5 C) (!) 97.4 F (36.3 C)  TempSrc: Oral Oral Oral Oral  SpO2: 99% 98% 99% 99%  Weight:      Height:       No intake or output data in the 24 hours ending 08/23/22 1558  Filed Weights   08/19/22 1417 08/22/22 0442  Weight: 81.6 kg 85.2 kg    Examination:  EOMI NCAT  Chest is clear no rales rhonchi or wheeze S1-S2 no murmur Finger nose finger altered on LUE Similarly Shin-heel altered LLE Cold touch ~ same bilaterally Hyperreflexic in the upper and lower extremities gait not examined but he is able to finger-nose-finger test fairly well Chest is clear no added sound no wheeze no rales  no rhonchi S1-S2 no murmur   Data Reviewed: personally reviewed   CBC    Component Value Date/Time   WBC 12.3 (H) 08/23/2022 0215   RBC 3.32 (L) 08/23/2022 0215   HGB 11.0 (L) 08/23/2022 0215   HGB 11.5 (L) 08/17/2022 0842   HGB 13.7 01/27/2017 0834   HCT 33.3 (L) 08/23/2022 0215   HCT 39.2  01/27/2017 0834   PLT 120 (L) 08/23/2022 0215   PLT 106 (L) 08/17/2022 0842   PLT 165 01/27/2017 0834   MCV 100.3 (H) 08/23/2022 0215   MCV 98 (H) 01/27/2017 0834   MCH 33.1 08/23/2022 0215   MCHC 33.0 08/23/2022 0215   RDW 14.4 08/23/2022 0215   RDW 12.9 01/27/2017 0834   LYMPHSABS 0.5 (L) 08/21/2022 0546   LYMPHSABS 1.0 01/27/2017 0834   MONOABS 0.6 08/21/2022 0546   EOSABS 0.0 08/21/2022 0546   EOSABS 0.1 01/27/2017 0834   BASOSABS 0.0 08/21/2022 0546   BASOSABS 0.0 01/27/2017 0834      Latest Ref Rng & Units 08/23/2022    2:15 AM 08/20/2022    4:10 AM 08/19/2022    2:38 PM  CMP  Glucose 70 - 99 mg/dL 197  117  115   BUN 8 - 23 mg/dL 25  22  21    Creatinine 0.61 - 1.24 mg/dL 1.10  1.14  1.21   Sodium 135 - 145 mmol/L 137  142  143   Potassium 3.5 - 5.1 mmol/L 3.5  4.3  3.1   Chloride 98 - 111 mmol/L 109  107  108   CO2 22 - 32 mmol/L 19  24  27    Calcium 8.9 - 10.3 mg/dL 8.1  8.9  8.8   Total Protein 6.5 - 8.1 g/dL   6.7   Total Bilirubin 0.3 - 1.2 mg/dL   0.7   Alkaline Phos 38 - 126 U/L   40   AST 15 - 41 U/L   21   ALT 0 - 44 U/L   20      Radiology Studies: No results found.   Scheduled Meds:  apixaban  5 mg Oral BID   fluconazole  100 mg Oral Daily   folic acid  1 mg Oral Daily   levothyroxine  50 mcg Oral QAC breakfast   metoprolol succinate  12.5 mg Oral QHS   multivitamin with minerals  1 tablet Oral Daily   pantoprazole  40 mg Oral Daily   rosuvastatin  40 mg Oral QHS   sacubitril-valsartan  1 tablet Oral BID   sertraline  100 mg Oral BID   thiamine (VITAMIN B1) injection  100 mg Intravenous Daily   Or   thiamine  100 mg Oral Daily   Continuous Infusions:  methylPREDNISolone (SOLU-MEDROL) injection 1,000 mg (08/23/22 1425)     LOS: 3 days   Time spent: [place], MD [facility] To contact the attending provider between 7A-7P or the covering provider during after hours 7P-7A, please log into the web site [email or web address] and  access using universal [place] password for that web site. If you do not have the password, please call the hospital operator.  08/23/2022, 3:58 PM

## 2022-08-23 NOTE — Plan of Care (Signed)

## 2022-08-23 NOTE — Evaluation (Signed)
Occupational Therapy Evaluation Patient Details Name: John Hinton MRN: [id number] DOB: April 15, 1946 Today's Date: 08/23/2022   History of Present Illness Pt is a 77 y.o. male admitted 08/19/22 with new onset weakness, confusion, and ataxia. CTH and MRI negative. LP negative. Pt started on IV solumedrol. PMH:  stage 4 adenocarcinoma of lung with mets to lungs, B adrenal glands, bone.  Prior PE without RHS on chronic eliquis, BPH, HTN, dilated cardiomyopathy.   Clinical Impression   PTA, pt lived with wife and was independent in ADL and IADL. Upon eval, pt presents with decreased coordination and cognition limiting both safety and independence in ADL and IADL. Pt performing LB ADL with up to min A and UB ADL with set-up. Pt performing functional mobility with min A during session. PT with decreased coordination observed during grooming tasks at sink. Pt scored a 6 on the short blessed test; difficulty with immediate and delayed memory recall as well as problem solving and organization. Due to significant change in functional status, and pt independent at baseline, recommending continued collaborative rehabilitation approach at [facility] to optimize safety and independence in ADL and IADL.      Recommendations for follow up therapy are one component of a multi-disciplinary discharge planning process, led by the attending physician.  Recommendations may be updated based on patient status, additional functional criteria and insurance authorization.   Follow Up Recommendations  Acute inpatient rehab (3hours/day)     Assistance Recommended at Discharge Intermittent Supervision/Assistance  Patient can return home with the following A little help with walking and/or transfers;A little help with bathing/dressing/bathroom;Assistance with cooking/housework;Direct supervision/assist for medications management;Direct supervision/assist for financial management;Assist for transportation;Help with stairs or ramp for  entrance    Functional Status Assessment  Patient has had a recent decline in their functional status and demonstrates the ability to make significant improvements in function in a reasonable and predictable amount of time.  Equipment Recommendations  Other (comment) (defer)    Recommendations for Other Services Rehab consult     Precautions / Restrictions Precautions Precautions: Fall      Mobility Bed Mobility Overal bed mobility: Needs Assistance Bed Mobility: Supine to Sit, Sit to Supine     Supine to sit: Min guard Sit to supine: Min guard   General bed mobility comments: min guard A for safety    Transfers Overall transfer level: Needs assistance Equipment used: Rolling walker (2 wheels) Transfers: Sit to/from Stand Sit to Stand: Min assist           General transfer comment: Assist for steadying after power up      Balance Overall balance assessment: Needs assistance Sitting-balance support: Bilateral upper extremity supported, Feet supported Sitting balance-Leahy Scale: Fair Sitting balance - Comments: min guard during seated LB ADL   Standing balance support: Reliant on assistive device for balance, During functional activity, Bilateral upper extremity supported Standing balance-Leahy Scale: Poor Standing balance comment: MIn guard-min A for static standing balance                           ADL either performed or assessed with clinical judgement   ADL Overall ADL's : Needs assistance/impaired Eating/Feeding: Set up   Grooming: Oral care;Min guard;Minimal assistance;Standing Grooming Details (indicate cue type and reason): intermittent min A for standing balance without support Upper Body Bathing: Set up;Sitting   Lower Body Bathing: Minimal assistance;Sit to/from stand Lower Body Bathing Details (indicate cue type and reason): MIn A for steadying  after initial rise Upper Body Dressing : Set up;Sitting   Lower Body Dressing: Min  guard;Sitting/lateral leans Lower Body Dressing Details (indicate cue type and reason): to don new socks. Toilet Transfer: Min guard;Rolling walker (2 wheels);Ambulation Toilet Transfer Details (indicate cue type and reason): Min guard to potty with RW and min cues for RW education and safety Toileting- Clothing Manipulation and Hygiene: Min guard;Sitting/lateral lean       Functional mobility during ADLs: Minimal assistance General ADL Comments: Pt does not use RW at baseline. Min guard with intermittent min A for RW use; Constant min A forfunctional mobility without RW. VCs for safety throughout     Vision Baseline Vision/History: 1 Wears glasses Ability to See in Adequate Light: 0 Adequate Patient Visual Report: No change from baseline Vision Assessment?: Yes Tracking/Visual Pursuits: Decreased smoothness of horizontal tracking Depth Perception: Undershoots;Overshoots Additional Comments: Able to read, etc     Perception Perception Perception Tested?: No   Praxis Praxis Praxis tested?: Not tested    Pertinent Vitals/Pain Pain Assessment Pain Assessment: No/denies pain     Hand Dominance Right   Extremity/Trunk Assessment Upper Extremity Assessment Upper Extremity Assessment: Generalized weakness;RUE deficits/detail;LUE deficits/detail RUE Deficits / Details: 5/5 strength. Incinsistencies during coordination testing. RUE with more difficulty with finger to nose and LUE with more difficulty with FM coordination. RUE Sensation: decreased proprioception RUE Coordination: decreased fine motor;decreased gross motor LUE Deficits / Details: 5/5 strength. Incinsistencies during coordination testing. RUE with more difficulty with finger to nose and LUE with more difficulty with FM coordination. LUE Sensation: decreased proprioception LUE Coordination: decreased fine motor;decreased gross motor   Lower Extremity Assessment Lower Extremity Assessment: Defer to PT evaluation    Cervical / Trunk Assessment Cervical / Trunk Assessment: Normal   Communication Communication Communication: No difficulties   Cognition Arousal/Alertness: Awake/alert Behavior During Therapy: WFL for tasks assessed/performed Overall Cognitive Status: Impaired/Different from baseline Area of Impairment: Attention, Memory, Safety/judgement, Awareness, Problem solving                   Current Attention Level: Selective Memory: Decreased short-term memory   Safety/Judgement: Decreased awareness of safety Awareness: Emergent Problem Solving: Slow processing, Difficulty sequencing, Requires verbal cues General Comments: A&O x 4. Able to solve simple math equations. Pt scoring a 6 on the short blessed tes; difficulty with immediate and delayed memory recall as well as problem solving/working memory during stating months of the year in the reverse order     General Comments  VSS. SpO2>93 throughout session and HR max of 93. However, observed with increased WOB during LB dressing and functional mobiltiy    Exercises     Shoulder Instructions      Home Living Family/patient expects to be discharged to:: Private residence Living Arrangements: Spouse/significant other Available Help at Discharge: Family;Available 24 hours/day Type of Home: House Home Access: Stairs to enter [employer] of Steps: 4 Entrance Stairs-Rails: None Home Layout: Multi-level Alternate Level Stairs-Number of Steps: Original log cabin house with multiple additions. One step to transition between each addition. Alternate Level Stairs-Rails: None Bathroom Shower/Tub: Tub/shower unit;Walk-in shower         Home Equipment: Shower seat - built in          Prior Functioning/Environment Prior Level of Function : Independent/Modified Independent;Driving               ADLs Comments: wife recently assisting with IADL due to changes in memory        OT  Problem List: Decreased  strength;Decreased activity tolerance;Impaired balance (sitting and/or standing);Decreased coordination;Decreased cognition;Decreased safety awareness;Decreased knowledge of use of DME or AE;Cardiopulmonary status limiting activity;Impaired UE functional use      OT Treatment/Interventions: Self-care/ADL training;Therapeutic exercise;DME and/or AE instruction;Balance training;Patient/family education;Cognitive remediation/compensation;Therapeutic activities;Neuromuscular education    OT Goals(Current goals can be found in the care plan section) Acute Rehab OT Goals Patient Stated Goal: get back to being myself OT Goal Formulation: With patient Time For Goal Achievement: 09/06/22 Potential to Achieve Goals: Good  OT Frequency: Min 2X/week    Co-evaluation              AM-PAC OT "6 Clicks" Daily Activity     Outcome Measure Help from another person eating meals?: None Help from another person taking care of personal grooming?: A Little Help from another person toileting, which includes using toliet, bedpan, or urinal?: A Little Help from another person bathing (including washing, rinsing, drying)?: A Little Help from another person to put on and taking off regular upper body clothing?: A Little Help from another person to put on and taking off regular lower body clothing?: A Little 6 Click Score: 19   End of Session Equipment Utilized During Treatment: Gait belt;Rolling walker (2 wheels) Nurse Communication: Mobility status  Activity Tolerance: Patient tolerated treatment well Patient left: in bed;with call bell/phone within reach;with bed alarm set;with family/visitor present  OT Visit Diagnosis: Unsteadiness on feet (R26.81);Muscle weakness (generalized) (M62.81);Other abnormalities of gait and mobility (R26.89);Other symptoms and signs involving cognitive function;History of falling (Z91.81)                Time: 2297-9892 OT Time Calculation (min): 35 min Charges:  OT General  Charges $OT Visit: 1 Visit OT Evaluation $OT Eval Moderate Complexity: 1 Mod OT Treatments $Self Care/Home Management : 8-22 mins  Elder Cyphers, OTR/L [facility] Acute Rehabilitation Office: [phone]   Magnus Ivan 08/23/2022, 12:56 PM

## 2022-08-24 DIAGNOSIS — R270 Ataxia, unspecified: Secondary | ICD-10-CM | POA: Diagnosis not present

## 2022-08-24 LAB — CSF CULTURE W GRAM STAIN
Culture: NO GROWTH
Gram Stain: NONE SEEN

## 2022-08-24 LAB — BASIC METABOLIC PANEL
Anion gap: 9 (ref 5–15)
BUN: 29 mg/dL — ABNORMAL HIGH (ref 8–23)
CO2: 24 mmol/L (ref 22–32)
Calcium: 8.2 mg/dL — ABNORMAL LOW (ref 8.9–10.3)
Chloride: 109 mmol/L (ref 98–111)
Creatinine, Ser: 1.19 mg/dL (ref 0.61–1.24)
GFR, Estimated: >60 mL/min (ref >60)
Glucose, Bld: 162 mg/dL — ABNORMAL HIGH (ref 70–99)
Potassium: 4.1 mmol/L (ref 3.5–5.1)
Sodium: 142 mmol/L (ref 135–145)

## 2022-08-24 LAB — CBC
HCT: 32 % — ABNORMAL LOW (ref 39.0–52.0)
Hemoglobin: 10.4 g/dL — ABNORMAL LOW (ref 13.0–17.0)
MCH: 32.9 pg (ref 26.0–34.0)
MCHC: 32.5 g/dL (ref 30.0–36.0)
MCV: 101.3 fL — ABNORMAL HIGH (ref 80.0–100.0)
Platelets: 118 10*3/uL — ABNORMAL LOW (ref 150–400)
RBC: 3.16 MIL/uL — ABNORMAL LOW (ref 4.22–5.81)
RDW: 14.6 % (ref 11.5–15.5)
WBC: 11.7 10*3/uL — ABNORMAL HIGH (ref 4.0–10.5)
nRBC: 0 % (ref 0.0–0.2)

## 2022-08-24 LAB — GLIADIN ANTIBODIES, SERUM
Antigliadin Abs, IgA: 5 units (ref 0–19)
Gliadin IgG: 3 units (ref 0–19)

## 2022-08-24 NOTE — Consult Note (Signed)
Physical Medicine and Rehabilitation Consult Reason for Consult:ataxia, balance loss Referring Physician:Samtani   HPI: John Hinton is a 77 y.o. 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 He is followed by Jenel Lucks most recently was on adagrasib which was increased last month. He was noted to have progressive neurological "issues" with unsteady gait and weakness of legs. MRI of brain/spine negative for nervous sytsem mets. CSF negative inflammatory process, infection, other cell studies pending ?paraneoplastic syndrome. Pt was placed on IV solumedrol for 5 days. Today is day 4/5. Pt and spouse already noticing substantial improvement in coordination and sensation today. Pt ambulated 200' min assist with PT using RW but significant ataxia noted with drifting to the right. Pt was min-guard for transfers   Review of Systems  Constitutional:  Negative for chills and fever.  HENT:  Negative for hearing loss and tinnitus.   Eyes:  Negative for photophobia and pain.  Respiratory:  Negative for cough.   Cardiovascular:  Negative for palpitations and orthopnea.  Gastrointestinal:  Negative for nausea and vomiting.  Genitourinary:  Negative for urgency.  Musculoskeletal:  Negative for back pain, falls, joint pain, myalgias and neck pain.  Neurological:  Positive for sensory change and weakness. Negative for headaches.  Psychiatric/Behavioral:  Negative for depression.    Past Medical History:  Diagnosis Date   BPH (benign prostatic hypertrophy)    Bradycardia    a. H/o asymptomatic bradycardia.   Coronary artery disease    a. s/p CABG ~2007 with LIMA to LAD, SVG seq to OM1 and OM2, SVG to PDA and SVG to diag. b. Abnormal nuc 03/2015 - s/p DES to SVG-OM1-OM2. 01/29/17 POBA with cutting balloon to SVG-->OM.  No ischemia Lexi myoview 2021   DCM (dilated cardiomyopathy) ([place]) 09/17/2015   mixed  by cMRI 03/2021 with EF 39% with non viable infarct in basal to mid lateral wall and LGE pattern c/w nonischemic component as well. Mild RV dysfunction RVEF 42%.   Dyslipidemia    Dyspnea    r/t lung mass   ED (erectile dysfunction)    Fear of heights    Goals of care, counseling/discussion 06/19/2020   History of radiation therapy    T spine and left ilium 10/07/2021-10/20/2021  Dr Gery Pray   Hypertension    Non-small cell carcinoma of lung, stage 4, left ([place]) 06/19/2020   OSA (obstructive sleep apnea) 07/12/2015   Moderate OSA with AHI 16.5/hr now on CPAP at 8cm H2O, uses cpap nightly      RBBB    Shoulder, capsulitis, adhesive    Left Shoulder   Past Surgical History:  Procedure Laterality Date   APPENDECTOMY     BRONCHIAL BIOPSY  05/09/2020   Procedure: BRONCHIAL BIOPSIES;  Surgeon: [name], DO;  Location: [facility];  Service: Pulmonary;;   BRONCHIAL BRUSHINGS  05/09/2020   Procedure: BRONCHIAL BRUSHINGS;  Surgeon: [name], DO;  Location: [facility];  Service: Pulmonary;;   BRONCHIAL NEEDLE ASPIRATION BIOPSY  05/09/2020   Procedure: BRONCHIAL NEEDLE ASPIRATION BIOPSIES;  Surgeon: [name], DO;  Location: [facility] [facility];  Service: Pulmonary;;   BRONCHIAL WASHINGS  05/09/2020   Procedure: BRONCHIAL WASHINGS;  Surgeon: [name], DO;  Location: [place];  Service: Pulmonary;;   CARDIAC CATHETERIZATION N/A 04/05/2015   Procedure: Left Heart Cath and Coronary Angiography;  Surgeon: Sherren Mocha, MD;  Location: [facility]  INVASIVE CV LAB;  Service: Cardiovascular;  Laterality: N/A;   COLONOSCOPY     CORONARY ARTERY BYPASS GRAFT     w/LIMA to LAD, seq SVG to OM1 and OM2, SVG to PDA and SVG to Diagonal   CORONARY BALLOON ANGIOPLASTY N/A 01/29/2017   Procedure: Coronary Balloon Angioplasty;  Surgeon: Martinique, Peter M, MD;  Location: [place] CV LAB;  Service: Cardiovascular;  Laterality: N/A;   FIDUCIAL MARKER PLACEMENT  05/09/2020   Procedure:  FIDUCIAL MARKER PLACEMENT;  Surgeon: [name], DO;  Location: [place] [facility];  Service: Pulmonary;;   INTERCOSTAL NERVE BLOCK Left 05/22/2020   Procedure: INTERCOSTAL NERVE BLOCK;  Surgeon: Lajuana Matte, MD;  Location: [place];  Service: Thoracic;  Laterality: Left;   LEFT HEART CATH AND CORS/GRAFTS ANGIOGRAPHY N/A 01/29/2017   Procedure: Left Heart Cath and Cors/Grafts Angiography;  Surgeon: Martinique, Peter M, MD;  Location: [place] CV [facility];  Service: Cardiovascular;  Laterality: N/A;   NODE DISSECTION N/A 05/22/2020   Procedure: NODE DISSECTION;  Surgeon: Lajuana Matte, MD;  Location: [place];  Service: Thoracic;  Laterality: N/A;   VIDEO BRONCHOSCOPY WITH ENDOBRONCHIAL NAVIGATION N/A 05/09/2020   Procedure: VIDEO BRONCHOSCOPY WITH ENDOBRONCHIAL NAVIGATION;  Surgeon: [name], DO;  Location: [place];  Service: Pulmonary;  Laterality: N/A;   VIDEO BRONCHOSCOPY WITH ENDOBRONCHIAL ULTRASOUND N/A 05/09/2020   Procedure: VIDEO BRONCHOSCOPY WITH ENDOBRONCHIAL ULTRASOUND;  Surgeon: [name], DO;  Location: [place];  Service: Pulmonary;  Laterality: N/A;   Family History  Problem Relation Age of Onset   Lung cancer Mother    Lung cancer Father    Heart disease Father    Heart failure Father    Dementia Father    Cancer Sister    Social History:  reports that he quit smoking about 39 years ago. His smoking use included cigarettes. He quit smokeless tobacco use about 36 years ago. He reports current alcohol use of about 20.0 - 28.0 standard drinks of alcohol per week. He reports that he does not use drugs. Allergies:  Allergies  Allergen Reactions   Amlodipine Swelling    Swelling of ankles   Medications Prior to Admission  Medication Sig Dispense Refill   adagrasib (KRAZATI) 200 MG tablet Take 2 tablets (400 mg total) by mouth 2 (two) times daily. (Patient taking differently: Take 200 mg by mouth 2 (two) times daily.) 120 tablet 4   aspirin 81 MG  tablet Take 81 mg by mouth at bedtime.      cholecalciferol (VITAMIN D) 1000 UNITS tablet Take 1,000 Units by mouth at bedtime.      Cyanocobalamin (B-12 PO) Take 1,000 mcg by mouth daily.      diphenoxylate-atropine (LOMOTIL) 2.5-0.025 MG tablet TAKE 2 TABLETS BY MOUTH AT ONSET OF DIARRHEA THEN TAKE 1 TABLET AFTER EACH LOOSE STOOL MAX 8 TABLETS PER DAY 100 tablet 0   ELIQUIS 5 MG TABS tablet TAKE 1 TABLET BY MOUTH TWICE A DAY 60 tablet 3   ezetimibe (ZETIA) 10 MG tablet Take 1 tablet (10 mg total) by mouth daily. 90 tablet 3   folic acid (FOLVITE) 1 MG tablet TAKE ONE TABLET BY MOUTH DAILY START 5 TO 7 DAYS PRIOR TO CHEMO AND CONTINUE UNTIL 21 DAYS AFTER COMPLETES 90 tablet 3   levothyroxine (SYNTHROID) 50 MCG tablet Take 1 tablet (50 mcg total) by mouth daily before breakfast. 30 tablet 3   metoprolol succinate (TOPROL-XL) 25 MG 24 hr tablet Take 1 tablet (25 mg  total) by mouth at bedtime. 90 tablet 3   Multiple Vitamin (MULTIVITAMIN) capsule Take 1 capsule by mouth daily.     nitroGLYCERIN (NITROSTAT) 0.4 MG SL tablet Place 1 tablet (0.4 mg total) under the tongue every 5 (five) minutes as needed for chest pain. 25 tablet 3   oxyCODONE-acetaminophen (PERCOCET) 7.5-325 MG tablet Take 1 tablet by mouth every 4 (four) hours as needed for severe pain. 60 tablet 0   predniSONE (DELTASONE) 10 MG tablet Take 1 tablet (10 mg total) by mouth daily with breakfast. 30 tablet 4   rosuvastatin (CRESTOR) 40 MG tablet TAKE ONE TABLET BY MOUTH EVERY NIGHT AT BEDTIME 90 tablet 1   sacubitril-valsartan (ENTRESTO) 24-26 MG TAKE ONE TABLET BY MOUTH TWICE A DAY 60 tablet 9   sertraline (ZOLOFT) 100 MG tablet Take 100 mg by mouth 2 (two) times daily.     sucralfate (CARAFATE) 1 g tablet Take 1 tablet (1 g total) by mouth 4 (four) times daily. Dissolve each tablet in 15 cc water before use. (Patient taking differently: Take 1 g by mouth 4 (four) times daily as needed. Dissolve each tablet in 15 cc water before use.) 120  tablet 2   traMADol (ULTRAM) 50 MG tablet Take 1 tablet (50 mg total) by mouth every 6 (six) hours as needed. 90 tablet 0   colchicine 0.6 MG tablet Take 1 tablet (0.6 mg total) by mouth every 8 (eight) hours as needed. 60 tablet 1    Home: Home Living Family/patient expects to be discharged to:: Private residence Living Arrangements: Spouse/significant other Available Help at Discharge: Family, Available 24 hours/day Type of Home: House Home Access: Stairs to enter [employer] of Steps: 4 Entrance Stairs-Rails: None Home Layout: Multi-level Alternate Level Stairs-Number of Steps: Original log cabin house with multiple additions. One step to transition between each addition. Alternate Level Stairs-Rails: None Bathroom Shower/Tub: Tub/shower unit, Walk-in shower Home Equipment: Shower seat - built in  Functional History: Prior Function Prior Level of Function : Independent/Modified Independent, Driving ADLs Comments: wife recently assisting with IADL due to changes in memory Functional Status:  Mobility: Bed Mobility Overal bed mobility: Needs Assistance Bed Mobility: Supine to Sit, Sit to Supine Supine to sit: Min guard Sit to supine: Min guard General bed mobility comments: min guard A for safety Transfers Overall transfer level: Needs assistance Equipment used: Rolling walker (2 wheels) Transfers: Sit to/from Stand Sit to Stand: Min assist General transfer comment: Assist for steadying after power up Ambulation/Gait Ambulation/Gait assistance: Min assist, +2 safety/equipment Gait Distance (Feet): 200 Feet Assistive device: Rolling walker (2 wheels) Gait Pattern/deviations: Step-through pattern, Decreased stride length, Wide base of support, Ataxic, Drifts right/left General Gait Details: assist to manage RW and maintain balance occasonally.  Pt with much better gait today than last treatment with incr steadiness overall.   3/4 DOE. SpO2 95% on RA. Gait  velocity: decreased Gait velocity interpretation: <1.8 ft/sec, indicate of risk for recurrent falls Stairs:  (able to step over lip of shower in bathroom mod assist using RW and grab bar for support.)    ADL: ADL Overall ADL's : Needs assistance/impaired Eating/Feeding: Set up Grooming: Oral care, Min guard, Minimal assistance, Standing Grooming Details (indicate cue type and reason): intermittent min A for standing balance without support Upper Body Bathing: Set up, Sitting Lower Body Bathing: Minimal assistance, Sit to/from stand Lower Body Bathing Details (indicate cue type and reason): MIn A for steadying after initial rise Upper Body Dressing : Set up, Sitting  Lower Body Dressing: Min guard, Sitting/lateral leans Lower Body Dressing Details (indicate cue type and reason): to don new socks. Toilet Transfer: Min guard, Rolling walker (2 wheels), Ambulation Toilet Transfer Details (indicate cue type and reason): Min guard to potty with RW and min cues for RW education and safety Toileting- Clothing Manipulation and Hygiene: Min guard, Sitting/lateral lean Functional mobility during ADLs: Minimal assistance General ADL Comments: Pt does not use RW at baseline. Min guard with intermittent min A for RW use; Constant min A forfunctional mobility without RW. VCs for safety throughout  Cognition: Cognition Overall Cognitive Status: Impaired/Different from baseline Orientation Level: Oriented X4 Cognition Arousal/Alertness: Awake/alert Behavior During Therapy: WFL for tasks assessed/performed Overall Cognitive Status: Impaired/Different from baseline Area of Impairment: Attention, Memory, Safety/judgement, Awareness, Problem solving Current Attention Level: Selective Memory: Decreased short-term memory Safety/Judgement: Decreased awareness of safety Awareness: Emergent Problem Solving: Slow processing, Difficulty sequencing, Requires verbal cues General Comments: A&O x 4. Able to  solve simple math equations. Pt scoring a 6 on the short blessed tes; difficulty with immediate and delayed memory recall as well as problem solving/working memory during stating months of the year in the reverse order  Blood pressure 116/83, pulse 60, temperature 97.9 F (36.6 C), temperature source Oral, resp. rate 18, height 5\' 11"  (1.803 m), weight 85.2 kg, SpO2 98 %. Physical Exam Constitutional:      Appearance: Normal appearance.  HENT:     Head: Normocephalic.     Nose: Nose normal.     Mouth/Throat:     Mouth: Mucous membranes are moist.  Eyes:     Pupils: Pupils are equal, round, and reactive to light.  Cardiovascular:     Rate and Rhythm: Normal rate and regular rhythm.  Pulmonary:     Effort: Pulmonary effort is normal.  Abdominal:     General: There is no distension.     Palpations: Abdomen is soft.  Musculoskeletal:        General: Normal range of motion.     Cervical back: Normal range of motion.  Skin:    General: Skin is warm.  Neurological:     Mental Status: He is alert.     Comments: Alert and oriented x 3. Normal insight and awareness. Intact Memory. Normal language and speech. Cranial nerve exam unremarkable. Motor nearly 5/5 in all 4 limbs. Minima limb ataxia noted in all limbs. Pt demonstrated intact proprioception in bed. Sensed LT and pain in all 4's.   Psychiatric:        Mood and Affect: Mood normal.        Behavior: Behavior normal.     Results for orders placed or performed during the hospital encounter of 08/19/22 (from the past 24 hour(s))  CBC     Status: Abnormal   Collection Time: 08/24/22  3:45 AM  Result Value Ref Range   WBC 11.7 (H) 4.0 - 10.5 K/uL   RBC 3.16 (L) 4.22 - 5.81 MIL/uL   Hemoglobin 10.4 (L) 13.0 - 17.0 g/dL   HCT 32.0 (L) 39.0 - 52.0 %   MCV 101.3 (H) 80.0 - 100.0 fL   MCH 32.9 26.0 - 34.0 pg   MCHC 32.5 30.0 - 36.0 g/dL   RDW 14.6 11.5 - 15.5 %   Platelets 118 (L) 150 - 400 K/uL   nRBC 0.0 0.0 - 0.2 %  Basic  metabolic panel     Status: Abnormal   Collection Time: 08/24/22  3:45 AM  Result Value Ref  Range   Sodium 142 135 - 145 mmol/L   Potassium 4.1 3.5 - 5.1 mmol/L   Chloride 109 98 - 111 mmol/L   CO2 24 22 - 32 mmol/L   Glucose, Bld 162 (H) 70 - 99 mg/dL   BUN 29 (H) 8 - 23 mg/dL   Creatinine, Ser 1.19 0.61 - 1.24 mg/dL   Calcium 8.2 (L) 8.9 - 10.3 mg/dL   GFR, Estimated >60 >60 mL/min   Anion gap 9 5 - 15   No results found.  Assessment/Plan: Diagnosis: Impaired balance/ataxia related to ?paraneoplastic syndrome Does the need for close, 24 hr/day medical supervision in concert with the patient's rehab needs make it unreasonable for this patient to be served in a less intensive setting? Potentially Co-Morbidities requiring supervision/potential complications: hx PE/CABG on eliquis.  Due to bladder management, bowel management, safety, skin/wound care, disease management, medication administration, and patient education, does the patient require 24 hr/day rehab nursing? Yes and Potentially Does the patient require coordinated care of a physician, rehab nurse, therapy disciplines of PT, OT to address physical and functional deficits in the context of the above medical diagnosis(es)? Yes and Potentially Addressing deficits in the following areas: balance, endurance, locomotion, strength, transferring, bowel/bladder control, bathing, dressing, feeding, grooming, toileting, and psychosocial support Can the patient actively participate in an intensive therapy program of at least 3 hrs of therapy per day at least 5 days per week? Yes The potential for patient to make measurable gains while on inpatient rehab is good Anticipated functional outcomes upon discharge from inpatient rehab are modified independent and supervision  with PT, modified independent and supervision with OT, n/a with SLP. Estimated rehab length of stay to reach the above functional goals is: potentially 5-7 days Anticipated  discharge destination: Home Overall Rehab/Functional Prognosis: good  POST ACUTE RECOMMENDATIONS: This patient's condition is appropriate for continued rehabilitative care in the following setting:  inpatient rehab vs home with home health Patient has agreed to participate in recommended program. Yes Note that insurance prior authorization may be required for reimbursement for recommended care.  Comment: He seems to be responding to steroids and  made great strides today with therapy. My neurological exam also demonstrated improvement compared to recent exams documented in chart.  If he progresses a lot further again tomorrow, he likely can go home. If he plateaus, inpatient rehab would be an option.   Thanks!  Meredith Staggers, MD 08/24/2022

## 2022-08-24 NOTE — Progress Notes (Signed)
NEUROLOGY CONSULTATION PROGRESS NOTE   Date of service: August 24, 2022 Patient Name: John Hinton MRN:  [id number] DOB:  17-Apr-1946  Brief HPI  John Hinton is a 77 y.o. 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  LP not consistent with meningitis. Vitamin B12 and nutritional workup negative.  Suspicion for checkpoint inhibitor ataxia vs paraneoplastic. Paraneoplastic panel pending.   Interval Hx   Patient was seen this morning. Patient states he has seen much improvement in his coordination. He has been able to walk without assistance.  On exam he still exhibits mild ataxia with left arm and bilateral extremities Tolerating high dose steroids.   Vitals   Vitals:   08/23/22 0749 08/23/22 1958 08/24/22 0506 08/24/22 0800  BP: 125/68 111/67 105/81 116/83  Pulse: 62 (!) 58 (!) 59 60  Resp: 16 16 16 18   Temp: (!) 97.4 F (36.3 C) 97.7 F (36.5 C) 97.7 F (36.5 C) 97.9 F (36.6 C)  TempSrc: Oral Oral Oral Oral  SpO2: 99% 98% 98% 98%  Weight:      Height:         Body mass index is 26.2 kg/m.  Physical Exam   General: Laying comfortably in bed; in no acute distress.  HENT: Normal oropharynx and mucosa. Normal external appearance of ears and nose.  Neck: Supple, no pain or tenderness  CV: No JVD. No peripheral edema.  Pulmonary: Symmetric Chest rise. Normal respiratory effort.  Abdomen: Soft to touch, non-tender.  Ext: No cyanosis, edema, or deformity  Skin: No rash. Normal palpation of skin.   Musculoskeletal: Normal digits and  nails by inspection. No clubbing.    Neurologic Examination  Mental Status -  Level of arousal and orientation to time, place, and person were intact. Language including expression, naming, repetition, comprehension was assessed and found intact. Fund of Knowledge was assessed and was intact.   Cranial Nerves II - XII - II - Visual field intact OU. III, IV, VI - Extraocular movements intact. V - Facial sensation intact bilaterally. VII - Facial movement intact bilaterally. VIII - Hearing & vestibular intact bilaterally. X - Palate elevates symmetrically. XI - Chin turning & shoulder shrug intact bilaterally. XII - Tongue protrusion intact.   Motor:  Muscle bulk: normal, tone normal, pronator drift none tremor has an end intension tremor  Sensation: intact bilaterally   Coordination/Complex Motor:  - Finger to Nose mild ataxia with left arm, mild ataxia in bilateral lower extremities  - Gait: deferred  Labs   Basic Metabolic Panel:  Lab Results  Component Value Date   NA 142 08/24/2022   K 4.1 08/24/2022   CO2 24 08/24/2022   GLUCOSE 162 (H) 08/24/2022   BUN 29 (H) 08/24/2022   CREATININE 1.19 08/24/2022   CALCIUM 8.2 (L) 08/24/2022   GFRNONAA >60 08/24/2022   GFRAA 87 02/05/2020   HbA1c: No results found for: "HGBA1C" LDL:  Lab Results  Component Value Date   LDLCALC 77 03/03/2022   Urine Drug Screen: No results found for: "LABOPIA", "COCAINSCRNUR", "LABBENZ", "AMPHETMU", "THCU", "LABBARB"  Alcohol Level No results found for: "ETH"  No results found for: "PHENYTOIN", "ZONISAMIDE", "LAMOTRIGINE", "LEVETIRACETA" No results found for: "PHENYTOIN", "PHENOBARB", "VALPROATE", "CBMZ"  Imaging and Diagnostic studies   CT Head without contrast(Personally reviewed): CTH was negative for a large hypodensity concerning for a large territory infarct or hyperdensity concerning for an ICH   MRI Brain 08/11/22(Personally reviewed): No acute intracranial process. No evidence  of metastatic disease.   MRI total spine 08/20/22: IMPRESSION: 1. Redemonstrated metastatic disease in T1 and T10. No significant epidural extension of tumor. 2. Possible abnormal foci in the C7 vertebral body, but no evidence of epidural extension of tumor. 3. No abnormal enhancement in the spinal cord or cauda equina, although evaluation of the cauda equina is somewhat limited by the large field of view. 4. No significant spinal canal stenosis in the spine.  Impression   John Hinton is a 77 y.o. 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  Workup so far with nutritional labs non revealing or pending. Imaging not concerning for cerbellitis. LP not consistent with infection/inflammation. Most of the studies are still pending.  Suspect likely 2/2 check point inhibitor. Given symptom started a couple weeks after his first dose.  Labs: -Vitamin B12 WNL, folate WNL,  RPR WNL , HIV antibody WNL.  Cerulopasmin 20.4 B 12 794 TSH 2.954  CSF labs: ME panel negative HSV negative Protein 25 Glucose 64   Recommendations  - pending CSF studies: CSF VDRL CSF, IgG index, oligoclonal bands, CSF paraneoplastic panel, CSF cytology, CSF cultures with no growth so ar. -SPEP, copper, ceruloplasmin, vitamin E, celiac panel, zinc, vitamin B1,  Serum paraneoplastic antibody panel pending - started on IV solumedrol 1000mg  daily x 5 doses. Ataxia is improving. - High dose steroids for 5 days ( today is day 4/5)  - continue PPI while on steroids   ______________________________________________________________________  Beulah Gandy DNP, ACNPC-AG  [facility]

## 2022-08-24 NOTE — Progress Notes (Signed)
Inpatient Rehab Admissions Coordinator:   Met with patient and spouse at bedside to f/u on Dr. Charm Barges consult.  Both pt and his spouse, Pamala Hurry, feel relatively confident in pursuing d/c directly home when medically ready.  Pt ambulated 600' with mobility tech today, plus 200' with PT.  I asked therapy to address stair negotiation at next session, as this was an area pt hasn't attempted yet, and I will f/u with pt tomorrow for decision.   Shann Medal, PT, DPT Admissions Coordinator [phone] 08/24/22  4:06 PM

## 2022-08-24 NOTE — Progress Notes (Signed)
PROGRESS NOTE   John Hinton  VFI:[id number] DOB: 09/23/1945 DOA: 08/19/2022 PCP: [name], MD  Brief Narrative:   77 year old white male [place] dwelling stage IVa adenocarcinoma LUL status post resection 05/2020, + KRAS with mets to right lung adrenals and bones--follows with Dr. Cheri Kearns therapy Garnette Czech, Adagasrib increased 07/25/22-- recent start to Nivolumab 07/29/22 History of dilated cardiomyopathy 07/2022 EF 50% Also underlying PE involving right lung  last OV 07/24/2022 documents that PET scan done 12/29 showed progressive disease and meds were adjusted He did follow-up Dr Marin Olp 1/22 with very erratic driving and has been unsteady after the increase in dose of the Adagrasib--CT head was neg and an MRI 1.23 was neg for met disease  follow-up Dr Marin Olp on 1/29 it was thought that he might have a paraneoplastic syndrome and he was referred to Dr. Carles Collet neurology to be seen Patient was ultimately referred to the ED, neurology/oncology consulted  2/2 LP performed-grossly negative for infection 2/3-IV Solu-Medrol started    Hospital-Problem based course  Ataxia and incoordination-?  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 - LP culture NGTD-await all labs from 2/2 per neurology (see note 2/5), await paraneoplastic panel -Decision per oncology/neurology IV Solu-Medrol started 08/21/22--> stop date 2/6-defer to them if taper needed -Making improvements  Stage IVa adenocarcinoma Was on immunotherapy with changes in meds as above recently at [place] 07/25/2022 -?2/2 related to nivolumab/Paraneoplastic synd, deferring to Dr. Antonieta Pert expertise -Fluconazole was started by oncologist on 2/3---would discontinue as per Dr. Marin Olp -TCP is chronic since 05/2022 --per Dr. Marin Olp 2/3-he feels that there is a possibility that we may resume immunotherapy by pill form -TBD as outpatient  WBC --elevated 2/2 steroids--no fever or concerns  for superimposed infection  Prior CABG-2007, DES 03/2015 CAD with dilated cardiomyopathy-last EF 50% 07/2022 - Aspirin 81 resumed on 2/4 - Can hold Zetia - Metoprolol 12.5 XL dosing, continue Entresto twice daily  Pulmonary embolism 04/2021 - Eliquis resumed on 2/3 postprocedure  DVT prophylaxis: SCD at this time Code Status: Full code Family Communication: See note regarding family discussion on 2/2 and 2/3 Wife updated bedside Disposition:  Status is: Observation  Hopeful for CIR --may be able to complete IV steroids there?     Subjective:  Seen in room alone but looks well-laying in bed-has been able to move around some but is waiting for physical therapy to reevaluate no pain fever difficulty swallowing odynophagia Overall strength seems greatly improved  Objective: Vitals:   08/23/22 0749 08/23/22 1958 08/24/22 0506 08/24/22 0800  BP: 125/68 111/67 105/81 116/83  Pulse: 62 (!) 58 (!) 59 60  Resp: 16 16 16 18   Temp: (!) 97.4 F (36.3 C) 97.7 F (36.5 C) 97.7 F (36.5 C) 97.9 F (36.6 C)  TempSrc: Oral Oral Oral Oral  SpO2: 99% 98% 98% 98%  Weight:      Height:        Intake/Output Summary (Last 24 hours) at 08/24/2022 0942 Last data filed at 08/24/2022 4166 Gross per 24 hour  Intake 240 ml  Output 925 ml  Net -685 ml    Filed Weights   08/19/22 1417 08/22/22 0442  Weight: 81.6 kg 85.2 kg    Examination:  EOMI NCAT  Chest no wheeze rales rhonchi S1-S2 no murmur --- Neuro exam grossly unchanged from 2/4 Finger nose finger altered on LUE Similarly Shin-heel altered LLE Cold touch deferred Chest is clear no added sound no wheeze no rales no rhonchi S1-S2 no murmur  Data Reviewed: personally reviewed   CBC    Component Value Date/Time   WBC 11.7 (H) 08/24/2022 0345   RBC 3.16 (L) 08/24/2022 0345   HGB 10.4 (L) 08/24/2022 0345   HGB 11.5 (L) 08/17/2022 0842   HGB 13.7 01/27/2017 0834   HCT 32.0 (L) 08/24/2022 0345   HCT 39.2 01/27/2017 0834    PLT 118 (L) 08/24/2022 0345   PLT 106 (L) 08/17/2022 0842   PLT 165 01/27/2017 0834   MCV 101.3 (H) 08/24/2022 0345   MCV 98 (H) 01/27/2017 0834   MCH 32.9 08/24/2022 0345   MCHC 32.5 08/24/2022 0345   RDW 14.6 08/24/2022 0345   RDW 12.9 01/27/2017 0834   LYMPHSABS 0.5 (L) 08/21/2022 0546   LYMPHSABS 1.0 01/27/2017 0834   MONOABS 0.6 08/21/2022 0546   EOSABS 0.0 08/21/2022 0546   EOSABS 0.1 01/27/2017 0834   BASOSABS 0.0 08/21/2022 0546   BASOSABS 0.0 01/27/2017 0834      Latest Ref Rng & Units 08/24/2022    3:45 AM 08/23/2022    2:15 AM 08/20/2022    4:10 AM  CMP  Glucose 70 - 99 mg/dL 162  197  117   BUN 8 - 23 mg/dL 29  25  22    Creatinine 0.61 - 1.24 mg/dL 1.19  1.10  1.14   Sodium 135 - 145 mmol/L 142  137  142   Potassium 3.5 - 5.1 mmol/L 4.1  3.5  4.3   Chloride 98 - 111 mmol/L 109  109  107   CO2 22 - 32 mmol/L 24  19  24    Calcium 8.9 - 10.3 mg/dL 8.2  8.1  8.9      Radiology Studies: No results found.   Scheduled Meds:  apixaban  5 mg Oral BID   aspirin EC  81 mg Oral Daily   fluconazole  100 mg Oral Daily   folic acid  1 mg Oral Daily   levothyroxine  50 mcg Oral QAC breakfast   metoprolol succinate  12.5 mg Oral QHS   multivitamin with minerals  1 tablet Oral Daily   pantoprazole  40 mg Oral Daily   rosuvastatin  40 mg Oral QHS   sacubitril-valsartan  1 tablet Oral BID   sertraline  100 mg Oral BID   thiamine (VITAMIN B1) injection  100 mg Intravenous Daily   Or   thiamine  100 mg Oral Daily   Continuous Infusions:  methylPREDNISolone (SOLU-MEDROL) injection 1,000 mg (08/23/22 1425)     LOS: 4 days   Time spent: 24  Nita Sells, MD [facility] To contact the attending provider between 7A-7P or the covering provider during after hours 7P-7A, please log into the web site [email or web address] and access using universal [place] password for that web site. If you do not have the password, please call the hospital operator.  08/24/2022, 9:42  AM

## 2022-08-24 NOTE — Progress Notes (Signed)
John Hinton send have a good weekend.  Physical therapy worked with him.  Occupational Therapy worked with him.  Maybe, he could be a candidate for inpatient rehab.  He is on the high-dose steroids.  He feels that this might be helping a little bit.  We are still awaiting some of the results from the spinal fluid.  We will see what the cytology shows.  I see that his appetite might be a little bit better.  He is not complaining of any pain.  There is no bowel or bladder incontinence.  He still has little bit of a tremor, more so on the right side.  He has had no fever.  There is no bleeding.  He has had no cough or shortness of breath.  His vital signs are temperature of 97.7.  Pulse 59.  Blood pressure 105/81.  His head and neck exam shows no ocular or oral lesions.  There are no palpable cervical or supraclavicular lymph nodes.  Lungs are clear bilaterally.  Cardiac exam regular rate and rhythm.  Abdomen is soft.  Bowel sounds are present.  Extremities shows good strength in upper and lower extremities.  He has good range of motion of his joints.  Neurological exam shows no focal deficits.  He still has his tremor, more so on the right side.  We are still in the process of working up this neurological compromise.  It might be from his immunotherapy.  Again this would be very unusual.  It could also be a paraneoplastic process.  The studies have been sent off but will take at least 10-14 days before they come back.  I think it be reasonable to restart the adagrasib.  Again, the incidence of neurological toxicity is probably less than 1% with respect to gait and balance issues.  Will be interesting to see if he would qualify for inpatient rehab.  I do appreciate the incredible care that he is getting from everybody up on [address].   Lattie Haw, MD  Psalm 121:2

## 2022-08-24 NOTE — Progress Notes (Signed)
Physical Therapy Treatment Patient Details Name: John Hinton MRN: [id number] DOB: 01/21/1946 Today's Date: 08/24/2022   History of Present Illness Pt is a 77 y.o. male admitted 08/19/22 with new onset weakness, confusion, and ataxia. CTH and MRI negative. LP negative. Pt started on IV solumedrol. PMH:  stage 4 adenocarcinoma of lung with mets to lungs, B adrenal glands, bone.  Prior PE without RHS on chronic eliquis, BPH, HTN, dilated cardiomyopathy.    PT Comments    Pt admitted with above diagnosis. Pt with much improved gait today needing min assist (+1 for chair follow for safety). Pt continues with some ataxia but did not have as many LOB and pt able to self correct much of the time. Pt also incr distance today. Continues to need cues for safety as he doesn't have the best safety awarness at times. Will continue to follow acutely.  Pt currently with functional limitations due to balance and endurance deficits. Pt will benefit from skilled PT to increase their independence and safety with mobility to allow discharge to the venue listed below.      Recommendations for follow up therapy are one component of a multi-disciplinary discharge planning process, led by the attending physician.  Recommendations may be updated based on patient status, additional functional criteria and insurance authorization.  Follow Up Recommendations  Acute inpatient rehab (3hours/day)     Assistance Recommended at Discharge Frequent or constant Supervision/Assistance  Patient can return home with the following Assist for transportation;Help with stairs or ramp for entrance;A lot of help with bathing/dressing/bathroom;A lot of help with walking and/or transfers;Assistance with cooking/housework;Direct supervision/assist for medications management   Equipment Recommendations  Rolling walker (2 wheels);BSC/3in1    Recommendations for Other Services Rehab consult     Precautions / Restrictions  Precautions Precautions: Fall Restrictions Weight Bearing Restrictions: No     Mobility  Bed Mobility Overal bed mobility: Needs Assistance Bed Mobility: Supine to Sit, Sit to Supine     Supine to sit: Min guard Sit to supine: Min guard   General bed mobility comments: min guard A for safety    Transfers Overall transfer level: Needs assistance Equipment used: Rolling walker (2 wheels) Transfers: Sit to/from Stand Sit to Stand: Min assist           General transfer comment: Assist for steadying after power up    Ambulation/Gait Ambulation/Gait assistance: Min assist, +2 safety/equipment Gait Distance (Feet): 200 Feet Assistive device: Rolling walker (2 wheels) Gait Pattern/deviations: Step-through pattern, Decreased stride length, Wide base of support, Ataxic, Drifts right/left Gait velocity: decreased Gait velocity interpretation: <1.8 ft/sec, indicate of risk for recurrent falls   General Gait Details: assist to manage RW and maintain balance occasonally.  Pt with much better gait today than last treatment with incr steadiness overall.   3/4 DOE. SpO2 95% on RA.   Stairs             Wheelchair Mobility    Modified Rankin (Stroke Patients Only)       Balance Overall balance assessment: Needs assistance Sitting-balance support: Feet supported, No upper extremity supported Sitting balance-Leahy Scale: Fair     Standing balance support: Reliant on assistive device for balance, During functional activity, Bilateral upper extremity supported Standing balance-Leahy Scale: Poor Standing balance comment: MIn guard assist for static standing balance, min assist with challenges  Cognition Arousal/Alertness: Awake/alert Behavior During Therapy: WFL for tasks assessed/performed Overall Cognitive Status: Impaired/Different from baseline Area of Impairment: Attention, Memory, Safety/judgement, Awareness, Problem solving                    Current Attention Level: Selective Memory: Decreased short-term memory   Safety/Judgement: Decreased awareness of safety Awareness: Emergent Problem Solving: Slow processing, Difficulty sequencing, Requires verbal cues          Exercises General Exercises - Lower Extremity Ankle Circles/Pumps: AROM, Both, 10 reps, Supine Long Arc Quad: AROM, Both, 10 reps, Seated Heel Slides: AROM, Both, 10 reps, Supine Hip Flexion/Marching: AROM, Both, 10 reps, Seated    General Comments General comments (skin integrity, edema, etc.): VSS      Pertinent Vitals/Pain Pain Assessment Pain Assessment: No/denies pain    Home Living                          Prior Function            PT Goals (current goals can now be found in the care plan section) Acute Rehab PT Goals Patient Stated Goal: home Progress towards PT goals: Progressing toward goals    Frequency    Min 4X/week      PT Plan Current plan remains appropriate    Co-evaluation              AM-PAC PT "6 Clicks" Mobility   Outcome Measure  Help needed turning from your back to your side while in a flat bed without using bedrails?: None Help needed moving from lying on your back to sitting on the side of a flat bed without using bedrails?: A Little Help needed moving to and from a bed to a chair (including a wheelchair)?: A Little Help needed standing up from a chair using your arms (e.g., wheelchair or bedside chair)?: A Little Help needed to walk in hospital room?: A Lot Help needed climbing 3-5 steps with a railing? : Total 6 Click Score: 16    End of Session Equipment Utilized During Treatment: Gait belt Activity Tolerance: Patient tolerated treatment well Patient left: with call bell/phone within reach;with family/visitor present;in chair;with chair alarm set Nurse Communication: Mobility status PT Visit Diagnosis: Unsteadiness on feet (R26.81);Ataxic gait (R26.0)      Time: 7544-9201 PT Time Calculation (min) (ACUTE ONLY): 23 min  Charges:  $Gait Training: 8-22 mins $Therapeutic Exercise: 8-22 mins                     [facility] M,PT Acute Rehab Services [phone]    Alvira Philips 08/24/2022, 11:07 AM

## 2022-08-24 NOTE — Progress Notes (Addendum)
Inpatient Rehab Admissions Coordinator:   Per therapy recommendations,  patient was screened for CIR candidacy by Clemens Catholic, MS, CCC-SLP. At this time, Pt. Appears to be a a potential candidate for CIR. I will place   order for rehab consult per protocol for full assessment. Note I'V Solumedrol to finish 2/7. Please contact me any with questions.  Clemens Catholic, [place], [place] Admissions Coordinator  [phone] ([place]) [phone] (office)

## 2022-08-24 NOTE — Progress Notes (Signed)
Mobility Specialist Progress Note:   08/24/22 1442  Mobility  Activity Ambulated with assistance in hallway  Level of Assistance Standby assist, set-up cues, supervision of patient - no hands on  Assistive Device Front wheel walker  Distance Ambulated (ft) 600 ft  Activity Response Tolerated well  $Mobility charge 1 Mobility   Pt in bed willing to participate in mobility. No complaints of pain. Left in bed with call bell in reach and all needs met.   Gareth Eagle Amadeo Coke Mobility Specialist Please contact via [employer] or  Rehab Office at [phone]

## 2022-08-24 NOTE — Care Management Important Message (Signed)
Important Message  Patient Details  Name: John Hinton MRN: [id number] Date of Birth: 1945-10-06   Medicare Important Message Given:  Yes     Orbie Pyo 08/24/2022, 2:22 PM

## 2022-08-25 ENCOUNTER — Encounter: Payer: Self-pay | Admitting: Hematology & Oncology

## 2022-08-25 ENCOUNTER — Encounter: Payer: Self-pay | Admitting: *Deleted

## 2022-08-25 DIAGNOSIS — R270 Ataxia, unspecified: Secondary | ICD-10-CM | POA: Diagnosis not present

## 2022-08-25 LAB — VDRL, CSF: VDRL Quant, CSF: NONREACTIVE

## 2022-08-25 LAB — CBC
HCT: 34.1 % — ABNORMAL LOW (ref 39.0–52.0)
Hemoglobin: 11.5 g/dL — ABNORMAL LOW (ref 13.0–17.0)
MCH: 33.8 pg (ref 26.0–34.0)
MCHC: 33.7 g/dL (ref 30.0–36.0)
MCV: 100.3 fL — ABNORMAL HIGH (ref 80.0–100.0)
Platelets: 131 10*3/uL — ABNORMAL LOW (ref 150–400)
RBC: 3.4 MIL/uL — ABNORMAL LOW (ref 4.22–5.81)
RDW: 14.5 % (ref 11.5–15.5)
WBC: 11.2 10*3/uL — ABNORMAL HIGH (ref 4.0–10.5)
nRBC: 0 % (ref 0.0–0.2)

## 2022-08-25 LAB — BASIC METABOLIC PANEL
Anion gap: 8 (ref 5–15)
BUN: 26 mg/dL — ABNORMAL HIGH (ref 8–23)
CO2: 24 mmol/L (ref 22–32)
Calcium: 7.9 mg/dL — ABNORMAL LOW (ref 8.9–10.3)
Chloride: 107 mmol/L (ref 98–111)
Creatinine, Ser: 0.95 mg/dL (ref 0.61–1.24)
GFR, Estimated: >60 mL/min (ref >60)
Glucose, Bld: 178 mg/dL — ABNORMAL HIGH (ref 70–99)
Potassium: 3.6 mmol/L (ref 3.5–5.1)
Sodium: 139 mmol/L (ref 135–145)

## 2022-08-25 LAB — IGG CSF INDEX
Albumin CSF-mCnc: 15 mg/dL (ref 15–55)
Albumin: 3.9 g/dL (ref 3.8–4.8)
CSF IgG Index: 0.8 — ABNORMAL HIGH (ref 0.0–0.7)
IgG (Immunoglobin G), Serum: 535 mg/dL — ABNORMAL LOW (ref 603–1613)
IgG, CSF: 1.7 mg/dL (ref 0.0–10.3)
IgG/Alb Ratio, CSF: 0.11 (ref 0.00–0.25)

## 2022-08-25 LAB — VITAMIN B1: Vitamin B1 (Thiamine): 139.1 nmol/L (ref 66.5–200.0)

## 2022-08-25 MED ORDER — PANTOPRAZOLE SODIUM 40 MG PO TBEC: 40.0000 mg | DELAYED_RELEASE_TABLET | Freq: Every day | ORAL | 3 refills | Status: DC

## 2022-08-25 NOTE — Plan of Care (Signed)
Completed 5 days of steroids. Needs outpatient follow-up with neuromuscular neurology-Dr. Posey Pronto at [facility] neurology for evaluation and EMG/nerve conduction studies. Continued follow-up with oncology Please call inpatient neurology service as needed  -- Amie Portland, MD Neurologist [facility] Pager: [phone]

## 2022-08-25 NOTE — Progress Notes (Signed)
Looks like things are improving for John Hinton.  He seemed to walk better.  He has been evaluated by Inpatient Rehab.  He would be a candidate but he and his wife feel that he will be able to go home.  So far, the cytology on the CSF has not yet come back.  He is on the high-dose steroids.  He feels he is able to walk a little bit better.  His appetite has been doing good.  He has had no nausea or vomiting.  There is no abdominal pain.  He has had no cough or shortness of breath.  We still have him off the adagrasib.  We will probably get this started when he goes home.  He has had no change in bowel or bladder habits.  There is been no diarrhea.  He has had no rashes.  There has been no bleeding.  His labs yesterday showed a white cell count 11.7.  Hemoglobin 10.4.  Platelet count 118,000.  His blood sugar was 162.  BUN 29 creatinine 1.19.  His vital signs are temperature 98.  Pulse 68.  Blood pressure 130/78.  Weight is 187 pounds.  His head and neck exam shows no ocular or oral lesions.  He has no thrush.  There is no adenopathy in the neck.  Lungs are clear bilaterally.  Cardiac exam regular rate and rhythm.  Abdomen is soft.  Bowel sounds are present.  He has no guarding or rebound tenderness.  Extremity shows no clubbing, cyanosis or edema.  It does not look like he has as much of a tremor however on the right side.  He does have decent strength.  Neurological exam is nonfocal.  Hopefully, the cytology on the CSF will come back negative.  I would think that the cytology should be back soon.  I am glad that he is improving.  Again is not clear whether the neurological changes are from the immunotherapy or from a  possible paraneoplastic process.  I am just happy that his quality life is improving.  I know that he has had tremendous care from everybody up on [address].   Lattie Haw, MD  Hebrews 13:6

## 2022-08-25 NOTE — Discharge Summary (Signed)
Physician Discharge Summary  John Hinton UXL:[id number] DOB: March 02, 1946 DOA: 08/19/2022  PCP: [name], MD  Admit date: 08/19/2022 Discharge date: 08/25/2022  Time spent: 47 minutes  Recommendations for Outpatient Follow-up:  Stop Adagrasib completely until follows up with Dr. Marin Olp in the outpatient setting Prednisone dosing at usual home doses as per neurology, requires [email or web address]  Dr. 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 Carles Collet neurology to be seen--it is unclear whether he did follow-up- Patient was ultimately referred to the ED, neurology consulted   2/2 LP performed   Hospital-Problem based course   Ataxia and incoordination - MRI brain negative for occult disease - Z36, folic acid ceruloplasmin negative within normal limits, HIV negative-await other labs from 1/31 - prelim LP not overly concerning for infection--please follow-up pending outstanding labs for workup -Patient has improved to the point that he can get outpatient therapy    Stage IVa adenocarcinoma - Chemotherapy on hold at this time -?2/2 related to nivolumab/Paraneoplastic synd, deferring to Dr. Antonieta Pert expertise - high-dose steroids started on 2/2 PM -Fluconazole was started by oncologist on 2/3 but discontinued after high-dose steroids had been finished on discharge 2/6 ---Patient discussed in detail over phone with Dr. Marin Olp on 2/3-he feels that there is a possibility that we may resume immunotherapy by pill form-I have discussed collective concerns of wife and mine with regards to cognition  Prior CABG-2007, DES 03/2015 CAD with dilated cardiomyopathy-last EF 50% 07/2022 - Would hold aspirin 81 from am of 08/21/22 as per Neuro, resume post procedure - Can hold Zetia - Metoprolol  resume at 12.5 XL dosing, continue Entresto twice daily   Pulmonary embolism 04/2021 - Eliquis resumed on 2/3 postprocedure   Discharge Exam: Vitals:   08/25/22 0500 08/25/22 0728  BP: 130/78 129/76  Pulse: 68 61  Resp: 16   Temp: 98 F (36.7 C) 98.1 F (36.7 C)  SpO2: 98% 98%    Subj on day of d/c   To wife patient appears to be at 80 to 85% of his normal function He does not  seem confused to me-affect is nonlabile He is moving all 4 limbs equally he has good praxis of movement power is 5/5 reflexes are slightly brisk 2/3 in knees and in brachioradialis I did not examine gait Chest is clear no rales no  rhonchi S1-S2 no murmur no rub no gallop Neck is soft and supple   Discharge Instructions   Discharge Instructions     Ambulatory referral to Neurology   Complete by: As directed    An appointment is requested in approximately: 4 weeks  Requires EMG studies etc.   Diet - low sodium heart healthy   Complete by: As directed    Discharge instructions   Complete by: As directed    This hospitalization you were diagnosed with either neurotoxicity versus paraneoplastic syndrome presenting with the spasticity as well as behavioral changes  You will not require any further changes to your steroids-please just take your usual 10 mg of prednisone at home You can continue all of your usual home medications as we have prescribed previously--- would however hold off on the Adagrasib at this time and instead follow-up with Dr. Marin Olp regarding adjustments of dosing in the outpatient setting   Increase activity slowly   Complete by: As directed       Allergies as of 08/25/2022       Reactions   Amlodipine Swelling   Swelling of ankles        Medication List     STOP taking these medications    adagrasib 200 MG tablet Commonly known as: KRAZATI   cholecalciferol 1000 units tablet Commonly known as: VITAMIN D       TAKE these medications    aspirin 81 MG tablet Take 81 mg by mouth at bedtime.   B-12 PO Take 1,000 mcg by mouth daily.   colchicine 0.6 MG tablet Take 1 tablet (0.6 mg total) by mouth every 8 (eight) hours as needed.   diphenoxylate-atropine 2.5-0.025 MG tablet Commonly known as: LOMOTIL TAKE 2 TABLETS BY MOUTH AT ONSET OF DIARRHEA THEN TAKE 1 TABLET AFTER EACH LOOSE STOOL MAX 8 TABLETS PER DAY   Eliquis 5 MG Tabs tablet Generic drug: apixaban TAKE 1 TABLET BY MOUTH TWICE A DAY   Entresto 24-26 MG Generic drug: sacubitril-valsartan TAKE ONE TABLET BY MOUTH TWICE A DAY   ezetimibe 10 MG tablet Commonly known as: ZETIA Take 1 tablet (10 mg total) by  mouth daily.   folic acid 1 MG tablet Commonly known as: FOLVITE TAKE ONE TABLET BY MOUTH DAILY START 5 TO 7 DAYS PRIOR TO CHEMO AND CONTINUE UNTIL 21 DAYS AFTER COMPLETES   levothyroxine 50 MCG tablet Commonly known as: Synthroid Take 1 tablet (50 mcg total) by mouth daily before breakfast.   metoprolol succinate 25 MG 24 hr tablet Commonly known as: TOPROL-XL Take 1 tablet (25 mg total) by mouth at bedtime.   multivitamin capsule Take 1 capsule by mouth daily.   nitroGLYCERIN 0.4 MG SL tablet Commonly known as: NITROSTAT Place 1 tablet (0.4 mg total) under the tongue every 5 (five) minutes as needed for chest pain.   oxyCODONE-acetaminophen 7.5-325 MG tablet Commonly known as: Percocet Take 1 tablet by mouth every 4 (four) hours as needed for severe pain.   pantoprazole 40 MG tablet Commonly known as: PROTONIX Take 1 tablet (40 mg total) by mouth daily. Start taking on: August 26, 2022   predniSONE 10 MG tablet Commonly known as: DELTASONE Take 1 tablet (10 mg total) by mouth daily  with breakfast.   rosuvastatin 40 MG tablet Commonly known as: CRESTOR TAKE ONE TABLET BY MOUTH EVERY NIGHT AT BEDTIME   sertraline 100 MG tablet Commonly known as: ZOLOFT Take 100 mg by mouth 2 (two) times daily.   sucralfate 1 g tablet Commonly known as: Carafate Take 1 tablet (1 g total) by mouth 4 (four) times daily. Dissolve each tablet in 15 cc water before use. What changed:  when to take this reasons to take this   traMADol 50 MG tablet Commonly known as: ULTRAM Take 1 tablet (50 mg total) by mouth every 6 (six) hours as needed.       Allergies  Allergen Reactions   Amlodipine Swelling    Swelling of ankles      The results of significant diagnostics from this hospitalization (including imaging, microbiology, ancillary and laboratory) are listed below for reference.    Significant Diagnostic Studies: MR TOTAL SPINE METS SCREENING  Result Date:  08/20/2022 CLINICAL DATA:  Lung cancer, evaluate for metastatic disease EXAM: MRI TOTAL SPINE WITHOUT AND WITH CONTRAST TECHNIQUE: Multisequence MR imaging of the spine from the cervical spine to the sacrum was performed prior to and following IV contrast administration for evaluation of spinal metastatic disease. CONTRAST:  68mL GADAVIST GADOBUTROL 1 MMOL/ML IV SOLN COMPARISON:  MRI thoracic and lumbar spine 05/03/2021, correlation is also made with head CT 07/17/2022 FINDINGS: MRI CERVICAL SPINE FINDINGS Alignment: No significant listhesis. Vertebrae: Heterogeneous marrow signal without significant abnormal T2 hyperintense signal or enhancement. Possible abnormal foci are seen C7 vertebral body (series 27, image 8 and 10), but no evidence of epidural extension of tumor. Cord: No abnormal enhancement.  Normal signal. Posterior Fossa, vertebral arteries, paraspinal tissues: Grossly normal. Not well evaluated given the focus of this study Disc levels: No significant spinal canal stenosis. MRI THORACIC SPINE FINDINGS Alignment:  No listhesis. Vertebrae: Decreased T1 signal, increased T2 signal, and abnormal contrast enhancement in the T1 vertebral body. No significant epidural extension of tumor. Suspect post radiation changes in the adjacent vertebral bodies. Similar abnormal signal in the T10 vertebral body. No epidural extension of tumor. Cord:  Normal signal and morphology.  No abnormal enhancement. Paraspinal and other soft tissues: Grossly normal. Not well evaluated given the focus of the study. Disc levels: No significant spinal canal stenosis. MRI LUMBAR SPINE FINDINGS Segmentation:  5 lumbar type vertebral bodies. Alignment: No listhesis. Straightening of the normal lumbar lordosis. Vertebrae:  No significant abnormal signal or enhancement. Conus medullaris: Extends to the L1 level and appears normal. No abnormal conus or cauda equina enhancement, although evaluation of the cauda equina is somewhat limited by  the large field of view. Paraspinal and other soft tissues: Grossly normal, although not well evaluated given the focus of this study. Disc levels: No significant spinal canal stenosis. IMPRESSION: 1. Redemonstrated metastatic disease in T1 and T10. No significant epidural extension of tumor. 2. Possible abnormal foci in the C7 vertebral body, but no evidence of epidural extension of tumor. 3. No abnormal enhancement in the spinal cord or cauda equina, although evaluation of the cauda equina is somewhat limited by the large field of view. 4. No significant spinal canal stenosis in the spine. Electronically Signed   By: Merilyn Baba M.D.   On: 08/20/2022 20:37   CT HEAD WO CONTRAST (5MM)  Result Date: 08/19/2022 CLINICAL DATA:  Weakness EXAM: CT HEAD WITHOUT CONTRAST TECHNIQUE: Contiguous axial images were obtained from the base of the skull through the vertex without intravenous contrast.  RADIATION DOSE REDUCTION: This exam was performed according to the departmental dose-optimization program which includes automated exposure control, adjustment of the mA and/or kV according to patient size and/or use of iterative reconstruction technique. COMPARISON:  08/10/2022 FINDINGS: Brain: No evidence of acute infarction, hemorrhage, hydrocephalus, extra-axial collection or mass lesion/mass effect. Mild atrophic changes are noted. Vascular: No hyperdense vessel or unexpected calcification. Skull: Normal. Negative for fracture or focal lesion. Sinuses/Orbits: No acute finding. Other: None. IMPRESSION: No acute intracranial abnormality noted. Mild atrophic changes are noted. Electronically Signed   By: Inez Catalina M.D.   On: 08/19/2022 18:05   DG Chest Port 1 View  Result Date: 08/19/2022 CLINICAL DATA:  Shortness of breath. Worsening shortness of breath and weakness. EXAM: PORTABLE CHEST 1 VIEW COMPARISON:  Radiograph 2 days ago 08/09/2022. PET CT 08/17/2021 FINDINGS: Prior median sternotomy and CABG. Stable heart  size and mediastinal contours. Postsurgical volume loss in the left hemithorax. Small left pleural effusion and basilar opacity is similar to recent imaging. Left pleural thickening was seen on recent PET. The right lung is clear. No pulmonary edema. No pneumothorax. IMPRESSION: 1. Stable radiographic appearance of the chest. Left pleural effusion/thickening and basilar opacity is similar to recent imaging. 2. Prior CABG. Electronically Signed   By: Keith Rake M.D.   On: 08/19/2022 17:54   DG Chest 2 View  Result Date: 08/17/2022 CLINICAL DATA:  Shortness of breath.  History of lung cancer EXAM: CHEST - 2 VIEW COMPARISON:  PET-CT 07/17/2022 FINDINGS: Stable cardiac enlargement. Previous median sternotomy and CABG procedure. Asymmetric left lung volume loss is identified status post left upper lobectomy. Blunting of the left costophrenic angle is identified compatible with previously noted loculated pleural fluid with overlying tracer avid pleural thickening. No acute abnormality noted. Right lung appears clear. IMPRESSION: 1. No acute cardiopulmonary abnormalities. 2. Stable cardiac enlargement. 3. Status post left upper lobectomy with chronic small loculated pleural effusion and tracer avid pleural thickening. Electronically Signed   By: Kerby Moors M.D.   On: 08/17/2022 11:14   MR Brain W Wo Contrast  Result Date: 08/11/2022 CLINICAL DATA:  Non-small cell lung cancer, worsening invalid EXAM: MRI HEAD WITHOUT AND WITH CONTRAST TECHNIQUE: Multiplanar, multiecho pulse sequences of the brain and surrounding structures were obtained without and with intravenous contrast. CONTRAST:  8.76mL GADAVIST GADOBUTROL 1 MMOL/ML IV SOLN COMPARISON:  06/02/2022 FINDINGS: Brain: No restricted diffusion to suggest acute or subacute infarct. No abnormal parenchymal or meningeal enhancement. No acute hemorrhage, mass, mass effect, or midline shift. No hydrocephalus or extra-axial collection. Normal pituitary and  craniocervical junction. Scattered T2 hyperintense signal in the periventricular white matter, likely the sequela of minimal chronic small vessel ischemic disease. Vascular: Patent arterial flow voids. Normal arterial and venous enhancement. Skull and upper cervical spine: Normal marrow signal. Sinuses/Orbits: Mucosal thickening in the maxillary sinuses and ethmoid air cells. No acute finding in the orbits. No acute finding in the orbits. Other: The mastoid air cells are well aerated. IMPRESSION: No acute intracranial process. No evidence of metastatic disease. Electronically Signed   By: Merilyn Baba M.D.   On: 08/11/2022 19:58   CT HEAD W & WO CONTRAST (5MM)  Result Date: 08/10/2022 CLINICAL DATA:  Altered mental status. Nontraumatic. History of lung cancer. EXAM: CT HEAD WITHOUT AND WITH CONTRAST TECHNIQUE: Contiguous axial images were obtained from the base of the skull through the vertex without and with intravenous contrast. RADIATION DOSE REDUCTION: This exam was performed according to the departmental dose-optimization program which  includes automated exposure control, adjustment of the mA and/or kV according to patient size and/or use of iterative reconstruction technique. CONTRAST:  34mL OMNIPAQUE IOHEXOL 300 MG/ML  SOLN COMPARISON:  MRI 06/02/2022 FINDINGS: Brain: The brain shows a normal appearance without evidence of malformation, atrophy, old or acute small or large vessel infarction, mass lesion, hemorrhage, hydrocephalus or extra-axial collection. After contrast administration, no abnormal enhancement occurs. Vascular: There is atherosclerotic calcification of the major vessels at the base of the brain. Skull: Normal.  No traumatic finding.  No focal bone lesion. Sinuses/Orbits: Sinuses are clear. Orbits appear normal. Mastoids are clear. Other: None significant IMPRESSION: 1. No acute or significant finding. Normal appearance of the brain itself. No evidence of metastatic disease. No abnormal  enhancement. Electronically Signed   By: Nelson Chimes M.D.   On: 08/10/2022 15:01   ECHOCARDIOGRAM COMPLETE  Result Date: 08/08/2022    ECHOCARDIOGRAM REPORT   Patient Name:   John Hinton Date of Exam: 08/07/2022 Medical Rec #:  [id number]        Height:       71.0 in Accession #:    [id number]       Weight:       185.0 lb Date of Birth:  Apr 30, 1946        BSA:          2.040 m Patient Age:    74 years         BP:           101/65 mmHg Patient Gender: M                HR:           82 bpm. Exam Location:  Outpatient Procedure: 2D Echo, Cardiac Doppler, Color Doppler and Strain Analysis Indications:    Chemo Z09  History:        Patient has prior history of Echocardiogram examinations, most                 recent 05/05/2021. Cardiomyopathy, CAD, Arrythmias:RBBB and                 Bradycardia, Signs/Symptoms:Dyspnea; Risk Factors:Sleep Apnea,                 Hypertension and Dyslipidemia.  Sonographer:    Bernadene Person RDCS Referring Phys: [place] Comments: Global longitudinal strain was attempted. IMPRESSIONS  1. Left ventricular ejection fraction, by estimation, is 50%. The left ventricle has mildly decreased function. The left ventricle demonstrates regional wall motion abnormalities (see scoring diagram/findings for description). There is mild asymmetric left ventricular hypertrophy of the septal segment. Left ventricular diastolic parameters are indeterminate. There is abnormal septal motion likely secondary to conduction delay.  2. Right ventricular systolic function is mildly reduced. The right ventricular size is mildly enlarged. There is normal pulmonary artery systolic pressure. The estimated right ventricular systolic pressure is 50.0 mmHg.  3. Left atrial size was mildly dilated.  4. The mitral valve is grossly normal. Mild mitral valve regurgitation. No evidence of mitral stenosis.  5. The aortic valve is abnormal. There is moderate calcification of the aortic valve  (noncoronary cusp). Aortic valve regurgitation is not visualized. No aortic stenosis is present.  6. The inferior vena cava is normal in size with greater than 50% respiratory variability, suggesting right atrial pressure of 3 mmHg. FINDINGS  Left Ventricle: Left ventricular ejection fraction, by estimation, is 50%. The left ventricle has mildly decreased function.  The left ventricle demonstrates regional wall motion abnormalities. Global longitudinal strain performed but not reported based on interpreter judgement due to suboptimal tracking. The left ventricular internal cavity size was normal in size. There is mild asymmetric left ventricular hypertrophy of the septal segment. Abnormal septal motion likely secondary to conduction delay. Left ventricular diastolic parameters are indeterminate.  LV Wall Scoring: The posterior wall is hypokinetic. Right Ventricle: The right ventricular size is mildly enlarged. No increase in right ventricular wall thickness. Right ventricular systolic function is mildly reduced. There is normal pulmonary artery systolic pressure. The tricuspid regurgitant velocity  is 2.59 m/s, and with an assumed right atrial pressure of 3 mmHg, the estimated right ventricular systolic pressure is 99.8 mmHg. Left Atrium: Left atrial size was mildly dilated. Right Atrium: Right atrial size was normal in size. Pericardium: There is no evidence of pericardial effusion. Mitral Valve: The mitral valve is grossly normal. Mild mitral valve regurgitation. No evidence of mitral valve stenosis. Tricuspid Valve: The tricuspid valve is normal in structure. Tricuspid valve regurgitation is mild . No evidence of tricuspid stenosis. Aortic Valve: The aortic valve is abnormal. There is moderate calcification of the aortic valve. Aortic valve regurgitation is not visualized. No aortic stenosis is present. Pulmonic Valve: The pulmonic valve was normal in structure. Pulmonic valve regurgitation is trivial. No evidence  of pulmonic stenosis. Aorta: The aortic root and ascending aorta are structurally normal, with no evidence of dilitation. Ascending aorta measurements are within normal limits for age when indexed to body surface area. Venous: The inferior vena cava is normal in size with greater than 50% respiratory variability, suggesting right atrial pressure of 3 mmHg. IAS/Shunts: No atrial level shunt detected by color flow Doppler.  LEFT VENTRICLE PLAX 2D LVIDd:         6.00 cm LVIDs:         4.60 cm LV PW:         0.90 cm LV IVS:        1.00 cm LVOT diam:     2.30 cm LV SV:         59 LV SV Index:   29 LVOT Area:     4.15 cm  LV Volumes (MOD) LV vol d, MOD A2C: 86.4 ml LV vol d, MOD A4C: 94.3 ml LV vol s, MOD A2C: 42.9 ml LV vol s, MOD A4C: 43.1 ml LV SV MOD A2C:     43.5 ml LV SV MOD A4C:     94.3 ml LV SV MOD BP:      46.6 ml RIGHT VENTRICLE RV S prime:     5.43 cm/s TAPSE (M-mode): 1.1 cm LEFT ATRIUM             Index        RIGHT ATRIUM           Index LA diam:        5.00 cm 2.45 cm/m   RA Area:     16.60 cm LA Vol (A2C):   68.6 ml 33.63 ml/m  RA Volume:   40.90 ml  20.05 ml/m LA Vol (A4C):   70.5 ml 34.56 ml/m LA Biplane Vol: 72.7 ml 35.64 ml/m  AORTIC VALVE LVOT Vmax:   73.15 cm/s LVOT Vmean:  48.000 cm/s LVOT VTI:    0.143 m  AORTA Ao Root diam: 3.90 cm Ao Asc diam:  3.70 cm TRICUSPID VALVE TR Peak grad:   26.8 mmHg TR Vmax:  259.00 cm/s  SHUNTS Systemic VTI:  0.14 m Systemic Diam: 2.30 cm Cherlynn Kaiser MD Electronically signed by Cherlynn Kaiser MD Signature Date/Time: 08/08/2022/2:33:01 PM    Final     Microbiology: Recent Results (from the past 240 hour(s))  CSF culture w Gram Stain     Status: None   Collection Time: 08/21/22  1:37 PM   Specimen: CSF; Cerebrospinal Fluid  Result Value Ref Range Status   Specimen Description CSF  Final   Special Requests NONE  Final   Gram Stain NO WBC SEEN NO ORGANISMS SEEN CYTOSPIN SMEAR   Final   Culture   Final    NO GROWTH 3 DAYS Performed at  [facility], [address] [place], [place] [address]    Report Status 08/24/2022 FINAL  Final     Labs: Basic Metabolic Panel: Recent Labs  Lab 08/19/22 1438 08/20/22 0410 08/23/22 0215 08/24/22 0345 08/25/22 0633  NA 143 142 137 142 139  K 3.1* 4.3 3.5 4.1 3.6  CL 108 107 109 109 107  CO2 27 24 19* 24 24  GLUCOSE 115* 117* 197* 162* 178*  BUN 21 22 25* 29* 26*  CREATININE 1.21 1.14 1.10 1.19 0.95  CALCIUM 8.8* 8.9 8.1* 8.2* 7.9*  MG 2.6*  --   --   --   --   PHOS 3.3  --   --   --   --    Liver Function Tests: Recent Labs  Lab 08/19/22 1438  AST 21  ALT 20  ALKPHOS 40  BILITOT 0.7  PROT 6.7  ALBUMIN 4.0   No results for input(s): "LIPASE", "AMYLASE" in the last 168 hours. No results for input(s): "AMMONIA" in the last 168 hours. CBC: Recent Labs  Lab 08/19/22 1438 08/20/22 0410 08/21/22 0546 08/23/22 0215 08/24/22 0345 08/25/22 0633  WBC 5.9 6.1 5.6 12.3* 11.7* 11.2*  NEUTROABS 5.1  --  4.5  --   --   --   HGB 11.6* 10.1* 9.8* 11.0* 10.4* 11.5*  HCT 35.0* 30.5* 30.4* 33.3* 32.0* 34.1*  MCV 100.9* 100.3* 102.4* 100.3* 101.3* 100.3*  PLT 112* 103* 106* 120* 118* 131*   Cardiac Enzymes: No results for input(s): "CKTOTAL", "CKMB", "CKMBINDEX", "TROPONINI" in the last 168 hours. BNP: BNP (last 3 results) No results for input(s): "BNP" in the last 8760 hours.  ProBNP (last 3 results) No results for input(s): "PROBNP" in the last 8760 hours.  CBG: No results for input(s): "GLUCAP" in the last 168 hours.   Signed:  Nita Sells MD   [facility] 08/25/2022, 12:54 PM

## 2022-08-25 NOTE — Progress Notes (Signed)
Inpatient Rehab Admissions Coordinator:   Note updated therapy recommendations.  Spoke to pt, spouse, and Dr. Verlon Au at bedside.  Will sign off for CIR at this time.   Shann Medal, PT, DPT Admissions Coordinator [phone] 08/25/22  12:15 PM

## 2022-08-25 NOTE — Progress Notes (Signed)
Occupational Therapy Treatment Patient Details Name: John Hinton MRN: [id number] DOB: Jun 18, 1946 Today's Date: 08/25/2022   History of present illness 77 y.o. male admitted 08/19/22 with new onset weakness, confusion, and ataxia. CT head, MRI and LP negative. Pt started on IV solumedrol. PMH:  stage 4 lung CA with mets to Bil adrenal glands and bone, PE on Eliquis, BPH, HTN, dilated cardiomyopathy.   OT comments  Patient demonstrated good progress this treatment session with patient ambulating in room without an assistive device and able to perform bathing in shower and dressing with supervision for safety. Discharge recommendations change from AIR to OPOT to further address balance, self care, and cognition. Acute OT to continue to follow.    Recommendations for follow up therapy are one component of a multi-disciplinary discharge planning process, led by the attending physician.  Recommendations may be updated based on patient status, additional functional criteria and insurance authorization.    Follow Up Recommendations  Outpatient OT     Assistance Recommended at Discharge Intermittent Supervision/Assistance  Patient can return home with the following  A little help with walking and/or transfers;A little help with bathing/dressing/bathroom;Assistance with cooking/housework;Direct supervision/assist for medications management;Direct supervision/assist for financial management;Assist for transportation;Help with stairs or ramp for entrance   Equipment Recommendations  Other (comment) (defer)    Recommendations for Other Services      Precautions / Restrictions Precautions Precautions: Fall       Mobility Bed Mobility Overal bed mobility: Needs Assistance Bed Mobility: Supine to Sit     Supine to sit: Supervision     General bed mobility comments: on EOB at end of session    Transfers Overall transfer level: Needs assistance Equipment used: None                General transfer comment: supervision for shower transfers     Balance Overall balance assessment: Needs assistance   Sitting balance-Leahy Scale: Good Sitting balance - Comments: static sitting without support     Standing balance-Leahy Scale: Good Standing balance comment: able to stand for shower with supervision for safety                           ADL either performed or assessed with clinical judgement   ADL Overall ADL's : Needs assistance/impaired     Grooming: Brushing hair;Supervision/safety;Standing Grooming Details (indicate cue type and reason): at sink Upper Body Bathing: Supervision/ safety;Standing Upper Body Bathing Details (indicate cue type and reason): in shower Lower Body Bathing: Supervison/ safety;Sit to/from stand Lower Body Bathing Details (indicate cue type and reason): in shower Upper Body Dressing : Set up;Sitting Upper Body Dressing Details (indicate cue type and reason): donn gown Lower Body Dressing: Supervision/safety;Sit to/from stand Lower Body Dressing Details (indicate cue type and reason): donned boxers and socks         Tub/ Shower Transfer: Walk-in shower;Supervision/safety;Grab bars Clinical cytogeneticist Details (indicate cue type and reason): supervision for safety Functional mobility during ADLs: Supervision/safety General ADL Comments: demonstrated good standing balance and tolerance with standing in shower    Extremity/Trunk Assessment              Vision       Perception     Praxis      Cognition Arousal/Alertness: Awake/alert Behavior During Therapy: WFL for tasks assessed/performed Overall Cognitive Status: Within Functional Limits for tasks assessed  General Comments: cues for safety in shower        Exercises      Shoulder Instructions       General Comments      Pertinent Vitals/ Pain       Pain Assessment Pain Assessment: No/denies  pain  Home Living                                          Prior Functioning/Environment              Frequency  Min 2X/week        Progress Toward Goals  OT Goals(current goals can now be found in the care plan section)  Progress towards OT goals: Progressing toward goals  Acute Rehab OT Goals Patient Stated Goal: go home OT Goal Formulation: With patient Time For Goal Achievement: 09/06/22 Potential to Achieve Goals: Good ADL Goals Pt Will Perform Grooming: with modified independence;standing Pt Will Perform Lower Body Dressing: with modified independence;sit to/from stand Pt Will Perform Tub/Shower Transfer: Tub transfer;with modified independence;ambulating Additional ADL Goal #1: Pt will perform 3 consecutive ADL without DOE. Additional ADL Goal #2: Pt will correctly place all items in pill box test without assistance.  Plan Discharge plan needs to be updated    Co-evaluation                 AM-PAC OT "6 Clicks" Daily Activity     Outcome Measure   Help from another person eating meals?: None Help from another person taking care of personal grooming?: A Little Help from another person toileting, which includes using toliet, bedpan, or urinal?: A Little Help from another person bathing (including washing, rinsing, drying)?: A Little Help from another person to put on and taking off regular upper body clothing?: A Little Help from another person to put on and taking off regular lower body clothing?: A Little 6 Click Score: 19    End of Session    OT Visit Diagnosis: Unsteadiness on feet (R26.81);Muscle weakness (generalized) (M62.81);Other abnormalities of gait and mobility (R26.89);Other symptoms and signs involving cognitive function;History of falling (Z91.81)   Activity Tolerance Patient tolerated treatment well   Patient Left in bed;with call bell/phone within reach (seated on EOB)   Nurse Communication Mobility status         Time: 2993-7169 OT Time Calculation (min): 26 min  Charges: OT General Charges $OT Visit: 1 Visit OT Treatments $Self Care/Home Management : 23-37 mins  Lodema Hong, [place]  Office [place] 08/25/2022, 1:06 PM

## 2022-08-25 NOTE — Progress Notes (Signed)
Mobility Specialist - Progress Note   08/25/22 0932  Mobility  Activity Ambulated with assistance in hallway  Level of Assistance Standby assist, set-up cues, supervision of patient - no hands on  Assistive Device Front wheel walker  Distance Ambulated (ft) 600 ft  Activity Response Tolerated well  Mobility Referral Yes  $Mobility charge 1 Mobility   Pt received in bed and agreeable to mobility. Pt with no complaints throughout session. Pt was left with PT to do stairs.   Franki Monte  Mobility Specialist Please contact via Solicitor or Rehab office at [phone]

## 2022-08-25 NOTE — Progress Notes (Signed)
Patient will be discharged home today. Dr Marin Olp would like to see patient in about 2 weeks for follow up. Patient was already scheduled for 09/08/2022, so we will keep that. Dr Marin Olp would also like patient to restart his [place] tomorrow. Confirmed via MyChart (see communication dated 08/12/2022)  Oncology Nurse Navigator Documentation     08/25/2022    2:00 PM  Oncology Nurse Navigator Flowsheets  Navigator Follow Up Date: 09/08/2022  Navigator Follow Up Reason: Follow-up Appointment  Navigator Location [facility]  Navigator Encounter Type Appt/Treatment Plan Review;MyChart  Patient Visit Type MedOnc  Treatment Phase Active Tx  Barriers/Navigation Needs Coordination of Care  Interventions Education  Acuity Level 2-Minimal Needs (1-2 Barriers Identified)  Education Method Written  Support Groups/Services Friends and Family  Time Spent with Patient 15

## 2022-08-25 NOTE — Plan of Care (Signed)
  Problem: Clinical Measurements: Goal: Will remain free from infection Outcome: Progressing   Problem: Nutrition: Goal: Adequate nutrition will be maintained Outcome: Progressing   Problem: Coping: Goal: Level of anxiety will decrease Outcome: Progressing   Problem: Pain Managment: Goal: General experience of comfort will improve Outcome: Progressing   Problem: Safety: Goal: Ability to remain free from injury will improve Outcome: Progressing

## 2022-08-25 NOTE — Progress Notes (Signed)
Physical Therapy Treatment Patient Details Name: John Hinton MRN: [id number] DOB: 1946-03-01 Today's Date: 08/25/2022   History of Present Illness 77 y.o. male admitted 08/19/22 with new onset weakness, confusion, and ataxia. CT head, MRI and LP negative. Pt started on IV solumedrol. PMH:  stage 4 lung CA with mets to Bil adrenal glands and bone, PE on Eliquis, BPH, HTN, dilated cardiomyopathy.    PT Comments    Pt pleasant with significant improvement in gait and balance. Pt able to perform gait without AD, perform stairs with guarding and tolerated balance challenges and repeated sit to stands all without LOB. PT would benefit from OPPT at D/C to maximize balance and independence with wife able to provide transportation and both pt and wife in agreement.     Recommendations for follow up therapy are one component of a multi-disciplinary discharge planning process, led by the attending physician.  Recommendations may be updated based on patient status, additional functional criteria and insurance authorization.  Follow Up Recommendations  Outpatient PT     Assistance Recommended at Discharge Intermittent Supervision/Assistance  Patient can return home with the following Assistance with cooking/housework;Assist for transportation;Help with stairs or ramp for entrance   Equipment Recommendations  None recommended by PT    Recommendations for Other Services       Precautions / Restrictions Precautions Precautions: Fall     Mobility  Bed Mobility               General bed mobility comments: standing on arrival, EOB end of session    Transfers Overall transfer level: Modified independent                 General transfer comment: pt able to perform 10 sit to stands without UE support in 30 sec    Ambulation/Gait Ambulation/Gait assistance: Supervision Gait Distance (Feet): 400 Feet Assistive device: None Gait Pattern/deviations: Step-through pattern,  Decreased stride length   Gait velocity interpretation: >2.62 ft/sec, indicative of community ambulatory   General Gait Details: pt with cautious gait without overt LOB and able to maintain balance without use of RW   Stairs Stairs: Yes Stairs assistance: Min guard Stair Management: Step to pattern, Forwards, No rails Number of Stairs: 5 General stair comments: pt able to complete stairs with and without hand on wall to steady   Wheelchair Mobility    Modified Rankin (Stroke Patients Only)       Balance Overall balance assessment: Needs assistance   Sitting balance-Leahy Scale: Good Sitting balance - Comments: static sitting without support     Standing balance-Leahy Scale: Good Standing balance comment: able to walk without physical assist Single Leg Stance - Right Leg: 5 Single Leg Stance - Left Leg: 10 Tandem Stance - Right Leg: 15 Tandem Stance - Left Leg: 15   [place] - Eyes Closed: 30   High Level Balance Comments: pt able to pick object off floor and turn 360 degrees without LOB            Cognition Arousal/Alertness: Awake/alert Behavior During Therapy: WFL for tasks assessed/performed Overall Cognitive Status: Within Functional Limits for tasks assessed                                          Exercises      General Comments        Pertinent Vitals/Pain Pain Assessment Pain Assessment: No/denies pain  Home Living                          Prior Function            PT Goals (current goals can now be found in the care plan section) Progress towards PT goals: Progressing toward goals    Frequency    Min 3X/week      PT Plan Discharge plan needs to be updated    Co-evaluation              AM-PAC PT "6 Clicks" Mobility   Outcome Measure  Help needed turning from your back to your side while in a flat bed without using bedrails?: None Help needed moving from lying on your back to sitting on  the side of a flat bed without using bedrails?: None Help needed moving to and from a bed to a chair (including a wheelchair)?: None Help needed standing up from a chair using your arms (e.g., wheelchair or bedside chair)?: None Help needed to walk in hospital room?: A Little Help needed climbing 3-5 steps with a railing? : A Little 6 Click Score: 22    End of Session Equipment Utilized During Treatment: Gait belt Activity Tolerance: Patient tolerated treatment well Patient left: in bed;with call bell/phone within reach;with family/visitor present Nurse Communication: Mobility status PT Visit Diagnosis: Unsteadiness on feet (R26.81)     Time: 4166-0630 PT Time Calculation (min) (ACUTE ONLY): 15 min  Charges:  $Therapeutic Activity: 8-22 mins                     Bayard Males, PT Acute Rehabilitation Services Office: Friant B Zyana Amaro 08/25/2022, 10:17 AM

## 2022-08-25 NOTE — TOC Transition Note (Signed)
Transition of Care [facility]) - CM/SW Discharge Note   Patient Details  Name: John Hinton MRN: [id number] Date of Birth: [birth year]  [facility] CM/SW Contact:  Curlene Labrum, RN Phone Number: 08/25/2022, 2:15 PM   Clinical Narrative:    CM met with the patient and wife at the bedside to discuss OP Therapy needs.  The wife states that the patient was recently active with [facility] - I called the location and they are unable to re-evaluate the patient until March.  I called the [place] location and they accepted the referral - referral placed via EPIC and they will follow up with the patient/wife to schedule.  Placed in the discharge instructions.  Attending MD to co-sign prior to discharge to home.   Final next level of care: OP Rehab Barriers to Discharge: No Barriers Identified   Patient Goals and CMS Choice CMS Medicare.gov Compare Post Acute Care list provided to:: Patient Choice offered to / list presented to : Patient  Discharge Placement                         Discharge Plan and Services Additional resources added to the After Visit Summary for     Discharge Planning Services: CM Consult                                 Social Determinants of Health (SDOH) Interventions SDOH Screenings   Food Insecurity: No Food Insecurity (08/20/2022)  Housing: Low Risk  (08/20/2022)  Transportation Needs: No Transportation Needs (08/20/2022)  Utilities: Not At Risk (08/20/2022)  Financial Resource Strain: Low Risk  (06/18/2022)  Stress: No Stress Concern Present (06/18/2022)  Tobacco Use: Medium Risk (08/19/2022)     Readmission Risk Interventions    08/25/2022    2:15 PM  Readmission Risk Prevention Plan  Transportation Screening Complete  PCP or Specialist Appt within 5-7 Days Complete  Home Care Screening Complete  Medication Review (RN CM) Complete

## 2022-08-26 ENCOUNTER — Other Ambulatory Visit: Payer: Self-pay | Admitting: *Deleted

## 2022-08-26 LAB — COPPER, SERUM: Copper: 100 ug/dL (ref 69–132)

## 2022-08-26 LAB — PROTEIN ELECTROPHORESIS, SERUM
A/G Ratio: 1.4 (ref 0.7–1.7)
Albumin ELP: 3.4 g/dL (ref 2.9–4.4)
Alpha-1-Globulin: 0.3 g/dL (ref 0.0–0.4)
Alpha-2-Globulin: 0.8 g/dL (ref 0.4–1.0)
Beta Globulin: 0.9 g/dL (ref 0.7–1.3)
Gamma Globulin: 0.5 g/dL (ref 0.4–1.8)
Globulin, Total: 2.4 g/dL (ref 2.2–3.9)
Total Protein ELP: 5.8 g/dL — ABNORMAL LOW (ref 6.0–8.5)

## 2022-08-26 LAB — CYTOLOGY - NON PAP

## 2022-08-26 LAB — ZINC: Zinc: 52 ug/dL (ref 44–115)

## 2022-08-26 LAB — OLIGOCLONAL BANDS, CSF + SERM

## 2022-08-26 MED ORDER — PREDNISONE 20 MG PO TABS: ORAL_TABLET | ORAL | 0 refills | Status: DC

## 2022-08-27 ENCOUNTER — Encounter: Payer: Self-pay | Admitting: Physical Therapy

## 2022-08-27 ENCOUNTER — Telehealth: Payer: Self-pay

## 2022-08-27 ENCOUNTER — Ambulatory Visit: Payer: Medicare Other | Attending: Family Medicine | Admitting: Physical Therapy

## 2022-08-27 ENCOUNTER — Other Ambulatory Visit: Payer: Self-pay

## 2022-08-27 ENCOUNTER — Other Ambulatory Visit (HOSPITAL_COMMUNITY): Payer: Self-pay

## 2022-08-27 DIAGNOSIS — C3490 Malignant neoplasm of unspecified part of unspecified bronchus or lung: Secondary | ICD-10-CM | POA: Diagnosis not present

## 2022-08-27 DIAGNOSIS — R262 Difficulty in walking, not elsewhere classified: Secondary | ICD-10-CM | POA: Diagnosis not present

## 2022-08-27 DIAGNOSIS — M6281 Muscle weakness (generalized): Secondary | ICD-10-CM | POA: Diagnosis not present

## 2022-08-27 NOTE — Therapy (Signed)
OUTPATIENT PHYSICAL THERAPY ONCOLOGY EVALUATION  Patient Name: John Hinton MRN: [id number] DOB:1945-08-01, 77 y.o., male Today's Date: 08/27/2022  END OF SESSION:  PT End of Session - 08/27/22 1453     Visit Number 1    Number of Visits 9    Date for PT Re-Evaluation 09/24/22    PT Start Time 1402    PT Stop Time 1452    PT Time Calculation (min) 50 min    Activity Tolerance Patient tolerated treatment well    Behavior During Therapy WFL for tasks assessed/performed             Past Medical History:  Diagnosis Date   BPH (benign prostatic hypertrophy)    Bradycardia    a. H/o asymptomatic bradycardia.   Coronary artery disease    a. s/p CABG ~2007 with LIMA to LAD, SVG seq to OM1 and OM2, SVG to PDA and SVG to diag. b. Abnormal nuc 03/2015 - s/p DES to SVG-OM1-OM2. 01/29/17 POBA with cutting balloon to SVG-->OM.  No ischemia Lexi myoview 2021   DCM (dilated cardiomyopathy) ([place]) 09/17/2015   mixed by cMRI 03/2021 with EF 39% with non viable infarct in basal to mid lateral wall and LGE pattern c/w nonischemic component as well. Mild RV dysfunction RVEF 42%.   Dyslipidemia    Dyspnea    r/t lung mass   ED (erectile dysfunction)    Fear of heights    Goals of care, counseling/discussion 06/19/2020   History of radiation therapy    T spine and left ilium 10/07/2021-10/20/2021  Dr Gery Pray   Hypertension    Non-small cell carcinoma of lung, stage 4, left ([place]) 06/19/2020   OSA (obstructive sleep apnea) 07/12/2015   Moderate OSA with AHI 16.5/hr now on CPAP at 8cm H2O, uses cpap nightly      RBBB    Shoulder, capsulitis, adhesive    Left Shoulder   Past Surgical History:  Procedure Laterality Date   APPENDECTOMY     BRONCHIAL BIOPSY  05/09/2020   Procedure: BRONCHIAL BIOPSIES;  Surgeon: [name], DO;  Location: [facility];  Service: Pulmonary;;   BRONCHIAL BRUSHINGS  05/09/2020   Procedure: BRONCHIAL BRUSHINGS;  Surgeon: [name], DO;  Location: [facility];  Service: Pulmonary;;   BRONCHIAL NEEDLE ASPIRATION BIOPSY  05/09/2020   Procedure: BRONCHIAL NEEDLE ASPIRATION BIOPSIES;  Surgeon: [name], DO;  Location: [facility];  Service: Pulmonary;;   BRONCHIAL WASHINGS  05/09/2020   Procedure: BRONCHIAL WASHINGS;  Surgeon: [name], DO;  Location: [facility];  Service: Pulmonary;;   CARDIAC CATHETERIZATION N/A 04/05/2015   Procedure: Left Heart Cath and Coronary Angiography;  Surgeon: Sherren Mocha, MD;  Location: [facility];  Service: Cardiovascular;  Laterality: N/A;   COLONOSCOPY     CORONARY ARTERY BYPASS GRAFT     w/LIMA to LAD, seq SVG to OM1 and OM2, SVG to PDA and SVG to Diagonal   CORONARY BALLOON ANGIOPLASTY N/A 01/29/2017   Procedure: Coronary Balloon Angioplasty;  Surgeon: Martinique, Peter M, MD;  Location: [facility];  Service: Cardiovascular;  Laterality: N/A;   FIDUCIAL MARKER PLACEMENT  05/09/2020   Procedure: FIDUCIAL MARKER PLACEMENT;  Surgeon: [name], DO;  Location: [facility];  Service: Pulmonary;;   INTERCOSTAL NERVE BLOCK Left 05/22/2020   Procedure: INTERCOSTAL NERVE BLOCK;  Surgeon: Lajuana Matte, MD;  Location: [facility];  Service: Thoracic;  Laterality: Left;   LEFT HEART CATH AND CORS/GRAFTS ANGIOGRAPHY N/A  01/29/2017   Procedure: Left Heart Cath and Cors/Grafts Angiography;  Surgeon: Martinique, Peter M, MD;  Location: [facility];  Service: Cardiovascular;  Laterality: N/A;   NODE DISSECTION N/A 05/22/2020   Procedure: NODE DISSECTION;  Surgeon: Lajuana Matte, MD;  Location: [facility];  Service: Thoracic;  Laterality: N/A;   VIDEO BRONCHOSCOPY WITH ENDOBRONCHIAL NAVIGATION N/A 05/09/2020   Procedure: VIDEO BRONCHOSCOPY WITH ENDOBRONCHIAL NAVIGATION;  Surgeon: [name], DO;  Location: [facility];  Service: Pulmonary;  Laterality: N/A;   VIDEO BRONCHOSCOPY WITH ENDOBRONCHIAL ULTRASOUND N/A 05/09/2020   Procedure: VIDEO BRONCHOSCOPY WITH ENDOBRONCHIAL  ULTRASOUND;  Surgeon: [name], DO;  Location: [facility];  Service: Pulmonary;  Laterality: N/A;   Patient Active Problem List   Diagnosis Date Noted   Ataxia 08/19/2022   Malignant neoplasm of lung ([place]) 05/29/2021   Acute pulmonary embolism ([place]) 05/04/2021   Non-small cell carcinoma of lung, stage 4, left ([place]) 06/19/2020   Goals of care, counseling/discussion 06/19/2020   S/P partial lobectomy of lung 05/22/2020   DCM (dilated cardiomyopathy) ([place]) 09/17/2015   OSA (obstructive sleep apnea) 07/12/2015   Bradycardia 08/29/2013   CAD (coronary artery disease), native coronary artery    RBBB    Essential hypertension, benign 08/18/2013   Hyperlipemia 08/18/2013    PCP: [name], MD  REFERRING PROVIDER: Nita Sells, MD   REFERRING DIAG: C34.90 (ICD-10-CM) - Malignant neoplasm of lung, unspecified laterality, unspecified part of lung ([place])   THERAPY DIAG:  Difficulty in walking, not elsewhere classified  Muscle weakness (generalized)  Malignant neoplasm of lung, unspecified laterality, unspecified part of lung ([place])  ONSET DATE: 03/20/22  Rationale for Evaluation and Treatment: Rehabilitation  SUBJECTIVE:                                                                                                                                                                                           SUBJECTIVE STATEMENT: I stopped going to therapy at [facility] because I was having to go and do so many tests related to the cancer. I have been having more exhaustion.   PERTINENT HISTORY:  Stage 4 lung cancer with mets to bilateral adrenal glands and bone - T1, T10 (s/p lobectomy 2021, chemo and radiation). 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 Hx of CABG 2007 and pulmonary embolism 2022, partial lobectomy of lung 05/22/2020, also BPH, HTN and dilated  cardiomyopathy  PAIN:  Are you having pain? No  PRECAUTIONS: Bony mets  WEIGHT BEARING RESTRICTIONS: No  FALLS:  Has patient fallen in last  6 months? 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  LEISURE: pt reports he has not been able to exercise   HAND DOMINANCE: right   PRIOR LEVEL OF FUNCTION: Independent  PATIENT GOALS: to be able to walk on his own on decrease caregiver burden   OBJECTIVE:  COGNITION: Overall cognitive status: Within functional limits for tasks assessed   OBSERVATIONS / OTHER ASSESSMENTS: some increased shortness of breath with exercise followed by good recovery after seated recovery period  POSTURE: forward head, rounded shoulders   UPPER EXTREMITY STRENGTH: 5/5   LOWER EXTREMITY MMT:  STRENGTH Right eval  Hip flexion 4/5  Hip extension 4/5  Hip abduction 4+/5  Hip adduction   Hip internal rotation   Hip external rotation   Knee flexion 3+/5  Knee extension 5/5  Ankle dorsiflexion 4+/5  Ankle plantarflexion   Ankle inversion   Ankle eversion    (Blank rows = not tested)  A/PROM LEFT eval  Hip flexion 4/5  Hip extension 4/5  Hip abduction 4/5  Hip adduction   Hip internal rotation   Hip external rotation   Knee flexion 3+/5  Knee extension 5/5  Ankle dorsiflexion 4+/5  Ankle plantarflexion   Ankle inversion   Ankle eversion    (Blank rows = not tested)  FUNCTIONAL TESTS:  30 seconds chair stand test 10 reps Vitals prior: 96% and 66 bpm, then post 95% and 89 bpm  GAIT: Distance walked: 200 feet  Assistive device utilized: None Level of assistance: SBA  Comments: decreased trunk rotation, comes very close to obstacles and veers to the side if he is not  looking straight ahead  BALANCE: L SLS - unable to maintain balance R SLS - 15 sec   TODAY'S TREATMENT:                                                                                                                                         DATE:  08/27/22- none today due to time constraints  PATIENT EDUCATION:  Education details: pacing self especially when feeling short of breath Person educated: Patient Education method: Explanation Education comprehension: verbalized understanding  HOME EXERCISE PROGRAM: None yet  ASSESSMENT:  CLINICAL IMPRESSION: Patient is a 77 y.o. male who was seen today for physical therapy evaluation and treatment for muscle weakness, decreased balance and difficulty walking. Pt has stage 4 lung cancer and has completed chemo and radiation and underwent a lobectomy. He has bony mets to his spine. He had a recent hospitalization for a sudden onset of ataxia that was possibly due to one of his medications. His ataxia is much better  but he still has some difficulty with walking and veers to the side he is looking towards. He has weakness in bilateral hamstrings. His wife reports he is having difficulty with steps at home. He would benefit from skilled PT services to improve bilateral LE strength, improve balance and improve independence and safety with gait and stair ambulation.   OBJECTIVE IMPAIRMENTS: Abnormal gait, decreased balance, difficulty walking, decreased strength, and postural dysfunction.   ACTIVITY LIMITATIONS: stairs and ambulation  PARTICIPATION LIMITATIONS: driving and community activity  PERSONAL FACTORS: Time since onset of injury/illness/exacerbation and 1 comorbidity: bony mets  are also affecting patient's functional outcome.   REHAB POTENTIAL: Good  CLINICAL DECISION MAKING: Stable/uncomplicated  EVALUATION COMPLEXITY: Low  GOALS: Goals reviewed with patient? Yes  SHORT TERM GOALS = LONG TERM GOALS: Target date: 09/24/22  Pt will  demonstrate 5/5 bilateral hamstring strength to decrease risk of falls.  Baseline:  Goal status: INITIAL  2.  Pt will be able to ambulate for 500 feet independently without veering to the side.  Baseline:  Goal status: INITIAL  3.  Pt will be able to perform SLS on L LE for 15 sec to decrease fall risk.  Baseline:  Goal status: INITIAL  4.  Pt will be independent in a home exercise program for continued stretching and strengthening.  Baseline:  Goal status: INITIAL  5.  Pt will be able to ascend/descend 5 steps with no handrail safely and independently to decrease fall risk at home.  Baseline:  Goal status: INITIAL  PLAN:  PT FREQUENCY: 2x/week  PT DURATION: 4 weeks  PLANNED INTERVENTIONS: Therapeutic exercises, Therapeutic activity, Neuromuscular re-education, Balance training, Gait training, Patient/Family education, Self Care, Joint mobilization, Manual therapy, and Re-evaluation  PLAN FOR NEXT SESSION: **bony mets** NuStep while monitoring HR/O2, stair ambulation, gait training, high level balance activities, hamstring strengthening   [employer], PT 08/27/2022, 3:11 PM

## 2022-08-27 NOTE — Telephone Encounter (Addendum)
Oral Oncology Patient Advocate Encounter  Prior Authorization for [name] has been approved.    PA# [id number]  Effective dates: 07/20/22 through 08/27/23  Patients co-pay is $3,002.57.    Berdine Addison, [facility] Patient [place]  [phone] (phone) [phone] (fax) 08/27/2022 8:29 AM

## 2022-08-27 NOTE — Telephone Encounter (Signed)
Oral Oncology Patient Advocate Encounter   Received notification via fax from patient's insurance carrier that prior authorization for [name] is required.   PA submitted on 08/27/22  Key [id number]  Status is pending     Berdine Addison, [facility] Patient [place]  [phone] (phone) [phone] (fax) 08/27/2022 8:26 AM

## 2022-08-27 NOTE — Telephone Encounter (Addendum)
Received notification that Letter of Hardship that was sent needed to have an itemized expense list attached. I contacted patient's spouse to let them know. They are putting together a medical expense list to send to me for submission to [employer]. I will continue to follow and update until final determination.   Berdine Addison, [facility] Patient [place]  [phone] (phone) [phone] (fax) 08/27/2022 9:53 AM

## 2022-08-28 ENCOUNTER — Other Ambulatory Visit: Payer: Self-pay

## 2022-08-28 ENCOUNTER — Other Ambulatory Visit: Payer: Self-pay | Admitting: Cardiology

## 2022-08-31 ENCOUNTER — Ambulatory Visit: Payer: Medicare Other

## 2022-08-31 DIAGNOSIS — M6281 Muscle weakness (generalized): Secondary | ICD-10-CM

## 2022-08-31 DIAGNOSIS — R262 Difficulty in walking, not elsewhere classified: Secondary | ICD-10-CM

## 2022-08-31 DIAGNOSIS — C3490 Malignant neoplasm of unspecified part of unspecified bronchus or lung: Secondary | ICD-10-CM

## 2022-08-31 NOTE — Therapy (Signed)
OUTPATIENT PHYSICAL THERAPY ONCOLOGY TREATMENT  Patient Name: John Hinton MRN: [id number] DOB:03/10/1946, 77 y.o., male Today's Date: 08/31/2022  END OF SESSION:  PT End of Session - 08/31/22 1201     Visit Number 2    Number of Visits 9    Date for PT Re-Evaluation 09/24/22    PT Start Time 1202    PT Stop Time 1255    PT Time Calculation (min) 53 min    Equipment Utilized During Treatment Gait belt    Activity Tolerance Patient tolerated treatment well    Behavior During Therapy WFL for tasks assessed/performed             Past Medical History:  Diagnosis Date   BPH (benign prostatic hypertrophy)    Bradycardia    a. H/o asymptomatic bradycardia.   Coronary artery disease    a. s/p CABG ~2007 with LIMA to LAD, SVG seq to OM1 and OM2, SVG to PDA and SVG to diag. b. Abnormal nuc 03/2015 - s/p DES to SVG-OM1-OM2. 01/29/17 POBA with cutting balloon to SVG-->OM.  No ischemia Lexi myoview 2021   DCM (dilated cardiomyopathy) ([place]) 09/17/2015   mixed by cMRI 03/2021 with EF 39% with non viable infarct in basal to mid lateral wall and LGE pattern c/w nonischemic component as well. Mild RV dysfunction RVEF 42%.   Dyslipidemia    Dyspnea    r/t lung mass   ED (erectile dysfunction)    Fear of heights    Goals of care, counseling/discussion 06/19/2020   History of radiation therapy    T spine and left ilium 10/07/2021-10/20/2021  Dr Gery Pray   Hypertension    Non-small cell carcinoma of lung, stage 4, left ([place]) 06/19/2020   OSA (obstructive sleep apnea) 07/12/2015   Moderate OSA with AHI 16.5/hr now on CPAP at 8cm H2O, uses cpap nightly      RBBB    Shoulder, capsulitis, adhesive    Left Shoulder   Past Surgical History:  Procedure Laterality Date   APPENDECTOMY     BRONCHIAL BIOPSY  05/09/2020   Procedure: BRONCHIAL BIOPSIES;  Surgeon: [name], DO;  Location: [facility];  Service: Pulmonary;;   BRONCHIAL BRUSHINGS  05/09/2020   Procedure: BRONCHIAL  BRUSHINGS;  Surgeon: [name], DO;  Location: [facility];  Service: Pulmonary;;   BRONCHIAL NEEDLE ASPIRATION BIOPSY  05/09/2020   Procedure: BRONCHIAL NEEDLE ASPIRATION BIOPSIES;  Surgeon: [name], DO;  Location: [facility];  Service: Pulmonary;;   BRONCHIAL WASHINGS  05/09/2020   Procedure: BRONCHIAL WASHINGS;  Surgeon: [name], DO;  Location: [facility];  Service: Pulmonary;;   CARDIAC CATHETERIZATION N/A 04/05/2015   Procedure: Left Heart Cath and Coronary Angiography;  Surgeon: Sherren Mocha, MD;  Location: [facility];  Service: Cardiovascular;  Laterality: N/A;   COLONOSCOPY     CORONARY ARTERY BYPASS GRAFT     w/LIMA to LAD, seq SVG to OM1 and OM2, SVG to PDA and SVG to Diagonal   CORONARY BALLOON ANGIOPLASTY N/A 01/29/2017   Procedure: Coronary Balloon Angioplasty;  Surgeon: Martinique, Peter M, MD;  Location: [facility];  Service: Cardiovascular;  Laterality: N/A;   FIDUCIAL MARKER PLACEMENT  05/09/2020   Procedure: FIDUCIAL MARKER PLACEMENT;  Surgeon: [name], DO;  Location: [facility];  Service: Pulmonary;;   INTERCOSTAL NERVE BLOCK Left 05/22/2020   Procedure: INTERCOSTAL NERVE BLOCK;  Surgeon: Lajuana Matte, MD;  Location: [facility];  Service: Thoracic;  Laterality: Left;  LEFT HEART CATH AND CORS/GRAFTS ANGIOGRAPHY N/A 01/29/2017   Procedure: Left Heart Cath and Cors/Grafts Angiography;  Surgeon: Martinique, Peter M, MD;  Location: [facility];  Service: Cardiovascular;  Laterality: N/A;   NODE DISSECTION N/A 05/22/2020   Procedure: NODE DISSECTION;  Surgeon: Lajuana Matte, MD;  Location: [facility];  Service: Thoracic;  Laterality: N/A;   VIDEO BRONCHOSCOPY WITH ENDOBRONCHIAL NAVIGATION N/A 05/09/2020   Procedure: VIDEO BRONCHOSCOPY WITH ENDOBRONCHIAL NAVIGATION;  Surgeon: [name], DO;  Location: [facility];  Service: Pulmonary;  Laterality: N/A;   VIDEO BRONCHOSCOPY WITH ENDOBRONCHIAL ULTRASOUND N/A  05/09/2020   Procedure: VIDEO BRONCHOSCOPY WITH ENDOBRONCHIAL ULTRASOUND;  Surgeon: [name], DO;  Location: [facility];  Service: Pulmonary;  Laterality: N/A;   Patient Active Problem List   Diagnosis Date Noted   Ataxia 08/19/2022   Malignant neoplasm of lung ([place]) 05/29/2021   Acute pulmonary embolism ([place]) 05/04/2021   Non-small cell carcinoma of lung, stage 4, left ([place]) 06/19/2020   Goals of care, counseling/discussion 06/19/2020   S/P partial lobectomy of lung 05/22/2020   DCM (dilated cardiomyopathy) ([place]) 09/17/2015   OSA (obstructive sleep apnea) 07/12/2015   Bradycardia 08/29/2013   CAD (coronary artery disease), native coronary artery    RBBB    Essential hypertension, benign 08/18/2013   Hyperlipemia 08/18/2013    PCP: [name], MD  REFERRING PROVIDER: Nita Sells, MD   REFERRING DIAG: C34.90 (ICD-10-CM) - Malignant neoplasm of lung, unspecified laterality, unspecified part of lung ([place])   THERAPY DIAG:  Difficulty in walking, not elsewhere classified  Muscle weakness (generalized)  Malignant neoplasm of lung, unspecified laterality, unspecified part of lung ([place])  ONSET DATE: 03/20/22  Rationale for Evaluation and Treatment: Rehabilitation  SUBJECTIVE:                                                                                                                                                                                           SUBJECTIVE STATEMENT: Did OK after last visit.   I get tired with exertion. Woke up feeling a little foggy, but better right now.   PERTINENT HISTORY:  Stage 4 lung cancer with mets to bilateral adrenal glands and bone - T1, T10 (s/p lobectomy 2021, chemo and radiation). 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 Hx of CABG 2007 and pulmonary embolism 2022, partial lobectomy of lung 05/22/2020, also  BPH, HTN and dilated cardiomyopathy  PAIN:  Are you having pain? No  PRECAUTIONS: Bony mets  WEIGHT BEARING RESTRICTIONS: No  FALLS:  Has patient fallen in  last 6 months? 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  LEISURE: pt reports he has not been able to exercise   HAND DOMINANCE: right   PRIOR LEVEL OF FUNCTION: Independent  PATIENT GOALS: to be able to walk on his own on decrease caregiver burden   OBJECTIVE:  COGNITION: Overall cognitive status: Within functional limits for tasks assessed   OBSERVATIONS / OTHER ASSESSMENTS: some increased shortness of breath with exercise followed by good recovery after seated recovery period  POSTURE: forward head, rounded shoulders   UPPER EXTREMITY STRENGTH: 5/5   LOWER EXTREMITY MMT:  STRENGTH Right eval  Hip flexion 4/5  Hip extension 4/5  Hip abduction 4+/5  Hip adduction   Hip internal rotation   Hip external rotation   Knee flexion 3+/5  Knee extension 5/5  Ankle dorsiflexion 4+/5  Ankle plantarflexion   Ankle inversion   Ankle eversion    (Blank rows = not tested)  A/PROM LEFT eval  Hip flexion 4/5  Hip extension 4/5  Hip abduction 4/5  Hip adduction   Hip internal rotation   Hip external rotation   Knee flexion 3+/5  Knee extension 5/5  Ankle dorsiflexion 4+/5  Ankle plantarflexion   Ankle inversion   Ankle eversion    (Blank rows = not tested)  FUNCTIONAL TESTS:  30 seconds chair stand test 10 reps Vitals prior: 96% and 66 bpm, then post 95% and 89 bpm  GAIT: Distance walked: 200 feet  Assistive device utilized: None Level of assistance: SBA  Comments: decreased trunk rotation, comes very close to obstacles and veers to the  side if he is not looking straight ahead  BALANCE: L SLS - unable to maintain balance R SLS - 15 sec   TODAY'S TREATMENT:                                                                                                                                         DATE:   08/31/2022 98, HR 65 Nu step level 2, seat 10, UE 9 x 3 min 93%, 75 BPM recovered quickly x 2 more min 97, 71 BPM Marching 2 x 10 no HH, heel raises 2 x 10 Sidestepping 4 lengths of bar 96%, 75 BPM Standing with HH,SLR flexion, abd ext x 10, 93%, 72 Sit to stand 2 x 5 6 in step ups 1 x 10 B no HH, but PT CG, O2 93%, HR 85 %, recovered quickly  HR 79 Tandem stance bilaterally, held for count of 30 sec SLS x 3 B 4-8 sec, practiced how to wt. Shift properly to unwt the opposite side. Ambulation  485 feet, 02 90%, HR   08/27/22- none today due to time constraints  PATIENT EDUCATION:  Education details: pacing self especially when feeling short of breath Person educated: Patient Education method: Explanation Education comprehension: verbalized understanding  HOME EXERCISE PROGRAM: None yet  ASSESSMENT:  CLINICAL IMPRESSION: Pt did well with exercises instructed in therapy. Monitored O2 sats and HR Despite SOB with activity his O2 sats never went below 90, and they recovered quickly.  The lowest they went was with 485 foot walk .Marland Kitchen He was not noted to veer very often with ambulation today. He fatigued most with the walk and step ups/sit to stand. He felt good after exercising but felt good fatigue.   OBJECTIVE IMPAIRMENTS: Abnormal gait, decreased balance, difficulty walking, decreased strength, and postural dysfunction.   ACTIVITY LIMITATIONS: stairs and ambulation  PARTICIPATION LIMITATIONS: driving and community activity  PERSONAL FACTORS: Time since onset of injury/illness/exacerbation and 1 comorbidity: bony mets  are also affecting patient's functional outcome.   REHAB POTENTIAL: Good  CLINICAL DECISION MAKING:  Stable/uncomplicated  EVALUATION COMPLEXITY: Low  GOALS: Goals reviewed with patient? Yes  SHORT TERM GOALS = LONG TERM GOALS: Target date: 09/24/22  Pt will demonstrate 5/5 bilateral hamstring strength to decrease risk of falls.  Baseline:  Goal status: INITIAL  2.  Pt will be able to ambulate for 500 feet independently without veering to the side.  Baseline:  Goal status: INITIAL  3.  Pt will be able to perform SLS on L LE for 15 sec to decrease fall risk.  Baseline:  Goal status: INITIAL  4.  Pt will be independent in a home exercise program for continued stretching and strengthening.  Baseline:  Goal status: INITIAL  5.  Pt will be able to ascend/descend 5 steps with no handrail safely and independently to decrease fall risk at home.  Baseline:  Goal status: INITIAL  PLAN:  PT FREQUENCY: 2x/week  PT DURATION: 4 weeks  PLANNED INTERVENTIONS: Therapeutic exercises, Therapeutic activity, Neuromuscular re-education, Balance training, Gait training, Patient/Family education, Self Care, Joint mobilization, Manual therapy, and Re-evaluation  PLAN FOR NEXT SESSION: **bony mets** NuStep while monitoring HR/O2, stair ambulation, gait training, high level balance activities, hamstring strengthening   Claris Pong, PT 08/31/2022, 1:00 PM

## 2022-09-01 DIAGNOSIS — C3412 Malignant neoplasm of upper lobe, left bronchus or lung: Secondary | ICD-10-CM | POA: Diagnosis not present

## 2022-09-01 DIAGNOSIS — I10 Essential (primary) hypertension: Secondary | ICD-10-CM | POA: Diagnosis not present

## 2022-09-01 DIAGNOSIS — C3492 Malignant neoplasm of unspecified part of left bronchus or lung: Secondary | ICD-10-CM | POA: Diagnosis not present

## 2022-09-01 DIAGNOSIS — R270 Ataxia, unspecified: Secondary | ICD-10-CM | POA: Diagnosis not present

## 2022-09-01 DIAGNOSIS — Z86711 Personal history of pulmonary embolism: Secondary | ICD-10-CM | POA: Diagnosis not present

## 2022-09-03 ENCOUNTER — Encounter: Payer: Self-pay | Admitting: Hematology & Oncology

## 2022-09-03 ENCOUNTER — Encounter: Payer: Self-pay | Admitting: Physical Therapy

## 2022-09-03 ENCOUNTER — Ambulatory Visit: Payer: Medicare Other | Admitting: Physical Therapy

## 2022-09-03 DIAGNOSIS — M6281 Muscle weakness (generalized): Secondary | ICD-10-CM

## 2022-09-03 DIAGNOSIS — C3490 Malignant neoplasm of unspecified part of unspecified bronchus or lung: Secondary | ICD-10-CM | POA: Diagnosis not present

## 2022-09-03 DIAGNOSIS — R262 Difficulty in walking, not elsewhere classified: Secondary | ICD-10-CM

## 2022-09-03 NOTE — Telephone Encounter (Signed)
Received Itemized Letter of Hardship from patient and faxed to [employer] at [phone] for review. I will continue to follow and update until final determination.   Berdine Addison, [facility] Patient [place]  [phone] (phone) [phone] (fax) 09/03/2022 12:06 PM

## 2022-09-03 NOTE — Therapy (Signed)
OUTPATIENT PHYSICAL THERAPY ONCOLOGY TREATMENT  Patient Name: John Hinton MRN: [id number] DOB:May 18, 1946, 77 y.o., male Today's Date: 09/03/2022  END OF SESSION:    Past Medical History:  Diagnosis Date   BPH (benign prostatic hypertrophy)    Bradycardia    a. H/o asymptomatic bradycardia.   Coronary artery disease    a. s/p CABG ~2007 with LIMA to LAD, SVG seq to OM1 and OM2, SVG to PDA and SVG to diag. b. Abnormal nuc 03/2015 - s/p DES to SVG-OM1-OM2. 01/29/17 POBA with cutting balloon to SVG-->OM.  No ischemia Lexi myoview 2021   DCM (dilated cardiomyopathy) ([place]) 09/17/2015   mixed by cMRI 03/2021 with EF 39% with non viable infarct in basal to mid lateral wall and LGE pattern c/w nonischemic component as well. Mild RV dysfunction RVEF 42%.   Dyslipidemia    Dyspnea    r/t lung mass   ED (erectile dysfunction)    Fear of heights    Goals of care, counseling/discussion 06/19/2020   History of radiation therapy    T spine and left ilium 10/07/2021-10/20/2021  Dr Gery Pray   Hypertension    Non-small cell carcinoma of lung, stage 4, left ([place]) 06/19/2020   OSA (obstructive sleep apnea) 07/12/2015   Moderate OSA with AHI 16.5/hr now on CPAP at 8cm H2O, uses cpap nightly      RBBB    Shoulder, capsulitis, adhesive    Left Shoulder   Past Surgical History:  Procedure Laterality Date   APPENDECTOMY     BRONCHIAL BIOPSY  05/09/2020   Procedure: BRONCHIAL BIOPSIES;  Surgeon: [name], DO;  Location: [facility];  Service: Pulmonary;;   BRONCHIAL BRUSHINGS  05/09/2020   Procedure: BRONCHIAL BRUSHINGS;  Surgeon: [name], DO;  Location: [facility];  Service: Pulmonary;;   BRONCHIAL NEEDLE ASPIRATION BIOPSY  05/09/2020   Procedure: BRONCHIAL NEEDLE ASPIRATION BIOPSIES;  Surgeon: [name], DO;  Location: [facility];  Service: Pulmonary;;   BRONCHIAL WASHINGS  05/09/2020   Procedure: BRONCHIAL WASHINGS;  Surgeon: [name], DO;  Location: [facility];  Service: Pulmonary;;   CARDIAC CATHETERIZATION N/A 04/05/2015   Procedure: Left Heart Cath and Coronary Angiography;  Surgeon: Sherren Mocha, MD;  Location: [facility];  Service: Cardiovascular;  Laterality: N/A;   COLONOSCOPY     CORONARY ARTERY BYPASS GRAFT     w/LIMA to LAD, seq SVG to OM1 and OM2, SVG to PDA and SVG to Diagonal   CORONARY BALLOON ANGIOPLASTY N/A 01/29/2017   Procedure: Coronary Balloon Angioplasty;  Surgeon: Martinique, Peter M, MD;  Location: [facility];  Service: Cardiovascular;  Laterality: N/A;   FIDUCIAL MARKER PLACEMENT  05/09/2020   Procedure: FIDUCIAL MARKER PLACEMENT;  Surgeon: [name], DO;  Location: [facility];  Service: Pulmonary;;   INTERCOSTAL NERVE BLOCK Left 05/22/2020   Procedure: INTERCOSTAL NERVE BLOCK;  Surgeon: Lajuana Matte, MD;  Location: [facility];  Service: Thoracic;  Laterality: Left;   LEFT HEART CATH AND CORS/GRAFTS ANGIOGRAPHY N/A 01/29/2017   Procedure: Left Heart Cath and Cors/Grafts Angiography;  Surgeon: Martinique, Peter M, MD;  Location: [facility];  Service: Cardiovascular;  Laterality: N/A;   NODE DISSECTION N/A 05/22/2020   Procedure: NODE DISSECTION;  Surgeon: Lajuana Matte, MD;  Location: [facility];  Service: Thoracic;  Laterality: N/A;   VIDEO BRONCHOSCOPY WITH ENDOBRONCHIAL NAVIGATION N/A 05/09/2020   Procedure: VIDEO BRONCHOSCOPY WITH ENDOBRONCHIAL NAVIGATION;  Surgeon: [name], DO;  Location: [facility];  Service: Pulmonary;  Laterality: N/A;   VIDEO BRONCHOSCOPY WITH ENDOBRONCHIAL ULTRASOUND N/A 05/09/2020   Procedure: VIDEO BRONCHOSCOPY WITH ENDOBRONCHIAL ULTRASOUND;  Surgeon: [name], DO;  Location: [facility];  Service: Pulmonary;  Laterality: N/A;   Patient Active Problem List   Diagnosis Date Noted   Ataxia 08/19/2022   Malignant neoplasm of lung ([place]) 05/29/2021   Acute pulmonary embolism ([place]) 05/04/2021   Non-small cell carcinoma of lung, stage 4, left  ([place]) 06/19/2020   Goals of care, counseling/discussion 06/19/2020   S/P partial lobectomy of lung 05/22/2020   DCM (dilated cardiomyopathy) ([place]) 09/17/2015   OSA (obstructive sleep apnea) 07/12/2015   Bradycardia 08/29/2013   CAD (coronary artery disease), native coronary artery    RBBB    Essential hypertension, benign 08/18/2013   Hyperlipemia 08/18/2013    PCP: [name], MD  REFERRING PROVIDER: Nita Sells, MD   REFERRING DIAG: C34.90 (ICD-10-CM) - Malignant neoplasm of lung, unspecified laterality, unspecified part of lung ([place])   THERAPY DIAG:  No diagnosis found.  ONSET DATE: 03/20/22  Rationale for Evaluation and Treatment: Rehabilitation  SUBJECTIVE:                                                                                                                                                                                           SUBJECTIVE STATEMENT: I felt a little winded after the last visit but not bad. I did not have any muscle soreness the next day.   PERTINENT HISTORY:  Stage 4 lung cancer with mets to bilateral adrenal glands and bone - T1, T10 (s/p lobectomy 2021, chemo and radiation). 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 Hx of CABG 2007 and pulmonary embolism 2022, partial lobectomy of lung 05/22/2020, also BPH, HTN and dilated cardiomyopathy  PAIN:  Are you having pain? No  PRECAUTIONS: Bony mets  WEIGHT BEARING RESTRICTIONS: No  FALLS:  Has patient fallen in last 6 months? 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  LEISURE: pt reports he has not been able to  exercise   HAND DOMINANCE: right   PRIOR LEVEL OF FUNCTION: Independent  PATIENT GOALS: to be able to walk on his own on decrease caregiver burden   OBJECTIVE:  COGNITION: Overall cognitive status: Within functional limits  for tasks assessed   OBSERVATIONS / OTHER ASSESSMENTS: some increased shortness of breath with exercise followed by good recovery after seated recovery period  POSTURE: forward head, rounded shoulders   UPPER EXTREMITY STRENGTH: 5/5   LOWER EXTREMITY MMT:  STRENGTH Right eval  Hip flexion 4/5  Hip extension 4/5  Hip abduction 4+/5  Hip adduction   Hip internal rotation   Hip external rotation   Knee flexion 3+/5  Knee extension 5/5  Ankle dorsiflexion 4+/5  Ankle plantarflexion   Ankle inversion   Ankle eversion    (Blank rows = not tested)  A/PROM LEFT eval  Hip flexion 4/5  Hip extension 4/5  Hip abduction 4/5  Hip adduction   Hip internal rotation   Hip external rotation   Knee flexion 3+/5  Knee extension 5/5  Ankle dorsiflexion 4+/5  Ankle plantarflexion   Ankle inversion   Ankle eversion    (Blank rows = not tested)  FUNCTIONAL TESTS:  30 seconds chair stand test 10 reps Vitals prior: 96% and 66 bpm, then post 95% and 89 bpm  GAIT: Distance walked: 200 feet  Assistive device utilized: None Level of assistance: SBA  Comments: decreased trunk rotation, comes very close to obstacles and veers to the side if he is not looking straight ahead  BALANCE: L SLS - unable to maintain balance R SLS - 15 sec   TODAY'S TREATMENT:                                                                                                                                         DATE:   09/03/2022 98, HR 65 Nu step level 2, seat 10, UE 9 x 3 min 96%, 85 BPM at 3 min then x 3 more min 94, 83 BPM recovered to 97% and 73bpm Marching 2 x 10 no HH 94%O2 after first set then recovery period and then another set, heel raises 2 x 10 Sidestepping 4  lengths of bar 96%, 75 BPM Standing SLR flexion with no HHA, abd with finger touch assist, ext with only finger tip touching bar x 10, 95%, 92bpm Sit to stand 2 x 5 6 in step ups 10 reps no HH,, O2 94%, HR 97 recovered quickly Tandem stance bilaterally, held for count of 30 sec with occasional finger tip touch for balance SLS x 3 B 4-8 sec, practiced how to wt. Shift properly to unwt the opposite side. Ambulation 600 feet, 02 90%, HR 90 but quickly to 95% and 72bpm after 2 min  08/31/2022 98, HR 65 Nu step level 2, seat 10, UE 9 x 3 min 93%, 75 BPM recovered quickly x 2 more min 97, 71 BPM Marching 2 x 10 no HH, heel raises 2 x 10 Sidestepping 4 lengths of bar 96%, 75 BPM Standing with HH,SLR flexion, abd ext x 10, 93%, 72 Sit to stand 2 x 5 6  in step ups 1 x 10 B no HH,, O2 94%, HR 97 recovered quickly  HR 79 Tandem stance bilaterally, held for count of 30 sec SLS x 3 B 4-8 sec, practiced how to wt. Shift properly to unwt the opposite side. Ambulation 485 feet, 02 90%, HR   08/27/22- none today due to time constraints  PATIENT EDUCATION:  Education details: pacing self especially when feeling short of breath Person educated: Patient Education method: Explanation Education comprehension: verbalized understanding  HOME EXERCISE PROGRAM: Access Code: [email or web address] URL: [email or web address] Date: 09/03/2022 Prepared by: Manus Gunning  Exercises - Sit to Stand Without Arm Support  - 1 x daily - 7 x weekly - 2 sets - 5 reps - Standing March with Counter Support  - 1 x daily - 7 x weekly - 1 sets - 10 reps - Standing Hip Abduction with Counter Support  - 1 x daily - 7 x weekly - 1 sets - 10 reps - Standing Hip Extension with Counter Support  - 1 x daily - 7 x weekly - 1 sets - 10 reps - Heel Raises with Counter Support  - 1 x daily - 7 x weekly - 1-2 sets - 10 reps - Standing Tandem Balance with Counter Support  - 1 x daily - 7 x weekly - 1 sets - 10  reps  ASSESSMENT:  CLINICAL IMPRESSION: Pt did much better in therapy today. His O2 sats remained higher today throughout therapy. They dropped to 90 after 600 ft of ambulation but came back up after 2 min of a seated recovery. Created a home exercise program for pt to do at home.   OBJECTIVE IMPAIRMENTS: Abnormal gait, decreased balance, difficulty walking, decreased strength, and postural dysfunction.   ACTIVITY LIMITATIONS: stairs and ambulation  PARTICIPATION LIMITATIONS: driving and community activity  PERSONAL FACTORS: Time since onset of injury/illness/exacerbation and 1 comorbidity: bony mets  are also affecting patient's functional outcome.   REHAB POTENTIAL: Good  CLINICAL DECISION MAKING: Stable/uncomplicated  EVALUATION COMPLEXITY: Low  GOALS: Goals reviewed with patient? Yes  SHORT TERM GOALS = LONG TERM GOALS: Target date: 09/24/22  Pt will demonstrate 5/5 bilateral hamstring strength to decrease risk of falls.  Baseline:  Goal status: INITIAL  2.  Pt will be able to ambulate for 500 feet independently without veering to the side.  Baseline:  Goal status: INITIAL  3.  Pt will be able to perform SLS on L LE for 15 sec to decrease fall risk.  Baseline:  Goal status: INITIAL  4.  Pt will be independent in a home exercise program for continued stretching and strengthening.  Baseline:  Goal status: INITIAL  5.  Pt will be able to ascend/descend 5 steps with no handrail safely and independently to decrease fall risk at home.  Baseline:  Goal status: INITIAL  PLAN:  PT FREQUENCY: 2x/week  PT DURATION: 4 weeks  PLANNED INTERVENTIONS: Therapeutic exercises, Therapeutic activity, Neuromuscular re-education, Balance training, Gait training, Patient/Family education, Self Care, Joint mobilization, Manual therapy, and Re-evaluation  PLAN FOR NEXT SESSION: **bony mets** NuStep while monitoring HR/O2, stair ambulation, gait training, high level balance activities,  hamstring strengthening   [employer], PT 09/03/2022, 3:04 PM

## 2022-09-07 ENCOUNTER — Ambulatory Visit: Payer: Medicare Other | Admitting: Physical Therapy

## 2022-09-07 ENCOUNTER — Encounter: Payer: Self-pay | Admitting: Physical Therapy

## 2022-09-07 ENCOUNTER — Encounter: Payer: Self-pay | Admitting: Hematology & Oncology

## 2022-09-07 DIAGNOSIS — C3490 Malignant neoplasm of unspecified part of unspecified bronchus or lung: Secondary | ICD-10-CM | POA: Diagnosis not present

## 2022-09-07 DIAGNOSIS — M6281 Muscle weakness (generalized): Secondary | ICD-10-CM | POA: Diagnosis not present

## 2022-09-07 DIAGNOSIS — R262 Difficulty in walking, not elsewhere classified: Secondary | ICD-10-CM | POA: Diagnosis not present

## 2022-09-07 NOTE — Telephone Encounter (Signed)
Oral Oncology Patient Advocate Encounter   Received notification that the application for assistance for Krizati through [employer] has been denied due to 2024 Medicare Law changes. Patient's OOP max on Part D drugs is $3250 which patient will meet after first fill according to PAP Benefits Investigation. After which patient will have $0 responsibility for remainder of year.   [employer]'s phone number [phone].   I have spoken to the patient and they know to call me if co-pay is going to be more than what was told via representative is incorrect. Patient has also been place on Wait List for grants.   Berdine Addison, [facility] Patient [place]  [phone] (phone) [phone] (fax) 09/07/2022 8:49 AM

## 2022-09-07 NOTE — Therapy (Signed)
OUTPATIENT PHYSICAL THERAPY ONCOLOGY TREATMENT  Patient Name: John Hinton MRN: [id number] DOB:[birth year], 77 y.o., male Today's Date: 09/07/2022  END OF SESSION:  PT End of Session - 09/07/22 1405     Visit Number 4    Number of Visits 9    Date for PT Re-Evaluation 09/24/22    PT Start Time 2423    PT Stop Time 1456    PT Time Calculation (min) 53 min    Activity Tolerance Patient tolerated treatment well    Behavior During Therapy WFL for tasks assessed/performed              Past Medical History:  Diagnosis Date   BPH (benign prostatic hypertrophy)    Bradycardia    a. H/o asymptomatic bradycardia.   Coronary artery disease    a. s/p CABG ~2007 with LIMA to LAD, SVG seq to OM1 and OM2, SVG to PDA and SVG to diag. b. Abnormal nuc 03/2015 - s/p DES to SVG-OM1-OM2. 01/29/17 POBA with cutting balloon to SVG-->OM.  No ischemia Lexi myoview 2021   DCM (dilated cardiomyopathy) ([place]) 09/17/2015   mixed by cMRI 03/2021 with EF 39% with non viable infarct in basal to mid lateral wall and LGE pattern c/w nonischemic component as well. Mild RV dysfunction RVEF 42%.   Dyslipidemia    Dyspnea    r/t lung mass   ED (erectile dysfunction)    Fear of heights    Goals of care, counseling/discussion 06/19/2020   History of radiation therapy    T spine and left ilium 10/07/2021-10/20/2021  Dr Gery Pray   Hypertension    Non-small cell carcinoma of lung, stage 4, left ([place]) 06/19/2020   OSA (obstructive sleep apnea) 07/12/2015   Moderate OSA with AHI 16.5/hr now on CPAP at 8cm H2O, uses cpap nightly      RBBB    Shoulder, capsulitis, adhesive    Left Shoulder   Past Surgical History:  Procedure Laterality Date   APPENDECTOMY     BRONCHIAL BIOPSY  05/09/2020   Procedure: BRONCHIAL BIOPSIES;  Surgeon: [name], DO;  Location: [facility];  Service: Pulmonary;;   BRONCHIAL BRUSHINGS  05/09/2020   Procedure: BRONCHIAL BRUSHINGS;  Surgeon: [name], DO;  Location:  [facility];  Service: Pulmonary;;   BRONCHIAL NEEDLE ASPIRATION BIOPSY  05/09/2020   Procedure: BRONCHIAL NEEDLE ASPIRATION BIOPSIES;  Surgeon: [name], DO;  Location: [facility];  Service: Pulmonary;;   BRONCHIAL WASHINGS  05/09/2020   Procedure: BRONCHIAL WASHINGS;  Surgeon: [name], DO;  Location: [facility];  Service: Pulmonary;;   CARDIAC CATHETERIZATION N/A 04/05/2015   Procedure: Left Heart Cath and Coronary Angiography;  Surgeon: Sherren Mocha, MD;  Location: [facility];  Service: Cardiovascular;  Laterality: N/A;   COLONOSCOPY     CORONARY ARTERY BYPASS GRAFT     w/LIMA to LAD, seq SVG to OM1 and OM2, SVG to PDA and SVG to Diagonal   CORONARY BALLOON ANGIOPLASTY N/A 01/29/2017   Procedure: Coronary Balloon Angioplasty;  Surgeon: Martinique, Peter M, MD;  Location: [facility];  Service: Cardiovascular;  Laterality: N/A;   FIDUCIAL MARKER PLACEMENT  05/09/2020   Procedure: FIDUCIAL MARKER PLACEMENT;  Surgeon: [name], DO;  Location: [facility];  Service: Pulmonary;;   INTERCOSTAL NERVE BLOCK Left 05/22/2020   Procedure: INTERCOSTAL NERVE BLOCK;  Surgeon: Lajuana Matte, MD;  Location: [facility];  Service: Thoracic;  Laterality: Left;   LEFT HEART CATH AND CORS/GRAFTS ANGIOGRAPHY  N/A 01/29/2017   Procedure: Left Heart Cath and Cors/Grafts Angiography;  Surgeon: Martinique, Peter M, MD;  Location: [facility];  Service: Cardiovascular;  Laterality: N/A;   NODE DISSECTION N/A 05/22/2020   Procedure: NODE DISSECTION;  Surgeon: Lajuana Matte, MD;  Location: [facility];  Service: Thoracic;  Laterality: N/A;   VIDEO BRONCHOSCOPY WITH ENDOBRONCHIAL NAVIGATION N/A 05/09/2020   Procedure: VIDEO BRONCHOSCOPY WITH ENDOBRONCHIAL NAVIGATION;  Surgeon: [name], DO;  Location: [facility];  Service: Pulmonary;  Laterality: N/A;   VIDEO BRONCHOSCOPY WITH ENDOBRONCHIAL ULTRASOUND N/A 05/09/2020   Procedure: VIDEO BRONCHOSCOPY WITH ENDOBRONCHIAL  ULTRASOUND;  Surgeon: [name], DO;  Location: [facility];  Service: Pulmonary;  Laterality: N/A;   Patient Active Problem List   Diagnosis Date Noted   Ataxia 08/19/2022   Malignant neoplasm of lung ([place]) 05/29/2021   Acute pulmonary embolism ([place]) 05/04/2021   Non-small cell carcinoma of lung, stage 4, left ([place]) 06/19/2020   Goals of care, counseling/discussion 06/19/2020   S/P partial lobectomy of lung 05/22/2020   DCM (dilated cardiomyopathy) ([place]) 09/17/2015   OSA (obstructive sleep apnea) 07/12/2015   Bradycardia 08/29/2013   CAD (coronary artery disease), native coronary artery    RBBB    Essential hypertension, benign 08/18/2013   Hyperlipemia 08/18/2013    PCP: [name], MD  REFERRING PROVIDER: Nita Sells, MD   REFERRING DIAG: C34.90 (ICD-10-CM) - Malignant neoplasm of lung, unspecified laterality, unspecified part of lung ([place])   THERAPY DIAG:  Difficulty in walking, not elsewhere classified  Muscle weakness (generalized)  Malignant neoplasm of lung, unspecified laterality, unspecified part of lung ([place])  ONSET DATE: 03/20/22  Rationale for Evaluation and Treatment: Rehabilitation  SUBJECTIVE:                                                                                                                                                                                           SUBJECTIVE STATEMENT: I felt fine after last time. I have been getting tired faster but I am moving more.   PERTINENT HISTORY:  Stage 4 lung cancer with mets to bilateral adrenal glands and bone - T1, T10 (s/p lobectomy 2021, chemo and radiation). 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 Hx of CABG 2007 and pulmonary embolism 2022, partial lobectomy of lung 05/22/2020, also BPH, HTN and dilated cardiomyopathy  PAIN:  Are you having pain? No  PRECAUTIONS: Bony  mets  WEIGHT BEARING RESTRICTIONS: No  FALLS:  Has patient fallen in last 6 months? 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  LEISURE: pt reports he has not been able to exercise   HAND DOMINANCE: right   PRIOR LEVEL OF FUNCTION: Independent  PATIENT GOALS: to be able to walk on his own on decrease caregiver burden   OBJECTIVE:  COGNITION: Overall cognitive status: Within functional limits for tasks assessed   OBSERVATIONS / OTHER ASSESSMENTS: some increased shortness of breath with exercise followed by good recovery after seated recovery period  POSTURE: forward head, rounded shoulders   UPPER EXTREMITY STRENGTH: 5/5   LOWER EXTREMITY MMT:  STRENGTH Right eval  Hip flexion 4/5  Hip extension 4/5  Hip abduction 4+/5  Hip adduction   Hip internal rotation   Hip external rotation   Knee flexion 3+/5  Knee extension 5/5  Ankle dorsiflexion 4+/5  Ankle plantarflexion   Ankle inversion   Ankle eversion    (Blank rows = not tested)  A/PROM LEFT eval  Hip flexion 4/5  Hip extension 4/5  Hip abduction 4/5  Hip adduction   Hip internal rotation   Hip external rotation   Knee flexion 3+/5  Knee extension 5/5  Ankle dorsiflexion 4+/5  Ankle plantarflexion   Ankle inversion   Ankle eversion    (Blank rows = not tested)  FUNCTIONAL TESTS:  30 seconds chair stand test 10 reps Vitals prior: 96% and 66 bpm, then post 95% and 89 bpm  GAIT: Distance walked: 200 feet  Assistive device utilized: None Level of assistance: SBA  Comments: decreased trunk rotation, comes very close to obstacles and veers to the side if he is not looking straight ahead  BALANCE: L SLS - unable to maintain balance R  SLS - 15 sec   TODAY'S TREATMENT:                                                                                                                                         DATE:  09/07/2022 98, HR 65 Nu step level 3, seat 10, UE 9 x 3 min 94%, 99 BPM at 3 min 30 sec then x 2 min 30 sec was at 92, 83 BPM recovered to 95% and 76 bpm in less than 2 min then continued with O2 at 96% until pt reached 10 min  Hamstring stretch x 20 sec bilaterally x 2 Marching 2 x 10 no HH 94%O2 after first set then recovery period and then another set Heel raises 2 x 10 - O2 93%, HR 76 but quickly recovered to 96% within 1 min Sidestepping 4 lengths of bar 96%, 84 BPM Standing SLR flexion with no HHA v/c to keep knee straight, abd with no HHA, ext  with no HHA x 10, 94%, 88bpm Tandem stance bilaterally, held for count of 30 sec with no HHA and no loss of balance  Ambulation 324 feet, 02 90%, HR 90 but quickly to 95% and 72bpm after 1 min then another 324 feet O2 dropped to 89 half way through so took a standing recovery period then it increased to 93% then at the end was still at 93 and 77 bpm  09/03/2022 98, HR 65 Nu step level 2, seat 10, UE 9 x 3 min 96%, 85 BPM at 3 min then x 3 more min 94, 83 BPM recovered to 97% and 73bpm Marching 2 x 10 no HH 94%O2 after first set then recovery period and then another set, heel raises 2 x 10 Sidestepping 4 lengths of bar 96%, 75 BPM Standing SLR flexion with no HHA, abd with finger touch assist, ext with only finger tip touching bar x 10, 95%, 92bpm Sit to stand 2 x 5 6 in step ups 10 reps no HH,, O2 94%, HR 97 recovered quickly Tandem stance bilaterally, held for count of 30 sec with occasional finger tip touch for balance SLS x 3 B 4-8 sec, practiced how to wt. Shift properly to unwt the opposite side. Ambulation 600 feet, 02 90%, HR 90 but quickly to 95% and 72bpm after 2 min  08/31/2022 98, HR 65 Nu step level 2, seat 10, UE 9 x 3 min 93%, 75 BPM recovered quickly x 2  more min 97, 71 BPM Marching 2 x 10 no HH, heel raises 2 x 10 Sidestepping 4 lengths of bar 96%, 75 BPM Standing with HH,SLR flexion, abd ext x 10, 93%, 72 Sit to stand 2 x 5 6 in step ups 1 x 10 B no HH,, O2 94%, HR 97 recovered quickly  HR 79 Tandem stance bilaterally, held for count of 30 sec SLS x 3 B 4-8 sec, practiced how to wt. Shift properly to unwt the opposite side. Ambulation 485 feet, 02 90%, HR   08/27/22- none today due to time constraints  PATIENT EDUCATION:  Education details: pacing self especially when feeling short of breath Person educated: Patient Education method: Explanation Education comprehension: verbalized understanding  HOME EXERCISE PROGRAM: Access Code: [email or web address] URL: [email or web address] Date: 09/03/2022 Prepared by: Manus Gunning  Exercises - Sit to Stand Without Arm Support  - 1 x daily - 7 x weekly - 2 sets - 5 reps - Standing March with Counter Support  - 1 x daily - 7 x weekly - 1 sets - 10 reps - Standing Hip Abduction with Counter Support  - 1 x daily - 7 x weekly - 1 sets - 10 reps - Standing Hip Extension with Counter Support  - 1 x daily - 7 x weekly - 1 sets - 10 reps - Heel Raises with Counter Support  - 1 x daily - 7 x weekly - 1-2 sets - 10 reps - Standing Tandem Balance with Counter Support  - 1 x daily - 7 x weekly - 1 sets - 10 reps  ASSESSMENT:  CLINICAL IMPRESSION: Pt did much better in therapy today. His balance has improved significantly since his evaluation.  His O2 sats remained higher today throughout therapy. They dropped to 90 at one point but quickly increased back to 94. Encouraged pt and spouse to obtain a pulse ox to monitor his O2 when they are walking throughout the airport for a trip next week.   OBJECTIVE IMPAIRMENTS: Abnormal gait, decreased  balance, difficulty walking, decreased strength, and postural dysfunction.   ACTIVITY LIMITATIONS: stairs and ambulation  PARTICIPATION LIMITATIONS:  driving and community activity  PERSONAL FACTORS: Time since onset of injury/illness/exacerbation and 1 comorbidity: bony mets  are also affecting patient's functional outcome.   REHAB POTENTIAL: Good  CLINICAL DECISION MAKING: Stable/uncomplicated  EVALUATION COMPLEXITY: Low  GOALS: Goals reviewed with patient? Yes  SHORT TERM GOALS = LONG TERM GOALS: Target date: 09/24/22  Pt will demonstrate 5/5 bilateral hamstring strength to decrease risk of falls.  Baseline:  Goal status: INITIAL  2.  Pt will be able to ambulate for 500 feet independently without veering to the side.  Baseline:  Goal status: INITIAL  3.  Pt will be able to perform SLS on L LE for 15 sec to decrease fall risk.  Baseline:  Goal status: INITIAL  4.  Pt will be independent in a home exercise program for continued stretching and strengthening.  Baseline:  Goal status: INITIAL  5.  Pt will be able to ascend/descend 5 steps with no handrail safely and independently to decrease fall risk at home.  Baseline:  Goal status: INITIAL  PLAN:  PT FREQUENCY: 2x/week  PT DURATION: 4 weeks  PLANNED INTERVENTIONS: Therapeutic exercises, Therapeutic activity, Neuromuscular re-education, Balance training, Gait training, Patient/Family education, Self Care, Joint mobilization, Manual therapy, and Re-evaluation  PLAN FOR NEXT SESSION: **bony mets** NuStep while monitoring HR/O2, stair ambulation, gait training, high level balance activities, hamstring strengthening   [employer], PT 09/07/2022, 3:01 PM

## 2022-09-08 ENCOUNTER — Inpatient Hospital Stay (HOSPITAL_BASED_OUTPATIENT_CLINIC_OR_DEPARTMENT_OTHER): Payer: Medicare Other | Admitting: Hematology & Oncology

## 2022-09-08 ENCOUNTER — Other Ambulatory Visit: Payer: Self-pay

## 2022-09-08 ENCOUNTER — Encounter: Payer: Self-pay | Admitting: *Deleted

## 2022-09-08 ENCOUNTER — Encounter: Payer: Self-pay | Admitting: Hematology & Oncology

## 2022-09-08 ENCOUNTER — Inpatient Hospital Stay: Payer: Medicare Other

## 2022-09-08 ENCOUNTER — Inpatient Hospital Stay: Payer: Medicare Other | Attending: Hematology & Oncology

## 2022-09-08 ENCOUNTER — Ambulatory Visit (HOSPITAL_BASED_OUTPATIENT_CLINIC_OR_DEPARTMENT_OTHER)
Admission: RE | Admit: 2022-09-08 | Discharge: 2022-09-08 | Disposition: A | Discharge status: Home / Self Care | Payer: Medicare Other | Source: Ambulatory Visit | Attending: Hematology & Oncology | Admitting: Hematology & Oncology

## 2022-09-08 VITALS — BP 115/72 | HR 85 | Temp 98.2°F | Resp 19 | Wt 189.0 lb

## 2022-09-08 DIAGNOSIS — Z79899 Other long term (current) drug therapy: Secondary | ICD-10-CM | POA: Insufficient documentation

## 2022-09-08 DIAGNOSIS — C7971 Secondary malignant neoplasm of right adrenal gland: Secondary | ICD-10-CM | POA: Diagnosis not present

## 2022-09-08 DIAGNOSIS — R7303 Prediabetes: Secondary | ICD-10-CM | POA: Diagnosis not present

## 2022-09-08 DIAGNOSIS — C3412 Malignant neoplasm of upper lobe, left bronchus or lung: Secondary | ICD-10-CM | POA: Diagnosis not present

## 2022-09-08 DIAGNOSIS — J90 Pleural effusion, not elsewhere classified: Secondary | ICD-10-CM | POA: Diagnosis not present

## 2022-09-08 DIAGNOSIS — C3492 Malignant neoplasm of unspecified part of left bronchus or lung: Secondary | ICD-10-CM

## 2022-09-08 DIAGNOSIS — C7951 Secondary malignant neoplasm of bone: Secondary | ICD-10-CM | POA: Diagnosis not present

## 2022-09-08 DIAGNOSIS — R93811 Abnormal radiologic findings on diagnostic imaging of right testicle: Secondary | ICD-10-CM

## 2022-09-08 DIAGNOSIS — C7972 Secondary malignant neoplasm of left adrenal gland: Secondary | ICD-10-CM | POA: Diagnosis not present

## 2022-09-08 DIAGNOSIS — Z7901 Long term (current) use of anticoagulants: Secondary | ICD-10-CM | POA: Diagnosis not present

## 2022-09-08 DIAGNOSIS — C3490 Malignant neoplasm of unspecified part of unspecified bronchus or lung: Secondary | ICD-10-CM | POA: Diagnosis not present

## 2022-09-08 DIAGNOSIS — R0602 Shortness of breath: Secondary | ICD-10-CM | POA: Diagnosis not present

## 2022-09-08 LAB — CBC WITH DIFFERENTIAL (CANCER CENTER ONLY)
Abs Immature Granulocytes: 0.04 10*3/uL (ref 0.00–0.07)
Basophils Absolute: 0 10*3/uL (ref 0.0–0.1)
Basophils Relative: 0 %
Eosinophils Absolute: 0 10*3/uL (ref 0.0–0.5)
Eosinophils Relative: 0 %
HCT: 38.6 % — ABNORMAL LOW (ref 39.0–52.0)
Hemoglobin: 12.7 g/dL — ABNORMAL LOW (ref 13.0–17.0)
Immature Granulocytes: 0 %
Lymphocytes Relative: 6 %
Lymphs Abs: 0.5 10*3/uL — ABNORMAL LOW (ref 0.7–4.0)
MCH: 33.6 pg (ref 26.0–34.0)
MCHC: 32.9 g/dL (ref 30.0–36.0)
MCV: 102.1 fL — ABNORMAL HIGH (ref 80.0–100.0)
Monocytes Absolute: 0.3 10*3/uL (ref 0.1–1.0)
Monocytes Relative: 3 %
Neutro Abs: 8.2 10*3/uL — ABNORMAL HIGH (ref 1.7–7.7)
Neutrophils Relative %: 91 %
Platelet Count: 88 10*3/uL — ABNORMAL LOW (ref 150–400)
RBC: 3.78 MIL/uL — ABNORMAL LOW (ref 4.22–5.81)
RDW: 15 % (ref 11.5–15.5)
WBC Count: 9 10*3/uL (ref 4.0–10.5)
nRBC: 0 % (ref 0.0–0.2)

## 2022-09-08 LAB — CMP (CANCER CENTER ONLY)
ALT: 20 U/L (ref 0–44)
AST: 20 U/L (ref 15–41)
Albumin: 3.9 g/dL (ref 3.5–5.0)
Alkaline Phosphatase: 58 U/L (ref 38–126)
Anion gap: 10 (ref 5–15)
BUN: 19 mg/dL (ref 8–23)
CO2: 29 mmol/L (ref 22–32)
Calcium: 9.2 mg/dL (ref 8.9–10.3)
Chloride: 102 mmol/L (ref 98–111)
Creatinine: 1.23 mg/dL (ref 0.61–1.24)
GFR, Estimated: >60 mL/min (ref >60)
Glucose, Bld: 102 mg/dL — ABNORMAL HIGH (ref 70–99)
Potassium: 3.9 mmol/L (ref 3.5–5.1)
Sodium: 141 mmol/L (ref 135–145)
Total Bilirubin: 0.8 mg/dL (ref 0.3–1.2)
Total Protein: 7 g/dL (ref 6.5–8.1)

## 2022-09-08 LAB — LACTATE DEHYDROGENASE: LDH: 227 U/L — ABNORMAL HIGH (ref 98–192)

## 2022-09-08 LAB — TSH: TSH: 2.98 u[IU]/mL (ref 0.350–4.500)

## 2022-09-08 LAB — PREALBUMIN: Prealbumin: 31 mg/dL (ref 18–38)

## 2022-09-08 NOTE — Progress Notes (Signed)
Hematology and Oncology Follow Up Visit  John Hinton [id number] Nov 24, 1945 77 y.o. 09/08/2022   Principle Diagnosis:  Stage IVA (T3N2N0) adenocarcinoma of the LUL -- resected -- (+) KRAS -- progression - metastasis to right lung, bilateral adrenal glands, bony metastasis to T1, Y 10 and left iliac bone PE involving the right lower lobe and right middle lobe branches, no right heart strain noted  Past Therapy: S/P cycle 6 of Carbo/Alimta/Pembrolizumab Alimta/pembrolizumab-maintenance therapy s/p cycle 3/4  - start on 11/28/2020 --Alimta discontinued after cycle 2   Current Therapy:        Lumakras 360 mg po q day -- started on 07/31/2021 -- d/c on 02/07/203 for toxicity Xgeva every 3 months - due again 09/2022 Eliquis 5 mg PO BID Adagrasib  (Krazati)400 mg po BID -- changed on 07/25/2022 Nivolumab 480mg  IV q month -- start on 07/29/2022 --hold starting 08/17/2022 - +   Interim History:  John Hinton is here today for follow-up.  He has been hospitalized since we last saw him.  He was have a lot of neurological issues.  He ultimately had a lumbar puncture.  Thankfully, there is no evidence of carcinomatous meningitis.  He had no evidence of malignancy in the brain on MRI.  Neurology saw him.  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  I sent off CSF for this.  I do not have the results back yet.  They also felt that the nivolumab may have been a problem.  I know this would be a very rare side effect from immunotherapy.  He is on prednisone now.  His prednisone is being tapered.  He certainly does look quite good.  His main complaint has been shortness of breath.  We will get a chest x-ray on him today.  He and his wife are planning going to [place], [place].  There was go yearly.  Hopefully, the high altitude will not affect his breathing.  He is back on the adagrasib.  I think he is doing fairly well on the adagrasib.  He continues on the Eliquis.  He has had no  bleeding.  He has had no fever.  There is been no rashes.  He has had no leg swelling.  I know that he has some cardiac issues.  He did have an echocardiogram done on 08/07/2022.  This showed an ejection fraction of 50%.  He is eating better.  I think the prednisone probably has helped this.  He is on 40 mg of prednisone daily.  I told to cut down to 20 mg daily and take this through his trip out to [place].  When he comes back, he can then go down to 10 mg a day.  Overall, I would say that his performance status is probably ECOG 1.    Medications:  Allergies as of 09/08/2022       Reactions   Amlodipine Swelling   Swelling of ankles        Medication List        Accurate as of September 08, 2022 10:21 AM. If you have any questions, ask your nurse or doctor.          aspirin 81 MG tablet Take 81 mg by mouth at bedtime.   B-12 PO Take 1,000 mcg by mouth daily.   colchicine 0.6 MG tablet Take 1 tablet (0.6 mg total) by mouth every 8 (eight) hours as needed.   diphenoxylate-atropine 2.5-0.025 MG tablet Commonly known as: LOMOTIL TAKE 2  TABLETS BY MOUTH AT ONSET OF DIARRHEA THEN TAKE 1 TABLET AFTER EACH LOOSE STOOL MAX 8 TABLETS PER DAY   Eliquis 5 MG Tabs tablet Generic drug: apixaban TAKE 1 TABLET BY MOUTH TWICE A DAY   Entresto 24-26 MG Generic drug: sacubitril-valsartan TAKE ONE TABLET BY MOUTH TWICE A DAY   ezetimibe 10 MG tablet Commonly known as: ZETIA Take 1 tablet (10 mg total) by mouth daily.   folic acid 1 MG tablet Commonly known as: FOLVITE TAKE ONE TABLET BY MOUTH DAILY START 5 TO 7 DAYS PRIOR TO CHEMO AND CONTINUE UNTIL 21 DAYS AFTER COMPLETES   levothyroxine 50 MCG tablet Commonly known as: Synthroid Take 1 tablet (50 mcg total) by mouth daily before breakfast.   metoprolol succinate 25 MG 24 hr tablet Commonly known as: TOPROL-XL TAKE ONE TABLET BY MOUTH EVERY NIGHT AT BEDTIME   multivitamin capsule Take 1 capsule by mouth daily.    nitroGLYCERIN 0.4 MG SL tablet Commonly known as: NITROSTAT Place 1 tablet (0.4 mg total) under the tongue every 5 (five) minutes as needed for chest pain.   oxyCODONE-acetaminophen 7.5-325 MG tablet Commonly known as: Percocet Take 1 tablet by mouth every 4 (four) hours as needed for severe pain.   pantoprazole 40 MG tablet Commonly known as: PROTONIX Take 1 tablet (40 mg total) by mouth daily.   predniSONE 10 MG tablet Commonly known as: DELTASONE Take 1 tablet (10 mg total) by mouth daily with breakfast.   predniSONE 20 MG tablet Commonly known as: DELTASONE Take 3 tablets (60 mg total) by mouth daily with breakfast for 5 days, THEN 2 tablets (40 mg total) daily with breakfast for 25 days. Continue 40 mg dose until discontinued by Physician.. Start taking on: August 26, 2022   rosuvastatin 40 MG tablet Commonly known as: CRESTOR TAKE ONE TABLET BY MOUTH EVERY NIGHT AT BEDTIME   sertraline 100 MG tablet Commonly known as: ZOLOFT Take 100 mg by mouth 2 (two) times daily.   sucralfate 1 g tablet Commonly known as: Carafate Take 1 tablet (1 g total) by mouth 4 (four) times daily. Dissolve each tablet in 15 cc water before use. What changed:  when to take this reasons to take this   traMADol 50 MG tablet Commonly known as: ULTRAM Take 1 tablet (50 mg total) by mouth every 6 (six) hours as needed.        Allergies:  Allergies  Allergen Reactions   Amlodipine Swelling    Swelling of ankles    Past Medical History, Surgical history, Social history, and Family History were reviewed and updated.  Review of Systems: Review of Systems  Constitutional: Negative.   HENT: Negative.    Eyes: Negative.   Respiratory: Negative.    Cardiovascular: Negative.   Gastrointestinal: Negative.   Genitourinary: Negative.   Musculoskeletal: Negative.   Skin: Negative.   Neurological: Negative.   Endo/Heme/Allergies: Negative.   Psychiatric/Behavioral: Negative.        Physical Exam:  weight is 189 lb (85.7 kg). His oral temperature is 98.2 F (36.8 C). His blood pressure is 115/72 and his pulse is 85. His respiration is 19 and oxygen saturation is 99%.   Wt Readings from Last 3 Encounters:  09/08/22 189 lb (85.7 kg)  08/25/22 187 lb 6.3 oz (85 kg)  08/17/22 185 lb (83.9 kg)    Physical Exam Vitals reviewed.  HENT:     Head: Normocephalic and atraumatic.  Eyes:     Pupils: Pupils are equal,  round, and reactive to light.  Cardiovascular:     Rate and Rhythm: Normal rate and regular rhythm.     Heart sounds: Normal heart sounds.  Pulmonary:     Effort: Pulmonary effort is normal.     Breath sounds: Normal breath sounds.  Abdominal:     General: Bowel sounds are normal.     Palpations: Abdomen is soft.  Musculoskeletal:        General: No tenderness or deformity. Normal range of motion.     Cervical back: Normal range of motion.  Lymphadenopathy:     Cervical: No cervical adenopathy.  Skin:    General: Skin is warm and dry.     Findings: No erythema or rash.  Neurological:     Mental Status: He is alert and oriented to person, place, and time.     Comments: Neurological exam does show some slight neurological deficit with respect to cerebellar function.  Psychiatric:        Behavior: Behavior normal.        Thought Content: Thought content normal.        Judgment: Judgment normal.    Lab Results  Component Value Date   WBC 9.0 09/08/2022   HGB 12.7 (L) 09/08/2022   HCT 38.6 (L) 09/08/2022   MCV 102.1 (H) 09/08/2022   PLT 88 (L) 09/08/2022   Lab Results  Component Value Date   FERRITIN 731 (H) 06/01/2022   IRON 73 06/01/2022   TIBC 311 06/01/2022   UIBC 238 06/01/2022   IRONPCTSAT 24 06/01/2022   Lab Results  Component Value Date   RETICCTPCT 2.7 06/01/2022   RBC 3.78 (L) 09/08/2022   No results found for: "KPAFRELGTCHN", "LAMBDASER", "KAPLAMBRATIO" Lab Results  Component Value Date   IGGSERUM 535 (L) 08/21/2022    Lab Results  Component Value Date   TOTALPROTELP 5.8 (L) 08/19/2022   ALBUMINELP 3.4 08/19/2022   A1GS 0.3 08/19/2022   A2GS 0.8 08/19/2022   BETS 0.9 08/19/2022   GAMS 0.5 08/19/2022   MSPIKE Not Observed 08/19/2022   SPEI Comment 08/19/2022     Chemistry      Component Value Date/Time   NA 139 08/25/2022 0633   NA 142 06/19/2021 1032   K 3.6 08/25/2022 0633   CL 107 08/25/2022 0633   CO2 24 08/25/2022 0633   BUN 26 (H) 08/25/2022 0633   BUN 12 06/19/2021 1032   CREATININE 0.95 08/25/2022 0633   CREATININE 1.37 (H) 08/17/2022 0842   CREATININE 1.01 03/26/2016 1009      Component Value Date/Time   CALCIUM 7.9 (L) 08/25/2022 0633   ALKPHOS 40 08/19/2022 1438   AST 21 08/19/2022 1438   AST 18 08/17/2022 0842   ALT 20 08/19/2022 1438   ALT 17 08/17/2022 0842   BILITOT 0.7 08/19/2022 1438   BILITOT 0.5 08/17/2022 0842       Impression and Plan: Mr. Kirsh is a very pleasant 77 yo caucasian male with an atypical stage IV lung cancer which was previously resected followed by systemic chemotherapy and maintenance pembrolizumab.   When he recurred, we put him on Lumakras.  He could not tolerate this because of elevated liver function studies.  We now have him on adagrasib.  We have him on full dose.  We will try to find the results from the paraneoplastic evaluation.  Hopefully, the results will be over at the hospital.  We will go ahead and set him up with another PET scan.  This  will be helpful.  The last PET scan was done back in December.  I will wait until he comes back from [place].  I think that he really needs to go out there and have a wonderful time.  I know that he is very motivated.  I know his wife is incredibly motivated to help his quality of life.  When we see him back, we will give him his Xgeva.  Again, everything we do is geared toward his quality of life.   [name], MD 2/20/202410:21 AM

## 2022-09-08 NOTE — Progress Notes (Signed)
Patient needs a PET scan prior to his next appointment. Patient is planning out of state travel, therefor MyChart message sent requesting that he, or his wife schedule the PET per their travel schedule and availability.   Oncology Nurse Navigator Documentation     09/08/2022    2:15 PM  Oncology Nurse Navigator Flowsheets  Navigator Follow Up Date: 09/28/2022  Navigator Follow Up Reason: Scan Review  Navigator Location [facility]  Navigator Encounter Type MyChart;Appt/Treatment Plan Review  Patient Visit Type MedOnc  Treatment Phase Active Tx  Barriers/Navigation Needs Coordination of Care  Interventions Education  Acuity Level 2-Minimal Needs (1-2 Barriers Identified)  Education Method Written  Support Groups/Services Friends and Family  Time Spent with Patient 15

## 2022-09-09 ENCOUNTER — Ambulatory Visit: Payer: Medicare Other | Admitting: Physical Therapy

## 2022-09-09 ENCOUNTER — Encounter: Payer: Self-pay | Admitting: Physical Therapy

## 2022-09-09 ENCOUNTER — Other Ambulatory Visit: Payer: Self-pay

## 2022-09-09 ENCOUNTER — Encounter: Payer: Self-pay | Admitting: Hematology & Oncology

## 2022-09-09 DIAGNOSIS — R262 Difficulty in walking, not elsewhere classified: Secondary | ICD-10-CM | POA: Diagnosis not present

## 2022-09-09 DIAGNOSIS — C3490 Malignant neoplasm of unspecified part of unspecified bronchus or lung: Secondary | ICD-10-CM | POA: Diagnosis not present

## 2022-09-09 DIAGNOSIS — M6281 Muscle weakness (generalized): Secondary | ICD-10-CM | POA: Diagnosis not present

## 2022-09-09 NOTE — Therapy (Signed)
OUTPATIENT PHYSICAL THERAPY ONCOLOGY TREATMENT  Patient Name: John Hinton MRN: [id number] DOB:May 24, 1946, 77 y.o., male Today's Date: 09/09/2022  END OF SESSION:  PT End of Session - 09/09/22 1207     Visit Number 5    Number of Visits 9    Date for PT Re-Evaluation 09/24/22    PT Start Time 1205              Past Medical History:  Diagnosis Date   BPH (benign prostatic hypertrophy)    Bradycardia    a. H/o asymptomatic bradycardia.   Coronary artery disease    a. s/p CABG ~2007 with LIMA to LAD, SVG seq to OM1 and OM2, SVG to PDA and SVG to diag. b. Abnormal nuc 03/2015 - s/p DES to SVG-OM1-OM2. 01/29/17 POBA with cutting balloon to SVG-->OM.  No ischemia Lexi myoview 2021   DCM (dilated cardiomyopathy) ([place]) 09/17/2015   mixed by cMRI 03/2021 with EF 39% with non viable infarct in basal to mid lateral wall and LGE pattern c/w nonischemic component as well. Mild RV dysfunction RVEF 42%.   Dyslipidemia    Dyspnea    r/t lung mass   ED (erectile dysfunction)    Fear of heights    Goals of care, counseling/discussion 06/19/2020   History of radiation therapy    T spine and left ilium 10/07/2021-10/20/2021  Dr Gery Pray   Hypertension    Non-small cell carcinoma of lung, stage 4, left ([place]) 06/19/2020   OSA (obstructive sleep apnea) 07/12/2015   Moderate OSA with AHI 16.5/hr now on CPAP at 8cm H2O, uses cpap nightly      RBBB    Shoulder, capsulitis, adhesive    Left Shoulder   Past Surgical History:  Procedure Laterality Date   APPENDECTOMY     BRONCHIAL BIOPSY  05/09/2020   Procedure: BRONCHIAL BIOPSIES;  Surgeon: [name], DO;  Location: [facility];  Service: Pulmonary;;   BRONCHIAL BRUSHINGS  05/09/2020   Procedure: BRONCHIAL BRUSHINGS;  Surgeon: [name], DO;  Location: [facility];  Service: Pulmonary;;   BRONCHIAL NEEDLE ASPIRATION BIOPSY  05/09/2020   Procedure: BRONCHIAL NEEDLE ASPIRATION BIOPSIES;  Surgeon: [name], DO;   Location: [facility];  Service: Pulmonary;;   BRONCHIAL WASHINGS  05/09/2020   Procedure: BRONCHIAL WASHINGS;  Surgeon: [name], DO;  Location: [facility];  Service: Pulmonary;;   CARDIAC CATHETERIZATION N/A 04/05/2015   Procedure: Left Heart Cath and Coronary Angiography;  Surgeon: Sherren Mocha, MD;  Location: [facility];  Service: Cardiovascular;  Laterality: N/A;   COLONOSCOPY     CORONARY ARTERY BYPASS GRAFT     w/LIMA to LAD, seq SVG to OM1 and OM2, SVG to PDA and SVG to Diagonal   CORONARY BALLOON ANGIOPLASTY N/A 01/29/2017   Procedure: Coronary Balloon Angioplasty;  Surgeon: Martinique, Peter M, MD;  Location: [facility];  Service: Cardiovascular;  Laterality: N/A;   FIDUCIAL MARKER PLACEMENT  05/09/2020   Procedure: FIDUCIAL MARKER PLACEMENT;  Surgeon: [name], DO;  Location: [facility];  Service: Pulmonary;;   INTERCOSTAL NERVE BLOCK Left 05/22/2020   Procedure: INTERCOSTAL NERVE BLOCK;  Surgeon: Lajuana Matte, MD;  Location: [facility];  Service: Thoracic;  Laterality: Left;   LEFT HEART CATH AND CORS/GRAFTS ANGIOGRAPHY N/A 01/29/2017   Procedure: Left Heart Cath and Cors/Grafts Angiography;  Surgeon: Martinique, Peter M, MD;  Location: [facility];  Service: Cardiovascular;  Laterality: N/A;   NODE DISSECTION N/A 05/22/2020  Procedure: NODE DISSECTION;  Surgeon: Lajuana Matte, MD;  Location: [facility];  Service: Thoracic;  Laterality: N/A;   VIDEO BRONCHOSCOPY WITH ENDOBRONCHIAL NAVIGATION N/A 05/09/2020   Procedure: VIDEO BRONCHOSCOPY WITH ENDOBRONCHIAL NAVIGATION;  Surgeon: [name], DO;  Location: [facility];  Service: Pulmonary;  Laterality: N/A;   VIDEO BRONCHOSCOPY WITH ENDOBRONCHIAL ULTRASOUND N/A 05/09/2020   Procedure: VIDEO BRONCHOSCOPY WITH ENDOBRONCHIAL ULTRASOUND;  Surgeon: [name], DO;  Location: [facility];  Service: Pulmonary;  Laterality: N/A;   Patient Active Problem List   Diagnosis Date Noted    Ataxia 08/19/2022   Malignant neoplasm of lung ([place]) 05/29/2021   Acute pulmonary embolism ([place]) 05/04/2021   Non-small cell carcinoma of lung, stage 4, left ([place]) 06/19/2020   Goals of care, counseling/discussion 06/19/2020   S/P partial lobectomy of lung 05/22/2020   DCM (dilated cardiomyopathy) ([place]) 09/17/2015   OSA (obstructive sleep apnea) 07/12/2015   Bradycardia 08/29/2013   CAD (coronary artery disease), native coronary artery    RBBB    Essential hypertension, benign 08/18/2013   Hyperlipemia 08/18/2013    PCP: [name], MD  REFERRING PROVIDER: Nita Sells, MD   REFERRING DIAG: C34.90 (ICD-10-CM) - Malignant neoplasm of lung, unspecified laterality, unspecified part of lung ([place])   THERAPY DIAG:  Difficulty in walking, not elsewhere classified  Muscle weakness (generalized)  Malignant neoplasm of lung, unspecified laterality, unspecified part of lung ([place])  ONSET DATE: 03/20/22  Rationale for Evaluation and Treatment: Rehabilitation  SUBJECTIVE:                                                                                                                                                                                           SUBJECTIVE STATEMENT: I have been feeling more short of breath today. I feel fine other than that. I saw the oncologist yesterday and he was happy with everything.   PERTINENT HISTORY:  Stage 4 lung cancer with mets to bilateral adrenal glands and bone - T1, T10 (s/p lobectomy 2021, chemo and radiation). 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 Hx of CABG 2007 and pulmonary embolism 2022, partial lobectomy of lung 05/22/2020, also BPH, HTN and dilated cardiomyopathy  PAIN:  Are you having pain? No  PRECAUTIONS: Bony mets  WEIGHT BEARING RESTRICTIONS: No  FALLS:  Has patient fallen in last 6 months? 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  LEISURE: pt reports he has not been able to exercise   HAND DOMINANCE: right   PRIOR LEVEL OF FUNCTION: Independent  PATIENT GOALS: to be able to walk on his own on decrease caregiver burden   OBJECTIVE:  COGNITION: Overall cognitive status: Within functional limits for tasks assessed   OBSERVATIONS / OTHER ASSESSMENTS: some increased shortness of breath with exercise followed by good recovery after seated recovery period  POSTURE: forward head, rounded shoulders   UPPER EXTREMITY STRENGTH: 5/5   LOWER EXTREMITY MMT:  STRENGTH Right eval  Hip flexion 4/5  Hip extension 4/5  Hip abduction 4+/5  Hip adduction   Hip internal rotation   Hip external rotation   Knee flexion 3+/5  Knee extension 5/5  Ankle dorsiflexion 4+/5  Ankle plantarflexion   Ankle inversion   Ankle eversion    (Blank rows = not tested)  A/PROM LEFT eval  Hip flexion 4/5  Hip extension 4/5  Hip abduction 4/5  Hip adduction   Hip internal rotation   Hip external rotation   Knee flexion 3+/5  Knee extension 5/5  Ankle dorsiflexion 4+/5  Ankle plantarflexion   Ankle inversion   Ankle eversion    (Blank rows = not tested)  FUNCTIONAL TESTS:  30 seconds chair stand test 10 reps Vitals prior: 96% and 66 bpm, then post 95% and 89 bpm  GAIT: Distance walked: 200 feet  Assistive device utilized: None Level of assistance: SBA  Comments: decreased trunk rotation, comes very close to obstacles and veers to the side if he is not looking straight ahead  BALANCE: L SLS - unable to maintain balance R SLS - 15 sec   TODAY'S TREATMENT:                                                                                                                                          DATE:  09/03/2022 98, HR 65 Nu step level 3, seat 10, UE 9 x 3 min 97%, 78 BPM  then at 10 min O2 was 95 and HR was 82 Hamstring stretch x 20 sec bilaterally x 2 Stepping over hurdles with step through gait and HHA on bars x 4 then side stepping over hurdles with step to and occasional HHA O2 95% HR 116 bpm - stopped for seated recovery period due to increased HR - quickly recovered to 90 bpm after about 2 min  Marching x20 no HH 97% O2 but pt with increased shortness of breath and HR 113 Heel raises x 20 - O2 99%, HR 87  Standing SLR flexion with no HHA 97 O2, 76 bpm, abd with occasional HHA O2 96, HR 76, ext with HHA x 10, 97%, 78bpm Ambulation 351 feet, 02 97%, HR 136 half way through then had pt take  a seated recovery period due to high heart rate and it quickly decreased to 88 bpm   09/07/2022 98, HR 65 Nu step level 3, seat 10, UE 9 x 3 min 94%, 99 BPM at 3 min 30 sec then x 2 min 30 sec was at 92, 83 BPM recovered to 95% and 76 bpm in less than 2 min then continued with O2 at 96% until pt reached 10 min  Hamstring stretch x 20 sec bilaterally x 2 Marching 2 x 10 no HH 94%O2 after first set then recovery period and then another set Heel raises 2 x 10 - O2 93%, HR 76 but quickly recovered to 96% within 1 min Sidestepping 4 lengths of bar 96%, 84 BPM Standing SLR flexion with no HHA v/c to keep knee straight, abd with no HHA, ext with no HHA x 10, 94%, 88bpm Tandem stance bilaterally, held for count of 30 sec with no HHA and no loss of balance  Ambulation 324 feet, 02 90%, HR 90 but quickly to 95% and 72bpm after 1 min then another 324 feet O2 dropped to 89 half way through so took a standing recovery period then it increased to 93% then at the end was still at 93 and 77 bpm  09/03/2022 98, HR 65 Nu step level 2, seat 10, UE 9 x 3 min 96%, 85 BPM at 3 min then x 3 more min 94, 83 BPM recovered to 97% and 73bpm Marching 2  x 10 no HH 94%O2 after first set then recovery period and then another set, heel raises 2 x 10 Sidestepping 4 lengths of bar 96%, 75 BPM Standing SLR flexion with no HHA, abd with finger touch assist, ext with only finger tip touching bar x 10, 95%, 92bpm Sit to stand 2 x 5 6 in step ups 10 reps no HH,, O2 94%, HR 97 recovered quickly Tandem stance bilaterally, held for count of 30 sec with occasional finger tip touch for balance SLS x 3 B 4-8 sec, practiced how to wt. Shift properly to unwt the opposite side. Ambulation 600 feet, 02 90%, HR 90 but quickly to 95% and 72bpm after 2 min  08/31/2022 98, HR 65 Nu step level 2, seat 10, UE 9 x 3 min 93%, 75 BPM recovered quickly x 2 more min 97, 71 BPM Marching 2 x 10 no HH, heel raises 2 x 10 Sidestepping 4 lengths of bar 96%, 75 BPM Standing with HH,SLR flexion, abd ext x 10, 93%, 72 Sit to stand 2 x 5 6 in step ups 1 x 10 B no HH,, O2 94%, HR 97 recovered quickly  HR 79 Tandem stance bilaterally, held for count of 30 sec SLS x 3 B 4-8 sec, practiced how to wt. Shift properly to unwt the opposite side. Ambulation 485 feet, 02 90%, HR   08/27/22- none today due to time constraints  PATIENT EDUCATION:  Education details: pacing self especially when feeling short of breath Person educated: Patient Education method: Explanation Education comprehension: verbalized understanding  HOME EXERCISE PROGRAM: Access Code: [email or web address] URL: [email or web address] Date: 09/03/2022 Prepared by: Manus Gunning  Exercises - Sit to Stand Without Arm Support  - 1 x daily - 7 x weekly - 2 sets - 5 reps - Standing March with Counter Support  - 1 x daily - 7 x weekly - 1 sets - 10 reps - Standing Hip Abduction with Counter Support  - 1 x daily - 7 x weekly - 1 sets -  10 reps - Standing Hip Extension with Counter Support  - 1 x daily - 7 x weekly - 1 sets - 10 reps - Heel Raises with Counter Support  - 1 x daily - 7 x weekly - 1-2 sets -  10 reps - Standing Tandem Balance with Counter Support  - 1 x daily - 7 x weekly - 1 sets - 10 reps  ASSESSMENT:  CLINICAL IMPRESSION: Pt reports he is more short of breath today but he reports he felt good. Monitored his O2 and HR throughout the session today. His O2 sats did much better today and never dropped below 94. His HR was higher than it was at last session. He had to have several seated recovery periods due to his increased heart rate. Educated pt on importance of drinking enough water since sometimes dehydration can cause this. Pt was also educated to [place] and [place] in the [place] and to take seated recovery periods as needed.   OBJECTIVE IMPAIRMENTS: Abnormal gait, decreased balance, difficulty walking, decreased strength, and postural dysfunction.   ACTIVITY LIMITATIONS: stairs and ambulation  PARTICIPATION LIMITATIONS: driving and community activity  PERSONAL FACTORS: Time since onset of injury/illness/exacerbation and 1 comorbidity: bony mets  are also affecting patient's functional outcome.   REHAB POTENTIAL: Good  CLINICAL DECISION MAKING: Stable/uncomplicated  EVALUATION COMPLEXITY: Low  GOALS: Goals reviewed with patient? Yes  SHORT TERM GOALS = LONG TERM GOALS: Target date: 09/24/22  Pt will demonstrate 5/5 bilateral hamstring strength to decrease risk of falls.  Baseline:  Goal status: INITIAL  2.  Pt will be able to ambulate for 500 feet independently without veering to the side.  Baseline:  Goal status: INITIAL  3.  Pt will be able to perform SLS on L LE for 15 sec to decrease fall risk.  Baseline:  Goal status: INITIAL  4.  Pt will be independent in a home exercise program for continued stretching and strengthening.  Baseline:  Goal status: INITIAL  5.  Pt will be able to ascend/descend 5 steps with no handrail safely and independently to decrease fall risk at home.  Baseline:  Goal status: INITIAL  PLAN:  PT FREQUENCY: 2x/week  PT  DURATION: 4 weeks  PLANNED INTERVENTIONS: Therapeutic exercises, Therapeutic activity, Neuromuscular re-education, Balance training, Gait training, Patient/Family education, Self Care, Joint mobilization, Manual therapy, and Re-evaluation  PLAN FOR NEXT SESSION: **bony mets** NuStep while monitoring HR/O2, stair ambulation, gait training, high level balance activities, hamstring strengthening   [employer], PT 09/09/2022, 1:02 PM

## 2022-09-10 ENCOUNTER — Telehealth: Payer: Self-pay

## 2022-09-10 NOTE — Telephone Encounter (Signed)
Advised via MyChart.

## 2022-09-10 NOTE — Telephone Encounter (Signed)
-----   Message from [name], MD sent at 09/10/2022  2:00 PM EST ----- Please call and let him know that the fluid in the left lung is less.  This is a good sign.  He does not have any obvious pneumonia.  Please have a great time in [place].  John Hinton

## 2022-09-11 LAB — MISC LABCORP TEST (SEND OUT)
Labcorp test code: 9985
Labcorp test code: 9985

## 2022-09-20 ENCOUNTER — Encounter: Payer: Self-pay | Admitting: Hematology & Oncology

## 2022-09-21 ENCOUNTER — Ambulatory Visit: Payer: Medicare Other | Admitting: Physical Therapy

## 2022-09-21 ENCOUNTER — Other Ambulatory Visit: Payer: Self-pay

## 2022-09-21 ENCOUNTER — Inpatient Hospital Stay (HOSPITAL_COMMUNITY)
Admission: EM | Admit: 2022-09-21 | Discharge: 2022-09-25 | DRG: 175 | Disposition: A | Discharge status: Home / Self Care | Payer: Medicare Other | Attending: Internal Medicine | Admitting: Internal Medicine

## 2022-09-21 ENCOUNTER — Emergency Department (HOSPITAL_COMMUNITY): Payer: Medicare Other

## 2022-09-21 ENCOUNTER — Encounter (HOSPITAL_COMMUNITY): Payer: Self-pay

## 2022-09-21 DIAGNOSIS — Z79899 Other long term (current) drug therapy: Secondary | ICD-10-CM | POA: Diagnosis not present

## 2022-09-21 DIAGNOSIS — J910 Malignant pleural effusion: Secondary | ICD-10-CM | POA: Diagnosis present

## 2022-09-21 DIAGNOSIS — C3412 Malignant neoplasm of upper lobe, left bronchus or lung: Secondary | ICD-10-CM | POA: Diagnosis present

## 2022-09-21 DIAGNOSIS — D649 Anemia, unspecified: Secondary | ICD-10-CM | POA: Diagnosis present

## 2022-09-21 DIAGNOSIS — I82813 Embolism and thrombosis of superficial veins of lower extremities, bilateral: Secondary | ICD-10-CM | POA: Diagnosis present

## 2022-09-21 DIAGNOSIS — Z818 Family history of other mental and behavioral disorders: Secondary | ICD-10-CM

## 2022-09-21 DIAGNOSIS — Y92009 Unspecified place in unspecified non-institutional (private) residence as the place of occurrence of the external cause: Secondary | ICD-10-CM | POA: Diagnosis not present

## 2022-09-21 DIAGNOSIS — I10 Essential (primary) hypertension: Secondary | ICD-10-CM | POA: Diagnosis not present

## 2022-09-21 DIAGNOSIS — J9601 Acute respiratory failure with hypoxia: Secondary | ICD-10-CM | POA: Diagnosis not present

## 2022-09-21 DIAGNOSIS — C7801 Secondary malignant neoplasm of right lung: Secondary | ICD-10-CM | POA: Diagnosis present

## 2022-09-21 DIAGNOSIS — I4891 Unspecified atrial fibrillation: Secondary | ICD-10-CM

## 2022-09-21 DIAGNOSIS — W19XXXA Unspecified fall, initial encounter: Secondary | ICD-10-CM

## 2022-09-21 DIAGNOSIS — Z79890 Hormone replacement therapy (postmenopausal): Secondary | ICD-10-CM | POA: Diagnosis not present

## 2022-09-21 DIAGNOSIS — C7972 Secondary malignant neoplasm of left adrenal gland: Secondary | ICD-10-CM | POA: Diagnosis present

## 2022-09-21 DIAGNOSIS — I2609 Other pulmonary embolism with acute cor pulmonale: Secondary | ICD-10-CM | POA: Diagnosis not present

## 2022-09-21 DIAGNOSIS — W1830XA Fall on same level, unspecified, initial encounter: Secondary | ICD-10-CM | POA: Diagnosis present

## 2022-09-21 DIAGNOSIS — N179 Acute kidney failure, unspecified: Secondary | ICD-10-CM

## 2022-09-21 DIAGNOSIS — I110 Hypertensive heart disease with heart failure: Secondary | ICD-10-CM | POA: Diagnosis present

## 2022-09-21 DIAGNOSIS — I959 Hypotension, unspecified: Secondary | ICD-10-CM | POA: Diagnosis not present

## 2022-09-21 DIAGNOSIS — E785 Hyperlipidemia, unspecified: Secondary | ICD-10-CM | POA: Diagnosis present

## 2022-09-21 DIAGNOSIS — C3490 Malignant neoplasm of unspecified part of unspecified bronchus or lung: Secondary | ICD-10-CM | POA: Diagnosis not present

## 2022-09-21 DIAGNOSIS — Z1152 Encounter for screening for COVID-19: Secondary | ICD-10-CM

## 2022-09-21 DIAGNOSIS — C3492 Malignant neoplasm of unspecified part of left bronchus or lung: Secondary | ICD-10-CM | POA: Diagnosis not present

## 2022-09-21 DIAGNOSIS — N400 Enlarged prostate without lower urinary tract symptoms: Secondary | ICD-10-CM | POA: Diagnosis present

## 2022-09-21 DIAGNOSIS — I5022 Chronic systolic (congestive) heart failure: Secondary | ICD-10-CM | POA: Diagnosis not present

## 2022-09-21 DIAGNOSIS — I2510 Atherosclerotic heart disease of native coronary artery without angina pectoris: Secondary | ICD-10-CM

## 2022-09-21 DIAGNOSIS — R410 Disorientation, unspecified: Secondary | ICD-10-CM | POA: Diagnosis not present

## 2022-09-21 DIAGNOSIS — E039 Hypothyroidism, unspecified: Secondary | ICD-10-CM | POA: Diagnosis not present

## 2022-09-21 DIAGNOSIS — C7951 Secondary malignant neoplasm of bone: Secondary | ICD-10-CM | POA: Diagnosis present

## 2022-09-21 DIAGNOSIS — J90 Pleural effusion, not elsewhere classified: Secondary | ICD-10-CM | POA: Diagnosis not present

## 2022-09-21 DIAGNOSIS — I2699 Other pulmonary embolism without acute cor pulmonale: Secondary | ICD-10-CM

## 2022-09-21 DIAGNOSIS — E876 Hypokalemia: Secondary | ICD-10-CM

## 2022-09-21 DIAGNOSIS — R270 Ataxia, unspecified: Secondary | ICD-10-CM

## 2022-09-21 DIAGNOSIS — R0902 Hypoxemia: Secondary | ICD-10-CM | POA: Diagnosis not present

## 2022-09-21 DIAGNOSIS — Z801 Family history of malignant neoplasm of trachea, bronchus and lung: Secondary | ICD-10-CM

## 2022-09-21 DIAGNOSIS — Z66 Do not resuscitate: Secondary | ICD-10-CM | POA: Diagnosis present

## 2022-09-21 DIAGNOSIS — Z955 Presence of coronary angioplasty implant and graft: Secondary | ICD-10-CM

## 2022-09-21 DIAGNOSIS — I2602 Saddle embolus of pulmonary artery with acute cor pulmonale: Secondary | ICD-10-CM | POA: Diagnosis not present

## 2022-09-21 DIAGNOSIS — I480 Paroxysmal atrial fibrillation: Secondary | ICD-10-CM | POA: Diagnosis present

## 2022-09-21 DIAGNOSIS — I82409 Acute embolism and thrombosis of unspecified deep veins of unspecified lower extremity: Secondary | ICD-10-CM

## 2022-09-21 DIAGNOSIS — E7800 Pure hypercholesterolemia, unspecified: Secondary | ICD-10-CM

## 2022-09-21 DIAGNOSIS — Z7901 Long term (current) use of anticoagulants: Secondary | ICD-10-CM

## 2022-09-21 DIAGNOSIS — Z86711 Personal history of pulmonary embolism: Secondary | ICD-10-CM

## 2022-09-21 DIAGNOSIS — R0602 Shortness of breath: Secondary | ICD-10-CM | POA: Diagnosis not present

## 2022-09-21 DIAGNOSIS — E861 Hypovolemia: Secondary | ICD-10-CM | POA: Diagnosis not present

## 2022-09-21 DIAGNOSIS — C7971 Secondary malignant neoplasm of right adrenal gland: Secondary | ICD-10-CM | POA: Diagnosis present

## 2022-09-21 DIAGNOSIS — Z87891 Personal history of nicotine dependence: Secondary | ICD-10-CM

## 2022-09-21 DIAGNOSIS — I824Z2 Acute embolism and thrombosis of unspecified deep veins of left distal lower extremity: Secondary | ICD-10-CM | POA: Diagnosis not present

## 2022-09-21 DIAGNOSIS — I420 Dilated cardiomyopathy: Secondary | ICD-10-CM | POA: Diagnosis present

## 2022-09-21 DIAGNOSIS — Z7982 Long term (current) use of aspirin: Secondary | ICD-10-CM

## 2022-09-21 DIAGNOSIS — I9589 Other hypotension: Secondary | ICD-10-CM | POA: Diagnosis not present

## 2022-09-21 DIAGNOSIS — G4733 Obstructive sleep apnea (adult) (pediatric): Secondary | ICD-10-CM

## 2022-09-21 DIAGNOSIS — Z8249 Family history of ischemic heart disease and other diseases of the circulatory system: Secondary | ICD-10-CM

## 2022-09-21 DIAGNOSIS — I82412 Acute embolism and thrombosis of left femoral vein: Secondary | ICD-10-CM | POA: Diagnosis present

## 2022-09-21 DIAGNOSIS — D696 Thrombocytopenia, unspecified: Secondary | ICD-10-CM | POA: Diagnosis present

## 2022-09-21 DIAGNOSIS — Z951 Presence of aortocoronary bypass graft: Secondary | ICD-10-CM

## 2022-09-21 DIAGNOSIS — Z7952 Long term (current) use of systemic steroids: Secondary | ICD-10-CM

## 2022-09-21 DIAGNOSIS — J9811 Atelectasis: Secondary | ICD-10-CM | POA: Diagnosis not present

## 2022-09-21 DIAGNOSIS — R2681 Unsteadiness on feet: Secondary | ICD-10-CM | POA: Diagnosis not present

## 2022-09-21 DIAGNOSIS — Z91199 Patient's noncompliance with other medical treatment and regimen due to unspecified reason: Secondary | ICD-10-CM

## 2022-09-21 DIAGNOSIS — I6782 Cerebral ischemia: Secondary | ICD-10-CM | POA: Diagnosis not present

## 2022-09-21 DIAGNOSIS — R000 Tachycardia, unspecified: Secondary | ICD-10-CM | POA: Diagnosis not present

## 2022-09-21 LAB — LACTIC ACID, PLASMA
Lactic Acid, Venous: 0.8 mmol/L (ref 0.5–1.9)
Lactic Acid, Venous: 1.1 mmol/L (ref 0.5–1.9)

## 2022-09-21 LAB — CBC WITH DIFFERENTIAL/PLATELET
Abs Immature Granulocytes: 0.04 10*3/uL (ref 0.00–0.07)
Basophils Absolute: 0 10*3/uL (ref 0.0–0.1)
Basophils Relative: 0 %
Eosinophils Absolute: 0 10*3/uL (ref 0.0–0.5)
Eosinophils Relative: 0 %
HCT: 36.4 % — ABNORMAL LOW (ref 39.0–52.0)
Hemoglobin: 11.6 g/dL — ABNORMAL LOW (ref 13.0–17.0)
Immature Granulocytes: 1 %
Lymphocytes Relative: 4 %
Lymphs Abs: 0.2 10*3/uL — ABNORMAL LOW (ref 0.7–4.0)
MCH: 32.5 pg (ref 26.0–34.0)
MCHC: 31.9 g/dL (ref 30.0–36.0)
MCV: 102 fL — ABNORMAL HIGH (ref 80.0–100.0)
Monocytes Absolute: 0.3 10*3/uL (ref 0.1–1.0)
Monocytes Relative: 6 %
Neutro Abs: 4.8 10*3/uL (ref 1.7–7.7)
Neutrophils Relative %: 89 %
Platelets: 113 10*3/uL — ABNORMAL LOW (ref 150–400)
RBC: 3.57 MIL/uL — ABNORMAL LOW (ref 4.22–5.81)
RDW: 15.1 % (ref 11.5–15.5)
WBC: 5.4 10*3/uL (ref 4.0–10.5)
nRBC: 0 % (ref 0.0–0.2)

## 2022-09-21 LAB — I-STAT VENOUS BLOOD GAS, ED
Acid-Base Excess: 2 mmol/L (ref 0.0–2.0)
Bicarbonate: 26.9 mmol/L (ref 20.0–28.0)
Calcium, Ion: 1.03 mmol/L — ABNORMAL LOW (ref 1.15–1.40)
HCT: 34 % — ABNORMAL LOW (ref 39.0–52.0)
Hemoglobin: 11.6 g/dL — ABNORMAL LOW (ref 13.0–17.0)
O2 Saturation: 46 %
Potassium: 6.1 mmol/L — ABNORMAL HIGH (ref 3.5–5.1)
Sodium: 138 mmol/L (ref 135–145)
TCO2: 28 mmol/L (ref 22–32)
pCO2, Ven: 43.2 mmHg — ABNORMAL LOW (ref 44–60)
pH, Ven: 7.402 (ref 7.25–7.43)
pO2, Ven: 25 mmHg — CL (ref 32–45)

## 2022-09-21 LAB — HEPATIC FUNCTION PANEL
ALT: 15 U/L (ref 0–44)
AST: 26 U/L (ref 15–41)
Albumin: 3.1 g/dL — ABNORMAL LOW (ref 3.5–5.0)
Alkaline Phosphatase: 43 U/L (ref 38–126)
Bilirubin, Direct: 0.3 mg/dL — ABNORMAL HIGH (ref 0.0–0.2)
Indirect Bilirubin: 1 mg/dL — ABNORMAL HIGH (ref 0.3–0.9)
Total Bilirubin: 1.3 mg/dL — ABNORMAL HIGH (ref 0.3–1.2)
Total Protein: 5.6 g/dL — ABNORMAL LOW (ref 6.5–8.1)

## 2022-09-21 LAB — RETICULOCYTES
Immature Retic Fract: 7.7 % (ref 2.3–15.9)
RBC.: 3.37 MIL/uL — ABNORMAL LOW (ref 4.22–5.81)
Retic Count, Absolute: 58 10*3/uL (ref 19.0–186.0)
Retic Ct Pct: 1.7 % (ref 0.4–3.1)

## 2022-09-21 LAB — RESP PANEL BY RT-PCR (RSV, FLU A&B, COVID)  RVPGX2
Influenza A by PCR: NEGATIVE
Influenza B by PCR: NEGATIVE
Resp Syncytial Virus by PCR: NEGATIVE
SARS Coronavirus 2 by RT PCR: NEGATIVE

## 2022-09-21 LAB — BASIC METABOLIC PANEL
Anion gap: 10 (ref 5–15)
BUN: 27 mg/dL — ABNORMAL HIGH (ref 8–23)
CO2: 24 mmol/L (ref 22–32)
Calcium: 8.1 mg/dL — ABNORMAL LOW (ref 8.9–10.3)
Chloride: 105 mmol/L (ref 98–111)
Creatinine, Ser: 1.43 mg/dL — ABNORMAL HIGH (ref 0.61–1.24)
GFR, Estimated: 51 mL/min — ABNORMAL LOW (ref >60)
Glucose, Bld: 122 mg/dL — ABNORMAL HIGH (ref 70–99)
Potassium: 3.4 mmol/L — ABNORMAL LOW (ref 3.5–5.1)
Sodium: 139 mmol/L (ref 135–145)

## 2022-09-21 LAB — FOLATE: Folate: >40 ng/mL (ref >5.9)

## 2022-09-21 LAB — TROPONIN I (HIGH SENSITIVITY)
Troponin I (High Sensitivity): 89 ng/L — ABNORMAL HIGH (ref <18)
Troponin I (High Sensitivity): 94 ng/L — ABNORMAL HIGH (ref <18)
Troponin I (High Sensitivity): 83 ng/L — ABNORMAL HIGH (ref <18)
Troponin I (High Sensitivity): 83 ng/L — ABNORMAL HIGH (ref <18)

## 2022-09-21 LAB — VITAMIN B12: Vitamin B-12: 916 pg/mL — ABNORMAL HIGH (ref 180–914)

## 2022-09-21 LAB — HEPARIN LEVEL (UNFRACTIONATED): Heparin Unfractionated: >1.1 IU/mL — ABNORMAL HIGH (ref 0.30–0.70)

## 2022-09-21 LAB — TSH: TSH: 1.238 u[IU]/mL (ref 0.350–4.500)

## 2022-09-21 LAB — I-STAT CREATININE, ED: Creatinine, Ser: 1.5 mg/dL — ABNORMAL HIGH (ref 0.61–1.24)

## 2022-09-21 LAB — MAGNESIUM: Magnesium: 2.5 mg/dL — ABNORMAL HIGH (ref 1.7–2.4)

## 2022-09-21 LAB — PHOSPHORUS: Phosphorus: 2.7 mg/dL (ref 2.5–4.6)

## 2022-09-21 LAB — IRON AND TIBC
Iron: 48 ug/dL (ref 45–182)
Saturation Ratios: 19 % (ref 17.9–39.5)
TIBC: 251 ug/dL (ref 250–450)
UIBC: 203 ug/dL

## 2022-09-21 LAB — OSMOLALITY: Osmolality: 297 mOsm/kg — ABNORMAL HIGH (ref 275–295)

## 2022-09-21 LAB — CK: Total CK: 54 U/L (ref 49–397)

## 2022-09-21 LAB — PROTIME-INR
INR: 1.8 — ABNORMAL HIGH (ref 0.8–1.2)
Prothrombin Time: 20.4 seconds — ABNORMAL HIGH (ref 11.4–15.2)

## 2022-09-21 LAB — BRAIN NATRIURETIC PEPTIDE: B Natriuretic Peptide: 543.4 pg/mL — ABNORMAL HIGH (ref 0.0–100.0)

## 2022-09-21 LAB — FERRITIN: Ferritin: 981 ng/mL — ABNORMAL HIGH (ref 24–336)

## 2022-09-21 LAB — APTT: aPTT: 44 seconds — ABNORMAL HIGH (ref 24–36)

## 2022-09-21 MED ORDER — PANTOPRAZOLE SODIUM 40 MG PO TBEC
40.0000 mg | DELAYED_RELEASE_TABLET | Freq: Every day | ORAL | Status: DC
  Administered 2022-09-21 – 2022-09-25 (×5): 40 mg via ORAL
  Filled 2022-09-21 (×5): qty 1

## 2022-09-21 MED ORDER — TRAMADOL HCL 50 MG PO TABS: 50.0000 mg | ORAL_TABLET | Freq: Four times a day (QID) | ORAL | Status: DC | PRN

## 2022-09-21 MED ORDER — LEVOTHYROXINE SODIUM 50 MCG PO TABS
50.0000 ug | ORAL_TABLET | Freq: Every day | ORAL | Status: DC
  Administered 2022-09-22 – 2022-09-25 (×4): 50 ug via ORAL
  Filled 2022-09-21 (×4): qty 1

## 2022-09-21 MED ORDER — DILTIAZEM LOAD VIA INFUSION
5.0000 mg | Freq: Once | INTRAVENOUS | Status: AC
  Administered 2022-09-21: 5 mg via INTRAVENOUS
  Filled 2022-09-21: qty 5

## 2022-09-21 MED ORDER — ASPIRIN 81 MG PO CHEW
81.0000 mg | CHEWABLE_TABLET | Freq: Every day | ORAL | Status: DC
  Administered 2022-09-21: 81 mg via ORAL
  Filled 2022-09-21 (×2): qty 1

## 2022-09-21 MED ORDER — ACETAMINOPHEN 650 MG RE SUPP: 650.0000 mg | Freq: Four times a day (QID) | RECTAL | Status: DC | PRN

## 2022-09-21 MED ORDER — SODIUM CHLORIDE 0.9 % IV SOLN: INTRAVENOUS | Status: DC

## 2022-09-21 MED ORDER — DILTIAZEM HCL-DEXTROSE 125-5 MG/125ML-% IV SOLN (PREMIX)
5.0000 mg/h | INTRAVENOUS | Status: DC
  Administered 2022-09-21: 5 mg/h via INTRAVENOUS
  Filled 2022-09-21: qty 125

## 2022-09-21 MED ORDER — HEPARIN BOLUS VIA INFUSION
5000.0000 [IU] | Freq: Once | INTRAVENOUS | Status: AC
  Administered 2022-09-21: 5000 [IU] via INTRAVENOUS
  Filled 2022-09-21: qty 5000

## 2022-09-21 MED ORDER — HYDROCORTISONE SOD SUC (PF) 100 MG IJ SOLR
100.0000 mg | Freq: Three times a day (TID) | INTRAMUSCULAR | Status: DC
  Administered 2022-09-21 – 2022-09-23 (×6): 100 mg via INTRAVENOUS
  Filled 2022-09-21 (×7): qty 2

## 2022-09-21 MED ORDER — POTASSIUM CHLORIDE CRYS ER 20 MEQ PO TBCR
20.0000 meq | EXTENDED_RELEASE_TABLET | Freq: Once | ORAL | Status: AC
  Administered 2022-09-21: 20 meq via ORAL
  Filled 2022-09-21: qty 1

## 2022-09-21 MED ORDER — ASPIRIN 81 MG PO CHEW
81.0000 mg | CHEWABLE_TABLET | Freq: Every day | ORAL | Status: DC
  Administered 2022-09-22 – 2022-09-24 (×3): 81 mg via ORAL
  Filled 2022-09-21 (×3): qty 1

## 2022-09-21 MED ORDER — SODIUM CHLORIDE 0.9 % IV BOLUS
1000.0000 mL | Freq: Once | INTRAVENOUS | Status: AC
  Administered 2022-09-21: 1000 mL via INTRAVENOUS

## 2022-09-21 MED ORDER — SERTRALINE HCL 100 MG PO TABS
100.0000 mg | ORAL_TABLET | Freq: Two times a day (BID) | ORAL | Status: DC
  Administered 2022-09-21 – 2022-09-25 (×8): 100 mg via ORAL
  Filled 2022-09-21 (×8): qty 1

## 2022-09-21 MED ORDER — IOHEXOL 350 MG/ML SOLN
75.0000 mL | Freq: Once | INTRAVENOUS | Status: AC | PRN
  Administered 2022-09-21: 75 mL via INTRAVENOUS

## 2022-09-21 MED ORDER — ACETAMINOPHEN 325 MG PO TABS
650.0000 mg | ORAL_TABLET | Freq: Four times a day (QID) | ORAL | Status: DC | PRN
  Administered 2022-09-21: 650 mg via ORAL
  Filled 2022-09-21: qty 2

## 2022-09-21 MED ORDER — ROSUVASTATIN CALCIUM 20 MG PO TABS
40.0000 mg | ORAL_TABLET | Freq: Every day | ORAL | Status: DC
  Administered 2022-09-21 – 2022-09-24 (×4): 40 mg via ORAL
  Filled 2022-09-21 (×4): qty 2

## 2022-09-21 MED ORDER — HEPARIN (PORCINE) 25000 UT/250ML-% IV SOLN
1350.0000 [IU]/h | INTRAVENOUS | Status: DC
  Administered 2022-09-21: 1350 [IU]/h via INTRAVENOUS
  Filled 2022-09-21: qty 250

## 2022-09-21 NOTE — Assessment & Plan Note (Addendum)
Has been undergoing workup for possible paraneoplastic syndrome have had recent MRI brain and spine for metastatic screening. Discussed with Neurology most likely cause his immunosuppressive medications continue steroids address medical issues and see if pt exercise tolerance improves   Will need to continue follow-up as an outpatient with neurology/oncology

## 2022-09-21 NOTE — Assessment & Plan Note (Signed)
Continue aspirin  Toprol and Crestor

## 2022-09-21 NOTE — Subjective & Objective (Signed)
Patient presents from home with progressive shortness of breath for the past 2 weeks patient has known history of PEs there is a history of lung cancer on anticoagulation with Eliquis also has history of A-fib on metoprolol Has been somewhat more unsteady on his feet for past few days On arrival noted to be in mid 70s on room air and was put on 2 L also noted to be tachycardic up to 135 and A-fib with RVR

## 2022-09-21 NOTE — Assessment & Plan Note (Signed)
Repleat and check electrolytes Replete magnesium and phosphate as needed recheck in a.m.

## 2022-09-21 NOTE — Assessment & Plan Note (Signed)
Current appears to be on a dry side initially hypotensive responded well to IV fluids.  Continue to gently rehydrate Given AKI hold Entresto

## 2022-09-21 NOTE — Assessment & Plan Note (Addendum)
Obtain electrolytes Rehydrate

## 2022-09-21 NOTE — ED Notes (Signed)
Patient placed onto cardiac monitor. PA at bedside at this time.

## 2022-09-21 NOTE — Assessment & Plan Note (Signed)
Ct head negative

## 2022-09-21 NOTE — Assessment & Plan Note (Signed)
Will notify oncology patient is being admitted

## 2022-09-21 NOTE — Assessment & Plan Note (Signed)
Secondary to PE noted to be hypoxic down to 70% room air currently on 2 L satting mid 90s.  Provide oxygen as needed

## 2022-09-21 NOTE — Assessment & Plan Note (Signed)
Hold home bp  medications Rehydrate Stress dose steroids

## 2022-09-21 NOTE — Assessment & Plan Note (Signed)
Continue synthroid at 50 mcg a day

## 2022-09-21 NOTE — Assessment & Plan Note (Signed)
Hold diltiazem for now given hypotension  Now on heparin Hold metoprolol given hypotension

## 2022-09-21 NOTE — Assessment & Plan Note (Signed)
Continue Crestor at 40 mg a day

## 2022-09-21 NOTE — ED Notes (Signed)
ED TO INPATIENT HANDOFF REPORT  ED Nurse Name and Phone #: Mechele Claude, [phone]  S Name/Age/Gender John Hinton 77 y.o. male Room/Bed: 001C/001C  Code Status   Code Status: Prior  Home/SNF/Other Home Patient oriented to: self and place Is this baseline? No   Triage Complete: Triage complete  Chief Complaint Pulmonary embolism [facility]) [I26.99]  Triage Note Pt to ED via EMS from home c/o [facility], of note pt has hx of PEs and recent airplane travel. Pt also has hx of lung ca, currently undergoing treatment. Pt noted to be in AFIB with EMS. Pt wife also concerned that he has been more"unsteady on feet" x 2 days than his normal. Last VS: 150/123, 98%2L, HR 135. #18LAC, no medications given by EMS.    Allergies Allergies  Allergen Reactions   Amlodipine Swelling    Swelling of ankles    Level of Care/Admitting Diagnosis ED Disposition     ED Disposition  Admit   Condition  --   Comment  Hospital Area: [place] [[id number]]  Level of Care: Progressive [102]  Admit to Progressive based on following criteria: CARDIOVASCULAR & THORACIC of moderate stability with acute coronary syndrome symptoms/low risk myocardial infarction/hypertensive urgency/arrhythmias/heart failure potentially compromising stability and stable post cardiovascular intervention patients.  May place patient in observation at [facility] or [place] if equivalent level of care is available:: No  Covid Evaluation: Confirmed COVID Negative  Diagnosis: Pulmonary embolism ([place]) [id number]  Admitting Physician: Toy Baker [[id number]]  Attending Physician: Toy Baker [[id number]]          B Medical/Surgery History Past Medical History:  Diagnosis Date   BPH (benign prostatic hypertrophy)    Bradycardia    a. H/o asymptomatic bradycardia.   Coronary artery disease    a. s/p CABG ~2007 with LIMA to LAD, SVG seq to OM1 and OM2, SVG to PDA and SVG to diag. b. Abnormal nuc 03/2015 - s/p  DES to SVG-OM1-OM2. 01/29/17 POBA with cutting balloon to SVG-->OM.  No ischemia Lexi myoview 2021   DCM (dilated cardiomyopathy) ([place]) 09/17/2015   mixed by cMRI 03/2021 with EF 39% with non viable infarct in basal to mid lateral wall and LGE pattern c/w nonischemic component as well. Mild RV dysfunction RVEF 42%.   Dyslipidemia    Dyspnea    r/t lung mass   ED (erectile dysfunction)    Fear of heights    Goals of care, counseling/discussion 06/19/2020   History of radiation therapy    T spine and left ilium 10/07/2021-10/20/2021  Dr Gery Pray   Hypertension    Non-small cell carcinoma of lung, stage 4, left ([place]) 06/19/2020   OSA (obstructive sleep apnea) 07/12/2015   Moderate OSA with AHI 16.5/hr now on CPAP at 8cm H2O, uses cpap nightly      RBBB    Shoulder, capsulitis, adhesive    Left Shoulder   Past Surgical History:  Procedure Laterality Date   APPENDECTOMY     BRONCHIAL BIOPSY  05/09/2020   Procedure: BRONCHIAL BIOPSIES;  Surgeon: [name], DO;  Location: [place] [facility];  Service: Pulmonary;;   BRONCHIAL BRUSHINGS  05/09/2020   Procedure: BRONCHIAL BRUSHINGS;  Surgeon: [name], DO;  Location: [place] [facility];  Service: Pulmonary;;   BRONCHIAL NEEDLE ASPIRATION BIOPSY  05/09/2020   Procedure: BRONCHIAL NEEDLE ASPIRATION BIOPSIES;  Surgeon: [name], DO;  Location: [place] [facility];  Service: Pulmonary;;   BRONCHIAL WASHINGS  05/09/2020   Procedure: BRONCHIAL WASHINGS;  Surgeon: Valeta Harms,  Octavio Graves, DO;  Location: [place] [facility];  Service: Pulmonary;;   CARDIAC CATHETERIZATION N/A 04/05/2015   Procedure: Left Heart Cath and Coronary Angiography;  Surgeon: Sherren Mocha, MD;  Location: [place] [facility];  Service: Cardiovascular;  Laterality: N/A;   COLONOSCOPY     CORONARY ARTERY BYPASS GRAFT     w/LIMA to LAD, seq SVG to OM1 and OM2, SVG to PDA and SVG to Diagonal   CORONARY BALLOON ANGIOPLASTY N/A 01/29/2017   Procedure: Coronary Balloon Angioplasty;  Surgeon:  Martinique, Peter M, MD;  Location: [place] [facility];  Service: Cardiovascular;  Laterality: N/A;   FIDUCIAL MARKER PLACEMENT  05/09/2020   Procedure: FIDUCIAL MARKER PLACEMENT;  Surgeon: [name], DO;  Location: [place] [facility];  Service: Pulmonary;;   INTERCOSTAL NERVE BLOCK Left 05/22/2020   Procedure: INTERCOSTAL NERVE BLOCK;  Surgeon: Lajuana Matte, MD;  Location: [place];  Service: Thoracic;  Laterality: Left;   LEFT HEART CATH AND CORS/GRAFTS ANGIOGRAPHY N/A 01/29/2017   Procedure: Left Heart Cath and Cors/Grafts Angiography;  Surgeon: Martinique, Peter M, MD;  Location: [place] [facility];  Service: Cardiovascular;  Laterality: N/A;   NODE DISSECTION N/A 05/22/2020   Procedure: NODE DISSECTION;  Surgeon: Lajuana Matte, MD;  Location: [place];  Service: Thoracic;  Laterality: N/A;   VIDEO BRONCHOSCOPY WITH ENDOBRONCHIAL NAVIGATION N/A 05/09/2020   Procedure: VIDEO BRONCHOSCOPY WITH ENDOBRONCHIAL NAVIGATION;  Surgeon: [name], DO;  Location: [place];  Service: Pulmonary;  Laterality: N/A;   VIDEO BRONCHOSCOPY WITH ENDOBRONCHIAL ULTRASOUND N/A 05/09/2020   Procedure: VIDEO BRONCHOSCOPY WITH ENDOBRONCHIAL ULTRASOUND;  Surgeon: [name], DO;  Location: [place];  Service: Pulmonary;  Laterality: N/A;     A IV Location/Drains/Wounds Patient Lines/Drains/Airways Status     Active Line/Drains/Airways     Name Placement date Placement time Site Days   Peripheral IV 09/21/22 18 G Left Antecubital 09/21/22  1318  Antecubital  less than 1   Peripheral IV 09/21/22 18 G Right Antecubital 09/21/22  1415  Antecubital  less than 1            Intake/Output Last 24 hours No intake or output data in the 24 hours ending 09/21/22 1916  Labs/Imaging Results for orders placed or performed during the hospital encounter of 09/21/22 (from the past 48 hour(s))  Resp panel by RT-PCR (RSV, Flu A&B, Covid) Anterior Nasal Swab     Status: None   Collection Time: 09/21/22   1:36 PM   Specimen: Anterior Nasal Swab  Result Value Ref Range   SARS Coronavirus 2 by RT PCR NEGATIVE NEGATIVE   Influenza A by PCR NEGATIVE NEGATIVE   Influenza B by PCR NEGATIVE NEGATIVE    Comment: (NOTE) The Xpert Xpress SARS-CoV-2/FLU/RSV plus assay is intended as an aid in the diagnosis of influenza from Nasopharyngeal swab specimens and should not be used as a sole basis for treatment. Nasal washings and aspirates are unacceptable for Xpert Xpress SARS-CoV-2/FLU/RSV testing.  Fact Sheet for Patients: EntrepreneurPulse.com.au  Fact Sheet for Healthcare Providers: IncredibleEmployment.be  This test is not yet approved or cleared by the [place] FDA and has been authorized for detection and/or diagnosis of SARS-CoV-2 by FDA under an Emergency Use Authorization (EUA). This EUA will remain in effect (meaning this test can be used) for the duration of the COVID-19 declaration under Section 564(b)(1) of the Act, 21 U.S.C. section 360bbb-3(b)(1), unless the authorization is terminated or revoked.     Resp Syncytial Virus by PCR  NEGATIVE NEGATIVE    Comment: (NOTE) Fact Sheet for Patients: EntrepreneurPulse.com.au  Fact Sheet for Healthcare Providers: IncredibleEmployment.be  This test is not yet approved or cleared by the [place] FDA and has been authorized for detection and/or diagnosis of SARS-CoV-2 by FDA under an Emergency Use Authorization (EUA). This EUA will remain in effect (meaning this test can be used) for the duration of the COVID-19 declaration under Section 564(b)(1) of the Act, 21 U.S.C. section 360bbb-3(b)(1), unless the authorization is terminated or revoked.  Performed at [facility], [place] [address] [place], [place] [address]   CBC with Differential     Status: Abnormal   Collection Time: 09/21/22  1:48 PM  Result Value Ref Range   WBC 5.4 4.0 - 10.5 K/uL    RBC 3.57 (L) 4.22 - 5.81 MIL/uL   Hemoglobin 11.6 (L) 13.0 - 17.0 g/dL   HCT 36.4 (L) 39.0 - 52.0 %   MCV 102.0 (H) 80.0 - 100.0 fL   MCH 32.5 26.0 - 34.0 pg   MCHC 31.9 30.0 - 36.0 g/dL   RDW 15.1 11.5 - 15.5 %   Platelets 113 (L) 150 - 400 K/uL   nRBC 0.0 0.0 - 0.2 %   Neutrophils Relative % 89 %   Neutro Abs 4.8 1.7 - 7.7 K/uL   Lymphocytes Relative 4 %   Lymphs Abs 0.2 (L) 0.7 - 4.0 K/uL   Monocytes Relative 6 %   Monocytes Absolute 0.3 0.1 - 1.0 K/uL   Eosinophils Relative 0 %   Eosinophils Absolute 0.0 0.0 - 0.5 K/uL   Basophils Relative 0 %   Basophils Absolute 0.0 0.0 - 0.1 K/uL   Immature Granulocytes 1 %   Abs Immature Granulocytes 0.04 0.00 - 0.07 K/uL    Comment: Performed at [facility], [place] [address], [place], [place] [address]  I-Stat venous blood gas, [facility], [facility], [facility])     Status: Abnormal   Collection Time: 09/21/22  2:02 PM  Result Value Ref Range   pH, Ven 7.402 7.25 - 7.43   pCO2, Ven 43.2 (L) 44 - 60 mmHg   pO2, Ven 25 (LL) 32 - 45 mmHg   Bicarbonate 26.9 20.0 - 28.0 mmol/L   TCO2 28 22 - 32 mmol/L   O2 Saturation 46 %   Acid-Base Excess 2.0 0.0 - 2.0 mmol/L   Sodium 138 135 - 145 mmol/L   Potassium 6.1 (H) 3.5 - 5.1 mmol/L   Calcium, Ion 1.03 (L) 1.15 - 1.40 mmol/L   HCT 34.0 (L) 39.0 - 52.0 %   Hemoglobin 11.6 (L) 13.0 - 17.0 g/dL   Sample type VENOUS    Comment NOTIFIED PHYSICIAN   I-Stat Creatinine, ED (not at [facility] or [facility])     Status: Abnormal   Collection Time: 09/21/22  2:02 PM  Result Value Ref Range   Creatinine, Ser 1.50 (H) 0.61 - 1.24 mg/dL  Basic metabolic panel     Status: Abnormal   Collection Time: 09/21/22  2:57 PM  Result Value Ref Range   Sodium 139 135 - 145 mmol/L   Potassium 3.4 (L) 3.5 - 5.1 mmol/L   Chloride 105 98 - 111 mmol/L   CO2 24 22 - 32 mmol/L   Glucose, Bld 122 (H) 70 - 99 mg/dL    Comment: Glucose reference range applies only to samples taken after fasting for at least 8 hours.   BUN 27 (H) 8 - 23 mg/dL  Creatinine, Ser 1.43 (H) 0.61 - 1.24 mg/dL   Calcium 8.1 (L) 8.9 - 10.3 mg/dL   GFR, Estimated 51 (L) >60 mL/min    Comment: (NOTE) Calculated using the CKD-EPI Creatinine Equation (2021)    Anion gap 10 5 - 15    Comment: Performed at [facility] [address], [place], [place] [address]  Troponin I (High Sensitivity)     Status: Abnormal   Collection Time: 09/21/22  3:39 PM  Result Value Ref Range   Troponin I (High Sensitivity) 89 (H) <18 ng/L    Comment: (NOTE) Elevated high sensitivity troponin I (hsTnI) values and significant  changes across serial measurements may suggest ACS but many other  chronic and acute conditions are known to elevate hsTnI results.  Refer to the "Links" section for chest pain algorithms and additional  guidance. Performed at [facility], [place] [address] [place], [place] [address]   Troponin I (High Sensitivity)     Status: Abnormal   Collection Time: 09/21/22  5:51 PM  Result Value Ref Range   Troponin I (High Sensitivity) 94 (H) <18 ng/L    Comment: (NOTE) Elevated high sensitivity troponin I (hsTnI) values and significant  changes across serial measurements may suggest ACS but many other  chronic and acute conditions are known to elevate hsTnI results.  Refer to the "Links" section for chest pain algorithms and additional  guidance. Performed at [facility], [place] [address] [place], [place] [address]    CT Angio Chest PE W and/or Wo Contrast  Result Date: 09/21/2022 CLINICAL DATA:  Shortness of breath. History of pulmonary embolism and lung cancer. EXAM: CT ANGIOGRAPHY CHEST WITH CONTRAST TECHNIQUE: Multidetector CT imaging of the chest was performed using the standard protocol during bolus administration of intravenous contrast. Multiplanar CT image reconstructions and MIPs were obtained to evaluate the vascular anatomy. RADIATION DOSE REDUCTION: This exam was performed according to the departmental dose-optimization  program which includes automated exposure control, adjustment of the mA and/or kV according to patient size and/or use of iterative reconstruction technique. CONTRAST:  54m OMNIPAQUE IOHEXOL 350 MG/ML SOLN COMPARISON:  PET-CT dated July 17, 2022. FINDINGS: Cardiovascular: Satisfactory opacification of the pulmonary arteries to the segmental level. Acute occlusive pulmonary emboli in the right interlobar pulmonary artery, right middle lobar and segmental pulmonary arteries, and right superior and basal segmental pulmonary arteries. Mild cardiomegaly with right heart enlargement. Elevated RV/LV ratio of 1.35. Prior CABG. No pericardial effusion. No thoracic aortic aneurysm or dissection. Coronary, aortic arch, and branch vessel atherosclerotic vascular disease. Mediastinum/Nodes: No enlarged mediastinal, hilar, or axillary lymph nodes. Unchanged calcified right hilar and subcarinal lymph nodes. Thyroid gland, trachea, and esophagus demonstrate no significant findings. Lungs/Pleura: Postsurgical changes from prior left upper lobectomy. Small subpleural opacity in the superior segment of the right upper lobe may reflect developing infarct or atelectasis. New mild right basilar subsegmental atelectasis. Unchanged calcified granuloma in the posterior right lower lobe. Unchanged small loculated left pleural effusion with significant irregular pleural thickening at the posterior costophrenic angle. Unchanged scarring in the posterior left lower lobe. Upper Abdomen: Enlarging left adrenal metastasis, currently 4.6 x 3.3 cm, previously nonmeasurable. Unchanged right adrenal metastasis. Musculoskeletal: Unchanged T1 and T10 sclerotic metastases. Review of the MIP images confirms the above findings. IMPRESSION: 1. Acute occlusive lobar and segmental pulmonary emboli in the right middle and lower lobes. Positive for acute PE with CT evidence of right heart strain (RV/LV Ratio = 1.35 ) consistent with  at least submassive  (intermediate risk) PE. The presence of right heart strain has been associated with an increased risk of morbidity and mortality. Please refer to the "PE Focused" order set in EPIC. 2. Small subpleural opacity in the superior segment of the right upper lobe may reflect developing infarct or atelectasis. 3. Unchanged small loculated left pleural effusion with significant irregular pleural thickening at the posterior costophrenic angle, concerning for malignancy. 4. Enlarging left adrenal metastasis, currently 4.6 x 3.3 cm, previously non-measurable. Unchanged right adrenal metastasis. 5. Unchanged T1 and T10 sclerotic metastases. 6.  Aortic atherosclerosis (ICD10-I70.0). Critical Value/emergent results were called by telephone at the time of interpretation on 09/21/2022 at 5:59 pm to provider DAN FLOYD, who verbally acknowledged these results. Electronically Signed   By: Titus Dubin M.D.   On: 09/21/2022 18:03   CT Head Wo Contrast  Result Date: 09/21/2022 CLINICAL DATA:  Gait instability.  History of lung cancer. EXAM: CT HEAD WITHOUT CONTRAST TECHNIQUE: Contiguous axial images were obtained from the base of the skull through the vertex without intravenous contrast. RADIATION DOSE REDUCTION: This exam was performed according to the departmental dose-optimization program which includes automated exposure control, adjustment of the mA and/or kV according to patient size and/or use of iterative reconstruction technique. COMPARISON:  CT head dated August 19, 2022 FINDINGS: Brain: No evidence of acute infarction, hemorrhage, hydrocephalus, extra-axial collection or mass lesion/mass effect. Low-attenuation of the periventricular white matter presumed chronic microvascular ischemic changes. Vascular: No hyperdense vessel or unexpected calcification. Skull: Normal. Negative for fracture or focal lesion. Sinuses/Orbits: Partial opacification of the left maxillary sinus. Remaining paranasal sinuses and mastoid air cells  are clear. Other: None. IMPRESSION: 1. No acute intracranial abnormality. 2. Chronic microvascular ischemic changes of the white matter. 3. Partial opacification of the left maxillary sinus. Electronically Signed   By: Keane Police D.O.   On: 09/21/2022 17:23   DG Chest Port 1 View  Result Date: 09/21/2022 CLINICAL DATA:  Shortness of breath.  New oxygen requirement. EXAM: PORTABLE CHEST 1 VIEW COMPARISON:  09/08/2022. FINDINGS: 1343 hours. Unchanged small left pleural effusion and chronic volume loss in the left hemithorax. Unchanged nodular opacity in the right mid lung. No new airspace opacity. Stable cardiac and mediastinal contours. No pneumothorax. IMPRESSION: Unchanged small left pleural effusion. Unchanged nodular opacity in the right mid lung. Electronically Signed   By: Emmit Alexanders M.D.   On: 09/21/2022 13:52    Pending Labs Unresulted Labs (From admission, onward)     Start     Ordered   09/22/22 0500  Prealbumin  Tomorrow morning,   R        09/21/22 1915   09/21/22 1916  CK  Add-on,   AD        09/21/22 1915   09/21/22 1916  Magnesium  Add-on,   AD        09/21/22 1915   09/21/22 1916  Phosphorus  Add-on,   AD        09/21/22 1915   09/21/22 1916  Osmolality  Add-on,   AD        09/21/22 1915   09/21/22 1916  Osmolality, urine  Once,   R        09/21/22 1915   09/21/22 1916  Creatinine, urine, random  Once,   R        09/21/22 1915   09/21/22 1916  TSH  Add-on,   AD        09/21/22 1915  09/21/22 1916  Urinalysis, Complete w Microscopic -Urine, Clean Catch  Once,   R       Question Answer Comment  Release to patient Immediate   Specimen Source Urine, Clean Catch      09/21/22 1915   09/21/22 1916  Sodium, urine, random  Once,   R        09/21/22 1915   09/21/22 1916  Hepatic function panel  Add-on,   AD       Question:  Release to patient  Answer:  Immediate   09/21/22 1915   09/21/22 1916  Vitamin B12  (Anemia Panel (PNL))  Once,   R        09/21/22 1915    09/21/22 1916  Folate  (Anemia Panel (PNL))  Once,   R        09/21/22 1915   09/21/22 1916  Iron and TIBC  (Anemia Panel (PNL))  Once,   R        09/21/22 1915   09/21/22 1916  Ferritin  (Anemia Panel (PNL))  Once,   R        09/21/22 1915   09/21/22 1916  Reticulocytes  (Anemia Panel (PNL))  Once,   R        09/21/22 1915   09/21/22 1843  APTT  Once,   STAT        09/21/22 1842   09/21/22 1843  Protime-INR  Once,   STAT        09/21/22 1842   09/21/22 1842  Brain natriuretic peptide  Once,   URGENT        09/21/22 1841   09/21/22 1842  Lactic acid, plasma  STAT Now then every 3 hours,   R      09/21/22 1841            Vitals/Pain Today's Vitals   09/21/22 1745 09/21/22 1800 09/21/22 1810 09/21/22 1845  BP: 97/64 111/64  104/61  Pulse: 85 93  (!) 107  Resp: (!) 21   (!) 25  Temp:      TempSrc:      SpO2: 98% 97%  98%  Weight:      Height:      PainSc:   0-No pain     Isolation Precautions No active isolations  Medications Medications  diltiazem (CARDIZEM) 1 mg/mL load via infusion 5 mg (5 mg Intravenous Bolus from Bag 09/21/22 1743)    And  diltiazem (CARDIZEM) 125 mg in dextrose 5% 125 mL (1 mg/mL) infusion (5 mg/hr Intravenous New Bag/Given 09/21/22 1742)  sodium chloride 0.9 % bolus 1,000 mL (1,000 mLs Intravenous New Bag/Given 09/21/22 1507)  iohexol (OMNIPAQUE) 350 MG/ML injection 75 mL (75 mLs Intravenous Contrast Given 09/21/22 1725)    Mobility walks with person assist     Focused Assessments Neuro Assessment Handoff:  Swallow screen pass? Yes          Neuro Assessment:   Neuro Checks:      Has TPA been given? No If patient is a Neuro Trauma and patient is going to OR before floor call report to [place] nurse: [phone] or [phone]   R Recommendations: See Admitting Provider Note  Report given to:   Additional Notes: pt is on room air.

## 2022-09-21 NOTE — H&P (Signed)
John Hinton [id number] DOB: [birth year] DOA: 09/21/2022     PCP: Corliss Blacker, MD   Outpatient Specialists:   CARDS:  Fredna Dow, MD    Oncology   Dr. Marin Olp   CT surgery Dr. Kipp Brood  Patient arrived to ER on 09/21/22 at 1309 Referred by Attending Toy Baker, MD   Patient coming from:    home Lives With family    Chief Complaint:   Chief Complaint  Patient presents with   Shortness of Breath    HPI: John Hinton is a 77 y.o. male with medical history significant of lung cancer, A-fib on close, PE, systolic CHF EF [id number], gout, hypothyroidism, GERD, paraneoplastic syndrome, CAD, left pleural effusion     Presented with   worsening shortness of breath Patient presents from home with progressive shortness of breath for the past 2 weeks patient has known history of PEs there is a history of lung cancer on anticoagulation with Eliquis also has history of A-fib on metoprolol Has been somewhat more unsteady on his feet for past few days On arrival noted to be in mid 70s on room air and was put on 2 L also noted to be tachycardic up to 135 and A-fib with RVR Patient has history of neurological issues  He has undergone workup for carcinomatosis meningitis including lumbar puncture no evidence of malignancy on his brain MRI was noted neurology have seen him in the past and felt that he has paraneoplastic syndrome versus side effects of his immunotherapy he has been taking prednisone for this .  Family patient has been compliant to his medications and have not had any bleeding Decreased appetite No etoh for the past few month Not since Christmas  No tobacco for past 30 years He has a fall this AM  He bumped his head     Initial COVID TEST  NEGATIVE   Lab Results  Component Value Date   SARSCOV2NAA NEGATIVE 09/21/2022   [place] NEGATIVE 05/04/2021   [place] Detected (A) 08/01/2020   [place] NEGATIVE 05/20/2020     Regarding  pertinent Chronic problems:     Hyperlipidemia - on statins Crestor on Zetia Lipid Panel     Component Value Date/Time   CHOL 151 03/03/2022 1155   TRIG 87 03/03/2022 1155   HDL 58 03/03/2022 1155   CHOLHDL 2.6 03/03/2022 1155   CHOLHDL 2.2 03/26/2016 1009   VLDL 14 03/26/2016 1009   LDLCALC 77 03/03/2022 1155   LABVLDL 16 03/03/2022 1155       chronic CHF diastolic/systolic/ combined - last echo January 2024 EF 50% On Entresto    CAD  - On Aspirin, statin, betablocker, Plavix                 -  followed by cardiology                - last cardiac cath   s/p CABG ~2007 with LIMA to LAD, SVG seq to OM1 and OM2, SVG to PDA and SVG to diag. b. Abnormal nuc 03/2015 - s/p DES to SVG-OM1-OM2. 01/29/17 POBA with cutting balloon to SVG-->OM.  No ischemia Lexi myoview 2021      Hypothyroidism:  Lab Results  Component Value Date   TSH 2.980 09/08/2022   on synthroid       OSA -on nocturnal oxygen, *CPAP, *noncompliant with CPAP    A. Fib -  - CHA2DS2 vas score    5    current  on  anticoagulation with Eliquis,         -  Rate control:  Currently controlled with  Metoprolol         Hx of DVT/PE on - anticoagulation with  Eliquis,      Chronic anemia - baseline hg Hemoglobin & Hematocrit  Recent Labs    09/08/22 0931 09/21/22 1348 09/21/22 1402  HGB 12.7* 11.6* 11.6*     While in ER:   CTA showed evidence of new PE EKG showing A-fib with RVR started on Cardizem drip by CCM was consulted as patient initially hypotensive in the 80s after IV fluids blood pressure improved patient continues to be stable for admission to progressive CT head negative   Ordered  CT HEAD   NON acute Chronic microvascular ischemic changes of the white matte   CXR - Unchanged small left pleural effusion. Unchanged nodular opacity in the right mid lung.    CTA chest - Acute occlusive lobar and segmental pulmonary emboli in the right middle and lower lobes. Positive for acute PE with CT evidence  of right heart strain (RV/LV Ratio = 1.35 ) consistent with at least submassive (intermediate risk) PE. The presence of right heart strain has been associated with an increased risk of morbidity and mortality. Please refer to the "PE Focused" order set in EPIC. 2. Small subpleural opacity in the superior segment of the right upper lobe may reflect developing infarct or atelectasis. 3. Unchanged small loculated left pleural effusion with significant irregular pleural thickening at the posterior costophrenic angle, concerning for malignancy.  Following Medications were ordered in ER: Medications  diltiazem (CARDIZEM) 1 mg/mL load via infusion 5 mg (5 mg Intravenous Bolus from Bag 09/21/22 1743)    And  diltiazem (CARDIZEM) 125 mg in dextrose 5% 125 mL (1 mg/mL) infusion (5 mg/hr Intravenous New Bag/Given 09/21/22 1742)  sodium chloride 0.9 % bolus 1,000 mL (1,000 mLs Intravenous New Bag/Given 09/21/22 1507)  iohexol (OMNIPAQUE) 350 MG/ML injection 75 mL (75 mLs Intravenous Contrast Given 09/21/22 1725)       ED Triage Vitals  Enc Vitals Group     BP 09/21/22 1316 (!) 144/78     Pulse Rate 09/21/22 1316 84     Resp 09/21/22 1316 18     Temp 09/21/22 1316 98.7 F (37.1 C)     Temp Source 09/21/22 1316 Oral     SpO2 09/21/22 1316 96 %     Weight 09/21/22 1314 185 lb (83.9 kg)     Height 09/21/22 1314 '5\' 11"'$  (1.803 m)     Head Circumference --      Peak Flow --      Pain Score 09/21/22 1314 0     Pain Loc --      Pain Edu? --      Excl. in [place]? --   TMAX(24)@     _________________________________________ Significant initial  Findings: Abnormal Labs Reviewed  CBC WITH DIFFERENTIAL/PLATELET - Abnormal; Notable for the following components:      Result Value   RBC 3.57 (*)    Hemoglobin 11.6 (*)    HCT 36.4 (*)    MCV 102.0 (*)    Platelets 113 (*)    Lymphs Abs 0.2 (*)    All other components within normal limits  BASIC METABOLIC PANEL - Abnormal; Notable for the following  components:   Potassium 3.4 (*)    Glucose, Bld 122 (*)    BUN 27 (*)    Creatinine, Ser 1.43 (*)  Calcium 8.1 (*)    GFR, Estimated 51 (*)    All other components within normal limits  I-STAT VENOUS BLOOD GAS, ED - Abnormal; Notable for the following components:   pCO2, Ven 43.2 (*)    pO2, Ven 25 (*)    Potassium 6.1 (*)    Calcium, Ion 1.03 (*)    HCT 34.0 (*)    Hemoglobin 11.6 (*)    All other components within normal limits  I-STAT CREATININE, ED - Abnormal; Notable for the following components:   Creatinine, Ser 1.50 (*)    All other components within normal limits  TROPONIN I (HIGH SENSITIVITY) - Abnormal; Notable for the following components:   Troponin I (High Sensitivity) 89 (*)    All other components within normal limits  TROPONIN I (HIGH SENSITIVITY) - Abnormal; Notable for the following components:   Troponin I (High Sensitivity) 94 (*)    All other components within normal limits     _________________________ Troponin 89 - 94 ECG: Ordered Personally reviewed and interpreted by me showing: HR : 85 Rhythm: Atrial premature complex Right bundle branch block QTC 480   The recent clinical data is shown below. Vitals:   09/21/22 1738 09/21/22 1745 09/21/22 1800 09/21/22 1845  BP:  97/64 111/64 104/61  Pulse:  85 93 (!) 107  Resp:  (!) 21  (!) 25  Temp: 98.5 F (36.9 C)     TempSrc: Oral     SpO2:  98% 97% 98%  Weight:      Height:         WBC     Component Value Date/Time   WBC 5.4 09/21/2022 1348   LYMPHSABS 0.2 (L) 09/21/2022 1348   LYMPHSABS 1.0 01/27/2017 0834   MONOABS 0.3 09/21/2022 1348   EOSABS 0.0 09/21/2022 1348   EOSABS 0.1 01/27/2017 0834   BASOSABS 0.0 09/21/2022 1348   BASOSABS 0.0 01/27/2017 0834    Lactic Acid, Venous    Component Value Date/Time   LATICACIDVEN 1.8 05/04/2021 1920       UA   ordered    Results for orders placed or performed during the hospital encounter of 09/21/22  Resp panel by RT-PCR (RSV, Flu  A&B, Covid) Anterior Nasal Swab     Status: None   Collection Time: 09/21/22  1:36 PM   Specimen: Anterior Nasal Swab  Result Value Ref Range Status   SARS Coronavirus 2 by RT PCR NEGATIVE NEGATIVE Final   Influenza A by PCR NEGATIVE NEGATIVE Final   Influenza B by PCR NEGATIVE NEGATIVE Final         Resp Syncytial Virus by PCR NEGATIVE NEGATIVE Final    _______________________________________________ Hospitalist was called for admission for  Pleural effusion on left   Acute respiratory failure    Other acute pulmonary embolism with acute cor pulmonale (HCC)    Atrial fibrillation with RVR (HCC)    The following Work up has been ordered so far:  Orders Placed This Encounter  Procedures   Critical Care   Resp panel by RT-PCR (RSV, Flu A&B, Covid) Anterior Nasal Swab   DG Chest Port 1 View   CT Head Wo Contrast   CT Angio Chest PE W and/or Wo Contrast   CBC with Differential   Basic metabolic panel   Brain natriuretic peptide   Lactic acid, plasma   APTT   Protime-INR   Diet Heart Room service appropriate? Yes; Fluid consistency: Thin   Cardiac monitoring   Cardiac Monitoring -  Continuous Indefinite   Consult to intensivist  On anticoagulation has PE with right heart strain   Consult to hospitalist  PE with right heart strain   heparin per pharmacy consult   Pulse oximetry (single)   CPAP   I-Stat venous blood gas, ([place] [facility], [facility], [facility])   I-Stat Creatinine, ED (not at [facility] or [facility])   ED EKG   ECHOCARDIOGRAM COMPLETE   Insert peripheral IV   Place in observation (patient's expected length of stay will be less than 2 midnights)   VAS [place] LOWER EXTREMITY VENOUS (DVT)     OTHER Significant initial  Findings:  labs showing:   Recent Labs  Lab 09/21/22 1402 09/21/22 1457  NA 138 139  K 6.1* 3.4*  CO2  --  24  GLUCOSE  --  122*  BUN  --  27*  CREATININE 1.50* 1.43*  CALCIUM  --  8.1*    Cr      Up from baseline see below Lab Results  Component Value Date    CREATININE 1.43 (H) 09/21/2022   CREATININE 1.50 (H) 09/21/2022   CREATININE 1.23 09/08/2022    Lab Results  Component Value Date   CALCIUM 8.1 (L) 09/21/2022   PHOS 3.3 08/19/2022       Plt: Lab Results  Component Value Date   PLT 113 (L) 09/21/2022        Recent Labs  Lab 09/21/22 1348 09/21/22 1402  WBC 5.4  --   NEUTROABS 4.8  --   HGB 11.6* 11.6*  HCT 36.4* 34.0*  MCV 102.0*  --   PLT 113*  --     HG/HCT  stable,       Component Value Date/Time   HGB 11.6 (L) 09/21/2022 1402   HGB 12.7 (L) 09/08/2022 0931   HGB 13.7 01/27/2017 0834   HCT 34.0 (L) 09/21/2022 1402   HCT 39.2 01/27/2017 0834   MCV 102.0 (H) 09/21/2022 1348   MCV 98 (H) 01/27/2017 0834      Cultures:    Component Value Date/Time   SDES CSF 08/21/2022 1337   SPECREQUEST NONE 08/21/2022 1337   CULT  08/21/2022 1337    NO GROWTH 3 DAYS Performed at [facility], [address] [place], [place] [address]    REPTSTATUS 08/24/2022 FINAL 08/21/2022 1337     Radiological Exams on Admission: CT Angio Chest PE W and/or Wo Contrast  Result Date: 09/21/2022 CLINICAL DATA:  Shortness of breath. History of pulmonary embolism and lung cancer. EXAM: CT ANGIOGRAPHY CHEST WITH CONTRAST TECHNIQUE: Multidetector CT imaging of the chest was performed using the standard protocol during bolus administration of intravenous contrast. Multiplanar CT image reconstructions and MIPs were obtained to evaluate the vascular anatomy. RADIATION DOSE REDUCTION: This exam was performed according to the departmental dose-optimization program which includes automated exposure control, adjustment of the mA and/or kV according to patient size and/or use of iterative reconstruction technique. CONTRAST:  77m OMNIPAQUE IOHEXOL 350 MG/ML SOLN COMPARISON:  PET-CT dated July 17, 2022. FINDINGS: Cardiovascular: Satisfactory opacification of the pulmonary arteries to the segmental level. Acute occlusive pulmonary emboli in the  right interlobar pulmonary artery, right middle lobar and segmental pulmonary arteries, and right superior and basal segmental pulmonary arteries. Mild cardiomegaly with right heart enlargement. Elevated RV/LV ratio of 1.35. Prior CABG. No pericardial effusion. No thoracic aortic aneurysm or dissection. Coronary, aortic arch, and branch vessel atherosclerotic vascular disease. Mediastinum/Nodes: No enlarged mediastinal, hilar, or axillary lymph nodes. Unchanged calcified  right hilar and subcarinal lymph nodes. Thyroid gland, trachea, and esophagus demonstrate no significant findings. Lungs/Pleura: Postsurgical changes from prior left upper lobectomy. Small subpleural opacity in the superior segment of the right upper lobe may reflect developing infarct or atelectasis. New mild right basilar subsegmental atelectasis. Unchanged calcified granuloma in the posterior right lower lobe. Unchanged small loculated left pleural effusion with significant irregular pleural thickening at the posterior costophrenic angle. Unchanged scarring in the posterior left lower lobe. Upper Abdomen: Enlarging left adrenal metastasis, currently 4.6 x 3.3 cm, previously nonmeasurable. Unchanged right adrenal metastasis. Musculoskeletal: Unchanged T1 and T10 sclerotic metastases. Review of the MIP images confirms the above findings. IMPRESSION: 1. Acute occlusive lobar and segmental pulmonary emboli in the right middle and lower lobes. Positive for acute PE with CT evidence of right heart strain (RV/LV Ratio = 1.35 ) consistent with at least submassive (intermediate risk) PE. The presence of right heart strain has been associated with an increased risk of morbidity and mortality. Please refer to the "PE Focused" order set in EPIC. 2. Small subpleural opacity in the superior segment of the right upper lobe may reflect developing infarct or atelectasis. 3. Unchanged small loculated left pleural effusion with significant irregular pleural  thickening at the posterior costophrenic angle, concerning for malignancy. 4. Enlarging left adrenal metastasis, currently 4.6 x 3.3 cm, previously non-measurable. Unchanged right adrenal metastasis. 5. Unchanged T1 and T10 sclerotic metastases. 6.  Aortic atherosclerosis (ICD10-I70.0). Critical Value/emergent results were called by telephone at the time of interpretation on 09/21/2022 at 5:59 pm to provider DAN FLOYD, who verbally acknowledged these results. Electronically Signed   By: Titus Dubin M.D.   On: 09/21/2022 18:03   CT Head Wo Contrast  Result Date: 09/21/2022 CLINICAL DATA:  Gait instability.  History of lung cancer. EXAM: CT HEAD WITHOUT CONTRAST TECHNIQUE: Contiguous axial images were obtained from the base of the skull through the vertex without intravenous contrast. RADIATION DOSE REDUCTION: This exam was performed according to the departmental dose-optimization program which includes automated exposure control, adjustment of the mA and/or kV according to patient size and/or use of iterative reconstruction technique. COMPARISON:  CT head dated August 19, 2022 FINDINGS: Brain: No evidence of acute infarction, hemorrhage, hydrocephalus, extra-axial collection or mass lesion/mass effect. Low-attenuation of the periventricular white matter presumed chronic microvascular ischemic changes. Vascular: No hyperdense vessel or unexpected calcification. Skull: Normal. Negative for fracture or focal lesion. Sinuses/Orbits: Partial opacification of the left maxillary sinus. Remaining paranasal sinuses and mastoid air cells are clear. Other: None. IMPRESSION: 1. No acute intracranial abnormality. 2. Chronic microvascular ischemic changes of the white matter. 3. Partial opacification of the left maxillary sinus. Electronically Signed   By: Keane Police D.O.   On: 09/21/2022 17:23   DG Chest Port 1 View  Result Date: 09/21/2022 CLINICAL DATA:  Shortness of breath.  New oxygen requirement. EXAM: PORTABLE  CHEST 1 VIEW COMPARISON:  09/08/2022. FINDINGS: 1343 hours. Unchanged small left pleural effusion and chronic volume loss in the left hemithorax. Unchanged nodular opacity in the right mid lung. No new airspace opacity. Stable cardiac and mediastinal contours. No pneumothorax. IMPRESSION: Unchanged small left pleural effusion. Unchanged nodular opacity in the right mid lung. Electronically Signed   By: Emmit Alexanders M.D.   On: 09/21/2022 13:52   _______________________________________________________________________________________________________ Latest  Blood pressure 104/61, pulse (!) 107, temperature 98.5 F (36.9 C), temperature source Oral, resp. rate (!) 25, height '5\' 11"'$  (1.803 m), weight 83.9 kg, SpO2 98 %.   Vitals  labs and radiology finding personally reviewed  Review of Systems:    Pertinent positives include:  fatigue, shortness of breath at rest.   dyspnea on exertion,  Constitutional:  No weight loss, night sweats, Fevers chills, weight loss  HEENT:  No headaches, Difficulty swallowing,Tooth/dental problems,Sore throat,  No sneezing, itching, ear ache, nasal congestion, post nasal drip,  Cardio-vascular:  No chest pain, Orthopnea, PND, anasarca, dizziness, palpitations.no Bilateral lower extremity swelling  GI:  No heartburn, indigestion, abdominal pain, nausea, vomiting, diarrhea, change in bowel habits, loss of appetite, melena, blood in stool, hematemesis Resp:   No excess mucus, no productive cough, No non-productive cough, No coughing up of blood.No change in color of mucus.No wheezing. Skin:  no rash or lesions. No jaundice GU:  no dysuria, change in color of urine, no urgency or frequency. No straining to urinate.  No flank pain.  Musculoskeletal:  No joint pain or no joint swelling. No decreased range of motion. No back pain.  Psych:  No change in mood or affect. No depression or anxiety. No memory loss.  Neuro: no localizing neurological complaints, no  tingling, no weakness, no double vision, no gait abnormality, no slurred speech, no confusion  All systems reviewed and apart from [place] all are negative _______________________________________________________________________________________________ Past Medical History:   Past Medical History:  Diagnosis Date   BPH (benign prostatic hypertrophy)    Bradycardia    a. H/o asymptomatic bradycardia.   Coronary artery disease    a. s/p CABG ~2007 with LIMA to LAD, SVG seq to OM1 and OM2, SVG to PDA and SVG to diag. b. Abnormal nuc 03/2015 - s/p DES to SVG-OM1-OM2. 01/29/17 POBA with cutting balloon to SVG-->OM.  No ischemia Lexi myoview 2021   DCM (dilated cardiomyopathy) ([place]) 09/17/2015   mixed by cMRI 03/2021 with EF 39% with non viable infarct in basal to mid lateral wall and LGE pattern c/w nonischemic component as well. Mild RV dysfunction RVEF 42%.   Dyslipidemia    Dyspnea    r/t lung mass   ED (erectile dysfunction)    Fear of heights    Goals of care, counseling/discussion 06/19/2020   History of radiation therapy    T spine and left ilium 10/07/2021-10/20/2021  Dr Gery Pray   Hypertension    Non-small cell carcinoma of lung, stage 4, left ([place]) 06/19/2020   OSA (obstructive sleep apnea) 07/12/2015   Moderate OSA with AHI 16.5/hr now on CPAP at 8cm H2O, uses cpap nightly      RBBB    Shoulder, capsulitis, adhesive    Left Shoulder      Past Surgical History:  Procedure Laterality Date   APPENDECTOMY     BRONCHIAL BIOPSY  05/09/2020   Procedure: BRONCHIAL BIOPSIES;  Surgeon: [name], DO;  Location: [facility];  Service: Pulmonary;;   BRONCHIAL BRUSHINGS  05/09/2020   Procedure: BRONCHIAL BRUSHINGS;  Surgeon: [name], DO;  Location: [facility];  Service: Pulmonary;;   BRONCHIAL NEEDLE ASPIRATION BIOPSY  05/09/2020   Procedure: BRONCHIAL NEEDLE ASPIRATION BIOPSIES;  Surgeon: [name], DO;  Location: [facility];  Service: Pulmonary;;   BRONCHIAL  WASHINGS  05/09/2020   Procedure: BRONCHIAL WASHINGS;  Surgeon: [name], DO;  Location: [facility];  Service: Pulmonary;;   CARDIAC CATHETERIZATION N/A 04/05/2015   Procedure: Left Heart Cath and Coronary Angiography;  Surgeon: Sherren Mocha, MD;  Location: [facility];  Service: Cardiovascular;  Laterality: N/A;   COLONOSCOPY  CORONARY ARTERY BYPASS GRAFT     w/LIMA to LAD, seq SVG to OM1 and OM2, SVG to PDA and SVG to Diagonal   CORONARY BALLOON ANGIOPLASTY N/A 01/29/2017   Procedure: Coronary Balloon Angioplasty;  Surgeon: Martinique, Peter M, MD;  Location: [facility];  Service: Cardiovascular;  Laterality: N/A;   FIDUCIAL MARKER PLACEMENT  05/09/2020   Procedure: FIDUCIAL MARKER PLACEMENT;  Surgeon: [name], DO;  Location: [facility];  Service: Pulmonary;;   INTERCOSTAL NERVE BLOCK Left 05/22/2020   Procedure: INTERCOSTAL NERVE BLOCK;  Surgeon: Lajuana Matte, MD;  Location: [facility];  Service: Thoracic;  Laterality: Left;   LEFT HEART CATH AND CORS/GRAFTS ANGIOGRAPHY N/A 01/29/2017   Procedure: Left Heart Cath and Cors/Grafts Angiography;  Surgeon: Martinique, Peter M, MD;  Location: [facility];  Service: Cardiovascular;  Laterality: N/A;   NODE DISSECTION N/A 05/22/2020   Procedure: NODE DISSECTION;  Surgeon: Lajuana Matte, MD;  Location: [facility];  Service: Thoracic;  Laterality: N/A;   VIDEO BRONCHOSCOPY WITH ENDOBRONCHIAL NAVIGATION N/A 05/09/2020   Procedure: VIDEO BRONCHOSCOPY WITH ENDOBRONCHIAL NAVIGATION;  Surgeon: [name], DO;  Location: [facility];  Service: Pulmonary;  Laterality: N/A;   VIDEO BRONCHOSCOPY WITH ENDOBRONCHIAL ULTRASOUND N/A 05/09/2020   Procedure: VIDEO BRONCHOSCOPY WITH ENDOBRONCHIAL ULTRASOUND;  Surgeon: [name], DO;  Location: [facility];  Service: Pulmonary;  Laterality: N/A;    Social History:  Ambulatory  independent      reports that he quit smoking about 39 years ago. His smoking use  included cigarettes. He quit smokeless tobacco use about 36 years ago. He reports current alcohol use of about 20.0 - 28.0 standard drinks of alcohol per week. He reports that he does not use drugs.   Family History:  Family History  Problem Relation Age of Onset   Lung cancer Mother    Lung cancer Father    Heart disease Father    Heart failure Father    Dementia Father    Cancer Sister    ______________________________________________________________________________________________ Allergies: Allergies  Allergen Reactions   Amlodipine Swelling    Swelling of ankles    Prior to Admission medications   Medication Sig Start Date End Date Taking? Authorizing Provider  aspirin 81 MG tablet Take 81 mg by mouth at bedtime.     [provider]  colchicine 0.6 MG tablet Take 1 tablet (0.6 mg total) by mouth every 8 (eight) hours as needed. 09/29/21   [name], MD  Cyanocobalamin (B-12 PO) Take 1,000 mcg by mouth daily.     [provider]  diphenoxylate-atropine (LOMOTIL) 2.5-0.025 MG tablet TAKE 2 TABLETS BY MOUTH AT ONSET OF DIARRHEA THEN TAKE 1 TABLET AFTER EACH LOOSE STOOL MAX 8 TABLETS PER DAY 05/19/22   Celso Amy, NP  ELIQUIS 5 MG TABS tablet TAKE 1 TABLET BY MOUTH TWICE A DAY 06/27/22   [name], MD  ezetimibe (ZETIA) 10 MG tablet Take 1 tablet (10 mg total) by mouth daily. 10/15/21   Sueanne Margarita, MD  folic acid (FOLVITE) 1 MG tablet TAKE ONE TABLET BY MOUTH DAILY START 5 TO 7 DAYS PRIOR TO CHEMO AND CONTINUE UNTIL 21 DAYS AFTER COMPLETES 06/15/22   [name], MD  levothyroxine (SYNTHROID) 50 MCG tablet Take 1 tablet (50 mcg total) by mouth daily before breakfast. 06/17/22   [name], MD  metoprolol succinate (TOPROL-XL) 25 MG 24 hr tablet TAKE ONE TABLET BY MOUTH EVERY NIGHT  AT BEDTIME 08/28/22   Sueanne Margarita, MD  Multiple Vitamin (MULTIVITAMIN) capsule Take 1 capsule by mouth daily.    [provider]  nitroGLYCERIN  (NITROSTAT) 0.4 MG SL tablet Place 1 tablet (0.4 mg total) under the tongue every 5 (five) minutes as needed for chest pain. 06/03/21   Sueanne Margarita, MD  oxyCODONE-acetaminophen (PERCOCET) 7.5-325 MG tablet Take 1 tablet by mouth every 4 (four) hours as needed for severe pain. 09/09/21   [name], MD  pantoprazole (PROTONIX) 40 MG tablet Take 1 tablet (40 mg total) by mouth daily. 08/26/22   Nita Sells, MD  predniSONE (DELTASONE) 10 MG tablet Take 1 tablet (10 mg total) by mouth daily with breakfast. 06/17/22   [name], MD  predniSONE (DELTASONE) 20 MG tablet Take 3 tablets (60 mg total) by mouth daily with breakfast for 5 days, THEN 2 tablets (40 mg total) daily with breakfast for 25 days. Continue 40 mg dose until discontinued by Physician.. 08/26/22 09/25/22  [name], MD  rosuvastatin (CRESTOR) 40 MG tablet TAKE ONE TABLET BY MOUTH EVERY NIGHT AT BEDTIME 06/16/22   Turner, Eber Hong, MD  sacubitril-valsartan (ENTRESTO) 24-26 MG TAKE ONE TABLET BY MOUTH TWICE A DAY 06/08/22   Sueanne Margarita, MD  sertraline (ZOLOFT) 100 MG tablet Take 100 mg by mouth 2 (two) times daily. 10/08/21   [provider]  sucralfate (CARAFATE) 1 g tablet Take 1 tablet (1 g total) by mouth 4 (four) times daily. Dissolve each tablet in 15 cc water before use. Patient taking differently: Take 1 g by mouth 4 (four) times daily as needed. Dissolve each tablet in 15 cc water before use. 10/24/21   Kyung Rudd, MD  traMADol (ULTRAM) 50 MG tablet Take 1 tablet (50 mg total) by mouth every 6 (six) hours as needed. 09/22/21   [name], MD  prochlorperazine (COMPAZINE) 10 MG tablet Take 1 tablet (10 mg total) by mouth every 6 (six) hours as needed (Nausea or vomiting). 06/24/20 04/25/21  [name], MD    ___________________________________________________________________________________________________ Physical Exam:    09/21/2022    6:45 PM 09/21/2022    6:00 PM 09/21/2022    5:45 PM   Vitals with BMI  Systolic [id number]  Diastolic 61 64 64  Pulse [id number] 93 85     1. General:  in No  Acute distress pain   Chronically ill   -appearing 2. Psychological: Alert and   Oriented 3. Head/ENT:    Dry Mucous Membranes                          Head Non traumatic, neck supple                          Poor Dentition 4. SKIN:  decreased Skin turgor,  Skin clean Dry and intact no rash 5. Heart: Regular rate and rhythm no  Murmur, no Rub or gallop 6. Lungs: no wheezes or crackles   7. Abdomen: Soft,  non-tender, Non distended  bowel sounds present 8. Lower extremities: no clubbing, cyanosis, no  edema 9. Neurologically Grossly intact, moving all 4 extremities equally strength diminished in lower extremities a 4 out of 5 bilaterally extremities cranial nerves II through XII intact 10. MSK: Normal range of motion    Chart has been reviewed  _____________________   Assessment/Plan 77 y.o. male with medical history significant  of lung cancer, A-fib on close, PE, systolic CHF EF [id number], gout, hypothyroidism, GERD, paraneoplastic syndrome    Admitted for   Pleural effusion on left  Other acute pulmonary embolism with acute cor pulmonale (HCC)    Atrial fibrillation with RVR ([place])    Present on Admission:  Pulmonary embolism (HCC)  AKI (acute kidney injury) ([place])  Non-small cell carcinoma of lung, stage 4, left (HCC)  Hyperlipemia  CAD (coronary artery disease), native coronary artery  Acute respiratory failure with hypoxia (HCC)  Chronic systolic CHF (congestive heart failure) (HCC)  Hypokalemia  Pleural effusion, malignant  Paroxysmal atrial fibrillation with RVR (HCC)  OSA (obstructive sleep apnea)  Hypotension  Hypothyroidism    AKI (acute kidney injury) ([place]) Obtain electrolytes Rehydrate  Pulmonary embolism ([place]) Failed outpatient Eliquis  Admit to progressive Initiate heparin drip  Would likely benefit from case manager consult for long term  anticoagulation Hold home blood pressure medications avoid hypotension Cycle cardiac enzymes Order echogram and lower extremity Dopplers  Most likely risk factors for hypercoagulable state being malignancy    Given hypotension and large PE [facility] M consulted and code PE activated, per PCCM patient not a candidate for tPA recommend admission to progressive   Non-small cell carcinoma of lung, stage 4, left ([place]) Will notify oncology patient is being admitted  Ataxia Has been undergoing workup for possible paraneoplastic syndrome have had recent MRI brain and spine for metastatic screening. Discussed with Neurology most likely cause his immunosuppressive medications continue steroids address medical issues and see if pt exercise tolerance improves   Will need to continue follow-up as an outpatient with neurology/oncology  Hyperlipemia Continue Crestor at 40 mg a day  CAD (coronary artery disease), native coronary artery Continue aspirin  Toprol and Crestor  Acute respiratory failure with hypoxia (HCC) Secondary to PE noted to be hypoxic down to 70% room air currently on 2 L satting mid 90s.  Provide oxygen as needed  Chronic systolic CHF (congestive heart failure) (HCC) Current appears to be on a dry side initially hypotensive responded well to IV fluids.  Continue to gently rehydrate Given AKI hold Entresto  Hypokalemia Repleat and check electrolytes Replete magnesium and phosphate as needed recheck in a.m.  Pleural effusion, malignant Known prior patient has been seen in the past by cardiothoracic surgery Dr. Kipp Brood  Fall at home, initial encounter Ct head negative  Paroxysmal atrial fibrillation with RVR ([place]) Hold diltiazem for now given hypotension  Now on heparin Hold metoprolol given hypotension     OSA (obstructive sleep apnea) Not cooperative with CPAP  Hypotension Hold home bp  medications Rehydrate Stress dose steroids   Hypothyroidism Continue synthroid  at 50 mcg a day    Other plan as per orders.  DVT prophylaxis:  heparin    Code Status:    Code Status: Prior  DNR/DNI   as per patient   I had personally discussed CODE STATUS with patient and family   ACP reviewed  Family Communication:   Family   at  Bedside  plan of care was discussed  with  Wife,   Disposition Plan:      To home once workup is complete and patient is stable   Following barriers for discharge:                            Electrolytes corrected  Will need consultants to evaluate patient prior to discharge                       Would benefit from PT/OT eval prior to DC  Ordered                                       Transition of care consulted          Consults called:  PCCM aware, emailed oncology  Discussed with Neurology   Admission status:  ED Disposition     ED Disposition  [place]: [place] [[id number]]  Level of Care: Progressive [102]  Admit to Progressive based on following criteria: CARDIOVASCULAR & THORACIC of moderate stability with acute coronary syndrome symptoms/low risk myocardial infarction/hypertensive urgency/arrhythmias/heart failure potentially compromising stability and stable post cardiovascular intervention patients.  May place patient in observation at [facility] or [place] if equivalent level of care is available:: No  Covid Evaluation: Confirmed COVID Negative  Diagnosis: Pulmonary embolism ([place]) [id number]  Admitting Physician: Toy Baker [[id number]]  Attending Physician: Toy Baker [[id number]]          Obs      Level of care        progressive tele indefinitely please discontinue once patient no longer qualifies COVID-19 Labs   Lab Results  Component Value Date   [place] 09/21/2022     Precautions: admitted as   Covid Negative        John Hinton 09/21/2022, 8:37 PM     [facility]     after 2 AM please page floor coverage PA If 7AM-7PM, please contact the day team taking care of the patient using [email or web address]   Patient was evaluated in the context of the global COVID-19 pandemic, which necessitated consideration that the patient might be at risk for infection with the SARS-CoV-2 virus that causes COVID-19. Institutional protocols and algorithms that pertain to the evaluation of patients at risk for COVID-19 are in a state of rapid change based on information released by regulatory bodies including the CDC and federal and state organizations. These policies and algorithms were followed during the patient's care.

## 2022-09-21 NOTE — Assessment & Plan Note (Signed)
Failed outpatient Eliquis  Admit to progressive Initiate heparin drip  Would likely benefit from case manager consult for long term anticoagulation Hold home blood pressure medications avoid hypotension Cycle cardiac enzymes Order echogram and lower extremity Dopplers  Most likely risk factors for hypercoagulable state being malignancy    Given hypotension and large PE [facility] M consulted and code PE activated, per PCCM patient not a candidate for tPA recommend admission to progressive

## 2022-09-21 NOTE — ED Triage Notes (Signed)
Pt to ED via EMS from home c/o [facility], of note pt has hx of PEs and recent airplane travel. Pt also has hx of lung ca, currently undergoing treatment. Pt noted to be in AFIB with EMS. Pt wife also concerned that he has been more"unsteady on feet" x 2 days than his normal. Last VS: 150/123, 98%2L, HR 135. #18LAC, no medications given by EMS.

## 2022-09-21 NOTE — Assessment & Plan Note (Signed)
Not cooperative with CPAP

## 2022-09-21 NOTE — Assessment & Plan Note (Signed)
Known prior patient has been seen in the past by cardiothoracic surgery Dr. Kipp Brood

## 2022-09-21 NOTE — Progress Notes (Signed)
ANTICOAGULATION CONSULT NOTE - Initial Consult  Pharmacy Consult for heparin Indication: pulmonary embolus  Allergies  Allergen Reactions   Amlodipine Swelling    Swelling of ankles    Patient Measurements: Height: '5\' 11"'$  (180.3 cm) Weight: 83.9 kg (185 lb) IBW/kg (Calculated) : 75.3 Heparin Dosing Weight: 84kg  Vital Signs: Temp: 98.5 F (36.9 C) (03/04 1738) Temp Source: Oral (03/04 1738) BP: 93/78 (03/04 1915) Pulse Rate: 84 (03/04 1915)  Labs: Recent Labs    09/21/22 1348 09/21/22 1402 09/21/22 1457 09/21/22 1539 09/21/22 1751  HGB 11.6* 11.6*  --   --   --   HCT 36.4* 34.0*  --   --   --   PLT 113*  --   --   --   --   CREATININE  --  1.50* 1.43*  --   --   TROPONINIHS  --   --   --  89* 94*    Estimated Creatinine Clearance: 48.3 mL/min (A) (by C-G formula based on SCr of 1.43 mg/dL (H)).   Medical History: Past Medical History:  Diagnosis Date   BPH (benign prostatic hypertrophy)    Bradycardia    a. H/o asymptomatic bradycardia.   Coronary artery disease    a. s/p CABG ~2007 with LIMA to LAD, SVG seq to OM1 and OM2, SVG to PDA and SVG to diag. b. Abnormal nuc 03/2015 - s/p DES to SVG-OM1-OM2. 01/29/17 POBA with cutting balloon to SVG-->OM.  No ischemia Lexi myoview 2021   DCM (dilated cardiomyopathy) ([place]) 09/17/2015   mixed by cMRI 03/2021 with EF 39% with non viable infarct in basal to mid lateral wall and LGE pattern c/w nonischemic component as well. Mild RV dysfunction RVEF 42%.   Dyslipidemia    Dyspnea    r/t lung mass   ED (erectile dysfunction)    Fear of heights    Goals of care, counseling/discussion 06/19/2020   History of radiation therapy    T spine and left ilium 10/07/2021-10/20/2021  Dr Gery Pray   Hypertension    Non-small cell carcinoma of lung, stage 4, left ([place]) 06/19/2020   OSA (obstructive sleep apnea) 07/12/2015   Moderate OSA with AHI 16.5/hr now on CPAP at 8cm H2O, uses cpap nightly      RBBB    Shoulder, capsulitis,  adhesive    Left Shoulder    Medications:  (Not in a hospital admission)  Scheduled:   heparin  5,000 Units Intravenous Once   potassium chloride  20 mEq Oral Once   Infusions:   diltiazem (CARDIZEM) infusion 5 mg/hr (09/21/22 1742)   heparin      Assessment: Pt with a hx of PE in 10/22 presented with a new PE. He has been on apixaban for AF so likely NOAC failure. Last dose this AM. He has a hx of stage 4 lung CA. Heparin has been order pending possible lytic therapy. We will use PTT to monitor for now until correlated.   Scr 1.43 Hgb 11.6 Plt 94  Goal of Therapy:  Heparin level 0.3-0.7 units/ml aPTT: 66-102 Monitor platelets by anticoagulation protocol: Yes   Plan:  Baseline aPTT/HL Heparin bolus 5000 units x1 Heparin infusion 1350 units/hr Check 8 hr PTT/heparin then daily Daily CBC  Onnie Boer, PharmD, BCIDP, AAHIVP, CPP Infectious Disease Pharmacist 09/21/2022 7:37 PM

## 2022-09-21 NOTE — Consult Note (Signed)
NAME:  John Hinton, MRN:  [id number], DOB:  May 24, 1946, LOS: 0 ADMISSION DATE:  09/21/2022, CONSULTATION DATE:  3/4 REFERRING MD:  Dr. Tyrone Nine, CHIEF COMPLAINT:  PE   History of Present Illness:  Patient is a 77 year old male with pertinent PMH of stage IV lung cancer, PE 04/2021, A-fib on eliquis, CAD s/p CABG 2007, DCM, HLD, HTN, OSA on CPAP presents to [facility] ED on 3/4 with PE.  Patient states that he has been having SOB over the past 2 weeks while in [facility] and he thought it may be due to the elevation. Flight 3.5 hours each way. States he recently traveled to [place] by plane.  Denies any chest pain. EMS called on 3/4 for worsening SOB. Of note he did fall that morning, but it was a mechanical fall. Found to be hypotensive and tachycardic.  Transported to [facility] ED.  Upon arrival to [facility] ED patient on room air with sats high 90s.  BP initially soft given IV fluids with improvement in BP.  Afebrile.  CXR with no significant change; small left pleural effusion and unchanged nodular opacity in RML.  CTA chest showing occlusive lobar/segmental PE and RML/RLL; RV/LV ratio 1.35 consistent with submassive PE.  Troponin 89. PCCM consulted.  Pertinent  Medical History   Past Medical History:  Diagnosis Date   BPH (benign prostatic hypertrophy)    Bradycardia    a. H/o asymptomatic bradycardia.   Coronary artery disease    a. s/p CABG ~2007 with LIMA to LAD, SVG seq to OM1 and OM2, SVG to PDA and SVG to diag. b. Abnormal nuc 03/2015 - s/p DES to SVG-OM1-OM2. 01/29/17 POBA with cutting balloon to SVG-->OM.  No ischemia Lexi myoview 2021   DCM (dilated cardiomyopathy) ([place]) 09/17/2015   mixed by cMRI 03/2021 with EF 39% with non viable infarct in basal to mid lateral wall and LGE pattern c/w nonischemic component as well. Mild RV dysfunction RVEF 42%.   Dyslipidemia    Dyspnea    r/t lung mass   ED (erectile dysfunction)    Fear of heights    Goals of care, counseling/discussion 06/19/2020   History  of radiation therapy    T spine and left ilium 10/07/2021-10/20/2021  Dr Gery Pray   Hypertension    Non-small cell carcinoma of lung, stage 4, left ([place]) 06/19/2020   OSA (obstructive sleep apnea) 07/12/2015   Moderate OSA with AHI 16.5/hr now on CPAP at 8cm H2O, uses cpap nightly      RBBB    Shoulder, capsulitis, adhesive    Left Shoulder     Significant Hospital Events: Including procedures, antibiotic start and stop dates in addition to other pertinent events   3/4 admitted w/ PE  Interim History / Subjective:    Objective   Blood pressure 111/64, pulse 93, temperature 98.5 F (36.9 C), temperature source Oral, resp. rate (!) 21, height '5\' 11"'$  (1.803 m), weight 83.9 kg, SpO2 97 %.       No intake or output data in the 24 hours ending 09/21/22 1832 Filed Weights   09/21/22 1314  Weight: 83.9 kg    Examination: General: Elderly male HENT: [place]/AT, PERRL, no JVD  Lungs: Clear bilateral breath sounds Cardiovascular: Tachy, IRIR, no MRG Abdomen: Soft, NT, ND Extremities:  No acute deformity Neuro: Alert, oriented, non-focal  Resolved Hospital Problem list     Assessment & Plan:  Submassive PE -CTA chest showing occlusive lobar/segmental PE and RML/RLL; RV/LV ratio 1.35 consistent with submassive  PE Hx of PE 04/2021 Plan: - on room air - Heparin gtt - telemetry monitoring - Check BNP - Trend troponin and LA - Check echo and DVT US - Consider hematology consult - DOAC failure - Bed rest - Dr. Loanne Drilling discussed options with patient. If systemic lytics were needed for life saving therapy he accepts the increased risk of hemorrhage. Last dose of Eliquis 3/4 early AM.   OSA on CPAP Plan: -CPAP nightly  Stage IV lung cancer w/ mets to R lung adrenals and bones Afib on eliquis CAD s/p CABG 2007 HLD HTN DCM BPH Plan: -per primary -low threshold to dc diltiazem   Best Practice (right click and "Reselect all SmartList Selections" daily)   Per  primary  Labs   CBC: Recent Labs  Lab 09/21/22 1348 09/21/22 1402  WBC 5.4  --   NEUTROABS 4.8  --   HGB 11.6* 11.6*  HCT 36.4* 34.0*  MCV 102.0*  --   PLT 113*  --     Basic Metabolic Panel: Recent Labs  Lab 09/21/22 1402 09/21/22 1457  NA 138 139  K 6.1* 3.4*  CL  --  105  CO2  --  24  GLUCOSE  --  122*  BUN  --  27*  CREATININE 1.50* 1.43*  CALCIUM  --  8.1*   GFR: Estimated Creatinine Clearance: 48.3 mL/min (A) (by C-G formula based on SCr of 1.43 mg/dL (H)). Recent Labs  Lab 09/21/22 1348  WBC 5.4    Liver Function Tests: No results for input(s): "AST", "ALT", "ALKPHOS", "BILITOT", "PROT", "ALBUMIN" in the last 168 hours. No results for input(s): "LIPASE", "AMYLASE" in the last 168 hours. No results for input(s): "AMMONIA" in the last 168 hours.  ABG    Component Value Date/Time   PHART 7.478 (H) 05/04/2021 2130   PCO2ART 27.5 (L) 05/04/2021 2130   PO2ART 69 (L) 05/04/2021 2130   HCO3 26.9 09/21/2022 1402   TCO2 28 09/21/2022 1402   ACIDBASEDEF 2.0 05/04/2021 2130   O2SAT 46 09/21/2022 1402     Coagulation Profile: No results for input(s): "INR", "PROTIME" in the last 168 hours.  Cardiac Enzymes: No results for input(s): "CKTOTAL", "CKMB", "CKMBINDEX", "TROPONINI" in the last 168 hours.  HbA1C: No results found for: "HGBA1C"  CBG: No results for input(s): "GLUCAP" in the last 168 hours.  Review of Systems:   Bolds are positive  Constitutional: weight loss, gain, night sweats, Fevers, chills, fatigue .  HEENT: headaches, Sore throat, sneezing, nasal congestion, post nasal drip, Difficulty swallowing, Tooth/dental problems, visual complaints visual changes, ear ache CV:  chest pain, radiates:,Orthopnea, PND, swelling in lower extremities, dizziness, palpitations, syncope.  GI  heartburn, indigestion, abdominal pain, nausea, vomiting, diarrhea, change in bowel habits, loss of appetite, bloody stools.  Resp: cough, productive: , hemoptysis,  dyspnea, chest pain, pleuritic.  Skin: rash or itching or icterus GU: dysuria, change in color of urine, urgency or frequency. flank pain, hematuria  MS: joint pain or swelling. decreased range of motion  Psych: change in mood or affect. depression or anxiety.  Neuro: difficulty with speech, weakness, numbness, ataxia    Past Medical History:  He,  has a past medical history of BPH (benign prostatic hypertrophy), Bradycardia, Coronary artery disease, DCM (dilated cardiomyopathy) ([place]) (09/17/2015), Dyslipidemia, Dyspnea, ED (erectile dysfunction), Fear of heights, Goals of care, counseling/discussion (06/19/2020), History of radiation therapy, Hypertension, Non-small cell carcinoma of lung, stage 4, left ([place]) (06/19/2020), OSA (obstructive sleep apnea) (07/12/2015), RBBB, and Shoulder, capsulitis,  adhesive.   Surgical History:   Past Surgical History:  Procedure Laterality Date   APPENDECTOMY     BRONCHIAL BIOPSY  05/09/2020   Procedure: BRONCHIAL BIOPSIES;  Surgeon: [name], DO;  Location: [place] [facility];  Service: Pulmonary;;   BRONCHIAL BRUSHINGS  05/09/2020   Procedure: BRONCHIAL BRUSHINGS;  Surgeon: [name], DO;  Location: [place] [facility];  Service: Pulmonary;;   BRONCHIAL NEEDLE ASPIRATION BIOPSY  05/09/2020   Procedure: BRONCHIAL NEEDLE ASPIRATION BIOPSIES;  Surgeon: [name], DO;  Location: [place] [facility];  Service: Pulmonary;;   BRONCHIAL WASHINGS  05/09/2020   Procedure: BRONCHIAL WASHINGS;  Surgeon: [name], DO;  Location: [place];  Service: Pulmonary;;   CARDIAC CATHETERIZATION N/A 04/05/2015   Procedure: Left Heart Cath and Coronary Angiography;  Surgeon: Sherren Mocha, MD;  Location: [place] [facility];  Service: Cardiovascular;  Laterality: N/A;   COLONOSCOPY     CORONARY ARTERY BYPASS GRAFT     w/LIMA to LAD, seq SVG to OM1 and OM2, SVG to PDA and SVG to Diagonal   CORONARY BALLOON ANGIOPLASTY N/A 01/29/2017   Procedure: Coronary Balloon  Angioplasty;  Surgeon: Martinique, Peter M, MD;  Location: [place] [facility];  Service: Cardiovascular;  Laterality: N/A;   FIDUCIAL MARKER PLACEMENT  05/09/2020   Procedure: FIDUCIAL MARKER PLACEMENT;  Surgeon: [name], DO;  Location: [place] [facility];  Service: Pulmonary;;   INTERCOSTAL NERVE BLOCK Left 05/22/2020   Procedure: INTERCOSTAL NERVE BLOCK;  Surgeon: Lajuana Matte, MD;  Location: [place];  Service: Thoracic;  Laterality: Left;   LEFT HEART CATH AND CORS/GRAFTS ANGIOGRAPHY N/A 01/29/2017   Procedure: Left Heart Cath and Cors/Grafts Angiography;  Surgeon: Martinique, Peter M, MD;  Location: [place] [facility];  Service: Cardiovascular;  Laterality: N/A;   NODE DISSECTION N/A 05/22/2020   Procedure: NODE DISSECTION;  Surgeon: Lajuana Matte, MD;  Location: [place];  Service: Thoracic;  Laterality: N/A;   VIDEO BRONCHOSCOPY WITH ENDOBRONCHIAL NAVIGATION N/A 05/09/2020   Procedure: VIDEO BRONCHOSCOPY WITH ENDOBRONCHIAL NAVIGATION;  Surgeon: [name], DO;  Location: [place];  Service: Pulmonary;  Laterality: N/A;   VIDEO BRONCHOSCOPY WITH ENDOBRONCHIAL ULTRASOUND N/A 05/09/2020   Procedure: VIDEO BRONCHOSCOPY WITH ENDOBRONCHIAL ULTRASOUND;  Surgeon: [name], DO;  Location: [place];  Service: Pulmonary;  Laterality: N/A;     Social History:   reports that he quit smoking about 39 years ago. His smoking use included cigarettes. He quit smokeless tobacco use about 36 years ago. He reports current alcohol use of about 20.0 - 28.0 standard drinks of alcohol per week. He reports that he does not use drugs.   Family History:  His family history includes Cancer in his sister; Dementia in his father; Heart disease in his father; Heart failure in his father; Lung cancer in his father and mother.   Allergies Allergies  Allergen Reactions   Amlodipine Swelling    Swelling of ankles     Home Medications  Prior to Admission medications   Medication Sig Start Date  End Date Taking? Authorizing Provider  aspirin 81 MG tablet Take 81 mg by mouth at bedtime.     [provider]  colchicine 0.6 MG tablet Take 1 tablet (0.6 mg total) by mouth every 8 (eight) hours as needed. 09/29/21   [name], MD  Cyanocobalamin (B-12 PO) Take 1,000 mcg by mouth daily.     [provider]  diphenoxylate-atropine (LOMOTIL) 2.5-0.025 MG tablet TAKE 2 TABLETS BY  MOUTH AT ONSET OF DIARRHEA THEN TAKE 1 TABLET AFTER EACH LOOSE STOOL MAX 8 TABLETS PER DAY 05/19/22   Celso Amy, NP  ELIQUIS 5 MG TABS tablet TAKE 1 TABLET BY MOUTH TWICE A DAY 06/27/22   [name], MD  ezetimibe (ZETIA) 10 MG tablet Take 1 tablet (10 mg total) by mouth daily. 10/15/21   Sueanne Margarita, MD  folic acid (FOLVITE) 1 MG tablet TAKE ONE TABLET BY MOUTH DAILY START 5 TO 7 DAYS PRIOR TO CHEMO AND CONTINUE UNTIL 21 DAYS AFTER COMPLETES 06/15/22   [name], MD  levothyroxine (SYNTHROID) 50 MCG tablet Take 1 tablet (50 mcg total) by mouth daily before breakfast. 06/17/22   [name], MD  metoprolol succinate (TOPROL-XL) 25 MG 24 hr tablet TAKE ONE TABLET BY MOUTH EVERY NIGHT AT BEDTIME 08/28/22   Sueanne Margarita, MD  Multiple Vitamin (MULTIVITAMIN) capsule Take 1 capsule by mouth daily.    [provider]  nitroGLYCERIN (NITROSTAT) 0.4 MG SL tablet Place 1 tablet (0.4 mg total) under the tongue every 5 (five) minutes as needed for chest pain. 06/03/21   Sueanne Margarita, MD  oxyCODONE-acetaminophen (PERCOCET) 7.5-325 MG tablet Take 1 tablet by mouth every 4 (four) hours as needed for severe pain. 09/09/21   [name], MD  pantoprazole (PROTONIX) 40 MG tablet Take 1 tablet (40 mg total) by mouth daily. 08/26/22   Nita Sells, MD  predniSONE (DELTASONE) 10 MG tablet Take 1 tablet (10 mg total) by mouth daily with breakfast. 06/17/22   [name], MD  predniSONE (DELTASONE) 20 MG tablet Take 3 tablets (60 mg total) by mouth daily with breakfast  for 5 days, THEN 2 tablets (40 mg total) daily with breakfast for 25 days. Continue 40 mg dose until discontinued by Physician.. 08/26/22 09/25/22  [name], MD  rosuvastatin (CRESTOR) 40 MG tablet TAKE ONE TABLET BY MOUTH EVERY NIGHT AT BEDTIME 06/16/22   Turner, Eber Hong, MD  sacubitril-valsartan (ENTRESTO) 24-26 MG TAKE ONE TABLET BY MOUTH TWICE A DAY 06/08/22   Sueanne Margarita, MD  sertraline (ZOLOFT) 100 MG tablet Take 100 mg by mouth 2 (two) times daily. 10/08/21   [provider]  sucralfate (CARAFATE) 1 g tablet Take 1 tablet (1 g total) by mouth 4 (four) times daily. Dissolve each tablet in 15 cc water before use. Patient taking differently: Take 1 g by mouth 4 (four) times daily as needed. Dissolve each tablet in 15 cc water before use. 10/24/21   Kyung Rudd, MD  traMADol (ULTRAM) 50 MG tablet Take 1 tablet (50 mg total) by mouth every 6 (six) hours as needed. 09/22/21   [name], MD  prochlorperazine (COMPAZINE) 10 MG tablet Take 1 tablet (10 mg total) by mouth every 6 (six) hours as needed (Nausea or vomiting). 06/24/20 04/25/21  [name], MD     Critical care time:      Georgann Housekeeper, AGACNP-BC [place] for personal pager PCCM on call pager [phone] until 7pm. Please call [place] 7p-7a. [phone]  09/21/2022 7:15 PM

## 2022-09-21 NOTE — ED Provider Notes (Signed)
[facility] Provider Note   CSN: [id number] Arrival date & time: 09/21/22  1309     History  Chief Complaint  Patient presents with   Shortness of Breath    John Hinton is a 77 y.o. male with past medical history of stage IV lung cancer, PE, A-fib who presents to the ED for shortness of breath.  Patient reports that he has had shortness of breath for at least the past 2 weeks.  He recently traveled to [place] by plane and believes that shortness of breath was secondary to altitude sickness, however, he returned 2 days ago and has remained short of breath since that time.  He reports no prior history of oxygen requirement.  EMS was called to patient's house today where he was found to be hypotensive and tachycardic and transported to the ED.  Per EMS report, patient was hypoxic in the 75s and placed on 2 L nasal cannula oxygen.  On arrival to the ED, patient with a normal oxygen saturation in the high 90s on room air.  Patient reports compliance with metoprolol and Eliquis which are used to treat his A-fib.  He is unable to identify any exacerbating or relieving factors of his shortness of breath.  He states he has never had significant shortness of breath before.  He denies chest pain, fever, chills, leg pain or swelling, abdominal pain, nausea, vomiting, or diarrhea.  He states he has had a mild dry cough but this is not that unusual for him with his history of lung cancer.  He also notes episode this morning where he felt more off balance than normal and stumbled into the wall.  Wife reports that he did fall to the ground and sustained a couple of abrasions to his lower extremities.  Wife at bedside states that patient had issues with his balance a few weeks ago and there was concern for paraneoplastic syndrome but she received the results of his outpatient lab work today and this was ruled out.       Home Medications Prior to Admission  medications   Medication Sig Start Date End Date Taking? Authorizing Provider  aspirin 81 MG tablet Take 81 mg by mouth at bedtime.     [provider]  colchicine 0.6 MG tablet Take 1 tablet (0.6 mg total) by mouth every 8 (eight) hours as needed. 09/29/21   [name], MD  Cyanocobalamin (B-12 PO) Take 1,000 mcg by mouth daily.     [provider]  diphenoxylate-atropine (LOMOTIL) 2.5-0.025 MG tablet TAKE 2 TABLETS BY MOUTH AT ONSET OF DIARRHEA THEN TAKE 1 TABLET AFTER EACH LOOSE STOOL MAX 8 TABLETS PER DAY 05/19/22   Celso Amy, NP  ELIQUIS 5 MG TABS tablet TAKE 1 TABLET BY MOUTH TWICE A DAY 06/27/22   [name], MD  ezetimibe (ZETIA) 10 MG tablet Take 1 tablet (10 mg total) by mouth daily. 10/15/21   Sueanne Margarita, MD  folic acid (FOLVITE) 1 MG tablet TAKE ONE TABLET BY MOUTH DAILY START 5 TO 7 DAYS PRIOR TO CHEMO AND CONTINUE UNTIL 21 DAYS AFTER COMPLETES 06/15/22   [name], MD  levothyroxine (SYNTHROID) 50 MCG tablet Take 1 tablet (50 mcg total) by mouth daily before breakfast. 06/17/22   [name], MD  metoprolol succinate (TOPROL-XL) 25 MG 24 hr tablet TAKE ONE TABLET BY MOUTH EVERY NIGHT AT BEDTIME 08/28/22   Sueanne Margarita, MD  Multiple  Vitamin (MULTIVITAMIN) capsule Take 1 capsule by mouth daily.    [provider]  nitroGLYCERIN (NITROSTAT) 0.4 MG SL tablet Place 1 tablet (0.4 mg total) under the tongue every 5 (five) minutes as needed for chest pain. 06/03/21   Sueanne Margarita, MD  oxyCODONE-acetaminophen (PERCOCET) 7.5-325 MG tablet Take 1 tablet by mouth every 4 (four) hours as needed for severe pain. 09/09/21   [name], MD  pantoprazole (PROTONIX) 40 MG tablet Take 1 tablet (40 mg total) by mouth daily. 08/26/22   Nita Sells, MD  predniSONE (DELTASONE) 10 MG tablet Take 1 tablet (10 mg total) by mouth daily with breakfast. 06/17/22   [name], MD  predniSONE (DELTASONE) 20 MG tablet Take 3 tablets (60  mg total) by mouth daily with breakfast for 5 days, THEN 2 tablets (40 mg total) daily with breakfast for 25 days. Continue 40 mg dose until discontinued by Physician.. 08/26/22 09/25/22  [name], MD  rosuvastatin (CRESTOR) 40 MG tablet TAKE ONE TABLET BY MOUTH EVERY NIGHT AT BEDTIME 06/16/22   Turner, Eber Hong, MD  sacubitril-valsartan (ENTRESTO) 24-26 MG TAKE ONE TABLET BY MOUTH TWICE A DAY 06/08/22   Sueanne Margarita, MD  sertraline (ZOLOFT) 100 MG tablet Take 100 mg by mouth 2 (two) times daily. 10/08/21   [provider]  sucralfate (CARAFATE) 1 g tablet Take 1 tablet (1 g total) by mouth 4 (four) times daily. Dissolve each tablet in 15 cc water before use. Patient taking differently: Take 1 g by mouth 4 (four) times daily as needed. Dissolve each tablet in 15 cc water before use. 10/24/21   Kyung Rudd, MD  traMADol (ULTRAM) 50 MG tablet Take 1 tablet (50 mg total) by mouth every 6 (six) hours as needed. 09/22/21   [name], MD  prochlorperazine (COMPAZINE) 10 MG tablet Take 1 tablet (10 mg total) by mouth every 6 (six) hours as needed (Nausea or vomiting). 06/24/20 04/25/21  [name], MD      Allergies    Amlodipine    Review of Systems   Review of Systems  All other systems reviewed and are negative.   Physical Exam Updated Vital Signs BP (!) 89/60   Pulse 87   Temp 98.7 F (37.1 C) (Oral)   Resp 20   Ht '5\' 11"'$  (1.803 m)   Wt 83.9 kg   SpO2 97%   BMI 25.80 kg/m  Physical Exam Vitals and nursing note reviewed.  Constitutional:      General: He is not in acute distress.    Appearance: Normal appearance. He is not ill-appearing, toxic-appearing or diaphoretic.  HENT:     Head: Normocephalic and atraumatic.     Mouth/Throat:     Mouth: Mucous membranes are moist.  Eyes:     Extraocular Movements: Extraocular movements intact.     Conjunctiva/sclera: Conjunctivae normal.     Pupils: Pupils are equal, round, and reactive to light.  Cardiovascular:      Rate and Rhythm: Tachycardia present. Rhythm irregular.     Heart sounds: No murmur heard. Pulmonary:     Effort: Pulmonary effort is normal.     Breath sounds: Decreased breath sounds (mildly throughout) present. No wheezing, rhonchi or rales.  Chest:     Chest wall: No mass, deformity, tenderness, crepitus or edema.  Abdominal:     General: Abdomen is flat.     Palpations: Abdomen is soft. There is no mass.  Tenderness: There is no abdominal tenderness. There is no guarding or rebound.  Musculoskeletal:        General: Normal range of motion.     Cervical back: Normal range of motion and neck supple.     Right lower leg: No tenderness. No edema.     Left lower leg: No tenderness. No edema.  Skin:    General: Skin is warm and dry.     Capillary Refill: Capillary refill takes less than 2 seconds.     Coloration: Skin is not pale.     Findings: No rash.  Neurological:     Mental Status: He is alert and oriented to person, place, and time.     GCS: GCS eye subscore is 4. GCS verbal subscore is 5. GCS motor subscore is 6.     Cranial Nerves: Cranial nerves 2-12 are intact. No cranial nerve deficit, dysarthria or facial asymmetry.     Motor: Motor function is intact. No weakness, tremor, atrophy or abnormal muscle tone.     Coordination: Coordination is intact.     Comments: 5/5 strength to bilateral UE and LE  Psychiatric:        Behavior: Behavior normal.     ED Results / Procedures / Treatments   Labs (all labs ordered are listed, but only abnormal results are displayed) Labs Reviewed  CBC WITH DIFFERENTIAL/PLATELET - Abnormal; Notable for the following components:      Result Value   RBC 3.57 (*)    Hemoglobin 11.6 (*)    HCT 36.4 (*)    MCV 102.0 (*)    Platelets 113 (*)    Lymphs Abs 0.2 (*)    All other components within normal limits  I-STAT VENOUS BLOOD GAS, ED - Abnormal; Notable for the following components:   pCO2, Ven 43.2 (*)    pO2, Ven 25 (*)     Potassium 6.1 (*)    Calcium, Ion 1.03 (*)    HCT 34.0 (*)    Hemoglobin 11.6 (*)    All other components within normal limits  I-STAT CREATININE, ED - Abnormal; Notable for the following components:   Creatinine, Ser 1.50 (*)    All other components within normal limits  RESP PANEL BY RT-PCR (RSV, FLU A&B, COVID)  [id number]  BASIC METABOLIC PANEL    EKG EKG Interpretation  Date/Time:  Monday September 21 2022 13:40:53 EST Ventricular Rate:  85 PR Interval:  189 QRS Duration: 154 QT Interval:  403 QTC Calculation: 480 R Axis:   -7 Text Interpretation: Sinus rhythm Atrial premature complex Right bundle branch block Confirmed by Pattricia Boss [phone]) on 09/21/2022 2:29:39 PM  Radiology DG Chest Port 1 View  Result Date: 09/21/2022 CLINICAL DATA:  Shortness of breath.  New oxygen requirement. EXAM: PORTABLE CHEST 1 VIEW COMPARISON:  09/08/2022. FINDINGS: 1343 hours. Unchanged small left pleural effusion and chronic volume loss in the left hemithorax. Unchanged nodular opacity in the right mid lung. No new airspace opacity. Stable cardiac and mediastinal contours. No pneumothorax. IMPRESSION: Unchanged small left pleural effusion. Unchanged nodular opacity in the right mid lung. Electronically Signed   By: Emmit Alexanders M.D.   On: 09/21/2022 13:52    Procedures .Critical Care  Performed by: Suzzette Righter, PA-C Authorized by: Suzzette Righter, PA-C   Critical care provider statement:    Critical care time (minutes):  45   Critical care start time:  09/21/2022 1:30 PM   Critical care end time:  09/21/2022  6:43 PM   Critical care time was exclusive of:  Separately billable procedures and treating other patients and teaching time   Critical care was necessary to treat or prevent imminent or life-threatening deterioration of the following conditions:  Cardiac failure, renal failure, circulatory failure, respiratory failure, dehydration and metabolic crisis   Critical care was time spent  personally by me on the following activities:  Development of treatment plan with patient or surrogate, discussions with consultants, evaluation of patient's response to treatment, examination of patient, ordering and review of laboratory studies, ordering and review of radiographic studies, ordering and performing treatments and interventions, pulse oximetry, re-evaluation of patient's condition, review of old charts and obtaining history from patient or surrogate   Care discussed with: admitting provider       Medications Ordered in ED Medications  diltiazem (CARDIZEM) 1 mg/mL load via infusion 5 mg (5 mg Intravenous Bolus from Bag 09/21/22 1743)    And  diltiazem (CARDIZEM) 125 mg in dextrose 5% 125 mL (1 mg/mL) infusion (5 mg/hr Intravenous New Bag/Given 09/21/22 1742)  sodium chloride 0.9 % bolus 1,000 mL (1,000 mLs Intravenous New Bag/Given 09/21/22 1507)  iohexol (OMNIPAQUE) 350 MG/ML injection 75 mL (75 mLs Intravenous Contrast Given 09/21/22 1725)    ED Course/ Medical Decision Making/ A&P                             Medical Decision Making Amount and/or Complexity of Data Reviewed Labs: ordered. Decision-making details documented in ED Course. Radiology: ordered. Decision-making details documented in ED Course. ECG/medicine tests: ordered. Decision-making details documented in ED Course.  Risk Prescription drug management. Decision regarding hospitalization.   Medical Decision Making:   Nyair Sampaio is a 77 y.o. male who presented to the ED today with shortness of breath detailed above.    Additional history discussed with patient's family/caregivers.  Patient's presentation is complicated by their history of lung cancer, pulmonary embolism, recent travel.  Patient placed on continuous vitals and telemetry monitoring while in ED which was reviewed periodically.  Complete initial physical exam performed, notably the patient was in no acute distress but was tachycardic with a  regular rhythm.  Patient was neurologically intact.  Slightly diminished breath sounds but patient in no acute respiratory distress.  No significant lower extremity edema or tenderness.  Remainder of exam benign. Reviewed and confirmed nursing documentation for past medical history, family history, social history.    Initial Assessment:   With the patient's presentation of shortness of breath, most likely diagnosis is pulmonary embolism. Differential diagnosis includes but is not limited to pneumonia, pleural effusion, ACS, viral syndrome, bronchitis, CVA/TIA, ICH, bronchitis, sepsis.  This is most consistent with an acute complicated illness  Initial Plan:  Screening labs including CBC and Metabolic panel to evaluate for infectious or metabolic etiology of disease.  CXR to evaluate for structural/infectious intrathoracic pathology.  EKG and troponins to evaluate for cardiac pathology Viral swabs Blood gas iStat for creatinine CT brain for evaluation of intracranial abnormality CT PE study Objective evaluation as reviewed   Initial Study Results:   Laboratory  All laboratory results reviewed without evidence of clinically relevant pathology.   Exceptions include: Hemoglobin 11.6, potassium 3.4, creatinine 1.43, troponin 89  EKG EKG was reviewed independently. ST segments without concerns for elevations.   EKG: Initial was atrial fibrillation with rapid ventricular response, repeat showed a normal sinus rhythm but this conversion was very brief before patient reconverted  back to A-fib with RVR  Radiology:  All images reviewed independently. Agree with radiology report at this time.   CT Angio Chest PE W and/or Wo Contrast  Result Date: 09/21/2022 CLINICAL DATA:  Shortness of breath. History of pulmonary embolism and lung cancer. EXAM: CT ANGIOGRAPHY CHEST WITH CONTRAST TECHNIQUE: Multidetector CT imaging of the chest was performed using the standard protocol during bolus administration  of intravenous contrast. Multiplanar CT image reconstructions and MIPs were obtained to evaluate the vascular anatomy. RADIATION DOSE REDUCTION: This exam was performed according to the departmental dose-optimization program which includes automated exposure control, adjustment of the mA and/or kV according to patient size and/or use of iterative reconstruction technique. CONTRAST:  55m OMNIPAQUE IOHEXOL 350 MG/ML SOLN COMPARISON:  PET-CT dated July 17, 2022. FINDINGS: Cardiovascular: Satisfactory opacification of the pulmonary arteries to the segmental level. Acute occlusive pulmonary emboli in the right interlobar pulmonary artery, right middle lobar and segmental pulmonary arteries, and right superior and basal segmental pulmonary arteries. Mild cardiomegaly with right heart enlargement. Elevated RV/LV ratio of 1.35. Prior CABG. No pericardial effusion. No thoracic aortic aneurysm or dissection. Coronary, aortic arch, and branch vessel atherosclerotic vascular disease. Mediastinum/Nodes: No enlarged mediastinal, hilar, or axillary lymph nodes. Unchanged calcified right hilar and subcarinal lymph nodes. Thyroid gland, trachea, and esophagus demonstrate no significant findings. Lungs/Pleura: Postsurgical changes from prior left upper lobectomy. Small subpleural opacity in the superior segment of the right upper lobe may reflect developing infarct or atelectasis. New mild right basilar subsegmental atelectasis. Unchanged calcified granuloma in the posterior right lower lobe. Unchanged small loculated left pleural effusion with significant irregular pleural thickening at the posterior costophrenic angle. Unchanged scarring in the posterior left lower lobe. Upper Abdomen: Enlarging left adrenal metastasis, currently 4.6 x 3.3 cm, previously nonmeasurable. Unchanged right adrenal metastasis. Musculoskeletal: Unchanged T1 and T10 sclerotic metastases. Review of the MIP images confirms the above findings.  IMPRESSION: 1. Acute occlusive lobar and segmental pulmonary emboli in the right middle and lower lobes. Positive for acute PE with CT evidence of right heart strain (RV/LV Ratio = 1.35 ) consistent with at least submassive (intermediate risk) PE. The presence of right heart strain has been associated with an increased risk of morbidity and mortality. Please refer to the "PE Focused" order set in EPIC. 2. Small subpleural opacity in the superior segment of the right upper lobe may reflect developing infarct or atelectasis. 3. Unchanged small loculated left pleural effusion with significant irregular pleural thickening at the posterior costophrenic angle, concerning for malignancy. 4. Enlarging left adrenal metastasis, currently 4.6 x 3.3 cm, previously non-measurable. Unchanged right adrenal metastasis. 5. Unchanged T1 and T10 sclerotic metastases. 6.  Aortic atherosclerosis (ICD10-I70.0). Critical Value/emergent results were called by telephone at the time of interpretation on 09/21/2022 at 5:59 pm to provider DAN FLOYD, who verbally acknowledged these results. Electronically Signed   By: WTitus DubinM.D.   On: 09/21/2022 18:03   CT Head Wo Contrast  Result Date: 09/21/2022 CLINICAL DATA:  Gait instability.  History of lung cancer. EXAM: CT HEAD WITHOUT CONTRAST TECHNIQUE: Contiguous axial images were obtained from the base of the skull through the vertex without intravenous contrast. RADIATION DOSE REDUCTION: This exam was performed according to the departmental dose-optimization program which includes automated exposure control, adjustment of the mA and/or kV according to patient size and/or use of iterative reconstruction technique. COMPARISON:  CT head dated August 19, 2022 FINDINGS: Brain: No evidence of acute infarction, hemorrhage, hydrocephalus, extra-axial collection or mass lesion/mass  effect. Low-attenuation of the periventricular white matter presumed chronic microvascular ischemic changes.  Vascular: No hyperdense vessel or unexpected calcification. Skull: Normal. Negative for fracture or focal lesion. Sinuses/Orbits: Partial opacification of the left maxillary sinus. Remaining paranasal sinuses and mastoid air cells are clear. Other: None. IMPRESSION: 1. No acute intracranial abnormality. 2. Chronic microvascular ischemic changes of the white matter. 3. Partial opacification of the left maxillary sinus. Electronically Signed   By: Keane Police D.O.   On: 09/21/2022 17:23   DG Chest Port 1 View  Result Date: 09/21/2022 CLINICAL DATA:  Shortness of breath.  New oxygen requirement. EXAM: PORTABLE CHEST 1 VIEW COMPARISON:  09/08/2022. FINDINGS: 1343 hours. Unchanged small left pleural effusion and chronic volume loss in the left hemithorax. Unchanged nodular opacity in the right mid lung. No new airspace opacity. Stable cardiac and mediastinal contours. No pneumothorax. IMPRESSION: Unchanged small left pleural effusion. Unchanged nodular opacity in the right mid lung. Electronically Signed   By: Emmit Alexanders M.D.   On: 09/21/2022 13:52   DG Chest 2 View  Result Date: 09/10/2022 CLINICAL DATA:  Shortness of breath. Non-small cell lung cancer, stage IV, left. EXAM: CHEST - 2 VIEW COMPARISON:  Chest radiograph 08/19/2022., PET CT 07/17/2022, spine MRI 2 in 24 FINDINGS: Prior median sternotomy. Chronic volume loss in the left hemithorax. Left pleural effusion has diminished from prior exam. Persistent blunting of left costophrenic angle and small volume pleural fluid and irregularity. No right pleural effusion. Stable heart size and mediastinal contours, aortic atherosclerosis. No evidence of acute airspace disease. No pulmonary edema. No pneumothorax. Known thoracic spine metastatic disease is not well delineated by radiograph. IMPRESSION: 1. Left pleural effusion has decreased in size from prior exam. Persistent pleural thickening and irregularity consistent with known malignancy. 2. Chronic  volume loss in the left hemithorax. Electronically Signed   By: Keith Rake M.D.   On: 09/10/2022 13:02      Consults: Case discussed with intensivist who will see patient.  Case discussed with hospitalist who will admit patient.  Final Assessment and Plan:   This is a 77 year old male with a past medical history of lung cancer and pulmonary embolism who presents to the ED for shortness of breath for the last 2 weeks.  Patient is currently undergoing chemotherapy treatment for his lung cancer.  He is anticoagulated on Eliquis and reports adherence to medication regimen at home.  He reports gradually worsening shortness of breath over the past 2 weeks.  He did recently travel by plane to [place] but symptoms began before this and have been persistent over the last 1 to 2 days since his return from his trip.  He has associated slight cough but otherwise no notable symptoms.  Patient transported to the ED by EMS today he reported that he had oxygen saturation in the 70s on room air and placed him on 2 L nasal cannula during transport.  On patient's arrival to the ED, he is with oxygen saturation high 90s on room air but is tachycardic and hypotensive.  Initial EKG shows A-fib with RVR.  Workup obtained as above.  Patient with persistent tachycardia so was given 1 L fluid bolus with slight improvement in hypotension but persistent tachycardia in the 110s.  Started on Cardizem with improvement in rate control.  Chest x-ray showed a small left pleural effusion on the left but this appears unchanged from previous imaging.  On multiple reexaminations, patient maintaining oxygen saturation in the high 90s and in no acute  respiratory distress.  He denies any chest pain repeatedly.  Workup obtained as above.  Delay in obtaining imaging due to delayed blood work and lack of kidney function to proceed with IV contrast.  Once this imaging able to be obtained, CT PE shows acute PE with right heart strain.  CT brain was  also obtained as patient reported some recent gait instability.  CT brain shows no acute findings.  With patient's hypotension and persistent A-fib with RVR in the setting of acute PE despite current anticoagulation, case was discussed with intensivist.  At this time, patient's blood pressure had improved to the 110s over 60s and patient had no oxygen requirement so intensivist agreeable to see patient in consult but at this time patient can be admitted to hospitalist service.  Discussed all findings with patient and wife at bedside who are agreeable with admission.  Case discussed with hospitalist who will admit patient.  Patient stable at time of admission.   Clinical Impression:  1. Other acute pulmonary embolism with acute cor pulmonale ([place])   2. Pleural effusion on left   3. Hypoxia   4. Other specified hypotension   5. Atrial fibrillation with RVR ([facility])      Data Unavailable           Final Clinical Impression(s) / ED Diagnoses Final diagnoses:  Pleural effusion on left  Hypoxia  Other acute pulmonary embolism with acute cor pulmonale ([facility])  Other specified hypotension  Atrial fibrillation with RVR [facility])    Rx / DC Orders ED Discharge Orders     None         Suzzette Righter, PA-C 09/21/22 1911    Pattricia Boss, MD 09/23/22 1437

## 2022-09-22 ENCOUNTER — Ambulatory Visit: Payer: Medicare Other | Admitting: Neurology

## 2022-09-22 ENCOUNTER — Observation Stay (HOSPITAL_COMMUNITY): Payer: Medicare Other

## 2022-09-22 DIAGNOSIS — J910 Malignant pleural effusion: Secondary | ICD-10-CM | POA: Diagnosis present

## 2022-09-22 DIAGNOSIS — I82813 Embolism and thrombosis of superficial veins of lower extremities, bilateral: Secondary | ICD-10-CM | POA: Diagnosis present

## 2022-09-22 DIAGNOSIS — I82412 Acute embolism and thrombosis of left femoral vein: Secondary | ICD-10-CM | POA: Diagnosis present

## 2022-09-22 DIAGNOSIS — I9589 Other hypotension: Secondary | ICD-10-CM | POA: Diagnosis present

## 2022-09-22 DIAGNOSIS — I824Z2 Acute embolism and thrombosis of unspecified deep veins of left distal lower extremity: Secondary | ICD-10-CM | POA: Diagnosis not present

## 2022-09-22 DIAGNOSIS — I4891 Unspecified atrial fibrillation: Secondary | ICD-10-CM | POA: Diagnosis not present

## 2022-09-22 DIAGNOSIS — Y92009 Unspecified place in unspecified non-institutional (private) residence as the place of occurrence of the external cause: Secondary | ICD-10-CM | POA: Diagnosis not present

## 2022-09-22 DIAGNOSIS — R410 Disorientation, unspecified: Secondary | ICD-10-CM | POA: Diagnosis not present

## 2022-09-22 DIAGNOSIS — Z79899 Other long term (current) drug therapy: Secondary | ICD-10-CM | POA: Diagnosis not present

## 2022-09-22 DIAGNOSIS — J9601 Acute respiratory failure with hypoxia: Secondary | ICD-10-CM | POA: Diagnosis present

## 2022-09-22 DIAGNOSIS — D649 Anemia, unspecified: Secondary | ICD-10-CM | POA: Diagnosis present

## 2022-09-22 DIAGNOSIS — Z1152 Encounter for screening for COVID-19: Secondary | ICD-10-CM | POA: Diagnosis not present

## 2022-09-22 DIAGNOSIS — I2699 Other pulmonary embolism without acute cor pulmonale: Secondary | ICD-10-CM

## 2022-09-22 DIAGNOSIS — I82409 Acute embolism and thrombosis of unspecified deep veins of unspecified lower extremity: Secondary | ICD-10-CM

## 2022-09-22 DIAGNOSIS — W1830XA Fall on same level, unspecified, initial encounter: Secondary | ICD-10-CM | POA: Diagnosis present

## 2022-09-22 DIAGNOSIS — E785 Hyperlipidemia, unspecified: Secondary | ICD-10-CM | POA: Diagnosis present

## 2022-09-22 DIAGNOSIS — E039 Hypothyroidism, unspecified: Secondary | ICD-10-CM | POA: Diagnosis present

## 2022-09-22 DIAGNOSIS — I2602 Saddle embolus of pulmonary artery with acute cor pulmonale: Secondary | ICD-10-CM

## 2022-09-22 DIAGNOSIS — I5022 Chronic systolic (congestive) heart failure: Secondary | ICD-10-CM | POA: Diagnosis present

## 2022-09-22 DIAGNOSIS — I420 Dilated cardiomyopathy: Secondary | ICD-10-CM | POA: Diagnosis present

## 2022-09-22 DIAGNOSIS — I2609 Other pulmonary embolism with acute cor pulmonale: Secondary | ICD-10-CM | POA: Diagnosis present

## 2022-09-22 DIAGNOSIS — C7951 Secondary malignant neoplasm of bone: Secondary | ICD-10-CM | POA: Diagnosis present

## 2022-09-22 DIAGNOSIS — E861 Hypovolemia: Secondary | ICD-10-CM | POA: Diagnosis not present

## 2022-09-22 DIAGNOSIS — I480 Paroxysmal atrial fibrillation: Secondary | ICD-10-CM | POA: Diagnosis present

## 2022-09-22 DIAGNOSIS — C7801 Secondary malignant neoplasm of right lung: Secondary | ICD-10-CM | POA: Diagnosis present

## 2022-09-22 DIAGNOSIS — C3412 Malignant neoplasm of upper lobe, left bronchus or lung: Secondary | ICD-10-CM | POA: Diagnosis present

## 2022-09-22 DIAGNOSIS — C7972 Secondary malignant neoplasm of left adrenal gland: Secondary | ICD-10-CM | POA: Diagnosis present

## 2022-09-22 DIAGNOSIS — C7971 Secondary malignant neoplasm of right adrenal gland: Secondary | ICD-10-CM | POA: Diagnosis present

## 2022-09-22 DIAGNOSIS — J90 Pleural effusion, not elsewhere classified: Secondary | ICD-10-CM | POA: Diagnosis not present

## 2022-09-22 DIAGNOSIS — N179 Acute kidney failure, unspecified: Secondary | ICD-10-CM | POA: Diagnosis present

## 2022-09-22 DIAGNOSIS — Z66 Do not resuscitate: Secondary | ICD-10-CM | POA: Diagnosis present

## 2022-09-22 DIAGNOSIS — D696 Thrombocytopenia, unspecified: Secondary | ICD-10-CM | POA: Diagnosis present

## 2022-09-22 DIAGNOSIS — C3490 Malignant neoplasm of unspecified part of unspecified bronchus or lung: Secondary | ICD-10-CM | POA: Diagnosis not present

## 2022-09-22 DIAGNOSIS — E876 Hypokalemia: Secondary | ICD-10-CM | POA: Diagnosis present

## 2022-09-22 DIAGNOSIS — C3492 Malignant neoplasm of unspecified part of left bronchus or lung: Secondary | ICD-10-CM | POA: Diagnosis not present

## 2022-09-22 DIAGNOSIS — I110 Hypertensive heart disease with heart failure: Secondary | ICD-10-CM | POA: Diagnosis present

## 2022-09-22 DIAGNOSIS — Z79890 Hormone replacement therapy (postmenopausal): Secondary | ICD-10-CM | POA: Diagnosis not present

## 2022-09-22 LAB — CBC
HCT: 30.8 % — ABNORMAL LOW (ref 39.0–52.0)
Hemoglobin: 10.6 g/dL — ABNORMAL LOW (ref 13.0–17.0)
MCH: 33.5 pg (ref 26.0–34.0)
MCHC: 34.4 g/dL (ref 30.0–36.0)
MCV: 97.5 fL (ref 80.0–100.0)
Platelets: 89 10*3/uL — ABNORMAL LOW (ref 150–400)
RBC: 3.16 MIL/uL — ABNORMAL LOW (ref 4.22–5.81)
RDW: 14.7 % (ref 11.5–15.5)
WBC: 5.4 10*3/uL (ref 4.0–10.5)
nRBC: 0 % (ref 0.0–0.2)

## 2022-09-22 LAB — COMPREHENSIVE METABOLIC PANEL
ALT: 13 U/L (ref 0–44)
AST: 16 U/L (ref 15–41)
Albumin: 3 g/dL — ABNORMAL LOW (ref 3.5–5.0)
Alkaline Phosphatase: 40 U/L (ref 38–126)
Anion gap: 8 (ref 5–15)
BUN: 25 mg/dL — ABNORMAL HIGH (ref 8–23)
CO2: 20 mmol/L — ABNORMAL LOW (ref 22–32)
Calcium: 7.8 mg/dL — ABNORMAL LOW (ref 8.9–10.3)
Chloride: 111 mmol/L (ref 98–111)
Creatinine, Ser: 1.09 mg/dL (ref 0.61–1.24)
GFR, Estimated: >60 mL/min (ref >60)
Glucose, Bld: 132 mg/dL — ABNORMAL HIGH (ref 70–99)
Potassium: 3.7 mmol/L (ref 3.5–5.1)
Sodium: 139 mmol/L (ref 135–145)
Total Bilirubin: 1 mg/dL (ref 0.3–1.2)
Total Protein: 5.4 g/dL — ABNORMAL LOW (ref 6.5–8.1)

## 2022-09-22 LAB — URINALYSIS, COMPLETE (UACMP) WITH MICROSCOPIC
Bacteria, UA: NONE SEEN
Bilirubin Urine: NEGATIVE
Glucose, UA: NEGATIVE mg/dL
Ketones, ur: 20 mg/dL — AB
Leukocytes,Ua: NEGATIVE
Nitrite: NEGATIVE
Protein, ur: NEGATIVE mg/dL
Specific Gravity, Urine: 1.045 — ABNORMAL HIGH (ref 1.005–1.030)
pH: 5 (ref 5.0–8.0)

## 2022-09-22 LAB — APTT
aPTT: 186 seconds (ref 24–36)
aPTT: 75 seconds — ABNORMAL HIGH (ref 24–36)

## 2022-09-22 LAB — ECHOCARDIOGRAM COMPLETE
AR max vel: 1.83 cm2
AV Area VTI: 1.93 cm2
AV Area mean vel: 1.7 cm2
AV Mean grad: 5.3 mmHg
AV Peak grad: 9.2 mmHg
Ao pk vel: 1.52 m/s
Area-P 1/2: 2.28 cm2
Calc EF: 51.2 %
Height: 71 in
S' Lateral: 3.9 cm
Single Plane A2C EF: 51 %
Single Plane A4C EF: 51.3 %
Weight: 2920.65 oz

## 2022-09-22 LAB — MAGNESIUM: Magnesium: 2.6 mg/dL — ABNORMAL HIGH (ref 1.7–2.4)

## 2022-09-22 LAB — PREALBUMIN: Prealbumin: 17 mg/dL — ABNORMAL LOW (ref 18–38)

## 2022-09-22 LAB — OSMOLALITY, URINE: Osmolality, Ur: 794 mOsm/kg (ref 300–900)

## 2022-09-22 LAB — PHOSPHORUS: Phosphorus: 2.7 mg/dL (ref 2.5–4.6)

## 2022-09-22 LAB — CREATININE, URINE, RANDOM: Creatinine, Urine: 119 mg/dL

## 2022-09-22 LAB — HEPARIN LEVEL (UNFRACTIONATED): Heparin Unfractionated: >1.1 IU/mL — ABNORMAL HIGH (ref 0.30–0.70)

## 2022-09-22 LAB — MRSA NEXT GEN BY PCR, NASAL: MRSA by PCR Next Gen: NOT DETECTED

## 2022-09-22 LAB — SODIUM, URINE, RANDOM: Sodium, Ur: 105 mmol/L

## 2022-09-22 MED ORDER — HEPARIN (PORCINE) 25000 UT/250ML-% IV SOLN
1400.0000 [IU]/h | INTRAVENOUS | Status: DC
  Administered 2022-09-22 – 2022-09-23 (×2): 1100 [IU]/h via INTRAVENOUS
  Administered 2022-09-25: 1400 [IU]/h via INTRAVENOUS
  Filled 2022-09-22 (×4): qty 250

## 2022-09-22 NOTE — Assessment & Plan Note (Signed)
-   Duplex performed on admission given PE - Left lower extremity noted with partial nonocclusive acute DVT involving distal left femoral vein - Also found to have LEFT acute superficial venous thrombosis involving left small saphenous vein and RIGHT acute superficial venous thrombosis involving right small saphenous vein - continue anticoagulation

## 2022-09-22 NOTE — Assessment & Plan Note (Signed)
-   Possibly due to volume depletion with underlying AKI however may have also had some contribution from clot burden due to PE - He has been on prednisone outpatient for ataxia in setting of immunotherapy -Due to significant hypotension on admission, he was started on stress dose steroids with Solu-Cortef.  Continue through today then can try to start weaning if blood pressure remaining stable

## 2022-09-22 NOTE — Assessment & Plan Note (Signed)
-   Replete as needed 

## 2022-09-22 NOTE — Progress Notes (Signed)
ANTICOAGULATION CONSULT NOTE - Follow-Up Consult  Pharmacy Consult for heparin infusion Indication: atrial fibrillation and acute pulmonary embolus  Allergies  Allergen Reactions   Amlodipine Swelling    Swelling of ankles    Patient Measurements: Height: '5\' 11"'$  (180.3 cm) Weight: 82.8 kg (182 lb 8.7 oz) IBW/kg (Calculated) : 75.3 Heparin Dosing Weight: 82.8 kg  Vital Signs: Temp: 98 F (36.7 C) (03/05 0255) Temp Source: Oral (03/05 0255) BP: 96/60 (03/05 0255) Pulse Rate: 65 (03/05 0255)  Labs: Recent Labs    09/21/22 1348 09/21/22 1402 09/21/22 1457 09/21/22 1539 09/21/22 1751 09/21/22 1940 09/21/22 1946 09/21/22 2128  HGB 11.6* 11.6*  --   --   --   --   --   --   HCT 36.4* 34.0*  --   --   --   --   --   --   PLT 113*  --   --   --   --   --   --   --   APTT  --   --   --   --   --  44*  --   --   LABPROT  --   --   --   --   --  20.4*  --   --   INR  --   --   --   --   --  1.8*  --   --   HEPARINUNFRC  --   --   --   --   --  >1.10*  --   --   CREATININE  --  1.50* 1.43*  --   --   --   --   --   CKTOTAL  --   --   --   --   --  54  --   --   TROPONINIHS  --   --   --    < > 94*  --  83* 83*   < > = values in this interval not displayed.     Estimated Creatinine Clearance: 48.3 mL/min (A) (by C-G formula based on SCr of 1.43 mg/dL (H)).   Medical History: Past Medical History:  Diagnosis Date   BPH (benign prostatic hypertrophy)    Bradycardia    a. H/o asymptomatic bradycardia.   Coronary artery disease    a. s/p CABG ~2007 with LIMA to LAD, SVG seq to OM1 and OM2, SVG to PDA and SVG to diag. b. Abnormal nuc 03/2015 - s/p DES to SVG-OM1-OM2. 01/29/17 POBA with cutting balloon to SVG-->OM.  No ischemia Lexi myoview 2021   DCM (dilated cardiomyopathy) ([place]) 09/17/2015   mixed by cMRI 03/2021 with EF 39% with non viable infarct in basal to mid lateral wall and LGE pattern c/w nonischemic component as well. Mild RV dysfunction RVEF 42%.   Dyslipidemia     Dyspnea    r/t lung mass   ED (erectile dysfunction)    Fear of heights    Goals of care, counseling/discussion 06/19/2020   History of radiation therapy    T spine and left ilium 10/07/2021-10/20/2021  Dr Gery Pray   Hypertension    Non-small cell carcinoma of lung, stage 4, left ([place]) 06/19/2020   OSA (obstructive sleep apnea) 07/12/2015   Moderate OSA with AHI 16.5/hr now on CPAP at 8cm H2O, uses cpap nightly      RBBB    Shoulder, capsulitis, adhesive    Left Shoulder    Medications:  Medications  Prior to Admission  Medication Sig Dispense Refill Last Dose   aspirin 81 MG tablet Take 81 mg by mouth at bedtime.       colchicine 0.6 MG tablet Take 1 tablet (0.6 mg total) by mouth every 8 (eight) hours as needed. 60 tablet 1    Cyanocobalamin (B-12 PO) Take 1,000 mcg by mouth daily.       diphenoxylate-atropine (LOMOTIL) 2.5-0.025 MG tablet TAKE 2 TABLETS BY MOUTH AT ONSET OF DIARRHEA THEN TAKE 1 TABLET AFTER EACH LOOSE STOOL MAX 8 TABLETS PER DAY 100 tablet 0    ELIQUIS 5 MG TABS tablet TAKE 1 TABLET BY MOUTH TWICE A DAY 60 tablet 3    ezetimibe (ZETIA) 10 MG tablet Take 1 tablet (10 mg total) by mouth daily. 90 tablet 3    folic acid (FOLVITE) 1 MG tablet TAKE ONE TABLET BY MOUTH DAILY START 5 TO 7 DAYS PRIOR TO CHEMO AND CONTINUE UNTIL 21 DAYS AFTER COMPLETES 90 tablet 3    levothyroxine (SYNTHROID) 50 MCG tablet Take 1 tablet (50 mcg total) by mouth daily before breakfast. 30 tablet 3    metoprolol succinate (TOPROL-XL) 25 MG 24 hr tablet TAKE ONE TABLET BY MOUTH EVERY NIGHT AT BEDTIME 90 tablet 0    Multiple Vitamin (MULTIVITAMIN) capsule Take 1 capsule by mouth daily.      nitroGLYCERIN (NITROSTAT) 0.4 MG SL tablet Place 1 tablet (0.4 mg total) under the tongue every 5 (five) minutes as needed for chest pain. 25 tablet 3    oxyCODONE-acetaminophen (PERCOCET) 7.5-325 MG tablet Take 1 tablet by mouth every 4 (four) hours as needed for severe pain. 60 tablet 0    pantoprazole  (PROTONIX) 40 MG tablet Take 1 tablet (40 mg total) by mouth daily. 30 tablet 3    predniSONE (DELTASONE) 10 MG tablet Take 1 tablet (10 mg total) by mouth daily with breakfast. 30 tablet 4    predniSONE (DELTASONE) 20 MG tablet Take 3 tablets (60 mg total) by mouth daily with breakfast for 5 days, THEN 2 tablets (40 mg total) daily with breakfast for 25 days. Continue 40 mg dose until discontinued by Physician.. 65 tablet 0    rosuvastatin (CRESTOR) 40 MG tablet TAKE ONE TABLET BY MOUTH EVERY NIGHT AT BEDTIME 90 tablet 1    sacubitril-valsartan (ENTRESTO) 24-26 MG TAKE ONE TABLET BY MOUTH TWICE A DAY 60 tablet 9    sertraline (ZOLOFT) 100 MG tablet Take 100 mg by mouth 2 (two) times daily.      sucralfate (CARAFATE) 1 g tablet Take 1 tablet (1 g total) by mouth 4 (four) times daily. Dissolve each tablet in 15 cc water before use. (Patient taking differently: Take 1 g by mouth 4 (four) times daily as needed. Dissolve each tablet in 15 cc water before use.) 120 tablet 2    traMADol (ULTRAM) 50 MG tablet Take 1 tablet (50 mg total) by mouth every 6 (six) hours as needed. 90 tablet 0    Scheduled:   aspirin  81 mg Oral QHS   hydrocortisone sod succinate (SOLU-CORTEF) inj  100 mg Intravenous Q8H   levothyroxine  50 mcg Oral QAC breakfast   pantoprazole  40 mg Oral Daily   rosuvastatin  40 mg Oral QHS   sertraline  100 mg Oral BID   Infusions:   sodium chloride 75 mL/hr at 09/21/22 2140   heparin 1,350 Units/hr (09/21/22 1951)    Assessment: Pt with a hx of PE in 10/22 presented with  a new PE. He has been on apixaban '5mg'$  PO BID for AF, concerning for apixaban failure. Last dose of apixaban was 09/21/22 AM. He has a hx of stage 4 lung CA. Pharmacy consulted to dose and manage heparin infusion for treatment of acute PE and AFib  aPTT 186, supratherapeutic Heparin level >1.1, supratherapeutic Current heparin infusion rate: 1350 units/hr  Hgb trending down to 10.6 Plt trending down from 113 to 89   Likely dilutional drops in CBC No s/sx of bleeding reported by RN overnight.   Goal of Therapy:  Heparin level 0.3-0.7 units/ml aPTT: 66-102 Monitor platelets by anticoagulation protocol: Yes   Plan:  Hold heparin infusion for 1 hour, then Decrease heparin infusion rate to 1100 units/hr Repeat aPTT in 8 hours Monitor daily CBC, aPTT (until heparin levels are correlating), heparin level, and for s/sx of bleeding F/u long-term anticoagulation plan   Luisa Hart, PharmD, BCPS Clinical Pharmacist 09/22/2022 7:51 AM   Please refer to [facility] for pharmacy phone number

## 2022-09-22 NOTE — Evaluation (Signed)
Physical Therapy Evaluation Patient Details Name: John Hinton MRN: [id number] DOB: October 06, 1945 Today's Date: 09/22/2022  History of Present Illness  77 y.o. male with medical history significant of lung cancer, A-fib with RVR, PE, systolic CHF EF [id number], gout, hypothyroidism, GERD, paraneoplastic syndrome.  Admitted for   Pleural effusion on left, acute pulmonary embolism with acute cor pulmonale.  Clinical Impression  PTA pt living with his wife in a two story home with steps to enter. Pt poor historian and wife provides most of the information about home set up and PLOF. Wife reports his cognition has been getting worse over the last couple days. Pt was independent with mobility and ADLs, iADLs, travelling to [place], [place] 2 weeks ago. Pt is currently limited in safe mobility by decreased cognition in particular command follow and safety awareness, in addition to increased O2 demand with activity and decreased strength and balance. Pt is min guard for bed mobility and min A for transfers. With start of ambulation pt is min A for management of lines and leads, pt progressively needs increased assist with due to increased posterior lean progressing to retropulsion, eventually needing wheelchair transfer back to bed. Pt started ambulation with SpO2 100% on RA. Pt with poor pleth waveform with ambulation but when waveform present SpO2 85%O2. Provided 3L O2 via [place] and new SpO2 probe, SpO2 100%O2 when seated in wheelchair. PT recommending AIR level rehab at discharge to improve strength, balance and safety awareness. Wife aware that pt will need constant supervision while pt dealing with cognitive deficits and is working to arrange such care. PT will continue to follow acutely.        Recommendations for follow up therapy are one component of a multi-disciplinary discharge planning process, led by the attending physician.  Recommendations may be updated based on patient status, additional functional criteria and  insurance authorization.  Follow Up Recommendations Acute inpatient rehab (3hours/day)      Assistance Recommended at Discharge Frequent or constant Supervision/Assistance  Patient can return home with the following  A lot of help with walking and/or transfers;A lot of help with bathing/dressing/bathroom;Assistance with cooking/housework;Direct supervision/assist for medications management;Direct supervision/assist for financial management;Assist for transportation;Help with stairs or ramp for entrance    Equipment Recommendations BSC/3in1     Functional Status Assessment Patient has had a recent decline in their functional status and demonstrates the ability to make significant improvements in function in a reasonable and predictable amount of time.     Precautions / Restrictions Precautions Precautions: Fall Precaution Comments: h/o falls (last fell at home 09/21/22) per pt spouse Restrictions Weight Bearing Restrictions: No      Mobility  Bed Mobility Overal bed mobility: Needs Assistance Bed Mobility: Supine to Sit, Sit to Supine     Supine to sit: HOB elevated, Min guard     General bed mobility comments: min guard for safety, increased time and effort to come to EoB, vc for sequening to get hips to EoB    Transfers Overall transfer level: Needs assistance Equipment used: Rolling walker (2 wheels) Transfers: Sit to/from Stand Sit to Stand: Min assist, From elevated surface           General transfer comment: multimodal cuing for hand placement to safely use RW, good power up but requires min A for steadying due to posterior lean    Ambulation/Gait Ambulation/Gait assistance: Min assist, Mod assist, Max assist, Total assist Gait Distance (Feet): 60 Feet Assistive device: Rolling walker (2 wheels) Gait Pattern/deviations: Step-through pattern,  Decreased step length - right, Decreased step length - left, Shuffle, Drifts right/left, Narrow base of support, Leaning  posteriorly Gait velocity: variable Gait velocity interpretation: <1.8 ft/sec, indicate of risk for recurrent falls   General Gait Details: pt initiates gain with light min A for safety, as ambulation progresses pt with decreasing BoS, decreasing command follow, increasing posterior lean, increased DoE, and O2 demand, ultimately needs total A for standing due to posterior lean until wheelchair placed behind him      Balance Overall balance assessment: Needs assistance, History of Falls Sitting-balance support: Feet supported, Feet unsupported Sitting balance-Leahy Scale: Poor Sitting balance - Comments: Required Min-Min guard assist when sitting up at EOB (posterior lean noted off and on) Postural control: Posterior lean Standing balance support: Bilateral upper extremity supported, Reliant on assistive device for balance Standing balance-Leahy Scale: Poor Standing balance comment: Relaint on bilateral UE support                             Pertinent Vitals/Pain Pain Assessment Pain Assessment: No/denies pain    Home Living Family/patient expects to be discharged to:: Private residence Living Arrangements: Spouse/significant other Available Help at Discharge: Family;Available 24 hours/day Type of Home: House Home Access: Stairs to enter Entrance Stairs-Rails: None Entrance Stairs-Number of Steps: 4 Alternate Level Stairs-Number of Steps: Original log cabin house with multiple additions. One step to transition between each addition. Home Layout: Two level Home Equipment: Shower seat - built Medical sales representative (2 wheels)      Prior Function Prior Level of Function : Independent/Modified Independent             Mobility Comments: Recently using RW to get into hospital secondary to decreased balance ADLs Comments: wife recently assisting with ADL's over last 4 days, changes in memory     Hand Dominance   Dominant Hand: Right    Extremity/Trunk Assessment    Upper Extremity Assessment Upper Extremity Assessment: Defer to OT evaluation    Lower Extremity Assessment Lower Extremity Assessment: RLE deficits/detail;LLE deficits/detail RLE Deficits / Details: R hip and knee ROM limited by stiffness from fall RLE Coordination: decreased fine motor;decreased gross motor LLE Coordination: decreased fine motor;decreased gross motor       Communication   Communication: No difficulties  Cognition Arousal/Alertness: Awake/alert Behavior During Therapy: Flat affect, WFL for tasks assessed/performed Overall Cognitive Status: Impaired/Different from baseline Area of Impairment: Memory, Following commands, Awareness, Problem solving                     Memory: Decreased short-term memory Following Commands: Follows one step commands with increased time   Awareness: Emergent Problem Solving: Slow processing, Decreased initiation, Difficulty sequencing, Requires verbal cues General Comments: Pt's wife is hopeful that the decreased cognition with pass once he has some steroids. Pt has increased lack of safety awareness, limited knowledge of DME use, increased difficulty with command follow all limiting his safe mobility.        General Comments General comments (skin integrity, edema, etc.): SpO2 on RA 100% prior with initial ambulation, multiple probes tried due to poor pleth wave form when reading SpO2 85% on RA, able to rebound to 100%O2 on 3L O2 via [place], reduced to 2L O2 and able to maintain SpOw >96%O2, Pt with skinned R knee and R flank pain where he fell,        Assessment/Plan    PT Assessment Patient needs continued PT services  PT Problem List Decreased strength;Decreased activity tolerance;Decreased balance;Decreased mobility;Decreased cognition;Decreased coordination;Decreased knowledge of use of DME;Decreased safety awareness;Cardiopulmonary status limiting activity       PT Treatment Interventions DME instruction;Gait  training;Stair training;Functional mobility training;Therapeutic activities;Therapeutic exercise;Balance training;Cognitive remediation;Patient/family education    PT Goals (Current goals can be found in the Care Plan section)  Acute Rehab PT Goals PT Goal Formulation: With patient/family Time For Goal Achievement: 10/06/22 Potential to Achieve Goals: Fair    Frequency Min 3X/week        AM-PAC PT "6 Clicks" Mobility  Outcome Measure Help needed turning from your back to your side while in a flat bed without using bedrails?: None Help needed moving from lying on your back to sitting on the side of a flat bed without using bedrails?: A Little Help needed moving to and from a bed to a chair (including a wheelchair)?: A Little Help needed standing up from a chair using your arms (e.g., wheelchair or bedside chair)?: A Little Help needed to walk in hospital room?: A Little Help needed climbing 3-5 steps with a railing? : Total 6 Click Score: 17    End of Session Equipment Utilized During Treatment: Gait belt Activity Tolerance: Patient tolerated treatment well Patient left: in chair;with call bell/phone within reach;with chair alarm set;with family/visitor present (MD in room) Nurse Communication: Mobility status;Other (comment) (oxygen demand in ambulation) PT Visit Diagnosis: Unsteadiness on feet (R26.81);History of falling (Z91.81);Muscle weakness (generalized) (M62.81);Difficulty in walking, not elsewhere classified (R26.2)    Time: [id number] PT Time Calculation (min) (ACUTE ONLY): 34 min   Charges:   PT Evaluation $PT Eval Moderate Complexity: 1 Mod PT Treatments $Gait Training: 8-22 mins        Jari Dipasquale B. Migdalia Dk PT, DPT Acute Rehabilitation Services Please use secure chat or  Call Office [phone]   [place] 09/22/2022, 3:02 PM

## 2022-09-22 NOTE — Assessment & Plan Note (Signed)
-   appears has had extensive workup outpatient recently with paraneoplastic workup and LP which has been negative - MRI brain has also been performed on 08/11/2022 which was also negative for acute abnormalities nor any metastatic disease - Cognition and ataxia felt to possibly be related to his immunotherapy

## 2022-09-22 NOTE — Assessment & Plan Note (Signed)
-   baseline creatinine ~ 1.1 - patient presents with increase in creat >0.3 mg/dL above baseline, creat increase >1.5x baseline presumed to have occurred within past 7 days PTA -1.5 on admission, suspected prerenal - Fluid resuscitated in setting of hypotension as well -Renal function has improved back to baseline this morning

## 2022-09-22 NOTE — Assessment & Plan Note (Signed)
-   TSH normal, 1.238 - Continue Synthroid

## 2022-09-22 NOTE — Assessment & Plan Note (Signed)
-   No signs or symptoms of exacerbation - Toprol and Entresto on hold in setting of hypotension

## 2022-09-22 NOTE — Assessment & Plan Note (Signed)
-   no known prior history; suspect precipitated from acute stress/illness of PE - s/p diltiazem in ER but BP did not tolerate - on heparin now for anticoagulation - has converted back to NSR when seen this am - rate currently controlled; hold off on further treatment unless coverts back to afib

## 2022-09-22 NOTE — Progress Notes (Signed)
SATURATION QUALIFICATIONS: (This note is used to comply with regulatory documentation for home oxygen)  Patient Saturations on Room Air at Rest = 100%  Patient Saturations on Room Air while Ambulating = 85%  Patient Saturations on 3 Liters of oxygen while Ambulating = 100%  Please briefly explain why patient needs home oxygen:  Pt requires supplemental oxygen in order to safely ambulate and maintain safe SpO2 level when he is having increased DoE.  Gillian Meeuwsen B. Migdalia Dk PT, DPT Acute Rehabilitation Services Please use secure chat or  Call Office [phone]

## 2022-09-22 NOTE — Progress Notes (Signed)
Echocardiogram 2D Echocardiogram has been performed.  John Hinton 09/22/2022, 8:48 AM

## 2022-09-22 NOTE — Assessment & Plan Note (Signed)
-   Due to ongoing ataxia and weakness/deconditioning - PT/OT following - Possibly CIR candidate, follow-up workup

## 2022-09-22 NOTE — Assessment & Plan Note (Signed)
-   Continue aspirin, Crestor -Entresto and Toprol on hold in setting of hypotension

## 2022-09-22 NOTE — Progress Notes (Signed)
Progress Note    John Hinton   [id number]  DOB: 22-Apr-1946  DOA: 09/21/2022     0 PCP: Corliss Blacker, MD  Initial CC: SOB  Hospital Course: John Hinton is a 77 yo male with PMH Stage IV adenocarcinoma LUL s/p resection (mets to right lung, B/L adrenals, lumbar and iliac bones). Follows with Dr. Marin Olp and has been on Xgeva q18month and ongoing Eliquis for history of PE.  He has had some cognitive impairment and has been evaluated by neurology recently and also undergone paraneoplastic workup and infectious workup with LP. This appears negative and that his cognition may be due to immunotherapy per oncology. Due to increase in unsteadiness he underwent recent MRI brain on 08/11/2022.  This was negative for acute abnormalities nor any evidence of metastatic disease.  For this hospitalization, he presented with worsening shortness of breath for approximately 2 weeks.  [place] was feeling short of breath which did not seem to improve throughout the trip and then worsened again upon returning back home. CTA chest was performed which showed acute occlusive lobar and segmental PE in the right middle and lower lobes.  He was also noted to be hypotensive, in A-fib with RVR, and hypoxic on admission. Pulmonology was consulted.  He was not felt to immediately warrant lytic therapy but if were to become hemodynamically unstable then could be further considered with understanding of increased bleeding risk. He was started on a heparin drip on admission and Eliquis was held.  Interval History:  Seen this morning in his room with wife present bedside.  Patient has obvious cognitive impairment but can answer questions easily, just difficulty with recalling past events.  Breathing has improved since admission and he was actually on room air at rest.  He ambulated in the hall with PT and was significantly short of breath as  expected.  Assessment and Plan: * [facility] - Suspected provoked in setting of recent airline travel and also underlying malignancy.  Known history of prior PE as well - Has been on Eliquis at home prior to admission; need to confirm compliance  - continue heparin drip; transition to Lovenox when okay with oncology - appreciate pulmonology consult; no need for lytics at this time unless were to be hemodynamically unstable  - follow up echo   DVT (deep venous thrombosis) (HCC) - Duplex performed on admission given PE - Left lower extremity noted with partial nonocclusive acute DVT involving distal left femoral vein - Also found to have LEFT acute superficial venous thrombosis involving left small saphenous vein and RIGHT acute superficial venous thrombosis involving right small saphenous vein - continue anticoagulation  Hypotension - Possibly due to volume depletion with underlying AKI however may have also had some contribution from clot burden due to PE - He has been on prednisone outpatient for ataxia in setting of immunotherapy -Due to significant hypotension on admission, he was started on stress dose steroids with Solu-Cortef.  Continue through today then can try to start weaning if blood pressure remaining stable  Paroxysmal atrial fibrillation with RVR (HCC) - no known prior history; suspect precipitated from acute stress/illness of PE - s/p diltiazem in ER but BP did not tolerate - on heparin now for anticoagulation - has converted back to NSR when seen this am - rate currently controlled; hold off on further treatment unless coverts back to afib  Acute respiratory failure with hypoxia ([facility] -  Due to underlying PE and also underlying lung cancer with left pleural effusion - Wean oxygen as able - Perform walk test daily  Ataxia - appears has had extensive workup outpatient recently with paraneoplastic workup and LP which has been negative - MRI brain has also  been performed on 08/11/2022 which was also negative for acute abnormalities nor any metastatic disease - Cognition and ataxia felt to possibly be related to his immunotherapy  Pleural effusion, malignant - noted -Unchanged small loculated left pleural effusion with significant irregular pleural thickening at posterior costophrenic angle noted on CTA chest  Non-small cell carcinoma of lung, stage 4, left (HCC) - follows with Dr. Marin Olp - Stage IVA (T3N2N0) adenocarcinoma of the LUL -- resected -- (+) KRAS -- progression - metastasis to right lung, bilateral adrenal glands, bony metastasis to T1, Y 10 and left iliac bone  - continue following with oncology at discharge   AKI (acute kidney injury) (HCC)-resolved as of 09/22/2022 - baseline creatinine ~ 1.1 - patient presents with increase in creat >0.3 mg/dL above baseline, creat increase >1.5x baseline presumed to have occurred within past 7 days PTA -1.5 on admission, suspected prerenal - Fluid resuscitated in setting of hypotension as well -Renal function has improved back to baseline this morning   Hypothyroidism - TSH normal, 1.238 - Continue Synthroid  Fall at home, initial encounter - Due to ongoing ataxia and weakness/deconditioning - PT/OT following - Possibly CIR candidate, follow-up workup  Hypokalemia - Replete as needed  Chronic systolic CHF (congestive heart failure) (HCC) - No signs or symptoms of exacerbation - Toprol and Entresto on hold in setting of hypotension  OSA (obstructive sleep apnea) - Continue nightly CPAP  CAD (coronary artery disease), native coronary artery - Continue aspirin, Crestor -Entresto and Toprol on hold in setting of hypotension  Hyperlipemia - Continue Crestor    Old records reviewed in assessment of this patient  Antimicrobials:   DVT prophylaxis:   Heparin drip  Code Status:   Code Status: DNR  Mobility Assessment (last 72 hours)     Mobility Assessment     Row  Name 09/22/22 1000 09/22/22 0915 09/22/22 0853 09/21/22 2035     Does patient have an order for bedrest or is patient medically unstable -- No - Continue assessment -- No - Continue assessment    What is the highest level of mobility based on the progressive mobility assessment? Level 3 (Stands with assist) - Balance while standing  and cannot march in place Level 3 (Stands with assist) - Balance while standing  and cannot march in place Level 3 (Stands with assist) - Balance while standing  and cannot march in place Level 2 (Chairfast) - Balance while sitting on edge of bed and cannot stand    Is the above level different from baseline mobility prior to current illness? -- Yes - Recommend PT order -- Yes - Recommend PT order             Barriers to discharge: Disposition Plan: Possibly CIR Status is: Inpatient   Objective: Blood pressure 125/77, pulse 65, temperature 97.9 F (36.6 C), temperature source Oral, resp. rate 20, height '5\' 11"'$  (1.803 m), weight 82.8 kg, SpO2 99 %.  Examination:  Physical Exam Constitutional:      General: He is not in acute distress.    Appearance: Normal appearance. He is well-developed. He is not ill-appearing.  HENT:     Head: Normocephalic and atraumatic.     Mouth/Throat:  Mouth: Mucous membranes are moist.  Eyes:     Extraocular Movements: Extraocular movements intact.  Cardiovascular:     Rate and Rhythm: Normal rate and regular rhythm.     Heart sounds: Normal heart sounds.  Pulmonary:     Effort: Pulmonary effort is normal. No respiratory distress.     Breath sounds: No wheezing.     Comments: Decreased left-sided breath sounds at base Abdominal:     General: Bowel sounds are normal. There is no distension.     Palpations: Abdomen is soft.     Tenderness: There is no abdominal tenderness.  Musculoskeletal:        General: Normal range of motion.     Cervical back: Normal range of motion and neck supple.  Skin:    General: Skin is  warm and dry.  Neurological:     Mental Status: He is alert.     Motor: No weakness.     Comments: Cognitive impairment appreciated; unsteady gait noted walking with PT  Psychiatric:        Mood and Affect: Mood normal.        Behavior: Behavior normal.      Consultants:  Pulmonology Oncology  Procedures:    Data Reviewed: Results for orders placed or performed during the hospital encounter of 09/21/22 (from the past 24 hour(s))  Resp panel by RT-PCR (RSV, Flu A&B, Covid) Anterior Nasal Swab     Status: None   Collection Time: 09/21/22  1:36 PM   Specimen: Anterior Nasal Swab  Result Value Ref Range   SARS Coronavirus 2 by RT PCR NEGATIVE NEGATIVE   Influenza A by PCR NEGATIVE NEGATIVE   Influenza B by PCR NEGATIVE NEGATIVE   Resp Syncytial Virus by PCR NEGATIVE NEGATIVE  CBC with Differential     Status: Abnormal   Collection Time: 09/21/22  1:48 PM  Result Value Ref Range   WBC 5.4 4.0 - 10.5 K/uL   RBC 3.57 (L) 4.22 - 5.81 MIL/uL   Hemoglobin 11.6 (L) 13.0 - 17.0 g/dL   HCT 36.4 (L) 39.0 - 52.0 %   MCV 102.0 (H) 80.0 - 100.0 fL   MCH 32.5 26.0 - 34.0 pg   MCHC 31.9 30.0 - 36.0 g/dL   RDW 15.1 11.5 - 15.5 %   Platelets 113 (L) 150 - 400 K/uL   nRBC 0.0 0.0 - 0.2 %   Neutrophils Relative % 89 %   Neutro Abs 4.8 1.7 - 7.7 K/uL   Lymphocytes Relative 4 %   Lymphs Abs 0.2 (L) 0.7 - 4.0 K/uL   Monocytes Relative 6 %   Monocytes Absolute 0.3 0.1 - 1.0 K/uL   Eosinophils Relative 0 %   Eosinophils Absolute 0.0 0.0 - 0.5 K/uL   Basophils Relative 0 %   Basophils Absolute 0.0 0.0 - 0.1 K/uL   Immature Granulocytes 1 %   Abs Immature Granulocytes 0.04 0.00 - 0.07 K/uL  I-Stat venous blood gas, ([facility], [facility], [facility])     Status: Abnormal   Collection Time: 09/21/22  2:02 PM  Result Value Ref Range   pH, Ven 7.402 7.25 - 7.43   pCO2, Ven 43.2 (L) 44 - 60 mmHg   pO2, Ven 25 (LL) 32 - 45 mmHg   Bicarbonate 26.9 20.0 - 28.0 mmol/L   TCO2 28 22 - 32 mmol/L   O2 Saturation 46  %   Acid-Base Excess 2.0 0.0 - 2.0 mmol/L   Sodium 138 135 - 145  mmol/L   Potassium 6.1 (H) 3.5 - 5.1 mmol/L   Calcium, Ion 1.03 (L) 1.15 - 1.40 mmol/L   HCT 34.0 (L) 39.0 - 52.0 %   Hemoglobin 11.6 (L) 13.0 - 17.0 g/dL   Sample type VENOUS    Comment NOTIFIED PHYSICIAN   I-Stat Creatinine, ED (not at [facility] or [facility])     Status: Abnormal   Collection Time: 09/21/22  2:02 PM  Result Value Ref Range   Creatinine, Ser 1.50 (H) 0.61 - 1.24 mg/dL  Basic metabolic panel     Status: Abnormal   Collection Time: 09/21/22  2:57 PM  Result Value Ref Range   Sodium 139 135 - 145 mmol/L   Potassium 3.4 (L) 3.5 - 5.1 mmol/L   Chloride 105 98 - 111 mmol/L   CO2 24 22 - 32 mmol/L   Glucose, Bld 122 (H) 70 - 99 mg/dL   BUN 27 (H) 8 - 23 mg/dL   Creatinine, Ser 1.43 (H) 0.61 - 1.24 mg/dL   Calcium 8.1 (L) 8.9 - 10.3 mg/dL   GFR, Estimated 51 (L) >60 mL/min   Anion gap 10 5 - 15  Troponin I (High Sensitivity)     Status: Abnormal   Collection Time: 09/21/22  3:39 PM  Result Value Ref Range   Troponin I (High Sensitivity) 89 (H) <18 ng/L  Troponin I (High Sensitivity)     Status: Abnormal   Collection Time: 09/21/22  5:51 PM  Result Value Ref Range   Troponin I (High Sensitivity) 94 (H) <18 ng/L  Lactic acid, plasma     Status: None   Collection Time: 09/21/22  7:40 PM  Result Value Ref Range   Lactic Acid, Venous 0.8 0.5 - 1.9 mmol/L  APTT     Status: Abnormal   Collection Time: 09/21/22  7:40 PM  Result Value Ref Range   aPTT 44 (H) 24 - 36 seconds  Protime-INR     Status: Abnormal   Collection Time: 09/21/22  7:40 PM  Result Value Ref Range   Prothrombin Time 20.4 (H) 11.4 - 15.2 seconds   INR 1.8 (H) 0.8 - 1.2  CK     Status: None   Collection Time: 09/21/22  7:40 PM  Result Value Ref Range   Total CK 54 49 - 397 U/L  Magnesium     Status: Abnormal   Collection Time: 09/21/22  7:40 PM  Result Value Ref Range   Magnesium 2.5 (H) 1.7 - 2.4 mg/dL  Phosphorus     Status: None    Collection Time: 09/21/22  7:40 PM  Result Value Ref Range   Phosphorus 2.7 2.5 - 4.6 mg/dL  Osmolality     Status: Abnormal   Collection Time: 09/21/22  7:40 PM  Result Value Ref Range   Osmolality 297 (H) 275 - 295 mOsm/kg  TSH     Status: None   Collection Time: 09/21/22  7:40 PM  Result Value Ref Range   TSH 1.238 0.350 - 4.500 uIU/mL  Reticulocytes     Status: Abnormal   Collection Time: 09/21/22  7:40 PM  Result Value Ref Range   Retic Ct Pct 1.7 0.4 - 3.1 %   RBC. 3.37 (L) 4.22 - 5.81 MIL/uL   Retic Count, Absolute 58.0 19.0 - 186.0 K/uL   Immature Retic Fract 7.7 2.3 - 15.9 %  Heparin level (unfractionated)     Status: Abnormal   Collection Time: 09/21/22  7:40 PM  Result Value Ref Range  Heparin Unfractionated >1.10 (H) 0.30 - 0.70 IU/mL  Brain natriuretic peptide     Status: Abnormal   Collection Time: 09/21/22  7:46 PM  Result Value Ref Range   B Natriuretic Peptide 543.4 (H) 0.0 - 100.0 pg/mL  Troponin I (High Sensitivity)     Status: Abnormal   Collection Time: 09/21/22  7:46 PM  Result Value Ref Range   Troponin I (High Sensitivity) 83 (H) <18 ng/L  Hepatic function panel     Status: Abnormal   Collection Time: 09/21/22  7:46 PM  Result Value Ref Range   Total Protein 5.6 (L) 6.5 - 8.1 g/dL   Albumin 3.1 (L) 3.5 - 5.0 g/dL   AST 26 15 - 41 U/L   ALT 15 0 - 44 U/L   Alkaline Phosphatase 43 38 - 126 U/L   Total Bilirubin 1.3 (H) 0.3 - 1.2 mg/dL   Bilirubin, Direct 0.3 (H) 0.0 - 0.2 mg/dL   Indirect Bilirubin 1.0 (H) 0.3 - 0.9 mg/dL  Iron and TIBC     Status: None   Collection Time: 09/21/22  7:46 PM  Result Value Ref Range   Iron 48 45 - 182 ug/dL   TIBC 251 250 - 450 ug/dL   Saturation Ratios 19 17.9 - 39.5 %   UIBC 203 ug/dL  Ferritin     Status: Abnormal   Collection Time: 09/21/22  7:46 PM  Result Value Ref Range   Ferritin 981 (H) 24 - 336 ng/mL  Lactic acid, plasma     Status: None   Collection Time: 09/21/22  9:28 PM  Result Value Ref Range    Lactic Acid, Venous 1.1 0.5 - 1.9 mmol/L  Vitamin B12     Status: Abnormal   Collection Time: 09/21/22  9:28 PM  Result Value Ref Range   Vitamin B-12 916 (H) 180 - 914 pg/mL  Folate     Status: None   Collection Time: 09/21/22  9:28 PM  Result Value Ref Range   Folate >40.0 >5.9 ng/mL  Troponin I (High Sensitivity)     Status: Abnormal   Collection Time: 09/21/22  9:28 PM  Result Value Ref Range   Troponin I (High Sensitivity) 83 (H) <18 ng/L  MRSA Next Gen by PCR, Nasal     Status: None   Collection Time: 09/22/22  4:26 AM   Specimen: Urine, Clean Catch; Nasal Swab  Result Value Ref Range   MRSA by PCR Next Gen NOT DETECTED NOT DETECTED  Osmolality, urine     Status: None   Collection Time: 09/22/22  6:12 AM  Result Value Ref Range   Osmolality, Ur 794 300 - 900 mOsm/kg  Creatinine, urine, random     Status: None   Collection Time: 09/22/22  6:12 AM  Result Value Ref Range   Creatinine, Urine 119 mg/dL  Urinalysis, Complete w Microscopic -Urine, Clean Catch     Status: Abnormal   Collection Time: 09/22/22  6:12 AM  Result Value Ref Range   Color, Urine YELLOW YELLOW   APPearance CLEAR CLEAR   Specific Gravity, Urine 1.045 (H) 1.005 - 1.030   pH 5.0 5.0 - 8.0   Glucose, UA NEGATIVE NEGATIVE mg/dL   Hgb urine dipstick SMALL (A) NEGATIVE   Bilirubin Urine NEGATIVE NEGATIVE   Ketones, ur 20 (A) NEGATIVE mg/dL   Protein, ur NEGATIVE NEGATIVE mg/dL   Nitrite NEGATIVE NEGATIVE   Leukocytes,Ua NEGATIVE NEGATIVE   RBC / HPF 6-10 0 - 5 RBC/hpf  WBC, UA 0-5 0 - 5 WBC/hpf   Bacteria, UA NONE SEEN NONE SEEN   Squamous Epithelial / HPF 0-5 0 - 5 /HPF  Sodium, urine, random     Status: None   Collection Time: 09/22/22  6:12 AM  Result Value Ref Range   Sodium, Ur 105 mmol/L  Prealbumin     Status: Abnormal   Collection Time: 09/22/22  7:47 AM  Result Value Ref Range   Prealbumin 17 (L) 18 - 38 mg/dL  Heparin level (unfractionated)     Status: Abnormal   Collection Time:  09/22/22  7:47 AM  Result Value Ref Range   Heparin Unfractionated >1.10 (H) 0.30 - 0.70 IU/mL  APTT     Status: Abnormal   Collection Time: 09/22/22  7:47 AM  Result Value Ref Range   aPTT 186 (HH) 24 - 36 seconds  CBC     Status: Abnormal   Collection Time: 09/22/22  7:47 AM  Result Value Ref Range   WBC 5.4 4.0 - 10.5 K/uL   RBC 3.16 (L) 4.22 - 5.81 MIL/uL   Hemoglobin 10.6 (L) 13.0 - 17.0 g/dL   HCT 30.8 (L) 39.0 - 52.0 %   MCV 97.5 80.0 - 100.0 fL   MCH 33.5 26.0 - 34.0 pg   MCHC 34.4 30.0 - 36.0 g/dL   RDW 14.7 11.5 - 15.5 %   Platelets 89 (L) 150 - 400 K/uL   nRBC 0.0 0.0 - 0.2 %  Comprehensive metabolic panel     Status: Abnormal   Collection Time: 09/22/22  7:47 AM  Result Value Ref Range   Sodium 139 135 - 145 mmol/L   Potassium 3.7 3.5 - 5.1 mmol/L   Chloride 111 98 - 111 mmol/L   CO2 20 (L) 22 - 32 mmol/L   Glucose, Bld 132 (H) 70 - 99 mg/dL   BUN 25 (H) 8 - 23 mg/dL   Creatinine, Ser 1.09 0.61 - 1.24 mg/dL   Calcium 7.8 (L) 8.9 - 10.3 mg/dL   Total Protein 5.4 (L) 6.5 - 8.1 g/dL   Albumin 3.0 (L) 3.5 - 5.0 g/dL   AST 16 15 - 41 U/L   ALT 13 0 - 44 U/L   Alkaline Phosphatase 40 38 - 126 U/L   Total Bilirubin 1.0 0.3 - 1.2 mg/dL   GFR, Estimated >60 >60 mL/min   Anion gap 8 5 - 15  Magnesium     Status: Abnormal   Collection Time: 09/22/22  7:47 AM  Result Value Ref Range   Magnesium 2.6 (H) 1.7 - 2.4 mg/dL  Phosphorus     Status: None   Collection Time: 09/22/22  7:47 AM  Result Value Ref Range   Phosphorus 2.7 2.5 - 4.6 mg/dL    I have reviewed pertinent nursing notes, vitals, labs, and images as necessary. I have ordered labwork to follow up on as indicated.  I have reviewed the last notes from staff over past 24 hours. I have discussed patient's care plan and test results with nursing staff, CM/SW, and other staff as appropriate.  Time spent: Greater than 50% of the 55 minute visit was spent in counseling/coordination of care for the patient as laid  out in the A&P.   LOS: 0 days   Dwyane Dee, MD [facility] 09/22/2022, 12:34 PM

## 2022-09-22 NOTE — Assessment & Plan Note (Signed)
-  Continue nightly CPAP °

## 2022-09-22 NOTE — Assessment & Plan Note (Signed)
-   follows with Dr. Marin Olp - Stage IVA (T3N2N0) adenocarcinoma of the LUL -- resected -- (+) KRAS -- progression - metastasis to right lung, bilateral adrenal glands, bony metastasis to T1, Y 10 and left iliac bone  - continue following with oncology at discharge

## 2022-09-22 NOTE — Evaluation (Addendum)
Occupational Therapy Evaluation Patient Details Name: John Hinton MRN: [id number] DOB: [birth year] Today's Date: 09/22/2022   History of Present Illness 77 y.o. male with medical history significant of lung cancer, A-fib with RVR, PE, systolic CHF EF [id number], gout, hypothyroidism, GERD, paraneoplastic syndrome.  Admitted for   Pleural effusion on left, acute pulmonary embolism with acute cor pulmonale.   Clinical Impression   Pt was admitted as above, presenting with deficits as listed below (please refer to OT problem list). Pt currently presents with deficits in ability to perform ADL's and functional transfers safetly at this time. Lives with spouse in 2 story home. Prior to this last week, pt was Mod I bathing (tub level - has walk in shower as well with built in seat), dressing (dons socks when seated) and toilet transfers with out AD per pt wife. Pt cognition is currently impacted with deficits in STM, awreness of deficits, problem solving and following commands. Per pt spouse, he has a RW that he can borrow but he does not have his own. He is currently Min A UB ADL's & bed mobility requiring Min guard-min A in sitting at EOB and Min A for sit to stand using RW. Max assist for LB ADL's. Pt should benefit from acute OT followed by [place]. Recommend 3:1.      Recommendations for follow up therapy are one component of a multi-disciplinary discharge planning process, led by the attending physician.  Recommendations may be updated based on patient status, additional functional criteria and insurance authorization.   Follow Up Recommendations  Home health OT     Assistance Recommended at Discharge Frequent or constant Supervision/Assistance  Patient can return home with the following A little help with walking and/or transfers;A little help with bathing/dressing/bathroom;Assistance with cooking/housework;Direct supervision/assist for financial management;Assist for transportation;Direct  supervision/assist for medications management;Help with stairs or ramp for entrance    Functional Status Assessment  Patient has had a recent decline in their functional status and demonstrates the ability to make significant improvements in function in a reasonable and predictable amount of time.  Equipment Recommendations  BSC/3in1    Recommendations for Other Services       Precautions / Restrictions Precautions Precautions: Fall Precaution Comments: h/o falls (last fell at home 09/21/22) per pt spouse      Mobility Bed Mobility Overal bed mobility: Needs Assistance Bed Mobility: Supine to Sit, Sit to Supine     Supine to sit: Min assist, HOB elevated Sit to supine: Min assist, HOB elevated   General bed mobility comments: Min assist with LE's and trunk    Transfers Overall transfer level: Needs assistance Equipment used: Rolling walker (2 wheels) Transfers: Sit to/from Stand Sit to Stand: Min assist, From elevated surface        Balance Overall balance assessment: Needs assistance, History of Falls Sitting-balance support: Feet supported, Feet unsupported Sitting balance-Leahy Scale: Poor Sitting balance - Comments: Required Min-Min guard assist when sitting up at EOB (posterior lean noted off and on) Postural control: Posterior lean Standing balance support: Bilateral upper extremity supported, Reliant on assistive device for balance Standing balance-Leahy Scale: Poor Standing balance comment: Relaint on bilateral UE support         ADL either performed or assessed with clinical judgement   ADL Overall ADL's : Needs assistance/impaired     Grooming: Set up;Sitting;Minimal assistance;Min guard;Cueing for sequencing Grooming Details (indicate cue type and reason): Intermittent support at trunck as pt was noted to have posterior lean at times, able  to correct with tactile and verbal cues Upper Body Bathing: Minimal assistance;Sitting;Cueing for  sequencing;Cueing for safety   Lower Body Bathing: Moderate assistance;Sit to/from stand;Sitting/lateral leans;Cueing for safety;Cueing for sequencing   Upper Body Dressing : Sitting;Minimal assistance   Lower Body Dressing: Maximal assistance;Sit to/from stand Lower Body Dressing Details (indicate cue type and reason): Max A to don socks attempted at bed level (pt unable) and then while seated, pt unable to maintain sitting balance, max A required Toilet Transfer: Moderate assistance;BSC/3in1;Rolling walker (2 wheels);Cueing for safety;Cueing for sequencing Toilet Transfer Details (indicate cue type and reason): Simulated - sit to stand from EOB and taking lateral steps to [facility] using RW (did not get to chair as pt was getting further testing and needed to be in bed) Toileting- Clothing Manipulation and Hygiene: Moderate assistance;Sit to/from stand   Functional mobility during ADLs: Minimal assistance;Cueing for safety;Cueing for sequencing;Rolling walker (2 wheels) General ADL Comments: Pt was seen for OT assessment and currently presents with deficits in ability to perform ADL's and functional transfers safetly at this time. Pt was admitted as above, presenting with deficits as listed below (please refer to OT problem list). Pt currently presents with deficits in ability to perform ADL's and functional transfers safetly at this time. Lives with spouse in 2 story home. Prior to this last week, pt was Mod I bathing (tub level - has walk in shower as well with built in seat), dressing (dons socks when seated) and toilet transfers with out AD per pt wife. Pt cognition is currently impacted with deficits in STM, awreness of deficits, problem solving and following commands. Per pt spouse, he has a RW that he can borrow but he does not have his own. He is currently Min A UB ADL's & bed mobility requiring Min guard-min A in sitting at EOB and Min A for sit to stand using RW. Max assist for LB ADL's. Pt should  benefit from acute OT followed by [place]. Recommend 3:1. Marland Kitchen     Vision Patient Visual Report: No change from baseline Vision Assessment?: No apparent visual deficits     Perception     Praxis      Pertinent Vitals/Pain Pain Assessment Pain Assessment: No/denies pain     Hand Dominance Right   Extremity/Trunk Assessment Upper Extremity Assessment Upper Extremity Assessment: Overall WFL for tasks assessed   Lower Extremity Assessment Lower Extremity Assessment: Defer to PT evaluation       Communication Communication Communication: No difficulties   Cognition Arousal/Alertness: Awake/alert Behavior During Therapy: Flat affect, WFL for tasks assessed/performed Overall Cognitive Status: Impaired/Different from baseline Area of Impairment: Memory, Following commands, Awareness, Problem solving                     Memory: Decreased short-term memory Following Commands: Follows one step commands with increased time     Problem Solving: Slow processing, Decreased initiation, Difficulty sequencing, Requires verbal cues General Comments: Pt spouse states "This is what happenes during an episode and then it clears up when he gets steroids" Pt assisted with PLOF during this assessment as pt was confused at times     General Comments  Pt with bruising on his back. Pt spouse states it was fall at home yesterday before coming to hospital. O2 98% on RA    Exercises     Shoulder Instructions      Home Living Family/patient expects to be discharged to:: Private residence Living Arrangements: Spouse/significant other Available Help at Discharge:  Family;Available 24 hours/day Type of Home: House Home Access: Stairs to enter [employer] of Steps: 4 Entrance Stairs-Rails: None Home Layout: Two level Alternate Level Stairs-Number of Steps: Original log cabin house with multiple additions. One step to transition between each addition. Alternate Level Stairs-Rails:  None Bathroom Shower/Tub: Tub/shower unit;Walk-in shower         Home Equipment: Shower seat - built Medical sales representative (2 wheels)   Additional Comments: Pt reports that RW is not his, but he has access to it      Prior Functioning/Environment Prior Level of Function : Independent/Modified Independent             Mobility Comments: Recently using RW to get into hospital secondary to decreased balance ADLs Comments: wife recently assisting with ADL's over last 4 days, changes in memory        OT Problem List: Impaired balance (sitting and/or standing);Decreased cognition;Decreased knowledge of precautions;Decreased safety awareness;Decreased activity tolerance;Decreased knowledge of use of DME or AE      OT Treatment/Interventions: Self-care/ADL training;Patient/family education;Energy conservation;Therapeutic activities;DME and/or AE instruction;Cognitive remediation/compensation    OT Goals(Current goals can be found in the care plan section) Acute Rehab OT Goals Patient Stated Goal: None stted OT Goal Formulation: With patient/family Time For Goal Achievement: 10/06/22 Potential to Achieve Goals: Fair  OT Frequency: Min 2X/week       AM-PAC OT "6 Clicks" Daily Activity     Outcome Measure Help from another person eating meals?: A Little Help from another person taking care of personal grooming?: A Little Help from another person toileting, which includes using toliet, bedpan, or urinal?: A Lot Help from another person bathing (including washing, rinsing, drying)?: A Little Help from another person to put on and taking off regular upper body clothing?: A Little Help from another person to put on and taking off regular lower body clothing?: A Lot 6 Click Score: 16   End of Session Equipment Utilized During Treatment: Gait belt;Rolling walker (2 wheels) Nurse Communication: Mobility status  Activity Tolerance: Patient tolerated treatment well Patient left: with  family/visitor present;in bed;Other (comment) (Pt getting testing bedside)  OT Visit Diagnosis: Unsteadiness on feet (R26.81);Other abnormalities of gait and mobility (R26.89);History of falling (Z91.81);Other symptoms and signs involving cognitive function                Time: [id number] OT Time Calculation (min): 24 min Charges:  OT General Charges $OT Visit: 1 Visit OT Evaluation $OT Eval Low Complexity: 1 Low  Roldan Laforest Beth Dixon, OTR/L 09/22/2022, 8:56 AM

## 2022-09-22 NOTE — Assessment & Plan Note (Signed)
-   noted -Unchanged small loculated left pleural effusion with significant irregular pleural thickening at posterior costophrenic angle noted on CTA chest

## 2022-09-22 NOTE — Progress Notes (Signed)
ANTICOAGULATION CONSULT NOTE - Follow-Up Consult  Pharmacy Consult for heparin infusion Indication: atrial fibrillation and acute pulmonary embolus  Allergies  Allergen Reactions   Amlodipine Swelling    Swelling of ankles    Patient Measurements: Height: '5\' 11"'$  (180.3 cm) Weight: 82.8 kg (182 lb 8.7 oz) IBW/kg (Calculated) : 75.3 Heparin Dosing Weight: 82.8 kg  Vital Signs: Temp: 97.6 F (36.4 C) (03/05 1915) Temp Source: Oral (03/05 1915) BP: 108/93 (03/05 1915) Pulse Rate: 62 (03/05 1915)  Labs: Recent Labs    09/21/22 1348 09/21/22 1402 09/21/22 1457 09/21/22 1539 09/21/22 1751 09/21/22 1940 09/21/22 1946 09/21/22 2128 09/22/22 0747 09/22/22 1821  HGB 11.6* 11.6*  --   --   --   --   --   --  10.6*  --   HCT 36.4* 34.0*  --   --   --   --   --   --  30.8*  --   PLT 113*  --   --   --   --   --   --   --  89*  --   APTT  --   --   --   --   --  44*  --   --  186* 75*  LABPROT  --   --   --   --   --  20.4*  --   --   --   --   INR  --   --   --   --   --  1.8*  --   --   --   --   HEPARINUNFRC  --   --   --   --   --  >1.10*  --   --  >1.10*  --   CREATININE  --  1.50* 1.43*  --   --   --   --   --  1.09  --   CKTOTAL  --   --   --   --   --  54  --   --   --   --   TROPONINIHS  --   --   --    < > 94*  --  83* 83*  --   --    < > = values in this interval not displayed.     Estimated Creatinine Clearance: 63.3 mL/min (by C-G formula based on SCr of 1.09 mg/dL).   Medical History: Past Medical History:  Diagnosis Date   BPH (benign prostatic hypertrophy)    Bradycardia    a. H/o asymptomatic bradycardia.   Coronary artery disease    a. s/p CABG ~2007 with LIMA to LAD, SVG seq to OM1 and OM2, SVG to PDA and SVG to diag. b. Abnormal nuc 03/2015 - s/p DES to SVG-OM1-OM2. 01/29/17 POBA with cutting balloon to SVG-->OM.  No ischemia Lexi myoview 2021   DCM (dilated cardiomyopathy) ([place]) 09/17/2015   mixed by cMRI 03/2021 with EF 39% with non viable infarct in  basal to mid lateral wall and LGE pattern c/w nonischemic component as well. Mild RV dysfunction RVEF 42%.   Dyslipidemia    Dyspnea    r/t lung mass   ED (erectile dysfunction)    Fear of heights    Goals of care, counseling/discussion 06/19/2020   History of radiation therapy    T spine and left ilium 10/07/2021-10/20/2021  Dr Gery Pray   Hypertension    Non-small cell carcinoma of lung, stage 4, left (  [place]) 06/19/2020   OSA (obstructive sleep apnea) 07/12/2015   Moderate OSA with AHI 16.5/hr now on CPAP at 8cm H2O, uses cpap nightly      RBBB    Shoulder, capsulitis, adhesive    Left Shoulder    Medications:  Medications Prior to Admission  Medication Sig Dispense Refill Last Dose   aspirin 81 MG tablet Take 81 mg by mouth at bedtime.    09/21/2022   colchicine 0.6 MG tablet Take 1 tablet (0.6 mg total) by mouth every 8 (eight) hours as needed. 60 tablet 1 unk   Cyanocobalamin (B-12 PO) Take 1,000 mcg by mouth daily.    Past Week   diphenoxylate-atropine (LOMOTIL) 2.5-0.025 MG tablet TAKE 2 TABLETS BY MOUTH AT ONSET OF DIARRHEA THEN TAKE 1 TABLET AFTER EACH LOOSE STOOL MAX 8 TABLETS PER DAY (Patient taking differently: Take 2 tablets by mouth 4 (four) times daily as needed for diarrhea or loose stools. TAKE 2 TABLETS BY MOUTH AT ONSET OF DIARRHEA THEN TAKE 1 TABLET AFTER EACH LOOSE STOOL MAX 8 TABLETS PER DAY) 100 tablet 0 unk   ELIQUIS 5 MG TABS tablet TAKE 1 TABLET BY MOUTH TWICE A DAY 60 tablet 3 09/21/2022 at am   ezetimibe (ZETIA) 10 MG tablet Take 1 tablet (10 mg total) by mouth daily. 90 tablet 3 0000000   folic acid (FOLVITE) 1 MG tablet TAKE ONE TABLET BY MOUTH DAILY START 5 TO 7 DAYS PRIOR TO CHEMO AND CONTINUE UNTIL 21 DAYS AFTER COMPLETES 90 tablet 3 Past Week   levothyroxine (SYNTHROID) 50 MCG tablet Take 1 tablet (50 mcg total) by mouth daily before breakfast. 30 tablet 3 09/21/2022   metoprolol succinate (TOPROL-XL) 25 MG 24 hr tablet TAKE ONE TABLET BY MOUTH EVERY NIGHT AT  BEDTIME 90 tablet 0 Past Week   Multiple Vitamin (MULTIVITAMIN) capsule Take 1 capsule by mouth daily.   09/21/2022   nitroGLYCERIN (NITROSTAT) 0.4 MG SL tablet Place 1 tablet (0.4 mg total) under the tongue every 5 (five) minutes as needed for chest pain. 25 tablet 3 unk   pantoprazole (PROTONIX) 40 MG tablet Take 1 tablet (40 mg total) by mouth daily. 30 tablet 3 09/21/2022   predniSONE (DELTASONE) 10 MG tablet Take 1 tablet (10 mg total) by mouth daily with breakfast. 30 tablet 4 09/21/2022   rosuvastatin (CRESTOR) 40 MG tablet TAKE ONE TABLET BY MOUTH EVERY NIGHT AT BEDTIME (Patient taking differently: Take 40 mg by mouth daily.) 90 tablet 1 Past Week   sacubitril-valsartan (ENTRESTO) 24-26 MG TAKE ONE TABLET BY MOUTH TWICE A DAY 60 tablet 9 Past Week   sertraline (ZOLOFT) 100 MG tablet Take 100 mg by mouth 2 (two) times daily.   Past Week   traMADol (ULTRAM) 50 MG tablet Take 1 tablet (50 mg total) by mouth every 6 (six) hours as needed. 90 tablet 0 unk   oxyCODONE-acetaminophen (PERCOCET) 7.5-325 MG tablet Take 1 tablet by mouth every 4 (four) hours as needed for severe pain. 60 tablet 0    predniSONE (DELTASONE) 20 MG tablet Take 3 tablets (60 mg total) by mouth daily with breakfast for 5 days, THEN 2 tablets (40 mg total) daily with breakfast for 25 days. Continue 40 mg dose until discontinued by Physician.. (Patient not taking: Reported on 09/22/2022) 65 tablet 0 Not Taking   sucralfate (CARAFATE) 1 g tablet Take 1 tablet (1 g total) by mouth 4 (four) times daily. Dissolve each tablet in 15 cc water before use. (Patient not taking:  Reported on 09/22/2022) 120 tablet 2 Completed Course   Scheduled:   aspirin  81 mg Oral QHS   hydrocortisone sod succinate (SOLU-CORTEF) inj  100 mg Intravenous Q8H   levothyroxine  50 mcg Oral QAC breakfast   pantoprazole  40 mg Oral Daily   rosuvastatin  40 mg Oral QHS   sertraline  100 mg Oral BID   Infusions:   heparin 1,100 Units/hr (09/22/22 1320)     Assessment: Pt with a hx of PE in 10/22 presented with a new PE. He has been on apixaban '5mg'$  PO BID for AF, concerning for apixaban failure. Last dose of apixaban was 09/21/22 AM. He has a hx of stage 4 lung CA. Pharmacy consulted to dose and manage heparin infusion for treatment of acute PE and AFib  PTT came back therapeutic tonight at 75. We will cont with the current rate and check level in AM.   Hgb trending down to 10.6 Plt trending down from 113 to 89  Likely dilutional drops in CBC No s/sx of bleeding reported by RN overnight.   Goal of Therapy:  Heparin level 0.3-0.7 units/ml aPTT: 66-102 Monitor platelets by anticoagulation protocol: Yes   Plan:  Cont heparin infusion 1100 units/hr Monitor daily CBC, aPTT (until heparin levels are correlating), heparin level, and for s/sx of bleeding F/u long-term anticoagulation plan   Onnie Boer, PharmD, BCIDP, AAHIVP, CPP Infectious Disease Pharmacist 09/22/2022 7:37 PM

## 2022-09-22 NOTE — Progress Notes (Signed)
Inpatient Rehab Admissions Coordinator Note:   Per PT recommendations patient was screened for CIR candidacy by Michel Santee, PT. At this time, pt appears to be a potential candidate for CIR. I will place an order for rehab consult for full assessment, per our protocol.  Please contact me any with questions.Shann Medal, PT, DPT [phone] 09/22/22 3:21 PM

## 2022-09-22 NOTE — Assessment & Plan Note (Signed)
-   Due to underlying PE and also underlying lung cancer with left pleural effusion - Wean oxygen as able - Perform walk test daily

## 2022-09-22 NOTE — Assessment & Plan Note (Signed)
-   Suspected provoked in setting of recent airline travel and also underlying malignancy.  Known history of prior PE as well - Has been on Eliquis at home prior to admission; need to confirm compliance  - continue heparin drip; transition to Lovenox when okay with oncology - appreciate pulmonology consult; no need for lytics at this time unless were to be hemodynamically unstable  - follow up echo

## 2022-09-22 NOTE — Assessment & Plan Note (Signed)
Continue Crestor 

## 2022-09-22 NOTE — Progress Notes (Signed)
PT Cancellation Note  Patient Details Name: John Hinton MRN: [id number] DOB: 09-24-1945   Cancelled Treatment:    Reason Eval/Treat Not Completed: (P) Patient at procedure or test/unavailable Pt is having ECHO done. PT will follow back later for Evaluation  Benjamine Mola B. Migdalia Dk PT, DPT Acute Rehabilitation Services Please use secure chat or  Call Office [phone]  [place] 09/22/2022, 8:45 AM

## 2022-09-22 NOTE — Hospital Course (Signed)
Mr. Gell is a 77 yo male with PMH Stage IV adenocarcinoma LUL s/p resection (mets to right lung, B/L adrenals, lumbar and iliac bones). Follows with Dr. Marin Olp and has been on Xgeva q45month and ongoing Eliquis for history of PE.  He has had some cognitive impairment and has been evaluated by neurology recently and also undergone paraneoplastic workup and infectious workup with LP. This appears negative and that his cognition may be due to immunotherapy per oncology. Due to increase in unsteadiness he underwent recent MRI brain on 08/11/2022.  This was negative for acute abnormalities nor any evidence of metastatic disease.  For this hospitalization, he presented with worsening shortness of breath for approximately 2 weeks.  [place] was feeling short of breath which did not seem to improve throughout the trip and then worsened again upon returning back home. CTA chest was performed which showed acute occlusive lobar and segmental PE in the right middle and lower lobes.  He was also noted to be hypotensive, in A-fib with RVR, and hypoxic on admission. Pulmonology was consulted.  He was not felt to immediately warrant lytic therapy but if were to become hemodynamically unstable then could be further considered with understanding of increased bleeding risk. He was started on a heparin drip on admission and Eliquis was held.

## 2022-09-23 ENCOUNTER — Inpatient Hospital Stay (HOSPITAL_COMMUNITY): Payer: Medicare Other

## 2022-09-23 ENCOUNTER — Ambulatory Visit: Payer: Medicare Other | Admitting: Physical Therapy

## 2022-09-23 ENCOUNTER — Encounter: Payer: Self-pay | Admitting: Hematology & Oncology

## 2022-09-23 ENCOUNTER — Other Ambulatory Visit (HOSPITAL_COMMUNITY): Payer: Self-pay

## 2022-09-23 DIAGNOSIS — Z79899 Other long term (current) drug therapy: Secondary | ICD-10-CM

## 2022-09-23 DIAGNOSIS — J90 Pleural effusion, not elsewhere classified: Secondary | ICD-10-CM

## 2022-09-23 DIAGNOSIS — I2699 Other pulmonary embolism without acute cor pulmonale: Secondary | ICD-10-CM | POA: Diagnosis not present

## 2022-09-23 DIAGNOSIS — I4891 Unspecified atrial fibrillation: Secondary | ICD-10-CM

## 2022-09-23 DIAGNOSIS — C3490 Malignant neoplasm of unspecified part of unspecified bronchus or lung: Secondary | ICD-10-CM

## 2022-09-23 DIAGNOSIS — I2609 Other pulmonary embolism with acute cor pulmonale: Principal | ICD-10-CM

## 2022-09-23 DIAGNOSIS — R410 Disorientation, unspecified: Secondary | ICD-10-CM

## 2022-09-23 LAB — BASIC METABOLIC PANEL
Anion gap: 8 (ref 5–15)
BUN: 29 mg/dL — ABNORMAL HIGH (ref 8–23)
CO2: 21 mmol/L — ABNORMAL LOW (ref 22–32)
Calcium: 7.9 mg/dL — ABNORMAL LOW (ref 8.9–10.3)
Chloride: 110 mmol/L (ref 98–111)
Creatinine, Ser: 1.23 mg/dL (ref 0.61–1.24)
GFR, Estimated: >60 mL/min (ref >60)
Glucose, Bld: 153 mg/dL — ABNORMAL HIGH (ref 70–99)
Potassium: 4.1 mmol/L (ref 3.5–5.1)
Sodium: 139 mmol/L (ref 135–145)

## 2022-09-23 LAB — CBC
HCT: 27.8 % — ABNORMAL LOW (ref 39.0–52.0)
Hemoglobin: 9.7 g/dL — ABNORMAL LOW (ref 13.0–17.0)
MCH: 33.8 pg (ref 26.0–34.0)
MCHC: 34.9 g/dL (ref 30.0–36.0)
MCV: 96.9 fL (ref 80.0–100.0)
Platelets: 88 10*3/uL — ABNORMAL LOW (ref 150–400)
RBC: 2.87 MIL/uL — ABNORMAL LOW (ref 4.22–5.81)
RDW: 14.4 % (ref 11.5–15.5)
WBC: 6.2 10*3/uL (ref 4.0–10.5)
nRBC: 0 % (ref 0.0–0.2)

## 2022-09-23 LAB — MAGNESIUM: Magnesium: 2.6 mg/dL — ABNORMAL HIGH (ref 1.7–2.4)

## 2022-09-23 LAB — HEPARIN LEVEL (UNFRACTIONATED): Heparin Unfractionated: >1.1 IU/mL — ABNORMAL HIGH (ref 0.30–0.70)

## 2022-09-23 LAB — APTT: aPTT: 89 seconds — ABNORMAL HIGH (ref 24–36)

## 2022-09-23 MED ORDER — HYDROCORTISONE SOD SUC (PF) 100 MG IJ SOLR
75.0000 mg | Freq: Two times a day (BID) | INTRAMUSCULAR | Status: AC
  Administered 2022-09-23 – 2022-09-24 (×3): 75 mg via INTRAVENOUS
  Filled 2022-09-23 (×3): qty 1.5

## 2022-09-23 MED ORDER — GADOBUTROL 1 MMOL/ML IV SOLN
7.5000 mL | Freq: Once | INTRAVENOUS | Status: AC | PRN
  Administered 2022-09-23: 7.5 mL via INTRAVENOUS

## 2022-09-23 NOTE — Progress Notes (Signed)
PROGRESS NOTE   John Hinton  [id number]    DOB: 30-Dec-1945    DOA: 09/21/2022  PCP: John Blacker, MD   I have briefly reviewed patients previous medical records in [facility].  Chief Complaint  Patient presents with   Shortness of Breath    Brief Narrative:  [name] is a 77 yo married male with PMH Stage IV adenocarcinoma LUL s/p resection (mets to right lung, B/L adrenals, lumbar and iliac bones), follows with Dr. Marin Olp and has been on Xgeva q17month and ongoing Eliquis for history of PE. He has had some cognitive impairment and has been evaluated by Neurology recently and also undergone paraneoplastic workup and infectious workup with LP. This appears negative and that his cognition may be due to immunotherapy per oncology. Due to increase in unsteadiness he underwent recent MRI brain on 08/11/2022.  This was negative for acute abnormalities nor any evidence of metastatic disease. For this hospitalization, he presented with worsening shortness of breath for approximately 2 weeks.  [facility] [place] [place] was feeling short of breath which did not seem to improve throughout the trip and then worsened again upon returning back home.  As per spouse's report, for almost a week, patient had oxygen saturations in the low 80s with little to no oxygen support. CTA chest confirmed acute occlusive lobar and segmental PE in the right middle and lower lobes.  He was also noted to be hypotensive, in A-fib with RVR, and hypoxic on admission. Pulmonology was consulted.  He was not felt to immediately warrant lytic therapy but if were to become hemodynamically unstable then could be further considered with understanding of increased bleeding risk. He was started on heparin drip with plans to transition to full dose Lovenox due to failed Eliquis treatment.     Assessment & Plan:  Principal Problem:   Acute pulmonary embolism (HCC) Active  Problems:   DVT (deep venous thrombosis) (HCC)   Acute respiratory failure with hypoxia (HCC)   Paroxysmal atrial fibrillation with RVR (HCC)   Ataxia   Non-small cell carcinoma of lung, stage 4, left (HCC)   Pleural effusion, malignant   Hyperlipemia   CAD (coronary artery disease), native coronary artery   OSA (obstructive sleep apnea)   Chronic systolic CHF (congestive heart failure) [place]   Hypokalemia   Fall at home, initial encounter   Hypothyroidism    Pulmonary embolism (HCC)/left lower extremity acute DVT - Suspected provoked in setting of recent airline travel and also underlying malignancy.  Known history of prior PE as well - Confirmed with patient and spouse at bedside that he was compliant with Eliquis.  Hence failed Eliquis. -Has been on IV heparin infusion since admission 3/4, completing approximately 48 hours now.  As per Dr. EAntonieta Pertnote, plans to keep him on heparin for another day and then switch over to Lovenox. - appreciate pulmonology consult; no need for lytics at this time unless were to be hemodynamically unstable  -TTE: LVEF 50-55%, , Grade 1 diastolic dysfunction.  RV systolic function mildly reduced.  Normal pulmonary artery systolic pressure.   Hypotension - Possibly due to volume depletion with underlying AKI however may have also had some contribution from clot burden due to PE - He has been on prednisone outpatient for ataxia in setting of immunotherapy -Due to significant hypotension on admission, he was started on stress dose steroids with Solu-Cortef.   -Blood pressures have normalized.  Will  try to wean off hydrocortisone to prior home dose of prednisone.   Paroxysmal atrial fibrillation with RVR (HCC) - no known prior history; suspect precipitated from acute stress/illness of PE - s/p diltiazem in ER but BP did not tolerate - on heparin now for anticoagulation.  Eventually to transition to Lovenox - has converted back to NSR and remains in  sinus rhythm. - rate currently controlled; hold off on further treatment unless coverts back to afib -TTE results as noted above.   Acute respiratory failure with hypoxia (HCC) - Due to underlying PE and also underlying lung cancer with left pleural effusion - Saturating in the high 90s on room air today. -As per detail his 3 by patient's spouse, for approximately a week up in [place], patient had oxygen saturations in the low 80s when checked by their home pulse oximetry but did not have oxygen support. -Need to do home O2 evaluation prior to discharge.  Altered mental status Unclear etiology.  Extensively evaluated as noted above.  Repeat MRI brain 3/6 negative.  As per spouse at bedside, mental status has significantly improved compared to admission but still not back to baseline and remains somewhat confused.  Prolonged hypoxia during their [place] trip could have contributed.  Anemia: Appears to have baseline hemoglobin in the 11-12 range.  This is gradually drifted down to mid 9 g range, likely related to blood draws, heparin etc.  Follow CBC daily.  Thrombocytopenia Appears chronic and stable.  Monitor.   Ataxia - appears has had extensive workup outpatient recently with paraneoplastic workup and LP which has been negative - MRI brain has also been performed on 08/11/2022 which was also negative for acute abnormalities nor any metastatic disease.  Repeat MRI brain 3/6: Negative for acute findings or metastatic disease. - Cognition and ataxia felt to possibly be related to his immunotherapy -Therapies recommend CIR.  Rehab admission coordinator on board.   Pleural effusion, malignant - noted -Unchanged small loculated left pleural effusion with significant irregular pleural thickening at posterior costophrenic angle noted on CTA chest   Non-small cell carcinoma of lung, stage 4, left (HCC) - follows with Dr. Marin Olp - Stage IVA (T3N2N0) adenocarcinoma of the LUL -- resected --  (+) KRAS -- progression - metastasis to right lung, bilateral adrenal glands, bony metastasis to T1, Y 10 and left iliac bone  - continue following with oncology at discharge  -Dr. Antonieta Pert input appreciated.  She notes that patient may have to be switched to regular chemotherapy.   AKI (acute kidney injury) (HCC)-resolved as of 09/22/2022 - baseline creatinine ~ 1.1 - patient presents with increase in creat >0.3 mg/dL above baseline, creat increase >1.5x baseline presumed to have occurred within past 7 days PTA -1.5 on admission, suspected prerenal - Fluid resuscitated in setting of hypotension as well -Monitor BMP periodically.    Hypothyroidism - TSH normal, 1.238 - Continue Synthroid   Fall at home, initial encounter - Due to ongoing ataxia and weakness/deconditioning - PT/OT following and recommend CIR.   Hypokalemia -Replaced.   Chronic systolic CHF (congestive heart failure) (HCC) - No signs or symptoms of exacerbation - Toprol and Entresto on hold in setting of hypotension   OSA (obstructive sleep apnea) - Continue nightly CPAP   CAD (coronary artery disease), native coronary artery - Continue aspirin, Crestor -Entresto and Toprol on hold in setting of hypotension   Hyperlipemia - Continue Crestor   Body mass index is 25.46 kg/m.    DVT prophylaxis:   Currently  on IV heparin drip.   Code Status: DNR:  ACP Documents: None present Family Communication: Spouse at bedside Disposition:  Status is: Inpatient Remains inpatient appropriate because: Remains on IV heparin.  Hopefully can transition to Lovenox tomorrow and will then be medically ready for DC to next level of care, CIR if accepted versus others.     Consultants:   Medical oncology PCCM  Procedures:     Antimicrobials:      Subjective:  Interviewed and examined along with spouse at bedside.  Reports intermittent cough that started this morning after he ate some sausage and seem to rush it and  did not chew properly.  Per spouse, is less unsteady with therapies.  Ongoing DOE.  Patient really wants to go home and does not wish to go to CIR.  Objective:   Vitals:   09/22/22 1915 09/22/22 2330 09/23/22 0356 09/23/22 0744  BP: (!) 108/93 123/76 101/75 129/80  Pulse: 62 61 (!) 59 64  Resp: '19 15 14 17  '$ Temp: 97.6 F (36.4 C) 97.8 F (36.6 C) 97.8 F (36.6 C) 97.8 F (36.6 C)  TempSrc: Oral Oral Oral Oral  SpO2: 98% 96% 100% 99%  Weight:      Height:        General exam: Elderly male, moderately built and nourished sitting up comfortably in reclining chair without distress. Respiratory system: Slightly diminished breath sounds in the bases but otherwise clear to auscultation.  No increased work of breathing.  Midline sternotomy scar. Cardiovascular system: S1 & S2 heard, RRR. No JVD, murmurs, rubs, gallops or clicks. No pedal edema.  Telemetry personally reviewed: Sinus rhythm. Gastrointestinal system: Abdomen is nondistended, soft and nontender. No organomegaly or masses felt. Normal bowel sounds heard. Central nervous system: Alert and oriented. No focal neurological deficits. Extremities: Symmetric 5 x 5 power. Skin: No rashes, lesions or ulcers Psychiatry: Judgement and insight appears somewhat impaired. Mood & affect appropriate.     Data Reviewed:   I have personally reviewed following labs and imaging studies   CBC: Recent Labs  Lab 09/21/22 1348 09/21/22 1402 09/22/22 0747 09/23/22 0125  WBC 5.4  --  5.4 6.2  NEUTROABS 4.8  --   --   --   HGB 11.6* 11.6* 10.6* 9.7*  HCT 36.4* 34.0* 30.8* 27.8*  MCV 102.0*  --  97.5 96.9  PLT 113*  --  89* 88*    Basic Metabolic Panel: Recent Labs  Lab 09/21/22 1402 09/21/22 1457 09/21/22 1940 09/22/22 0747 09/23/22 0125  NA 138 139  --  139 139  K 6.1* 3.4*  --  3.7 4.1  CL  --  105  --  111 110  CO2  --  24  --  20* 21*  GLUCOSE  --  122*  --  132* 153*  BUN  --  27*  --  25* 29*  CREATININE 1.50* 1.43*  --   1.09 1.23  CALCIUM  --  8.1*  --  7.8* 7.9*  MG  --   --  2.5* 2.6* 2.6*  PHOS  --   --  2.7 2.7  --     Liver Function Tests: Recent Labs  Lab 09/21/22 1946 09/22/22 0747  AST 26 16  ALT 15 13  ALKPHOS 43 40  BILITOT 1.3* 1.0  PROT 5.6* 5.4*  ALBUMIN 3.1* 3.0*    CBG: No results for input(s): "GLUCAP" in the last 168 hours.  Microbiology Studies:   Recent Results (from the  past 240 hour(s))  Resp panel by RT-PCR (RSV, Flu A&B, Covid) Anterior Nasal Swab     Status: None   Collection Time: 09/21/22  1:36 PM   Specimen: Anterior Nasal Swab  Result Value Ref Range Status   SARS Coronavirus 2 by RT PCR NEGATIVE NEGATIVE Final   Influenza A by PCR NEGATIVE NEGATIVE Final   Influenza B by PCR NEGATIVE NEGATIVE Final    Comment: (NOTE) The Xpert Xpress SARS-CoV-2/FLU/RSV plus assay is intended as an aid in the diagnosis of influenza from Nasopharyngeal swab specimens and should not be used as a sole basis for treatment. Nasal washings and aspirates are unacceptable for Xpert Xpress SARS-CoV-2/FLU/RSV testing.  Fact Sheet for Patients: EntrepreneurPulse.com.au  Fact Sheet for Healthcare Providers: IncredibleEmployment.be  This test is not yet approved or cleared by the [place] FDA and has been authorized for detection and/or diagnosis of SARS-CoV-2 by FDA under an Emergency Use Authorization (EUA). This EUA will remain in effect (meaning this test can be used) for the duration of the COVID-19 declaration under Section 564(b)(1) of the Act, 21 U.S.C. section 360bbb-3(b)(1), unless the authorization is terminated or revoked.     Resp Syncytial Virus by PCR NEGATIVE NEGATIVE Final    Comment: (NOTE) Fact Sheet for Patients: EntrepreneurPulse.com.au  Fact Sheet for Healthcare Providers: IncredibleEmployment.be  This test is not yet approved or cleared by the [place] FDA and has been  authorized for detection and/or diagnosis of SARS-CoV-2 by FDA under an Emergency Use Authorization (EUA). This EUA will remain in effect (meaning this test can be used) for the duration of the COVID-19 declaration under Section 564(b)(1) of the Act, 21 U.S.C. section 360bbb-3(b)(1), unless the authorization is terminated or revoked.  Performed at [facility], [place] [address], [place], [place] [address]   MRSA Next Gen by PCR, Nasal     Status: None   Collection Time: 09/22/22  4:26 AM   Specimen: Urine, Clean Catch; Nasal Swab  Result Value Ref Range Status   MRSA by PCR Next Gen NOT DETECTED NOT DETECTED Final    Comment: (NOTE) The GeneXpert MRSA Assay (FDA approved for NASAL specimens only), is one component of a comprehensive MRSA colonization surveillance program. It is not intended to diagnose MRSA infection nor to guide or monitor treatment for MRSA infections. Test performance is not FDA approved in patients less than 22 years old. Performed at [facility], [place] [address], [place], [place] [address]     Radiology Studies:  MR BRAIN W WO CONTRAST  Result Date: 09/23/2022 CLINICAL DATA:  Confusion EXAM: MRI HEAD WITHOUT AND WITH CONTRAST TECHNIQUE: Multiplanar, multiecho pulse sequences of the brain and surrounding structures were obtained without and with intravenous contrast. CONTRAST:  7.52m GADAVIST GADOBUTROL 1 MMOL/ML IV SOLN COMPARISON:  MRI Head 08/11/2022 FINDINGS: Brain: Negative for an acute infarct. No hemorrhage. No hydrocephalus. No extra-axial fluid collection. No hemorrhage. Likely left frontal DVA. No evidence of a parenchymal contrast-enhancing lesion. No evidence of leptomeningeal disease. Vascular: Normal flow voids Skull and upper cervical spine: Normal marrow signal. Sinuses/Orbits: Mild mucosal thickening in the posterior ethmoid air cells on the left of the floor of bilateral maxillary sinuses. No mastoid or middle ear effusion. Orbits are  unremarkable. Other: None. IMPRESSION: No acute intracranial abnormality. No evidence of intracranial metastatic disease. Electronically Signed   By: HMarin RobertsM.D.   On: 09/23/2022 13:22   VAS [place]OWER EXTREMITY VENOUS (DVT)  Result Date: 09/22/2022  Lower Venous DVT Study Patient Name:  AMED PRETTY  Date of Exam:   09/22/2022 Medical Rec #: [id number]         Accession #:    [id number] Date of Birth: [birth year]         Patient Gender: M Patient Age:   66 years Exam Location:  [facility] Procedure:      VAS [place] LOWER EXTREMITY VENOUS (DVT) Referring Phys: Nyoka Lint DOUTOVA --------------------------------------------------------------------------------  Indications: Pulmonary embolism.  Risk Factors: Cancer history - lung. Comparison Study: CTA chest yesterday was positive for pulmonary embolism.                    05-05-2021 Prior bilateral lower extremity venous study was                   negative for DVT. Performing Technologist: Darlin Coco RDMS, RVT  Examination Guidelines: A complete evaluation includes B-mode imaging, spectral Doppler, color Doppler, and power Doppler as needed of all accessible portions of each vessel. Bilateral testing is considered an integral part of a complete examination. Limited examinations for reoccurring indications may be performed as noted. The reflux portion of the exam is performed with the patient in reverse Trendelenburg.  +---------+---------------+---------+-----------+----------+--------------+ RIGHT    CompressibilityPhasicitySpontaneityPropertiesThrombus Aging +---------+---------------+---------+-----------+----------+--------------+ CFV      Full           Yes      Yes                                 +---------+---------------+---------+-----------+----------+--------------+ SFJ      Full                                                        +---------+---------------+---------+-----------+----------+--------------+ FV Prox   Full                                                        +---------+---------------+---------+-----------+----------+--------------+ FV Mid   Full                                                        +---------+---------------+---------+-----------+----------+--------------+ FV DistalFull                                                        +---------+---------------+---------+-----------+----------+--------------+ PFV      Full                                                        +---------+---------------+---------+-----------+----------+--------------+ POP      Full  Yes      Yes                                 +---------+---------------+---------+-----------+----------+--------------+ PTV      Full                                                        +---------+---------------+---------+-----------+----------+--------------+ PERO     Full                                                        +---------+---------------+---------+-----------+----------+--------------+ Gastroc  Full                                                        +---------+---------------+---------+-----------+----------+--------------+ SSV      Partial        Yes      Yes                  Acute          +---------+---------------+---------+-----------+----------+--------------+   +---------+---------------+---------+-----------+----------+-----------------+ LEFT     CompressibilityPhasicitySpontaneityPropertiesThrombus Aging    +---------+---------------+---------+-----------+----------+-----------------+ CFV      Full           Yes      Yes                                    +---------+---------------+---------+-----------+----------+-----------------+ SFJ      Full                                                           +---------+---------------+---------+-----------+----------+-----------------+ FV Prox  Full                                                            +---------+---------------+---------+-----------+----------+-----------------+ FV Mid   Full                                                           +---------+---------------+---------+-----------+----------+-----------------+ FV DistalPartial        Yes      Yes                  Age Indeterminate +---------+---------------+---------+-----------+----------+-----------------+ PFV      Full                                                           +---------+---------------+---------+-----------+----------+-----------------+  POP      Full           Yes      Yes                                    +---------+---------------+---------+-----------+----------+-----------------+ PTV      Full                                                           +---------+---------------+---------+-----------+----------+-----------------+ Gastroc  Full                                                           +---------+---------------+---------+-----------+----------+-----------------+ SSV      Partial        Yes      Yes                  Acute             +---------+---------------+---------+-----------+----------+-----------------+     Summary: RIGHT: - Findings consistent with acute superficial vein thrombosis involving the right small saphenous vein. - No cystic structure found in the popliteal fossa.  LEFT: - Findings consistent with partial, non-occlusive acute deep vein thrombosis involving the distal left femoral vein. - Findings consistent with acute superficial vein thrombosis involving the left small saphenous vein. - No cystic structure found in the popliteal fossa.  *See table(s) above for measurements and observations. Electronically signed by Harold Barban MD on 09/22/2022 at 10:11:13 PM.    Final    ECHOCARDIOGRAM COMPLETE  Result Date: 09/22/2022    ECHOCARDIOGRAM REPORT   Patient Name:   ALEJANDRA LEMMO Date of Exam:  09/22/2022 Medical Rec #:  [id number]        Height:       71.0 in Accession #:    [id number]       Weight:       182.5 lb Date of Birth:  04-11-1946        BSA:          2.028 m Patient Age:    16 years         BP:           111/80 mmHg Patient Gender: M                HR:           59 bpm. Exam Location:  Inpatient Procedure: 2D Echo, Cardiac Doppler and Color Doppler Indications:    I26.02 Pulmonary embolus  History:        Patient has prior history of Echocardiogram examinations. CAD,                 Arrythmias:RBBB; Risk Factors:Hypertension and Dyslipidemia.  Sonographer:    Phineas Douglas Referring Phys: [id number] [place]  1. Left ventricular ejection fraction, by estimation, is 50 to 55%. The left ventricle has low normal function. The left ventricle has no regional wall motion abnormalities. There is moderate left ventricular hypertrophy. Left ventricular diastolic parameters are consistent with Grade I diastolic dysfunction (  impaired relaxation).  2. Right ventricular systolic function is mildly reduced. The right ventricular size is normal. There is normal pulmonary artery systolic pressure. The estimated right ventricular systolic pressure is Q000111Q mmHg.  3. Left atrial size was moderately dilated.  4. The mitral valve is grossly normal. Mild mitral valve regurgitation. No evidence of mitral stenosis.  5. The aortic valve is tricuspid. Aortic valve regurgitation is not visualized. Aortic valve sclerosis/calcification is present, without any evidence of aortic stenosis.  6. Aortic dilatation noted. There is mild dilatation of the aortic root, measuring 42 mm.  7. The inferior vena cava is normal in size with greater than 50% respiratory variability, suggesting right atrial pressure of 3 mmHg. Comparison(s): No significant change from prior study. FINDINGS  Left Ventricle: Left ventricular ejection fraction, by estimation, is 50 to 55%. The left ventricle has low normal function. The left  ventricle has no regional wall motion abnormalities. The left ventricular internal cavity size was normal in size. There is moderate left ventricular hypertrophy. Left ventricular diastolic parameters are consistent with Grade I diastolic dysfunction (impaired relaxation). Right Ventricle: The right ventricular size is normal. No increase in right ventricular wall thickness. Right ventricular systolic function is mildly reduced. There is normal pulmonary artery systolic pressure. The tricuspid regurgitant velocity is 2.78 m/s, and with an assumed right atrial pressure of 3 mmHg, the estimated right ventricular systolic pressure is Q000111Q mmHg. Left Atrium: Left atrial size was moderately dilated. Right Atrium: Right atrial size was normal in size. Pericardium: There is no evidence of pericardial effusion. Mitral Valve: The mitral valve is grossly normal. Mild mitral valve regurgitation. No evidence of mitral valve stenosis. Tricuspid Valve: The tricuspid valve is grossly normal. Tricuspid valve regurgitation is mild . No evidence of tricuspid stenosis. Aortic Valve: The aortic valve is tricuspid. Aortic valve regurgitation is not visualized. Aortic valve sclerosis/calcification is present, without any evidence of aortic stenosis. Aortic valve mean gradient measures 5.3 mmHg. Aortic valve peak gradient measures 9.2 mmHg. Aortic valve area, by VTI measures 1.93 cm. Pulmonic Valve: The pulmonic valve was grossly normal. Pulmonic valve regurgitation is not visualized. No evidence of pulmonic stenosis. Aorta: Aortic dilatation noted. There is mild dilatation of the aortic root, measuring 42 mm. Venous: The inferior vena cava is normal in size with greater than 50% respiratory variability, suggesting right atrial pressure of 3 mmHg. IAS/Shunts: The atrial septum is grossly normal.  LEFT VENTRICLE PLAX 2D LVIDd:         5.10 cm      Diastology LVIDs:         3.90 cm      LV e' medial:    3.92 cm/s LV PW:         1.40 cm       LV E/e' medial:  13.0 LV IVS:        1.50 cm      LV e' lateral:   6.42 cm/s LVOT diam:     1.90 cm      LV E/e' lateral: 7.9 LV SV:         64 LV SV Index:   32 LVOT Area:     2.84 cm  LV Volumes (MOD) LV vol d, MOD A2C: 139.0 ml LV vol d, MOD A4C: 126.0 ml LV vol s, MOD A2C: 68.1 ml LV vol s, MOD A4C: 61.3 ml LV SV MOD A2C:     70.9 ml LV SV MOD A4C:     126.0 ml  LV SV MOD BP:      68.0 ml RIGHT VENTRICLE             IVC RV Basal diam:  4.30 cm     IVC diam: 0.90 cm RV S prime:     10.20 cm/s TAPSE (M-mode): 1.4 cm LEFT ATRIUM             Index        RIGHT ATRIUM           Index LA diam:        3.90 cm 1.92 cm/m   RA Area:     20.10 cm LA Vol (A2C):   94.3 ml 46.49 ml/m  RA Volume:   48.80 ml  24.06 ml/m LA Vol (A4C):   91.3 ml 45.01 ml/m LA Biplane Vol: 94.1 ml 46.39 ml/m  AORTIC VALVE AV Area (Vmax):    1.83 cm AV Area (Vmean):   1.70 cm AV Area (VTI):     1.93 cm AV Vmax:           152.03 cm/s AV Vmean:          109.505 cm/s AV VTI:            0.332 m AV Peak Grad:      9.2 mmHg AV Mean Grad:      5.3 mmHg LVOT Vmax:         98.30 cm/s LVOT Vmean:        65.600 cm/s LVOT VTI:          0.226 m LVOT/AV VTI ratio: 0.68  AORTA Ao Root diam: 4.20 cm Ao Asc diam:  3.60 cm MITRAL VALVE               TRICUSPID VALVE MV Area (PHT): 2.28 cm    TR Peak grad:   30.9 mmHg MV Decel Time: 333 msec    TR Vmax:        278.00 cm/s MV E velocity: 51.00 cm/s MV A velocity: 60.40 cm/s  SHUNTS MV E/A ratio:  0.84        Systemic VTI:  0.23 m                            Systemic Diam: 1.90 cm Eleonore Chiquito MD Electronically signed by Eleonore Chiquito MD Signature Date/Time: 09/22/2022/9:40:29 AM    Final    CT Angio Chest PE W and/or Wo Contrast  Result Date: 09/21/2022 CLINICAL DATA:  Shortness of breath. History of pulmonary embolism and lung cancer. EXAM: CT ANGIOGRAPHY CHEST WITH CONTRAST TECHNIQUE: Multidetector CT imaging of the chest was performed using the standard protocol during bolus administration of intravenous  contrast. Multiplanar CT image reconstructions and MIPs were obtained to evaluate the vascular anatomy. RADIATION DOSE REDUCTION: This exam was performed according to the departmental dose-optimization program which includes automated exposure control, adjustment of the mA and/or kV according to patient size and/or use of iterative reconstruction technique. CONTRAST:  90m OMNIPAQUE IOHEXOL 350 MG/ML SOLN COMPARISON:  PET-CT dated July 17, 2022. FINDINGS: Cardiovascular: Satisfactory opacification of the pulmonary arteries to the segmental level. Acute occlusive pulmonary emboli in the right interlobar pulmonary artery, right middle lobar and segmental pulmonary arteries, and right superior and basal segmental pulmonary arteries. Mild cardiomegaly with right heart enlargement. Elevated RV/LV ratio of 1.35. Prior CABG. No pericardial effusion. No thoracic aortic aneurysm or dissection. Coronary, aortic arch, and branch vessel atherosclerotic vascular  disease. Mediastinum/Nodes: No enlarged mediastinal, hilar, or axillary lymph nodes. Unchanged calcified right hilar and subcarinal lymph nodes. Thyroid gland, trachea, and esophagus demonstrate no significant findings. Lungs/Pleura: Postsurgical changes from prior left upper lobectomy. Small subpleural opacity in the superior segment of the right upper lobe may reflect developing infarct or atelectasis. New mild right basilar subsegmental atelectasis. Unchanged calcified granuloma in the posterior right lower lobe. Unchanged small loculated left pleural effusion with significant irregular pleural thickening at the posterior costophrenic angle. Unchanged scarring in the posterior left lower lobe. Upper Abdomen: Enlarging left adrenal metastasis, currently 4.6 x 3.3 cm, previously nonmeasurable. Unchanged right adrenal metastasis. Musculoskeletal: Unchanged T1 and T10 sclerotic metastases. Review of the MIP images confirms the above findings. IMPRESSION: 1. Acute  occlusive lobar and segmental pulmonary emboli in the right middle and lower lobes. Positive for acute PE with CT evidence of right heart strain (RV/LV Ratio = 1.35 ) consistent with at least submassive (intermediate risk) PE. The presence of right heart strain has been associated with an increased risk of morbidity and mortality. Please refer to the "PE Focused" order set in EPIC. 2. Small subpleural opacity in the superior segment of the right upper lobe may reflect developing infarct or atelectasis. 3. Unchanged small loculated left pleural effusion with significant irregular pleural thickening at the posterior costophrenic angle, concerning for malignancy. 4. Enlarging left adrenal metastasis, currently 4.6 x 3.3 cm, previously non-measurable. Unchanged right adrenal metastasis. 5. Unchanged T1 and T10 sclerotic metastases. 6.  Aortic atherosclerosis (ICD10-I70.0). Critical Value/emergent results were called by telephone at the time of interpretation on 09/21/2022 at 5:59 pm to provider DAN FLOYD, who verbally acknowledged these results. Electronically Signed   By: Titus Dubin M.D.   On: 09/21/2022 18:03   CT Head Wo Contrast  Result Date: 09/21/2022 CLINICAL DATA:  Gait instability.  History of lung cancer. EXAM: CT HEAD WITHOUT CONTRAST TECHNIQUE: Contiguous axial images were obtained from the base of the skull through the vertex without intravenous contrast. RADIATION DOSE REDUCTION: This exam was performed according to the departmental dose-optimization program which includes automated exposure control, adjustment of the mA and/or kV according to patient size and/or use of iterative reconstruction technique. COMPARISON:  CT head dated August 19, 2022 FINDINGS: Brain: No evidence of acute infarction, hemorrhage, hydrocephalus, extra-axial collection or mass lesion/mass effect. Low-attenuation of the periventricular white matter presumed chronic microvascular ischemic changes. Vascular: No hyperdense  vessel or unexpected calcification. Skull: Normal. Negative for fracture or focal lesion. Sinuses/Orbits: Partial opacification of the left maxillary sinus. Remaining paranasal sinuses and mastoid air cells are clear. Other: None. IMPRESSION: 1. No acute intracranial abnormality. 2. Chronic microvascular ischemic changes of the white matter. 3. Partial opacification of the left maxillary sinus. Electronically Signed   By: Keane Police D.O.   On: 09/21/2022 17:23    Scheduled Meds:    aspirin  81 mg Oral QHS   hydrocortisone sod succinate (SOLU-CORTEF) inj  100 mg Intravenous Q8H   levothyroxine  50 mcg Oral QAC breakfast   pantoprazole  40 mg Oral Daily   rosuvastatin  40 mg Oral QHS   sertraline  100 mg Oral BID    Continuous Infusions:    heparin 1,100 Units/hr (09/22/22 1320)     LOS: 1 day     Vernell Leep, MD,  FACP, FHM, SFHM, [facility], [place]     To contact the attending provider between 7A-7P or the covering provider during  after hours 7P-7A, please log into the web site [email or web address] and access using universal [employer] password for that web site. If you do not have the password, please call the hospital operator.  09/23/2022, 2:25 PM

## 2022-09-23 NOTE — Consult Note (Signed)
[name] is well-known to me.  He is a very nice 77 year old white male.  He has metastatic adenocarcinoma of the lung.  He has been on targeted therapy with [place].  He just got back from [place].  He had a hard time out in [place].  He had a lot of shortness of breath.  He has had issues with confusion.  He was hospitalized back in February.  He had a thorough workup.  He did have a paraneoplastic panel sent off that was NEGATIVE.  He now comes in with a new pulmonary embolism.  He had been on Eliquis.  He says he has been taking the Eliquis.  He does have a left lower extremity DVT.  He has a right lower extremity superficial thrombus.  He is currently is on heparin.  He is going need to be on Lovenox as an outpatient.  There is still some confusion.  Again, he has had a thorough workup.  I suppose he could still have a paraneoplastic issue although this would be hard to prove.  He unfortunately seems to have progressive disease on his CT that he had done.  As such, he probably is going need to be switched over to actual chemotherapy.  He had labs today which showed a sodium 139.  Potassium 4.1.  BUN 29 creatinine 1.23.  Calcium is 7.9.  His albumin was 3.0.  White cell count 6.2.  Hemoglobin 9.7.  Platelet count 88,000.  There is been no obvious fever.  There is no obvious infection.  He has had no bleeding.    His vital signs are temperature 97.8.  Pulse 59.  Blood pressure 101/75.  Oxygen saturation is 100%.  He is head neck exam shows no ocular or oral lesions.  He has no adenopathy.  Lungs sound relatively clear bilaterally.  Cardiac exam regular rate and rhythm.  Abdomen is soft.  Bowel sounds are present.  No guarding or rebound tenderness.  Extremities shows no obvious swelling.  There is no obvious pain with palpation.  Neurological exam shows some slight disorientation.  Clearly, his cancer is more active.  I think that the CT scan is quite evident for progressive disease.   As such, we will  have to make a switch to chemotherapy.  He clearly needs to have Lovenox now.  I will keep him on heparin for another day.  I am unsure the confusion will improve significantly.  The paraneoplastic panel was all negative.  However, that  does not negate the fact that he still may have a problem.  He is on hydrocortisone right now.  I think that from a to do chemotherapy, he probably is going need to have a Port-A-Cath placed.  I will have to talk to his wife about this.  I know that he is getting great care from all the staff up on 4 E.  Lattie Haw, MD  Jeneen Rinks 1:5

## 2022-09-23 NOTE — Progress Notes (Signed)
Occupational Therapy Treatment Patient Details Name: John Hinton MRN: [id number] DOB: 11-24-1945 Today's Date: 09/23/2022   History of present illness 77 y.o. male with medical history significant of lung cancer, A-fib with RVR, PE, systolic CHF EF [id number], gout, hypothyroidism, GERD, paraneoplastic syndrome.  Admitted for   Pleural effusion on left, acute pulmonary embolism with acute cor pulmonale.   OT comments  Pt admitted as above. Pt was seen for OT ADL retraining session with focus on grooming in standing at sink (Min A, vc's for safety and sequencing throughout secondary to impaired cognition), Sitting at sink level for UB/LB bathe and dress (Min-Mod A), Functional mobility and transfer to recliner - pt required step by step instruction for safety and sequencing with RW and ADL tasks, pt is overall min A for short distances in a quiet environment but requires Mod A x1 secondary to retropulsion when ambulating and speaking to spouse noted. Pt is impulsive at times, he would benefit from AIR to assist in maximizing independence with ADL's and self care.   Recommendations for follow up therapy are one component of a multi-disciplinary discharge planning process, led by the attending physician.  Recommendations may be updated based on patient status, additional functional criteria and insurance authorization.    Follow Up Recommendations  Acute inpatient rehab (3hours/day)     Assistance Recommended at Discharge Frequent or constant Supervision/Assistance  Patient can return home with the following  Assistance with cooking/housework;Direct supervision/assist for financial management;Assist for transportation;Direct supervision/assist for medications management;Help with stairs or ramp for entrance;A lot of help with bathing/dressing/bathroom;A lot of help with walking and/or transfers   Equipment Recommendations  Other (comment) (Defer to next venue)    Recommendations for Other Services       Precautions / Restrictions Precautions Precautions: Fall Precaution Comments: h/o falls (last fell at home 09/21/22) per pt spouse Restrictions Weight Bearing Restrictions: No       Mobility Bed Mobility Overal bed mobility: Needs Assistance Bed Mobility: Supine to Sit     Supine to sit: HOB elevated, Min guard     General bed mobility comments: min guard for safety, increased time and effort to come to EoB, vc for sequening to get hips to EOB    Transfers Overall transfer level: Needs assistance Equipment used: Rolling walker (2 wheels) Transfers: Sit to/from Stand, Bed to chair/wheelchair/BSC Sit to Stand: Min assist, From elevated surface           General transfer comment: multimodal cuing for hand placement to safely use RW, good power up but requires min-mod A for steadying due to retropulsion x1 when talking to spouse and walking     Balance Overall balance assessment: Needs assistance, History of Falls Sitting-balance support: Feet supported, Feet unsupported Sitting balance-Leahy Scale: Poor Sitting balance - Comments: Required Min-Min guard assist when sitting up at EOB (posterior lean noted off and on) Postural control: Posterior lean Standing balance support: Bilateral upper extremity supported, Reliant on assistive device for balance Standing balance-Leahy Scale: Poor Standing balance comment: Relaint on bilateral UE support     ADL either performed or assessed with clinical judgement   ADL Overall ADL's : Needs assistance/impaired     Grooming: Wash/dry hands;Wash/dry face;Oral care;Brushing hair;Standing;Minimal assistance Grooming Details (indicate cue type and reason): Min Assist in standing at sink, pt with 1 LOB posteriorly, requires vc's tcs for safety secondary to cognitive deficits Upper Body Bathing: Minimal assistance;Sitting Upper Body Bathing Details (indicate cue type and reason): Sitting at sink for UB  bathing - required vc's and  step by step instruction Lower Body Bathing: Minimal assistance;Moderate assistance;Sitting/lateral leans;Sit to/from stand Lower Body Bathing Details (indicate cue type and reason): Sitting and sit to stand at sink for LB bathing Upper Body Dressing : Sitting;Minimal assistance;Cueing for sequencing   Lower Body Dressing: Minimal assistance;Moderate assistance;Sit to/from stand;Sitting/lateral leans Lower Body Dressing Details (indicate cue type and reason): Max A to don socks Toilet Transfer: Minimal assistance;Rolling walker (2 wheels);BSC/3in1 Toilet Transfer Details (indicate cue type and reason): Sinulated - pt ambulated to sink and sat on 3:1 during bathing. VC's for safety   Functional mobility during ADLs: Minimal assistance;Cueing for safety;Cueing for sequencing;Rolling walker (2 wheels) (Pt is Min A in quiet environment, when pt spouse began speaking, pt with retropulsion noted and Mod A to correct using gait belt and RW) General ADL Comments: Pt was seen for OT ADL retraining session with focus on grooming in standing at sink (Min A, vc's for safety and sequencing throughout secondary to impaired cognition), Sitting at sink level for UB/LB bathe and dress (Min-Mod A), Functional mobility and transfer to recliner - pt required step by step instruction for safety and sequencing with RW and ADL tasks, pt is overall min A for short distances in a quiet environment but requires Mod A x1 secondary to retropulsion when ambulating and speaking to spouse noted. Pt is impulsive at times, he would benefit from AIR to assist in maximizing independence with ADL's and self care.    Extremity/Trunk Assessment Upper Extremity Assessment Upper Extremity Assessment: Defer to OT evaluation   Lower Extremity Assessment Lower Extremity Assessment: Defer to PT evaluation        Vision Baseline Vision/History: 1 Wears glasses Patient Visual Report: No change from baseline Vision Assessment?: No  apparent visual deficits   Perception     Praxis      Cognition Arousal/Alertness: Awake/alert Behavior During Therapy: Flat affect, WFL for tasks assessed/performed Overall Cognitive Status: Impaired/Different from baseline Area of Impairment: Memory, Following commands, Awareness, Problem solving, Safety/judgement             Memory: Decreased short-term memory Following Commands: Follows one step commands with increased time   Awareness: Emergent Problem Solving: Slow processing, Decreased initiation, Difficulty sequencing, Requires verbal cues General Comments: Pt's wife is hopeful that the decreased cognition with pass once he has some steroids. Pt has increased lack of safety awareness, limited knowledge of DME use, increased difficulty with command follow all limiting his safe mobility.                   Pertinent Vitals/ Pain       Pain Assessment Pain Assessment: No/denies pain  Home Living Family/patient expects to be discharged to:: Private residence Living Arrangements: Spouse/significant other Available Help at Discharge: Family;Available 24 hours/day Type of Home: House Home Access: Stairs to enter [employer] of Steps: 4 Entrance Stairs-Rails: None Home Layout: Two level Alternate Level Stairs-Number of Steps: Original log cabin house with multiple additions. One step to transition between each addition. Alternate Level Stairs-Rails: None Bathroom Shower/Tub: Tub/shower unit;Walk-in shower   Home Equipment: Shower seat - built Medical sales representative (2 wheels)   Additional Comments: Pt reports that RW is not his, but he has access to it      Prior Functioning/Environment  Please refer to initial OT assessment.    Frequency  Min 2X/week        Progress Toward Goals  OT Goals(current goals can now be found in  the care plan section)  Progress towards OT goals: Progressing toward goals  Acute Rehab OT Goals Patient Stated Goal: Pt  spouse and pt are hopeful for in pt rehab OT Goal Formulation: With patient/family Time For Goal Achievement: 10/06/22 Potential to Achieve Goals: [place] Discharge plan needs to be updated       AM-PAC OT "6 Clicks" Daily Activity     Outcome Measure   Help from another person eating meals?: A Little Help from another person taking care of personal grooming?: A Little Help from another person toileting, which includes using toliet, bedpan, or urinal?: A Lot Help from another person bathing (including washing, rinsing, drying)?: A Lot Help from another person to put on and taking off regular upper body clothing?: A Little Help from another person to put on and taking off regular lower body clothing?: A Lot 6 Click Score: 15    End of Session Equipment Utilized During Treatment: Gait belt;Rolling walker (2 wheels)  OT Visit Diagnosis: Unsteadiness on feet (R26.81);Other abnormalities of gait and mobility (R26.89);History of falling (Z91.81);Other symptoms and signs involving cognitive function   Activity Tolerance Patient tolerated treatment well   Patient Left with family/visitor present;in chair;with call bell/phone within reach;with chair alarm set   Nurse Communication Mobility status;Other (comment) (ADL completed)        Time: 0921-1007 OT Time Calculation (min): 46 min  Charges: OT General Charges $OT Visit: 1 Visit OT Treatments $Self Care/Home Management : 38-52 mins   Carter Kaman Beth Dixon, OTR/L 09/23/2022, 10:29 AM

## 2022-09-23 NOTE — Progress Notes (Signed)
ANTICOAGULATION CONSULT NOTE - Follow-Up Consult  Pharmacy Consult for heparin infusion Indication: atrial fibrillation and acute pulmonary embolus  Allergies  Allergen Reactions   Amlodipine Swelling    Swelling of ankles    Patient Measurements: Height: '5\' 11"'$  (180.3 cm) Weight: 82.8 kg (182 lb 8.7 oz) IBW/kg (Calculated) : 75.3 Heparin Dosing Weight: 82.8 kg  Vital Signs: Temp: 97.8 F (36.6 C) (03/06 0744) Temp Source: Oral (03/06 0744) BP: 129/80 (03/06 0744) Pulse Rate: 64 (03/06 0744)  Labs: Recent Labs    09/21/22 1348 09/21/22 1348 09/21/22 1402 09/21/22 1402 09/21/22 1457 09/21/22 1539 09/21/22 1751 09/21/22 1940 09/21/22 1946 09/21/22 2128 09/22/22 0747 09/22/22 1821 09/23/22 0125  HGB 11.6*  --  11.6*  --   --   --   --   --   --   --  10.6*  --  9.7*  HCT 36.4*  --  34.0*  --   --   --   --   --   --   --  30.8*  --  27.8*  PLT 113*  --   --   --   --   --   --   --   --   --  89*  --  88*  APTT  --   --   --    < >  --   --   --  44*  --   --  186* 75* 89*  LABPROT  --   --   --   --   --   --   --  20.4*  --   --   --   --   --   INR  --   --   --   --   --   --   --  1.8*  --   --   --   --   --   HEPARINUNFRC  --   --   --   --   --   --   --  >1.10*  --   --  >1.10*  --  >1.10*  CREATININE  --    < > 1.50*  --  1.43*  --   --   --   --   --  1.09  --  1.23  CKTOTAL  --   --   --   --   --   --   --  54  --   --   --   --   --   TROPONINIHS  --   --   --   --   --    < > 94*  --  83* 83*  --   --   --    < > = values in this interval not displayed.     Estimated Creatinine Clearance: 56.1 mL/min (by C-G formula based on SCr of 1.23 mg/dL).   Medical History: Past Medical History:  Diagnosis Date   BPH (benign prostatic hypertrophy)    Bradycardia    a. H/o asymptomatic bradycardia.   Coronary artery disease    a. s/p CABG ~2007 with LIMA to LAD, SVG seq to OM1 and OM2, SVG to PDA and SVG to diag. b. Abnormal nuc 03/2015 - s/p DES to  SVG-OM1-OM2. 01/29/17 POBA with cutting balloon to SVG-->OM.  No ischemia Lexi myoview 2021   DCM (dilated cardiomyopathy) ([place]) 09/17/2015   mixed by cMRI 03/2021 with EF 39% with non  viable infarct in basal to mid lateral wall and LGE pattern c/w nonischemic component as well. Mild RV dysfunction RVEF 42%.   Dyslipidemia    Dyspnea    r/t lung mass   ED (erectile dysfunction)    Fear of heights    Goals of care, counseling/discussion 06/19/2020   History of radiation therapy    T spine and left ilium 10/07/2021-10/20/2021  Dr Gery Pray   Hypertension    Non-small cell carcinoma of lung, stage 4, left ([place]) 06/19/2020   OSA (obstructive sleep apnea) 07/12/2015   Moderate OSA with AHI 16.5/hr now on CPAP at 8cm H2O, uses cpap nightly      RBBB    Shoulder, capsulitis, adhesive    Left Shoulder    Medications:  Medications Prior to Admission  Medication Sig Dispense Refill Last Dose   aspirin 81 MG tablet Take 81 mg by mouth at bedtime.    09/21/2022   colchicine 0.6 MG tablet Take 1 tablet (0.6 mg total) by mouth every 8 (eight) hours as needed. 60 tablet 1 unk   Cyanocobalamin (B-12 PO) Take 1,000 mcg by mouth daily.    Past Week   diphenoxylate-atropine (LOMOTIL) 2.5-0.025 MG tablet TAKE 2 TABLETS BY MOUTH AT ONSET OF DIARRHEA THEN TAKE 1 TABLET AFTER EACH LOOSE STOOL MAX 8 TABLETS PER DAY (Patient taking differently: Take 2 tablets by mouth 4 (four) times daily as needed for diarrhea or loose stools. TAKE 2 TABLETS BY MOUTH AT ONSET OF DIARRHEA THEN TAKE 1 TABLET AFTER EACH LOOSE STOOL MAX 8 TABLETS PER DAY) 100 tablet 0 unk   ELIQUIS 5 MG TABS tablet TAKE 1 TABLET BY MOUTH TWICE A DAY 60 tablet 3 09/21/2022 at am   ezetimibe (ZETIA) 10 MG tablet Take 1 tablet (10 mg total) by mouth daily. 90 tablet 3 0000000   folic acid (FOLVITE) 1 MG tablet TAKE ONE TABLET BY MOUTH DAILY START 5 TO 7 DAYS PRIOR TO CHEMO AND CONTINUE UNTIL 21 DAYS AFTER COMPLETES 90 tablet 3 Past Week   levothyroxine  (SYNTHROID) 50 MCG tablet Take 1 tablet (50 mcg total) by mouth daily before breakfast. 30 tablet 3 09/21/2022   metoprolol succinate (TOPROL-XL) 25 MG 24 hr tablet TAKE ONE TABLET BY MOUTH EVERY NIGHT AT BEDTIME 90 tablet 0 Past Week   Multiple Vitamin (MULTIVITAMIN) capsule Take 1 capsule by mouth daily.   09/21/2022   nitroGLYCERIN (NITROSTAT) 0.4 MG SL tablet Place 1 tablet (0.4 mg total) under the tongue every 5 (five) minutes as needed for chest pain. 25 tablet 3 unk   pantoprazole (PROTONIX) 40 MG tablet Take 1 tablet (40 mg total) by mouth daily. 30 tablet 3 09/21/2022   predniSONE (DELTASONE) 10 MG tablet Take 1 tablet (10 mg total) by mouth daily with breakfast. 30 tablet 4 09/21/2022   rosuvastatin (CRESTOR) 40 MG tablet TAKE ONE TABLET BY MOUTH EVERY NIGHT AT BEDTIME (Patient taking differently: Take 40 mg by mouth daily.) 90 tablet 1 Past Week   sacubitril-valsartan (ENTRESTO) 24-26 MG TAKE ONE TABLET BY MOUTH TWICE A DAY 60 tablet 9 Past Week   sertraline (ZOLOFT) 100 MG tablet Take 100 mg by mouth 2 (two) times daily.   Past Week   traMADol (ULTRAM) 50 MG tablet Take 1 tablet (50 mg total) by mouth every 6 (six) hours as needed. 90 tablet 0 unk   oxyCODONE-acetaminophen (PERCOCET) 7.5-325 MG tablet Take 1 tablet by mouth every 4 (four) hours as needed for severe pain. [place]  tablet 0    predniSONE (DELTASONE) 20 MG tablet Take 3 tablets (60 mg total) by mouth daily with breakfast for 5 days, THEN 2 tablets (40 mg total) daily with breakfast for 25 days. Continue 40 mg dose until discontinued by Physician.. (Patient not taking: Reported on 09/22/2022) 65 tablet 0 Not Taking   sucralfate (CARAFATE) 1 g tablet Take 1 tablet (1 g total) by mouth 4 (four) times daily. Dissolve each tablet in 15 cc water before use. (Patient not taking: Reported on 09/22/2022) 120 tablet 2 Completed Course   Scheduled:   aspirin  81 mg Oral QHS   hydrocortisone sod succinate (SOLU-CORTEF) inj  100 mg Intravenous Q8H    levothyroxine  50 mcg Oral QAC breakfast   pantoprazole  40 mg Oral Daily   rosuvastatin  40 mg Oral QHS   sertraline  100 mg Oral BID   Infusions:   heparin 1,100 Units/hr (09/22/22 1320)    Assessment: Pt with a hx of PE in 10/22 presented with a new PE. He has been on apixaban '5mg'$  PO BID for AF, concerning for apixaban failure. Last dose of apixaban was 09/21/22 AM. He has a hx of stage 4 lung CA. Dopplers positive for LLE DVT and RLE superficial thrombus. Pharmacy consulted to dose and manage heparin infusion for treatment of acute PE and Afib.  aPTT 89, therapeutic Heparin level >1.1 (not correlating with aPTT in setting of recent apixaban administration) Current heparin infusion rate: 1100 units/hr  Hgb trending down to 9.7 Plt 88, stable  No s/sx of bleeding reported by RN.   Goal of Therapy:  Heparin level 0.3-0.7 units/ml aPTT: 66-102 Monitor platelets by anticoagulation protocol: Yes   Plan:  Continue heparin infusion at 1100 units/hr Monitor daily CBC, aPTT (until heparin levels are correlating), heparin level, and for s/sx of bleeding Per oncology recommendations, plan to transition to enoxaparin on 09/24/22 (Recommend enoxaparin '80mg'$  [place] q12h)   Luisa Hart, PharmD, BCPS Clinical Pharmacist 09/23/2022 9:24 AM   Please refer to [facility] for pharmacy phone number

## 2022-09-23 NOTE — Progress Notes (Signed)
  Inpatient Rehabilitation Admissions Coordinator   Met with wife at bedside for rehab assessment. Patient off unit at MRI. We discussed goals and expectations of a possible CIR admit. She prefers CIR for rehab. I discussed that I await further medical workup and oncology plans before determining what is the most appropriate rehab venue. Noted plans to change to chemotherapy and that regimen will need to be clarified. I will follow his progress. Family will need to provide 24/7 supervision assist due to his cognition issues after any rehab venue and wife is aware. Please call me with any questions.   Danne Baxter, RN, MSN Rehab Admissions Coordinator [phone]

## 2022-09-23 NOTE — TOC Initial Note (Addendum)
Transition of Care (TOC) - Initial/Assessment Note  Marvetta Gibbons RN, BSN Transitions of Care Unit 4E- RN Case Manager See Treatment Team for direct phone #   Patient Details  Name: John Hinton MRN: [id number] Date of Birth: 11/26/1945  Transition of Care [facility]) CM/SW Contact:    Dawayne Patricia, RN Phone Number: 09/23/2022, 3:25 PM  Clinical Narrative:                 Spoke with wife and pt at bedside to answer questions about transition options to include INPT rehab, STSNF, Home w/ [facility]. Discussed the different levels of rehab and requirements for coverage/participation in each level. Pt's goal is to return home with wife- however wife voiced that pt needs to be mobile and able to ambulate with minimal assist. Their first choice for rehab would be [facility]- INPT rehab, but are open to SNF level if pt not a candidate for INPT rehab.  Will follow pt progression to see what level of rehab would be most appropriate. PT has seen pt this am and to recommend AIR-note pending. CM will have liaison screen pt later today for appropriateness.   We will continue to monitor patient advancement through interdisciplinary progression rounds. If new patient transition needs arise, please place a TOC consult.  Expected Discharge Plan: IP Rehab Facility Barriers to Discharge: Continued Medical Work up   Patient Goals and CMS Choice Patient states their goals for this hospitalization and ongoing recovery are:: to return home CMS Medicare.gov Compare Post Acute Care list provided to:: Patient Choice offered to / list presented to : Patient, Spouse      Expected Discharge Plan and Services     Post Acute Care Choice: IP Rehab Living arrangements for the past 2 months: Single Family Home                                      Prior Living Arrangements/Services Living arrangements for the past 2 months: Single Family Home Lives with:: Spouse                   Activities of Daily  Living Home Assistive Devices/Equipment: Environmental consultant (specify type) ADL Screening (condition at time of admission) Patient's cognitive ability adequate to safely complete daily activities?: Yes Is the patient deaf or have difficulty hearing?: No Does the patient have difficulty seeing, even when wearing glasses/contacts?: No Does the patient have difficulty concentrating, remembering, or making decisions?: No Patient able to express need for assistance with ADLs?: Yes Does the patient have difficulty dressing or bathing?: No Independently performs ADLs?: Yes (appropriate for developmental age) Does the patient have difficulty walking or climbing stairs?: Yes Weakness of Legs: Both Weakness of Arms/Hands: Right  Permission Sought/Granted                  Emotional Assessment              Admission diagnosis:  Other specified hypotension [I95.89] Pulmonary embolism (HCC) [I26.99] Hypoxia [R09.02] Pleural effusion on left [J90] Atrial fibrillation with RVR ([place]) [I48.91] Other acute pulmonary embolism with acute cor pulmonale ([place]) [I26.09] Acute pulmonary embolism ([place]) [I26.99] Patient Active Problem List   Diagnosis Date Noted   DVT (deep venous thrombosis) ([place]) 09/22/2022   Acute respiratory failure with hypoxia ([place]) [id number]   Chronic systolic CHF (congestive heart failure) ([place]) 09/21/2022   Hypokalemia 09/21/2022   Pleural effusion, malignant  09/21/2022   Fall at home, initial encounter 09/21/2022   Paroxysmal atrial fibrillation with RVR ([place]) 09/21/2022   Hypothyroidism 09/21/2022   Ataxia 08/19/2022   Malignant neoplasm of lung ([place]) 05/29/2021   Acute pulmonary embolism ([place]) 05/04/2021   Non-small cell carcinoma of lung, stage 4, left ([place]) 06/19/2020   Goals of care, counseling/discussion 06/19/2020   S/P partial lobectomy of lung 05/22/2020   DCM (dilated cardiomyopathy) ([place]) 09/17/2015   OSA (obstructive sleep apnea) 07/12/2015   Bradycardia  08/29/2013   CAD (coronary artery disease), native coronary artery    RBBB    Essential hypertension, benign 08/18/2013   Hyperlipemia 08/18/2013   PCP:  Corliss Blacker, MD Pharmacy:   [facility] [id number], [place] [address] Phone: [phone] Fax: [phone]     Social Determinants of Health (SDOH) Social History: SDOH Screenings   Food Insecurity: No Food Insecurity (09/22/2022)  Housing: Low Risk  (09/22/2022)  Transportation Needs: No Transportation Needs (09/22/2022)  Utilities: Not At Risk (09/22/2022)  Financial Resource Strain: Low Risk  (06/18/2022)  Stress: No Stress Concern Present (06/18/2022)  Tobacco Use: Medium Risk (09/21/2022)   SDOH Interventions:     Readmission Risk Interventions    08/25/2022    2:15 PM  Readmission Risk Prevention Plan  Transportation Screening Complete  PCP or Specialist Appt within 5-7 Days Complete  Home Care Screening Complete  Medication Review (RN CM) Complete

## 2022-09-24 ENCOUNTER — Encounter: Payer: Self-pay | Admitting: Hematology & Oncology

## 2022-09-24 DIAGNOSIS — I2699 Other pulmonary embolism without acute cor pulmonale: Secondary | ICD-10-CM | POA: Diagnosis not present

## 2022-09-24 DIAGNOSIS — J90 Pleural effusion, not elsewhere classified: Secondary | ICD-10-CM | POA: Diagnosis not present

## 2022-09-24 LAB — COMPREHENSIVE METABOLIC PANEL
ALT: 18 U/L (ref 0–44)
AST: 16 U/L (ref 15–41)
Albumin: 2.8 g/dL — ABNORMAL LOW (ref 3.5–5.0)
Alkaline Phosphatase: 43 U/L (ref 38–126)
Anion gap: 11 (ref 5–15)
BUN: 24 mg/dL — ABNORMAL HIGH (ref 8–23)
CO2: 21 mmol/L — ABNORMAL LOW (ref 22–32)
Calcium: 7.9 mg/dL — ABNORMAL LOW (ref 8.9–10.3)
Chloride: 108 mmol/L (ref 98–111)
Creatinine, Ser: 1.02 mg/dL (ref 0.61–1.24)
GFR, Estimated: >60 mL/min (ref >60)
Glucose, Bld: 142 mg/dL — ABNORMAL HIGH (ref 70–99)
Potassium: 3.8 mmol/L (ref 3.5–5.1)
Sodium: 140 mmol/L (ref 135–145)
Total Bilirubin: 0.3 mg/dL (ref 0.3–1.2)
Total Protein: 5.1 g/dL — ABNORMAL LOW (ref 6.5–8.1)

## 2022-09-24 LAB — CBC
HCT: 27.2 % — ABNORMAL LOW (ref 39.0–52.0)
Hemoglobin: 9.4 g/dL — ABNORMAL LOW (ref 13.0–17.0)
MCH: 33.5 pg (ref 26.0–34.0)
MCHC: 34.6 g/dL (ref 30.0–36.0)
MCV: 96.8 fL (ref 80.0–100.0)
Platelets: 85 10*3/uL — ABNORMAL LOW (ref 150–400)
RBC: 2.81 MIL/uL — ABNORMAL LOW (ref 4.22–5.81)
RDW: 14.1 % (ref 11.5–15.5)
WBC: 5.4 10*3/uL (ref 4.0–10.5)
nRBC: 0 % (ref 0.0–0.2)

## 2022-09-24 LAB — RETICULOCYTES
Immature Retic Fract: 5.6 % (ref 2.3–15.9)
RBC.: 2.88 MIL/uL — ABNORMAL LOW (ref 4.22–5.81)
Retic Count, Absolute: 30.8 10*3/uL (ref 19.0–186.0)
Retic Ct Pct: 1.1 % (ref 0.4–3.1)

## 2022-09-24 LAB — HEPARIN LEVEL (UNFRACTIONATED): Heparin Unfractionated: 0.72 IU/mL — ABNORMAL HIGH (ref 0.30–0.70)

## 2022-09-24 LAB — LACTATE DEHYDROGENASE: LDH: 190 U/L (ref 98–192)

## 2022-09-24 LAB — APTT
aPTT: 52 seconds — ABNORMAL HIGH (ref 24–36)
aPTT: 47 seconds — ABNORMAL HIGH (ref 24–36)

## 2022-09-24 LAB — MAGNESIUM: Magnesium: 2.4 mg/dL (ref 1.7–2.4)

## 2022-09-24 LAB — IRON AND TIBC
Iron: 149 ug/dL (ref 45–182)
Saturation Ratios: 61 % — ABNORMAL HIGH (ref 17.9–39.5)
TIBC: 245 ug/dL — ABNORMAL LOW (ref 250–450)
UIBC: 96 ug/dL

## 2022-09-24 MED ORDER — PREDNISONE 10 MG PO TABS
10.0000 mg | ORAL_TABLET | Freq: Every day | ORAL | Status: DC
  Administered 2022-09-25: 10 mg via ORAL
  Filled 2022-09-24: qty 1

## 2022-09-24 MED ORDER — ADULT MULTIVITAMIN W/MINERALS CH
1.0000 | ORAL_TABLET | Freq: Every day | ORAL | Status: DC
  Administered 2022-09-24 – 2022-09-25 (×2): 1 via ORAL
  Filled 2022-09-24 (×2): qty 1

## 2022-09-24 MED ORDER — EZETIMIBE 10 MG PO TABS
10.0000 mg | ORAL_TABLET | Freq: Every day | ORAL | Status: DC
  Administered 2022-09-24 – 2022-09-25 (×2): 10 mg via ORAL
  Filled 2022-09-24 (×2): qty 1

## 2022-09-24 MED ORDER — ENOXAPARIN (LOVENOX) PATIENT EDUCATION KIT
PACK | Freq: Once | Status: DC
  Filled 2022-09-24: qty 1

## 2022-09-24 MED ORDER — MULTIVITAMINS PO CAPS: 1.0000 | ORAL_CAPSULE | Freq: Every day | ORAL | Status: DC

## 2022-09-24 MED ORDER — ALUM & MAG HYDROXIDE-SIMETH 200-200-20 MG/5ML PO SUSP
30.0000 mL | ORAL | Status: DC | PRN
  Administered 2022-09-25 (×2): 30 mL via ORAL
  Filled 2022-09-24 (×2): qty 30

## 2022-09-24 MED ORDER — HEPARIN BOLUS VIA INFUSION
2000.0000 [IU] | Freq: Once | INTRAVENOUS | Status: AC
  Administered 2022-09-24: 2000 [IU] via INTRAVENOUS
  Filled 2022-09-24: qty 2000

## 2022-09-24 NOTE — Progress Notes (Addendum)
Physical Therapy Treatment Patient Details Name: John Hinton MRN: [id number] DOB: 08/30/1945 Today's Date: 09/24/2022   History of Present Illness 77 y.o. male with medical history significant of lung cancer, A-fib with RVR, PE, systolic CHF EF [id number], gout, hypothyroidism, GERD, paraneoplastic syndrome.  Admitted for pleural effusion on left, acute pulmonary embolism with acute cor pulmonale.    PT Comments    Pt received in chair, agreeable to therapy session and with good participation and tolerance for transfer and gait training compared with previous session. Pt making excellent progress toward goals, able to perform functional mobility tasks with up to min guard, including community distance ambulation task with RW and VSS on RA throughout. Pt with mild to moderate dyspnea on exertion. Pt scored 13/24 on Dynamic Gait Index. Scores of 19 or less are predictive of falls in older community living adults, therefore continue to recommend consistent supervision/assist at home. Discussed disposition with pt/spouse and both agreeable with discharge to home with OPPT once medically cleared, as his balance and cognition have both remarkably improved since initial evaluation. Discussed with supervising PT Beth [facility] and updated below.  Recommendations for follow up therapy are one component of a multi-disciplinary discharge planning process, led by the attending physician.  Recommendations may be updated based on patient status, additional functional criteria and insurance authorization.  Follow Up Recommendations  Outpatient PT     Assistance Recommended at Discharge Frequent or constant Supervision/Assistance  Patient can return home with the following A little help with walking and/or transfers;A little help with bathing/dressing/bathroom;Assistance with cooking/housework;Assist for transportation;Help with stairs or ramp for entrance   Equipment Recommendations  BSC/3in1 (pending progress)     Recommendations for Other Services       Precautions / Restrictions Precautions Precautions: Fall Precaution Comments: h/o falls (last fell at home 09/21/22) per pt spouse     Mobility  Bed Mobility Overal bed mobility: Needs Assistance             General bed mobility comments: pt received up in the chair.    Transfers Overall transfer level: Needs assistance Equipment used: Rolling walker (2 wheels) Transfers: Sit to/from Stand, Bed to chair/wheelchair/BSC Sit to Stand: Min guard, Supervision           General transfer comment: Pt needing min guard initially from chair, then Supervision to stand from toilet seat using wall rail. Cues for safe UE placement needed with fatigue as pt forgets to reach back for chair from RW.    Ambulation/Gait Ambulation/Gait assistance: Supervision, Min guard Gait Distance (Feet): 400 Feet (including x2 brief standing breaks) Assistive device: Rolling walker (2 wheels) Gait Pattern/deviations: Step-through pattern, Decreased step length - right, Decreased step length - left, Narrow base of support, Drifts right/left Gait velocity: variable     General Gait Details: SpO2 WFL on RA throughout (95% and greater), DOE 2/4 with exertion. Pt with intermittent mild drifting toward R side of hallway but able to correct with cues, pt needed x2 episode of min guard assist initially getting into bathroom and turning out into hallway but able to self-correct with RW and MGA for safety. Pt performed DGI tasks as detailed below.   Stairs Stairs:  (defer due to DOE and pt fatigue after performing full unit hallway distance.)           Wheelchair Mobility    Modified Rankin (Stroke Patients Only)       Balance Overall balance assessment: Needs assistance, History of Falls Sitting-balance support: Feet supported  Sitting balance-Leahy Scale: Good     Standing balance support: Bilateral upper extremity supported, Reliant on assistive  device for balance Standing balance-Leahy Scale: Fair (with RW) Standing balance comment: pt able to reach for Purell sanitizer and apply standing unsupported, but used RW for gait due to mild instability for dynamic tasks                 Standardized Balance Assessment Standardized Balance Assessment : Dynamic Gait Index   Dynamic Gait Index Level Surface: Mild Impairment Change in Gait Speed: Mild Impairment Gait with Horizontal Head Turns: Moderate Impairment Gait with Vertical Head Turns: Moderate Impairment Gait and Pivot Turn: Mild Impairment Step Over Obstacle: Mild Impairment Step Around Obstacles: Mild Impairment Steps: Moderate Impairment (anticipated; simulated with RW support) Total Score: 13      Cognition Arousal/Alertness: Awake/alert Behavior During Therapy: WFL for tasks assessed/performed Overall Cognitive Status: Impaired/Different from baseline Area of Impairment: Memory, Awareness, Problem solving, Safety/judgement                       Following Commands: Follows one step commands consistently, Follows multi-step commands with increased time Safety/Judgement: Decreased awareness of safety Awareness: Emergent Problem Solving: Requires verbal cues General Comments: Pt pleasantly cooperative, eager to mobilize, following most simple commands and demonstrates some difficulty with dual tasking (see DGI section), but improved balance and awareness during session today. Pt spouse present and reports this is an improvement from admission. Spouse is able to provide initial consistent supervision/assist upon DC.        Exercises      General Comments General comments (skin integrity, edema, etc.): SpO2 95% and greater on RA with exertional tasks, DOE 2/4. HR to 79 bpm with exertion, 62 bpm resting in chair; no dizziness reported and BP not further assessed.      Pertinent Vitals/Pain      Home Living                          Prior  Function            PT Goals (current goals can now be found in the care plan section) Acute Rehab PT Goals Patient Stated Goal: to get stronger and go back to OPPT PT Goal Formulation: With patient/family Time For Goal Achievement: 10/06/22 Progress towards PT goals: Progressing toward goals    Frequency    Min 3X/week      PT Plan Discharge plan needs to be updated    Co-evaluation              AM-PAC PT "6 Clicks" Mobility   Outcome Measure  Help needed turning from your back to your side while in a flat bed without using bedrails?: None Help needed moving from lying on your back to sitting on the side of a flat bed without using bedrails?: A Little Help needed moving to and from a bed to a chair (including a wheelchair)?: A Little Help needed standing up from a chair using your arms (e.g., wheelchair or bedside chair)?: A Little Help needed to walk in hospital room?: A Little Help needed climbing 3-5 steps with a railing? : A Little 6 Click Score: 19    End of Session Equipment Utilized During Treatment: Gait belt Activity Tolerance: Patient tolerated treatment well Patient left: in chair;with call bell/phone within reach;with family/visitor present;Other (comment) (spouse present and will notify RN if pt has mobility needs) Nurse Communication:  Mobility status;Other (comment) (pt can mobilize in room/hallway with RW and +1 for safety/line mgmt) PT Visit Diagnosis: Unsteadiness on feet (R26.81);History of falling (Z91.81);Muscle weakness (generalized) (M62.81);Difficulty in walking, not elsewhere classified (R26.2)     Time: [id number] PT Time Calculation (min) (ACUTE ONLY): 32 min  Charges:  $Gait Training: 23-37 mins                     Kacen Mellinger P., PTA Acute Rehabilitation Services Secure Chat Preferred 9a-5:30pm Office: [place] 09/24/2022, 5:17 PM

## 2022-09-24 NOTE — Progress Notes (Signed)
John Hinton seems to be a little bit better today.  He seems to be more alert and oriented.  It was nice talking to him this morning.  He had an MRI of the brain yesterday.  This was negative for any obvious metastatic disease.  Continues on the heparin infusion.  I think that, tomorrow, we can probably switch him over to Lovenox.  I think this would be a very reasonable approach.  I would have him on twice daily dosing of Lovenox.  He is due for a PET scan on Monday.  However, by his last CT scan it looks like there was disease progression.  I talked him about the fact that our only option now would be chemotherapy.  I am not sure if he could incorporate immunotherapy because of the possibility of encephalitis could have been caused by the immunotherapy.  He wants to think about this.  I told him that if he did not wish to have chemotherapy, I would totally respect that.  I would also make sure that we would get Hospice involved.  I explained to him that Hospice is different and that I had patient on hospice now for over a year.  They would at least be aware of him and no of him when the time comes that there is services would be required.  I told him that if he did do chemotherapy, that he would need to have a Port-A-Cath placed.  He seemed to understand all this.  His labs show white cell count of 5.4.  Hemoglobin 9.4.  Platelet count 85,000.  Iron saturation 61%.  His BUN is 24 creatinine 1.02.  Calcium is 7.9 with an albumin of 2.8.  It would be nice if he could go to some inpatient rehab.  Again I am not sure he would qualify for this.  I do think that however, he does seem to be more alert this morning.  His vital signs are temperature 98.1.  Pulse 56.  Blood pressure 131/78.  His lungs sound clear bilaterally.  He has decent breath sounds bilaterally.  Cardiac exam regular rate and rhythm.  Abdomen is soft.  Bowel sounds are present.  There is no guarding or rebound tenderness.  Currently  shows no clubbing, cyanosis or edema.  He has good range of motion of his joints.  No venous cords noted in the legs.  Neurological exam is nonfocal.  John Hinton is a very nice 77 year old white male.  He has metastatic adenocarcinoma of the lung.  He has currently been on targeted therapy with adagrasib.  Unfortunately, I think he is progressing.  I told him that he has time to think about whether or not he wants chemotherapy and a Port-A-Cath.  He will talk to his wife about this.  If he can get into CIR, that would be nice for his rehab.  Again, I probably keep him on heparin today and then switch over to Lovenox tomorrow.  We would have to make sure that his insurance will cover the Lovenox as an outpatient.  I do appreciate the wonderful care that he is getting from everybody upon 4 E.  John Haw, MD   Romans 8:31

## 2022-09-24 NOTE — Progress Notes (Signed)
Physical Therapy Treatment Patient Details Name: John Hinton MRN: [id number] DOB: 1945-12-16 Today's Date: 09/24/2022   History of Present Illness 77 y.o. male with medical history significant of lung cancer, A-fib with RVR, PE, systolic CHF EF [id number], gout, hypothyroidism, GERD, paraneoplastic syndrome.  Admitted for pleural effusion on left, acute pulmonary embolism with acute cor pulmonale.    PT Comments    Pt received in chair, pt/spouse requesting PTA assist with transferring him back from chair to bed, pt agreeable to additional instruction on HEP and use of incentive spirometer to build pulmonary endurance/strength. Pt min guard assist for step pivot and transfer from chair>bed with HHA. Board updated based on pt's progress. Pt continues to benefit from PT services to progress toward functional mobility goals.    Recommendations for follow up therapy are one component of a multi-disciplinary discharge planning process, led by the attending physician.  Recommendations may be updated based on patient status, additional functional criteria and insurance authorization.  Follow Up Recommendations  Outpatient PT     Assistance Recommended at Discharge Frequent or constant Supervision/Assistance  Patient can return home with the following A little help with walking and/or transfers;A little help with bathing/dressing/bathroom;Assistance with cooking/housework;Assist for transportation;Help with stairs or ramp for entrance   Equipment Recommendations  BSC/3in1 (pending progress)    Recommendations for Other Services       Precautions / Restrictions Precautions Precautions: Fall Precaution Comments: h/o falls (last fell at home 09/21/22) per pt spouse Restrictions Weight Bearing Restrictions: No     Mobility  Bed Mobility Overal bed mobility: Needs Assistance Bed Mobility: Sit to Supine       Sit to supine: Supervision   General bed mobility comments: assist with line mgmt     Transfers Overall transfer level: Needs assistance Equipment used: 1 person hand held assist Transfers: Sit to/from Stand, Bed to chair/wheelchair/BSC Sit to Stand: Min guard   Step pivot transfers: Min guard       General transfer comment: min guard with no AD/HHA for safety while pivoting from chair>bed. Mild DOE    Ambulation/Gait Ambulation/Gait assistance: Supervision, Min guard Gait Distance (Feet): 400 Feet (including x2 brief standing breaks) Assistive device: Rolling walker (2 wheels) Gait Pattern/deviations: Step-through pattern, Decreased step length - right, Decreased step length - left, Narrow base of support, Drifts right/left Gait velocity: variable     General Gait Details: defer, pt fatigued after earlier gait trial and wanting to return to bed, agreeable to instruction on HEP   Stairs Stairs:  (defer due to DOE and pt fatigue after performing full unit hallway distance.)           Wheelchair Mobility    Modified Rankin (Stroke Patients Only)       Balance Overall balance assessment: Needs assistance, History of Falls Sitting-balance support: Feet supported Sitting balance-Leahy Scale: Good     Standing balance support: Single extremity supported, During functional activity Standing balance-Leahy Scale: Fair Standing balance comment: +1 HHA for short distance pivot                 Standardized Balance Assessment Standardized Balance Assessment : Dynamic Gait Index   Dynamic Gait Index Level Surface: Mild Impairment Change in Gait Speed: Mild Impairment Gait with Horizontal Head Turns: Moderate Impairment Gait with Vertical Head Turns: Moderate Impairment Gait and Pivot Turn: Mild Impairment Step Over Obstacle: Mild Impairment Step Around Obstacles: Mild Impairment Steps: Moderate Impairment (anticipated; simulated with RW support) Total Score: 13  Cognition Arousal/Alertness: Awake/alert Behavior During Therapy: WFL for  tasks assessed/performed Overall Cognitive Status: Impaired/Different from baseline Area of Impairment: Memory, Awareness, Problem solving, Safety/judgement                       Following Commands: Follows one step commands consistently, Follows multi-step commands with increased time Safety/Judgement: Decreased awareness of safety Awareness: Emergent Problem Solving: Requires verbal cues General Comments: Pt pleasantly cooperative, eager to mobilize, following most simple commands and demonstrates some difficulty with dual tasking (see DGI section), but improved balance and awareness during session today. Pt spouse present and reports this is an improvement from admission. Spouse is able to provide initial consistent supervision/assist upon DC.        Exercises Other Exercises Other Exercises: IS x 2 reps, ~1228m, reviewed frequency/technique Other Exercises: Reviewed supine/seated and standing BLE exercises and need for supervision with all standing exercises due to high fall risk, pt/spouse receptive, see below Other Exercises: supine BLE AROM: ankle pumps, SAQ (bed chair posture) x5-10 reps ea for teachback Other Exercises: also verbal/visual demo for supine hip abduction, heel slides, hip ADduction pillow squeezes, seated marching, LAQ, reciprocal sit<>stand x5 reps depending on dypsnea and frequency for supine vs seated/standing exercises.    General Comments General comments (skin integrity, edema, etc.): SpO2 and HR WFL on RA      Pertinent Vitals/Pain      Home Living                          Prior Function            PT Goals (current goals can now be found in the care plan section) Acute Rehab PT Goals Patient Stated Goal: to get stronger and go back to OPPT PT Goal Formulation: With patient/family Time For Goal Achievement: 10/06/22 Progress towards PT goals: Progressing toward goals    Frequency    Min 3X/week      PT Plan Current  plan remains appropriate    Co-evaluation              AM-PAC PT "6 Clicks" Mobility   Outcome Measure  Help needed turning from your back to your side while in a flat bed without using bedrails?: None Help needed moving from lying on your back to sitting on the side of a flat bed without using bedrails?: A Little Help needed moving to and from a bed to a chair (including a wheelchair)?: A Little Help needed standing up from a chair using your arms (e.g., wheelchair or bedside chair)?: A Little Help needed to walk in hospital room?: A Little Help needed climbing 3-5 steps with a railing? : A Little 6 Click Score: 19    End of Session Equipment Utilized During Treatment: Gait belt Activity Tolerance: Patient tolerated treatment well;Patient limited by fatigue Patient left: with call bell/phone within reach;with family/visitor present;Other (comment);in bed;with bed alarm set (spouse present and will notify RN if pt has mobility needs) Nurse Communication: Mobility status;Other (comment) (pt can mobilize in room/hallway with RW and +1 for safety/line mgmt (updated his board)) PT Visit Diagnosis: Unsteadiness on feet (R26.81);History of falling (Z91.81);Muscle weakness (generalized) (M62.81);Difficulty in walking, not elsewhere classified (R26.2)     Time: [id number] Time Calculation (min) (ACUTE ONLY): 14 min  Charges:  $Gait Training: 23-37 mins $Therapeutic Activity: 8-22 mins  Houston Siren., PTA Acute Rehabilitation Services Secure Chat Preferred 9a-5:30pm Office: [place] 09/24/2022, 5:31 PM

## 2022-09-24 NOTE — Progress Notes (Signed)
Pharmacist Heart Failure Core Measure Documentation  Assessment: John Hinton has an EF documented as 50% on 07/2022 by ECHO.  Rationale: Heart failure patients with left ventricular systolic dysfunction (LVSD) and an EF < 40% should be prescribed an angiotensin converting enzyme inhibitor (ACEI) or angiotensin receptor blocker (ARB) at discharge unless a contraindication is documented in the medical record.  This patient is not currently on an ACEI or ARB for HF.  This note is being placed in the record in order to provide documentation that a contraindication to the use of these agents is present for this encounter.  ACE Inhibitor or Angiotensin Receptor Blocker is contraindicated (specify all that apply)  '[]'$   ACEI allergy AND ARB allergy '[]'$   Angioedema '[]'$   Moderate or severe aortic stenosis '[]'$   Hyperkalemia '[x]'$   Hypotension '[]'$   Renal artery stenosis '[]'$   Worsening renal function, preexisting renal disease or dysfunction  Patient on Entresto prior to admission - currently held for low blood pressure. Monitor to resume.   John Hinton 09/24/2022 2:29 PM

## 2022-09-24 NOTE — Progress Notes (Signed)
ANTICOAGULATION CONSULT NOTE - Follow-Up Consult  Pharmacy Consult for heparin infusion Indication: atrial fibrillation and acute pulmonary embolus  Allergies  Allergen Reactions   Amlodipine Swelling    Swelling of ankles    Patient Measurements: Height: '5\' 11"'$  (180.3 cm) Weight: 82.8 kg (182 lb 8.7 oz) IBW/kg (Calculated) : 75.3 Heparin Dosing Weight: 82.8 kg  Vital Signs: Temp: 98.3 F (36.8 C) (03/07 1920) Temp Source: Oral (03/07 1920) BP: 136/69 (03/07 1920) Pulse Rate: 61 (03/07 1920)  Labs: Recent Labs     0000 09/21/22 2128 09/22/22 0747 09/22/22 1821 09/23/22 0125 09/24/22 0133 09/24/22 1853  HGB   < >  --  10.6*  --  9.7* 9.4*  --   HCT  --   --  30.8*  --  27.8* 27.2*  --   PLT  --   --  89*  --  88* 85*  --   APTT  --   --  186*   < > 89* 52* 47*  HEPARINUNFRC  --   --  >1.10*  --  >1.10* 0.72*  --   CREATININE  --   --  1.09  --  1.23 1.02  --   TROPONINIHS  --  83*  --   --   --   --   --    < > = values in this interval not displayed.     Estimated Creatinine Clearance: 67.7 mL/min (by C-G formula based on SCr of 1.02 mg/dL).   Medical History: Past Medical History:  Diagnosis Date   BPH (benign prostatic hypertrophy)    Bradycardia    a. H/o asymptomatic bradycardia.   Coronary artery disease    a. s/p CABG ~2007 with LIMA to LAD, SVG seq to OM1 and OM2, SVG to PDA and SVG to diag. b. Abnormal nuc 03/2015 - s/p DES to SVG-OM1-OM2. 01/29/17 POBA with cutting balloon to SVG-->OM.  No ischemia Lexi myoview 2021   DCM (dilated cardiomyopathy) ([place]) 09/17/2015   mixed by cMRI 03/2021 with EF 39% with non viable infarct in basal to mid lateral wall and LGE pattern c/w nonischemic component as well. Mild RV dysfunction RVEF 42%.   Dyslipidemia    Dyspnea    r/t lung mass   ED (erectile dysfunction)    Fear of heights    Goals of care, counseling/discussion 06/19/2020   History of radiation therapy    T spine and left ilium 10/07/2021-10/20/2021  Dr  Gery Pray   Hypertension    Non-small cell carcinoma of lung, stage 4, left ([place]) 06/19/2020   OSA (obstructive sleep apnea) 07/12/2015   Moderate OSA with AHI 16.5/hr now on CPAP at 8cm H2O, uses cpap nightly      RBBB    Shoulder, capsulitis, adhesive    Left Shoulder    Medications:  Medications Prior to Admission  Medication Sig Dispense Refill Last Dose   aspirin 81 MG tablet Take 81 mg by mouth at bedtime.    09/21/2022   colchicine 0.6 MG tablet Take 1 tablet (0.6 mg total) by mouth every 8 (eight) hours as needed. 60 tablet 1 unk   Cyanocobalamin (B-12 PO) Take 1,000 mcg by mouth daily.    Past Week   diphenoxylate-atropine (LOMOTIL) 2.5-0.025 MG tablet TAKE 2 TABLETS BY MOUTH AT ONSET OF DIARRHEA THEN TAKE 1 TABLET AFTER EACH LOOSE STOOL MAX 8 TABLETS PER DAY (Patient taking differently: Take 2 tablets by mouth 4 (four) times daily as needed for diarrhea  or loose stools. TAKE 2 TABLETS BY MOUTH AT ONSET OF DIARRHEA THEN TAKE 1 TABLET AFTER EACH LOOSE STOOL MAX 8 TABLETS PER DAY) 100 tablet 0 unk   ELIQUIS 5 MG TABS tablet TAKE 1 TABLET BY MOUTH TWICE A DAY 60 tablet 3 09/21/2022 at am   ezetimibe (ZETIA) 10 MG tablet Take 1 tablet (10 mg total) by mouth daily. 90 tablet 3 0000000   folic acid (FOLVITE) 1 MG tablet TAKE ONE TABLET BY MOUTH DAILY START 5 TO 7 DAYS PRIOR TO CHEMO AND CONTINUE UNTIL 21 DAYS AFTER COMPLETES 90 tablet 3 Past Week   levothyroxine (SYNTHROID) 50 MCG tablet Take 1 tablet (50 mcg total) by mouth daily before breakfast. 30 tablet 3 09/21/2022   metoprolol succinate (TOPROL-XL) 25 MG 24 hr tablet TAKE ONE TABLET BY MOUTH EVERY NIGHT AT BEDTIME 90 tablet 0 Past Week   Multiple Vitamin (MULTIVITAMIN) capsule Take 1 capsule by mouth daily.   09/21/2022   nitroGLYCERIN (NITROSTAT) 0.4 MG SL tablet Place 1 tablet (0.4 mg total) under the tongue every 5 (five) minutes as needed for chest pain. 25 tablet 3 unk   pantoprazole (PROTONIX) 40 MG tablet Take 1 tablet (40 mg  total) by mouth daily. 30 tablet 3 09/21/2022   predniSONE (DELTASONE) 10 MG tablet Take 1 tablet (10 mg total) by mouth daily with breakfast. 30 tablet 4 09/21/2022   rosuvastatin (CRESTOR) 40 MG tablet TAKE ONE TABLET BY MOUTH EVERY NIGHT AT BEDTIME (Patient taking differently: Take 40 mg by mouth daily.) 90 tablet 1 Past Week   sacubitril-valsartan (ENTRESTO) 24-26 MG TAKE ONE TABLET BY MOUTH TWICE A DAY 60 tablet 9 Past Week   sertraline (ZOLOFT) 100 MG tablet Take 100 mg by mouth 2 (two) times daily.   Past Week   traMADol (ULTRAM) 50 MG tablet Take 1 tablet (50 mg total) by mouth every 6 (six) hours as needed. 90 tablet 0 unk   oxyCODONE-acetaminophen (PERCOCET) 7.5-325 MG tablet Take 1 tablet by mouth every 4 (four) hours as needed for severe pain. 60 tablet 0    Scheduled:   aspirin  81 mg Oral QHS   ezetimibe  10 mg Oral Daily   heparin  2,000 Units Intravenous Once   hydrocortisone sod succinate (SOLU-CORTEF) inj  75 mg Intravenous Q12H   levothyroxine  50 mcg Oral QAC breakfast   multivitamin with minerals  1 tablet Oral Daily   pantoprazole  40 mg Oral Daily   [START ON 09/25/2022] predniSONE  10 mg Oral Q breakfast   rosuvastatin  40 mg Oral QHS   sertraline  100 mg Oral BID   Infusions:   heparin 1,250 Units/hr (09/24/22 1108)    Assessment: Pt with a hx of PE in 10/22 presented with a new PE. He has been on apixaban '5mg'$  PO BID for AF, concerning for apixaban failure. Last dose of apixaban was 09/21/22 AM. He has a hx of stage 4 lung CA. Dopplers positive for LLE DVT and RLE superficial thrombus. Pharmacy consulted to dose and manage heparin infusion for treatment of acute PE and Afib.  aPTT down at 52, sub-therapeutic Heparin level trending down at 0.72 (still not correlating with aPTT in setting of recent apixaban administration) Current heparin infusion rate: 1100 units/hr  Hgb trending down to 9.4.  Plt 85, low-stable.  No s/sx of bleeding reported by RN.   PTT came back  subtherapeutic this PM. No issue with drip per Rn. Plan to change to Lovenox in  AM. We will give a small bolus and increase rate.   Goal of Therapy:  Heparin level 0.3-0.7 units/ml aPTT: 66-102 Monitor platelets by anticoagulation protocol: Yes   Plan:  Heparin bolus 2000 units x1 Increase heparin infusion to 1400 units/hr. Recheck aPTT/HL in AM Monitor daily CBC, aPTT (until heparin levels are correlating), heparin level, and for s/sx of bleeding Per oncology recommendations, plan to transition to enoxaparin on 09/25/22 per Dr. Antonieta Pert note today (Recommend enoxaparin '80mg'$  [place] q12h)  Onnie Boer, PharmD, BCIDP, AAHIVP, CPP Infectious Disease Pharmacist 09/24/2022 8:01 PM

## 2022-09-24 NOTE — Progress Notes (Signed)
Inpatient Rehabilitation Admissions Coordinator   I met with patient and his wife at bedside. We discussed goals and expectations of a possible CIR admit. He and his wife would like to further discuss his chemotherapy options with Dr Marin Olp as last chemotherapy was difficult . Wife to further discuss with patient and then she will contact Dr Marin Olp to further discuss.  Danne Baxter, RN, MSN Rehab Admissions Coordinator [phone] 09/24/2022 11:25 AM

## 2022-09-24 NOTE — Progress Notes (Signed)
PROGRESS NOTE   John Hinton  [id number]    DOB: February 09, 1946    DOA: 09/21/2022  PCP: Corliss Blacker, MD   I have briefly reviewed patients previous medical records in [facility].  Chief Complaint  Patient presents with   Shortness of Breath    Brief Narrative:  John Hinton is a 77 yo married male with PMH Stage IV adenocarcinoma LUL s/p resection (mets to right lung, B/L adrenals, lumbar and iliac bones), follows with Dr. Marin Olp and has been on Xgeva q59month and ongoing Eliquis for history of PE. He has had some cognitive impairment and has been evaluated by Neurology recently and also undergone paraneoplastic workup and infectious workup with LP. This appears negative and that his cognition may be due to immunotherapy per oncology. Due to increase in unsteadiness he underwent recent MRI brain on 08/11/2022.  This was negative for acute abnormalities nor any evidence of metastatic disease. For this hospitalization, he presented with worsening shortness of breath for approximately 2 weeks.  [place] he was feeling short of breath which did not seem to improve throughout the trip and then worsened again upon returning back home.  As per spouse's report, for almost a week, patient had oxygen saturations in the low 80s with little to no oxygen support. CTA chest confirmed acute occlusive lobar and segmental PE in the right middle and lower lobes.  He was also noted to be hypotensive, in A-fib with RVR, and hypoxic on admission. Pulmonology was consulted.  He was not felt to immediately warrant lytic therapy but if were to become hemodynamically unstable then could be further considered with understanding of increased bleeding risk. He was started on heparin drip with plans to transition to full dose Lovenox due to failed Eliquis treatment.     Assessment & Plan:  Principal Problem:   Acute pulmonary embolism (HCC) Active  Problems:   DVT (deep venous thrombosis) (HCC)   Acute respiratory failure with hypoxia (HCC)   Paroxysmal atrial fibrillation with RVR (HCC)   Ataxia   Non-small cell carcinoma of lung, stage 4, left (HCC)   Pleural effusion, malignant   Hyperlipemia   CAD (coronary artery disease), native coronary artery   OSA (obstructive sleep apnea)   Chronic systolic CHF (congestive heart failure) ([place]   Hypokalemia   Fall at home, initial encounter   Hypothyroidism    Pulmonary embolism (HCC)/left lower extremity acute DVT - Suspected provoked in setting of recent airline travel and also underlying malignancy.  Known history of prior PE as well - Confirmed with patient and spouse at bedside that he was compliant with Eliquis.  Hence failed Eliquis. -Has been on IV heparin infusion since admission 3/4, completing approximately 72 hours this afternoon.  He has been hemodynamically stable and no longer hypoxic at rest.  He could be switched to Lovenox today.  However, Dr. EAntonieta Pertfollow-up note this morning indicated to continue IV heparin infusion for another day (really unclear indications) and transition to Lovenox on 3/8.  Defer anticoagulation management to Dr. EMarin Olp   - appreciate pulmonology consult; no need for lytics at this time unless were to be hemodynamically unstable  -TTE: LVEF 50-55%, , Grade 1 diastolic dysfunction.  RV systolic function mildly reduced.  Normal pulmonary artery systolic pressure.   Hypotension - Possibly due to volume depletion with underlying AKI however may have also had some contribution from clot burden due to PE -  He has been on prednisone outpatient for ataxia in setting of immunotherapy -Due to significant hypotension on admission, he was started on stress dose steroids with Solu-Cortef.   -Blood pressures have normalized.  Weaned down hydrocortisone over the last 24 hours.  Will switch to oral prednisone home dose 10 Mg daily tomorrow.   Paroxysmal  atrial fibrillation with RVR (HCC) - no known prior history; suspect precipitated from acute stress/illness of PE - s/p diltiazem in ER but BP did not tolerate - on heparin now for anticoagulation.  Eventually to transition to Lovenox - has converted back to NSR and remains in sinus rhythm. - rate currently controlled; hold off on further treatment unless coverts back to afib -TTE results as noted above. Continue to hold Toprol-XL due to SB in the 50s-SR.   Acute respiratory failure with hypoxia (HCC) - Due to underlying PE and also underlying lung cancer with left pleural effusion - Saturating in the high 90s on room air today. -As per detail his 3 by patient's spouse, for approximately a week up in [place], patient had oxygen saturations in the low 80s when checked by their home pulse oximetry but did not have oxygen support. -Need to do home O2 evaluation prior to discharge.  Altered mental status Unclear etiology.  Extensively evaluated as noted above.  Repeat MRI brain 3/6 negative.  As per spouse at bedside, mental status has significantly improved compared to admission but still not back to baseline and remains somewhat confused.  Prolonged hypoxia during their [place] trip could have contributed.  Anemia: Appears to have baseline hemoglobin in the 11-12 range.  This is gradually drifted down to mid 9 g range, likely related to blood draws, heparin etc..  Hemoglobin table in the 9 g range.  Thrombocytopenia Appears chronic and stable.  Monitor.   Ataxia - appears has had extensive workup outpatient recently with paraneoplastic workup and LP which has been negative - MRI brain has also been performed on 08/11/2022 which was also negative for acute abnormalities nor any metastatic disease.  Repeat MRI brain 3/6: Negative for acute findings or metastatic disease. - Cognition and ataxia felt to possibly be related to his immunotherapy -Therapies recommend CIR.  Rehab admission  coordinator on board.  Discussed with PT team regarding more frequent therapies evaluation in the hospital.  Due to staffing issues, they unfortunately will be able to get to him maybe 3 times a week but can alternate with OT to be seen daily.   Pleural effusion, malignant - noted -Unchanged small loculated left pleural effusion with significant irregular pleural thickening at posterior costophrenic angle noted on CTA chest   Non-small cell carcinoma of lung, stage 4, left (HCC) - follows with Dr. Marin Olp - Stage IVA (T3N2N0) adenocarcinoma of the LUL -- resected -- (+) KRAS -- progression - metastasis to right lung, bilateral adrenal glands, bony metastasis to T1, Y 10 and left iliac bone  - continue following with oncology at discharge  -Dr. Antonieta Pert input appreciated.  He notes that patient may have to be switched to regular chemotherapy.   AKI (acute kidney injury) (HCC)-resolved as of 09/22/2022 - baseline creatinine ~ 1.1 - patient presents with increase in creat >0.3 mg/dL above baseline, creat increase >1.5x baseline presumed to have occurred within past 7 days PTA -1.5 on admission, suspected prerenal - Fluid resuscitated in setting of hypotension as well -Monitor BMP periodically.  Resolved.   Hypothyroidism - TSH normal, 1.238 - Continue Synthroid   Fall at home,  initial encounter - Due to ongoing ataxia and weakness/deconditioning - PT/OT following and recommend CIR.   Hypokalemia -Replaced.   Chronic systolic CHF (congestive heart failure) (HCC) - No signs or symptoms of exacerbation - Toprol and Entresto on hold in setting of hypotension.  Could restart Entresto at discharge when reassess regarding Toprol given some sinus bradycardia in the 50s   OSA (obstructive sleep apnea) - Continue nightly CPAP   CAD (coronary artery disease), native coronary artery - Continue aspirin, Crestor -Entresto and Toprol on hold in setting of hypotension.   Hyperlipemia - Continue  Crestor   Body mass index is 25.46 kg/m.    DVT prophylaxis:   Currently on IV heparin drip.   Code Status: DNR:  ACP Documents: None present Family Communication: Spouse at bedside Disposition:  Status is: Inpatient Remains inpatient appropriate because: Remains on IV heparin.  Hopefully can transition to Lovenox tomorrow 3/8 and will then be medically ready for DC to next level of care, CIR if accepted versus others.     Consultants:   Medical oncology PCCM  Procedures:     Antimicrobials:      Subjective:  Met with patient and spouse at bedside.  No specific complaints.  Just concerned that PT has not come by since yesterday.  Reports being more steady on his feet with OT this morning.  Objective:   Vitals:   09/23/22 1945 09/23/22 2335 09/24/22 0348 09/24/22 0757  BP: 122/73 137/81 131/78 (!) 138/94  Pulse: 61 60 (!) 56 (!) 58  Resp: '20 15 16 16  '$ Temp: 98.1 F (36.7 C) 98.3 F (36.8 C) 98.1 F (36.7 C) 98 F (36.7 C)  TempSrc: Oral Oral Oral Oral  SpO2: 97% 97% 97% 94%  Weight:      Height:        General exam: Elderly male, moderately built and nourished lying comfortably propped up in bed without distress. Respiratory system: Slightly diminished breath sounds in the bases but otherwise clear to auscultation.  No increased work of breathing.  Midline sternotomy scar.  Stable without changes Cardiovascular system: S1 & S2 heard, RRR. No JVD, murmurs, rubs, gallops or clicks. No pedal edema.  Telemetry personally reviewed: SB in the 50s-SR. Gastrointestinal system: Abdomen is nondistended, soft and nontender. No organomegaly or masses felt. Normal bowel sounds heard. Central nervous system: Alert and oriented. No focal neurological deficits. Extremities: Symmetric 5 x 5 power. Skin: No rashes, lesions or ulcers Psychiatry: Judgement and insight appears somewhat impaired. Mood & affect mostly flat.     Data Reviewed:   I have personally reviewed  following labs and imaging studies   CBC: Recent Labs  Lab 09/21/22 1348 09/21/22 1402 09/22/22 0747 09/23/22 0125 09/24/22 0133  WBC 5.4  --  5.4 6.2 5.4  NEUTROABS 4.8  --   --   --   --   HGB 11.6*   < > 10.6* 9.7* 9.4*  HCT 36.4*   < > 30.8* 27.8* 27.2*  MCV 102.0*  --  97.5 96.9 96.8  PLT 113*  --  89* 88* 85*   < > = values in this interval not displayed.    Basic Metabolic Panel: Recent Labs  Lab 09/21/22 1402 09/21/22 1457 09/21/22 1940 09/22/22 0747 09/23/22 0125 09/24/22 0133  NA 138 139  --  139 139 140  K 6.1* 3.4*  --  3.7 4.1 3.8  CL  --  105  --  111 110 108  CO2  --  24  --  20* 21* 21*  GLUCOSE  --  122*  --  132* 153* 142*  BUN  --  27*  --  25* 29* 24*  CREATININE 1.50* 1.43*  --  1.09 1.23 1.02  CALCIUM  --  8.1*  --  7.8* 7.9* 7.9*  MG  --   --  2.5* 2.6* 2.6* 2.4  PHOS  --   --  2.7 2.7  --   --     Liver Function Tests: Recent Labs  Lab 09/21/22 1946 09/22/22 0747 09/24/22 0133  AST '26 16 16  '$ ALT '15 13 18  '$ ALKPHOS 43 40 43  BILITOT 1.3* 1.0 0.3  PROT 5.6* 5.4* 5.1*  ALBUMIN 3.1* 3.0* 2.8*    CBG: No results for input(s): "GLUCAP" in the last 168 hours.  Microbiology Studies:   Recent Results (from the past 240 hour(s))  Resp panel by RT-PCR (RSV, Flu A&B, Covid) Anterior Nasal Swab     Status: None   Collection Time: 09/21/22  1:36 PM   Specimen: Anterior Nasal Swab  Result Value Ref Range Status   SARS Coronavirus 2 by RT PCR NEGATIVE NEGATIVE Final   Influenza A by PCR NEGATIVE NEGATIVE Final   Influenza B by PCR NEGATIVE NEGATIVE Final    Comment: (NOTE) The Xpert Xpress SARS-CoV-2/FLU/RSV plus assay is intended as an aid in the diagnosis of influenza from Nasopharyngeal swab specimens and should not be used as a sole basis for treatment. Nasal washings and aspirates are unacceptable for Xpert Xpress SARS-CoV-2/FLU/RSV testing.  Fact Sheet for Patients: EntrepreneurPulse.com.au  Fact Sheet for  Healthcare Providers: IncredibleEmployment.be  This test is not yet approved or cleared by the [place] FDA and has been authorized for detection and/or diagnosis of SARS-CoV-2 by FDA under an Emergency Use Authorization (EUA). This EUA will remain in effect (meaning this test can be used) for the duration of the COVID-19 declaration under Section 564(b)(1) of the Act, 21 U.S.C. section 360bbb-3(b)(1), unless the authorization is terminated or revoked.     Resp Syncytial Virus by PCR NEGATIVE NEGATIVE Final    Comment: (NOTE) Fact Sheet for Patients: EntrepreneurPulse.com.au  Fact Sheet for Healthcare Providers: IncredibleEmployment.be  This test is not yet approved or cleared by the [place] FDA and has been authorized for detection and/or diagnosis of SARS-CoV-2 by FDA under an Emergency Use Authorization (EUA). This EUA will remain in effect (meaning this test can be used) for the duration of the COVID-19 declaration under Section 564(b)(1) of the Act, 21 U.S.C. section 360bbb-3(b)(1), unless the authorization is terminated or revoked.  Performed at [facility], [place] [address], [place], [place] [address]   MRSA Next Gen by PCR, Nasal     Status: None   Collection Time: 09/22/22  4:26 AM   Specimen: Urine, Clean Catch; Nasal Swab  Result Value Ref Range Status   MRSA by PCR Next Gen NOT DETECTED NOT DETECTED Final    Comment: (NOTE) The GeneXpert MRSA Assay (FDA approved for NASAL specimens only), is one component of a comprehensive MRSA colonization surveillance program. It is not intended to diagnose MRSA infection nor to guide or monitor treatment for MRSA infections. Test performance is not FDA approved in patients less than 67 years old. Performed at [facility], [place] [address] [place], [place] [address]     Radiology Studies:  MR BRAIN W WO CONTRAST  Result Date: 09/23/2022 CLINICAL  DATA:  Confusion EXAM: MRI HEAD  WITHOUT AND WITH CONTRAST TECHNIQUE: Multiplanar, multiecho pulse sequences of the brain and surrounding structures were obtained without and with intravenous contrast. CONTRAST:  7.18m GADAVIST GADOBUTROL 1 MMOL/ML IV SOLN COMPARISON:  MRI Head 08/11/2022 FINDINGS: Brain: Negative for an acute infarct. No hemorrhage. No hydrocephalus. No extra-axial fluid collection. No hemorrhage. Likely left frontal DVA. No evidence of a parenchymal contrast-enhancing lesion. No evidence of leptomeningeal disease. Vascular: Normal flow voids Skull and upper cervical spine: Normal marrow signal. Sinuses/Orbits: Mild mucosal thickening in the posterior ethmoid air cells on the left of the floor of bilateral maxillary sinuses. No mastoid or middle ear effusion. Orbits are unremarkable. Other: None. IMPRESSION: No acute intracranial abnormality. No evidence of intracranial metastatic disease. Electronically Signed   By: HMarin RobertsM.D.   On: 09/23/2022 13:22    Scheduled Meds:    aspirin  81 mg Oral QHS   hydrocortisone sod succinate (SOLU-CORTEF) inj  75 mg Intravenous Q12H   levothyroxine  50 mcg Oral QAC breakfast   pantoprazole  40 mg Oral Daily   rosuvastatin  40 mg Oral QHS   sertraline  100 mg Oral BID    Continuous Infusions:    heparin 1,250 Units/hr (09/24/22 1108)     LOS: 2 days     AVernell Leep MD,  FACP, FHM, SFHM, [facility] CIsmay    To contact the attending provider between 7A-7P or the covering provider during after hours 7P-7A, please log into the web site [email or web address] and access using universal [place] password for that web site. If you do not have the password, please call the hospital operator.  09/24/2022, 12:30 PM

## 2022-09-24 NOTE — Progress Notes (Signed)
ANTICOAGULATION CONSULT NOTE - Follow-Up Consult  Pharmacy Consult for heparin infusion Indication: atrial fibrillation and acute pulmonary embolus  Allergies  Allergen Reactions   Amlodipine Swelling    Swelling of ankles    Patient Measurements: Height: '5\' 11"'$  (180.3 cm) Weight: 82.8 kg (182 lb 8.7 oz) IBW/kg (Calculated) : 75.3 Heparin Dosing Weight: 82.8 kg  Vital Signs: Temp: 98 F (36.7 C) (03/07 0757) Temp Source: Oral (03/07 0757) BP: 138/94 (03/07 0757) Pulse Rate: 58 (03/07 0757)  Labs: Recent Labs    09/21/22 1348 09/21/22 1751 09/21/22 1940 09/21/22 1946 09/21/22 2128 09/22/22 0747 09/22/22 1821 09/23/22 0125 09/24/22 0133  HGB  --   --   --   --   --  10.6*  --  9.7* 9.4*  HCT  --   --   --   --   --  30.8*  --  27.8* 27.2*  PLT  --   --   --   --   --  89*  --  88* 85*  APTT   < >  --  44*  --   --  186* 75* 89* 52*  LABPROT  --   --  20.4*  --   --   --   --   --   --   INR  --   --  1.8*  --   --   --   --   --   --   HEPARINUNFRC   < >  --  >1.10*  --   --  >1.10*  --  >1.10* 0.72*  CREATININE  --   --   --   --   --  1.09  --  1.23 1.02  CKTOTAL  --   --  54  --   --   --   --   --   --   TROPONINIHS  --  94*  --  83* 83*  --   --   --   --    < > = values in this interval not displayed.    Estimated Creatinine Clearance: 67.7 mL/min (by C-G formula based on SCr of 1.02 mg/dL).   Medical History: Past Medical History:  Diagnosis Date   BPH (benign prostatic hypertrophy)    Bradycardia    a. H/o asymptomatic bradycardia.   Coronary artery disease    a. s/p CABG ~2007 with LIMA to LAD, SVG seq to OM1 and OM2, SVG to PDA and SVG to diag. b. Abnormal nuc 03/2015 - s/p DES to SVG-OM1-OM2. 01/29/17 POBA with cutting balloon to SVG-->OM.  No ischemia Lexi myoview 2021   DCM (dilated cardiomyopathy) ([place]) 09/17/2015   mixed by cMRI 03/2021 with EF 39% with non viable infarct in basal to mid lateral wall and LGE pattern c/w nonischemic component as  well. Mild RV dysfunction RVEF 42%.   Dyslipidemia    Dyspnea    r/t lung mass   ED (erectile dysfunction)    Fear of heights    Goals of care, counseling/discussion 06/19/2020   History of radiation therapy    T spine and left ilium 10/07/2021-10/20/2021  Dr Gery Pray   Hypertension    Non-small cell carcinoma of lung, stage 4, left ([employer]) 06/19/2020   OSA (obstructive sleep apnea) 07/12/2015   Moderate OSA with AHI 16.5/hr now on CPAP at 8cm H2O, uses cpap nightly      RBBB    Shoulder, capsulitis, adhesive    Left Shoulder  Medications:  Medications Prior to Admission  Medication Sig Dispense Refill Last Dose   aspirin 81 MG tablet Take 81 mg by mouth at bedtime.    09/21/2022   colchicine 0.6 MG tablet Take 1 tablet (0.6 mg total) by mouth every 8 (eight) hours as needed. 60 tablet 1 unk   Cyanocobalamin (B-12 PO) Take 1,000 mcg by mouth daily.    Past Week   diphenoxylate-atropine (LOMOTIL) 2.5-0.025 MG tablet TAKE 2 TABLETS BY MOUTH AT ONSET OF DIARRHEA THEN TAKE 1 TABLET AFTER EACH LOOSE STOOL MAX 8 TABLETS PER DAY (Patient taking differently: Take 2 tablets by mouth 4 (four) times daily as needed for diarrhea or loose stools. TAKE 2 TABLETS BY MOUTH AT ONSET OF DIARRHEA THEN TAKE 1 TABLET AFTER EACH LOOSE STOOL MAX 8 TABLETS PER DAY) 100 tablet 0 unk   ELIQUIS 5 MG TABS tablet TAKE 1 TABLET BY MOUTH TWICE A DAY 60 tablet 3 09/21/2022 at am   ezetimibe (ZETIA) 10 MG tablet Take 1 tablet (10 mg total) by mouth daily. 90 tablet 3 0000000   folic acid (FOLVITE) 1 MG tablet TAKE ONE TABLET BY MOUTH DAILY START 5 TO 7 DAYS PRIOR TO CHEMO AND CONTINUE UNTIL 21 DAYS AFTER COMPLETES 90 tablet 3 Past Week   levothyroxine (SYNTHROID) 50 MCG tablet Take 1 tablet (50 mcg total) by mouth daily before breakfast. 30 tablet 3 09/21/2022   metoprolol succinate (TOPROL-XL) 25 MG 24 hr tablet TAKE ONE TABLET BY MOUTH EVERY NIGHT AT BEDTIME 90 tablet 0 Past Week   Multiple Vitamin (MULTIVITAMIN) capsule  Take 1 capsule by mouth daily.   09/21/2022   nitroGLYCERIN (NITROSTAT) 0.4 MG SL tablet Place 1 tablet (0.4 mg total) under the tongue every 5 (five) minutes as needed for chest pain. 25 tablet 3 unk   pantoprazole (PROTONIX) 40 MG tablet Take 1 tablet (40 mg total) by mouth daily. 30 tablet 3 09/21/2022   predniSONE (DELTASONE) 10 MG tablet Take 1 tablet (10 mg total) by mouth daily with breakfast. 30 tablet 4 09/21/2022   rosuvastatin (CRESTOR) 40 MG tablet TAKE ONE TABLET BY MOUTH EVERY NIGHT AT BEDTIME (Patient taking differently: Take 40 mg by mouth daily.) 90 tablet 1 Past Week   sacubitril-valsartan (ENTRESTO) 24-26 MG TAKE ONE TABLET BY MOUTH TWICE A DAY 60 tablet 9 Past Week   sertraline (ZOLOFT) 100 MG tablet Take 100 mg by mouth 2 (two) times daily.   Past Week   traMADol (ULTRAM) 50 MG tablet Take 1 tablet (50 mg total) by mouth every 6 (six) hours as needed. 90 tablet 0 unk   oxyCODONE-acetaminophen (PERCOCET) 7.5-325 MG tablet Take 1 tablet by mouth every 4 (four) hours as needed for severe pain. 60 tablet 0    predniSONE (DELTASONE) 20 MG tablet Take 3 tablets (60 mg total) by mouth daily with breakfast for 5 days, THEN 2 tablets (40 mg total) daily with breakfast for 25 days. Continue 40 mg dose until discontinued by Physician.. (Patient not taking: Reported on 09/22/2022) 65 tablet 0 Not Taking   sucralfate (CARAFATE) 1 g tablet Take 1 tablet (1 g total) by mouth 4 (four) times daily. Dissolve each tablet in 15 cc water before use. (Patient not taking: Reported on 09/22/2022) 120 tablet 2 Completed Course   Scheduled:   aspirin  81 mg Oral QHS   hydrocortisone sod succinate (SOLU-CORTEF) inj  75 mg Intravenous Q12H   levothyroxine  50 mcg Oral QAC breakfast   pantoprazole  40  mg Oral Daily   rosuvastatin  40 mg Oral QHS   sertraline  100 mg Oral BID   Infusions:   heparin 1,100 Units/hr (09/23/22 1629)    Assessment: Pt with a hx of PE in 10/22 presented with a new PE. He has been on  apixaban '5mg'$  PO BID for AF, concerning for apixaban failure. Last dose of apixaban was 09/21/22 AM. He has a hx of stage 4 lung CA. Dopplers positive for LLE DVT and RLE superficial thrombus. Pharmacy consulted to dose and manage heparin infusion for treatment of acute PE and Afib.  aPTT down at 52, sub-therapeutic Heparin level trending down at 0.72 (still not correlating with aPTT in setting of recent apixaban administration) Current heparin infusion rate: 1100 units/hr  Hgb trending down to 9.4.  Plt 85, low-stable.  No s/sx of bleeding reported by RN.   Goal of Therapy:  Heparin level 0.3-0.7 units/ml aPTT: 66-102 Monitor platelets by anticoagulation protocol: Yes   Plan:  Increase heparin infusion to 1250 units/hr. Recheck aPTT in 8 hours.  Monitor daily CBC, aPTT (until heparin levels are correlating), heparin level, and for s/sx of bleeding Per oncology recommendations, plan to transition to enoxaparin on 09/25/22 per Dr. Antonieta Pert note today (Recommend enoxaparin '80mg'$  [place] q12h)  Sloan Leiter, PharmD, BCPS, BCCCP Clinical Pharmacist Please refer to [facility] for [place] numbers 09/24/2022 9:49 AM

## 2022-09-25 ENCOUNTER — Other Ambulatory Visit (HOSPITAL_COMMUNITY): Payer: Self-pay

## 2022-09-25 ENCOUNTER — Encounter: Payer: Self-pay | Admitting: Hematology & Oncology

## 2022-09-25 DIAGNOSIS — I2609 Other pulmonary embolism with acute cor pulmonale: Secondary | ICD-10-CM | POA: Diagnosis not present

## 2022-09-25 LAB — COMPREHENSIVE METABOLIC PANEL
ALT: 21 U/L (ref 0–44)
AST: 20 U/L (ref 15–41)
Albumin: 2.9 g/dL — ABNORMAL LOW (ref 3.5–5.0)
Alkaline Phosphatase: 41 U/L (ref 38–126)
Anion gap: 7 (ref 5–15)
BUN: 22 mg/dL (ref 8–23)
CO2: 23 mmol/L (ref 22–32)
Calcium: 7.9 mg/dL — ABNORMAL LOW (ref 8.9–10.3)
Chloride: 111 mmol/L (ref 98–111)
Creatinine, Ser: 1.07 mg/dL (ref 0.61–1.24)
GFR, Estimated: >60 mL/min (ref >60)
Glucose, Bld: 132 mg/dL — ABNORMAL HIGH (ref 70–99)
Potassium: 3.8 mmol/L (ref 3.5–5.1)
Sodium: 141 mmol/L (ref 135–145)
Total Bilirubin: 0.6 mg/dL (ref 0.3–1.2)
Total Protein: 5.2 g/dL — ABNORMAL LOW (ref 6.5–8.1)

## 2022-09-25 LAB — CBC
HCT: 27.5 % — ABNORMAL LOW (ref 39.0–52.0)
Hemoglobin: 9.5 g/dL — ABNORMAL LOW (ref 13.0–17.0)
MCH: 33.5 pg (ref 26.0–34.0)
MCHC: 34.5 g/dL (ref 30.0–36.0)
MCV: 96.8 fL (ref 80.0–100.0)
Platelets: 78 10*3/uL — ABNORMAL LOW (ref 150–400)
RBC: 2.84 MIL/uL — ABNORMAL LOW (ref 4.22–5.81)
RDW: 14.3 % (ref 11.5–15.5)
WBC: 5.1 10*3/uL (ref 4.0–10.5)
nRBC: 0 % (ref 0.0–0.2)

## 2022-09-25 LAB — APTT: aPTT: 81 seconds — ABNORMAL HIGH (ref 24–36)

## 2022-09-25 LAB — MAGNESIUM: Magnesium: 2.5 mg/dL — ABNORMAL HIGH (ref 1.7–2.4)

## 2022-09-25 LAB — HEPARIN LEVEL (UNFRACTIONATED): Heparin Unfractionated: 0.73 IU/mL — ABNORMAL HIGH (ref 0.30–0.70)

## 2022-09-25 MED ORDER — ENOXAPARIN SODIUM 80 MG/0.8ML IJ SOSY: 80.0000 mg | PREFILLED_SYRINGE | Freq: Two times a day (BID) | INTRAMUSCULAR | 0 refills | Status: DC

## 2022-09-25 MED ORDER — ENOXAPARIN SODIUM 80 MG/0.8ML IJ SOSY
80.0000 mg | PREFILLED_SYRINGE | Freq: Two times a day (BID) | INTRAMUSCULAR | 0 refills | Status: DC
  Filled 2022-09-25: qty 11.2, 7d supply, fill #0
  Filled 2022-09-25: qty 24, 30d supply, fill #0
  Filled 2022-09-25 (×2): qty 11.2, 7d supply, fill #0

## 2022-09-25 MED ORDER — ENOXAPARIN SODIUM 80 MG/0.8ML IJ SOSY
80.0000 mg | PREFILLED_SYRINGE | Freq: Two times a day (BID) | INTRAMUSCULAR | Status: DC
  Administered 2022-09-25: 80 mg via SUBCUTANEOUS
  Filled 2022-09-25: qty 0.8

## 2022-09-25 NOTE — Discharge Summary (Addendum)
Physician Discharge Summary  John Hinton [id number] DOB: [birth year]  PCP: Corliss Blacker, MD  Admitted from: Home Discharged to: Home  Admit date: 09/21/2022 Discharge date: 09/25/2022  Recommendations for Outpatient Follow-up:    Follow-up [facility] Follow up.   Specialty: Rehabilitation Why: New referral sent for outpt PT/OT Contact information: [address] [place]        Corliss Blacker, MD. Schedule an appointment as soon as possible for a visit in 1 week(s).   Specialty: Internal Medicine Why: To be seen with repeat labs (CBC & BMP).  Will need refill for his therapeutic dose Lovenox prescription. Contact information: [address] [place] [address] [phone]                  Home Health: Outpatient PT and OT.  TOC has arranged.    Equipment/Devices:     Museum/gallery conservator  (From admission, onward)           Start     Ordered   09/25/22 1234  For home use only DME 3 n 1  Once        09/25/22 1237             Discharge Condition: Improved and stable.   Code Status: DNR ACP Documents: None present. Diet recommendation:  Discharge Diet Orders (From admission, onward)     Start     Ordered   09/25/22 0000  Diet - low sodium heart healthy        09/25/22 1303             Discharge Diagnoses:  Principal Problem:   Acute pulmonary embolism ([place]) Active Problems:   DVT (deep venous thrombosis) (HCC)   Acute respiratory failure with hypoxia (HCC)   Paroxysmal atrial fibrillation with RVR (HCC)   Ataxia   Non-small cell carcinoma of lung, stage 4, left (HCC)   Pleural effusion, malignant   Hyperlipemia   CAD (coronary artery disease), native coronary artery   OSA (obstructive sleep apnea)   Chronic systolic CHF (congestive heart failure) ([place])   Hypokalemia   Fall at home, initial  encounter   Hypothyroidism   Brief Summary: John Hinton is a 77 yo married male with PMH Stage IV adenocarcinoma LUL s/p resection (mets to right lung, B/L adrenals, lumbar and iliac bones), follows with Dr. Marin Olp and has been on Xgeva q3month and ongoing Eliquis for history of PE. He has had some cognitive impairment and has been evaluated by Neurology recently and also undergone paraneoplastic workup and infectious workup with LP. This appears negative and that his cognition may be due to immunotherapy per oncology. Due to increase in unsteadiness he underwent recent MRI brain on 08/11/2022.  This was negative for acute abnormalities nor any evidence of metastatic disease. For this hospitalization, he presented with worsening shortness of breath for approximately 2 weeks.  [place] was feeling short of breath which did not seem to improve throughout the trip and then worsened again upon returning back home.  As per spouse's report, for almost a week, patient had oxygen saturations in the low 75s with little to no oxygen support. CTA chest confirmed acute occlusive lobar and segmental PE in the right middle and lower lobes.  He was also noted to be hypotensive, in A-fib with RVR, and  hypoxic on admission. Pulmonology was consulted.  He was not felt to immediately warrant lytic therapy but if were to become hemodynamically unstable then could be further considered with understanding of increased bleeding risk. He was started on heparin drip which was transitioned to full dose Lovenox, due to failed Eliquis treatment.       Assessment & Plan:      Pulmonary embolism (HCC)/left lower extremity acute DVT/b/l LE SVT's - Suspected provoked in setting of recent airline travel and also underlying malignancy.  Known history of prior PE as well - Confirmed with patient and spouse at bedside that he was compliant with Eliquis.  Hence failed  Eliquis. -Has been on IV heparin infusion since admission 3/4.  Completed more than 72 hours of same.  Anticoagulation managed by his primary oncologist Dr. Marin Olp.  As per his guidance, patient was transitioned from IV heparin to Lovenox on day of discharge and got his first dose early this morning.  Between [facility] and floor pharmacy, arrangements made for patient to be able to get his Lovenox at time of discharge.  Patient/family has been educated regarding self administration of Lovenox. - appreciate pulmonology consult; no need for lytics at this time unless were to be hemodynamically unstable  -TTE: LVEF 50-55%, , Grade 1 diastolic dysfunction.  RV systolic function mildly reduced.  Normal pulmonary artery systolic pressure.   Hypotension - Possibly due to volume depletion with underlying AKI however may have also had some contribution from clot burden due to PE - He has been on prednisone outpatient for ataxia in setting of immunotherapy -Due to significant hypotension on admission, he was started on stress dose steroids with Solu-Cortef.   -Blood pressures have normalized.  Weaned down hydrocortisone over the last 24 hours.  Transitioned to home dose of prednisone 10 Mg daily on day of discharge.   Paroxysmal atrial fibrillation with RVR (HCC) - no known prior history; suspect precipitated from acute stress/illness of PE - s/p diltiazem in ER but BP did not tolerate -Continue therapy with dose Lovenox anticoagulation. - has converted back to NSR and remains in sinus bradycardia/sinus rhythm. - rate currently controlled; hold off on further treatment unless coverts back to afib -TTE results as noted above.  Due to sustained sinus bradycardia in the 55-sinus rhythm in the 60s with very occasional sinus bradycardia up to 39, discontinued Toprol-XL at time of discharge to avoid problematic bradycardia.  Close follow-up with PCP/cardiology to determine if this can be reinitiated safely.   Acute  respiratory failure with hypoxia (HCC) - Due to underlying PE and also underlying lung cancer with left pleural effusion -As per detail his 3 by patient's spouse, for approximately a week up in [place], patient had oxygen saturations in the low 75s when checked by their home pulse oximetry but did not have oxygen support. -Resolved and saturating in the mid 90s to 100% on room air even with activity.   Altered mental status Unclear etiology.  Extensively evaluated as noted above.  Repeat MRI brain 3/6 negative.  As per spouse at bedside, mental status has significantly improved compared to admission but still not back to baseline and remains somewhat confused.  Prolonged hypoxia during their [place] trip could have contributed.   Anemia: Appears to have baseline hemoglobin in the 11-12 range.  This is gradually drifted down to mid 9 g range, likely related to blood draws, heparin etc..  Hemoglobin table in the 9 g range.   Thrombocytopenia Appears chronic and stable.  Monitor.  Close outpatient follow-up with oncology and has a upcoming appointment.   Ataxia - appears has had extensive workup outpatient recently with paraneoplastic workup and LP which has been negative - MRI brain has also been performed on 08/11/2022 which was also negative for acute abnormalities nor any metastatic disease.  Repeat MRI brain 3/6: Negative for acute findings or metastatic disease. - Cognition and ataxia felt to possibly be related to his immunotherapy -Initially therapies were commended CIR but patient has progressively done well.  Today he ambulated 400 feet with PT.  They recommend outpatient PT.  Patient and family agreeable.   Pleural effusion, malignant - noted -Unchanged small loculated left pleural effusion with significant irregular pleural thickening at posterior costophrenic angle noted on CTA chest   Non-small cell carcinoma of lung, stage 4, left (HCC) - follows with Dr. Marin Olp - Stage IVA  (T3N2N0) adenocarcinoma of the LUL -- resected -- (+) KRAS -- progression - metastasis to right lung, bilateral adrenal glands, bony metastasis to T1, Y 10 and left iliac bone  - continue following with oncology at discharge  -Dr. Antonieta Pert input appreciated.  He notes that patient may have to be switched to regular chemotherapy.  However now it appears that patient does not wish to pursue further chemotherapy.  Outpatient follow-up with oncology.   AKI (acute kidney injury) (HCC)-resolved as of 09/22/2022 - baseline creatinine ~ 1.1 - patient presents with increase in creat >0.3 mg/dL above baseline, creat increase >1.5x baseline presumed to have occurred within past 7 days PTA -1.5 on admission, suspected prerenal - Fluid resuscitated in setting of hypotension as well -Monitor BMP periodically.  Resolved.   Hypothyroidism - TSH normal, 1.238 - Continue Synthroid   Fall at home, initial encounter - Due to ongoing ataxia and weakness/deconditioning - Outpatient PT   Hypokalemia -Replaced.   Chronic systolic CHF (congestive heart failure) (HCC) - No signs or symptoms of exacerbation - Toprol-XL discontinued due to concerns for bradycardia.  Resumed Entresto at discharge.  Follow BMP as outpatient.  As per TTE above, his LVEF has now normalized.   OSA (obstructive sleep apnea) - Continue nightly CPAP   CAD (coronary artery disease), native coronary artery - Continue aspirin, Crestor -Entresto and Toprol on hold in setting of hypotension.   Hyperlipemia - Continue Crestor   Body mass index is 25.46 kg/m.     Consultants:   Medical oncology PCCM   Procedures:       Discharge Instructions  Discharge Instructions     (HEART FAILURE PATIENTS) Call MD:  Anytime you have any of the following symptoms: 1) 3 pound weight gain in 24 hours or 5 pounds in 1 week 2) shortness of breath, with or without a dry hacking cough 3) swelling in the hands, feet or stomach 4) if you have to  sleep on extra pillows at night in order to breathe.   Complete by: As directed    Call MD for:  difficulty breathing, headache or visual disturbances   Complete by: As directed    Call MD for:  extreme fatigue   Complete by: As directed    Call MD for:  persistant dizziness or light-headedness   Complete by: As directed    Call MD for:  persistant nausea and vomiting   Complete by: As directed    Call MD for:  severe uncontrolled pain   Complete by: As directed    Call MD for:  temperature >100.4   Complete by: As  directed    Diet - low sodium heart healthy   Complete by: As directed    Increase activity slowly   Complete by: As directed         Medication List     STOP taking these medications    Eliquis 5 MG Tabs tablet Generic drug: apixaban   metoprolol succinate 25 MG 24 hr tablet Commonly known as: TOPROL-XL   oxyCODONE-acetaminophen 7.5-325 MG tablet Commonly known as: Percocet       TAKE these medications    aspirin 81 MG tablet Take 81 mg by mouth at bedtime.   B-12 PO Take 1,000 mcg by mouth daily.   colchicine 0.6 MG tablet Take 1 tablet (0.6 mg total) by mouth every 8 (eight) hours as needed.   diphenoxylate-atropine 2.5-0.025 MG tablet Commonly known as: LOMOTIL TAKE 2 TABLETS BY MOUTH AT ONSET OF DIARRHEA THEN TAKE 1 TABLET AFTER EACH LOOSE STOOL MAX 8 TABLETS PER DAY What changed:  how much to take how to take this when to take this reasons to take this   enoxaparin 80 MG/0.8ML injection Commonly known as: LOVENOX Inject 0.8 mLs (80 mg total) into the skin 2 (two) times daily.   Entresto 24-26 MG Generic drug: sacubitril-valsartan TAKE ONE TABLET BY MOUTH TWICE A DAY   ezetimibe 10 MG tablet Commonly known as: ZETIA Take 1 tablet (10 mg total) by mouth daily.   folic acid 1 MG tablet Commonly known as: FOLVITE TAKE ONE TABLET BY MOUTH DAILY START 5 TO 7 DAYS PRIOR TO CHEMO AND CONTINUE UNTIL 21 DAYS AFTER COMPLETES    levothyroxine 50 MCG tablet Commonly known as: Synthroid Take 1 tablet (50 mcg total) by mouth daily before breakfast.   multivitamin capsule Take 1 capsule by mouth daily.   nitroGLYCERIN 0.4 MG SL tablet Commonly known as: NITROSTAT Place 1 tablet (0.4 mg total) under the tongue every 5 (five) minutes as needed for chest pain.   pantoprazole 40 MG tablet Commonly known as: PROTONIX Take 1 tablet (40 mg total) by mouth daily.   predniSONE 10 MG tablet Commonly known as: DELTASONE Take 1 tablet (10 mg total) by mouth daily with breakfast.   rosuvastatin 40 MG tablet Commonly known as: CRESTOR TAKE ONE TABLET BY MOUTH EVERY NIGHT AT BEDTIME What changed: when to take this   sertraline 100 MG tablet Commonly known as: ZOLOFT Take 100 mg by mouth 2 (two) times daily.   traMADol 50 MG tablet Commonly known as: ULTRAM Take 1 tablet (50 mg total) by mouth every 6 (six) hours as needed.       Allergies  Allergen Reactions   Amlodipine Swelling    Swelling of ankles      Procedures/Studies: MR BRAIN W WO CONTRAST  Result Date: 09/23/2022 CLINICAL DATA:  Confusion EXAM: MRI HEAD WITHOUT AND WITH CONTRAST TECHNIQUE: Multiplanar, multiecho pulse sequences of the brain and surrounding structures were obtained without and with intravenous contrast. CONTRAST:  7.92m GADAVIST GADOBUTROL 1 MMOL/ML IV SOLN COMPARISON:  MRI Head 08/11/2022 FINDINGS: Brain: Negative for an acute infarct. No hemorrhage. No hydrocephalus. No extra-axial fluid collection. No hemorrhage. Likely left frontal DVA. No evidence of a parenchymal contrast-enhancing lesion. No evidence of leptomeningeal disease. Vascular: Normal flow voids Skull and upper cervical spine: Normal marrow signal. Sinuses/Orbits: Mild mucosal thickening in the posterior ethmoid air cells on the left of the floor of bilateral maxillary sinuses. No mastoid or middle ear effusion. Orbits are unremarkable. Other: None. IMPRESSION: No acute  intracranial abnormality. No evidence of intracranial metastatic disease. Electronically Signed   By: Marin Roberts M.D.   On: 09/23/2022 13:22   VAS [place] LOWER EXTREMITY VENOUS (DVT)  Result Date: 09/22/2022  Lower Venous DVT Study Patient Name:  HARLOW MCDERMOTT  Date of Exam:   09/22/2022 Medical Rec #: [id number]         Accession #:    [id number] Date of Birth: 06/21/1946         Patient Gender: M Patient Age:   26 years Exam Location:  [facility] Procedure:      VAS [place] LOWER EXTREMITY VENOUS (DVT) Referring Phys: Nyoka Lint DOUTOVA --------------------------------------------------------------------------------  Indications: Pulmonary embolism.  Risk Factors: Cancer history - lung. Comparison Study: CTA chest yesterday was positive for pulmonary embolism.                    05-05-2021 Prior bilateral lower extremity venous study was                   negative for DVT. Performing Technologist: Darlin Coco RDMS, RVT  Examination Guidelines: A complete evaluation includes B-mode imaging, spectral Doppler, color Doppler, and power Doppler as needed of all accessible portions of each vessel. Bilateral testing is considered an integral part of a complete examination. Limited examinations for reoccurring indications may be performed as noted. The reflux portion of the exam is performed with the patient in reverse Trendelenburg.  +---------+---------------+---------+-----------+----------+--------------+ RIGHT    CompressibilityPhasicitySpontaneityPropertiesThrombus Aging +---------+---------------+---------+-----------+----------+--------------+ CFV      Full           Yes      Yes                                 +---------+---------------+---------+-----------+----------+--------------+ SFJ      Full                                                        +---------+---------------+---------+-----------+----------+--------------+ FV Prox  Full                                                         +---------+---------------+---------+-----------+----------+--------------+ FV Mid   Full                                                        +---------+---------------+---------+-----------+----------+--------------+ FV DistalFull                                                        +---------+---------------+---------+-----------+----------+--------------+ PFV      Full                                                        +---------+---------------+---------+-----------+----------+--------------+  POP      Full           Yes      Yes                                 +---------+---------------+---------+-----------+----------+--------------+ PTV      Full                                                        +---------+---------------+---------+-----------+----------+--------------+ PERO     Full                                                        +---------+---------------+---------+-----------+----------+--------------+ Gastroc  Full                                                        +---------+---------------+---------+-----------+----------+--------------+ SSV      Partial        Yes      Yes                  Acute          +---------+---------------+---------+-----------+----------+--------------+   +---------+---------------+---------+-----------+----------+-----------------+ LEFT     CompressibilityPhasicitySpontaneityPropertiesThrombus Aging    +---------+---------------+---------+-----------+----------+-----------------+ CFV      Full           Yes      Yes                                    +---------+---------------+---------+-----------+----------+-----------------+ SFJ      Full                                                           +---------+---------------+---------+-----------+----------+-----------------+ FV Prox  Full                                                            +---------+---------------+---------+-----------+----------+-----------------+ FV Mid   Full                                                           +---------+---------------+---------+-----------+----------+-----------------+ FV DistalPartial        Yes      Yes                  Age Indeterminate +---------+---------------+---------+-----------+----------+-----------------+ PFV  Full                                                           +---------+---------------+---------+-----------+----------+-----------------+ POP      Full           Yes      Yes                                    +---------+---------------+---------+-----------+----------+-----------------+ PTV      Full                                                           +---------+---------------+---------+-----------+----------+-----------------+ Gastroc  Full                                                           +---------+---------------+---------+-----------+----------+-----------------+ SSV      Partial        Yes      Yes                  Acute             +---------+---------------+---------+-----------+----------+-----------------+     Summary: RIGHT: - Findings consistent with acute superficial vein thrombosis involving the right small saphenous vein. - No cystic structure found in the popliteal fossa.  LEFT: - Findings consistent with partial, non-occlusive acute deep vein thrombosis involving the distal left femoral vein. - Findings consistent with acute superficial vein thrombosis involving the left small saphenous vein. - No cystic structure found in the popliteal fossa.  *See table(s) above for measurements and observations. Electronically signed by Harold Barban MD on 09/22/2022 at 10:11:13 PM.    Final    ECHOCARDIOGRAM COMPLETE  Result Date: 09/22/2022    ECHOCARDIOGRAM REPORT   Patient Name:   JAMILE DEPETRIS Date of Exam: 09/22/2022 Medical Rec #:  [id number]         Height:       71.0 in Accession #:    [id number]       Weight:       182.5 lb Date of Birth:  [birth year]        BSA:          2.028 m Patient Age:    27 years         BP:           111/80 mmHg Patient Gender: M                HR:           59 bpm. Exam Location:  Inpatient Procedure: 2D Echo, Cardiac Doppler and Color Doppler Indications:    I26.02 Pulmonary embolus  History:        Patient has prior history of Echocardiogram examinations. CAD,                 Arrythmias:RBBB; Risk Factors:Hypertension  and Dyslipidemia.  Sonographer:    Phineas Douglas Referring Phys: [id number] [place]  1. Left ventricular ejection fraction, by estimation, is 50 to 55%. The left ventricle has low normal function. The left ventricle has no regional wall motion abnormalities. There is moderate left ventricular hypertrophy. Left ventricular diastolic parameters are consistent with Grade I diastolic dysfunction (impaired relaxation).  2. Right ventricular systolic function is mildly reduced. The right ventricular size is normal. There is normal pulmonary artery systolic pressure. The estimated right ventricular systolic pressure is Q000111Q mmHg.  3. Left atrial size was moderately dilated.  4. The mitral valve is grossly normal. Mild mitral valve regurgitation. No evidence of mitral stenosis.  5. The aortic valve is tricuspid. Aortic valve regurgitation is not visualized. Aortic valve sclerosis/calcification is present, without any evidence of aortic stenosis.  6. Aortic dilatation noted. There is mild dilatation of the aortic root, measuring 42 mm.  7. The inferior vena cava is normal in size with greater than 50% respiratory variability, suggesting right atrial pressure of 3 mmHg. Comparison(s): No significant change from prior study. FINDINGS  Left Ventricle: Left ventricular ejection fraction, by estimation, is 50 to 55%. The left ventricle has low normal function. The left ventricle has no regional wall motion  abnormalities. The left ventricular internal cavity size was normal in size. There is moderate left ventricular hypertrophy. Left ventricular diastolic parameters are consistent with Grade I diastolic dysfunction (impaired relaxation). Right Ventricle: The right ventricular size is normal. No increase in right ventricular wall thickness. Right ventricular systolic function is mildly reduced. There is normal pulmonary artery systolic pressure. The tricuspid regurgitant velocity is 2.78 m/s, and with an assumed right atrial pressure of 3 mmHg, the estimated right ventricular systolic pressure is Q000111Q mmHg. Left Atrium: Left atrial size was moderately dilated. Right Atrium: Right atrial size was normal in size. Pericardium: There is no evidence of pericardial effusion. Mitral Valve: The mitral valve is grossly normal. Mild mitral valve regurgitation. No evidence of mitral valve stenosis. Tricuspid Valve: The tricuspid valve is grossly normal. Tricuspid valve regurgitation is mild . No evidence of tricuspid stenosis. Aortic Valve: The aortic valve is tricuspid. Aortic valve regurgitation is not visualized. Aortic valve sclerosis/calcification is present, without any evidence of aortic stenosis. Aortic valve mean gradient measures 5.3 mmHg. Aortic valve peak gradient measures 9.2 mmHg. Aortic valve area, by VTI measures 1.93 cm. Pulmonic Valve: The pulmonic valve was grossly normal. Pulmonic valve regurgitation is not visualized. No evidence of pulmonic stenosis. Aorta: Aortic dilatation noted. There is mild dilatation of the aortic root, measuring 42 mm. Venous: The inferior vena cava is normal in size with greater than 50% respiratory variability, suggesting right atrial pressure of 3 mmHg. IAS/Shunts: The atrial septum is grossly normal.  LEFT VENTRICLE PLAX 2D LVIDd:         5.10 cm      Diastology LVIDs:         3.90 cm      LV e' medial:    3.92 cm/s LV PW:         1.40 cm      LV E/e' medial:  13.0 LV IVS:         1.50 cm      LV e' lateral:   6.42 cm/s LVOT diam:     1.90 cm      LV E/e' lateral: 7.9 LV SV:         64 LV SV Index:   32  LVOT Area:     2.84 cm  LV Volumes (MOD) LV vol d, MOD A2C: 139.0 ml LV vol d, MOD A4C: 126.0 ml LV vol s, MOD A2C: 68.1 ml LV vol s, MOD A4C: 61.3 ml LV SV MOD A2C:     70.9 ml LV SV MOD A4C:     126.0 ml LV SV MOD BP:      68.0 ml RIGHT VENTRICLE             IVC RV Basal diam:  4.30 cm     IVC diam: 0.90 cm RV S prime:     10.20 cm/s TAPSE (M-mode): 1.4 cm LEFT ATRIUM             Index        RIGHT ATRIUM           Index LA diam:        3.90 cm 1.92 cm/m   RA Area:     20.10 cm LA Vol (A2C):   94.3 ml 46.49 ml/m  RA Volume:   48.80 ml  24.06 ml/m LA Vol (A4C):   91.3 ml 45.01 ml/m LA Biplane Vol: 94.1 ml 46.39 ml/m  AORTIC VALVE AV Area (Vmax):    1.83 cm AV Area (Vmean):   1.70 cm AV Area (VTI):     1.93 cm AV Vmax:           152.03 cm/s AV Vmean:          109.505 cm/s AV VTI:            0.332 m AV Peak Grad:      9.2 mmHg AV Mean Grad:      5.3 mmHg LVOT Vmax:         98.30 cm/s LVOT Vmean:        65.600 cm/s LVOT VTI:          0.226 m LVOT/AV VTI ratio: 0.68  AORTA Ao Root diam: 4.20 cm Ao Asc diam:  3.60 cm MITRAL VALVE               TRICUSPID VALVE MV Area (PHT): 2.28 cm    TR Peak grad:   30.9 mmHg MV Decel Time: 333 msec    TR Vmax:        278.00 cm/s MV E velocity: 51.00 cm/s MV A velocity: 60.40 cm/s  SHUNTS MV E/A ratio:  0.84        Systemic VTI:  0.23 m                            Systemic Diam: 1.90 cm Eleonore Chiquito MD Electronically signed by Eleonore Chiquito MD Signature Date/Time: 09/22/2022/9:40:29 AM    Final    CT Angio Chest PE W and/or Wo Contrast  Result Date: 09/21/2022 CLINICAL DATA:  Shortness of breath. History of pulmonary embolism and lung cancer. EXAM: CT ANGIOGRAPHY CHEST WITH CONTRAST TECHNIQUE: Multidetector CT imaging of the chest was performed using the standard protocol during bolus administration of intravenous contrast. Multiplanar CT image  reconstructions and MIPs were obtained to evaluate the vascular anatomy. RADIATION DOSE REDUCTION: This exam was performed according to the departmental dose-optimization program which includes automated exposure control, adjustment of the mA and/or kV according to patient size and/or use of iterative reconstruction technique. CONTRAST:  61m OMNIPAQUE IOHEXOL 350 MG/ML SOLN COMPARISON:  PET-CT dated July 17, 2022. FINDINGS: Cardiovascular: Satisfactory opacification of the pulmonary  arteries to the segmental level. Acute occlusive pulmonary emboli in the right interlobar pulmonary artery, right middle lobar and segmental pulmonary arteries, and right superior and basal segmental pulmonary arteries. Mild cardiomegaly with right heart enlargement. Elevated RV/LV ratio of 1.35. Prior CABG. No pericardial effusion. No thoracic aortic aneurysm or dissection. Coronary, aortic arch, and branch vessel atherosclerotic vascular disease. Mediastinum/Nodes: No enlarged mediastinal, hilar, or axillary lymph nodes. Unchanged calcified right hilar and subcarinal lymph nodes. Thyroid gland, trachea, and esophagus demonstrate no significant findings. Lungs/Pleura: Postsurgical changes from prior left upper lobectomy. Small subpleural opacity in the superior segment of the right upper lobe may reflect developing infarct or atelectasis. New mild right basilar subsegmental atelectasis. Unchanged calcified granuloma in the posterior right lower lobe. Unchanged small loculated left pleural effusion with significant irregular pleural thickening at the posterior costophrenic angle. Unchanged scarring in the posterior left lower lobe. Upper Abdomen: Enlarging left adrenal metastasis, currently 4.6 x 3.3 cm, previously nonmeasurable. Unchanged right adrenal metastasis. Musculoskeletal: Unchanged T1 and T10 sclerotic metastases. Review of the MIP images confirms the above findings. IMPRESSION: 1. Acute occlusive lobar and segmental  pulmonary emboli in the right middle and lower lobes. Positive for acute PE with CT evidence of right heart strain (RV/LV Ratio = 1.35 ) consistent with at least submassive (intermediate risk) PE. The presence of right heart strain has been associated with an increased risk of morbidity and mortality. Please refer to the "PE Focused" order set in EPIC. 2. Small subpleural opacity in the superior segment of the right upper lobe may reflect developing infarct or atelectasis. 3. Unchanged small loculated left pleural effusion with significant irregular pleural thickening at the posterior costophrenic angle, concerning for malignancy. 4. Enlarging left adrenal metastasis, currently 4.6 x 3.3 cm, previously non-measurable. Unchanged right adrenal metastasis. 5. Unchanged T1 and T10 sclerotic metastases. 6.  Aortic atherosclerosis (ICD10-I70.0). Critical Value/emergent results were called by telephone at the time of interpretation on 09/21/2022 at 5:59 pm to provider DAN FLOYD, who verbally acknowledged these results. Electronically Signed   By: Titus Dubin M.D.   On: 09/21/2022 18:03   CT Head Wo Contrast  Result Date: 09/21/2022 CLINICAL DATA:  Gait instability.  History of lung cancer. EXAM: CT HEAD WITHOUT CONTRAST TECHNIQUE: Contiguous axial images were obtained from the base of the skull through the vertex without intravenous contrast. RADIATION DOSE REDUCTION: This exam was performed according to the departmental dose-optimization program which includes automated exposure control, adjustment of the mA and/or kV according to patient size and/or use of iterative reconstruction technique. COMPARISON:  CT head dated August 19, 2022 FINDINGS: Brain: No evidence of acute infarction, hemorrhage, hydrocephalus, extra-axial collection or mass lesion/mass effect. Low-attenuation of the periventricular white matter presumed chronic microvascular ischemic changes. Vascular: No hyperdense vessel or unexpected  calcification. Skull: Normal. Negative for fracture or focal lesion. Sinuses/Orbits: Partial opacification of the left maxillary sinus. Remaining paranasal sinuses and mastoid air cells are clear. Other: None. IMPRESSION: 1. No acute intracranial abnormality. 2. Chronic microvascular ischemic changes of the white matter. 3. Partial opacification of the left maxillary sinus. Electronically Signed   By: Keane Police D.O.   On: 09/21/2022 17:23   DG Chest Port 1 View  Result Date: 09/21/2022 CLINICAL DATA:  Shortness of breath.  New oxygen requirement. EXAM: PORTABLE CHEST 1 VIEW COMPARISON:  09/08/2022. FINDINGS: 1343 hours. Unchanged small left pleural effusion and chronic volume loss in the left hemithorax. Unchanged nodular opacity in the right mid lung. No new airspace opacity.  Stable cardiac and mediastinal contours. No pneumothorax. IMPRESSION: Unchanged small left pleural effusion. Unchanged nodular opacity in the right mid lung. Electronically Signed   By: Emmit Alexanders M.D.   On: 09/21/2022 13:52   DG Chest 2 View  Result Date: 09/10/2022 CLINICAL DATA:  Shortness of breath. Non-small cell lung cancer, stage IV, left. EXAM: CHEST - 2 VIEW COMPARISON:  Chest radiograph 08/19/2022., PET CT 07/17/2022, spine MRI 2 in 24 FINDINGS: Prior median sternotomy. Chronic volume loss in the left hemithorax. Left pleural effusion has diminished from prior exam. Persistent blunting of left costophrenic angle and small volume pleural fluid and irregularity. No right pleural effusion. Stable heart size and mediastinal contours, aortic atherosclerosis. No evidence of acute airspace disease. No pulmonary edema. No pneumothorax. Known thoracic spine metastatic disease is not well delineated by radiograph. IMPRESSION: 1. Left pleural effusion has decreased in size from prior exam. Persistent pleural thickening and irregularity consistent with known malignancy. 2. Chronic volume loss in the left hemithorax. Electronically  Signed   By: Keith Rake M.D.   On: 09/10/2022 13:02      Subjective: Denies complaints.  States that he has been eating well, having BMs including last night.  No dyspnea.  Ambulated the halls yesterday  Discharge Exam:  Vitals:   09/24/22 2330 09/25/22 0344 09/25/22 0842 09/25/22 1119  BP: 132/81 (!) 141/86 138/75 (!) 149/81  Pulse: 64 (!) 58 61 64  Resp: '14 14 17 20  '$ Temp: 98.3 F (36.8 C) 98 F (36.7 C) 98.3 F (36.8 C) 97.7 F (36.5 C)  TempSrc: Oral Oral Oral Oral  SpO2: 98% 97% 98% 99%  Weight:      Height:        General exam: Elderly male, moderately built and nourished lying comfortably propped up in bed without distress.  Subsequently seen ambulating steadily and without discomfort with PT, using a walker in the halls. Respiratory system: Slightly diminished breath sounds in the bases but otherwise clear to auscultation.  No increased work of breathing.  Midline sternotomy scar.  Stable without Cardiovascular system: S1 & S2 heard, RRR. No JVD, murmurs, rubs, gallops or clicks. No pedal edema.  Telemetry was personally reviewed and findings as noted above. Gastrointestinal system: Abdomen is nondistended, soft and nontender. No organomegaly or masses felt. Normal bowel sounds heard. Central nervous system: Alert and oriented. No focal neurological deficits. Extremities: Symmetric 5 x 5 power. Skin: No rashes, lesions or ulcers Psychiatry: Judgement and insight appears somewhat impaired. Mood & affect mostly flat    The results of significant diagnostics from this hospitalization (including imaging, microbiology, ancillary and laboratory) are listed below for reference.     Microbiology: Recent Results (from the past 240 hour(s))  Resp panel by RT-PCR (RSV, Flu A&B, Covid) Anterior Nasal Swab     Status: None   Collection Time: 09/21/22  1:36 PM   Specimen: Anterior Nasal Swab  Result Value Ref Range Status   SARS Coronavirus 2 by RT PCR NEGATIVE NEGATIVE  Final   Influenza A by PCR NEGATIVE NEGATIVE Final   Influenza B by PCR NEGATIVE NEGATIVE Final    Comment: (NOTE) The Xpert Xpress SARS-CoV-2/FLU/RSV plus assay is intended as an aid in the diagnosis of influenza from Nasopharyngeal swab specimens and should not be used as a sole basis for treatment. Nasal washings and aspirates are unacceptable for Xpert Xpress SARS-CoV-2/FLU/RSV testing.  Fact Sheet for Patients: EntrepreneurPulse.com.au  Fact Sheet for Healthcare Providers: IncredibleEmployment.be  This test is not  yet approved or cleared by the [place] and has been authorized for detection and/or diagnosis of SARS-CoV-2 by FDA under an Emergency Use Authorization (EUA). This EUA will remain in effect (meaning this test can be used) for the duration of the COVID-19 declaration under Section 564(b)(1) of the Act, 21 U.S.C. section 360bbb-3(b)(1), unless the authorization is terminated or revoked.     Resp Syncytial Virus by PCR NEGATIVE NEGATIVE Final    Comment: (NOTE) Fact Sheet for Patients: EntrepreneurPulse.com.au  Fact Sheet for Healthcare Providers: IncredibleEmployment.be  This test is not yet approved or cleared by the [place] FDA and has been authorized for detection and/or diagnosis of SARS-CoV-2 by FDA under an Emergency Use Authorization (EUA). This EUA will remain in effect (meaning this test can be used) for the duration of the COVID-19 declaration under Section 564(b)(1) of the Act, 21 U.S.C. section 360bbb-3(b)(1), unless the authorization is terminated or revoked.  Performed at [facility], [place] [address], [place], [place] [address]   MRSA Next Gen by PCR, Nasal     Status: None   Collection Time: 09/22/22  4:26 AM   Specimen: Urine, Clean Catch; Nasal Swab  Result Value Ref Range Status   MRSA by PCR Next Gen NOT DETECTED NOT DETECTED Final    Comment:  (NOTE) The GeneXpert MRSA Assay (FDA approved for NASAL specimens only), is one component of a comprehensive MRSA colonization surveillance program. It is not intended to diagnose MRSA infection nor to guide or monitor treatment for MRSA infections. Test performance is not FDA approved in patients less than 69 years old. Performed at [facility], [place] [address] [place], [place] [address]      Labs: CBC: Recent Labs  Lab 09/21/22 1348 09/21/22 1402 09/22/22 0747 09/23/22 0125 09/24/22 0133 09/25/22 0120  WBC 5.4  --  5.4 6.2 5.4 5.1  NEUTROABS 4.8  --   --   --   --   --   HGB 11.6* 11.6* 10.6* 9.7* 9.4* 9.5*  HCT 36.4* 34.0* 30.8* 27.8* 27.2* 27.5*  MCV 102.0*  --  97.5 96.9 96.8 96.8  PLT 113*  --  89* 88* 85* 78*    Basic Metabolic Panel: Recent Labs  Lab 09/21/22 1457 09/21/22 1940 09/22/22 0747 09/23/22 0125 09/24/22 0133 09/25/22 0120  NA 139  --  139 139 140 141  K 3.4*  --  3.7 4.1 3.8 3.8  CL 105  --  111 110 108 111  CO2 24  --  20* 21* 21* 23  GLUCOSE 122*  --  132* 153* 142* 132*  BUN 27*  --  25* 29* 24* 22  CREATININE 1.43*  --  1.09 1.23 1.02 1.07  CALCIUM 8.1*  --  7.8* 7.9* 7.9* 7.9*  MG  --  2.5* 2.6* 2.6* 2.4 2.5*  PHOS  --  2.7 2.7  --   --   --     Liver Function Tests: Recent Labs  Lab 09/21/22 1946 09/22/22 0747 09/24/22 0133 09/25/22 0120  AST '26 16 16 20  '$ ALT '15 13 18 21  '$ ALKPHOS 43 40 43 41  BILITOT 1.3* 1.0 0.3 0.6  PROT 5.6* 5.4* 5.1* 5.2*  ALBUMIN 3.1* 3.0* 2.8* 2.9*    Anemia work up Recent Labs    09/24/22 0133  TIBC 245*  IRON 149  RETICCTPCT 1.1    Urinalysis    Component Value Date/Time   COLORURINE YELLOW 09/22/2022 0612  APPEARANCEUR CLEAR 09/22/2022 0612   LABSPEC 1.045 (H) 09/22/2022 0612   PHURINE 5.0 09/22/2022 0612   GLUCOSEU NEGATIVE 09/22/2022 0612   HGBUR SMALL (A) 09/22/2022 0612   BILIRUBINUR NEGATIVE 09/22/2022 0612   KETONESUR 20 (A) 09/22/2022 0612   PROTEINUR NEGATIVE  09/22/2022 0612   NITRITE NEGATIVE 09/22/2022 0612   LEUKOCYTESUR NEGATIVE 09/22/2022 0612    Discussed in detail with patient's spouse at bedside, updated care and answered all questions.  Time coordinating discharge: 45 minutes  SIGNED:  Vernell Leep, MD,  FACP, [place], [facility], [facility], [facility]   [facility] [place]     To contact the attending provider between 7A-7P or the covering provider during after hours 7P-7A, please log into the web site [email or web address] and access using universal [place] password for that web site. If you do not have the password, please call the hospital operator.

## 2022-09-25 NOTE — Care Management Important Message (Signed)
Important Message  Patient Details  Name: Darril Suleman MRN: [id number] Date of Birth: 1946-05-10   Medicare Important Message Given:  Yes     Shelda Altes 09/25/2022, 8:46 AM

## 2022-09-25 NOTE — Progress Notes (Signed)
SATURATION QUALIFICATIONS: (This note is used to comply with regulatory documentation for home oxygen)  Patient Saturations on Room Air at Rest = 100%  Patient Saturations on Room Air while Ambulating = 95%  Patient Saturations on 0 Liters of oxygen while Ambulating = N/A  Please briefly explain why patient needs home oxygen: Pt SpO2 within functional limits on room air with exertional tasks, anticipate no supplemental O2 needs at this time.

## 2022-09-25 NOTE — Progress Notes (Signed)
Patient sleeping heart drops to 37 S.B. for a few beats then back up to 47 S.R. Patient has a history of doing this on this adm. Per tele. Tech.

## 2022-09-25 NOTE — Discharge Instructions (Signed)

## 2022-09-25 NOTE — Progress Notes (Signed)
John Hinton has decided not to take any chemotherapy.  I totally respect his decision.  I told him that he could always change his mind if he needed to.  I think we are trying to get him up to [facility].  I am not sure how this will happen or if it will happen.  He will now be switched over to Lovenox for his thromboembolic event.  He probably needs twice a day Lovenox dosing.  He says his breathing is doing better.  He did get out of bed with physical therapy yesterday.  His appetite is improving.  He has had no nausea or vomiting.  He has had no problems with bowels or bladder.  There has been no bleeding.  He has had no fever.  There is been no issues with leg swelling.  Labs show sodium 141.  Potassium 3.8.  BUN 22 creatinine 1.07.  Calcium 7.9 with an albumin of 2.9.  His white cell count is 5.1.  Hemoglobin 9.5.  Platelet count 78,000.  I suppose a platelet count could be going down from the heparin.  We will get him off heparin onto Lovenox.  Vital signs show temperature 98.  Pulse 58.  Blood pressure 141/86.  Oxygen saturation on room air is 97%.  His lungs sound relatively clear bilaterally.  Cardiac exam regular rate and rhythm.  He has no murmurs.  Abdomen is soft.  Bowel sounds are active.  There is no guarding or rebound tenderness.  Extremity shows no clubbing, cyanosis or edema.  Neurological exam is nonfocal.  I am happy to see that Mr. Sabra is more mentally alert.  We had a good conversation this morning.  Again, I told him that I totally respect his decision not to take any treatment.  I would subsequently get Hospice involved once he is discharged from the hospital.  Hospice would not have to do anything right now but at least they will know of him.  I would think that he could probably be taken off the progressive floor.  I am not sure he really needs a cardiac monitor.  Again, we will switch him over to Lovenox.  We will have to watch his platelet  count.  Thanks so much for the great care he is getting from all the staff up on 4 E.   Lattie Haw, MD  Vonna Kotyk 1:9

## 2022-09-25 NOTE — Progress Notes (Signed)
Physical Therapy Treatment Patient Details Name: John Hinton MRN: [id number] DOB: 10-Jan-1946 Today's Date: 09/25/2022   History of Present Illness 77 y.o. male with medical history significant of lung cancer, A-fib with RVR, PE, systolic CHF EF [id number], gout, hypothyroidism, GERD, paraneoplastic syndrome.  Admitted for pleural effusion on left, acute pulmonary embolism with acute cor pulmonale.    PT Comments    Pt received in supine, agreeable to therapy session and with good participation and improved tolerance for gait training. Pt still needs min safety cues for UE placement with standing transfers to/from RW but otherwise with good safety awareness, mild short term memory deficits noted. Pt performed stair negotiation with SpO2 and HR WFL on room air and needed ~3 standing breaks to perform community distance ambulation trial. Spouse John Hinton present for teachback on use of gait belt and all functional mobility tasks. Pt continues to benefit from PT services to progress toward functional mobility goals.    Recommendations for follow up therapy are one component of a multi-disciplinary discharge planning process, led by the attending physician.  Recommendations may be updated based on patient status, additional functional criteria and insurance authorization.  Follow Up Recommendations  Outpatient PT     Assistance Recommended at Discharge Frequent or constant Supervision/Assistance  Patient can return home with the following A little help with walking and/or transfers;A little help with bathing/dressing/bathroom;Assistance with cooking/housework;Assist for transportation;Help with stairs or ramp for entrance   Equipment Recommendations  BSC/3in1 (would be good to have pending progression of his cancer related symptoms and for night time toileting due to pt progressive fatigue in evenings)    Recommendations for Other Services       Precautions / Restrictions Precautions Precautions:  Fall Precaution Comments: h/o falls (last fell at home 09/21/22) per pt spouse Restrictions Weight Bearing Restrictions: No     Mobility  Bed Mobility Overal bed mobility: Modified Independent Bed Mobility: Sit to Supine, Supine to Sit     Supine to sit: Modified independent (Device/Increase time) Sit to supine: Modified independent (Device/Increase time)   General bed mobility comments: no physical assist, good initiation    Transfers Overall transfer level: Needs assistance Equipment used: Rolling walker (2 wheels) Transfers: Sit to/from Stand, Bed to chair/wheelchair/BSC Sit to Stand: Supervision           General transfer comment: cues for safe UE placement needed for RW transfers, pt tends to plop when sitting down when fatigued and forgets to reach back from RW to bed/chair    Ambulation/Gait Ambulation/Gait assistance: Supervision Gait Distance (Feet): 400 Feet (including x3 brief standing breaks) Assistive device: Rolling walker (2 wheels) Gait Pattern/deviations: Step-through pattern Gait velocity: fair cadence     General Gait Details: no loss of balance, good RW management, min cues for activity pacing due to DOE, SpO2 WFL on RA throughout when good pleth signal achieved; HR WFL up to ~81 bpm   Stairs Stairs: Yes Stairs assistance: Min guard Stair Management: One rail Left, Step to pattern, Forwards Number of Stairs: 8 (4 steps up/down x2 trials with short standing break) General stair comments: pt utilized step-to pattern without prompting, good safety and use of rail for stability, spouse instructed on use of gait belt and ideas for activity pacing (chair at top of steps for rest break, etc) SpO2/HR WFL on RA   Wheelchair Mobility    Modified Rankin (Stroke Patients Only)       Balance Overall balance assessment: Needs assistance, History of Falls Sitting-balance support:  Feet supported Sitting balance-Leahy Scale: Good     Standing balance  support: During functional activity, Bilateral upper extremity supported Standing balance-Leahy Scale: Good Standing balance comment: Fair static standing unsupported, good dynamic standing with use of RW                            Cognition Arousal/Alertness: Awake/alert Behavior During Therapy: WFL for tasks assessed/performed Overall Cognitive Status: Impaired/Different from baseline Area of Impairment: Memory, Safety/judgement                     Memory: Decreased short-term memory Following Commands: Follows one step commands consistently, Follows multi-step commands with increased time Safety/Judgement: Decreased awareness of safety   Problem Solving: Requires verbal cues General Comments: Pt pleasantly cooperative, eager to mobilize, following simple commands well. Spouse is able to provide initial consistent supervision/assist upon DC.        Exercises Other Exercises Other Exercises: reviewed use of gait belt as tool for heel cord stretch x30 sec TID ea LE    General Comments General comments (skin integrity, edema, etc.): SpO2 and HR WFL on RA, DOE 2/4 but improves with standing break and cues for pursed-lip breathing BP stable 149/81 (100) sitting EOB post-exertion.      Pertinent Vitals/Pain Pain Assessment Pain Assessment: No/denies pain     PT Goals (current goals can now be found in the care plan section) Acute Rehab PT Goals Patient Stated Goal: to get stronger and go back to OPPT PT Goal Formulation: With patient/family Time For Goal Achievement: 10/06/22 Progress towards PT goals: Progressing toward goals    Frequency    Min 3X/week      PT Plan Current plan remains appropriate       AM-PAC PT "6 Clicks" Mobility   Outcome Measure  Help needed turning from your back to your side while in a flat bed without using bedrails?: None Help needed moving from lying on your back to sitting on the side of a flat bed without using  bedrails?: None Help needed moving to and from a bed to a chair (including a wheelchair)?: A Little Help needed standing up from a chair using your arms (e.g., wheelchair or bedside chair)?: A Little Help needed to walk in hospital room?: A Little Help needed climbing 3-5 steps with a railing? : A Little 6 Click Score: 20    End of Session Equipment Utilized During Treatment: Gait belt (brought O2 tank but did not need to use it.) Activity Tolerance: Patient tolerated treatment well Patient left: with call bell/phone within reach;Other (comment);in bed;with nursing/sitter in room;with family/visitor present (NT in room to assist him with bathing, spouse also present) Nurse Communication: Mobility status PT Visit Diagnosis: Unsteadiness on feet (R26.81);History of falling (Z91.81);Muscle weakness (generalized) (M62.81);Difficulty in walking, not elsewhere classified (R26.2)     Time: [id number] PT Time Calculation (min) (ACUTE ONLY): 26 min  Charges:  $Gait Training: 23-37 mins                     Alizey Noren P., PTA Acute Rehabilitation Services Secure Chat Preferred 9a-5:30pm Office: [place] 09/25/2022, 11:34 AM

## 2022-09-25 NOTE — Progress Notes (Signed)
Inpatient Rehabilitation Admissions Coordinator   As noted per PTA , patient has progressed to level not to need an inpt rehab admit. Outpatient therapy is recommended at this time. We will sign off at this time. I will alert TOC.  Danne Baxter, RN, MSN Rehab Admissions Coordinator [phone] 09/25/2022 8:36 AM

## 2022-09-25 NOTE — Progress Notes (Signed)
John Hinton to be D/C'd Home per MD order.  Discussed with the patient and all questions fully answered.  VSS, Skin clean, dry and intact without evidence of skin break down, no evidence of skin tears noted. IV catheter discontinued intact. Site without signs and symptoms of complications. Dressing and pressure applied.  An After Visit Summary was printed and given to the patient. Patient received prescription.  D/c education completed with patient/family including follow up instructions, medication list, d/c activities limitations if indicated, with other d/c instructions as indicated by MD - patient able to verbalize understanding, all questions fully answered.   Patient instructed to return to ED, call 911, or call MD for any changes in condition.   Patient escorted via [place], and D/C home via private auto.  Lillia Abed [name] 09/25/2022 6:05 PM

## 2022-09-25 NOTE — Progress Notes (Signed)
Mobility Specialist Progress Note:   09/25/22 1017  Mobility  Activity Ambulated with assistance in hallway  Level of Assistance Standby assist, set-up cues, supervision of patient - no hands on  Assistive Device Front wheel walker  Distance Ambulated (ft) 450 ft  Activity Response Tolerated well  $Mobility charge 1 Mobility   Pt in bed willing to participate in mobility. No complaints of pain. Left in chair with call bell in reach and all needs met.   Gareth Eagle Moshe Wenger Mobility Specialist Please contact via [facility] [employer] or  Rehab Office at [phone]

## 2022-09-25 NOTE — Progress Notes (Signed)
[place] for Lovnenox Indication: atrial fibrillation and acute pulmonary embolus  Allergies  Allergen Reactions   Amlodipine Swelling    Swelling of ankles    Patient Measurements: Height: '5\' 11"'$  (180.3 cm) Weight: 82.8 kg (182 lb 8.7 oz) IBW/kg (Calculated) : 75.3 Heparin Dosing Weight: 82.8 kg  Vital Signs: Temp: 98 F (36.7 C) (03/08 0344) Temp Source: Oral (03/08 0344) BP: 141/86 (03/08 0344) Pulse Rate: 58 (03/08 0344)  Labs: Recent Labs    09/23/22 0125 09/24/22 0133 09/24/22 1853 09/25/22 0120  HGB 9.7* 9.4*  --  9.5*  HCT 27.8* 27.2*  --  27.5*  PLT 88* 85*  --  78*  APTT 89* 52* 47* 81*  HEPARINUNFRC >1.10* 0.72*  --  0.73*  CREATININE 1.23 1.02  --  1.07     Estimated Creatinine Clearance: 64.5 mL/min (by C-G formula based on SCr of 1.07 mg/dL).   Assessment: 77 y.o. male with h/o PE on Eliquis PTA, now with new PE/DVT on heparin 3/4-3/7, to transition to Lovenox    Goal of Therapy:  Full anticoagulation with Lovenox Monitor platelets by anticoagulation protocol: Yes   Plan:  Lovenox 80 mg SQ q12h  Phillis Knack, PharmD, BCPS  09/25/2022 5:40 AM

## 2022-09-25 NOTE — TOC Transition Note (Signed)
Transition of Care (TOC) - CM/SW Discharge Note Marvetta Gibbons RN, BSN Transitions of Care Unit 4E- RN Case Manager See Treatment Team for direct phone #   Patient Details  Name: John Hinton MRN: [id number] Date of Birth: Nov 03, 1945  Transition of Care [facility]) CM/SW Contact:  Dawayne Patricia, RN Phone Number: 09/25/2022, 3:02 PM   Clinical Narrative:    Pt stable for transition home today w/ wife.  CM spoke with pt and wife to f/u on transition needs. Recommendations have been updated to outpt therapy follow up.   Per conversation with pt and wife at bedside- pt has been doing outpt therapy at [facility] location and would like to continue there with outpt PT/OT needs.  New referral has been sent under verbal order by attending for outpt PT/OT via Epic to [facility]. Wife to f/u with regards to scheduling.   Per wife pt has elected not to do Chemo- and they would like referral to Hospice, discussed which Hospice they would like- and wife states "the one on [place]" - which is [facility]- wife in under the impression that Oncology is going to make referral- and will follow up w/ Dr. Antonieta Pert office to be sure they send it to [place].   Discussed recommendations made for DME- BSC/3n1- per wife she does not feel they will need BSC at this time and declines DME. Pt has RW at home and wife to f/u with outpt PT with regards to rollator option (if pt safe to use).   [place] with medication cost for Lovenox- pt will need '80mg'$ /BID-   1430- after benefits check and cost checks- wife has elected to pay out of pocket for initial 30 day of Lovenox as insurance cost is higher due to pt just filling Eliquis on 09/22/22- Pt will need to f/u with Dr. Marin Olp to see if Lovenox will need -pre-auth prior to next month- wife aware and to f/u. For discharge- wife will plan to use GoodRx coupon and fill at [facility]- (however [facility] does not have in stock so will need to order to  have next week)- TOC pharmacy to assist with first 7 day supply to give [facility] time to get drug in stock- wife agreeable to cost of $100 for 7 day supply (14 syringes)  No further TOC needs noted- wife to transport home, once [facility] pharmacy fills Lovenox and delivers to bedside.    Final next level of care: OP Rehab Barriers to Discharge: Barriers Resolved   Patient Goals and CMS Choice CMS Medicare.gov Compare Post Acute Care list provided to:: Patient Choice offered to / list presented to : Patient, Spouse  Discharge Placement                 Home        Discharge Plan and Services Additional resources added to the After Visit Summary for     Discharge Planning Services: CM Consult Post Acute Care Choice: IP Rehab          DME Arranged: N/A DME Agency: NA       HH Arranged: NA HH Agency: NA        Social Determinants of Health (SDOH) Interventions SDOH Screenings   Food Insecurity: No Food Insecurity (09/22/2022)  Housing: Low Risk  (09/22/2022)  Transportation Needs: No Transportation Needs (09/22/2022)  Utilities: Not At Risk (09/22/2022)  Financial Resource Strain: Low Risk  (06/18/2022)  Stress: No Stress Concern Present (06/18/2022)  Tobacco Use: Medium Risk (09/21/2022)  Readmission Risk Interventions    09/25/2022   11:37 AM 08/25/2022    2:15 PM  Readmission Risk Prevention Plan  Transportation Screening Complete Complete  PCP or Specialist Appt within 5-7 Days Complete Complete  Home Care Screening Complete Complete  Medication Review (RN CM) Complete Complete

## 2022-09-26 LAB — ERYTHROPOIETIN: Erythropoietin: 137.8 m[IU]/mL — ABNORMAL HIGH (ref 2.6–18.5)

## 2022-09-28 ENCOUNTER — Encounter (HOSPITAL_COMMUNITY)
Admission: RE | Admit: 2022-09-28 | Discharge: 2022-09-28 | Disposition: A | Discharge status: Home / Self Care | Payer: Medicare Other | Source: Ambulatory Visit | Attending: Hematology & Oncology | Admitting: Hematology & Oncology

## 2022-09-28 ENCOUNTER — Ambulatory Visit: Payer: Medicare Other | Attending: Family Medicine

## 2022-09-28 DIAGNOSIS — C3431 Malignant neoplasm of lower lobe, right bronchus or lung: Secondary | ICD-10-CM | POA: Diagnosis not present

## 2022-09-28 DIAGNOSIS — M6281 Muscle weakness (generalized): Secondary | ICD-10-CM | POA: Diagnosis not present

## 2022-09-28 DIAGNOSIS — C3490 Malignant neoplasm of unspecified part of unspecified bronchus or lung: Secondary | ICD-10-CM | POA: Diagnosis not present

## 2022-09-28 DIAGNOSIS — R262 Difficulty in walking, not elsewhere classified: Secondary | ICD-10-CM | POA: Insufficient documentation

## 2022-09-28 DIAGNOSIS — C3492 Malignant neoplasm of unspecified part of left bronchus or lung: Secondary | ICD-10-CM | POA: Diagnosis not present

## 2022-09-28 DIAGNOSIS — C779 Secondary and unspecified malignant neoplasm of lymph node, unspecified: Secondary | ICD-10-CM | POA: Insufficient documentation

## 2022-09-28 DIAGNOSIS — I700 Atherosclerosis of aorta: Secondary | ICD-10-CM | POA: Insufficient documentation

## 2022-09-28 LAB — GLUCOSE, CAPILLARY: Glucose-Capillary: 98 mg/dL (ref 70–99)

## 2022-09-28 MED ORDER — FLUDEOXYGLUCOSE F - 18 (FDG) INJECTION
9.1100 | Freq: Once | INTRAVENOUS | Status: AC | PRN
  Administered 2022-09-28: 9.11 via INTRAVENOUS

## 2022-09-28 NOTE — Therapy (Signed)
OUTPATIENT PHYSICAL THERAPY ONCOLOGY TREATMENT  Patient Name: John Hinton MRN: [id number] DOB:27-Sep-1945, 77 y.o., male Today's Date: 09/28/2022  END OF SESSION:  PT End of Session - 09/28/22 1356     Visit Number 6    Number of Visits 18    Date for PT Re-Evaluation 11/09/22    PT Start Time 1400    PT Stop Time 1445    PT Time Calculation (min) 45 min    Equipment Utilized During Treatment Gait belt    Activity Tolerance Patient tolerated treatment well    Behavior During Therapy WFL for tasks assessed/performed              Past Medical History:  Diagnosis Date   BPH (benign prostatic hypertrophy)    Bradycardia    a. H/o asymptomatic bradycardia.   Coronary artery disease    a. s/p CABG ~2007 with LIMA to LAD, SVG seq to OM1 and OM2, SVG to PDA and SVG to diag. b. Abnormal nuc 03/2015 - s/p DES to SVG-OM1-OM2. 01/29/17 POBA with cutting balloon to SVG-->OM.  No ischemia Lexi myoview 2021   DCM (dilated cardiomyopathy) ([place]) 09/17/2015   mixed by cMRI 03/2021 with EF 39% with non viable infarct in basal to mid lateral wall and LGE pattern c/w nonischemic component as well. Mild RV dysfunction RVEF 42%.   Dyslipidemia    Dyspnea    r/t lung mass   ED (erectile dysfunction)    Fear of heights    Goals of care, counseling/discussion 06/19/2020   History of radiation therapy    T spine and left ilium 10/07/2021-10/20/2021  Dr Gery Pray   Hypertension    Non-small cell carcinoma of lung, stage 4, left ([place]) 06/19/2020   OSA (obstructive sleep apnea) 07/12/2015   Moderate OSA with AHI 16.5/hr now on CPAP at 8cm H2O, uses cpap nightly      RBBB    Shoulder, capsulitis, adhesive    Left Shoulder   Past Surgical History:  Procedure Laterality Date   APPENDECTOMY     BRONCHIAL BIOPSY  05/09/2020   Procedure: BRONCHIAL BIOPSIES;  Surgeon: [name], DO;  Location: [facility];  Service: Pulmonary;;   BRONCHIAL BRUSHINGS  05/09/2020   Procedure: BRONCHIAL  BRUSHINGS;  Surgeon: [name], DO;  Location: [facility];  Service: Pulmonary;;   BRONCHIAL NEEDLE ASPIRATION BIOPSY  05/09/2020   Procedure: BRONCHIAL NEEDLE ASPIRATION BIOPSIES;  Surgeon: [name], DO;  Location: [facility];  Service: Pulmonary;;   BRONCHIAL WASHINGS  05/09/2020   Procedure: BRONCHIAL WASHINGS;  Surgeon: [name], DO;  Location: [facility];  Service: Pulmonary;;   CARDIAC CATHETERIZATION N/A 04/05/2015   Procedure: Left Heart Cath and Coronary Angiography;  Surgeon: Sherren Mocha, MD;  Location: [place] [facility];  Service: Cardiovascular;  Laterality: N/A;   COLONOSCOPY     CORONARY ARTERY BYPASS GRAFT     w/LIMA to LAD, seq SVG to OM1 and OM2, SVG to PDA and SVG to Diagonal   CORONARY BALLOON ANGIOPLASTY N/A 01/29/2017   Procedure: Coronary Balloon Angioplasty;  Surgeon: Martinique, Peter M, MD;  Location: [place] [facility];  Service: Cardiovascular;  Laterality: N/A;   FIDUCIAL MARKER PLACEMENT  05/09/2020   Procedure: FIDUCIAL MARKER PLACEMENT;  Surgeon: [name], DO;  Location: [facility];  Service: Pulmonary;;   INTERCOSTAL NERVE BLOCK Left 05/22/2020   Procedure: INTERCOSTAL NERVE BLOCK;  Surgeon: Lajuana Matte, MD;  Location: [place];  Service: Thoracic;  Laterality:  Left;   LEFT HEART CATH AND CORS/GRAFTS ANGIOGRAPHY N/A 01/29/2017   Procedure: Left Heart Cath and Cors/Grafts Angiography;  Surgeon: Martinique, Peter M, MD;  Location: [place] [facility];  Service: Cardiovascular;  Laterality: N/A;   NODE DISSECTION N/A 05/22/2020   Procedure: NODE DISSECTION;  Surgeon: Lajuana Matte, MD;  Location: [place];  Service: Thoracic;  Laterality: N/A;   VIDEO BRONCHOSCOPY WITH ENDOBRONCHIAL NAVIGATION N/A 05/09/2020   Procedure: VIDEO BRONCHOSCOPY WITH ENDOBRONCHIAL NAVIGATION;  Surgeon: [name], DO;  Location: [place];  Service: Pulmonary;  Laterality: N/A;   VIDEO BRONCHOSCOPY WITH ENDOBRONCHIAL ULTRASOUND N/A  05/09/2020   Procedure: VIDEO BRONCHOSCOPY WITH ENDOBRONCHIAL ULTRASOUND;  Surgeon: [name], DO;  Location: [place];  Service: Pulmonary;  Laterality: N/A;   Patient Active Problem List   Diagnosis Date Noted   DVT (deep venous thrombosis) ([place]) 09/22/2022   Acute respiratory failure with hypoxia (HCC) [id number]   Chronic systolic CHF (congestive heart failure) ([place]) 09/21/2022   Hypokalemia 09/21/2022   Pleural effusion, malignant 09/21/2022   Fall at home, initial encounter 09/21/2022   Paroxysmal atrial fibrillation with RVR ([place]) 09/21/2022   Hypothyroidism 09/21/2022   Ataxia 08/19/2022   Malignant neoplasm of lung ([place]) 05/29/2021   Acute pulmonary embolism ([place]) 05/04/2021   Non-small cell carcinoma of lung, stage 4, left ([place]) 06/19/2020   Goals of care, counseling/discussion 06/19/2020   S/P partial lobectomy of lung 05/22/2020   DCM (dilated cardiomyopathy) ([place]) 09/17/2015   OSA (obstructive sleep apnea) 07/12/2015   Bradycardia 08/29/2013   CAD (coronary artery disease), native coronary artery    RBBB    Essential hypertension, benign 08/18/2013   Hyperlipemia 08/18/2013    PCP: [name], MD  REFERRING PROVIDER: Nita Sells, MD   REFERRING DIAG: C34.90 (ICD-10-CM) - Malignant neoplasm of lung, unspecified laterality, unspecified part of lung ([place])   THERAPY DIAG:  Difficulty in walking, not elsewhere classified  Muscle weakness (generalized)  Malignant neoplasm of lung, unspecified laterality, unspecified part of lung ([place])  ONSET DATE: 03/20/22  Rationale for Evaluation and Treatment: Rehabilitation  SUBJECTIVE:                                                                                                                                                                                           SUBJECTIVE STATEMENT: Pt returns today after having been hospitalized recently from March 4- 8 for Acute pulmonary Embolism and Afib.  He  had been on Eliquis which was not working and has now been switched to Lovenox.  He had been in [place] and was experiencing SOB  and low O2 saturation levels  from the time he got into [place]. On Sunday when he returned home he was still having a lot of trouble breathing and went to the ED. I just got back from a PET scan and when I got up my back hurt but its better now. Prior to his hospitalization he notes being steadier on his feet, and his appetite was improved.   He was able to breathe a little better. Since his recent hospitalization he can walk a little. SOB is still there, but he can recover more quickly  PERTINENT HISTORY:  Stage 4 lung cancer with mets to bilateral adrenal glands and bone - T1, T10 (s/p lobectomy 2021, chemo and radiation). 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 Hx of CABG 2007 and pulmonary embolism 2022, partial lobectomy of lung 05/22/2020, also BPH, HTN and dilated cardiomyopathy  PAIN:  Are you having pain? No  PRECAUTIONS: Bony mets  WEIGHT BEARING RESTRICTIONS: No  FALLS:  Has patient fallen in last 6 months? 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  LEISURE: pt reports he has not been able to exercise   HAND DOMINANCE: right   PRIOR LEVEL OF FUNCTION: Independent  PATIENT GOALS: to be able to walk on his own on decrease caregiver burden   OBJECTIVE:  COGNITION: Overall cognitive status: Within functional limits for tasks assessed   OBSERVATIONS / OTHER ASSESSMENTS: some increased shortness of breath with exercise followed by good recovery after seated recovery  period  POSTURE: forward head, rounded shoulders   UPPER EXTREMITY STRENGTH: 5/5   LOWER EXTREMITY MMT:  STRENGTH Right eval  Hip flexion 4/5  Hip extension 4/5  Hip abduction 4+/5  Hip adduction   Hip internal rotation   Hip external rotation   Knee flexion 3+/5  Knee extension 5/5  Ankle dorsiflexion 4+/5  Ankle plantarflexion   Ankle inversion   Ankle eversion    (Blank rows = not tested)  A/PROM LEFT eval  Hip flexion 4/5  Hip extension 4/5  Hip abduction 4/5  Hip adduction   Hip internal rotation   Hip external rotation   Knee flexion 3+/5  Knee extension 5/5  Ankle dorsiflexion 4+/5  Ankle plantarflexion   Ankle inversion   Ankle eversion    (Blank rows = not tested)  FUNCTIONAL TESTS:  30 seconds chair stand test 10 reps Vitals prior: 96% and 66 bpm, then post 95% and 89 bpm  GAIT: Distance walked: 200 feet  Assistive device utilized: None Level of assistance: SBA  Comments: decreased trunk rotation, comes very close to obstacles and veers to the side if he is not looking straight ahead  BALANCE: L SLS - unable to maintain balance R SLS - 15 sec   TODAY'S TREATMENT:  DATE:  09/28/2023 O2 96, HR 79 SLS left 9, 12     Right 7, 9 30 sec sit to stand 8 reps, O2 98, pulse 95 Ambulated 2 x 125 feet , 1st 125 O2 98 HR 95 seated rest, second 123 O2 98, HR 98 Nu Step seat 10, UE 9, Lev 1 x 2.5 min, O2 94%, HR 112, HR decreased quickly another 2.5 mins. O2 98, HR 95 improves quickly. Reviewed goals with pt and updated. 09/03/2022 98, HR 65 Nu step level 3, seat 10, UE 9 x 3 min 97%, 78 BPM  then at 10 min O2 was 95 and HR was 82 Hamstring stretch x 20 sec bilaterally x 2 Stepping over hurdles with step through gait and HHA on bars x 4 then side stepping over hurdles with step to and occasional HHA O2 95% HR 116 bpm -  stopped for seated recovery period due to increased HR - quickly recovered to 90 bpm after about 2 min  Marching x20 no HH 97% O2 but pt with increased shortness of breath and HR 113 Heel raises x 20 - O2 99%, HR 87  Standing SLR flexion with no HHA 97 O2, 76 bpm, abd with occasional HHA O2 96, HR 76, ext with HHA x 10, 97%, 78bpm Ambulation 351 feet, 02 97%, HR 136 half way through then had pt take a seated recovery period due to high heart rate and it quickly decreased to 88 bpm   09/07/2022 98, HR 65 Nu step level 3, seat 10, UE 9 x 3 min 94%, 99 BPM at 3 min 30 sec then x 2 min 30 sec was at 92, 83 BPM recovered to 95% and 76 bpm in less than 2 min then continued with O2 at 96% until pt reached 10 min  Hamstring stretch x 20 sec bilaterally x 2 Marching 2 x 10 no HH 94%O2 after first set then recovery period and then another set Heel raises 2 x 10 - O2 93%, HR 76 but quickly recovered to 96% within 1 min Sidestepping 4 lengths of bar 96%, 84 BPM Standing SLR flexion with no HHA v/c to keep knee straight, abd with no HHA, ext with no HHA x 10, 94%, 88bpm Tandem stance bilaterally, held for count of 30 sec with no HHA and no loss of balance  Ambulation 324 feet, 02 90%, HR 90 but quickly to 95% and 72bpm after 1 min then another 324 feet O2 dropped to 89 half way through so took a standing recovery period then it increased to 93% then at the end was still at 93 and 77 bpm  09/03/2022 98, HR 65 Nu step level 2, seat 10, UE 9 x 3 min 96%, 85 BPM at 3 min then x 3 more min 94, 83 BPM recovered to 97% and 73bpm Marching 2 x 10 no HH 94%O2 after first set then recovery period and then another set, heel raises 2 x 10 Sidestepping 4 lengths of bar 96%, 75 BPM Standing SLR flexion with no HHA, abd with finger touch assist, ext with only finger tip touching bar x 10, 95%, 92bpm Sit to stand 2 x 5 6 in step ups 10 reps no HH,, O2 94%, HR 97 recovered quickly Tandem stance bilaterally, held for count  of 30 sec with occasional finger tip touch for balance SLS x 3 B 4-8 sec, practiced how to wt. Shift properly to unwt the opposite side. Ambulation 600 feet, 02 90%, HR 90  but quickly to 95% and 72bpm after 2 min  08/31/2022 98, HR 65 Nu step level 2, seat 10, UE 9 x 3 min 93%, 75 BPM recovered quickly x 2 more min 97, 71 BPM Marching 2 x 10 no HH, heel raises 2 x 10 Sidestepping 4 lengths of bar 96%, 75 BPM Standing with HH,SLR flexion, abd ext x 10, 93%, 72 Sit to stand 2 x 5 6 in step ups 1 x 10 B no HH,, O2 94%, HR 97 recovered quickly  HR 79 Tandem stance bilaterally, held for count of 30 sec SLS x 3 B 4-8 sec, practiced how to wt. Shift properly to unwt the opposite side. Ambulation 485 feet, 02 90%, HR   08/27/22- none today due to time constraints  PATIENT EDUCATION:  Education details: pacing self especially when feeling short of breath Person educated: Patient Education method: Explanation Education comprehension: verbalized understanding  HOME EXERCISE PROGRAM: Access Code: [email or web address] URL: [email or web address] Date: 09/03/2022 Prepared by: Manus Gunning  Exercises - Sit to Stand Without Arm Support  - 1 x daily - 7 x weekly - 2 sets - 5 reps - Standing March with Counter Support  - 1 x daily - 7 x weekly - 1 sets - 10 reps - Standing Hip Abduction with Counter Support  - 1 x daily - 7 x weekly - 1 sets - 10 reps - Standing Hip Extension with Counter Support  - 1 x daily - 7 x weekly - 1 sets - 10 reps - Heel Raises with Counter Support  - 1 x daily - 7 x weekly - 1-2 sets - 10 reps - Standing Tandem Balance with Counter Support  - 1 x daily - 7 x weekly - 1 sets - 10 reps  ASSESSMENT:  CLINICAL IMPRESSION:  Pt was recently hospitalized from March 4-8th for an Acute pulmonary PE and Afib on his return from [place] where he experienced O2 Sats around 84 and significant trouble breathing. He is now on Lovenox and is feeling better, but continues  to suffer from SOB. Goals were assessed today. He was able to ambulate 125 feet x2 with 1 short rest break without any veering to the side. We did not try 500 ft as he had recently come home from the hospital.  He continues to have decreased bilateral SLS balance, but had shown improvement on the left. He reported that he is able to go up and down 5 stairs at home without a rail, but this was not observed in clinic. He was doing a HEP but had not yet resumed due to his hospitalization. He has been walking some in the house. He will benefit from continuation of skilled PT to address deficits and to improve function.  OBJECTIVE IMPAIRMENTS: Abnormal gait, decreased balance, difficulty walking, decreased strength, and postural dysfunction.   ACTIVITY LIMITATIONS: stairs and ambulation  PARTICIPATION LIMITATIONS: driving and community activity  PERSONAL FACTORS: Time since onset of injury/illness/exacerbation and 1 comorbidity: bony mets  are also affecting patient's functional outcome.   REHAB POTENTIAL: Good  CLINICAL DECISION MAKING: Stable/uncomplicated  EVALUATION COMPLEXITY: Low  GOALS: Goals reviewed with patient? Yes  SHORT TERM GOALS = LONG TERM GOALS: Target date: 09/24/22  Pt will demonstrate 5/5 bilateral hamstring strength to decrease risk of falls.  Baseline:  Goal status: INITIAL  2.  Pt will be able to ambulate for 500 feet independently without veering to the side.  Baseline:  Goal status: in Progress, able to ambulate 125  ft x 2 without veering today(09/28/2022)  3.  Pt will be able to perform SLS on L LE for 15 sec to decrease fall risk.  Baseline:  Goal status: in Progress 4.  Pt will be independent in a home exercise program for continued stretching and strengthening.  Baseline:  Goal status: MET before hospitalization, In progress since hospitalization  5.  Pt will be able to ascend/descend 5 steps with no handrail safely and independently to decrease fall risk at  home.  Baseline:  Goal status: MET on home stairs per pt report PLAN:  PT FREQUENCY: 2x/week  PT DURATION: 6 weeks  PLANNED INTERVENTIONS: Therapeutic exercises, Therapeutic activity, Neuromuscular re-education, Balance training, Gait training, Patient/Family education, Self Care, Joint mobilization, Manual therapy, and Re-evaluation  PLAN FOR NEXT SESSION: pt released from hospital 09/25/2022, Monitor O2 and HR, **bony mets** NuStep while monitoring HR/O2, stair ambulation, gait training, high level balance activities, hamstring strengthening   Claris Pong, PT 09/28/2022, 2:51 PM

## 2022-09-29 ENCOUNTER — Other Ambulatory Visit: Payer: Self-pay

## 2022-09-29 ENCOUNTER — Encounter: Payer: Self-pay | Admitting: *Deleted

## 2022-09-29 DIAGNOSIS — C3492 Malignant neoplasm of unspecified part of left bronchus or lung: Secondary | ICD-10-CM

## 2022-09-29 NOTE — Progress Notes (Signed)
Dr.Ennever spoke with patient via phone call to go over recent PET scan results, patient agreed to hospice. Hospice referral placed to [facility] hospice per wife request.

## 2022-09-29 NOTE — Progress Notes (Signed)
Reviewed patient PET which shows progression. Patient was recently hospitalized and has decided to forgo further chemo/treatment. He will be enrolled in hospice. As such will discontinue active navigation.   Oncology Nurse Navigator Documentation     09/29/2022    2:15 PM  Oncology Nurse Navigator Flowsheets  Navigation Complete Date: 09/29/2022  Post Navigation: Continue to Follow Patient? No  Reason Not Navigating Patient: Airline pilot Encounter Type Scan Review  Patient Visit Type MedOnc  Treatment Phase Other  Barriers/Navigation Needs Coordination of Care  Interventions None Required  Acuity Level 2-Minimal Needs (1-2 Barriers Identified)  Support Groups/Services Friends and Family  Time Spent with Patient 15

## 2022-09-30 ENCOUNTER — Ambulatory Visit: Payer: Medicare Other

## 2022-09-30 DIAGNOSIS — R262 Difficulty in walking, not elsewhere classified: Secondary | ICD-10-CM | POA: Diagnosis not present

## 2022-09-30 DIAGNOSIS — M6281 Muscle weakness (generalized): Secondary | ICD-10-CM | POA: Diagnosis not present

## 2022-09-30 DIAGNOSIS — C3490 Malignant neoplasm of unspecified part of unspecified bronchus or lung: Secondary | ICD-10-CM

## 2022-09-30 NOTE — Therapy (Signed)
OUTPATIENT PHYSICAL THERAPY ONCOLOGY TREATMENT  Patient Name: John Hinton MRN: [id number] DOB:26-Jul-1945, 77 y.o., male Today's Date: 09/30/2022  END OF SESSION:  PT End of Session - 09/30/22 1358     Visit Number 7    Number of Visits 18    Date for PT Re-Evaluation 11/09/22    PT Start Time 1400    PT Stop Time 1448    PT Time Calculation (min) 48 min    Equipment Utilized During Treatment Gait belt    Activity Tolerance Patient tolerated treatment well    Behavior During Therapy WFL for tasks assessed/performed              Past Medical History:  Diagnosis Date   BPH (benign prostatic hypertrophy)    Bradycardia    a. H/o asymptomatic bradycardia.   Coronary artery disease    a. s/p CABG ~2007 with LIMA to LAD, SVG seq to OM1 and OM2, SVG to PDA and SVG to diag. b. Abnormal nuc 03/2015 - s/p DES to SVG-OM1-OM2. 01/29/17 POBA with cutting balloon to SVG-->OM.  No ischemia Lexi myoview 2021   DCM (dilated cardiomyopathy) ([place]) 09/17/2015   mixed by cMRI 03/2021 with EF 39% with non viable infarct in basal to mid lateral wall and LGE pattern c/w nonischemic component as well. Mild RV dysfunction RVEF 42%.   Dyslipidemia    Dyspnea    r/t lung mass   ED (erectile dysfunction)    Fear of heights    Goals of care, counseling/discussion 06/19/2020   History of radiation therapy    T spine and left ilium 10/07/2021-10/20/2021  Dr Gery Pray   Hypertension    Non-small cell carcinoma of lung, stage 4, left ([place]) 06/19/2020   OSA (obstructive sleep apnea) 07/12/2015   Moderate OSA with AHI 16.5/hr now on CPAP at 8cm H2O, uses cpap nightly      RBBB    Shoulder, capsulitis, adhesive    Left Shoulder   Past Surgical History:  Procedure Laterality Date   APPENDECTOMY     BRONCHIAL BIOPSY  05/09/2020   Procedure: BRONCHIAL BIOPSIES;  Surgeon: [name], DO;  Location: [facility];  Service: Pulmonary;;   BRONCHIAL BRUSHINGS  05/09/2020   Procedure: BRONCHIAL  BRUSHINGS;  Surgeon: [name], DO;  Location: [facility];  Service: Pulmonary;;   BRONCHIAL NEEDLE ASPIRATION BIOPSY  05/09/2020   Procedure: BRONCHIAL NEEDLE ASPIRATION BIOPSIES;  Surgeon: [name], DO;  Location: [facility];  Service: Pulmonary;;   BRONCHIAL WASHINGS  05/09/2020   Procedure: BRONCHIAL WASHINGS;  Surgeon: [name], DO;  Location: [facility];  Service: Pulmonary;;   CARDIAC CATHETERIZATION N/A 04/05/2015   Procedure: Left Heart Cath and Coronary Angiography;  Surgeon: Sherren Mocha, MD;  Location: [facility];  Service: Cardiovascular;  Laterality: N/A;   COLONOSCOPY     CORONARY ARTERY BYPASS GRAFT     w/LIMA to LAD, seq SVG to OM1 and OM2, SVG to PDA and SVG to Diagonal   CORONARY BALLOON ANGIOPLASTY N/A 01/29/2017   Procedure: Coronary Balloon Angioplasty;  Surgeon: Martinique, Peter M, MD;  Location: [facility];  Service: Cardiovascular;  Laterality: N/A;   FIDUCIAL MARKER PLACEMENT  05/09/2020   Procedure: FIDUCIAL MARKER PLACEMENT;  Surgeon: [name], DO;  Location: [facility];  Service: Pulmonary;;   INTERCOSTAL NERVE BLOCK Left 05/22/2020   Procedure: INTERCOSTAL NERVE BLOCK;  Surgeon: Lajuana Matte, MD;  Location: [facility];  Service: Thoracic;  Laterality:  Left;   LEFT HEART CATH AND CORS/GRAFTS ANGIOGRAPHY N/A 01/29/2017   Procedure: Left Heart Cath and Cors/Grafts Angiography;  Surgeon: Martinique, Peter M, MD;  Location: [place] [facility];  Service: Cardiovascular;  Laterality: N/A;   NODE DISSECTION N/A 05/22/2020   Procedure: NODE DISSECTION;  Surgeon: Lajuana Matte, MD;  Location: [facility];  Service: Thoracic;  Laterality: N/A;   VIDEO BRONCHOSCOPY WITH ENDOBRONCHIAL NAVIGATION N/A 05/09/2020   Procedure: VIDEO BRONCHOSCOPY WITH ENDOBRONCHIAL NAVIGATION;  Surgeon: [name], DO;  Location: [facility];  Service: Pulmonary;  Laterality: N/A;   VIDEO BRONCHOSCOPY WITH ENDOBRONCHIAL ULTRASOUND N/A  05/09/2020   Procedure: VIDEO BRONCHOSCOPY WITH ENDOBRONCHIAL ULTRASOUND;  Surgeon: [name], DO;  Location: [facility];  Service: Pulmonary;  Laterality: N/A;   Patient Active Problem List   Diagnosis Date Noted   DVT (deep venous thrombosis) ([place]) 09/22/2022   Acute respiratory failure with hypoxia (H[facility]) [id number]   Chronic systolic CHF (congestive heart failure) ([place]) 09/21/2022   Hypokalemia 09/21/2022   Pleural effusion, malignant 09/21/2022   Fall at home, initial encounter 09/21/2022   Paroxysmal atrial fibrillation with RVR ([place]) 09/21/2022   Hypothyroidism 09/21/2022   Ataxia 08/19/2022   Malignant neoplasm of lung ([place]) 05/29/2021   Acute pulmonary embolism ([place]) 05/04/2021   Non-small cell carcinoma of lung, stage 4, left ([place]) 06/19/2020   Goals of care, counseling/discussion 06/19/2020   S/P partial lobectomy of lung 05/22/2020   DCM (dilated cardiomyopathy) ([place]) 09/17/2015   OSA (obstructive sleep apnea) 07/12/2015   Bradycardia 08/29/2013   CAD (coronary artery disease), native coronary artery    RBBB    Essential hypertension, benign 08/18/2013   Hyperlipemia 08/18/2013    PCP: [name], MD  REFERRING PROVIDER: Nita Sells, MD   REFERRING DIAG: C34.90 (ICD-10-CM) - Malignant neoplasm of lung, unspecified laterality, unspecified part of lung ([place])   THERAPY DIAG:  Difficulty in walking, not elsewhere classified  Muscle weakness (generalized)  Malignant neoplasm of lung, unspecified laterality, unspecified part of lung ([place])  ONSET DATE: 03/20/22  Rationale for Evaluation and Treatment: Rehabilitation  SUBJECTIVE:                                                                                                                                                                                           SUBJECTIVE STATEMENT: Woke up this am with my back hurting.  My O2 is fine, but I feel like I am breathing harder today. I did have to lay  down and rest after last visit but what we did was appropriate last time. PERTINENT HISTORY:  Stage 4 lung cancer with mets to  bilateral adrenal glands and bone - T1, T10 (s/p lobectomy 2021, chemo and radiation). 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 Hx of CABG 2007 and pulmonary embolism 2022, partial lobectomy of lung 05/22/2020, also BPH, HTN and dilated cardiomyopathy  PAIN:  Are you having pain? Yes  PAIN:  Are you having pain? Yes NPRS scale: 6-7/10 Pain location: lower back greater than upper back Pain orientation: Lower  PAIN TYPE: aching Pain description: constant  Aggravating factors: worse when he got up this am, Relieving factors: hasn't taken anything   PRECAUTIONS: Bony mets  WEIGHT BEARING RESTRICTIONS: No  FALLS:  Has patient fallen in last 6 months? 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  LEISURE: pt reports he has not been able to exercise   HAND DOMINANCE: right   PRIOR LEVEL OF FUNCTION: Independent  PATIENT GOALS: to be able to walk on his own on decrease caregiver burden   OBJECTIVE:  COGNITION: Overall cognitive status: Within functional limits for tasks assessed   OBSERVATIONS / OTHER ASSESSMENTS: some increased shortness of breath with exercise followed by good recovery after seated recovery period  POSTURE: forward head, rounded shoulders   UPPER EXTREMITY STRENGTH: 5/5   LOWER EXTREMITY MMT:  STRENGTH Right eval  Hip flexion 4/5  Hip extension 4/5  Hip abduction 4+/5  Hip adduction   Hip internal rotation   Hip external rotation   Knee flexion 3+/5   Knee extension 5/5  Ankle dorsiflexion 4+/5  Ankle plantarflexion   Ankle inversion   Ankle eversion    (Blank rows = not tested)  A/PROM LEFT eval  Hip flexion 4/5  Hip extension 4/5  Hip abduction 4/5  Hip adduction   Hip internal rotation   Hip external rotation   Knee flexion 3+/5  Knee extension 5/5  Ankle dorsiflexion 4+/5  Ankle plantarflexion   Ankle inversion   Ankle eversion    (Blank rows = not tested)  FUNCTIONAL TESTS:  30 seconds chair stand test 10 reps Vitals prior: 96% and 66 bpm, then post 95% and 89 bpm  GAIT: Distance walked: 200 feet  Assistive device utilized: None Level of assistance: SBA  Comments: decreased trunk rotation, comes very close to obstacles and veers to the side if he is not looking straight ahead  BALANCE: L SLS - unable to maintain balance R SLS - 15 sec   TODAY'S TREATMENT:  DATE:   09/30/2022 02 96%, HR 84 Nu step seat 10, Ue 9, lev 1  x 5 min, o2 94, HR 81 Heel raises x 10, marching on floor x 10, on ax pad x 10,step ups on pad x 10 ea 91%, 100 BPM Incline stretch 3 x 15 sec Mini squats holding bars  O2 85 recovered quickly Ambulated 113 feet  seated rest    160 ft o2 90, 106 BPM Airex beam forward 4 lengths, sideways 4 lengths light touch on bars Ambulated 275 feet without rest, O2 97, HR 105 SLS x 2 Bilaterally; best 30 , least 9 seconds   09/28/2023 O2 96, HR 79 SLS left 9, 12     Right 7, 9 30 sec sit to stand 8 reps, O2 98, pulse 95 Ambulated 2 x 125 feet , 1st 125 O2 98 HR 95 seated rest, second 123 O2 98, HR 98 Nu Step seat 10, UE 9, Lev 1 x 2.5 min, O2 94%, HR 112, HR decreased quickly another 2.5 mins. O2 98, HR 95 improves quickly. Reviewed goals with pt and updated. 09/03/2022 98, HR 65 Nu step level 3, seat 10, UE 9 x 3 min 97%, 78 BPM  then at 10 min O2 was 95 and HR was  82 Hamstring stretch x 20 sec bilaterally x 2 Stepping over hurdles with step through gait and HHA on bars x 4 then side stepping over hurdles with step to and occasional HHA O2 95% HR 116 bpm - stopped for seated recovery period due to increased HR - quickly recovered to 90 bpm after about 2 min  Marching x20 no HH 97% O2 but pt with increased shortness of breath and HR 113 Heel raises x 20 - O2 99%, HR 87  Standing SLR flexion with no HHA 97 O2, 76 bpm, abd with occasional HHA O2 96, HR 76, ext with HHA x 10, 97%, 78bpm Ambulation 351 feet, 02 97%, HR 136 half way through then had pt take a seated recovery period due to high heart rate and it quickly decreased to 88 bpm   09/07/2022 98, HR 65 Nu step level 3, seat 10, UE 9 x 3 min 94%, 99 BPM at 3 min 30 sec then x 2 min 30 sec was at 92, 83 BPM recovered to 95% and 76 bpm in less than 2 min then continued with O2 at 96% until pt reached 10 min  Hamstring stretch x 20 sec bilaterally x 2 Marching 2 x 10 no HH 94%O2 after first set then recovery period and then another set Heel raises 2 x 10 - O2 93%, HR 76 but quickly recovered to 96% within 1 min Sidestepping 4 lengths of bar 96%, 84 BPM Standing SLR flexion with no HHA v/c to keep knee straight, abd with no HHA, ext with no HHA x 10, 94%, 88bpm Tandem stance bilaterally, held for count of 30 sec with no HHA and no loss of balance  Ambulation 324 feet, 02 90%, HR 90 but quickly to 95% and 72bpm after 1 min then another 324 feet O2 dropped to 89 half way through so took a standing recovery period then it increased to 93% then at the end was still at 93 and 77 bpm  09/03/2022 98, HR 65 Nu step level 2, seat 10, UE 9 x 3 min 96%, 85 BPM at 3 min then x 3 more min 94, 83 BPM recovered to 97% and 73bpm Marching 2 x 10 no  HH 94%O2 after first set then recovery period and then another set, heel raises 2 x 10 Sidestepping 4 lengths of bar 96%, 75 BPM Standing SLR flexion with no HHA, abd with finger  touch assist, ext with only finger tip touching bar x 10, 95%, 92bpm Sit to stand 2 x 5 6 in step ups 10 reps no HH,, O2 94%, HR 97 recovered quickly Tandem stance bilaterally, held for count of 30 sec with occasional finger tip touch for balance SLS x 3 B 4-8 sec, practiced how to wt. Shift properly to unwt the opposite side. Ambulation 600 feet, 02 90%, HR 90 but quickly to 95% and 72bpm after 2 min  08/31/2022 98, HR 65 Nu step level 2, seat 10, UE 9 x 3 min 93%, 75 BPM recovered quickly x 2 more min 97, 71 BPM Marching 2 x 10 no HH, heel raises 2 x 10 Sidestepping 4 lengths of bar 96%, 75 BPM Standing with HH,SLR flexion, abd ext x 10, 93%, 72 Sit to stand 2 x 5 6 in step ups 1 x 10 B no HH,, O2 94%, HR 97 recovered quickly  HR 79 Tandem stance bilaterally, held for count of 30 sec SLS x 3 B 4-8 sec, practiced how to wt. Shift properly to unwt the opposite side. Ambulation 485 feet, 02 90%, HR   08/27/22- none today due to time constraints  PATIENT EDUCATION:  Education details: pacing self especially when feeling short of breath Person educated: Patient Education method: Explanation Education comprehension: verbalized understanding  HOME EXERCISE PROGRAM: Access Code: [email or web address] URL: [email or web address] Date: 09/03/2022 Prepared by: Manus Gunning  Exercises - Sit to Stand Without Arm Support  - 1 x daily - 7 x weekly - 2 sets - 5 reps - Standing March with Counter Support  - 1 x daily - 7 x weekly - 1 sets - 10 reps - Standing Hip Abduction with Counter Support  - 1 x daily - 7 x weekly - 1 sets - 10 reps - Standing Hip Extension with Counter Support  - 1 x daily - 7 x weekly - 1 sets - 10 reps - Heel Raises with Counter Support  - 1 x daily - 7 x weekly - 1-2 sets - 10 reps - Standing Tandem Balance with Counter Support  - 1 x daily - 7 x weekly - 1 sets - 10 reps  ASSESSMENT:  CLINICAL IMPRESSION: Back pain was slightly improved at completion of  rx.  Pts O2 started off lower than normal, and initially didn't recover as quickly, but mid way through he started recovering more quickly and O2 sats remained higher.  OBJECTIVE IMPAIRMENTS: Abnormal gait, decreased balance, difficulty walking, decreased strength, and postural dysfunction.   ACTIVITY LIMITATIONS: stairs and ambulation  PARTICIPATION LIMITATIONS: driving and community activity  PERSONAL FACTORS: Time since onset of injury/illness/exacerbation and 1 comorbidity: bony mets  are also affecting patient's functional outcome.   REHAB POTENTIAL: Good  CLINICAL DECISION MAKING: Stable/uncomplicated  EVALUATION COMPLEXITY: Low  GOALS: Goals reviewed with patient? Yes  SHORT TERM GOALS = LONG TERM GOALS: Target date: 09/24/22  Pt will demonstrate 5/5 bilateral hamstring strength to decrease risk of falls.  Baseline:  Goal status: INITIAL  2.  Pt will be able to ambulate for 500 feet independently without veering to the side.  Baseline:  Goal status: in Progress, able to ambulate 125 ft x 2 without veering today(09/28/2022)  3.  Pt will be able to perform SLS on  L LE for 15 sec to decrease fall risk.  Baseline:  Goal status: in Progress 4.  Pt will be independent in a home exercise program for continued stretching and strengthening.  Baseline:  Goal status: MET before hospitalization, In progress since hospitalization  5.  Pt will be able to ascend/descend 5 steps with no handrail safely and independently to decrease fall risk at home.  Baseline:  Goal status: MET on home stairs per pt report PLAN:  PT FREQUENCY: 2x/week  PT DURATION: 6 weeks  PLANNED INTERVENTIONS: Therapeutic exercises, Therapeutic activity, Neuromuscular re-education, Balance training, Gait training, Patient/Family education, Self Care, Joint mobilization, Manual therapy, and Re-evaluation  PLAN FOR NEXT SESSION: pt released from hospital 09/25/2022, Monitor O2 and HR, **bony mets** NuStep while  monitoring HR/O2, stair ambulation, gait training, high level balance activities, hamstring strengthening   Claris Pong, PT 09/30/2022, 2:51 PM

## 2022-10-02 DIAGNOSIS — I4891 Unspecified atrial fibrillation: Secondary | ICD-10-CM | POA: Diagnosis not present

## 2022-10-02 DIAGNOSIS — I509 Heart failure, unspecified: Secondary | ICD-10-CM | POA: Diagnosis not present

## 2022-10-02 DIAGNOSIS — C7951 Secondary malignant neoplasm of bone: Secondary | ICD-10-CM | POA: Diagnosis not present

## 2022-10-02 DIAGNOSIS — Z9225 Personal history of immunosupression therapy: Secondary | ICD-10-CM | POA: Diagnosis not present

## 2022-10-02 DIAGNOSIS — N400 Enlarged prostate without lower urinary tract symptoms: Secondary | ICD-10-CM | POA: Diagnosis not present

## 2022-10-02 DIAGNOSIS — I4510 Unspecified right bundle-branch block: Secondary | ICD-10-CM | POA: Diagnosis not present

## 2022-10-02 DIAGNOSIS — E785 Hyperlipidemia, unspecified: Secondary | ICD-10-CM | POA: Diagnosis not present

## 2022-10-02 DIAGNOSIS — C7801 Secondary malignant neoplasm of right lung: Secondary | ICD-10-CM | POA: Diagnosis not present

## 2022-10-02 DIAGNOSIS — Z87891 Personal history of nicotine dependence: Secondary | ICD-10-CM | POA: Diagnosis not present

## 2022-10-02 DIAGNOSIS — C3492 Malignant neoplasm of unspecified part of left bronchus or lung: Secondary | ICD-10-CM | POA: Diagnosis not present

## 2022-10-02 DIAGNOSIS — I25119 Atherosclerotic heart disease of native coronary artery with unspecified angina pectoris: Secondary | ICD-10-CM | POA: Diagnosis not present

## 2022-10-02 DIAGNOSIS — E8809 Other disorders of plasma-protein metabolism, not elsewhere classified: Secondary | ICD-10-CM | POA: Diagnosis not present

## 2022-10-02 DIAGNOSIS — I82409 Acute embolism and thrombosis of unspecified deep veins of unspecified lower extremity: Secondary | ICD-10-CM | POA: Diagnosis not present

## 2022-10-02 DIAGNOSIS — I110 Hypertensive heart disease with heart failure: Secondary | ICD-10-CM | POA: Diagnosis not present

## 2022-10-02 DIAGNOSIS — I2699 Other pulmonary embolism without acute cor pulmonale: Secondary | ICD-10-CM | POA: Diagnosis not present

## 2022-10-02 DIAGNOSIS — E039 Hypothyroidism, unspecified: Secondary | ICD-10-CM | POA: Diagnosis not present

## 2022-10-02 DIAGNOSIS — C7970 Secondary malignant neoplasm of unspecified adrenal gland: Secondary | ICD-10-CM | POA: Diagnosis not present

## 2022-10-05 DIAGNOSIS — I25119 Atherosclerotic heart disease of native coronary artery with unspecified angina pectoris: Secondary | ICD-10-CM | POA: Diagnosis not present

## 2022-10-05 DIAGNOSIS — Z9225 Personal history of immunosupression therapy: Secondary | ICD-10-CM | POA: Diagnosis not present

## 2022-10-05 DIAGNOSIS — C7970 Secondary malignant neoplasm of unspecified adrenal gland: Secondary | ICD-10-CM | POA: Diagnosis not present

## 2022-10-05 DIAGNOSIS — C3492 Malignant neoplasm of unspecified part of left bronchus or lung: Secondary | ICD-10-CM | POA: Diagnosis not present

## 2022-10-05 DIAGNOSIS — C7951 Secondary malignant neoplasm of bone: Secondary | ICD-10-CM | POA: Diagnosis not present

## 2022-10-05 DIAGNOSIS — C7801 Secondary malignant neoplasm of right lung: Secondary | ICD-10-CM | POA: Diagnosis not present

## 2022-10-06 ENCOUNTER — Other Ambulatory Visit: Payer: Self-pay

## 2022-10-06 ENCOUNTER — Encounter: Payer: Self-pay | Admitting: Hematology & Oncology

## 2022-10-06 ENCOUNTER — Ambulatory Visit: Payer: Medicare Other | Admitting: Physical Therapy

## 2022-10-06 ENCOUNTER — Inpatient Hospital Stay: Attending: Hematology & Oncology

## 2022-10-06 ENCOUNTER — Inpatient Hospital Stay (HOSPITAL_BASED_OUTPATIENT_CLINIC_OR_DEPARTMENT_OTHER): Admitting: Hematology & Oncology

## 2022-10-06 ENCOUNTER — Ambulatory Visit: Payer: Medicare Other | Admitting: Occupational Therapy

## 2022-10-06 ENCOUNTER — Inpatient Hospital Stay

## 2022-10-06 VITALS — BP 123/87 | HR 82 | Temp 97.4°F | Resp 20 | Ht 71.0 in | Wt 176.1 lb

## 2022-10-06 DIAGNOSIS — C3492 Malignant neoplasm of unspecified part of left bronchus or lung: Secondary | ICD-10-CM | POA: Diagnosis not present

## 2022-10-06 DIAGNOSIS — Z7901 Long term (current) use of anticoagulants: Secondary | ICD-10-CM | POA: Diagnosis not present

## 2022-10-06 DIAGNOSIS — W19XXXA Unspecified fall, initial encounter: Secondary | ICD-10-CM | POA: Diagnosis not present

## 2022-10-06 DIAGNOSIS — R410 Disorientation, unspecified: Secondary | ICD-10-CM | POA: Insufficient documentation

## 2022-10-06 DIAGNOSIS — C7971 Secondary malignant neoplasm of right adrenal gland: Secondary | ICD-10-CM | POA: Insufficient documentation

## 2022-10-06 DIAGNOSIS — J90 Pleural effusion, not elsewhere classified: Secondary | ICD-10-CM | POA: Diagnosis not present

## 2022-10-06 DIAGNOSIS — R634 Abnormal weight loss: Secondary | ICD-10-CM | POA: Diagnosis not present

## 2022-10-06 DIAGNOSIS — S32019A Unspecified fracture of first lumbar vertebra, initial encounter for closed fracture: Secondary | ICD-10-CM | POA: Insufficient documentation

## 2022-10-06 DIAGNOSIS — C7951 Secondary malignant neoplasm of bone: Secondary | ICD-10-CM | POA: Diagnosis not present

## 2022-10-06 DIAGNOSIS — C3412 Malignant neoplasm of upper lobe, left bronchus or lung: Secondary | ICD-10-CM | POA: Diagnosis present

## 2022-10-06 DIAGNOSIS — R7303 Prediabetes: Secondary | ICD-10-CM

## 2022-10-06 DIAGNOSIS — I2699 Other pulmonary embolism without acute cor pulmonale: Secondary | ICD-10-CM | POA: Diagnosis not present

## 2022-10-06 DIAGNOSIS — S300XXA Contusion of lower back and pelvis, initial encounter: Secondary | ICD-10-CM | POA: Diagnosis not present

## 2022-10-06 DIAGNOSIS — Z79899 Other long term (current) drug therapy: Secondary | ICD-10-CM | POA: Diagnosis not present

## 2022-10-06 DIAGNOSIS — C7972 Secondary malignant neoplasm of left adrenal gland: Secondary | ICD-10-CM | POA: Insufficient documentation

## 2022-10-06 LAB — VITAMIN B12: Vitamin B-12: 982 pg/mL — ABNORMAL HIGH (ref 180–914)

## 2022-10-06 LAB — CMP (CANCER CENTER ONLY)
ALT: 22 U/L (ref 0–44)
AST: 15 U/L (ref 15–41)
Albumin: 4.7 g/dL (ref 3.5–5.0)
Alkaline Phosphatase: 72 U/L (ref 38–126)
Anion gap: 11 (ref 5–15)
BUN: 26 mg/dL — ABNORMAL HIGH (ref 8–23)
CO2: 27 mmol/L (ref 22–32)
Calcium: 9.9 mg/dL (ref 8.9–10.3)
Chloride: 102 mmol/L (ref 98–111)
Creatinine: 1.38 mg/dL — ABNORMAL HIGH (ref 0.61–1.24)
GFR, Estimated: 54 mL/min — ABNORMAL LOW (ref >60)
Glucose, Bld: 123 mg/dL — ABNORMAL HIGH (ref 70–99)
Potassium: 4.3 mmol/L (ref 3.5–5.1)
Sodium: 140 mmol/L (ref 135–145)
Total Bilirubin: 0.4 mg/dL (ref 0.3–1.2)
Total Protein: 7.7 g/dL (ref 6.5–8.1)

## 2022-10-06 LAB — CBC WITH DIFFERENTIAL (CANCER CENTER ONLY)
Abs Immature Granulocytes: 0.17 10*3/uL — ABNORMAL HIGH (ref 0.00–0.07)
Basophils Absolute: 0 10*3/uL (ref 0.0–0.1)
Basophils Relative: 0 %
Eosinophils Absolute: 0.1 10*3/uL (ref 0.0–0.5)
Eosinophils Relative: 1 %
HCT: 37.8 % — ABNORMAL LOW (ref 39.0–52.0)
Hemoglobin: 12.2 g/dL — ABNORMAL LOW (ref 13.0–17.0)
Immature Granulocytes: 1 %
Lymphocytes Relative: 6 %
Lymphs Abs: 0.8 10*3/uL (ref 0.7–4.0)
MCH: 32 pg (ref 26.0–34.0)
MCHC: 32.3 g/dL (ref 30.0–36.0)
MCV: 99.2 fL (ref 80.0–100.0)
Monocytes Absolute: 0.6 10*3/uL (ref 0.1–1.0)
Monocytes Relative: 5 %
Neutro Abs: 10.7 10*3/uL — ABNORMAL HIGH (ref 1.7–7.7)
Neutrophils Relative %: 87 %
Platelet Count: 193 10*3/uL (ref 150–400)
RBC: 3.81 MIL/uL — ABNORMAL LOW (ref 4.22–5.81)
RDW: 15 % (ref 11.5–15.5)
WBC Count: 12.3 10*3/uL — ABNORMAL HIGH (ref 4.0–10.5)
nRBC: 0 % (ref 0.0–0.2)

## 2022-10-06 LAB — IRON AND IRON BINDING CAPACITY (CC-WL,HP ONLY)
Iron: 86 ug/dL (ref 45–182)
Saturation Ratios: 24 % (ref 17.9–39.5)
TIBC: 358 ug/dL (ref 250–450)
UIBC: 272 ug/dL (ref 117–376)

## 2022-10-06 LAB — TSH: TSH: 4.635 u[IU]/mL — ABNORMAL HIGH (ref 0.350–4.500)

## 2022-10-06 LAB — LACTATE DEHYDROGENASE: LDH: 355 U/L — ABNORMAL HIGH (ref 98–192)

## 2022-10-06 LAB — FERRITIN: Ferritin: 411 ng/mL — ABNORMAL HIGH (ref 24–336)

## 2022-10-06 MED ORDER — ENOXAPARIN SODIUM 120 MG/0.8ML IJ SOSY: 120.0000 mg | PREFILLED_SYRINGE | INTRAMUSCULAR | 4 refills | Status: DC

## 2022-10-06 NOTE — Progress Notes (Signed)
Hematology and Oncology Follow Up Visit  John Hinton TO:[id number] [birth year] 77 y.o. 10/06/2022   Principle Diagnosis:  Stage IVA (T3N2N0) adenocarcinoma of the LUL -- resected -- (+) KRAS -- progression - metastasis to right lung, bilateral adrenal glands, bony metastasis to T1, Y 10 and left iliac bone PE involving the right lower lobe and right middle lobe branches, no right heart strain noted/bilateral lower extremity DVTs   Past Therapy: S/P cycle 6 of Carbo/Alimta/Pembrolizumab Alimta/pembrolizumab-maintenance therapy s/p cycle 3/4  - start on 11/28/2020 --Alimta discontinued after cycle 2   Current Therapy:        Lumakras 360 mg po q day -- started on 07/31/2021 -- d/c on 02/07/203 for toxicity Xgeva every 3 months - due again 09/2022 Lovenox 120 mg sq q day -- start on 09/26/2022 Adagrasib  (Krazati)400 mg po BID -- changed on 07/25/2022 Nivolumab 480mg  IV q month -- start on 07/29/2022 --hold starting 08/17/2022 -    Interim History:  John Hinton is here today for follow-up.  He is now being followed by hospice.  He was hospitalized a couple weeks ago.  He had gone back from [place].  Had a really tough time up in [place].  He came back.  He had a pleural effusion.  He had a pulmonary embolism.  He had been on Eliquis.  We now have him on Lovenox.  We will try to get him on daily Lovenox.  Hospice is following him right now.  I know that they will do a good job with him.  He did have a PET scan done.  Unfortunately, and no surprise, the PET scan did show he has significant disease progression.  He has lost weight.  I told he and his wife that weight loss I think would be the determinant as to how well he does.  He walked in.  He seems to be intact neurologically.  He has been having some problems with confusion.  I am unsure if this was a paraneoplastic effect, even though his studies were all normal.  It may have been from his immunotherapy.  Again he is eating fairly  well.  He is having no nausea or vomiting.  He does have a large bruise on his lower back where he fell.  He did have some fractures at L1 and L2 at the transverse process from the fall.  There not appear to be any malignancy back there.  He has had no obvious change in bowel or bladder habits.  He has had no bleeding.  He has had no leg swelling.  Overall, I was his performance status is probably ECOG 1.  Medications:  Allergies as of 10/06/2022       Reactions   Amlodipine Swelling   Swelling of ankles        Medication List        Accurate as of October 06, 2022 12:39 PM. If you have any questions, ask your nurse or doctor.          STOP taking these medications    enoxaparin 80 MG/0.8ML injection Commonly known as: LOVENOX Replaced by: enoxaparin 120 MG/0.8ML injection Stopped by: [name], MD       TAKE these medications    aspirin 81 MG tablet Take 81 mg by mouth at bedtime.   B-12 PO Take 1,000 mcg by mouth daily.   colchicine 0.6 MG tablet Take 1 tablet (0.6 mg total) by mouth every 8 (eight) hours as needed.  diphenoxylate-atropine 2.5-0.025 MG tablet Commonly known as: LOMOTIL TAKE 2 TABLETS BY MOUTH AT ONSET OF DIARRHEA THEN TAKE 1 TABLET AFTER EACH LOOSE STOOL MAX 8 TABLETS PER DAY What changed:  how much to take how to take this when to take this reasons to take this   enoxaparin 120 MG/0.8ML injection Commonly known as: Lovenox Inject 0.8 mLs (120 mg total) into the skin daily. Replaces: enoxaparin 80 MG/0.8ML injection Started by: [name], MD   Entresto 24-26 MG Generic drug: sacubitril-valsartan TAKE ONE TABLET BY MOUTH TWICE A DAY   ezetimibe 10 MG tablet Commonly known as: ZETIA Take 1 tablet (10 mg total) by mouth daily.   folic acid 1 MG tablet Commonly known as: FOLVITE TAKE ONE TABLET BY MOUTH DAILY START 5 TO 7 DAYS PRIOR TO CHEMO AND CONTINUE UNTIL 21 DAYS AFTER COMPLETES   levothyroxine 50 MCG  tablet Commonly known as: Synthroid Take 1 tablet (50 mcg total) by mouth daily before breakfast.   multivitamin capsule Take 1 capsule by mouth daily.   nitroGLYCERIN 0.4 MG SL tablet Commonly known as: NITROSTAT Place 1 tablet (0.4 mg total) under the tongue every 5 (five) minutes as needed for chest pain.   pantoprazole 40 MG tablet Commonly known as: PROTONIX Take 1 tablet (40 mg total) by mouth daily.   predniSONE 10 MG tablet Commonly known as: DELTASONE Take 1 tablet (10 mg total) by mouth daily with breakfast.   rosuvastatin 40 MG tablet Commonly known as: CRESTOR TAKE ONE TABLET BY MOUTH EVERY NIGHT AT BEDTIME What changed: when to take this   sertraline 100 MG tablet Commonly known as: ZOLOFT Take 100 mg by mouth 2 (two) times daily.   traMADol 50 MG tablet Commonly known as: ULTRAM Take 1 tablet (50 mg total) by mouth every 6 (six) hours as needed.        Allergies:  Allergies  Allergen Reactions   Amlodipine Swelling    Swelling of ankles    Past Medical History, Surgical history, Social history, and Family History were reviewed and updated.  Review of Systems: Review of Systems  Constitutional: Negative.   HENT: Negative.    Eyes: Negative.   Respiratory: Negative.    Cardiovascular: Negative.   Gastrointestinal: Negative.   Genitourinary: Negative.   Musculoskeletal: Negative.   Skin: Negative.   Neurological: Negative.   Endo/Heme/Allergies: Negative.   Psychiatric/Behavioral: Negative.       Physical Exam:  height is 5\' 11"  (1.803 m) and weight is 176 lb 1.9 oz (79.9 kg). His oral temperature is 97.4 F (36.3 C) (abnormal). His blood pressure is 123/87 and his pulse is 82. His respiration is 20 and oxygen saturation is 100%.   Wt Readings from Last 3 Encounters:  10/06/22 176 lb 1.9 oz (79.9 kg)  09/21/22 182 lb 8.7 oz (82.8 kg)  09/08/22 189 lb (85.7 kg)    Physical Exam Vitals reviewed.  HENT:     Head: Normocephalic and  atraumatic.  Eyes:     Pupils: Pupils are equal, round, and reactive to light.  Cardiovascular:     Rate and Rhythm: Normal rate and regular rhythm.     Heart sounds: Normal heart sounds.  Pulmonary:     Effort: Pulmonary effort is normal.     Breath sounds: Normal breath sounds.  Abdominal:     General: Bowel sounds are normal.     Palpations: Abdomen is soft.  Musculoskeletal:        General: No  tenderness or deformity. Normal range of motion.     Cervical back: Normal range of motion.  Lymphadenopathy:     Cervical: No cervical adenopathy.  Skin:    General: Skin is warm and dry.     Findings: No erythema or rash.  Neurological:     Mental Status: He is alert and oriented to person, place, and time.     Comments: Neurological exam does show some slight neurological deficit with respect to cerebellar function.  Psychiatric:        Behavior: Behavior normal.        Thought Content: Thought content normal.        Judgment: Judgment normal.     Lab Results  Component Value Date   WBC 12.3 (H) 10/06/2022   HGB 12.2 (L) 10/06/2022   HCT 37.8 (L) 10/06/2022   MCV 99.2 10/06/2022   PLT 193 10/06/2022   Lab Results  Component Value Date   FERRITIN 981 (H) 09/21/2022   IRON 149 09/24/2022   TIBC 245 (L) 09/24/2022   UIBC 96 09/24/2022   IRONPCTSAT 61 (H) 09/24/2022   Lab Results  Component Value Date   RETICCTPCT 1.1 09/24/2022   RBC 3.81 (L) 10/06/2022   No results found for: "KPAFRELGTCHN", "LAMBDASER", "KAPLAMBRATIO" Lab Results  Component Value Date   IGGSERUM 535 (L) 08/21/2022   Lab Results  Component Value Date   TOTALPROTELP 5.8 (L) 08/19/2022   ALBUMINELP 3.4 08/19/2022   A1GS 0.3 08/19/2022   A2GS 0.8 08/19/2022   BETS 0.9 08/19/2022   GAMS 0.5 08/19/2022   MSPIKE Not Observed 08/19/2022   SPEI Comment 08/19/2022     Chemistry      Component Value Date/Time   NA 140 10/06/2022 0935   NA 142 06/19/2021 1032   K 4.3 10/06/2022 0935   CL 102  10/06/2022 0935   CO2 27 10/06/2022 0935   BUN 26 (H) 10/06/2022 0935   BUN 12 06/19/2021 1032   CREATININE 1.38 (H) 10/06/2022 0935   CREATININE 1.01 03/26/2016 1009      Component Value Date/Time   CALCIUM 9.9 10/06/2022 0935   ALKPHOS 72 10/06/2022 0935   AST 15 10/06/2022 0935   ALT 22 10/06/2022 0935   BILITOT 0.4 10/06/2022 0935       Impression and Plan: John Hinton is a very pleasant 77 yo caucasian male with an atypical stage IV lung cancer which was previously resected followed by systemic chemotherapy and maintenance pembrolizumab.   When he recurred, we put him on Lumakras.  He could not tolerate this because of elevated liver function studies.  We then tried him on adagrasib.  He did okay with this.  However, I think it was apparent that his tumor broke through this.  He really is not a candidate for any additional therapy from my point of view.  I know that he is done a lot.  He does not want to go through side effects of the chemotherapy.  I totally agree with this.  Our goal has been a quality of life.  I know that Hospice will be able to help with this.  We will get him on the daily Lovenox now.  He is on twice daily Lovenox which he is doing but I think daily would be a whole lot easier.  Again, the weight loss is going to tell us how he is doing.  Of note, he is a DO NOT RESUSCITATE.  I talked to him about this  in the hospital.  Again I totally agree with this decision.  His wife is doing a fantastic job with him.  I know that she is very dedicated and very proactive.  She will call us if he has any problems.  I know she will call Hospice.  I would like to see him back in about 3 weeks just to see how things are going for him.  [name], MD 3/19/202412:39 PM

## 2022-10-06 NOTE — Progress Notes (Signed)
No injection per MD. Pt hospice

## 2022-10-07 ENCOUNTER — Ambulatory Visit: Payer: Medicare Other | Admitting: Physical Therapy

## 2022-10-07 ENCOUNTER — Other Ambulatory Visit: Payer: Self-pay | Admitting: Hematology and Oncology

## 2022-10-07 ENCOUNTER — Ambulatory Visit: Payer: Medicare Other | Admitting: Occupational Therapy

## 2022-10-08 DIAGNOSIS — C3492 Malignant neoplasm of unspecified part of left bronchus or lung: Secondary | ICD-10-CM | POA: Diagnosis not present

## 2022-10-08 DIAGNOSIS — C7951 Secondary malignant neoplasm of bone: Secondary | ICD-10-CM | POA: Diagnosis not present

## 2022-10-08 DIAGNOSIS — Z9225 Personal history of immunosupression therapy: Secondary | ICD-10-CM | POA: Diagnosis not present

## 2022-10-08 DIAGNOSIS — C7801 Secondary malignant neoplasm of right lung: Secondary | ICD-10-CM | POA: Diagnosis not present

## 2022-10-08 DIAGNOSIS — C7970 Secondary malignant neoplasm of unspecified adrenal gland: Secondary | ICD-10-CM | POA: Diagnosis not present

## 2022-10-08 DIAGNOSIS — I25119 Atherosclerotic heart disease of native coronary artery with unspecified angina pectoris: Secondary | ICD-10-CM | POA: Diagnosis not present

## 2022-10-14 ENCOUNTER — Other Ambulatory Visit: Payer: Self-pay | Admitting: Hematology & Oncology

## 2022-10-14 DIAGNOSIS — E039 Hypothyroidism, unspecified: Secondary | ICD-10-CM

## 2022-10-15 DIAGNOSIS — C3492 Malignant neoplasm of unspecified part of left bronchus or lung: Secondary | ICD-10-CM | POA: Diagnosis not present

## 2022-10-15 DIAGNOSIS — Z9225 Personal history of immunosupression therapy: Secondary | ICD-10-CM | POA: Diagnosis not present

## 2022-10-15 DIAGNOSIS — C7970 Secondary malignant neoplasm of unspecified adrenal gland: Secondary | ICD-10-CM | POA: Diagnosis not present

## 2022-10-15 DIAGNOSIS — C7951 Secondary malignant neoplasm of bone: Secondary | ICD-10-CM | POA: Diagnosis not present

## 2022-10-15 DIAGNOSIS — C7801 Secondary malignant neoplasm of right lung: Secondary | ICD-10-CM | POA: Diagnosis not present

## 2022-10-15 DIAGNOSIS — I25119 Atherosclerotic heart disease of native coronary artery with unspecified angina pectoris: Secondary | ICD-10-CM | POA: Diagnosis not present

## 2022-10-19 DIAGNOSIS — E785 Hyperlipidemia, unspecified: Secondary | ICD-10-CM | POA: Diagnosis not present

## 2022-10-19 DIAGNOSIS — I4510 Unspecified right bundle-branch block: Secondary | ICD-10-CM | POA: Diagnosis not present

## 2022-10-19 DIAGNOSIS — E8809 Other disorders of plasma-protein metabolism, not elsewhere classified: Secondary | ICD-10-CM | POA: Diagnosis not present

## 2022-10-19 DIAGNOSIS — Z9225 Personal history of immunosupression therapy: Secondary | ICD-10-CM | POA: Diagnosis not present

## 2022-10-19 DIAGNOSIS — C3492 Malignant neoplasm of unspecified part of left bronchus or lung: Secondary | ICD-10-CM | POA: Diagnosis not present

## 2022-10-19 DIAGNOSIS — I25119 Atherosclerotic heart disease of native coronary artery with unspecified angina pectoris: Secondary | ICD-10-CM | POA: Diagnosis not present

## 2022-10-19 DIAGNOSIS — I110 Hypertensive heart disease with heart failure: Secondary | ICD-10-CM | POA: Diagnosis not present

## 2022-10-19 DIAGNOSIS — C7951 Secondary malignant neoplasm of bone: Secondary | ICD-10-CM | POA: Diagnosis not present

## 2022-10-19 DIAGNOSIS — N400 Enlarged prostate without lower urinary tract symptoms: Secondary | ICD-10-CM | POA: Diagnosis not present

## 2022-10-19 DIAGNOSIS — I509 Heart failure, unspecified: Secondary | ICD-10-CM | POA: Diagnosis not present

## 2022-10-19 DIAGNOSIS — C7970 Secondary malignant neoplasm of unspecified adrenal gland: Secondary | ICD-10-CM | POA: Diagnosis not present

## 2022-10-19 DIAGNOSIS — C7801 Secondary malignant neoplasm of right lung: Secondary | ICD-10-CM | POA: Diagnosis not present

## 2022-10-19 DIAGNOSIS — I2699 Other pulmonary embolism without acute cor pulmonale: Secondary | ICD-10-CM | POA: Diagnosis not present

## 2022-10-19 DIAGNOSIS — Z87891 Personal history of nicotine dependence: Secondary | ICD-10-CM | POA: Diagnosis not present

## 2022-10-19 DIAGNOSIS — E039 Hypothyroidism, unspecified: Secondary | ICD-10-CM | POA: Diagnosis not present

## 2022-10-19 DIAGNOSIS — I4891 Unspecified atrial fibrillation: Secondary | ICD-10-CM | POA: Diagnosis not present

## 2022-10-19 DIAGNOSIS — I82409 Acute embolism and thrombosis of unspecified deep veins of unspecified lower extremity: Secondary | ICD-10-CM | POA: Diagnosis not present

## 2022-10-21 DIAGNOSIS — I25119 Atherosclerotic heart disease of native coronary artery with unspecified angina pectoris: Secondary | ICD-10-CM | POA: Diagnosis not present

## 2022-10-21 DIAGNOSIS — Z9225 Personal history of immunosupression therapy: Secondary | ICD-10-CM | POA: Diagnosis not present

## 2022-10-21 DIAGNOSIS — C7801 Secondary malignant neoplasm of right lung: Secondary | ICD-10-CM | POA: Diagnosis not present

## 2022-10-21 DIAGNOSIS — C3492 Malignant neoplasm of unspecified part of left bronchus or lung: Secondary | ICD-10-CM | POA: Diagnosis not present

## 2022-10-21 DIAGNOSIS — C7970 Secondary malignant neoplasm of unspecified adrenal gland: Secondary | ICD-10-CM | POA: Diagnosis not present

## 2022-10-21 DIAGNOSIS — C7951 Secondary malignant neoplasm of bone: Secondary | ICD-10-CM | POA: Diagnosis not present

## 2022-10-22 DIAGNOSIS — C3492 Malignant neoplasm of unspecified part of left bronchus or lung: Secondary | ICD-10-CM | POA: Diagnosis not present

## 2022-10-22 DIAGNOSIS — C7801 Secondary malignant neoplasm of right lung: Secondary | ICD-10-CM | POA: Diagnosis not present

## 2022-10-22 DIAGNOSIS — Z9225 Personal history of immunosupression therapy: Secondary | ICD-10-CM | POA: Diagnosis not present

## 2022-10-22 DIAGNOSIS — C7970 Secondary malignant neoplasm of unspecified adrenal gland: Secondary | ICD-10-CM | POA: Diagnosis not present

## 2022-10-22 DIAGNOSIS — I25119 Atherosclerotic heart disease of native coronary artery with unspecified angina pectoris: Secondary | ICD-10-CM | POA: Diagnosis not present

## 2022-10-22 DIAGNOSIS — C7951 Secondary malignant neoplasm of bone: Secondary | ICD-10-CM | POA: Diagnosis not present

## 2022-10-23 DIAGNOSIS — Z9225 Personal history of immunosupression therapy: Secondary | ICD-10-CM | POA: Diagnosis not present

## 2022-10-23 DIAGNOSIS — C7801 Secondary malignant neoplasm of right lung: Secondary | ICD-10-CM | POA: Diagnosis not present

## 2022-10-23 DIAGNOSIS — I25119 Atherosclerotic heart disease of native coronary artery with unspecified angina pectoris: Secondary | ICD-10-CM | POA: Diagnosis not present

## 2022-10-23 DIAGNOSIS — C3492 Malignant neoplasm of unspecified part of left bronchus or lung: Secondary | ICD-10-CM | POA: Diagnosis not present

## 2022-10-23 DIAGNOSIS — C7951 Secondary malignant neoplasm of bone: Secondary | ICD-10-CM | POA: Diagnosis not present

## 2022-10-23 DIAGNOSIS — C7970 Secondary malignant neoplasm of unspecified adrenal gland: Secondary | ICD-10-CM | POA: Diagnosis not present

## 2022-10-24 DIAGNOSIS — C7970 Secondary malignant neoplasm of unspecified adrenal gland: Secondary | ICD-10-CM | POA: Diagnosis not present

## 2022-10-24 DIAGNOSIS — C3492 Malignant neoplasm of unspecified part of left bronchus or lung: Secondary | ICD-10-CM | POA: Diagnosis not present

## 2022-10-24 DIAGNOSIS — C7801 Secondary malignant neoplasm of right lung: Secondary | ICD-10-CM | POA: Diagnosis not present

## 2022-10-24 DIAGNOSIS — Z9225 Personal history of immunosupression therapy: Secondary | ICD-10-CM | POA: Diagnosis not present

## 2022-10-24 DIAGNOSIS — I25119 Atherosclerotic heart disease of native coronary artery with unspecified angina pectoris: Secondary | ICD-10-CM | POA: Diagnosis not present

## 2022-10-24 DIAGNOSIS — C7951 Secondary malignant neoplasm of bone: Secondary | ICD-10-CM | POA: Diagnosis not present

## 2022-10-25 DIAGNOSIS — I25119 Atherosclerotic heart disease of native coronary artery with unspecified angina pectoris: Secondary | ICD-10-CM | POA: Diagnosis not present

## 2022-10-25 DIAGNOSIS — C7801 Secondary malignant neoplasm of right lung: Secondary | ICD-10-CM | POA: Diagnosis not present

## 2022-10-25 DIAGNOSIS — C3492 Malignant neoplasm of unspecified part of left bronchus or lung: Secondary | ICD-10-CM | POA: Diagnosis not present

## 2022-10-25 DIAGNOSIS — Z9225 Personal history of immunosupression therapy: Secondary | ICD-10-CM | POA: Diagnosis not present

## 2022-10-25 DIAGNOSIS — C7951 Secondary malignant neoplasm of bone: Secondary | ICD-10-CM | POA: Diagnosis not present

## 2022-10-25 DIAGNOSIS — C7970 Secondary malignant neoplasm of unspecified adrenal gland: Secondary | ICD-10-CM | POA: Diagnosis not present

## 2022-10-26 DIAGNOSIS — Z9225 Personal history of immunosupression therapy: Secondary | ICD-10-CM | POA: Diagnosis not present

## 2022-10-26 DIAGNOSIS — C7970 Secondary malignant neoplasm of unspecified adrenal gland: Secondary | ICD-10-CM | POA: Diagnosis not present

## 2022-10-26 DIAGNOSIS — I25119 Atherosclerotic heart disease of native coronary artery with unspecified angina pectoris: Secondary | ICD-10-CM | POA: Diagnosis not present

## 2022-10-26 DIAGNOSIS — C7801 Secondary malignant neoplasm of right lung: Secondary | ICD-10-CM | POA: Diagnosis not present

## 2022-10-26 DIAGNOSIS — C3492 Malignant neoplasm of unspecified part of left bronchus or lung: Secondary | ICD-10-CM | POA: Diagnosis not present

## 2022-10-26 DIAGNOSIS — C7951 Secondary malignant neoplasm of bone: Secondary | ICD-10-CM | POA: Diagnosis not present

## 2022-10-27 ENCOUNTER — Encounter: Payer: Self-pay | Admitting: Hematology & Oncology

## 2022-11-09 ENCOUNTER — Inpatient Hospital Stay: Payer: Medicare Other

## 2022-11-09 ENCOUNTER — Ambulatory Visit: Payer: Medicare Other | Admitting: Hematology & Oncology

## 2022-11-10 ENCOUNTER — Other Ambulatory Visit (HOSPITAL_COMMUNITY): Payer: Self-pay

## 2022-11-18 DEATH — deceased

## 2022-11-27 ENCOUNTER — Ambulatory Visit: Payer: Medicare Other | Admitting: Cardiology

## 2022-12-10 ENCOUNTER — Other Ambulatory Visit: Payer: Self-pay | Admitting: Cardiology

## 2023-02-02 ENCOUNTER — Ambulatory Visit: Payer: Medicare Other | Admitting: Cardiology
